# Patient Record
Sex: Male | Born: 1955 | Race: White | Hispanic: No | Marital: Married | Smoking: Former smoker
Health system: Southern US, Community
[De-identification: ages and names within clinical notes are randomized; demographics above are authoritative.]

## PROBLEM LIST (undated history)

## (undated) DIAGNOSIS — G4730 Sleep apnea, unspecified: Secondary | ICD-10-CM

## (undated) DIAGNOSIS — K7030 Alcoholic cirrhosis of liver without ascites: Secondary | ICD-10-CM

## (undated) DIAGNOSIS — E785 Hyperlipidemia, unspecified: Secondary | ICD-10-CM

## (undated) DIAGNOSIS — T8859XA Other complications of anesthesia, initial encounter: Secondary | ICD-10-CM

## (undated) DIAGNOSIS — J449 Chronic obstructive pulmonary disease, unspecified: Secondary | ICD-10-CM

## (undated) DIAGNOSIS — I10 Essential (primary) hypertension: Secondary | ICD-10-CM

## (undated) DIAGNOSIS — I739 Peripheral vascular disease, unspecified: Secondary | ICD-10-CM

## (undated) DIAGNOSIS — R7303 Prediabetes: Secondary | ICD-10-CM

## (undated) DIAGNOSIS — I509 Heart failure, unspecified: Secondary | ICD-10-CM

## (undated) DIAGNOSIS — M1A00X0 Idiopathic chronic gout, unspecified site, without tophus (tophi): Secondary | ICD-10-CM

## (undated) DIAGNOSIS — E790 Hyperuricemia without signs of inflammatory arthritis and tophaceous disease: Secondary | ICD-10-CM

## (undated) DIAGNOSIS — D689 Coagulation defect, unspecified: Secondary | ICD-10-CM

## (undated) DIAGNOSIS — M1A9XX0 Chronic gout, unspecified, without tophus (tophi): Secondary | ICD-10-CM

## (undated) DIAGNOSIS — D649 Anemia, unspecified: Secondary | ICD-10-CM

## (undated) DIAGNOSIS — N183 Chronic kidney disease, stage 3 unspecified: Secondary | ICD-10-CM

## (undated) DIAGNOSIS — H269 Unspecified cataract: Secondary | ICD-10-CM

## (undated) DIAGNOSIS — C61 Malignant neoplasm of prostate: Secondary | ICD-10-CM

## (undated) HISTORY — DX: Anemia, unspecified: D64.9

## (undated) HISTORY — DX: Coagulation defect, unspecified: D68.9

## (undated) HISTORY — DX: Peripheral vascular disease, unspecified: I73.9

## (undated) HISTORY — PX: OTHER SURGICAL HISTORY: SHX169

## (undated) HISTORY — PX: ORIF ANKLE FRACTURE: SUR919

## (undated) HISTORY — PX: CARDIAC CATHETERIZATION: SHX172

## (undated) HISTORY — DX: Unspecified cataract: H26.9

## (undated) HISTORY — PX: COLONOSCOPY: SHX174

## (undated) HISTORY — PX: EYE SURGERY: SHX253

## (undated) HISTORY — DX: Malignant neoplasm of prostate: C61

---

## 2001-11-07 ENCOUNTER — Emergency Department (HOSPITAL_COMMUNITY): Admission: EM | Admit: 2001-11-07 | Discharge: 2001-11-07 | Payer: Self-pay

## 2004-08-11 ENCOUNTER — Ambulatory Visit: Payer: Self-pay | Admitting: Anesthesiology

## 2004-08-29 ENCOUNTER — Ambulatory Visit: Payer: Self-pay | Admitting: Anesthesiology

## 2004-09-29 ENCOUNTER — Ambulatory Visit: Payer: Self-pay | Admitting: Anesthesiology

## 2004-10-27 ENCOUNTER — Ambulatory Visit: Payer: Self-pay | Admitting: Anesthesiology

## 2004-11-27 ENCOUNTER — Ambulatory Visit: Payer: Self-pay | Admitting: Anesthesiology

## 2007-12-28 ENCOUNTER — Ambulatory Visit: Payer: Self-pay | Admitting: Oncology

## 2008-01-01 ENCOUNTER — Ambulatory Visit: Payer: Self-pay | Admitting: Oncology

## 2008-01-28 ENCOUNTER — Ambulatory Visit: Payer: Self-pay | Admitting: Oncology

## 2008-08-18 ENCOUNTER — Ambulatory Visit: Payer: Self-pay | Admitting: Oncology

## 2008-08-29 ENCOUNTER — Ambulatory Visit: Payer: Self-pay | Admitting: Oncology

## 2015-07-15 ENCOUNTER — Other Ambulatory Visit: Payer: Self-pay | Admitting: Family Medicine

## 2016-08-29 DIAGNOSIS — Z86010 Personal history of colonic polyps: Secondary | ICD-10-CM | POA: Insufficient documentation

## 2017-06-28 ENCOUNTER — Encounter: Payer: Self-pay | Admitting: *Deleted

## 2017-06-28 ENCOUNTER — Emergency Department: Payer: Medicare Other

## 2017-06-28 ENCOUNTER — Emergency Department
Admission: EM | Admit: 2017-06-28 | Discharge: 2017-06-28 | Disposition: A | Discharge status: Home / Self Care | Payer: Medicare Other | Attending: Emergency Medicine | Admitting: Emergency Medicine

## 2017-06-28 DIAGNOSIS — J449 Chronic obstructive pulmonary disease, unspecified: Secondary | ICD-10-CM | POA: Insufficient documentation

## 2017-06-28 DIAGNOSIS — R55 Syncope and collapse: Secondary | ICD-10-CM

## 2017-06-28 DIAGNOSIS — I509 Heart failure, unspecified: Secondary | ICD-10-CM | POA: Insufficient documentation

## 2017-06-28 HISTORY — DX: Essential (primary) hypertension: I10

## 2017-06-28 HISTORY — DX: Heart failure, unspecified: I50.9

## 2017-06-28 HISTORY — DX: Chronic obstructive pulmonary disease, unspecified: J44.9

## 2017-06-28 LAB — CBC WITH DIFFERENTIAL/PLATELET
BASOS PCT: 1 %
Basophils Absolute: 0.1 10*3/uL (ref 0–0.1)
EOS ABS: 0.2 10*3/uL (ref 0–0.7)
EOS PCT: 3 %
HCT: 36.1 % — ABNORMAL LOW (ref 40.0–52.0)
HEMOGLOBIN: 12.4 g/dL — AB (ref 13.0–18.0)
LYMPHS ABS: 1.9 10*3/uL (ref 1.0–3.6)
Lymphocytes Relative: 21 %
MCH: 35 pg — AB (ref 26.0–34.0)
MCHC: 34.5 g/dL (ref 32.0–36.0)
MCV: 101.6 fL — ABNORMAL HIGH (ref 80.0–100.0)
MONO ABS: 0.8 10*3/uL (ref 0.2–1.0)
MONOS PCT: 9 %
NEUTROS PCT: 66 %
Neutro Abs: 6 10*3/uL (ref 1.4–6.5)
PLATELETS: 223 10*3/uL (ref 150–440)
RBC: 3.55 MIL/uL — ABNORMAL LOW (ref 4.40–5.90)
RDW: 13.6 % (ref 11.5–14.5)
WBC: 8.9 10*3/uL (ref 3.8–10.6)

## 2017-06-28 LAB — COMPREHENSIVE METABOLIC PANEL
ALBUMIN: 3.4 g/dL — AB (ref 3.5–5.0)
ALK PHOS: 48 U/L (ref 38–126)
ALT: 13 U/L — ABNORMAL LOW (ref 17–63)
ANION GAP: 6 (ref 5–15)
AST: 22 U/L (ref 15–41)
BUN: 19 mg/dL (ref 6–20)
CALCIUM: 9 mg/dL (ref 8.9–10.3)
CHLORIDE: 104 mmol/L (ref 101–111)
CO2: 28 mmol/L (ref 22–32)
Creatinine, Ser: 1.6 mg/dL — ABNORMAL HIGH (ref 0.61–1.24)
GFR calc non Af Amer: 46 mL/min — ABNORMAL LOW (ref >60)
GFR, EST AFRICAN AMERICAN: 53 mL/min — AB (ref >60)
GLUCOSE: 102 mg/dL — AB (ref 65–99)
POTASSIUM: 3.8 mmol/L (ref 3.5–5.1)
SODIUM: 138 mmol/L (ref 135–145)
Total Bilirubin: 0.7 mg/dL (ref 0.3–1.2)
Total Protein: 7.7 g/dL (ref 6.5–8.1)

## 2017-06-28 LAB — BLOOD GAS, VENOUS
Acid-Base Excess: 3.8 mmol/L — ABNORMAL HIGH (ref 0.0–2.0)
BICARBONATE: 29.7 mmol/L — AB (ref 20.0–28.0)
O2 SAT: 60.9 %
PATIENT TEMPERATURE: 37
PCO2 VEN: 49 mmHg (ref 44.0–60.0)
PO2 VEN: 32 mmHg (ref 32.0–45.0)
pH, Ven: 7.39 (ref 7.250–7.430)

## 2017-06-28 LAB — BRAIN NATRIURETIC PEPTIDE: B NATRIURETIC PEPTIDE 5: 70 pg/mL (ref 0.0–100.0)

## 2017-06-28 LAB — TROPONIN I: Troponin I: <0.03 ng/mL (ref <0.03)

## 2017-06-28 MED ORDER — LORAZEPAM 1 MG PO TABS: 1.0000 mg | ORAL_TABLET | Freq: Two times a day (BID) | ORAL | 0 refills | Status: DC

## 2017-06-28 NOTE — ED Notes (Signed)
ED Provider at bedside. 

## 2017-06-28 NOTE — ED Notes (Signed)
Patient ambulated with this nurse down around the nurse's station while monitoring oxygen saturation.  Patient ambulated with steady gait, O2 sat maintained 100% RA, and heart rate 79.

## 2017-06-28 NOTE — ED Provider Notes (Signed)
[facility] Emergency Department Provider Note       Time seen: ----------------------------------------- 6:17 PM on 06/28/2017 -----------------------------------------     I have reviewed the triage vital signs and the nursing notes.   HISTORY   Chief Complaint No chief complaint on file.    HPI Kevin Gross is a 61 y.o. male with a history of CHF and COPD who presents to the ED for near syncope today.  Patient states he was sitting in his when he began feeling near syncopal.  Patient states she has not had any recent illness, no recent changes in his medications or other complaints.  He has not had this happen before, nothing makes it better or worse.  Vital signs are unremarkable in route.  No past medical history on file.  There are no active problems to display for this patient.   No past surgical history on file.  Allergies Patient has no allergy information on record.  Social History Social History  Substance Use Topics  . Smoking status: Not on file  . Smokeless tobacco: Not on file  . Alcohol use Not on file    Review of Systems Constitutional: Negative for fever. Cardiovascular: Negative for chest pain. Respiratory: Negative for shortness of breath. Gastrointestinal: Negative for abdominal pain, vomiting and diarrhea. Genitourinary: Negative for dysuria. Musculoskeletal: Negative for back pain. Skin: Negative for rash. Neurological: Negative for headaches, positive for weakness  All systems negative/normal/unremarkable except as stated in the HPI  ____________________________________________   PHYSICAL EXAM:  VITAL SIGNS: ED Triage Vitals  Enc Vitals Group     BP      Pulse      Resp      Temp      Temp src      SpO2      Weight      Height      Head Circumference      Peak Flow      Pain Score      Pain Loc      Pain Edu?      Excl. in [place]?     Constitutional: Alert and oriented.  Anxious, mild  distress Eyes: Conjunctivae are normal. Normal extraocular movements. ENT   Head: Normocephalic and atraumatic.   Nose: No congestion/rhinnorhea.   Mouth/Throat: Mucous membranes are moist.   Neck: No stridor. Cardiovascular: Normal rate, regular rhythm. No murmurs, rubs, or gallops. Respiratory: Normal respiratory effort without tachypnea nor retractions. Breath sounds are clear and equal bilaterally. No wheezes/rales/rhonchi. Gastrointestinal: Soft and nontender. Normal bowel sounds Musculoskeletal: Nontender with normal range of motion in extremities. No lower extremity tenderness nor edema. Neurologic:  Normal speech and language. No gross focal neurologic deficits are appreciated.  Skin:  Skin is warm, dry and intact. No rash noted. Psychiatric: Mood and affect are normal. Speech and behavior are normal.  ____________________________________________  EKG: Interpreted by me.  Sinus rhythm rate of 63 bpm, normal PR interval, normal QRS, normal QT.  Baseline artifact  ____________________________________________  ED COURSE:  Pertinent labs & imaging results that were available during my care of the patient were reviewed by me and considered in my medical decision making (see chart for details). Patient presents for near-syncope, we will assess with labs and imaging as indicated.   Procedures ____________________________________________   LABS (pertinent positives/negatives)  Labs Reviewed  CBC WITH DIFFERENTIAL/PLATELET - Abnormal; Notable for the following:       Result Value   RBC 3.55 (*)  Hemoglobin 12.4 (*)    HCT 36.1 (*)    MCV 101.6 (*)    MCH 35.0 (*)    All other components within normal limits  COMPREHENSIVE METABOLIC PANEL - Abnormal; Notable for the following:    Glucose, Bld 102 (*)    Creatinine, Ser 1.60 (*)    Albumin 3.4 (*)    ALT 13 (*)    GFR calc non Af Amer 46 (*)    GFR calc Af Amer 53 (*)    All other components within normal  limits  BLOOD GAS, VENOUS - Abnormal; Notable for the following:    Bicarbonate 29.7 (*)    Acid-Base Excess 3.8 (*)    All other components within normal limits  BRAIN NATRIURETIC PEPTIDE  TROPONIN I  CBG MONITORING, ED    RADIOLOGY  chest x-ray IMPRESSION: No acute cardiopulmonary disease.  ____________________________________________  DIFFERENTIAL DIAGNOSIS   Arrhythmia, pneumonia, COPD, CHF, hypercarbic respiratory failure  FINAL ASSESSMENT AND PLAN  Near syncope   Plan: Patient had presented for near syncope of uncertain etiology. Patients labs did indicate a slightly elevated creatinine. Patients imaging was not concerning.  Currently states he feels better.  This may have in fact been anxiety related.  Overall he is asymptomatic and I will prescribe some Ativan to try as needed.  He is stable for outpatient follow-up.   Earleen Newport, MD   Note: This note was generated in part or whole with voice recognition software. Voice recognition is usually quite accurate but there are transcription errors that can and very often do occur. I apologize for any typographical errors that were not detected and corrected.     Earleen Newport, MD 06/28/17 [phone]

## 2017-11-29 ENCOUNTER — Other Ambulatory Visit: Payer: Self-pay | Admitting: Family

## 2017-11-29 ENCOUNTER — Telehealth: Payer: Self-pay | Admitting: *Deleted

## 2017-11-29 DIAGNOSIS — R062 Wheezing: Secondary | ICD-10-CM

## 2017-11-29 DIAGNOSIS — Z87891 Personal history of nicotine dependence: Secondary | ICD-10-CM

## 2017-11-29 DIAGNOSIS — R0600 Dyspnea, unspecified: Secondary | ICD-10-CM

## 2017-11-29 DIAGNOSIS — Z122 Encounter for screening for malignant neoplasm of respiratory organs: Secondary | ICD-10-CM

## 2017-11-29 NOTE — Telephone Encounter (Signed)
Received referral for initial lung cancer screening scan. Contacted patient and obtained smoking history,(former, quit 2016, 47 pack year) as well as answering questions related to screening process. Patient denies signs of lung cancer such as weight loss or hemoptysis. Patient denies comorbidity that would prevent curative treatment if lung cancer were found. Patient is scheduled for shared decision making visit and CT scan on 12/12/17.

## 2017-12-12 ENCOUNTER — Inpatient Hospital Stay: Payer: Medicare PPO | Attending: Oncology | Admitting: Oncology

## 2017-12-12 ENCOUNTER — Ambulatory Visit
Admission: RE | Admit: 2017-12-12 | Discharge: 2017-12-12 | Disposition: A | Discharge status: Home / Self Care | Payer: Medicare PPO | Source: Ambulatory Visit | Attending: Oncology | Admitting: Oncology

## 2017-12-12 ENCOUNTER — Encounter: Payer: Self-pay | Admitting: Oncology

## 2017-12-12 DIAGNOSIS — Z87891 Personal history of nicotine dependence: Secondary | ICD-10-CM

## 2017-12-12 DIAGNOSIS — Z122 Encounter for screening for malignant neoplasm of respiratory organs: Secondary | ICD-10-CM | POA: Insufficient documentation

## 2017-12-12 DIAGNOSIS — I700 Atherosclerosis of aorta: Secondary | ICD-10-CM | POA: Insufficient documentation

## 2017-12-12 DIAGNOSIS — I2510 Atherosclerotic heart disease of native coronary artery without angina pectoris: Secondary | ICD-10-CM | POA: Insufficient documentation

## 2017-12-12 DIAGNOSIS — J432 Centrilobular emphysema: Secondary | ICD-10-CM | POA: Insufficient documentation

## 2017-12-12 NOTE — Progress Notes (Signed)
In accordance with CMS guidelines, patient has met eligibility criteria including age, absence of signs or symptoms of lung cancer.  Social History   Tobacco Use  . Smoking status: Former Smoker    Packs/day: 1.00    Years: 47.00    Pack years: 47.00    Last attempt to quit: 2016    Years since quitting: 3.2  . Smokeless tobacco: Never Used  Substance Use Topics  . Alcohol use: Yes    Comment: occasionally  . Drug use: No     A shared decision-making session was conducted prior to the performance of CT scan. This includes one or more decision aids, includes benefits and harms of screening, follow-up diagnostic testing, over-diagnosis, false positive rate, and total radiation exposure.  Counseling on the importance of adherence to annual lung cancer LDCT screening, impact of co-morbidities, and ability or willingness to undergo diagnosis and treatment is imperative for compliance of the program.  Counseling on the importance of continued smoking cessation for former smokers; the importance of smoking cessation for current smokers, and information about tobacco cessation interventions have been given to patient including [place] and 1800 quit [place] programs.  Written order for lung cancer screening with LDCT has been given to the patient and any and all questions have been answered to the best of my abilities.   Yearly follow up will be coordinated by Burgess Estelle, Thoracic Navigator.  Faythe Casa, NP 12/12/2017 1:02 PM

## 2017-12-15 ENCOUNTER — Encounter: Payer: Self-pay | Admitting: *Deleted

## 2018-01-16 ENCOUNTER — Encounter: Payer: Self-pay | Admitting: *Deleted

## 2018-01-17 ENCOUNTER — Encounter
Admission: RE | Disposition: A | Discharge status: Home / Self Care | Payer: Self-pay | Source: Ambulatory Visit | Attending: Internal Medicine

## 2018-01-17 ENCOUNTER — Other Ambulatory Visit: Payer: Self-pay

## 2018-01-17 ENCOUNTER — Ambulatory Visit
Admission: RE | Admit: 2018-01-17 | Discharge: 2018-01-17 | Disposition: A | Discharge status: Home / Self Care | Payer: Medicare PPO | Source: Ambulatory Visit | Attending: Internal Medicine | Admitting: Internal Medicine

## 2018-01-17 ENCOUNTER — Encounter: Payer: Self-pay | Admitting: *Deleted

## 2018-01-17 ENCOUNTER — Ambulatory Visit: Payer: Medicare PPO | Admitting: Anesthesiology

## 2018-01-17 DIAGNOSIS — K3189 Other diseases of stomach and duodenum: Secondary | ICD-10-CM | POA: Diagnosis not present

## 2018-01-17 DIAGNOSIS — D123 Benign neoplasm of transverse colon: Secondary | ICD-10-CM | POA: Diagnosis not present

## 2018-01-17 DIAGNOSIS — Z86010 Personal history of colonic polyps: Secondary | ICD-10-CM | POA: Insufficient documentation

## 2018-01-17 DIAGNOSIS — D125 Benign neoplasm of sigmoid colon: Secondary | ICD-10-CM | POA: Insufficient documentation

## 2018-01-17 DIAGNOSIS — D120 Benign neoplasm of cecum: Secondary | ICD-10-CM | POA: Diagnosis not present

## 2018-01-17 DIAGNOSIS — I110 Hypertensive heart disease with heart failure: Secondary | ICD-10-CM | POA: Diagnosis not present

## 2018-01-17 DIAGNOSIS — E785 Hyperlipidemia, unspecified: Secondary | ICD-10-CM | POA: Diagnosis not present

## 2018-01-17 DIAGNOSIS — K640 First degree hemorrhoids: Secondary | ICD-10-CM | POA: Insufficient documentation

## 2018-01-17 DIAGNOSIS — Z79899 Other long term (current) drug therapy: Secondary | ICD-10-CM | POA: Insufficient documentation

## 2018-01-17 DIAGNOSIS — Z1211 Encounter for screening for malignant neoplasm of colon: Secondary | ICD-10-CM | POA: Insufficient documentation

## 2018-01-17 DIAGNOSIS — Z7982 Long term (current) use of aspirin: Secondary | ICD-10-CM | POA: Diagnosis not present

## 2018-01-17 DIAGNOSIS — J449 Chronic obstructive pulmonary disease, unspecified: Secondary | ICD-10-CM | POA: Insufficient documentation

## 2018-01-17 DIAGNOSIS — I509 Heart failure, unspecified: Secondary | ICD-10-CM | POA: Insufficient documentation

## 2018-01-17 DIAGNOSIS — K269 Duodenal ulcer, unspecified as acute or chronic, without hemorrhage or perforation: Secondary | ICD-10-CM | POA: Diagnosis not present

## 2018-01-17 DIAGNOSIS — R1310 Dysphagia, unspecified: Secondary | ICD-10-CM | POA: Insufficient documentation

## 2018-01-17 DIAGNOSIS — Z6841 Body Mass Index (BMI) 40.0 and over, adult: Secondary | ICD-10-CM | POA: Diagnosis not present

## 2018-01-17 DIAGNOSIS — K5730 Diverticulosis of large intestine without perforation or abscess without bleeding: Secondary | ICD-10-CM | POA: Insufficient documentation

## 2018-01-17 HISTORY — PX: COLONOSCOPY WITH PROPOFOL: SHX5780

## 2018-01-17 HISTORY — DX: Hyperlipidemia, unspecified: E78.5

## 2018-01-17 HISTORY — PX: ESOPHAGOGASTRODUODENOSCOPY (EGD) WITH PROPOFOL: SHX5813

## 2018-01-17 SURGERY — COLONOSCOPY WITH PROPOFOL
Anesthesia: General

## 2018-01-17 MED ORDER — PROPOFOL 500 MG/50ML IV EMUL
INTRAVENOUS | Status: AC
  Filled 2018-01-17: qty 50

## 2018-01-17 MED ORDER — PROPOFOL 500 MG/50ML IV EMUL
INTRAVENOUS | Status: DC | PRN
  Administered 2018-01-17: 100 ug/kg/min via INTRAVENOUS

## 2018-01-17 MED ORDER — ONDANSETRON HCL 4 MG/2ML IJ SOLN
INTRAMUSCULAR | Status: DC | PRN
  Administered 2018-01-17: 4 mg via INTRAVENOUS

## 2018-01-17 MED ORDER — GLYCOPYRROLATE 0.2 MG/ML IJ SOLN
INTRAMUSCULAR | Status: AC
  Filled 2018-01-17: qty 1

## 2018-01-17 MED ORDER — PROPOFOL 10 MG/ML IV BOLUS
INTRAVENOUS | Status: AC
  Filled 2018-01-17: qty 20

## 2018-01-17 MED ORDER — SODIUM CHLORIDE 0.9 % IV SOLN
INTRAVENOUS | Status: DC
  Administered 2018-01-17: 10:00:00 via INTRAVENOUS

## 2018-01-17 MED ORDER — LIDOCAINE HCL (CARDIAC) PF 100 MG/5ML IV SOSY
PREFILLED_SYRINGE | INTRAVENOUS | Status: DC | PRN
  Administered 2018-01-17: 50 mg via INTRAVENOUS

## 2018-01-17 MED ORDER — FENTANYL CITRATE (PF) 100 MCG/2ML IJ SOLN
INTRAMUSCULAR | Status: AC
  Filled 2018-01-17: qty 2

## 2018-01-17 MED ORDER — GLYCOPYRROLATE 0.2 MG/ML IJ SOLN
INTRAMUSCULAR | Status: DC | PRN
  Administered 2018-01-17 (×2): 0.1 mg via INTRAVENOUS

## 2018-01-17 MED ORDER — FENTANYL CITRATE (PF) 100 MCG/2ML IJ SOLN
INTRAMUSCULAR | Status: DC | PRN
  Administered 2018-01-17 (×2): 50 ug via INTRAVENOUS

## 2018-01-17 MED ORDER — PHENYLEPHRINE HCL 10 MG/ML IJ SOLN
INTRAMUSCULAR | Status: DC | PRN
  Administered 2018-01-17 (×3): 200 ug via INTRAVENOUS

## 2018-01-17 MED ORDER — PROPOFOL 10 MG/ML IV BOLUS
INTRAVENOUS | Status: DC | PRN
  Administered 2018-01-17 (×3): 50 mg via INTRAVENOUS

## 2018-01-17 MED ORDER — LIDOCAINE HCL (PF) 2 % IJ SOLN
INTRAMUSCULAR | Status: AC
  Filled 2018-01-17: qty 10

## 2018-01-17 NOTE — Op Note (Signed)
[facility]astroenterology Patient Name: Kevin Gross Procedure Date: 01/17/2018 10:41 AM MRN: [id number] Account #: [id number] Date of Birth: 1956/01/31 Admit Type: Outpatient Age: 62 Room: [facility] ENDO ROOM 2 Gender: Male Note Status: Finalized Procedure:            Colonoscopy Indications:          High risk colon cancer surveillance: Personal history                        of colonic polyps Providers:            Benay Pike. Alice Reichert MD, MD Referring MD:         Park Breed. Poindexter, Technician (Referring MD) Medicines:            Propofol per Anesthesia Complications:        No immediate complications. Procedure:            Pre-Anesthesia Assessment:                       - The risks and benefits of the procedure and the                        sedation options and risks were discussed with the                        patient. All questions were answered and informed                        consent was obtained.                       - Patient identification and proposed procedure were                        verified prior to the procedure by the nurse. The                        procedure was verified in the procedure room.                       - ASA Grade Assessment: III - A patient with severe                        systemic disease.                       - After reviewing the risks and benefits, the patient                        was deemed in satisfactory condition to undergo the                        procedure.                       After obtaining informed consent, the colonoscope was                        passed under direct vision. Throughout the procedure,  the patient's blood pressure, pulse, and oxygen                        saturations were monitored continuously. The                        Colonoscope was introduced through the anus and                        advanced to the the cecum, identified by appendiceal              orifice and ileocecal valve. The colonoscopy was                        performed without difficulty. The patient tolerated the                        procedure well. The quality of the bowel preparation                        was adequate to identify polyps 6 mm and larger in                        size. The ileocecal valve, appendiceal orifice, and                        rectum were photographed. Findings:      The perianal and digital rectal examinations were normal. Pertinent       negatives include normal sphincter tone and no palpable rectal lesions.      Many small-mouthed diverticula were found in the sigmoid colon.      A 3 mm polyp was found in the cecum. The polyp was sessile. The polyp       was removed with a jumbo cold forceps. Resection and retrieval were       complete.      A 4 mm polyp was found in the distal transverse colon. The polyp was       sessile. The polyp was removed with a jumbo cold forceps. Resection and       retrieval were complete.      A 3 mm polyp was found in the sigmoid colon. The polyp was sessile. The       polyp was removed with a jumbo cold forceps. Resection and retrieval       were complete.      Non-bleeding internal hemorrhoids were found during retroflexion. The       hemorrhoids were Grade I (internal hemorrhoids that do not prolapse).      The exam was otherwise without abnormality. Impression:           - Diverticulosis in the sigmoid colon.                       - One 3 mm polyp in the cecum, removed with a jumbo                        cold forceps. Resected and retrieved.                       - One 4 mm polyp in the distal transverse colon,  removed with a jumbo cold forceps. Resected and                        retrieved.                       - One 3 mm polyp in the sigmoid colon, removed with a                        jumbo cold forceps. Resected and retrieved.                       - Non-bleeding  internal hemorrhoids.                       - The examination was otherwise normal. Recommendation:       - Patient has a contact number available for                        emergencies. The signs and symptoms of potential                        delayed complications were discussed with the patient.                        Return to normal activities tomorrow. Written discharge                        instructions were provided to the patient.                       - Resume previous diet.                       - Continue present medications.                       - Repeat colonoscopy is recommended for surveillance.                        The colonoscopy date will be determined after pathology                        results from today's exam become available for review.                       - Return to GI office PRN.                       - The findings and recommendations were discussed with                        the patient and their spouse. Procedure Code(s):    --- Professional ---                       [phone], Colonoscopy, flexible; with biopsy, single or                        multiple Diagnosis Code(s):    --- Professional ---  K57.30, Diverticulosis of large intestine without                        perforation or abscess without bleeding                       D12.5, Benign neoplasm of sigmoid colon                       D12.3, Benign neoplasm of transverse colon (hepatic                        flexure or splenic flexure)                       D12.0, Benign neoplasm of cecum                       K64.0, First degree hemorrhoids                       Z86.010, Personal history of colonic polyps CPT copyright 2017 American Medical Association. All rights reserved. The codes documented in this report are preliminary and upon coder review may  be revised to meet current compliance requirements. Efrain Sella MD, MD 01/17/2018 11:21:01 AM This report has been  signed electronically. Number of Addenda: 0 Note Initiated On: 01/17/2018 10:41 AM Scope Withdrawal Time: 0 hours 9 minutes 46 seconds  Total Procedure Duration: 0 hours 13 minutes 24 seconds       [facility]

## 2018-01-17 NOTE — Transfer of Care (Signed)
Immediate Anesthesia Transfer of Care Note  Patient: Kevin Gross  Procedure(s) Performed: COLONOSCOPY WITH PROPOFOL (N/A ) ESOPHAGOGASTRODUODENOSCOPY (EGD) WITH PROPOFOL (N/A )  Patient Location: PACU and Endoscopy Unit  Anesthesia Type:General  Level of Consciousness: awake, alert , oriented and patient cooperative  Airway & Oxygen Therapy: Patient Spontanous Breathing  Post-op Assessment: Report given to RN, Post -op Vital signs reviewed and stable and Patient moving all extremities  Post vital signs: Reviewed and stable  Last Vitals:  Vitals Value Taken Time  BP 90/55 01/17/2018 11:23 AM  Temp 36.6 C 01/17/2018 11:21 AM  Pulse 83 01/17/2018 11:23 AM  Resp 14 01/17/2018 11:23 AM  SpO2 95 % 01/17/2018 11:23 AM  Vitals shown include unvalidated device data.  Last Pain:  Vitals:   01/17/18 1121  TempSrc: Tympanic  PainSc:          Complications: No apparent anesthesia complications

## 2018-01-17 NOTE — H&P (Signed)
Outpatient short stay form Pre-procedure 01/17/2018 10:42 AM Kevin Gross, M.D.  Primary Physician: Kevin Gross, M.D.  Reason for visit:  Dysphagia, personal history of colon polyps  History of present illness:  Patient's pleasant 62 year old male with a personal history of colon polyps on colonoscopy May 2018. He apparently had about 20 polyps removed it was diagnosed presumptively with a "polyposis syndrome" or some sort. Patient denies any rectal bleeding or weight loss. He has intermittent pill dysphagia without true food or liquid dysphagia. No significant heartburn.    Current Facility-Administered Medications:  .  0.9 %  sodium chloride infusion, , Intravenous, Continuous, Carnot-Moon, Benay Pike, MD, Last Rate: 20 mL/hr at 01/17/18 1610  Medications Prior to Admission  Medication Sig Dispense Refill Last Dose  . aspirin EC 81 MG tablet Take 81 mg by mouth daily.   01/16/2018 at 0800  . atenolol-chlorthalidone (TENORETIC) 50-25 MG tablet Take 1 tablet by mouth daily.   01/17/2018 at 0700  . lisinopril (PRINIVIL,ZESTRIL) 10 MG tablet Take 10 mg by mouth daily.   01/17/2018 at 0700  . simvastatin (ZOCOR) 40 MG tablet Take 40 mg by mouth daily.   01/17/2018 at 700  . zolpidem (AMBIEN) 10 MG tablet Take 10 mg by mouth at bedtime as needed for sleep.   01/16/2018 at 2200  . albuterol (PROVENTIL HFA;VENTOLIN HFA) 108 (90 Base) MCG/ACT inhaler Inhale into the lungs every 6 (six) hours as needed for wheezing or shortness of breath.   Not Taking at Unknown time  . LORazepam (ATIVAN) 1 MG tablet Take 1 tablet (1 mg total) by mouth 2 (two) times daily. (Patient not taking: Reported on 01/17/2018) 20 tablet 0 Not Taking at Unknown time     No Known Allergies   Past Medical History:  Diagnosis Date  . CHF (congestive heart failure) ([place])   . COPD (chronic obstructive pulmonary disease) ([place])   . Hyperlipidemia   . Hypertension     Review of systems:   Otherwise negative.   Physical  Exam  Gen: Alert, oriented. Appears stated age.  HEENT: [place]/AT. PERRLA. Lungs: CTA, no wheezes. CV: RR nl S1, S2. Abd: soft, benign, no masses. BS+ Ext: No edema. Pulses 2+    Planned procedures: Proceed with EGD and colonoscopy. The patient understands the nature of the planned procedure, indications, risks, alternatives and potential complications including but not limited to bleeding, infection, perforation, damage to internal organs and possible oversedation/side effects from anesthesia. The patient agrees and gives consent to proceed.  Please refer to procedure notes for findings, recommendations and patient disposition/instructions.    Kevin Gross, M.D. Gastroenterology 01/17/2018  10:42 AM

## 2018-01-17 NOTE — Anesthesia Preprocedure Evaluation (Addendum)
Anesthesia Evaluation  Patient identified by MRN, date of birth, ID band Patient awake    Reviewed: Allergy & Precautions, H&P , NPO status , Patient's Chart, lab work & pertinent test results, reviewed documented beta blocker date and time   History of Anesthesia Complications Negative for: history of anesthetic complications  Airway Mallampati: III  TM Distance: >3 FB Neck ROM: full    Dental  (+) Dental Advidsory Given, Loose, Poor Dentition   Pulmonary shortness of breath and with exertion, sleep apnea and Continuous Positive Airway Pressure Ventilation , COPD, neg recent URI, former smoker,           Cardiovascular Exercise Tolerance: Good hypertension, (-) angina+CHF  (-) CAD, (-) Past MI, (-) Cardiac Stents and (-) CABG negative cardio ROS  (-) dysrhythmias (-) Valvular Problems/Murmurs     Neuro/Psych negative neurological ROS  negative psych ROS   GI/Hepatic negative GI ROS, Neg liver ROS,   Endo/Other  neg diabetesMorbid obesity  Renal/GU negative Renal ROS  negative genitourinary   Musculoskeletal   Abdominal   Peds  Hematology negative hematology ROS (+)   Anesthesia Other Findings Past Medical History: No date: CHF (congestive heart failure) (HCC) No date: COPD (chronic obstructive pulmonary disease) (HCC) No date: Hyperlipidemia No date: Hypertension   Reproductive/Obstetrics negative OB ROS                            Anesthesia Physical Anesthesia Plan  ASA: III  Anesthesia Plan: General   Post-op Pain Management:    Induction: Intravenous  PONV Risk Score and Plan: 2 and Propofol infusion  Airway Management Planned: Nasal Cannula  Additional Equipment:   Intra-op Plan:   Post-operative Plan:   Informed Consent: I have reviewed the patients History and Physical, chart, labs and discussed the procedure including the risks, benefits and alternatives for the  proposed anesthesia with the patient or authorized representative who has indicated his/her understanding and acceptance.   Dental Advisory Given  Plan Discussed with: Anesthesiologist, CRNA and Surgeon  Anesthesia Plan Comments:         Anesthesia Quick Evaluation

## 2018-01-17 NOTE — Anesthesia Post-op Follow-up Note (Signed)
Anesthesia QCDR form completed.        

## 2018-01-17 NOTE — Anesthesia Postprocedure Evaluation (Signed)
Anesthesia Post Note  Patient: Kevin Gross  Procedure(s) Performed: COLONOSCOPY WITH PROPOFOL (N/A ) ESOPHAGOGASTRODUODENOSCOPY (EGD) WITH PROPOFOL (N/A )  Patient location during evaluation: Endoscopy Anesthesia Type: General Level of consciousness: awake and alert, oriented and patient cooperative Pain management: satisfactory to patient Vital Signs Assessment: post-procedure vital signs reviewed and stable Respiratory status: spontaneous breathing and respiratory function stable Cardiovascular status: blood pressure returned to baseline and stable Postop Assessment: no headache, no backache, patient able to bend at knees, no apparent nausea or vomiting, adequate PO intake and able to ambulate Anesthetic complications: no     Last Vitals:  Vitals:   01/17/18 0932 01/17/18 1121  BP: (!) 124/97   Pulse: (!) 50   Resp: 20   Temp: (!) 36.1 C 36.6 C  SpO2: 99%     Last Pain:  Vitals:   01/17/18 1121  TempSrc: Tympanic  PainSc:                  [name]

## 2018-01-17 NOTE — Op Note (Signed)
[facility] Patient Name: Laiden Milles Procedure Date: 01/17/2018 10:42 AM MRN: [id number] Account #: [id number] Date of Birth: 04-May-1956 Admit Type: Outpatient Age: 63 Room: [facility] ENDO ROOM 2 Gender: Male Note Status: Finalized Procedure:            Upper GI endoscopy Indications:          Dysphagia Providers:            Benay Pike. Alice Reichert MD, MD Referring MD:         Park Breed. Poindexter, Technician (Referring MD) Medicines:            Propofol per Anesthesia Complications:        No immediate complications. Procedure:            Pre-Anesthesia Assessment:                       - The risks and benefits of the procedure and the                        sedation options and risks were discussed with the                        patient. All questions were answered and informed                        consent was obtained.                       - Patient identification and proposed procedure were                        verified prior to the procedure by the nurse. The                        procedure was verified in the procedure room.                       - ASA Grade Assessment: III - A patient with severe                        systemic disease.                       - After reviewing the risks and benefits, the patient                        was deemed in satisfactory condition to undergo the                        procedure.                       After obtaining informed consent, the endoscope was                        passed under direct vision. Throughout the procedure,                        the patient's blood pressure, pulse, and oxygen  saturations were monitored continuously. The Endoscope                        was introduced through the mouth, and advanced to the                        third part of duodenum. The upper GI endoscopy was                        accomplished without difficulty. The patient tolerated                         the procedure well. Findings:      The examined esophagus was normal.      There is no endoscopic evidence of stenosis, stricture, ulcerations or       mass in the entire esophagus.      Patchy moderate inflammation characterized by congestion (edema),       erosions and erythema was found in the gastric antrum. Biopsies were       taken with a cold forceps for Helicobacter pylori testing.      Two non-bleeding superficial duodenal ulcers with no stigmata of       bleeding were found in the third portion of the duodenum. The largest       lesion was 4 mm in largest dimension.      The exam was otherwise without abnormality. Impression:           - Normal esophagus.                       - Chronic gastritis. Biopsied.                       - Multiple non-bleeding duodenal ulcers with no                        stigmata of bleeding.                       - The examination was otherwise normal. Recommendation:       - Await pathology results.                       - Use Protonix (pantoprazole) 40 mg PO daily.                       - Proceed with colonoscopy Procedure Code(s):    --- Professional ---                       [phone], Esophagogastroduodenoscopy, flexible, transoral;                        with biopsy, single or multiple Diagnosis Code(s):    --- Professional ---                       K29.50, Unspecified chronic gastritis without bleeding                       K26.9, Duodenal ulcer, unspecified as acute or chronic,  without hemorrhage or perforation                       R13.10, Dysphagia, unspecified CPT copyright 2017 American Medical Association. All rights reserved. The codes documented in this report are preliminary and upon coder review may  be revised to meet current compliance requirements. Efrain Sella MD, MD 01/17/2018 11:01:17 AM This report has been signed electronically. Number of Addenda: 0 Note Initiated On:  01/17/2018 10:42 AM      [facility]

## 2018-01-17 NOTE — Interval H&P Note (Signed)
History and Physical Interval Note:  01/17/2018 10:43 AM  Kevin Gross  has presented today for surgery, with the diagnosis of DYSPHAGIA PH COLON POLYPS  The various methods of treatment have been discussed with the patient and family. After consideration of risks, benefits and other options for treatment, the patient has consented to  Procedure(s): COLONOSCOPY WITH PROPOFOL (N/A) ESOPHAGOGASTRODUODENOSCOPY (EGD) WITH PROPOFOL (N/A) as a surgical intervention .  The patient's history has been reviewed, patient examined, no change in status, stable for surgery.  I have reviewed the patient's chart and labs.  Questions were answered to the patient's satisfaction.     [place], [place]

## 2018-01-18 LAB — SURGICAL PATHOLOGY

## 2018-01-19 ENCOUNTER — Encounter: Payer: Self-pay | Admitting: Internal Medicine

## 2018-04-08 DIAGNOSIS — N183 Chronic kidney disease, stage 3 unspecified: Secondary | ICD-10-CM | POA: Insufficient documentation

## 2018-04-08 DIAGNOSIS — N179 Acute kidney failure, unspecified: Secondary | ICD-10-CM | POA: Insufficient documentation

## 2018-04-08 DIAGNOSIS — N1832 Chronic kidney disease, stage 3b: Secondary | ICD-10-CM | POA: Insufficient documentation

## 2018-04-08 DIAGNOSIS — I2510 Atherosclerotic heart disease of native coronary artery without angina pectoris: Secondary | ICD-10-CM | POA: Insufficient documentation

## 2018-04-08 DIAGNOSIS — J449 Chronic obstructive pulmonary disease, unspecified: Secondary | ICD-10-CM | POA: Insufficient documentation

## 2018-04-08 NOTE — Progress Notes (Signed)
Cardiology Office Note  Date:  04/10/2018   ID:  Draycen, Leichter Nov 28, 1955, MRN [id number]  PCP:  [name], NP   Chief Complaint  Patient presents with  . other    CAD c/o sob . Meds reviewed verbally with pt.    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  Mild chronic SOB with exertion With heavy exertion, sometimes has to stop and recover For years as had chronic stable shortness of breath No chest pain Uses proair No regular exercise program  CPA at night for OSA  Lab work reviewed Creatinine 1.7 BUN 33, On chlorthalidone No lipid panel available  EKG personally reviewed by myself on todays visit Shows normal sinus rhythm with rate 68 bpm APCs no significant ST-T wave changes  Total cholesterol 169 LDL 69  PMH:   has a past medical history of CHF (congestive heart failure) ([place]), Clotting disorder ([place]), COPD (chronic obstructive pulmonary disease) ([place]), Hyperlipidemia, and Hypertension.  PSH:    Past Surgical History:  Procedure Laterality Date  . CARDIAC CATHETERIZATION     [facility]  . COLONOSCOPY    . COLONOSCOPY WITH PROPOFOL N/A 01/17/2018   Procedure: COLONOSCOPY WITH PROPOFOL;  Surgeon: Toledo, Benay Pike, MD;  Location: [facility] ENDOSCOPY;  Service: Gastroenterology;  Laterality: N/A;  . ESOPHAGOGASTRODUODENOSCOPY (EGD) WITH PROPOFOL N/A 01/17/2018   Procedure:  ESOPHAGOGASTRODUODENOSCOPY (EGD) WITH PROPOFOL;  Surgeon: Toledo, Benay Pike, MD;  Location: [facility] ENDOSCOPY;  Service: Gastroenterology;  Laterality: N/A;  . ORIF ANKLE FRACTURE Right     Current Outpatient Medications  Medication Sig Dispense Refill  . albuterol (PROVENTIL HFA;VENTOLIN HFA) 108 (90 Base) MCG/ACT inhaler Inhale into the lungs every 6 (six) hours as needed for wheezing or shortness of breath.    Marland Kitchen aspirin EC 81 MG tablet Take 81 mg by mouth daily.    Marland Kitchen atenolol-chlorthalidone (TENORETIC) 50-25 MG tablet Take 1 tablet by mouth daily.    Marland Kitchen lisinopril (PRINIVIL,ZESTRIL) 10 MG tablet Take 10 mg by mouth daily.    . simvastatin (ZOCOR) 40 MG tablet Take 40 mg by mouth daily.    Marland Kitchen zolpidem (AMBIEN) 10 MG tablet Take 10 mg by mouth at bedtime as needed for sleep.    Marland Kitchen ezetimibe (ZETIA) 10 MG tablet Take 1 tablet (10 mg total) by mouth daily. 90 tablet 3   No current facility-administered medications for this visit.      Allergies:   Patient has no known allergies.   Social History:  The patient  reports that he quit smoking about 3 years ago. He has a 47.00 pack-year smoking history. He has never used smokeless tobacco. He reports that he drinks alcohol. He reports that he does not use drugs.   Family History:   family history includes Heart disease in his brother and mother.    Review of Systems: Review of Systems  Constitutional: Negative.   Respiratory: Positive for shortness of breath.   Cardiovascular: Negative.   Gastrointestinal: Negative.   Musculoskeletal:  Positive for back pain and joint pain.  Neurological: Negative.   Psychiatric/Behavioral: Negative.   All other systems reviewed and are negative.    PHYSICAL EXAM: VS:  BP 117/64 (BP Location: Right Arm, Patient Position: Sitting, Cuff Size: Large)   Pulse 68   Ht 5\' 10"  (1.778 m)   Wt (!) 312 lb 8 oz (141.7 kg)   BMI 44.84 kg/m  , BMI Body mass index is 44.84 kg/m. GEN: Well nourished, well  developed, in no acute distress  HEENT: normal  Neck: no JVD, carotid bruits, or masses Cardiac: RRR; no murmurs, rubs, or gallops,no edema  Respiratory:  clear to auscultation bilaterally, normal work of breathing GI: soft, nontender, nondistended, + BS MS: no deformity or atrophy  Skin: warm and dry, no rash Neuro:  Strength and sensation are intact Psych: euthymic mood, full affect    Recent Labs: 06/28/2017: ALT 13; B Natriuretic Peptide 70.0; BUN 19; Creatinine, Ser 1.60; Hemoglobin 12.4; Platelets 223; Potassium 3.8; Sodium 138    Lipid Panel No results found for: CHOL, HDL, LDLCALC, TRIG    Wt Readings from Last 3 Encounters:  04/10/18 (!) 312 lb 8 oz (141.7 kg)  01/17/18 299 lb (135.6 kg)  12/12/17 299 lb (135.6 kg)       ASSESSMENT AND PLAN:  Aortic atherosclerosis (HCC) CT scan images pulled up showing mild aortic atherosclerosis He quit smoking 3 years ago he is a nondiabetic Goal LDL less than 70 total cholesterol less than 150 Recommended weight loss, start Zetia 10 mg daily  Coronary artery disease of native artery of native heart with stable angina pectoris ([place]) - Plan: EKG 12-Lead Denies any anginal symptoms Chronic stable shortness of breath likely from deconditioning and COPD Add Zetia above Recommended if he has any anginal symptoms that he call our office for pharmacologic Myoview Long discussion concerning symptoms to watch for  Centrilobular emphysema ([place]) uses pro-air, symptoms are stable  CRI (chronic renal insufficiency), stage 3 (moderate) ([place]) Possibly exacerbated by chlorthalidone Recommended he increase his fluid intake  Leg edema Chronic issue , use to wear compression hose Likely dependent edema, not secondary to CHF  Disposition:   F/U as needed   Total encounter time more than 60 minutes  Greater than 50% was spent in counseling and coordination of care with the patient    Orders Placed This Encounter  Procedures  .  EKG 12-Lead     Signed, Esmond Plants, M.D., Ph.D. 04/10/2018  [place], [place]

## 2018-04-10 ENCOUNTER — Encounter: Payer: Self-pay | Admitting: Cardiovascular Disease

## 2018-04-10 ENCOUNTER — Encounter

## 2018-04-10 ENCOUNTER — Ambulatory Visit (INDEPENDENT_AMBULATORY_CARE_PROVIDER_SITE_OTHER): Payer: Medicare PPO | Admitting: Cardiovascular Disease

## 2018-04-10 VITALS — BP 117/64 | HR 68 | Ht 70.0 in | Wt 312.5 lb

## 2018-04-10 DIAGNOSIS — N183 Chronic kidney disease, stage 3 unspecified: Secondary | ICD-10-CM

## 2018-04-10 DIAGNOSIS — E782 Mixed hyperlipidemia: Secondary | ICD-10-CM

## 2018-04-10 DIAGNOSIS — J432 Centrilobular emphysema: Secondary | ICD-10-CM | POA: Diagnosis not present

## 2018-04-10 DIAGNOSIS — I25118 Atherosclerotic heart disease of native coronary artery with other forms of angina pectoris: Secondary | ICD-10-CM | POA: Diagnosis not present

## 2018-04-10 DIAGNOSIS — I700 Atherosclerosis of aorta: Secondary | ICD-10-CM | POA: Insufficient documentation

## 2018-04-10 MED ORDER — EZETIMIBE 10 MG PO TABS: 10.0000 mg | ORAL_TABLET | Freq: Every day | ORAL | 3 refills | Status: DC

## 2018-04-10 NOTE — Patient Instructions (Addendum)
Medication Instructions:   Please start zetia one a day for cholesterol  Labwork:  No new labs needed  Testing/Procedures:  No further testing at this time   Follow-Up: It was a pleasure seeing you in the office today. Please call us if you have new issues that need to be addressed before your next appt.  [phone]  Your physician wants you to follow-up in: 12 months.  You will receive a reminder letter in the mail two months in advance. If you don't receive a letter, please call our office to schedule the follow-up appointment.  If you need a refill on your cardiac medications before your next appointment, please call your pharmacy.  For educational health videos Log in to : [email or web address] Or : [email or web address], password : [email or web address]  

## 2018-07-24 ENCOUNTER — Other Ambulatory Visit: Payer: Self-pay | Admitting: Gastroenterology

## 2018-07-24 DIAGNOSIS — K7460 Unspecified cirrhosis of liver: Secondary | ICD-10-CM

## 2018-09-25 ENCOUNTER — Other Ambulatory Visit: Payer: Self-pay | Admitting: Gastroenterology

## 2018-09-25 DIAGNOSIS — K7460 Unspecified cirrhosis of liver: Secondary | ICD-10-CM

## 2018-10-02 ENCOUNTER — Ambulatory Visit
Admission: RE | Admit: 2018-10-02 | Discharge: 2018-10-02 | Disposition: A | Discharge status: Home / Self Care | Payer: Medicare PPO | Source: Ambulatory Visit | Attending: Gastroenterology | Admitting: Gastroenterology

## 2018-10-02 ENCOUNTER — Ambulatory Visit: Payer: Medicare PPO

## 2018-10-02 DIAGNOSIS — K7460 Unspecified cirrhosis of liver: Secondary | ICD-10-CM | POA: Diagnosis present

## 2018-11-18 ENCOUNTER — Encounter: Payer: Self-pay | Admitting: *Deleted

## 2018-11-22 ENCOUNTER — Encounter: Payer: Self-pay | Admitting: *Deleted

## 2019-01-30 ENCOUNTER — Telehealth: Payer: Self-pay | Admitting: *Deleted

## 2019-01-30 DIAGNOSIS — Z122 Encounter for screening for malignant neoplasm of respiratory organs: Secondary | ICD-10-CM

## 2019-01-30 DIAGNOSIS — Z87891 Personal history of nicotine dependence: Secondary | ICD-10-CM

## 2019-01-30 NOTE — Telephone Encounter (Signed)
Patient has been notified that annual lung cancer screening low dose CT scan is due currently or will be in near future. Confirmed that patient is within the age range of 55-77, and asymptomatic, (no signs or symptoms of lung cancer). Patient denies illness that would prevent curative treatment for lung cancer if found. Verified smoking history, (former, quit 2016, 47 pack year). The shared decision making visit was done 12/12/17. Patient is agreeable for CT scan being scheduled.

## 2019-02-04 ENCOUNTER — Other Ambulatory Visit: Payer: Self-pay

## 2019-02-04 ENCOUNTER — Ambulatory Visit
Admission: RE | Admit: 2019-02-04 | Discharge: 2019-02-04 | Disposition: A | Discharge status: Home / Self Care | Payer: Medicare PPO | Source: Ambulatory Visit | Attending: Nurse Practitioner | Admitting: Nurse Practitioner

## 2019-02-04 DIAGNOSIS — Z122 Encounter for screening for malignant neoplasm of respiratory organs: Secondary | ICD-10-CM | POA: Insufficient documentation

## 2019-02-04 DIAGNOSIS — Z87891 Personal history of nicotine dependence: Secondary | ICD-10-CM | POA: Diagnosis present

## 2019-02-05 ENCOUNTER — Ambulatory Visit: Payer: Medicare PPO

## 2019-02-06 ENCOUNTER — Encounter: Payer: Self-pay | Admitting: *Deleted

## 2019-02-17 ENCOUNTER — Emergency Department: Payer: Medicare PPO

## 2019-02-17 ENCOUNTER — Other Ambulatory Visit: Payer: Self-pay

## 2019-02-17 DIAGNOSIS — M879 Osteonecrosis, unspecified: Secondary | ICD-10-CM | POA: Diagnosis present

## 2019-02-17 DIAGNOSIS — F1020 Alcohol dependence, uncomplicated: Secondary | ICD-10-CM | POA: Diagnosis present

## 2019-02-17 DIAGNOSIS — Z1159 Encounter for screening for other viral diseases: Secondary | ICD-10-CM

## 2019-02-17 DIAGNOSIS — L03116 Cellulitis of left lower limb: Principal | ICD-10-CM | POA: Diagnosis present

## 2019-02-17 DIAGNOSIS — Z79899 Other long term (current) drug therapy: Secondary | ICD-10-CM

## 2019-02-17 DIAGNOSIS — M79605 Pain in left leg: Secondary | ICD-10-CM | POA: Diagnosis not present

## 2019-02-17 DIAGNOSIS — N183 Chronic kidney disease, stage 3 (moderate): Secondary | ICD-10-CM | POA: Diagnosis present

## 2019-02-17 DIAGNOSIS — K7030 Alcoholic cirrhosis of liver without ascites: Secondary | ICD-10-CM | POA: Diagnosis present

## 2019-02-17 DIAGNOSIS — M659 Synovitis and tenosynovitis, unspecified: Secondary | ICD-10-CM | POA: Diagnosis present

## 2019-02-17 DIAGNOSIS — Z87891 Personal history of nicotine dependence: Secondary | ICD-10-CM

## 2019-02-17 DIAGNOSIS — I5032 Chronic diastolic (congestive) heart failure: Secondary | ICD-10-CM | POA: Diagnosis present

## 2019-02-17 DIAGNOSIS — J449 Chronic obstructive pulmonary disease, unspecified: Secondary | ICD-10-CM | POA: Diagnosis present

## 2019-02-17 DIAGNOSIS — I130 Hypertensive heart and chronic kidney disease with heart failure and stage 1 through stage 4 chronic kidney disease, or unspecified chronic kidney disease: Secondary | ICD-10-CM | POA: Diagnosis present

## 2019-02-17 DIAGNOSIS — Z8249 Family history of ischemic heart disease and other diseases of the circulatory system: Secondary | ICD-10-CM

## 2019-02-17 DIAGNOSIS — Z6841 Body Mass Index (BMI) 40.0 and over, adult: Secondary | ICD-10-CM

## 2019-02-17 DIAGNOSIS — E785 Hyperlipidemia, unspecified: Secondary | ICD-10-CM | POA: Diagnosis present

## 2019-02-17 DIAGNOSIS — Z86718 Personal history of other venous thrombosis and embolism: Secondary | ICD-10-CM

## 2019-02-17 DIAGNOSIS — Z7982 Long term (current) use of aspirin: Secondary | ICD-10-CM

## 2019-02-17 DIAGNOSIS — M109 Gout, unspecified: Secondary | ICD-10-CM | POA: Diagnosis present

## 2019-02-17 LAB — COMPREHENSIVE METABOLIC PANEL
ALT: 28 U/L (ref 0–44)
AST: 28 U/L (ref 15–41)
Albumin: 3.6 g/dL (ref 3.5–5.0)
Alkaline Phosphatase: 46 U/L (ref 38–126)
Anion gap: 10 (ref 5–15)
BUN: 30 mg/dL — ABNORMAL HIGH (ref 8–23)
CO2: 26 mmol/L (ref 22–32)
Calcium: 9 mg/dL (ref 8.9–10.3)
Chloride: 103 mmol/L (ref 98–111)
Creatinine, Ser: 1.64 mg/dL — ABNORMAL HIGH (ref 0.61–1.24)
GFR calc Af Amer: 52 mL/min — ABNORMAL LOW (ref >60)
GFR calc non Af Amer: 44 mL/min — ABNORMAL LOW (ref >60)
Glucose, Bld: 103 mg/dL — ABNORMAL HIGH (ref 70–99)
Potassium: 4.4 mmol/L (ref 3.5–5.1)
Sodium: 139 mmol/L (ref 135–145)
Total Bilirubin: 1 mg/dL (ref 0.3–1.2)
Total Protein: 8.1 g/dL (ref 6.5–8.1)

## 2019-02-17 LAB — CBC WITH DIFFERENTIAL/PLATELET
Abs Immature Granulocytes: 0.03 10*3/uL (ref 0.00–0.07)
Basophils Absolute: 0 10*3/uL (ref 0.0–0.1)
Basophils Relative: 0 %
Eosinophils Absolute: 0.2 10*3/uL (ref 0.0–0.5)
Eosinophils Relative: 2 %
HCT: 38.9 % — ABNORMAL LOW (ref 39.0–52.0)
Hemoglobin: 12.5 g/dL — ABNORMAL LOW (ref 13.0–17.0)
Immature Granulocytes: 0 %
Lymphocytes Relative: 16 %
Lymphs Abs: 1.8 10*3/uL (ref 0.7–4.0)
MCH: 33.7 pg (ref 26.0–34.0)
MCHC: 32.1 g/dL (ref 30.0–36.0)
MCV: 104.9 fL — ABNORMAL HIGH (ref 80.0–100.0)
Monocytes Absolute: 1 10*3/uL (ref 0.1–1.0)
Monocytes Relative: 9 %
Neutro Abs: 8.1 10*3/uL — ABNORMAL HIGH (ref 1.7–7.7)
Neutrophils Relative %: 73 %
Platelets: 221 10*3/uL (ref 150–400)
RBC: 3.71 MIL/uL — ABNORMAL LOW (ref 4.22–5.81)
RDW: 13 % (ref 11.5–15.5)
WBC: 11.2 10*3/uL — ABNORMAL HIGH (ref 4.0–10.5)
nRBC: 0 % (ref 0.0–0.2)

## 2019-02-17 NOTE — ED Triage Notes (Signed)
Pt with left lower leg pain that began 4 days ago. Pt states began in foot and has progressively gotten worse, redness and swelling noted to left lower leg. 2+ pedal pulse noted.

## 2019-02-18 ENCOUNTER — Inpatient Hospital Stay: Payer: Medicare PPO

## 2019-02-18 ENCOUNTER — Inpatient Hospital Stay
Admission: EM | Admit: 2019-02-18 | Discharge: 2019-02-22 | DRG: 603 | Disposition: A | Discharge status: Home Health | Payer: Medicare PPO | Attending: Internal Medicine | Admitting: Internal Medicine

## 2019-02-18 ENCOUNTER — Emergency Department: Payer: Medicare PPO

## 2019-02-18 DIAGNOSIS — M109 Gout, unspecified: Secondary | ICD-10-CM | POA: Diagnosis present

## 2019-02-18 DIAGNOSIS — M79605 Pain in left leg: Secondary | ICD-10-CM | POA: Diagnosis present

## 2019-02-18 DIAGNOSIS — Z8249 Family history of ischemic heart disease and other diseases of the circulatory system: Secondary | ICD-10-CM | POA: Diagnosis not present

## 2019-02-18 DIAGNOSIS — Z6841 Body Mass Index (BMI) 40.0 and over, adult: Secondary | ICD-10-CM | POA: Diagnosis not present

## 2019-02-18 DIAGNOSIS — I130 Hypertensive heart and chronic kidney disease with heart failure and stage 1 through stage 4 chronic kidney disease, or unspecified chronic kidney disease: Secondary | ICD-10-CM | POA: Diagnosis present

## 2019-02-18 DIAGNOSIS — L03116 Cellulitis of left lower limb: Secondary | ICD-10-CM | POA: Diagnosis present

## 2019-02-18 DIAGNOSIS — Z1159 Encounter for screening for other viral diseases: Secondary | ICD-10-CM | POA: Diagnosis not present

## 2019-02-18 DIAGNOSIS — M659 Synovitis and tenosynovitis, unspecified: Secondary | ICD-10-CM | POA: Diagnosis present

## 2019-02-18 DIAGNOSIS — Z7982 Long term (current) use of aspirin: Secondary | ICD-10-CM | POA: Diagnosis not present

## 2019-02-18 DIAGNOSIS — E785 Hyperlipidemia, unspecified: Secondary | ICD-10-CM | POA: Diagnosis present

## 2019-02-18 DIAGNOSIS — F1020 Alcohol dependence, uncomplicated: Secondary | ICD-10-CM | POA: Diagnosis present

## 2019-02-18 DIAGNOSIS — M879 Osteonecrosis, unspecified: Secondary | ICD-10-CM | POA: Diagnosis present

## 2019-02-18 DIAGNOSIS — Z87891 Personal history of nicotine dependence: Secondary | ICD-10-CM | POA: Diagnosis not present

## 2019-02-18 DIAGNOSIS — J449 Chronic obstructive pulmonary disease, unspecified: Secondary | ICD-10-CM | POA: Diagnosis present

## 2019-02-18 DIAGNOSIS — Z79899 Other long term (current) drug therapy: Secondary | ICD-10-CM | POA: Diagnosis not present

## 2019-02-18 DIAGNOSIS — Z86718 Personal history of other venous thrombosis and embolism: Secondary | ICD-10-CM | POA: Diagnosis not present

## 2019-02-18 DIAGNOSIS — N183 Chronic kidney disease, stage 3 (moderate): Secondary | ICD-10-CM | POA: Diagnosis present

## 2019-02-18 DIAGNOSIS — I5032 Chronic diastolic (congestive) heart failure: Secondary | ICD-10-CM | POA: Diagnosis present

## 2019-02-18 DIAGNOSIS — K7030 Alcoholic cirrhosis of liver without ascites: Secondary | ICD-10-CM | POA: Diagnosis present

## 2019-02-18 LAB — BASIC METABOLIC PANEL
Anion gap: 12 (ref 5–15)
BUN: 40 mg/dL — ABNORMAL HIGH (ref 8–23)
CO2: 24 mmol/L (ref 22–32)
Calcium: 9 mg/dL (ref 8.9–10.3)
Chloride: 103 mmol/L (ref 98–111)
Creatinine, Ser: 2.19 mg/dL — ABNORMAL HIGH (ref 0.61–1.24)
GFR calc Af Amer: 36 mL/min — ABNORMAL LOW (ref >60)
GFR calc non Af Amer: 31 mL/min — ABNORMAL LOW (ref >60)
Glucose, Bld: 99 mg/dL (ref 70–99)
Potassium: 4.1 mmol/L (ref 3.5–5.1)
Sodium: 139 mmol/L (ref 135–145)

## 2019-02-18 LAB — CBC
HCT: 38.3 % — ABNORMAL LOW (ref 39.0–52.0)
Hemoglobin: 12.1 g/dL — ABNORMAL LOW (ref 13.0–17.0)
MCH: 33.5 pg (ref 26.0–34.0)
MCHC: 31.6 g/dL (ref 30.0–36.0)
MCV: 106.1 fL — ABNORMAL HIGH (ref 80.0–100.0)
Platelets: 201 10*3/uL (ref 150–400)
RBC: 3.61 MIL/uL — ABNORMAL LOW (ref 4.22–5.81)
RDW: 13.2 % (ref 11.5–15.5)
WBC: 10.4 10*3/uL (ref 4.0–10.5)
nRBC: 0 % (ref 0.0–0.2)

## 2019-02-18 LAB — SEDIMENTATION RATE: Sed Rate: 87 mm/hr — ABNORMAL HIGH (ref 0–20)

## 2019-02-18 LAB — BRAIN NATRIURETIC PEPTIDE: B Natriuretic Peptide: 58 pg/mL (ref 0.0–100.0)

## 2019-02-18 MED ORDER — EZETIMIBE 10 MG PO TABS
10.0000 mg | ORAL_TABLET | Freq: Every day | ORAL | Status: DC
  Administered 2019-02-18 – 2019-02-22 (×5): 10 mg via ORAL
  Filled 2019-02-18 (×6): qty 1

## 2019-02-18 MED ORDER — FOLIC ACID 1 MG PO TABS
1.0000 mg | ORAL_TABLET | Freq: Every day | ORAL | Status: DC
  Administered 2019-02-18 – 2019-02-22 (×5): 1 mg via ORAL
  Filled 2019-02-18 (×5): qty 1

## 2019-02-18 MED ORDER — ASPIRIN EC 81 MG PO TBEC
81.0000 mg | DELAYED_RELEASE_TABLET | Freq: Every day | ORAL | Status: DC
  Administered 2019-02-18 – 2019-02-22 (×5): 81 mg via ORAL
  Filled 2019-02-18 (×5): qty 1

## 2019-02-18 MED ORDER — VITAMIN B-1 100 MG PO TABS
100.0000 mg | ORAL_TABLET | Freq: Every day | ORAL | Status: DC
  Administered 2019-02-18 – 2019-02-22 (×5): 100 mg via ORAL
  Filled 2019-02-18 (×5): qty 1

## 2019-02-18 MED ORDER — ENOXAPARIN SODIUM 40 MG/0.4ML ~~LOC~~ SOLN
40.0000 mg | SUBCUTANEOUS | Status: DC
  Filled 2019-02-18: qty 0.4

## 2019-02-18 MED ORDER — ACETAMINOPHEN 650 MG RE SUPP: 650.0000 mg | Freq: Four times a day (QID) | RECTAL | Status: DC | PRN

## 2019-02-18 MED ORDER — POLYETHYLENE GLYCOL 3350 17 G PO PACK: 17.0000 g | PACK | Freq: Every day | ORAL | Status: DC | PRN

## 2019-02-18 MED ORDER — ENOXAPARIN SODIUM 40 MG/0.4ML ~~LOC~~ SOLN
40.0000 mg | Freq: Two times a day (BID) | SUBCUTANEOUS | Status: DC
  Administered 2019-02-18 – 2019-02-22 (×9): 40 mg via SUBCUTANEOUS
  Filled 2019-02-18 (×8): qty 0.4

## 2019-02-18 MED ORDER — ACETAMINOPHEN 325 MG PO TABS: 650.0000 mg | ORAL_TABLET | Freq: Four times a day (QID) | ORAL | Status: DC | PRN

## 2019-02-18 MED ORDER — FENTANYL CITRATE (PF) 100 MCG/2ML IJ SOLN
50.0000 ug | Freq: Once | INTRAMUSCULAR | Status: AC
  Administered 2019-02-18: 04:00:00 50 ug via INTRAVENOUS
  Filled 2019-02-18: qty 2

## 2019-02-18 MED ORDER — SIMVASTATIN 20 MG PO TABS
40.0000 mg | ORAL_TABLET | Freq: Every day | ORAL | Status: DC
  Administered 2019-02-18 – 2019-02-20 (×3): 40 mg via ORAL
  Filled 2019-02-18 (×3): qty 2

## 2019-02-18 MED ORDER — ONDANSETRON HCL 4 MG/2ML IJ SOLN: 4.0000 mg | Freq: Four times a day (QID) | INTRAMUSCULAR | Status: DC | PRN

## 2019-02-18 MED ORDER — LISINOPRIL 10 MG PO TABS: 10.0000 mg | ORAL_TABLET | Freq: Every day | ORAL | Status: DC

## 2019-02-18 MED ORDER — HYDROCODONE-ACETAMINOPHEN 5-325 MG PO TABS
1.0000 | ORAL_TABLET | ORAL | Status: DC | PRN
  Administered 2019-02-18 (×2): 1 via ORAL
  Administered 2019-02-18 – 2019-02-22 (×13): 2 via ORAL
  Filled 2019-02-18 (×5): qty 2
  Filled 2019-02-18 (×2): qty 1
  Filled 2019-02-18 (×9): qty 2

## 2019-02-18 MED ORDER — OXYCODONE HCL 5 MG PO TABS
5.0000 mg | ORAL_TABLET | Freq: Once | ORAL | Status: AC
  Administered 2019-02-18: 5 mg via ORAL
  Filled 2019-02-18: qty 1

## 2019-02-18 MED ORDER — ONDANSETRON HCL 4 MG PO TABS: 4.0000 mg | ORAL_TABLET | Freq: Four times a day (QID) | ORAL | Status: DC | PRN

## 2019-02-18 MED ORDER — CLINDAMYCIN PHOSPHATE 600 MG/50ML IV SOLN
600.0000 mg | Freq: Three times a day (TID) | INTRAVENOUS | Status: DC
  Administered 2019-02-18 – 2019-02-20 (×7): 600 mg via INTRAVENOUS
  Filled 2019-02-18 (×10): qty 50

## 2019-02-18 MED ORDER — CHLORTHALIDONE 25 MG PO TABS
25.0000 mg | ORAL_TABLET | Freq: Every day | ORAL | Status: DC
  Administered 2019-02-18 – 2019-02-22 (×5): 25 mg via ORAL
  Filled 2019-02-18 (×6): qty 1

## 2019-02-18 MED ORDER — CLINDAMYCIN HCL 150 MG PO CAPS
300.0000 mg | ORAL_CAPSULE | Freq: Once | ORAL | Status: AC
  Administered 2019-02-18: 300 mg via ORAL
  Filled 2019-02-18: qty 2

## 2019-02-18 MED ORDER — ATENOLOL 50 MG PO TABS
50.0000 mg | ORAL_TABLET | Freq: Every day | ORAL | Status: DC
  Administered 2019-02-18 – 2019-02-22 (×5): 50 mg via ORAL
  Filled 2019-02-18 (×5): qty 1

## 2019-02-18 MED ORDER — SODIUM CHLORIDE 0.9% FLUSH: 3.0000 mL | INTRAVENOUS | Status: DC | PRN

## 2019-02-18 MED ORDER — ATENOLOL-CHLORTHALIDONE 50-25 MG PO TABS: 1.0000 | ORAL_TABLET | Freq: Every day | ORAL | Status: DC

## 2019-02-18 MED ORDER — SODIUM CHLORIDE 0.9% FLUSH
3.0000 mL | Freq: Two times a day (BID) | INTRAVENOUS | Status: DC
  Administered 2019-02-18 – 2019-02-22 (×6): 3 mL via INTRAVENOUS

## 2019-02-18 MED ORDER — SODIUM CHLORIDE 0.9 % IV SOLN
250.0000 mL | INTRAVENOUS | Status: DC | PRN
  Administered 2019-02-18 – 2019-02-19 (×3): 250 mL via INTRAVENOUS

## 2019-02-18 MED ORDER — PANTOPRAZOLE SODIUM 40 MG PO TBEC
40.0000 mg | DELAYED_RELEASE_TABLET | Freq: Every day | ORAL | Status: DC
  Administered 2019-02-18 – 2019-02-22 (×5): 40 mg via ORAL
  Filled 2019-02-18 (×5): qty 1

## 2019-02-18 MED ORDER — CHLORTHALIDONE 25 MG PO TABS: 25.0000 mg | ORAL_TABLET | Freq: Every day | ORAL | Status: DC

## 2019-02-18 MED ORDER — MORPHINE SULFATE (PF) 2 MG/ML IV SOLN: 2.0000 mg | INTRAVENOUS | Status: DC | PRN

## 2019-02-18 MED ORDER — ACETAMINOPHEN 500 MG PO TABS
1000.0000 mg | ORAL_TABLET | Freq: Once | ORAL | Status: AC
  Administered 2019-02-18: 1000 mg via ORAL
  Filled 2019-02-18: qty 2

## 2019-02-18 MED ORDER — SODIUM CHLORIDE 0.9% FLUSH: 3.0000 mL | Freq: Two times a day (BID) | INTRAVENOUS | Status: DC

## 2019-02-18 NOTE — Progress Notes (Signed)
1.  Left lower extremity cellulitis with possible bone infarct - IV antibiotic therapy initiated with clindamycin - Podiatry, Dr. Vickki Muff, consulted for further evaluation of possible bone infarct in the talus present on left ankle x-ray. - We are managing pain with analgesic - Venous Doppler studies are negative for DVT  2.  CKD -With BUN 30 and creatinine 1.64.  We will continue to monitor renal function with repeated BMP in the a.m.  3. cirrhosis of the liver - Secondary to alcoholism -Followed by Dr. Alice Reichert outpatient  4.  Hyperlipidemia - Continue Zocor  5.  Hypertension - Lisinopril continued -We will manage persistent hypertension expectantly  DVT and PPI prophylaxis initiated  Agree with above

## 2019-02-18 NOTE — Progress Notes (Signed)
Anticoagulation monitoring(Lovenox):  63yo  male ordered Lovenox 40 mg Q24h for DVT prevention  Filed Weights   02/17/19 2050  Weight: 292 lb (132.5 kg)   BMI 41.9   Lab Results  Component Value Date   CREATININE 2.19 (H) 02/18/2019   CREATININE 1.64 (H) 02/17/2019   CREATININE 1.60 (H) 06/28/2017   Estimated Creatinine Clearance: 48.5 mL/min (A) (by C-G formula based on SCr of 2.19 mg/dL (H)). Hemoglobin & Hematocrit     Component Value Date/Time   HGB 12.1 (L) 02/18/2019 0909   HCT 38.3 (L) 02/18/2019 0240     Per Protocol for Patient with estCrcl > 30 ml/min and BMI > 40, will transition to Lovenox 40 mg Q12h.     Paulina Fusi, PharmD, BCPS 02/18/2019 10:18 AM

## 2019-02-18 NOTE — H&P (Signed)
[facility] [place] NAME: Kevin Gross    MR#:  [id number]  DATE OF BIRTH:  Jul 28, 1956  DATE OF ADMISSION:  02/18/2019  PRIMARY CARE PHYSICIAN: [name], NP   REQUESTING/REFERRING PHYSICIAN: Rudene Re, MD  CHIEF COMPLAINT:   Chief Complaint  Patient presents with   Leg Pain    HISTORY OF PRESENT ILLNESS:  Kevin Gross  is a 63 y.o. male with a known history of CHF, DVT of right lower extremity, COPD, hyperlipidemia, hypertension, liver cirrhosis.  He presents to the emergency room complaining of erythema, edema, and increasing pain of his left lower extremity, particularly his left ankle.  Symptoms have become worse over the last 4 days.  The pain has become severe with weightbearing to the point the patient reports he is unable to ambulate today.  Patient denies trauma to the area.  He denies fevers, nausea, vomiting, diarrhea, abdominal pain, chest pain, shortness of breath.  Patient has experienced mild chills at times.  Doppler studies of left lower extremity were negative for DVT.  Left ankle x-ray demonstrates possible bone infarct in the talus with diffuse soft tissue edema.  WBC is 11.2.  BUN is 30 with creatinine 1.64.  He was started on IV clindamycin in the emergency room for cellulitis.  We have admitted him to the hospitalist service for further management.  PAST MEDICAL HISTORY:   Past Medical History:  Diagnosis Date   CHF (congestive heart failure) (HCC)    Clotting disorder (HCC)    Bilateral legs   COPD (chronic obstructive pulmonary disease) ([place])    Hyperlipidemia    Hypertension     PAST SURGICAL HISTORY:   Past Surgical History:  Procedure Laterality Date   CARDIAC CATHETERIZATION     [facility]   COLONOSCOPY     COLONOSCOPY WITH PROPOFOL N/A 01/17/2018   Procedure: COLONOSCOPY WITH PROPOFOL;  Surgeon: Toledo, Benay Pike, MD;  Location: [facility] ENDOSCOPY;  Service: Gastroenterology;   Laterality: N/A;   ESOPHAGOGASTRODUODENOSCOPY (EGD) WITH PROPOFOL N/A 01/17/2018   Procedure: ESOPHAGOGASTRODUODENOSCOPY (EGD) WITH PROPOFOL;  Surgeon: Toledo, Benay Pike, MD;  Location: [facility] ENDOSCOPY;  Service: Gastroenterology;  Laterality: N/A;   ORIF ANKLE FRACTURE Right     SOCIAL HISTORY:   Social History   Tobacco Use   Smoking status: Former Smoker    Packs/day: 1.00    Years: 47.00    Pack years: 47.00    Quit date: 2016    Years since quitting: 4.4   Smokeless tobacco: Never Used  Substance Use Topics   Alcohol use: Yes    Comment: occasionally    FAMILY HISTORY:   Family History  Problem Relation Age of Onset   Heart disease Mother    Heart disease Brother     DRUG ALLERGIES:  No Known Allergies  REVIEW OF SYSTEMS:   Review of Systems  Constitutional: Positive for malaise/fatigue. Negative for chills and fever.  HENT: Negative for sinus pain and sore throat.   Eyes: Negative for blurred vision, double vision and pain.  Respiratory: Negative for cough, shortness of breath and wheezing.   Cardiovascular: Negative for chest pain and palpitations.  Gastrointestinal: Negative for abdominal pain, diarrhea, heartburn, nausea and vomiting.  Genitourinary: Negative for dysuria, flank pain and hematuria.  Musculoskeletal: Positive for joint pain (left foot). Negative for back pain, falls and neck pain.  Skin: Negative for itching and rash.       Redness and edema and pain of  Left ankle  Neurological: Negative for dizziness, loss of consciousness, weakness and headaches.  Psychiatric/Behavioral: Negative.  Negative for depression.      MEDICATIONS AT HOME:   Prior to Admission medications   Medication Sig Start Date End Date Taking? Authorizing Provider  albuterol (PROVENTIL HFA;VENTOLIN HFA) 108 (90 Base) MCG/ACT inhaler Inhale into the lungs every 6 (six) hours as needed for wheezing or shortness of breath.    [provider]  aspirin EC 81  MG tablet Take 81 mg by mouth daily.    [provider]  atenolol-chlorthalidone (TENORETIC) 50-25 MG tablet Take 1 tablet by mouth daily.    [provider]  chlorthalidone (HYGROTON) 25 MG tablet Take 1 tablet by mouth daily. 01/31/19   [provider]  ezetimibe (ZETIA) 10 MG tablet Take 1 tablet (10 mg total) by mouth daily. 04/10/18   Minna Merritts, MD  lisinopril (PRINIVIL,ZESTRIL) 10 MG tablet Take 10 mg by mouth daily.    [provider]  simvastatin (ZOCOR) 20 MG tablet Take 1 tablet by mouth daily. 01/22/19   [provider]  simvastatin (ZOCOR) 40 MG tablet Take 40 mg by mouth daily.    [provider]  zolpidem (AMBIEN) 10 MG tablet Take 10 mg by mouth at bedtime as needed for sleep.    [provider]      VITAL SIGNS:  Blood pressure (!) 142/81, pulse 65, temperature 99 F (37.2 C), temperature source Oral, resp. rate 20, height 5\' 10"  (1.778 m), weight 132.5 kg, SpO2 99 %.  PHYSICAL EXAMINATION:  Physical Exam Vitals signs and nursing note reviewed.  Constitutional:      General: He is not in acute distress.    Appearance: He is obese. He is not ill-appearing.  HENT:     Head: Normocephalic.     Right Ear: External ear normal.     Left Ear: External ear normal.     Nose: Nose normal.     Mouth/Throat:     Mouth: Mucous membranes are moist.     Pharynx: Oropharynx is clear.  Eyes:     General: No scleral icterus.    Extraocular Movements: Extraocular movements intact.     Conjunctiva/sclera: Conjunctivae normal.     Pupils: Pupils are equal, round, and reactive to light.  Neck:     Musculoskeletal: Normal range of motion and neck supple.  Cardiovascular:     Rate and Rhythm: Normal rate and regular rhythm.     Pulses: Normal pulses.     Heart sounds: Normal heart sounds. No murmur. No friction rub. No gallop.   Pulmonary:     Effort: Pulmonary effort is normal. No respiratory distress.     Breath  sounds: Normal breath sounds. No wheezing, rhonchi or rales.  Abdominal:     Palpations: Abdomen is soft.     Tenderness: There is no abdominal tenderness. There is no guarding or rebound.  Musculoskeletal:        General: Swelling (Left ankle/foot) and tenderness (Left ankle/foot) present.     Left lower leg: Edema present.  Skin:    General: Skin is warm and dry.     Coloration: Skin is not jaundiced.     Findings: Erythema (left ankle/foot) present. No rash.  Neurological:     General: No focal deficit present.     Mental Status: He is alert and oriented to person, place, and time.  Psychiatric:        Mood and  Affect: Mood normal.        Behavior: Behavior normal.       LABORATORY PANEL:   CBC Recent Labs  Lab 02/17/19 2058  WBC 11.2*  HGB 12.5*  HCT 38.9*  PLT 221   ------------------------------------------------------------------------------------------------------------------  Chemistries  Recent Labs  Lab 02/17/19 2058  NA 139  K 4.4  CL 103  CO2 26  GLUCOSE 103*  BUN 30*  CREATININE 1.64*  CALCIUM 9.0  AST 28  ALT 28  ALKPHOS 46  BILITOT 1.0   ------------------------------------------------------------------------------------------------------------------  Cardiac Enzymes No results for input(s): TROPONINI in the last 168 hours. ------------------------------------------------------------------------------------------------------------------  RADIOLOGY:  Dg Ankle Complete Left  Result Date: 02/18/2019 CLINICAL DATA:  Pain and swelling. Redness. EXAM: LEFT ANKLE COMPLETE - 3+ VIEW COMPARISON:  None. FINDINGS: There is no evidence of fracture, dislocation, or joint effusion. Geographic sclerosis involving the talus may represent a bone infarct. No evidence of subchondral collapse. Probable enthesopathic changes at the medial malleolus. Plantar calcaneal spur. No bony destructive change or periosteal reaction. Diffuse subcutaneous soft tissue  edema. IMPRESSION: 1. Possible bone infarct in the talus. 2. Diffuse soft tissue edema. Electronically Signed   By: Keith Rake M.D.   On: 02/18/2019 03:03   US Venous Img Lower Unilateral Left  Result Date: 02/17/2019 CLINICAL DATA:  Left leg erythema and swelling EXAM: LEFT LOWER EXTREMITY VENOUS DOPPLER ULTRASOUND TECHNIQUE: Gray-scale sonography with graded compression, as well as color Doppler and duplex ultrasound were performed to evaluate the lower extremity deep venous systems from the level of the common femoral vein and including the common femoral, femoral, profunda femoral, popliteal and calf veins including the posterior tibial, peroneal and gastrocnemius veins when visible. The superficial great saphenous vein was also interrogated. Spectral Doppler was utilized to evaluate flow at rest and with distal augmentation maneuvers in the common femoral, femoral and popliteal veins. COMPARISON:  None. FINDINGS: Contralateral Common Femoral Vein: Respiratory phasicity is normal and symmetric with the symptomatic side. No evidence of thrombus. Normal compressibility. Common Femoral Vein: No evidence of thrombus. Normal compressibility, respiratory phasicity and response to augmentation. Saphenofemoral Junction: No evidence of thrombus. Normal compressibility and flow on color Doppler imaging. Profunda Femoral Vein: No evidence of thrombus. Normal compressibility and flow on color Doppler imaging. Femoral Vein: No evidence of thrombus. Normal compressibility, respiratory phasicity and response to augmentation. Popliteal Vein: No evidence of thrombus. Normal compressibility, respiratory phasicity and response to augmentation. Calf Veins: No evidence of thrombus. Normal compressibility and flow on color Doppler imaging. Superficial Great Saphenous Vein: No evidence of thrombus. Normal compressibility. Venous Reflux:  None. Other Findings:  None. IMPRESSION: No evidence of deep venous thrombosis.  Electronically Signed   By: Ulyses Jarred M.D.   On: 02/17/2019 22:31      IMPRESSION AND PLAN:   1.  Left lower extremity cellulitis with possible bone infarct - IV antibiotic therapy initiated with clindamycin - Podiatry, Dr. Vickki Muff, consulted for further evaluation of possible bone infarct in the talus present on left ankle x-ray. - We are managing pain with analgesic - Venous Doppler studies are negative for DVT  2.  CKD -With BUN 30 and creatinine 1.64.  We will continue to monitor renal function with repeated BMP in the a.m.  3. cirrhosis of the liver - Secondary to alcoholism -Followed by Dr. Alice Reichert outpatient  4.  Hyperlipidemia - Continue Zocor  5.  Hypertension - Lisinopril continued -We will manage persistent hypertension expectantly  DVT and PPI prophylaxis initiated  All the records are reviewed and case discussed with ED provider. The plan of care was discussed in details with the patient (and family). I answered all questions. The patient agreed to proceed with the above mentioned plan. Further management will depend upon hospital course.   CODE STATUS: Full code  TOTAL TIME TAKING CARE OF THIS PATIENT: 28minutes.    Theo Dills Alicia Seib CRNPon 02/18/2019 at 4:16 AM  Pager - [phone]  After 6pm go to [email or web address] - Proofreader  [facility]  Office  [phone]  CC: Primary care physician; [name], NP   Note: This dictation was prepared with Dragon dictation along with smaller phrase technology. Any transcriptional errors that result from this process are unintentional.

## 2019-02-18 NOTE — ED Notes (Signed)
MD Veronese at bedside  

## 2019-02-18 NOTE — ED Notes (Signed)
ED TO INPATIENT HANDOFF REPORT  ED Nurse Name and Phone #: Hayli Milligan   S Name/Age/Gender Kevin Gross 63 y.o. male Room/Bed: ED03A/ED03A  Code Status   Code Status: Full Code  Home/SNF/Other Home Patient oriented to: self, place, time and situation Is this baseline? Yes   Triage Complete: Triage complete  Chief Complaint [facility] - Leg pain  Triage Note Pt with left lower leg pain that began 4 days ago. Pt states began in foot and has progressively gotten worse, redness and swelling noted to left lower leg. 2+ pedal pulse noted.    Allergies No Known Allergies  Level of Care/Admitting Diagnosis ED Disposition    ED Disposition Condition [facility] Area: [place] [[id number]]  Level of Care: Med-Surg [16]  Covid Evaluation: Screening Protocol (No Symptoms)  Diagnosis: Cellulitis of left lower extremity [509326]  Admitting Physician: Mayer Camel [[id number]]  Attending Physician: Mayer Camel [[id number]]  Estimated length of stay: past midnight tomorrow  Certification:: I certify this patient will need inpatient services for at least 2 midnights  PT Class (Do Not Modify): Inpatient [101]  PT Acc Code (Do Not Modify): Private [1]       B Medical/Surgery History Past Medical History:  Diagnosis Date  . CHF (congestive heart failure) ([place])   . Clotting disorder (HCC)    Bilateral legs  . COPD (chronic obstructive pulmonary disease) ([place])   . Hyperlipidemia   . Hypertension    Past Surgical History:  Procedure Laterality Date  . CARDIAC CATHETERIZATION     [facility]  . COLONOSCOPY    . COLONOSCOPY WITH PROPOFOL N/A 01/17/2018   Procedure: COLONOSCOPY WITH PROPOFOL;  Surgeon: Toledo, Benay Pike, MD;  Location: [facility] ENDOSCOPY;  Service: Gastroenterology;  Laterality: N/A;  . ESOPHAGOGASTRODUODENOSCOPY (EGD) WITH PROPOFOL N/A 01/17/2018   Procedure: ESOPHAGOGASTRODUODENOSCOPY (EGD) WITH PROPOFOL;  Surgeon: Toledo, Benay Pike, MD;   Location: [facility] ENDOSCOPY;  Service: Gastroenterology;  Laterality: N/A;  . ORIF ANKLE FRACTURE Right      A IV Location/Drains/Wounds Patient Lines/Drains/Airways Status   Active Line/Drains/Airways    Name:   Placement date:   Placement time:   Site:   Days:   Peripheral IV 02/18/19 Left Forearm   02/18/19    0340    Forearm   less than 1   Airway   01/17/18    1048     397          Intake/Output Last 24 hours No intake or output data in the 24 hours ending 02/18/19 0800  Labs/Imaging Results for orders placed or performed during the hospital encounter of 02/18/19 (from the past 48 hour(s))  CBC with Differential     Status: Abnormal   Collection Time: 02/17/19  8:58 PM  Result Value Ref Range   WBC 11.2 (H) 4.0 - 10.5 K/uL   RBC 3.71 (L) 4.22 - 5.81 MIL/uL   Hemoglobin 12.5 (L) 13.0 - 17.0 g/dL   HCT 38.9 (L) 39.0 - 52.0 %   MCV 104.9 (H) 80.0 - 100.0 fL   MCH 33.7 26.0 - 34.0 pg   MCHC 32.1 30.0 - 36.0 g/dL   RDW 13.0 11.5 - 15.5 %   Platelets 221 150 - 400 K/uL   nRBC 0.0 0.0 - 0.2 %   Neutrophils Relative % 73 %   Neutro Abs 8.1 (H) 1.7 - 7.7 K/uL   Lymphocytes Relative 16 %   Lymphs Abs 1.8 0.7 - 4.0  K/uL   Monocytes Relative 9 %   Monocytes Absolute 1.0 0.1 - 1.0 K/uL   Eosinophils Relative 2 %   Eosinophils Absolute 0.2 0.0 - 0.5 K/uL   Basophils Relative 0 %   Basophils Absolute 0.0 0.0 - 0.1 K/uL   Immature Granulocytes 0 %   Abs Immature Granulocytes 0.03 0.00 - 0.07 K/uL    Comment: Performed at [facility], [address] [place], [place] [address]  Comprehensive metabolic panel     Status: Abnormal   Collection Time: 02/17/19  8:58 PM  Result Value Ref Range   Sodium 139 135 - 145 mmol/L   Potassium 4.4 3.5 - 5.1 mmol/L   Chloride 103 98 - 111 mmol/L   CO2 26 22 - 32 mmol/L   Glucose, Bld 103 (H) 70 - 99 mg/dL   BUN 30 (H) 8 - 23 mg/dL   Creatinine, Ser 1.64 (H) 0.61 - 1.24 mg/dL   Calcium 9.0 8.9 - 10.3 mg/dL   Total Protein 8.1 6.5  - 8.1 g/dL   Albumin 3.6 3.5 - 5.0 g/dL   AST 28 15 - 41 U/L   ALT 28 0 - 44 U/L   Alkaline Phosphatase 46 38 - 126 U/L   Total Bilirubin 1.0 0.3 - 1.2 mg/dL   GFR calc non Af Amer 44 (L) >60 mL/min   GFR calc Af Amer 52 (L) >60 mL/min   Anion gap 10 5 - 15    Comment: Performed at [facility] [address], [place], [place] [address]   Dg Ankle Complete Left  Result Date: 02/18/2019 CLINICAL DATA:  Pain and swelling. Redness. EXAM: LEFT ANKLE COMPLETE - 3+ VIEW COMPARISON:  None. FINDINGS: There is no evidence of fracture, dislocation, or joint effusion. Geographic sclerosis involving the talus may represent a bone infarct. No evidence of subchondral collapse. Probable enthesopathic changes at the medial malleolus. Plantar calcaneal spur. No bony destructive change or periosteal reaction. Diffuse subcutaneous soft tissue edema. IMPRESSION: 1. Possible bone infarct in the talus. 2. Diffuse soft tissue edema. Electronically Signed   By: Keith Rake M.D.   On: 02/18/2019 03:03   US Venous Img Lower Unilateral Left  Result Date: 02/17/2019 CLINICAL DATA:  Left leg erythema and swelling EXAM: LEFT LOWER EXTREMITY VENOUS DOPPLER ULTRASOUND TECHNIQUE: Gray-scale sonography with graded compression, as well as color Doppler and duplex ultrasound were performed to evaluate the lower extremity deep venous systems from the level of the common femoral vein and including the common femoral, femoral, profunda femoral, popliteal and calf veins including the posterior tibial, peroneal and gastrocnemius veins when visible. The superficial great saphenous vein was also interrogated. Spectral Doppler was utilized to evaluate flow at rest and with distal augmentation maneuvers in the common femoral, femoral and popliteal veins. COMPARISON:  None. FINDINGS: Contralateral Common Femoral Vein: Respiratory phasicity is normal and symmetric with the symptomatic side. No evidence of thrombus. Normal  compressibility. Common Femoral Vein: No evidence of thrombus. Normal compressibility, respiratory phasicity and response to augmentation. Saphenofemoral Junction: No evidence of thrombus. Normal compressibility and flow on color Doppler imaging. Profunda Femoral Vein: No evidence of thrombus. Normal compressibility and flow on color Doppler imaging. Femoral Vein: No evidence of thrombus. Normal compressibility, respiratory phasicity and response to augmentation. Popliteal Vein: No evidence of thrombus. Normal compressibility, respiratory phasicity and response to augmentation. Calf Veins: No evidence of thrombus. Normal compressibility and flow on color Doppler imaging. Superficial Great Saphenous Vein: No evidence of thrombus. Normal  compressibility. Venous Reflux:  None. Other Findings:  None. IMPRESSION: No evidence of deep venous thrombosis. Electronically Signed   By: Ulyses Jarred M.D.   On: 02/17/2019 22:31    Pending Labs Unresulted Labs (From admission, onward)    Start     Ordered   02/25/19 0500  Creatinine, serum  (enoxaparin (LOVENOX)    CrCl >/= 30 ml/min)  Weekly,   STAT    Comments: while on enoxaparin therapy    02/18/19 0432   02/18/19 2229  Basic metabolic panel  Tomorrow morning,   STAT     02/18/19 0432   02/18/19 0500  CBC  Tomorrow morning,   STAT     02/18/19 0432   02/18/19 0432  HIV antibody (Routine Testing)  Once,   STAT     02/18/19 0432   02/18/19 0432  CBC  (enoxaparin (LOVENOX)    CrCl >/= 30 ml/min)  Once,   STAT    Comments: Baseline for enoxaparin therapy IF NOT ALREADY DRAWN.  Notify MD if PLT < 100 K.    02/18/19 0432   02/18/19 0432  Creatinine, serum  (enoxaparin (LOVENOX)    CrCl >/= 30 ml/min)  Once,   STAT    Comments: Baseline for enoxaparin therapy IF NOT ALREADY DRAWN.    02/18/19 0432   02/18/19 0432  Brain natriuretic peptide  Once,   STAT     02/18/19 0432   02/18/19 0317  Novel Coronavirus,NAA,(SEND-OUT TO REF LAB - TAT 24-48 hrs); Hosp  Order  (Asymptomatic Patients Labs)  Once,   STAT    Question:  Rule Out  Answer:  Yes   02/18/19 0317          Vitals/Pain Today's Vitals   02/17/19 2050 02/18/19 0218 02/18/19 0245 02/18/19 0536  BP:  (!) 142/81  140/72  Pulse:  65  66  Resp:  20  16  Temp:  99 F (37.2 C)  98.2 F (36.8 C)  TempSrc:  Oral  Oral  SpO2:  99%  99%  Weight: 132.5 kg     Height: 5\' 10"  (1.778 m)     PainSc: 10-Worst pain ever  10-Worst pain ever 7     Isolation Precautions No active isolations  Medications Medications  simvastatin (ZOCOR) tablet 40 mg (has no administration in time range)  ezetimibe (ZETIA) tablet 10 mg (has no administration in time range)  aspirin EC tablet 81 mg (has no administration in time range)  enoxaparin (LOVENOX) injection 40 mg (has no administration in time range)  sodium chloride flush (NS) 0.9 % injection 3 mL (has no administration in time range)  sodium chloride flush (NS) 0.9 % injection 3 mL (has no administration in time range)  0.9 %  sodium chloride infusion (has no administration in time range)  acetaminophen (TYLENOL) tablet 650 mg (has no administration in time range)    Or  acetaminophen (TYLENOL) suppository 650 mg (has no administration in time range)  HYDROcodone-acetaminophen (NORCO/VICODIN) 5-325 MG per tablet 1-2 tablet (has no administration in time range)  polyethylene glycol (MIRALAX / GLYCOLAX) packet 17 g (has no administration in time range)  ondansetron (ZOFRAN) tablet 4 mg (has no administration in time range)    Or  ondansetron (ZOFRAN) injection 4 mg (has no administration in time range)  folic acid (FOLVITE) tablet 1 mg (has no administration in time range)  thiamine (VITAMIN B-1) tablet 100 mg (has no administration in time range)  clindamycin (CLEOCIN) IVPB 600  mg (has no administration in time range)  pantoprazole (PROTONIX) EC tablet 40 mg (has no administration in time range)  atenolol (TENORMIN) tablet 50 mg (has no  administration in time range)    And  chlorthalidone (HYGROTON) tablet 25 mg (has no administration in time range)  acetaminophen (TYLENOL) tablet 1,000 mg (1,000 mg Oral Given 02/18/19 0246)  oxyCODONE (Oxy IR/ROXICODONE) immediate release tablet 5 mg (5 mg Oral Given 02/18/19 0246)  clindamycin (CLEOCIN) capsule 300 mg (300 mg Oral Given 02/18/19 0245)  fentaNYL (SUBLIMAZE) injection 50 mcg (50 mcg Intravenous Given 02/18/19 0344)    Mobility walks Low fall risk   Focused Assessments    R Recommendations: See Admitting Provider Note  Report given to:   Additional Notes:

## 2019-02-18 NOTE — ED Provider Notes (Signed)
[facility] Emergency Department Provider Note  ____________________________________________  Time seen: Approximately 2:45 AM  I have reviewed the triage vital signs and the nursing notes.   HISTORY  Chief Complaint Leg Pain   HPI Kevin Gross is a 63 y.o. male with history of morbidly obesity, CHF, COPD, hypertension, hyperlipidemia, cirrhosis of the liver who presents for evaluation of LLE pain and swelling.  Patient reports progressively worsening swelling, redness, and pain of the left lower extremity x4 days.  The pain is mild at rest and severe with weightbearing.  No fevers but he has had chills.  No trauma.  No prior history of DVT.  Past Medical History:  Diagnosis Date  . CHF (congestive heart failure) ([place])   . Clotting disorder (HCC)    Bilateral legs  . COPD (chronic obstructive pulmonary disease) ([place])   . Hyperlipidemia   . Hypertension     Patient Active Problem List   Diagnosis Date Noted  . Aortic atherosclerosis ([place]) 04/10/2018  . Mixed hyperlipidemia 04/10/2018  . Morbid obesity ([place]) 04/10/2018  . CAD (coronary artery disease), native coronary artery 04/08/2018  . COPD (chronic obstructive pulmonary disease) ([place]) 04/08/2018  . CRI (chronic renal insufficiency), stage 3 (moderate) ([place]) 04/08/2018    Past Surgical History:  Procedure Laterality Date  . CARDIAC CATHETERIZATION     [facility]  . COLONOSCOPY    . COLONOSCOPY WITH PROPOFOL N/A 01/17/2018   Procedure: COLONOSCOPY WITH PROPOFOL;  Surgeon: Toledo, Benay Pike, MD;  Location: [facility] ENDOSCOPY;  Service: Gastroenterology;  Laterality: N/A;  . ESOPHAGOGASTRODUODENOSCOPY (EGD) WITH PROPOFOL N/A 01/17/2018   Procedure: ESOPHAGOGASTRODUODENOSCOPY (EGD) WITH PROPOFOL;  Surgeon: Toledo, Benay Pike, MD;  Location: [facility] ENDOSCOPY;  Service: Gastroenterology;  Laterality: N/A;  . ORIF ANKLE FRACTURE Right     Prior to Admission medications   Medication Sig Start Date End Date  Taking? Authorizing Provider  albuterol (PROVENTIL HFA;VENTOLIN HFA) 108 (90 Base) MCG/ACT inhaler Inhale into the lungs every 6 (six) hours as needed for wheezing or shortness of breath.    [provider]  aspirin EC 81 MG tablet Take 81 mg by mouth daily.    [provider]  atenolol-chlorthalidone (TENORETIC) 50-25 MG tablet Take 1 tablet by mouth daily.    [provider]  chlorthalidone (HYGROTON) 25 MG tablet Take 1 tablet by mouth daily. 01/31/19   [provider]  ezetimibe (ZETIA) 10 MG tablet Take 1 tablet (10 mg total) by mouth daily. 04/10/18   Minna Merritts, MD  lisinopril (PRINIVIL,ZESTRIL) 10 MG tablet Take 10 mg by mouth daily.    [provider]  simvastatin (ZOCOR) 20 MG tablet Take 1 tablet by mouth daily. 01/22/19   [provider]  simvastatin (ZOCOR) 40 MG tablet Take 40 mg by mouth daily.    [provider]  zolpidem (AMBIEN) 10 MG tablet Take 10 mg by mouth at bedtime as needed for sleep.    [provider]    Allergies Patient has no known allergies.  Family History  Problem Relation Age of Onset  . Heart disease Mother   . Heart disease Brother     Social History Social History   Tobacco Use  . Smoking status: Former Smoker    Packs/day: 1.00    Years: 47.00    Pack years: 47.00    Quit date: 2016    Years since quitting: 4.4  . Smokeless tobacco: Never Used  Substance Use Topics  . Alcohol use:  Yes    Comment: occasionally  . Drug use: No    Review of Systems  Constitutional: Negative for fever. Eyes: Negative for visual changes. ENT: Negative for sore throat. Neck: No neck pain  Cardiovascular: Negative for chest pain. Respiratory: Negative for shortness of breath. Gastrointestinal: Negative for abdominal pain, vomiting or diarrhea. Genitourinary: Negative for dysuria. Musculoskeletal: Negative for back pain. + LLE pain and swelling Skin: Negative for rash.  Neurological: Negative for headaches, weakness or numbness. Psych: No SI or HI  ____________________________________________   PHYSICAL EXAM:  VITAL SIGNS: ED Triage Vitals  Enc Vitals Group     BP 02/17/19 2049 (!) 134/58     Pulse Rate 02/17/19 2049 63     Resp 02/17/19 2049 20     Temp 02/17/19 2049 99.6 F (37.6 C)     Temp Source 02/17/19 2049 Oral     SpO2 02/17/19 2049 100 %     Weight 02/17/19 2050 292 lb (132.5 kg)     Height 02/17/19 2050 5\' 10"  (1.778 m)     Head Circumference --      Peak Flow --      Pain Score 02/17/19 2050 10     Pain Loc --      Pain Edu? --      Excl. in [place]? --     Constitutional: Alert and oriented. Well appearing and in no apparent distress. HEENT:      Head: Normocephalic and atraumatic.         Eyes: Conjunctivae are normal. Sclera is non-icteric.       Mouth/Throat: Mucous membranes are moist.       Neck: Supple with no signs of meningismus. Cardiovascular: Regular rate and rhythm. No murmurs, gallops, or rubs.  Respiratory: Normal respiratory effort. Lungs are clear to auscultation bilaterally. No wheezes, crackles, or rhonchi.  Gastrointestinal: Soft, non tender, and non distended with positive bowel sounds. No rebound or guarding. Musculoskeletal: 1+ pitting edema bilaterally with erythema, warmth, and tenderness to palpation over the distal medial LLE.  Neurologic: Normal speech and language. Face is symmetric. Moving all extremities. No gross focal neurologic deficits are appreciated. Skin: Skin is warm, dry and intact. No rash noted. Psychiatric: Mood and affect are normal. Speech and behavior are normal.  ____________________________________________   LABS (all labs ordered are listed, but only abnormal results are displayed)  Labs Reviewed  CBC WITH DIFFERENTIAL/PLATELET - Abnormal; Notable for the following components:      Result Value   WBC 11.2 (*)    RBC 3.71 (*)    Hemoglobin 12.5 (*)    HCT 38.9 (*)    MCV  104.9 (*)    Neutro Abs 8.1 (*)    All other components within normal limits  COMPREHENSIVE METABOLIC PANEL - Abnormal; Notable for the following components:   Glucose, Bld 103 (*)    BUN 30 (*)    Creatinine, Ser 1.64 (*)    GFR calc non Af Amer 44 (*)    GFR calc Af Amer 52 (*)    All other components within normal limits  NOVEL CORONAVIRUS, NAA (HOSPITAL ORDER, SEND-OUT TO REF LAB)   ____________________________________________  EKG  none  ____________________________________________  RADIOLOGY  I have personally reviewed the images performed during this visit and I agree with the Radiologist's read.   Interpretation by Radiologist:  Dg Ankle Complete Left  Result Date: 02/18/2019 CLINICAL DATA:  Pain and swelling. Redness. EXAM: LEFT ANKLE COMPLETE - 3+ VIEW  COMPARISON:  None. FINDINGS: There is no evidence of fracture, dislocation, or joint effusion. Geographic sclerosis involving the talus may represent a bone infarct. No evidence of subchondral collapse. Probable enthesopathic changes at the medial malleolus. Plantar calcaneal spur. No bony destructive change or periosteal reaction. Diffuse subcutaneous soft tissue edema. IMPRESSION: 1. Possible bone infarct in the talus. 2. Diffuse soft tissue edema. Electronically Signed   By: Keith Rake M.D.   On: 02/18/2019 03:03   US Venous Img Lower Unilateral Left  Result Date: 02/17/2019 CLINICAL DATA:  Left leg erythema and swelling EXAM: LEFT LOWER EXTREMITY VENOUS DOPPLER ULTRASOUND TECHNIQUE: Gray-scale sonography with graded compression, as well as color Doppler and duplex ultrasound were performed to evaluate the lower extremity deep venous systems from the level of the common femoral vein and including the common femoral, femoral, profunda femoral, popliteal and calf veins including the posterior tibial, peroneal and gastrocnemius veins when visible. The superficial great saphenous vein was also interrogated. Spectral  Doppler was utilized to evaluate flow at rest and with distal augmentation maneuvers in the common femoral, femoral and popliteal veins. COMPARISON:  None. FINDINGS: Contralateral Common Femoral Vein: Respiratory phasicity is normal and symmetric with the symptomatic side. No evidence of thrombus. Normal compressibility. Common Femoral Vein: No evidence of thrombus. Normal compressibility, respiratory phasicity and response to augmentation. Saphenofemoral Junction: No evidence of thrombus. Normal compressibility and flow on color Doppler imaging. Profunda Femoral Vein: No evidence of thrombus. Normal compressibility and flow on color Doppler imaging. Femoral Vein: No evidence of thrombus. Normal compressibility, respiratory phasicity and response to augmentation. Popliteal Vein: No evidence of thrombus. Normal compressibility, respiratory phasicity and response to augmentation. Calf Veins: No evidence of thrombus. Normal compressibility and flow on color Doppler imaging. Superficial Great Saphenous Vein: No evidence of thrombus. Normal compressibility. Venous Reflux:  None. Other Findings:  None. IMPRESSION: No evidence of deep venous thrombosis. Electronically Signed   By: Ulyses Jarred M.D.   On: 02/17/2019 22:31     ____________________________________________   PROCEDURES  Procedure(s) performed: None Procedures Critical Care performed:  None ____________________________________________   INITIAL IMPRESSION / ASSESSMENT AND PLAN / ED COURSE  63 y.o. male with history of morbidly obesity, CHF, COPD, hypertension, hyperlipidemia, cirrhosis of the liver who presents for evaluation of LLE pain and swelling.  Patient has erythema, warmth, swelling, and tenderness to palpation of the distal left lower extremity concerning for cellulitis.  Bedside ultrasound showing cobblestoning consistent with cellulitis.  No intra-articular fluid seen.  Doppler ultrasound negative for DVT.  XR concerning for talus  infarction. No signs of sepsis. Patient started on clindamycin for cellulitis. Area demarcated. Due to new bone infarction, significant pain impairing patient's mobility and overlying infection, will admit to the Hospitalist.       As part of my medical decision making, I reviewed the following data within the [place] notes reviewed and incorporated, Labs reviewed , Old chart reviewed, Radiograph reviewed , Discussed with admitting physician , Notes from prior ED visits and [place] Controlled Substance Database    Pertinent labs & imaging results that were available during my care of the patient were reviewed by me and considered in my medical decision making (see chart for details).    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    Alfred Levins, [place], MD 02/18/19 [phone]

## 2019-02-18 NOTE — Consult Note (Signed)
[facility]                                                      Patient Demographics  Kevin Gross, is a 63 y.o. male   MRN: [id number]   DOB - 09-Jan-1956  Admit Date - 02/18/2019    Outpatient Primary MD for the patient is [name], NP  Consult requested in the Hospital by [name], MD, On 02/18/2019    Reason for consult cellulitis and pain to the left ankle   With History of -  Past Medical History:  Diagnosis Date  . CHF (congestive heart failure) ([place])   . Clotting disorder (HCC)    Bilateral legs  . COPD (chronic obstructive pulmonary disease) ([place])   . Hyperlipidemia   . Hypertension       Past Surgical History:  Procedure Laterality Date  . CARDIAC CATHETERIZATION     [facility]  . COLONOSCOPY    . COLONOSCOPY WITH PROPOFOL N/A 01/17/2018   Procedure: COLONOSCOPY WITH PROPOFOL;  Surgeon: Toledo, Benay Pike, MD;  Location: [facility] ENDOSCOPY;  Service: Gastroenterology;  Laterality: N/A;  . ESOPHAGOGASTRODUODENOSCOPY (EGD) WITH PROPOFOL N/A 01/17/2018   Procedure: ESOPHAGOGASTRODUODENOSCOPY (EGD) WITH PROPOFOL;  Surgeon: Toledo, Benay Pike, MD;  Location: [facility] ENDOSCOPY;  Service: Gastroenterology;  Laterality: N/A;  . ORIF ANKLE FRACTURE Right     in for   Chief Complaint  Patient presents with  . Leg Pain     HPI  Kevin Gross  is a 63 y.o. male, patient came in yesterday for to the hospital because of increasing redness swelling and pain to the left ankle region.  No history of injury to the region.  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  He states he does not have a history of gout.    Review of Systems    In addition to the HPI above,  No Fever-chills, No Headache, No changes with Vision or hearing, No problems swallowing food or Liquids, No Chest pain, Cough or Shortness of Breath, No Abdominal pain, No  Nausea or Vommitting, Bowel movements are regular, No Blood in stool or Urine, No dysuria, No new skin rashes or bruises, No new joints pains-aches,  No new weakness, tingling, numbness in any extremity, No recent weight gain or loss, No polyuria, polydypsia or polyphagia, No significant Mental Stressors.  A full 10 point Review of Systems was done, except as stated above, all other Review of Systems were negative.   Social History Social History   Tobacco Use  . Smoking status: Former Smoker    Packs/day: 1.00    Years: 47.00    Pack years: 47.00    Quit date: 2016    Years since quitting: 4.4  . Smokeless tobacco: Never Used  Substance Use Topics  . Alcohol use: Yes    Comment: occasionally    Family History Family History  Problem Relation Age of Onset  . Heart disease Mother   . Heart disease Brother     Prior to Admission medications   Medication Sig Start Date End Date Taking? Authorizing Provider  albuterol (PROVENTIL HFA;VENTOLIN HFA) 108 (90 Base) MCG/ACT inhaler Inhale into the lungs every 6 (six) hours as needed for wheezing or shortness of breath.   Yes [provider]  aspirin EC 81 MG  tablet Take 81 mg by mouth daily.   Yes [provider]  atenolol-chlorthalidone (TENORETIC) 50-25 MG tablet Take 1 tablet by mouth daily.   Yes [provider]  chlorthalidone (HYGROTON) 25 MG tablet Take 1 tablet by mouth daily. 01/31/19  Yes [provider]  ezetimibe (ZETIA) 10 MG tablet Take 1 tablet (10 mg total) by mouth daily. 04/10/18  Yes Gollan, Kathlene November, MD  lisinopril (PRINIVIL,ZESTRIL) 10 MG tablet Take 10 mg by mouth daily.   Yes [provider]  simvastatin (ZOCOR) 20 MG tablet Take 10 mg by mouth daily.  01/22/19  Yes [provider]    Anti-infectives (From admission, onward)   Start     Dose/Rate Route Frequency Ordered Stop   02/18/19 1100  clindamycin (CLEOCIN) IVPB 600 mg     600 mg 100 mL/hr over 30  Minutes Intravenous Every 8 hours 02/18/19 0432     02/18/19 0245  clindamycin (CLEOCIN) capsule 300 mg     300 mg Oral  Once 02/18/19 0242 02/18/19 0245      Scheduled Meds: . aspirin EC  81 mg Oral Daily  . atenolol  50 mg Oral Daily   And  . chlorthalidone  25 mg Oral Daily  . enoxaparin (LOVENOX) injection  40 mg Subcutaneous Q12H  . ezetimibe  10 mg Oral Daily  . folic acid  1 mg Oral Daily  . pantoprazole  40 mg Oral Daily  . simvastatin  40 mg Oral Daily  . sodium chloride flush  3 mL Intravenous Q12H  . thiamine  100 mg Oral Daily   Continuous Infusions: . sodium chloride 250 mL (02/18/19 1226)  . clindamycin (CLEOCIN) IV 600 mg (02/18/19 1228)   PRN Meds:.sodium chloride, acetaminophen **OR** acetaminophen, HYDROcodone-acetaminophen, morphine injection, ondansetron **OR** ondansetron (ZOFRAN) IV, polyethylene glycol, sodium chloride flush  No Known Allergies  Physical Exam  Vitals  Blood pressure 118/67, pulse 64, temperature 99.4 F (37.4 C), resp. rate 20, height 5\' 10"  (1.778 m), weight 132.5 kg, SpO2 98 %.  Lower Extremity exam:  Vascular: Palpable bilateral.  Patient does have some venous stasis problems in both feet and legs.  Dermatological: Patient has an area of redness over the medial and lateral ankle of the areas of cellulitis were marked yesterday and overall they appear to be improving from what the marks look like.  I asked the patient if the redness is decreased and he said that yes he thought it was significantly better.  Still however is painful to touch on the area.  Neurological: Dr. Sunday Corn touch sensation seem to be intact.  No gross loss of sensation.  Ortho: Patient has no gross deformities he has some swelling around the ankles but in general some swelling to the lower extremity.  As noted earlier the redness cellulitis seems to be improved today.  He has been elevating it and is on IV antibiotics.  His white count is dropped from 11.2-10.4 is  within normal range.  Data Review  CBC Recent Labs  Lab 02/17/19 2058 02/18/19 0909  WBC 11.2* 10.4  HGB 12.5* 12.1*  HCT 38.9* 38.3*  PLT 221 201  MCV 104.9* 106.1*  MCH 33.7 33.5  MCHC 32.1 31.6  RDW 13.0 13.2  LYMPHSABS 1.8  --   MONOABS 1.0  --   EOSABS 0.2  --   BASOSABS 0.0  --    ------------------------------------------------------------------------------------------------------------------  Chemistries  Recent Labs  Lab 02/17/19 2058 02/18/19 0909  NA 139 139  K 4.4 4.1  CL 103 103  CO2 26 24  GLUCOSE 103* 99  BUN 30* 40*  CREATININE 1.64* 2.19*  CALCIUM 9.0 9.0  AST 28  --   ALT 28  --   ALKPHOS 46  --   BILITOT 1.0  --    ------------------------------------------------------------------------------------------------------------------ estimated creatinine clearance is 48.5 mL/min (A) (by C-G formula based on SCr of 2.19 mg/dL (H)). ------------------------------------------------------------------------------------------------------------------ No results for input(s): TSH, T4TOTAL, T3FREE, THYROIDAB in the last 72 hours.  Invalid input(s): FREET3 Urinalysis No results found for: COLORURINE, APPEARANCEUR, LABSPEC, PHURINE, GLUCOSEU, HGBUR, BILIRUBINUR, KETONESUR, PROTEINUR, UROBILINOGEN, NITRITE, LEUKOCYTESUR   Imaging results:   Dg Ankle Complete Left  Result Date: 02/18/2019 CLINICAL DATA:  Pain and swelling. Redness. EXAM: LEFT ANKLE COMPLETE - 3+ VIEW COMPARISON:  None. FINDINGS: There is no evidence of fracture, dislocation, or joint effusion. Geographic sclerosis involving the talus may represent a bone infarct. No evidence of subchondral collapse. Probable enthesopathic changes at the medial malleolus. Plantar calcaneal spur. No bony destructive change or periosteal reaction. Diffuse subcutaneous soft tissue edema. IMPRESSION: 1. Possible bone infarct in the talus. 2. Diffuse soft tissue edema. Electronically Signed   By: Keith Rake  M.D.   On: 02/18/2019 03:03   US Venous Img Lower Unilateral Left  Result Date: 02/17/2019 CLINICAL DATA:  Left leg erythema and swelling EXAM: LEFT LOWER EXTREMITY VENOUS DOPPLER ULTRASOUND TECHNIQUE: Gray-scale sonography with graded compression, as well as color Doppler and duplex ultrasound were performed to evaluate the lower extremity deep venous systems from the level of the common femoral vein and including the common femoral, femoral, profunda femoral, popliteal and calf veins including the posterior tibial, peroneal and gastrocnemius veins when visible. The superficial great saphenous vein was also interrogated. Spectral Doppler was utilized to evaluate flow at rest and with distal augmentation maneuvers in the common femoral, femoral and popliteal veins. COMPARISON:  None. FINDINGS: Contralateral Common Femoral Vein: Respiratory phasicity is normal and symmetric with the symptomatic side. No evidence of thrombus. Normal compressibility. Common Femoral Vein: No evidence of thrombus. Normal compressibility, respiratory phasicity and response to augmentation. Saphenofemoral Junction: No evidence of thrombus. Normal compressibility and flow on color Doppler imaging. Profunda Femoral Vein: No evidence of thrombus. Normal compressibility and flow on color Doppler imaging. Femoral Vein: No evidence of thrombus. Normal compressibility, respiratory phasicity and response to augmentation. Popliteal Vein: No evidence of thrombus. Normal compressibility, respiratory phasicity and response to augmentation. Calf Veins: No evidence of thrombus. Normal compressibility and flow on color Doppler imaging. Superficial Great Saphenous Vein: No evidence of thrombus. Normal compressibility. Venous Reflux:  None. Other Findings:  None. IMPRESSION: No evidence of deep venous thrombosis. Electronically Signed   By: Ulyses Jarred M.D.   On: 02/17/2019 22:31    Assessment & Plan: Basic x-rays do not show really very much in  the way of pathology because this redness and swelling.  X-rays do not show any type of osteochondral defects they mention something about a possible bone infarct in the body of the talus but it looks just like some increased density and is not visible on the lateral view just a P view.  He does have an MRI ordered I am going to do that and we get a better idea if there is any type of joint involvement.  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  We will follow him  tomorrow and see how the MRI pans out.  Active Problems:   Cellulitis of left lower extremity   Family Communication: Plan discussed with patient   Albertine Patricia M.D on 02/18/2019 at 1:15 PM  Thank you for the consult, we will follow the patient with you in the Hospital.

## 2019-02-18 NOTE — ED Notes (Signed)
X-ray at bedside

## 2019-02-19 LAB — BASIC METABOLIC PANEL
Anion gap: 9 (ref 5–15)
BUN: 44 mg/dL — ABNORMAL HIGH (ref 8–23)
CO2: 24 mmol/L (ref 22–32)
Calcium: 8.5 mg/dL — ABNORMAL LOW (ref 8.9–10.3)
Chloride: 103 mmol/L (ref 98–111)
Creatinine, Ser: 1.86 mg/dL — ABNORMAL HIGH (ref 0.61–1.24)
GFR calc Af Amer: 44 mL/min — ABNORMAL LOW (ref >60)
GFR calc non Af Amer: 38 mL/min — ABNORMAL LOW (ref >60)
Glucose, Bld: 130 mg/dL — ABNORMAL HIGH (ref 70–99)
Potassium: 4.1 mmol/L (ref 3.5–5.1)
Sodium: 136 mmol/L (ref 135–145)

## 2019-02-19 LAB — URIC ACID: Uric Acid, Serum: 12.9 mg/dL — ABNORMAL HIGH (ref 3.7–8.6)

## 2019-02-19 LAB — FOLATE: Folate: 19 ng/mL (ref >5.9)

## 2019-02-19 LAB — VITAMIN B12: Vitamin B-12: 429 pg/mL (ref 180–914)

## 2019-02-19 LAB — HIV ANTIBODY (ROUTINE TESTING W REFLEX): HIV Screen 4th Generation wRfx: NONREACTIVE

## 2019-02-19 LAB — NOVEL CORONAVIRUS, NAA (HOSP ORDER, SEND-OUT TO REF LAB; TAT 18-24 HRS): SARS-CoV-2, NAA: NOT DETECTED

## 2019-02-19 MED ORDER — SODIUM CHLORIDE 0.9 % IV SOLN
INTRAVENOUS | Status: DC
  Administered 2019-02-19 – 2019-02-21 (×4): via INTRAVENOUS

## 2019-02-19 MED ORDER — COLCHICINE 0.6 MG PO TABS
0.6000 mg | ORAL_TABLET | Freq: Two times a day (BID) | ORAL | Status: AC
  Administered 2019-02-19 – 2019-02-22 (×6): 0.6 mg via ORAL
  Filled 2019-02-19 (×6): qty 1

## 2019-02-19 NOTE — Progress Notes (Signed)
[facility] [place] NAME: Kevin Gross    MR#:  [id number]  DATE OF BIRTH:  [birth year]  SUBJECTIVE:  CHIEF COMPLAINT:   Chief Complaint  Patient presents with  . Leg Pain   Came with progressively worsening ankle pain.  Seen by podiatry and ordered MRI which shows osteomyelitis.  Patient feels the pain is slightly better today.  REVIEW OF SYSTEMS:  CONSTITUTIONAL: No fever, fatigue or weakness.  EYES: No blurred or double vision.  EARS, NOSE, AND THROAT: No tinnitus or ear pain.  RESPIRATORY: No cough, shortness of breath, wheezing or hemoptysis.  CARDIOVASCULAR: No chest pain, orthopnea, edema.  GASTROINTESTINAL: No nausea, vomiting, diarrhea or abdominal pain.  GENITOURINARY: No dysuria, hematuria.  ENDOCRINE: No polyuria, nocturia,  HEMATOLOGY: No anemia, easy bruising or bleeding SKIN: No rash or lesion. MUSCULOSKELETAL: No joint pain or arthritis.   NEUROLOGIC: No tingling, numbness, weakness.  PSYCHIATRY: No anxiety or depression.   ROS  DRUG ALLERGIES:  No Known Allergies  VITALS:  Blood pressure 132/72, pulse 76, temperature 99.7 F (37.6 C), temperature source Oral, resp. rate 18, height 5\' 10"  (1.778 m), weight 132.5 kg, SpO2 94 %.  PHYSICAL EXAMINATION:  GENERAL:  63 y.o.-year-old patient lying in the bed with no acute distress.  EYES: Pupils equal, round, reactive to light and accommodation. No scleral icterus. Extraocular muscles intact.  HEENT: Head atraumatic, normocephalic. Oropharynx and nasopharynx clear.  NECK:  Supple, no jugular venous distention. No thyroid enlargement, no tenderness.  LUNGS: Normal breath sounds bilaterally, no wheezing, rales,rhonchi or crepitation. No use of accessory muscles of respiration.  CARDIOVASCULAR: S1, S2 normal. No murmurs, rubs, or gallops.  ABDOMEN: Soft, nontender, nondistended. Bowel sounds present. No organomegaly or mass.  EXTREMITIES: No pedal edema, cyanosis, or  clubbing.  Left ankle has slight swelling but no redness.  Distal pulses are palpable. NEUROLOGIC: Cranial nerves II through XII are intact. Muscle strength 5/5 in all extremities. Sensation intact. Gait not checked.  PSYCHIATRIC: The patient is alert and oriented x 3.  SKIN: No obvious rash, lesion, or ulcer.   Physical Exam LABORATORY PANEL:   CBC Recent Labs  Lab 02/18/19 0909  WBC 10.4  HGB 12.1*  HCT 38.3*  PLT 201   ------------------------------------------------------------------------------------------------------------------  Chemistries  Recent Labs  Lab 02/17/19 2058  02/19/19 0914  NA 139   < > 136  K 4.4   < > 4.1  CL 103   < > 103  CO2 26   < > 24  GLUCOSE 103*   < > 130*  BUN 30*   < > 44*  CREATININE 1.64*   < > 1.86*  CALCIUM 9.0   < > 8.5*  AST 28  --   --   ALT 28  --   --   ALKPHOS 46  --   --   BILITOT 1.0  --   --    < > = values in this interval not displayed.   ------------------------------------------------------------------------------------------------------------------  Cardiac Enzymes No results for input(s): TROPONINI in the last 168 hours. ------------------------------------------------------------------------------------------------------------------  RADIOLOGY:  Dg Ankle Complete Left  Result Date: 02/18/2019 CLINICAL DATA:  Pain and swelling. Redness. EXAM: LEFT ANKLE COMPLETE - 3+ VIEW COMPARISON:  None. FINDINGS: There is no evidence of fracture, dislocation, or joint effusion. Geographic sclerosis involving the talus may represent a bone infarct. No evidence of subchondral collapse. Probable enthesopathic changes at the medial malleolus. Plantar calcaneal spur. No bony destructive change or  periosteal reaction. Diffuse subcutaneous soft tissue edema. IMPRESSION: 1. Possible bone infarct in the talus. 2. Diffuse soft tissue edema. Electronically Signed   By: Keith Rake M.D.   On: 02/18/2019 03:03   Mr Ankle Left Wo  Contrast  Result Date: 02/18/2019 CLINICAL DATA:  Ankle pain and swelling. EXAM: MRI OF THE LEFT ANKLE WITHOUT CONTRAST TECHNIQUE: Multiplanar, multisequence MR imaging of the ankle was performed. No intravenous contrast was administered. COMPARISON:  None. FINDINGS: TENDONS Peroneal: Intact. No significant tendinopathy or tendon tear. Mild tenosynovitis involving the peroneus brevis tendon. Posteromedial: Mild tendinopathy and moderate tenosynovitis involving the posterior tibialis tendon. Anterior: Intact Achilles: Normal Plantar Fascia: Intact LIGAMENTS Lateral: Intact Medial: Intact CARTILAGE Ankle Joint: There is a moderate-sized ankle joint effusion. The articular cartilage appears intact. There is a large subchondral bone infarct involving the talar dome with minimal surrounding edema and a small amount of fluid. Subtalar Joints/Sinus Tarsi: The subtalar joints are maintained. Normal articular cartilage. Small joint effusion in the posterior talocalcaneal facet. There is mild edema in the sinus tarsi but the cervical and interosseous ligaments are intact. The spring ligament is intact. 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 This is not and osteochondral lesion. No overlying cartilage defect or obvious defect in the subchondral plate. The other bony structures are intact.  No stress fracture. Other: There is diffuse subcutaneous soft tissue swelling/edema/fluid of uncertain significance or etiology. It could reflect cellulitis. No findings for myofasciitis or pyomyositis. No findings for septic arthritis or osteomyelitis. IMPRESSION: 1. 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 No osteochondral lesion, cartilage defect or erosive changes. There is a moderate-sized ankle joint effusion. 2. Diffuse subcutaneous soft tissue swelling/edema/fluid. 3. Intact medial and lateral ankle ligaments and tendons. Electronically Signed   By: Marijo Sanes M.D.   On: 02/18/2019 14:14    US Venous Img Lower Unilateral Left  Result Date: 02/17/2019 CLINICAL DATA:  Left leg erythema and swelling EXAM: LEFT LOWER EXTREMITY VENOUS DOPPLER ULTRASOUND TECHNIQUE: Gray-scale sonography with graded compression, as well as color Doppler and duplex ultrasound were performed to evaluate the lower extremity deep venous systems from the level of the common femoral vein and including the common femoral, femoral, profunda femoral, popliteal and calf veins including the posterior tibial, peroneal and gastrocnemius veins when visible. The superficial great saphenous vein was also interrogated. Spectral Doppler was utilized to evaluate flow at rest and with distal augmentation maneuvers in the common femoral, femoral and popliteal veins. COMPARISON:  None. FINDINGS: Contralateral Common Femoral Vein: Respiratory phasicity is normal and symmetric with the symptomatic side. No evidence of thrombus. Normal compressibility. Common Femoral Vein: No evidence of thrombus. Normal compressibility, respiratory phasicity and response to augmentation. Saphenofemoral Junction: No evidence of thrombus. Normal compressibility and flow on color Doppler imaging. Profunda Femoral Vein: No evidence of thrombus. Normal compressibility and flow on color Doppler imaging. Femoral Vein: No evidence of thrombus. Normal compressibility, respiratory phasicity and response to augmentation. Popliteal Vein: No evidence of thrombus. Normal compressibility, respiratory phasicity and response to augmentation. Calf Veins: No evidence of thrombus. Normal compressibility and flow on color Doppler imaging. Superficial Great Saphenous Vein: No evidence of thrombus. Normal compressibility. Venous Reflux:  None. Other Findings:  None. IMPRESSION: No evidence of deep venous thrombosis. Electronically Signed   By: Ulyses Jarred M.D.   On: 02/17/2019 22:31    ASSESSMENT AND PLAN:   Active Problems:   Cellulitis of left lower extremity   1.Left  lower extremity cellulitis  with possible bone infarct -IV antibiotic therapy initiated with clindamycin -Podiatry, Dr. Vickki Muff, consulted for further evaluation  -We are managing pain with analgesic -Venous Doppler studies are negative for DVT - MRI confirms possible bone infarct Vs osteomyelitis  2. CKD- 3 -With baseline BUN 30 and creatinine 1.64. We will continue to monitor renal function with repeated BMP in the a.m.  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 Alice Reichert outpatient  4. Hyperlipidemia -Continue Zocor  5. Hypertension -Lisinopril continued  DVT and PPI prophylaxis initiated   All the records are reviewed and case discussed with Care Management/Social Workerr. Management plans discussed with the patient, family and they are in agreement.  CODE STATUS: Full  TOTAL TIME TAKING CARE OF THIS PATIENT: 35 minutes.     POSSIBLE D/C IN 1-2* DAYS, DEPENDING ON CLINICAL CONDITION.   Vaughan Basta M.D on 02/19/2019   Between 7am to 6pm - Pager - [phone]  After 6pm go to [email or web address] - password [email or web address] [facility]  Office  [phone]  CC: Primary care physician; [name], NP  Note: This dictation was prepared with Dragon dictation along with smaller phrase technology. Any transcriptional errors that result from this process are unintentional.

## 2019-02-19 NOTE — Progress Notes (Signed)
[facility]                                                      Patient Demographics  Kevin Gross, is a 63 y.o. male   MRN: [id number]   DOB - 12-17-1955  Admit Date - 02/18/2019    Outpatient Primary MD for the patient is [name], NP  Consult requested in the Hospital by Vaughan Basta, *, On 02/19/2019    With History of -  Past Medical History:  Diagnosis Date  . CHF (congestive heart failure) ([place])   . Clotting disorder ([facility])    Bilateral legs  . COPD (chronic obstructive pulmonary disease) ([place])   . Hyperlipidemia   . Hypertension       Past Surgical History:  Procedure Laterality Date  . CARDIAC CATHETERIZATION     [facility]  . COLONOSCOPY    . COLONOSCOPY WITH PROPOFOL N/A 01/17/2018   Procedure: COLONOSCOPY WITH PROPOFOL;  Surgeon: Toledo, Benay Pike, MD;  Location: [facility] ENDOSCOPY;  Service: Gastroenterology;  Laterality: N/A;  . ESOPHAGOGASTRODUODENOSCOPY (EGD) WITH PROPOFOL N/A 01/17/2018   Procedure: ESOPHAGOGASTRODUODENOSCOPY (EGD) WITH PROPOFOL;  Surgeon: Toledo, Benay Pike, MD;  Location: [facility] ENDOSCOPY;  Service: Gastroenterology;  Laterality: N/A;  . ORIF ANKLE FRACTURE Right     in for   Chief Complaint  Patient presents with  . Leg Pain     HPI  Kevin Gross  is a 63 y.o. male, hospitalized 2 days ago for swelling and redness to his ankle.  Actually got better the first day he was here but is gotten worse over the last 24 hours.  Pain is mostly on the medial ankle and lateral foot.  Pain is actually just below the ankle as far as the redness and inflammation is concerned.  MRI was done yesterday and showed infarct in the talus is the only positive bony finding.  Some diffuse soft tissue swelling is noted and some mild tenosynovitis but no specific indication of septic arthritis was noted.  Review of  Systems    In addition to the HPI above,  No Fever-chills, No Headache, No changes with Vision or hearing, No problems swallowing food or Liquids, No Chest pain, Cough or Shortness of Breath, No Abdominal pain, No Nausea or Vommitting, Bowel movements are regular, No Blood in stool or Urine, No dysuria, No new skin rashes or bruises, No new joints pains-aches,  No new weakness, tingling, numbness in any extremity, No recent weight gain or loss, No polyuria, polydypsia or polyphagia, No significant Mental Stressors.  A full 10 point Review of Systems was done, except as stated above, all other Review of Systems were negative.   Social History Social History   Tobacco Use  . Smoking status: Former Smoker    Packs/day: 1.00    Years: 47.00    Pack years: 47.00    Quit date: 2016    Years since quitting: 4.4  . Smokeless tobacco: Never Used  Substance Use Topics  . Alcohol use: Yes    Comment: occasionally    Family History Family History  Problem Relation Age of Onset  . Heart disease Mother   . Heart disease Brother     Prior to Admission medications   Medication Sig Start Date End Date Taking? Authorizing Provider  albuterol (PROVENTIL HFA;VENTOLIN HFA)  108 (90 Base) MCG/ACT inhaler Inhale into the lungs every 6 (six) hours as needed for wheezing or shortness of breath.   Yes [provider]  aspirin EC 81 MG tablet Take 81 mg by mouth daily.   Yes [provider]  atenolol-chlorthalidone (TENORETIC) 50-25 MG tablet Take 1 tablet by mouth daily.   Yes [provider]  chlorthalidone (HYGROTON) 25 MG tablet Take 1 tablet by mouth daily. 01/31/19  Yes [provider]  ezetimibe (ZETIA) 10 MG tablet Take 1 tablet (10 mg total) by mouth daily. 04/10/18  Yes Gollan, Kathlene November, MD  lisinopril (PRINIVIL,ZESTRIL) 10 MG tablet Take 10 mg by mouth daily.   Yes [provider]  simvastatin (ZOCOR) 20 MG tablet Take 10 mg by mouth daily.   01/22/19  Yes [provider]    Anti-infectives (From admission, onward)   Start     Dose/Rate Route Frequency Ordered Stop   02/18/19 1100  clindamycin (CLEOCIN) IVPB 600 mg     600 mg 100 mL/hr over 30 Minutes Intravenous Every 8 hours 02/18/19 0432     02/18/19 0245  clindamycin (CLEOCIN) capsule 300 mg     300 mg Oral  Once 02/18/19 0242 02/18/19 0245      Scheduled Meds: . aspirin EC  81 mg Oral Daily  . atenolol  50 mg Oral Daily   And  . chlorthalidone  25 mg Oral Daily  . colchicine  0.6 mg Oral BID  . enoxaparin (LOVENOX) injection  40 mg Subcutaneous Q12H  . ezetimibe  10 mg Oral Daily  . folic acid  1 mg Oral Daily  . pantoprazole  40 mg Oral Daily  . simvastatin  40 mg Oral Daily  . sodium chloride flush  3 mL Intravenous Q12H  . thiamine  100 mg Oral Daily   Continuous Infusions: . sodium chloride Stopped (02/19/19 0324)  . sodium chloride 75 mL/hr at 02/19/19 1030  . clindamycin (CLEOCIN) IV 600 mg (02/19/19 1302)   PRN Meds:.sodium chloride, acetaminophen **OR** acetaminophen, HYDROcodone-acetaminophen, morphine injection, ondansetron **OR** ondansetron (ZOFRAN) IV, polyethylene glycol, sodium chloride flush  No Known Allergies  Physical Exam  Vitals  Blood pressure (!) 118/52, pulse (!) 54, temperature 98.4 F (36.9 C), resp. rate 20, height _0  (1.778 m), weight 132.5 kg, SpO2 100 %.  Lower Extremity exam:  Vascular: Bilateral palpable  Dermatological: Skin is intact warm and dry but there is some erythema and redness over the medial foot and ankle as well as the lateral foot.  Neurological: Unremarkable  Ortho: Swelling pain with palpation to the medial foot and ankle as well as the lateral foot.  Erythema over these regions with some swelling particularly laterally on the foot.  Tenderness mostly seems to be over the fourth fifth metatarsal cuboid region and also over the talonavicular joint medially.  White count has come down to  within normal limits from 11.2-10.4.  No uric acid studies been done.  Data Review  CBC Recent Labs  Lab 02/17/19 2058 02/18/19 0909  WBC 11.2* 10.4  HGB 12.5* 12.1*  HCT 38.9* 38.3*  PLT 221 201  MCV 104.9* 106.1*  MCH 33.7 33.5  MCHC 32.1 31.6  RDW 13.0 13.2  LYMPHSABS 1.8  --   MONOABS 1.0  --   EOSABS 0.2  --   BASOSABS 0.0  --    ------------------------------------------------------------------------------------------------------------------  Chemistries  Recent Labs  Lab 02/17/19 2058 02/18/19 0909 02/19/19 0914  NA 139 139 136  K 4.4 4.1 4.1  CL 103 103 103  CO2 _0 GLUCOSE 103* 99 130*  BUN 30* 40* 44*  CREATININE 1.64* 2.19* 1.86*  CALCIUM 9.0 9.0 8.5*  AST 28  --   --   ALT 28  --   --   ALKPHOS 46  --   --   BILITOT 1.0  --   --    ------------------------------------------------------------------------------------------------------------------ estimated creatinine clearance is 57.1 mL/min (A) (by C-G formula based on SCr of 1.86 mg/dL (H)). ------------------------------------------------------------------------------------------------------------------ No results for input(s): TSH, T4TOTAL, T3FREE, THYROIDAB in the last 72 hours.  Invalid input(s): FREET3 Urinalysis No results found for: COLORURINE, APPEARANCEUR, LABSPEC, PHURINE, GLUCOSEU, HGBUR, BILIRUBINUR, KETONESUR, PROTEINUR, UROBILINOGEN, NITRITE, LEUKOCYTESUR   Imaging results:   Dg Ankle Complete Left  Result Date: 02/18/2019 CLINICAL DATA:  Pain and swelling. Redness. EXAM: LEFT ANKLE COMPLETE - 3+ VIEW COMPARISON:  None. FINDINGS: There is no evidence of fracture, dislocation, or joint effusion. Geographic sclerosis involving the talus may represent a bone infarct. No evidence of subchondral collapse. Probable enthesopathic changes at the medial malleolus. Plantar calcaneal spur. No bony destructive change or periosteal reaction. Diffuse subcutaneous soft tissue edema.  IMPRESSION: 1. Possible bone infarct in the talus. 2. Diffuse soft tissue edema. Electronically Signed   By: Keith Rake M.D.   On: 02/18/2019 03:03   Mr Ankle Left Wo Contrast  Result Date: 02/18/2019 CLINICAL DATA:  Ankle pain and swelling. EXAM: MRI OF THE LEFT ANKLE WITHOUT CONTRAST TECHNIQUE: Multiplanar, multisequence MR imaging of the ankle was performed. No intravenous contrast was administered. COMPARISON:  None. FINDINGS: TENDONS Peroneal: Intact. No significant tendinopathy or tendon tear. Mild tenosynovitis involving the peroneus brevis tendon. Posteromedial: Mild tendinopathy and moderate tenosynovitis involving the posterior tibialis tendon. Anterior: Intact Achilles: Normal Plantar Fascia: Intact LIGAMENTS Lateral: Intact Medial: Intact CARTILAGE Ankle Joint: There is a moderate-sized ankle joint effusion. The articular cartilage appears intact. There is a large subchondral bone infarct involving the talar dome with minimal surrounding edema and a small amount of fluid. Subtalar Joints/Sinus Tarsi: The subtalar joints are maintained. Normal articular cartilage. Small joint effusion in the posterior talocalcaneal facet. There is mild edema in the sinus tarsi but the cervical and interosseous ligaments are intact. The spring ligament is intact. 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 This is not and osteochondral lesion. No overlying cartilage defect or obvious defect in the subchondral plate. The other bony structures are intact.  No stress fracture. Other: There is diffuse subcutaneous soft tissue swelling/edema/fluid of uncertain significance or etiology. It could reflect cellulitis. No findings for myofasciitis or pyomyositis. No findings for septic arthritis or osteomyelitis. IMPRESSION: 1. 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 No osteochondral lesion, cartilage defect or erosive changes. There is a moderate-sized ankle joint effusion. 2. Diffuse  subcutaneous soft tissue swelling/edema/fluid. 3. Intact medial and lateral ankle ligaments and tendons. Electronically Signed   By: Marijo Sanes M.D.   On: 02/18/2019 14:14   US Venous Img Lower Unilateral Left  Result Date: 02/17/2019 CLINICAL DATA:  Left leg erythema and swelling EXAM: LEFT LOWER EXTREMITY VENOUS DOPPLER ULTRASOUND TECHNIQUE: Gray-scale sonography with graded compression, as well as color Doppler and duplex ultrasound were performed to evaluate the lower extremity deep venous systems from the level of the common femoral vein and including the common femoral, femoral, profunda femoral, popliteal and calf veins including the posterior tibial, peroneal and gastrocnemius veins when visible. The superficial great saphenous vein was  also interrogated. Spectral Doppler was utilized to evaluate flow at rest and with distal augmentation maneuvers in the common femoral, femoral and popliteal veins. COMPARISON:  None. FINDINGS: Contralateral Common Femoral Vein: Respiratory phasicity is normal and symmetric with the symptomatic side. No evidence of thrombus. Normal compressibility. Common Femoral Vein: No evidence of thrombus. Normal compressibility, respiratory phasicity and response to augmentation. Saphenofemoral Junction: No evidence of thrombus. Normal compressibility and flow on color Doppler imaging. Profunda Femoral Vein: No evidence of thrombus. Normal compressibility and flow on color Doppler imaging. Femoral Vein: No evidence of thrombus. Normal compressibility, respiratory phasicity and response to augmentation. Popliteal Vein: No evidence of thrombus. Normal compressibility, respiratory phasicity and response to augmentation. Calf Veins: No evidence of thrombus. Normal compressibility and flow on color Doppler imaging. Superficial Great Saphenous Vein: No evidence of thrombus. Normal compressibility. Venous Reflux:  None. Other Findings:  None. IMPRESSION: No evidence of deep venous  thrombosis. Electronically Signed   By: Ulyses Jarred M.D.   On: 02/17/2019 22:31    Assessment & Plan: Difficult to say the etiology for this swelling and pain at this point.  MRI was not really conclusive of septic arthritis but there is the anterior ankle joint effusion at this time.  This however is not where he has most of his pain most of his pain is along the medial talonavicular joint and lateral fourth fifth met cuboid joint.  He states this is happened to him before but it went away on its own and I have a feeling this is probably a gout flare but is never been tested for that.  Today I will order a serum uric acid but also get him started with colchicine.  Ideally we can start him on some prednisone if the uric acid level appears to be high.  The talar infarct is interesting and could be causative of some of this pain but uncertain of that at this timeframe.  I will recommend that he stay nonweightbearing on the right foot at this point.  I will await the serum uric acid and evaluate that tomorrow.  If fluid tends to gather more than that anterior ankle we may have to tap that joint for evaluation.  Active Problems:   Cellulitis of left lower extremity   Family Communication: Plan discussed with patient   Albertine Patricia M.D on 02/19/2019 at 5:40 PM  Thank you for the consult, we will follow the patient with you in the Hospital.

## 2019-02-20 LAB — BASIC METABOLIC PANEL
Anion gap: 8 (ref 5–15)
BUN: 52 mg/dL — ABNORMAL HIGH (ref 8–23)
CO2: 24 mmol/L (ref 22–32)
Calcium: 8 mg/dL — ABNORMAL LOW (ref 8.9–10.3)
Chloride: 104 mmol/L (ref 98–111)
Creatinine, Ser: 2.08 mg/dL — ABNORMAL HIGH (ref 0.61–1.24)
GFR calc Af Amer: 39 mL/min — ABNORMAL LOW (ref >60)
GFR calc non Af Amer: 33 mL/min — ABNORMAL LOW (ref >60)
Glucose, Bld: 103 mg/dL — ABNORMAL HIGH (ref 70–99)
Potassium: 4 mmol/L (ref 3.5–5.1)
Sodium: 136 mmol/L (ref 135–145)

## 2019-02-20 NOTE — Progress Notes (Signed)
[place] [facility] [place] NAME: Kevin Gross    MR#:  [id number]  DATE OF BIRTH:  09-14-1955  SUBJECTIVE:  CHIEF COMPLAINT:   Chief Complaint  Patient presents with  . Leg Pain   Came with progressively worsening ankle pain.  Seen by podiatry and ordered MRI which shows osteomyelitis.  Patient feels the pain is slightly better today.  REVIEW OF SYSTEMS:  CONSTITUTIONAL: No fever, fatigue or weakness.  EYES: No blurred or double vision.  EARS, NOSE, AND THROAT: No tinnitus or ear pain.  RESPIRATORY: No cough, shortness of breath, wheezing or hemoptysis.  CARDIOVASCULAR: No chest pain, orthopnea, edema.  GASTROINTESTINAL: No nausea, vomiting, diarrhea or abdominal pain.  GENITOURINARY: No dysuria, hematuria.  ENDOCRINE: No polyuria, nocturia,  HEMATOLOGY: No anemia, easy bruising or bleeding SKIN: No rash or lesion. MUSCULOSKELETAL: No joint pain or arthritis.   NEUROLOGIC: No tingling, numbness, weakness.  PSYCHIATRY: No anxiety or depression.   ROS  DRUG ALLERGIES:  No Known Allergies  VITALS:  Blood pressure 114/68, pulse 76, temperature 98.6 F (37 C), resp. rate 18, height 5\' 10"  (1.778 m), weight 132.5 kg, SpO2 98 %.  PHYSICAL EXAMINATION:  GENERAL:  63 y.o.-year-old patient lying in the bed with no acute distress.  EYES: Pupils equal, round, reactive to light and accommodation. No scleral icterus. Extraocular muscles intact.  HEENT: Head atraumatic, normocephalic. Oropharynx and nasopharynx clear.  NECK:  Supple, no jugular venous distention. No thyroid enlargement, no tenderness.  LUNGS: Normal breath sounds bilaterally, no wheezing, rales,rhonchi or crepitation. No use of accessory muscles of respiration.  CARDIOVASCULAR: S1, S2 normal. No murmurs, rubs, or gallops.  ABDOMEN: Soft, nontender, nondistended. Bowel sounds present. No organomegaly or mass.  EXTREMITIES: No pedal edema, cyanosis, or clubbing.  Left ankle has slight  swelling but no redness.  Distal pulses are palpable. NEUROLOGIC: Cranial nerves II through XII are intact. Muscle strength 5/5 in all extremities. Sensation intact. Gait not checked.  PSYCHIATRIC: The patient is alert and oriented x 3.  SKIN: No obvious rash, lesion, or ulcer.   Physical Exam LABORATORY PANEL:   CBC Recent Labs  Lab 02/18/19 0909  WBC 10.4  HGB 12.1*  HCT 38.3*  PLT 201   ------------------------------------------------------------------------------------------------------------------  Chemistries  Recent Labs  Lab 02/17/19 2058  02/20/19 0427  NA 139   < > 136  K 4.4   < > 4.0  CL 103   < > 104  CO2 26   < > 24  GLUCOSE 103*   < > 103*  BUN 30*   < > 52*  CREATININE 1.64*   < > 2.08*  CALCIUM 9.0   < > 8.0*  AST 28  --   --   ALT 28  --   --   ALKPHOS 46  --   --   BILITOT 1.0  --   --    < > = values in this interval not displayed.   ------------------------------------------------------------------------------------------------------------------  Cardiac Enzymes No results for input(s): TROPONINI in the last 168 hours. ------------------------------------------------------------------------------------------------------------------  RADIOLOGY:  No results found.  ASSESSMENT AND PLAN:   Active Problems:   Cellulitis of left lower extremity   1.Left lower extremity cellulitis with possible bone infarct -IV antibiotic therapy initiated with clindamycin -Podiatry, Dr. Vickki Muff, consulted for further evaluation  -We are managing pain with analgesic -Venous Doppler studies are negative for DVT - MRI confirms possible bone infarct but per podiatry said- this seems like gout-  started colchicine. Stop Abx.  2. CKD- 3 - With baseline BUN 30 and creatinine 1.64. We will continue to monitor renal function with repeated BMP in the a.m.   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 Alice Reichert outpatient  4.  Hyperlipidemia -Continue Zocor   5. Hypertension -Lisinopril continued  DVT and PPI prophylaxis initiated  We will get PT eval to check pt's ability to walk,  All the records are reviewed and case discussed with Care Management/Social Workerr. Management plans discussed with the patient, family and they are in agreement.  CODE STATUS: Full  TOTAL TIME TAKING CARE OF THIS PATIENT: 35 minutes.    POSSIBLE D/C IN 1-2 DAYS, DEPENDING ON CLINICAL CONDITION.   Vaughan Basta M.D on 02/20/2019   Between 7am to 6pm - Pager - [phone]  After 6pm go to [email or web address] - password [email or web address] [place] [facility]  Office  [phone]  CC: Primary care physician; [name], NP  Note: This dictation was prepared with Dragon dictation along with smaller phrase technology. Any transcriptional errors that result from this process are unintentional.

## 2019-02-20 NOTE — Progress Notes (Addendum)
Telemetry called and stated that patients HR had decreased to 43 but shortly increased back up to 70. Patient is doing fine no s/s. I did page the provider and made her aware of same.

## 2019-02-20 NOTE — TOC Initial Note (Signed)
Transition of Care [facility]) - Initial/Assessment Note    Patient Details  Name: Kevin Gross MRN: [id number] Date of Birth: 01/01/1956  Transition of Care [facility]) CM/SW Contact:    Shelbie Hutching, RN Phone Number: 02/20/2019, 10:26 AM  Clinical Narrative:                 Patient admitted with cellulitis left lower extremity.  Patient is from home where he lives with his wife and daughter and daughter's boyfriend.  Patient is independent at baseline and reports that he is usually taking care of his wife who has COPD and is on chronic O2.  Patient is current with PCP, no trouble obtaining prescriptions, and has reliable transportation.  No discharge needs identified at this time.    Expected Discharge Plan: Home/Self Care Barriers to Discharge: Continued Medical Work up   Patient Goals and CMS Choice Patient states their goals for this hospitalization and ongoing recovery are:: Get better and go home and to not need any pain medication at home.      Expected Discharge Plan and Services Expected Discharge Plan: Home/Self Care       Living arrangements for the past 2 months: Single Family Home Expected Discharge Date: 02/20/19                                    Prior Living Arrangements/Services Living arrangements for the past 2 months: Single Family Home Lives with:: Adult Children, Spouse Patient language and need for interpreter reviewed:: No Do you feel safe going back to the place where you live?: Yes        Care giver support system in place?: Yes (comment)(wife)   Criminal Activity/Legal Involvement Pertinent to Current Situation/Hospitalization: No - Comment as needed  Activities of Daily Living Home Assistive Devices/Equipment: Eyeglasses, CPAP, Walker (specify type), Cane (specify quad or straight)(4xwheels walker, straight cane) ADL Screening (condition at time of admission) Patient's cognitive ability adequate to safely complete daily activities?: Yes Is  the patient deaf or have difficulty hearing?: No Does the patient have difficulty seeing, even when wearing glasses/contacts?: No Does the patient have difficulty concentrating, remembering, or making decisions?: No Patient able to express need for assistance with ADLs?: Yes Does the patient have difficulty dressing or bathing?: No Independently performs ADLs?: Yes (appropriate for developmental age) Does the patient have difficulty walking or climbing stairs?: Yes Weakness of Legs: None Weakness of Arms/Hands: None  Permission Sought/Granted                  Emotional Assessment Appearance:: Appears stated age Attitude/Demeanor/Rapport: Engaged Affect (typically observed): Accepting Orientation: : Oriented to Self, Oriented to Place, Oriented to  Time, Oriented to Situation Alcohol / Substance Use: Not Applicable Psych Involvement: No (comment)  Admission diagnosis:  Cellulitis of left lower extremity [L03.116] Bone infarct [facility]) [M87.9] Patient Active Problem List   Diagnosis Date Noted  . Cellulitis of left lower extremity 02/18/2019  . Aortic atherosclerosis ([place]) 04/10/2018  . Mixed hyperlipidemia 04/10/2018  . Morbid obesity ([place]) 04/10/2018  . CAD (coronary artery disease), native coronary artery 04/08/2018  . COPD (chronic obstructive pulmonary disease) ([place]) 04/08/2018  . CRI (chronic renal insufficiency), stage 3 (moderate) (HCC) 04/08/2018   PCP:  [name], NP Pharmacy:   [place] [address] [facility] [address] [place]  [place] [address] Phone: [phone] Fax: [phone]  [place] [place] [address] Phone: [phone] Fax: [phone]     Social Determinants of Health (SDOH) Interventions    Readmission Risk Interventions No flowsheet data found.

## 2019-02-20 NOTE — Progress Notes (Signed)
PT Cancellation Note  Patient Details Name: Kevin Gross MRN: [id number] DOB: 07/06/1956   Cancelled Treatment:    Reason Eval/Treat Not Completed: Medical issues which prohibited therapy,Podiatry, Dr. Vickki Muff, consulted for further evaluation of possible bone infarct in the talus present on left ankle x-ray.   [address], [place], [place] DPT 02/20/2019, 5:09 PM

## 2019-02-21 MED ORDER — SIMVASTATIN 20 MG PO TABS
10.0000 mg | ORAL_TABLET | Freq: Every day | ORAL | Status: DC
  Administered 2019-02-21: 10 mg via ORAL
  Filled 2019-02-21: qty 1

## 2019-02-21 MED ORDER — TRIAMCINOLONE ACETONIDE 40 MG/ML IJ SUSP
40.0000 mg | Freq: Once | INTRAMUSCULAR | Status: AC
  Administered 2019-02-21: 40 mg via INTRAMUSCULAR
  Filled 2019-02-21: qty 1

## 2019-02-21 NOTE — Progress Notes (Signed)
[facility]                                                      Patient Demographics  Kevin Gross, is a 63 y.o. male   MRN: [id number]   DOB - 01/05/1956  Admit Date - 02/18/2019    Outpatient Primary MD for the patient is Pointer, Elizabeth, NP  Consult requested in the Hospital by Vachhani, Vaibhavkumar, *, On 02/21/2019   With History of -  Past Medical History:  Diagnosis Date  . CHF (congestive heart failure) (HCC)   . Clotting disorder (HCC)    Bilateral legs  . COPD (chronic obstructive pulmonary disease) (HCC)   . Hyperlipidemia   . Hypertension       Past Surgical History:  Procedure Laterality Date  . CARDIAC CATHETERIZATION     [facility]  . COLONOSCOPY    . COLONOSCOPY WITH PROPOFOL N/A 01/17/2018   Procedure: COLONOSCOPY WITH PROPOFOL;  Surgeon: Toledo, Teodoro K, MD;  Location: [facility] ENDOSCOPY;  Service: Gastroenterology;  Laterality: N/A;  . ESOPHAGOGASTRODUODENOSCOPY (EGD) WITH PROPOFOL N/A 01/17/2018   Procedure: ESOPHAGOGASTRODUODENOSCOPY (EGD) WITH PROPOFOL;  Surgeon: Toledo, Teodoro K, MD;  Location: [facility] ENDOSCOPY;  Service: Gastroenterology;  Laterality: N/A;  . ORIF ANKLE FRACTURE Right     in for   Chief Complaint  Patient presents with  . Leg Pain     HPI  Kevin Gross  is a 63 y.o. male, mid to the hospital on Monday because of acute swelling and pain to his right foot and ankle.  Concerns regarding a talar infarct were noted based on his x-ray and MRI findings.  I did run a uric acid test on however now was extremely high which would indicate that this is also a very high incidence of acute gout attack.  Also put him on colchicine which has improved the situation some.  Still has some soreness with palpation along the medial ankle and talonavicular joint and also along the lateral calcaneal cuboid fourth fifth met  cuboid joints.  Portions foot may actually be more sore.    Review of Systems    In addition to the HPI above,  No Fever-chills, No Headache, No changes with Vision or hearing, No problems swallowing food or Liquids, No Chest pain, Cough or Shortness of Breath, No Abdominal pain, No Nausea or Vommitting, Bowel movements are regular, No Blood in stool or Urine, No dysuria, No new skin rashes or bruises, No new joints pains-aches,  No new weakness, tingling, numbness in any extremity, No recent weight gain or loss, No polyuria, polydypsia or polyphagia, No significant Mental Stressors.  A full 10 point Review of Systems was done, except as stated above, all other Review of Systems were negative.   Social History Social History   Tobacco Use  . Smoking status: Former Smoker    Packs/day: 1.00    Years: 47.00    Pack years: 47.00    Quit date: 2016    Years since quitting: 4.4  . Smokeless tobacco: Never Used  Substance Use Topics  . Alcohol use: Yes    Comment: occasionally    Family History Family History  Problem Relation Age of Onset  . Heart disease Mother   . Heart disease Brother     Prior to Admission medications   Medication   Sig Start Date End Date Taking? Authorizing Provider  albuterol (PROVENTIL HFA;VENTOLIN HFA) 108 (90 Base) MCG/ACT inhaler Inhale into the lungs every 6 (six) hours as needed for wheezing or shortness of breath.   Yes [provider]  aspirin EC 81 MG tablet Take 81 mg by mouth daily.   Yes [provider]  atenolol-chlorthalidone (TENORETIC) 50-25 MG tablet Take 1 tablet by mouth daily.   Yes [provider]  chlorthalidone (HYGROTON) 25 MG tablet Take 1 tablet by mouth daily. 01/31/19  Yes [provider]  ezetimibe (ZETIA) 10 MG tablet Take 1 tablet (10 mg total) by mouth daily. 04/10/18  Yes Gollan, Timothy J, MD  lisinopril (PRINIVIL,ZESTRIL) 10 MG tablet Take 10 mg by mouth daily.   Yes [provider]  simvastatin (ZOCOR) 20 MG tablet Take 10 mg by mouth daily.  01/22/19  Yes [provider]    Anti-infectives (From admission, onward)   Start     Dose/Rate Route Frequency Ordered Stop   02/18/19 1100  clindamycin (CLEOCIN) IVPB 600 mg  Status:  Discontinued     600 mg 100 mL/hr over 30 Minutes Intravenous Every 8 hours 02/18/19 0432 02/20/19 1204   02/18/19 0245  clindamycin (CLEOCIN) capsule 300 mg     300 mg Oral  Once 02/18/19 0242 02/18/19 0245      Scheduled Meds: . aspirin EC  81 mg Oral Daily  . atenolol  50 mg Oral Daily   And  . chlorthalidone  25 mg Oral Daily  . colchicine  0.6 mg Oral BID  . enoxaparin (LOVENOX) injection  40 mg Subcutaneous Q12H  . ezetimibe  10 mg Oral Daily  . folic acid  1 mg Oral Daily  . pantoprazole  40 mg Oral Daily  . simvastatin  10 mg Oral Daily  . sodium chloride flush  3 mL Intravenous Q12H  . thiamine  100 mg Oral Daily  . triamcinolone acetonide  40 mg Intramuscular Once   Continuous Infusions: . sodium chloride Stopped (02/19/19 0324)  . sodium chloride 75 mL/hr at 02/21/19 0622   PRN Meds:.sodium chloride, acetaminophen **OR** acetaminophen, HYDROcodone-acetaminophen, morphine injection, ondansetron **OR** ondansetron (ZOFRAN) IV, polyethylene glycol, sodium chloride flush  No Known Allergies  Physical Exam  Vitals  Blood pressure 139/73, pulse 68, temperature 98.7 F (37.1 C), resp. rate 18, height 5' 10" (1.778 m), weight 132.5 kg, SpO2 96 %.  Lower Extremity exam: Swelling and redness are significantly down as compared to a couple days ago.  I think he did respond fairly well to the colchicine.  He has not been started on a prednisone Dosepak yet some probably going to order a Kenalog for muscular injection today to see if that knocks it out the rest of the way.  Has some general venous stasis disease as well.  Data Review  CBC Recent Labs  Lab 02/17/19 2058 02/18/19 0909  WBC 11.2* 10.4   HGB 12.5* 12.1*  HCT 38.9* 38.3*  PLT 221 201  MCV 104.9* 106.1*  MCH 33.7 33.5  MCHC 32.1 31.6  RDW 13.0 13.2  LYMPHSABS 1.8  --   MONOABS 1.0  --   EOSABS 0.2  --   BASOSABS 0.0  --    ------------------------------------------------------------------------------------------------------------------  Chemistries  Recent Labs  Lab 02/17/19 2058 02/18/19 0909 02/19/19 0914 02/20/19 0427  NA 139 139 136 136  K 4.4 4.1 4.1 4.0  CL 103 103 103 104  CO2 26 24 24 24  GLUCOSE   103* 99 130* 103*  BUN 30* 40* 44* 52*  CREATININE 1.64* 2.19* 1.86* 2.08*  CALCIUM 9.0 9.0 8.5* 8.0*  AST 28  --   --   --   ALT 28  --   --   --   ALKPHOS 46  --   --   --   BILITOT 1.0  --   --   --    ----------------- Assessment & Plan: Pain is likely that he has an acute gout flare.  The infarct in the talar body may be something that is older is been there for a while but is not likely that significantly responsible for the pain that he has considering he has a lot of swelling and pain on the tarsometatarsal region laterally.  Pain is more likely that he has gout based on his clinical findings as well as his significantly elevated uric acid level. Today I ordered a 40 mg triamcinolone injection intramuscularly see that would help reduce his inflammation more also ordered physical therapy to work with him to put him in a cam walking boot and let him start to bear weight.  Full weightbearing should be okay initially at start with a walker and then progressed to a cane according to his tolerance.  I will need to see me tomorrow if he is doing well but I would recommend he follow-up with rheumatology due to the significantly high uric acid serum levels. Active Problems:   Cellulitis of left lower extremity   Family Communication: Plan discussed with patient   Matthew Troxler M.D on 02/21/2019 at 2:47 PM  Thank you for the consult, we will follow the patient with you in the Hospital.   

## 2019-02-21 NOTE — Evaluation (Signed)
Physical Therapy Evaluation Patient Details Name: Kevin Gross MRN: [id number] DOB: 1956-07-15 Today's Date: 02/21/2019   History of Present Illness  Patient is a 63 y/o male that presents with L ankle pain and swelling. He was found to have potential L talus infarct, possible gout flare up, and treatment for cellulitis with antibiotics.  Clinical Impression  Patient seen for L ankle pain, managed as NWBing in this session. However, once this session was complete RN informed PT that CAM boot would be ordered for patient, and per discussion with Dr. Elvina Mattes on secure messenger patient will be full weightbearing with CAM boot. Patient was able to hop to the door with RW in this session, however required significant assistance to complete sit to stand transfer from elevated position. He does have 5 steps to get into the house, however will have help from family and should be more functional with CAM boot in place. At this time will recommend HHPT with the anticipation that patient will be able to ascend steps more easily with full weightbearing through LLE rather than NWBing demonstrated in this session.     Follow Up Recommendations Home health PT    Equipment Recommendations  Rolling walker with 5" wheels;3in1 (PT);Other (comment)(CAM boot)    Recommendations for Other Services       Precautions / Restrictions Precautions Precautions: Fall Restrictions Weight Bearing Restrictions: Yes Other Position/Activity Restrictions: Prior to this session Podiatry had recommended NWBing status on LLE. After session was complete RN informed therapist that patient was having a CAM boot ordered. PT messaged Dr. Elvina Mattes via secure messenger, who confirmed once CAM boot in place patient would be full weight bearing through his LLE.      Mobility  Bed Mobility Overal bed mobility: Independent             General bed mobility comments: No deficits observed.  Transfers Overall transfer level:  Needs assistance Equipment used: Rolling walker (2 wheeled) Transfers: Sit to/from Stand Sit to Stand: Mod assist;From elevated surface         General transfer comment: Patient required mod A x 1 with RW to complete transfer as he was maintaining NWBing on LLE.  Ambulation/Gait Ambulation/Gait assistance: Min guard Gait Distance (Feet): 15 Feet Assistive device: Rolling walker (2 wheeled)     Gait velocity interpretation: <1.31 ft/sec, indicative of household ambulator General Gait Details: Hopping gait pattern instructed on RLE, maintaining NWBing on LLE. No loss of balance and appropriate mechanics demonstrated.  Stairs            Wheelchair Mobility    Modified Rankin (Stroke Patients Only)       Balance Overall balance assessment: Needs assistance Sitting-balance support: Bilateral upper extremity supported;Feet supported Sitting balance-Leahy Scale: Good     Standing balance support: Bilateral upper extremity supported Standing balance-Leahy Scale: Good                               Pertinent Vitals/Pain Pain Assessment: Faces Faces Pain Scale: Hurts a little bit Pain Location: L medial ankle Pain Descriptors / Indicators: Aching Pain Intervention(s): Limited activity within patient's tolerance;Monitored during session    Home Living Family/patient expects to be discharged to:: Private residence Living Arrangements: Spouse/significant other;Children Available Help at Discharge: Family;Available 24 hours/day Type of Home: House Home Access: Stairs to enter   [employer] of Steps: 5 Home Layout: One level Home Equipment: Cane - single point  Prior Function Level of Independence: Independent         Comments: Patient reports he has been independent with mobility prior to this episode.     Hand Dominance        Extremity/Trunk Assessment        Lower Extremity Assessment Lower Extremity Assessment: LLE  deficits/detail LLE Deficits / Details: Notable edema and discomfort to palpation inferior to medial malleolus on LLE.       Communication   Communication: No difficulties  Cognition Arousal/Alertness: Awake/alert Behavior During Therapy: WFL for tasks assessed/performed Overall Cognitive Status: Within Functional Limits for tasks assessed                                        General Comments General comments (skin integrity, edema, etc.): Notable edema on lower LLE.    Exercises     Assessment/Plan    PT Assessment Patient needs continued PT services  PT Problem List Decreased strength;Decreased mobility;Decreased activity tolerance;Pain;Decreased balance       PT Treatment Interventions DME instruction;Therapeutic exercise;Gait training;Balance training;Stair training;Neuromuscular re-education;Therapeutic activities;Patient/family education    PT Goals (Current goals can be found in the Care Plan section)  Acute Rehab PT Goals Patient Stated Goal: To return home safely. PT Goal Formulation: With patient Time For Goal Achievement: 03/07/19 Potential to Achieve Goals: Good    Frequency Min 2X/week   Barriers to discharge Inaccessible home environment 5 steps to enter house    Co-evaluation               AM-PAC PT "6 Clicks" Mobility  Outcome Measure Help needed turning from your back to your side while in a flat bed without using bedrails?: None Help needed moving from lying on your back to sitting on the side of a flat bed without using bedrails?: None Help needed moving to and from a bed to a chair (including a wheelchair)?: A Lot Help needed standing up from a chair using your arms (e.g., wheelchair or bedside chair)?: A Lot Help needed to walk in hospital room?: A Little Help needed climbing 3-5 steps with a railing? : A Lot 6 Click Score: 17    End of Session Equipment Utilized During Treatment: Gait belt Activity Tolerance: Patient  tolerated treatment well Patient left: with chair alarm set;in chair;with call bell/phone within reach Nurse Communication: Mobility status(Provided RN desk with [employer] phone number for CAM boot.) PT Visit Diagnosis: Difficulty in walking, not elsewhere classified (R26.2)    Time: 9532-0233 PT Time Calculation (min) (ACUTE ONLY): 32 min   Charges:   PT Evaluation $PT Eval Moderate Complexity: 1 Mod      Royce Macadamia PT, DPT, CSCS       02/21/2019, 4:38 PM

## 2019-02-21 NOTE — Progress Notes (Signed)
[name] NAME: Kevin Gross    MR#:  [id number]  DATE OF BIRTH:  February 04, 1956  SUBJECTIVE:  CHIEF COMPLAINT:   Chief Complaint  Patient presents with  . Leg Pain   Came with progressively worsening ankle pain.  Seen by podiatry and ordered MRI which shows bone infarct.  Patient feels the pain is slightly better today.  REVIEW OF SYSTEMS:  CONSTITUTIONAL: No fever, fatigue or weakness.  EYES: No blurred or double vision.  EARS, NOSE, AND THROAT: No tinnitus or ear pain.  RESPIRATORY: No cough, shortness of breath, wheezing or hemoptysis.  CARDIOVASCULAR: No chest pain, orthopnea, edema.  GASTROINTESTINAL: No nausea, vomiting, diarrhea or abdominal pain.  GENITOURINARY: No dysuria, hematuria.  ENDOCRINE: No polyuria, nocturia,  HEMATOLOGY: No anemia, easy bruising or bleeding SKIN: No rash or lesion. MUSCULOSKELETAL: No joint pain or arthritis.   NEUROLOGIC: No tingling, numbness, weakness.  PSYCHIATRY: No anxiety or depression.   ROS  DRUG ALLERGIES:  No Known Allergies  VITALS:  Blood pressure 123/88, pulse 60, temperature 98.8 F (37.1 C), temperature source Oral, resp. rate 20, height 5\' 10"  (1.778 m), weight 132.5 kg, SpO2 98 %.  PHYSICAL EXAMINATION:  GENERAL:  63 y.o.-year-old patient lying in the bed with no acute distress.  EYES: Pupils equal, round, reactive to light and accommodation. No scleral icterus. Extraocular muscles intact.  HEENT: Head atraumatic, normocephalic. Oropharynx and nasopharynx clear.  NECK:  Supple, no jugular venous distention. No thyroid enlargement, no tenderness.  LUNGS: Normal breath sounds bilaterally, no wheezing, rales,rhonchi or crepitation. No use of accessory muscles of respiration.  CARDIOVASCULAR: S1, S2 normal. No murmurs, rubs, or gallops.  ABDOMEN: Soft, nontender, nondistended. Bowel sounds present. No organomegaly or mass.  EXTREMITIES: No pedal edema, cyanosis, or  clubbing.  Left ankle has slight swelling but no redness.  Distal pulses are palpable. NEUROLOGIC: Cranial nerves II through XII are intact. Muscle strength 5/5 in all extremities. Sensation intact. Gait not checked.  PSYCHIATRIC: The patient is alert and oriented x 3.  SKIN: No obvious rash, lesion, or ulcer.   Physical Exam LABORATORY PANEL:   CBC Recent Labs  Lab 02/18/19 0909  WBC 10.4  HGB 12.1*  HCT 38.3*  PLT 201   ------------------------------------------------------------------------------------------------------------------  Chemistries  Recent Labs  Lab 02/17/19 2058  02/20/19 0427  NA 139   < > 136  K 4.4   < > 4.0  CL 103   < > 104  CO2 26   < > 24  GLUCOSE 103*   < > 103*  BUN 30*   < > 52*  CREATININE 1.64*   < > 2.08*  CALCIUM 9.0   < > 8.0*  AST 28  --   --   ALT 28  --   --   ALKPHOS 46  --   --   BILITOT 1.0  --   --    < > = values in this interval not displayed.   ------------------------------------------------------------------------------------------------------------------  Cardiac Enzymes No results for input(s): TROPONINI in the last 168 hours. ------------------------------------------------------------------------------------------------------------------  RADIOLOGY:  No results found.  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 -IV antibiotic therapy initiated with clindamycin-stop antibiotic as infection ruled out likely it was gout flare. -Podiatry, Dr. Vickki Muff, consulted for further evaluation  -We are managing pain with analgesic -Venous Doppler studies are  negative for DVT - MRI confirms possible bone infarct but per podiatry said- this seems like gout- started colchicine. Stop Abx. -Given IM steroid 1 dose, also will give tapering oral steroid on discharge. Podiatry suggest special surgical boot and physical  therapy evaluation which would be done either later today or tomorrow.  2. CKD- 3 - With baseline BUN 30 and creatinine 1.64. We will continue to monitor renal function with repeated BMP in the a.m.   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 Alice Reichert outpatient  4. Hyperlipidemia -Continue Zocor   5. Hypertension -Lisinopril continued  DVT and PPI prophylaxis initiated  We will get PT eval to check pt's ability to walk,  All the records are reviewed and case discussed with Care Management/Social Workerr. Management plans discussed with the patient, family and they are in agreement.  CODE STATUS: Full  TOTAL TIME TAKING CARE OF THIS PATIENT: 35 minutes.    POSSIBLE D/C IN 1-2 DAYS, DEPENDING ON CLINICAL CONDITION.   Vaughan Basta M.D on 02/21/2019   Between 7am to 6pm - Pager - [phone]  After 6pm go to [email or web address] - password [email or web address] [facility]  Office  [phone]  CC: Primary care physician; [name], NP  Note: This dictation was prepared with Dragon dictation along with smaller phrase technology. Any transcriptional errors that result from this process are unintentional.

## 2019-02-21 NOTE — Care Management Important Message (Signed)
Important Message  Patient Details  Name: Kevin Gross MRN: [id number] Date of Birth: 05/31/1956   Medicare Important Message Given:  Yes     [name] 02/21/2019, 11:19 AM

## 2019-02-22 LAB — BASIC METABOLIC PANEL
Anion gap: 6 (ref 5–15)
BUN: 33 mg/dL — ABNORMAL HIGH (ref 8–23)
CO2: 23 mmol/L (ref 22–32)
Calcium: 8 mg/dL — ABNORMAL LOW (ref 8.9–10.3)
Chloride: 110 mmol/L (ref 98–111)
Creatinine, Ser: 1.32 mg/dL — ABNORMAL HIGH (ref 0.61–1.24)
GFR calc Af Amer: >60 mL/min (ref >60)
GFR calc non Af Amer: 58 mL/min — ABNORMAL LOW (ref >60)
Glucose, Bld: 103 mg/dL — ABNORMAL HIGH (ref 70–99)
Potassium: 4.7 mmol/L (ref 3.5–5.1)
Sodium: 139 mmol/L (ref 135–145)

## 2019-02-22 MED ORDER — HYDROCODONE-ACETAMINOPHEN 5-325 MG PO TABS: 1.0000 | ORAL_TABLET | Freq: Four times a day (QID) | ORAL | 0 refills | Status: DC | PRN

## 2019-02-22 MED ORDER — SIMVASTATIN 10 MG PO TABS: 10.0000 mg | ORAL_TABLET | Freq: Every day | ORAL | 0 refills | Status: DC

## 2019-02-22 MED ORDER — PREDNISONE 10 MG (21) PO TBPK: ORAL_TABLET | ORAL | 0 refills | Status: DC

## 2019-02-22 MED ORDER — COLCHICINE 0.6 MG PO TABS: 0.6000 mg | ORAL_TABLET | Freq: Two times a day (BID) | ORAL | 0 refills | Status: DC

## 2019-02-22 MED ORDER — POLYETHYLENE GLYCOL 3350 17 G PO PACK: 17.0000 g | PACK | Freq: Every day | ORAL | 0 refills | Status: DC | PRN

## 2019-02-22 MED ORDER — ACETAMINOPHEN 325 MG PO TABS: 650.0000 mg | ORAL_TABLET | Freq: Four times a day (QID) | ORAL | 0 refills | Status: DC | PRN

## 2019-02-22 NOTE — Progress Notes (Signed)
Subjective: Patient states he is doing much better today received intramuscular injection yesterday which helped reduce the inflammation further.  Is been able to get up in the boot and move around some he states it feels pretty good from overall standpoint.  Objective: Exam shows that the erythema over the medial ankle and foot is completely resolved there is a little residual on the lateral portion of his foot that is little more tender but overall is significantly improved.  Currently taking colchicine and also receives the triamcinolone 40 mg injection yesterday.  Assessment/plan: Overall I think he is significantly improved.  I would recommend discharge with the boot as well as continue the colchicine and also a prednisone Dosepak to try to knock out the residual inflammation.  I like to follow him up in about a week and 1/2 to 2 weeks.  Recommend he also follow-up with rheumatology so they can address his significantly elevated uric acid.  Renal function is also concerned I think he will follow-up with nephrology.

## 2019-02-22 NOTE — Progress Notes (Signed)
Physical Therapy Treatment Patient Details Name: Kevin Gross MRN: [id number] DOB: October 14, 1955 Today's Date: 02/22/2019    History of Present Illness 63 y/o male that presents with L ankle pain and swelling. He was found to have potential L talus infarct, possible gout flare up, and treatment for cellulitis with antibiotics.    PT Comments    Pt eager to work with PT and try longer bout of ambulation with boot as well as trial steps.  He showed good overall safety and effort and did not have excessive fatigue or other issues.  Pt with no LOBs and was able to manage steps on first attempt w/o overt difficulty.  Pt safe and appropriate for home.     Follow Up Recommendations  Home health PT     Equipment Recommendations  Rolling walker with 5" wheels;3in1 (PT);Other (comment)    Recommendations for Other Services       Precautions / Restrictions Precautions Precautions: Fall Restrictions LLE Weight Bearing: Weight bearing as tolerated(with CAM walker, NWBing w/o)    Mobility  Bed Mobility Overal bed mobility: Independent             General bed mobility comments: No deficits observed.  Transfers Overall transfer level: Modified independent Equipment used: Rolling walker (2 wheeled) Transfers: Sit to/from Stand Sit to Stand: Min guard         General transfer comment: Pt did need reminders for UE use and awareness with walker  Ambulation/Gait Ambulation/Gait assistance: Min guard Gait Distance (Feet): 100 Feet Assistive device: Rolling walker (2 wheeled)       General Gait Details: CAM boot on L, pt able to take consistent and confident steps with no LOBs, reasonable walker use and no LOBs   Stairs Stairs: Yes Stairs assistance: Supervision Stair Management: Two rails;Step to pattern Number of Stairs: 6 General stair comments: Pt was able to negotiate steps w/o phyiscal assist, showed good safety and confidence   Wheelchair Mobility    Modified  [place] (Stroke Patients Only)       Balance Overall balance assessment: Needs assistance Sitting-balance support: Bilateral upper extremity supported;Feet supported Sitting balance-Leahy Scale: Good     Standing balance support: Bilateral upper extremity supported Standing balance-Leahy Scale: Good                              Cognition Arousal/Alertness: Awake/alert Behavior During Therapy: WFL for tasks assessed/performed Overall Cognitive Status: Within Functional Limits for tasks assessed                                        Exercises      General Comments        Pertinent Vitals/Pain Pain Assessment: No/denies pain Faces Pain Scale: Hurts a little bit    Home Living                      Prior Function            PT Goals (current goals can now be found in the care plan section) Progress towards PT goals: Progressing toward goals    Frequency    Min 2X/week      PT Plan Current plan remains appropriate    Co-evaluation              AM-PAC PT "6 Clicks" Mobility  Outcome Measure  Help needed turning from your back to your side while in a flat bed without using bedrails?: None Help needed moving from lying on your back to sitting on the side of a flat bed without using bedrails?: None Help needed moving to and from a bed to a chair (including a wheelchair)?: None Help needed standing up from a chair using your arms (e.g., wheelchair or bedside chair)?: None Help needed to walk in hospital room?: A Little Help needed climbing 3-5 steps with a railing? : A Little 6 Click Score: 22    End of Session Equipment Utilized During Treatment: Gait belt Activity Tolerance: Patient tolerated treatment well Patient left: with bed alarm set;with call bell/phone within reach Nurse Communication: Mobility status PT Visit Diagnosis: Difficulty in walking, not elsewhere classified (R26.2)     Time: 3254-9826 PT  Time Calculation (min) (ACUTE ONLY): 24 min  Charges:  $Gait Training: 23-37 mins                     Kreg Shropshire, DPT 02/22/2019, 1:23 PM

## 2019-02-22 NOTE — Progress Notes (Signed)
   02/22/19 1100  Clinical Encounter Type  Visited With Patient  Visit Type Initial  Ch was rounding. Pt was sitting up and alert. Pt said he was going home today. 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 Pt is excited to be reunited with his dog and to go back to his caregiver role upon his return home. Pt said that his wife was recently hospitalized and that helps him understand what the family might be going through waiting for his return. Pt has good family support. Ch gave well wishes.

## 2019-02-22 NOTE — TOC Transition Note (Signed)
Transition of Care [facility]) - CM/SW Discharge Note   Patient Details  Name: Kevin Gross MRN: [id number] Date of Birth: 1956-08-08  Transition of [facility] CM/SW Contact:  Latanya Maudlin, RN Phone Number: 02/22/2019, 8:53 AM   Clinical Narrative: .Patient to be discharged per MD order. Orders in place for home health services. CMS Medicare.gov Compare Post Acute Care list reviewed with patient and he agreeable to home health. Patient has no preference of agency. Referral placed with Corene Cornea at [facility] care. Patient reports he has all needed DME and was mainly concerned about a CAM boot for his foot which was delivered to the bed side via the podiatry team. No further TOC team needs.     Merrily Pew Ledon Weihe RN BSN RNCM [phone]    Final next level of care: [place] Barriers to Discharge: No Barriers Identified   Patient Goals and CMS Choice Patient states their goals for this hospitalization and ongoing recovery are:: to get my therapy and take care of my wife CMS Medicare.gov Compare Post Acute Care list provided to:: Patient Choice offered to / list presented to : Patient  Discharge Placement                       Discharge Plan and Services   Discharge Planning Services: CM Consult Post Acute Care Choice: Home Health                    HH Arranged: PT [facility] Agency: [place] ([facility]) Date [facility] Agency Contacted: 02/22/19 Time [place]: [phone] Representative spoke with at [place]: [place] ([place]) Interventions     Readmission Risk Interventions No flowsheet data found.

## 2019-02-22 NOTE — Discharge Summary (Signed)
[facility] [place] NAME: Kevin Gross    MR#:  [id number]  DATE OF BIRTH:  31-Oct-1955  DATE OF ADMISSION:  02/18/2019 ADMITTING PHYSICIAN: Christel Mormon, MD  DATE OF DISCHARGE: 02/22/2019   PRIMARY CARE PHYSICIAN: [name], NP    ADMISSION DIAGNOSIS:  Cellulitis of left lower extremity [L03.116] Bone infarct ([place]) [M87.9]  DISCHARGE DIAGNOSIS:  Active Problems:   Cellulitis of left lower extremity   SECONDARY DIAGNOSIS:   Past Medical History:  Diagnosis Date  . CHF (congestive heart failure) ([place])   . Clotting disorder (H[facility])    Bilateral legs  . COPD (chronic obstructive pulmonary disease) ([place])   . Hyperlipidemia   . Hypertension     HOSPITAL COURSE:   1.Left lower extremity cellulitis with  bone infarct, acute gout attack. -IV antibiotic therapy initiated with clindamycin-stop antibiotic as infection ruled out likely it was gout flare. -Podiatry, Dr. Vickki Muff, consulted for further evaluation  -We are managing pain with analgesic -Venous Doppler studies are negative for DVT - MRI confirms possible bone infarct but per podiatry said- this seems like gout- started colchicine. Stop Abx. -Given IM steroid 1 dose, also will give tapering oral steroid on discharge. Podiatry suggest special surgical boot and physical therapy evaluation. will discharge on oral colchicine and prednisone. Follow with Rheumatology and Podiatry clinic in 1 week, check renal function  2. CKD- 3 - With baseline BUN 30 and creatinine 1.64. We will continue to monitor renal function with repeated BMP in the a.m.   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 Alice Reichert outpatient  4. Hyperlipidemia -Continue Zocor   5. Hypertension -Lisinopril continued  DVT and PPI prophylaxis initiated  DISCHARGE CONDITIONS:   Stable.  CONSULTS OBTAINED:    DRUG ALLERGIES:  No Known  Allergies  DISCHARGE MEDICATIONS:   Allergies as of 02/22/2019   No Known Allergies     Medication List    STOP taking these medications   chlorthalidone 25 MG tablet Commonly known as: HYGROTON   lisinopril 10 MG tablet Commonly known as: ZESTRIL     TAKE these medications   acetaminophen 325 MG tablet Commonly known as: TYLENOL Take 2 tablets (650 mg total) by mouth every 6 (six) hours as needed for mild pain (or Fever >/= 101).   albuterol 108 (90 Base) MCG/ACT inhaler Commonly known as: VENTOLIN HFA Inhale into the lungs every 6 (six) hours as needed for wheezing or shortness of breath.   aspirin EC 81 MG tablet Take 81 mg by mouth daily.   atenolol-chlorthalidone 50-25 MG tablet Commonly known as: TENORETIC Take 1 tablet by mouth daily.   colchicine 0.6 MG tablet Take 1 tablet (0.6 mg total) by mouth 2 (two) times daily for 30 days.   ezetimibe 10 MG tablet Commonly known as: ZETIA Take 1 tablet (10 mg total) by mouth daily.   HYDROcodone-acetaminophen 5-325 MG tablet Commonly known as: NORCO/VICODIN Take 1 tablet by mouth every 6 (six) hours as needed for moderate pain or severe pain.   polyethylene glycol 17 g packet Commonly known as: MIRALAX / GLYCOLAX Take 17 g by mouth daily as needed for mild constipation.   predniSONE 10 MG (21) Tbpk tablet Commonly known as: STERAPRED UNI-PAK 21 TAB Take 6 tabs first day, 5 tab on day 2, then 4 on day 3rd, 3 tabs on day 4th , 2 tab on day 5th, and 1 tab on 6th day.  simvastatin 10 MG tablet Commonly known as: ZOCOR Take 1 tablet (10 mg total) by mouth daily. What changed:   medication strength  how much to take  Another medication with the same name was removed. Continue taking this medication, and follow the directions you see here.        DISCHARGE INSTRUCTIONS:    Follow with podiatry and rheumatology in 1-2 weeks.  If you experience worsening of your admission symptoms, develop shortness of  breath, life threatening emergency, suicidal or homicidal thoughts you must seek medical attention immediately by calling 911 or calling your MD immediately  if symptoms less severe.  You Must read complete instructions/literature along with all the possible adverse reactions/side effects for all the Medicines you take and that have been prescribed to you. Take any new Medicines after you have completely understood and accept all the possible adverse reactions/side effects.   Please note  You were cared for by a hospitalist during your hospital stay. If you have any questions about your discharge medications or the care you received while you were in the hospital after you are discharged, you can call the unit and asked to speak with the hospitalist on call if the hospitalist that took care of you is not available. Once you are discharged, your primary care physician will handle any further medical issues. Please note that NO REFILLS for any discharge medications will be authorized once you are discharged, as it is imperative that you return to your primary care physician (or establish a relationship with a primary care physician if you do not have one) for your aftercare needs so that they can reassess your need for medications and monitor your lab values.    Today   CHIEF COMPLAINT:   Chief Complaint  Patient presents with  . Leg Pain    HISTORY OF PRESENT ILLNESS:  Kevin Gross  is a 63 y.o. male with a known history of CHF, DVT of right lower extremity, COPD, hyperlipidemia, hypertension, liver cirrhosis.  He presents to the emergency room complaining of erythema, edema, and increasing pain of his left lower extremity, particularly his left ankle.  Symptoms have become worse over the last 4 days.  The pain has become severe with weightbearing to the point the patient reports he is unable to ambulate today.  Patient denies trauma to the area.  He denies fevers, nausea, vomiting, diarrhea,  abdominal pain, chest pain, shortness of breath.  Patient has experienced mild chills at times.  Doppler studies of left lower extremity were negative for DVT.  Left ankle x-ray demonstrates possible bone infarct in the talus with diffuse soft tissue edema.  WBC is 11.2.  BUN is 30 with creatinine 1.64.  He was started on IV clindamycin in the emergency room for cellulitis.  We have admitted him to the hospitalist service for further management.   VITAL SIGNS:  Blood pressure (!) 148/83, pulse 68, temperature 98.4 F (36.9 C), temperature source Oral, resp. rate 17, height 5\' 10"  (1.778 m), weight 132.5 kg, SpO2 98 %.  I/O:    Intake/Output Summary (Last 24 hours) at 02/22/2019 1054 Last data filed at 02/22/2019 0436 Gross per 24 hour  Intake 1030.49 ml  Output 300 ml  Net 730.49 ml    PHYSICAL EXAMINATION:   GENERAL:  63 y.o.-year-old patient lying in the bed with no acute distress.  EYES: Pupils equal, round, reactive to light and accommodation. No scleral icterus. Extraocular muscles intact.  HEENT: Head atraumatic, normocephalic. Oropharynx  and nasopharynx clear.  NECK:  Supple, no jugular venous distention. No thyroid enlargement, no tenderness.  LUNGS: Normal breath sounds bilaterally, no wheezing, rales,rhonchi or crepitation. No use of accessory muscles of respiration.  CARDIOVASCULAR: S1, S2 normal. No murmurs, rubs, or gallops.  ABDOMEN: Soft, nontender, nondistended. Bowel sounds present. No organomegaly or mass.  EXTREMITIES: No pedal edema, cyanosis, or clubbing.  Left ankle has slight swelling but no redness.  Distal pulses are palpable. NEUROLOGIC: Cranial nerves II through XII are intact. Muscle strength 5/5 in all extremities. Sensation intact. Gait not checked.  PSYCHIATRIC: The patient is alert and oriented x 3.  SKIN: No obvious rash, lesion, or ulcer.   DATA REVIEW:   CBC Recent Labs  Lab 02/18/19 0909  WBC 10.4  HGB 12.1*  HCT 38.3*  PLT 201     Chemistries  Recent Labs  Lab 02/17/19 2058  02/22/19 0301  NA 139   < > 139  K 4.4   < > 4.7  CL 103   < > 110  CO2 26   < > 23  GLUCOSE 103*   < > 103*  BUN 30*   < > 33*  CREATININE 1.64*   < > 1.32*  CALCIUM 9.0   < > 8.0*  AST 28  --   --   ALT 28  --   --   ALKPHOS 46  --   --   BILITOT 1.0  --   --    < > = values in this interval not displayed.    Cardiac Enzymes No results for input(s): TROPONINI in the last 168 hours.  Microbiology Results  Results for orders placed or performed during the hospital encounter of 02/18/19  Novel Coronavirus,NAA,(SEND-OUT TO REF LAB - TAT 24-48 hrs); Hosp Order     Status: None   Collection Time: 02/18/19  3:45 AM   Specimen: Nasopharyngeal Swab; Respiratory  Result Value Ref Range Status   SARS-CoV-2, NAA NOT DETECTED NOT DETECTED Final    Comment: (NOTE) This test was developed and its performance characteristics determined by [employer]. This test has not been FDA cleared or approved. This test has been authorized by FDA under an Emergency Use Authorization (EUA). This test is only authorized for the duration of time the declaration that circumstances exist justifying the authorization of the emergency use of in vitro diagnostic tests for detection of SARS-CoV-2 virus and/or diagnosis of COVID-19 infection under section 564(b)(1) of the Act, 21 U.S.C. 154MGQ-6(P)(6), unless the authorization is terminated or revoked sooner. When diagnostic testing is negative, the possibility of a false negative result should be considered in the context of a patient's recent exposures and the presence of clinical signs and symptoms consistent with COVID-19. An individual without symptoms of COVID-19 and who is not shedding SARS-CoV-2 virus would expect to have a negative (not detected) result in this assay. Performed  At: [facility] [place], [place] [id number] Rush Farmer MD TI:[id number]     [place]  Final    Comment: Performed at [facility], [address] [place], [place] [address]    RADIOLOGY:  No results found.  EKG:   Orders placed or performed in visit on 04/10/18  . EKG 12-Lead      Management plans discussed with the patient, family and they are in agreement.  CODE STATUS:     Code Status Orders  (From admission, onward)  Start     Ordered   02/18/19 0432  Full code  Continuous     02/18/19 0432        Code Status History    This patient has a current code status but no historical code status.   Advance Care Planning Activity      TOTAL TIME TAKING CARE OF THIS PATIENT: 35 minutes.    Vaughan Basta M.D on 02/22/2019 at 10:54 AM  Between 7am to 6pm - Pager - [phone]  After 6pm go to [email or web address] - password [email or web address] [facility]  Office  [phone]  CC: Primary care physician; [name], NP   Note: This dictation was prepared with Dragon dictation along with smaller phrase technology. Any transcriptional errors that result from this process are unintentional.

## 2019-02-22 NOTE — Plan of Care (Signed)
Pt is d/ced home.  He will f/u with rheumatologist for gout, and the podiatrist for bone infarct of L ankle.  Pt is able to ambulate with CAM boot.  Will review d/c paperwork, f/u appts and new medications.  Pt didn't have an IV at beginning of shift.  His family will pick him up at d/c.

## 2019-03-05 DIAGNOSIS — N183 Chronic kidney disease, stage 3 unspecified: Secondary | ICD-10-CM | POA: Insufficient documentation

## 2019-03-05 DIAGNOSIS — E790 Hyperuricemia without signs of inflammatory arthritis and tophaceous disease: Secondary | ICD-10-CM | POA: Insufficient documentation

## 2019-03-05 DIAGNOSIS — N1832 Chronic kidney disease, stage 3b: Secondary | ICD-10-CM | POA: Insufficient documentation

## 2019-03-21 ENCOUNTER — Other Ambulatory Visit: Payer: Self-pay | Admitting: Gastroenterology

## 2019-03-21 DIAGNOSIS — K7030 Alcoholic cirrhosis of liver without ascites: Secondary | ICD-10-CM

## 2019-04-08 ENCOUNTER — Ambulatory Visit
Admission: RE | Admit: 2019-04-08 | Discharge: 2019-04-08 | Disposition: A | Discharge status: Home / Self Care | Payer: Medicare PPO | Source: Ambulatory Visit | Attending: Gastroenterology | Admitting: Gastroenterology

## 2019-04-08 ENCOUNTER — Other Ambulatory Visit: Payer: Self-pay

## 2019-04-08 DIAGNOSIS — K7030 Alcoholic cirrhosis of liver without ascites: Secondary | ICD-10-CM | POA: Insufficient documentation

## 2019-04-16 DIAGNOSIS — M1A00X0 Idiopathic chronic gout, unspecified site, without tophus (tophi): Secondary | ICD-10-CM | POA: Insufficient documentation

## 2019-04-16 DIAGNOSIS — Z79899 Other long term (current) drug therapy: Secondary | ICD-10-CM | POA: Insufficient documentation

## 2019-10-26 ENCOUNTER — Other Ambulatory Visit: Payer: Self-pay

## 2019-10-26 ENCOUNTER — Emergency Department
Admission: EM | Admit: 2019-10-26 | Discharge: 2019-10-26 | Disposition: A | Discharge status: Home / Self Care | Payer: Medicare PPO | Attending: Emergency Medicine | Admitting: Emergency Medicine

## 2019-10-26 ENCOUNTER — Emergency Department: Payer: Medicare PPO

## 2019-10-26 ENCOUNTER — Encounter: Payer: Self-pay | Admitting: Emergency Medicine

## 2019-10-26 DIAGNOSIS — I2510 Atherosclerotic heart disease of native coronary artery without angina pectoris: Secondary | ICD-10-CM | POA: Insufficient documentation

## 2019-10-26 DIAGNOSIS — Z7982 Long term (current) use of aspirin: Secondary | ICD-10-CM | POA: Insufficient documentation

## 2019-10-26 DIAGNOSIS — J449 Chronic obstructive pulmonary disease, unspecified: Secondary | ICD-10-CM | POA: Diagnosis not present

## 2019-10-26 DIAGNOSIS — N183 Chronic kidney disease, stage 3 unspecified: Secondary | ICD-10-CM | POA: Insufficient documentation

## 2019-10-26 DIAGNOSIS — Z87891 Personal history of nicotine dependence: Secondary | ICD-10-CM | POA: Diagnosis not present

## 2019-10-26 DIAGNOSIS — Z79899 Other long term (current) drug therapy: Secondary | ICD-10-CM | POA: Insufficient documentation

## 2019-10-26 DIAGNOSIS — I130 Hypertensive heart and chronic kidney disease with heart failure and stage 1 through stage 4 chronic kidney disease, or unspecified chronic kidney disease: Secondary | ICD-10-CM | POA: Insufficient documentation

## 2019-10-26 DIAGNOSIS — M79605 Pain in left leg: Secondary | ICD-10-CM | POA: Diagnosis not present

## 2019-10-26 DIAGNOSIS — I509 Heart failure, unspecified: Secondary | ICD-10-CM | POA: Insufficient documentation

## 2019-10-26 DIAGNOSIS — R252 Cramp and spasm: Secondary | ICD-10-CM

## 2019-10-26 DIAGNOSIS — M79604 Pain in right leg: Secondary | ICD-10-CM | POA: Diagnosis not present

## 2019-10-26 DIAGNOSIS — R079 Chest pain, unspecified: Secondary | ICD-10-CM | POA: Diagnosis present

## 2019-10-26 LAB — CBC
HCT: 40.5 % (ref 39.0–52.0)
Hemoglobin: 13.1 g/dL (ref 13.0–17.0)
MCH: 33.6 pg (ref 26.0–34.0)
MCHC: 32.3 g/dL (ref 30.0–36.0)
MCV: 103.8 fL — ABNORMAL HIGH (ref 80.0–100.0)
Platelets: 203 10*3/uL (ref 150–400)
RBC: 3.9 MIL/uL — ABNORMAL LOW (ref 4.22–5.81)
RDW: 13.9 % (ref 11.5–15.5)
WBC: 9.8 10*3/uL (ref 4.0–10.5)
nRBC: 0 % (ref 0.0–0.2)

## 2019-10-26 LAB — BASIC METABOLIC PANEL
Anion gap: 7 (ref 5–15)
BUN: 33 mg/dL — ABNORMAL HIGH (ref 8–23)
CO2: 29 mmol/L (ref 22–32)
Calcium: 9.4 mg/dL (ref 8.9–10.3)
Chloride: 101 mmol/L (ref 98–111)
Creatinine, Ser: 1.52 mg/dL — ABNORMAL HIGH (ref 0.61–1.24)
GFR calc Af Amer: 56 mL/min — ABNORMAL LOW (ref >60)
GFR calc non Af Amer: 48 mL/min — ABNORMAL LOW (ref >60)
Glucose, Bld: 98 mg/dL (ref 70–99)
Potassium: 3.7 mmol/L (ref 3.5–5.1)
Sodium: 137 mmol/L (ref 135–145)

## 2019-10-26 LAB — URINALYSIS, COMPLETE (UACMP) WITH MICROSCOPIC
Bacteria, UA: NONE SEEN
Bilirubin Urine: NEGATIVE
Glucose, UA: NEGATIVE mg/dL
Hgb urine dipstick: NEGATIVE
Ketones, ur: NEGATIVE mg/dL
Leukocytes,Ua: NEGATIVE
Nitrite: NEGATIVE
Protein, ur: 30 mg/dL — AB
Specific Gravity, Urine: 1.023 (ref 1.005–1.030)
pH: 5 (ref 5.0–8.0)

## 2019-10-26 LAB — TROPONIN I (HIGH SENSITIVITY)
Troponin I (High Sensitivity): 12 ng/L (ref <18)
Troponin I (High Sensitivity): 11 ng/L (ref <18)

## 2019-10-26 LAB — CK: Total CK: 295 U/L (ref 49–397)

## 2019-10-26 LAB — FIBRIN DERIVATIVES D-DIMER (ARMC ONLY): Fibrin derivatives D-dimer (ARMC): 4934.25 ng/mL (FEU) — ABNORMAL HIGH (ref 0.00–499.00)

## 2019-10-26 MED ORDER — SODIUM CHLORIDE 0.9 % IV BOLUS
500.0000 mL | Freq: Once | INTRAVENOUS | Status: AC
  Administered 2019-10-26: 06:00:00 500 mL via INTRAVENOUS

## 2019-10-26 MED ORDER — DIAZEPAM 2 MG PO TABS
2.0000 mg | ORAL_TABLET | Freq: Once | ORAL | Status: AC
  Administered 2019-10-26: 06:00:00 2 mg via ORAL
  Filled 2019-10-26: qty 1

## 2019-10-26 MED ORDER — IOHEXOL 350 MG/ML SOLN
100.0000 mL | Freq: Once | INTRAVENOUS | Status: AC | PRN
  Administered 2019-10-26: 07:00:00 100 mL via INTRAVENOUS

## 2019-10-26 NOTE — ED Notes (Signed)
[place] to patient bedside

## 2019-10-26 NOTE — ED Notes (Signed)
Pt to XRAY

## 2019-10-26 NOTE — ED Provider Notes (Signed)
Asked to follow up on 2nd troponin, [place] and CTA.   Troponin has decreased, [place] and CTA reassuring. Patient well appearing, appropriate for d/c   Lavonia Drafts, MD 10/26/19 [phone]

## 2019-10-26 NOTE — ED Notes (Addendum)
Patient reports lack of sleep, states he has experienced leg cramping and chest pain since yesterday. Patient states he has been drinking "a lot" of water in an attempt to stay hydrated. Patient SOB or cough that is new, does reports COPD. States he has history of cardiac cath and no longer takes a blood thinner but has not in several years.

## 2019-10-26 NOTE — ED Triage Notes (Signed)
Pt arrives POV to triage with c/o bilateral leg cramping and chest pressure. Pt states that since last Sunday he has only been able to sleep a total of around 3 hours.

## 2019-10-26 NOTE — ED Notes (Signed)
Urine requested from patient. Patient states that he does not need to urinate at this time. Patient educated on use of urinal in bed. States he believes he will be able to provide sample after fluid bolus. Will continue to monitor.

## 2019-10-26 NOTE — ED Provider Notes (Signed)
[facility] Emergency Department Provider Note   ____________________________________________   First MD Initiated Contact with Patient 10/26/19 [phone]     (approximate)  I have reviewed the triage vital signs and the nursing notes.   HISTORY  Chief Complaint Chest Pain    HPI Kevin Gross is a 64 y.o. male who presents to the ED from home with a chief complaint of bilateral leg cramping and chest pain.  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  Denies exertional activities or working outside (as triage note states).  States he has been unable to sleep very much because of the bilateral leg cramps.  Reports he is the primary caregiver for his wife and wanted to get the leg cramps checked out.  States last evening he had a brief 5 to 10-second episode of sharp left-sided chest pains.  Denies associated diaphoresis, nausea/vomiting, palpitations or dizziness.  Has been feeling more short of breath than usual.  Denies abdominal pain, dysuria or diarrhea.  Denies recent travel, trauma or hormone use.  Denies COVID-19 exposure.       Past Medical History:  Diagnosis Date  . CHF (congestive heart failure) ([place])   . Clotting disorder (HCC)    Bilateral legs  . COPD (chronic obstructive pulmonary disease) ([place])   . Hyperlipidemia   . Hypertension     Patient Active Problem List   Diagnosis Date Noted  . Cellulitis of left lower extremity 02/18/2019  . Aortic atherosclerosis ([place]) 04/10/2018  . Mixed hyperlipidemia 04/10/2018  . Morbid obesity ([place]) 04/10/2018  . CAD (coronary artery disease), native coronary artery 04/08/2018  . COPD (chronic obstructive pulmonary disease) ([place]) 04/08/2018  . CRI (chronic renal insufficiency), stage 3 (moderate) 04/08/2018    Past Surgical History:  Procedure Laterality Date  .  CARDIAC CATHETERIZATION     [facility]  . COLONOSCOPY    . COLONOSCOPY WITH PROPOFOL N/A 01/17/2018   Procedure: COLONOSCOPY WITH PROPOFOL;  Surgeon: Toledo, Benay Pike, MD;  Location: [facility] ENDOSCOPY;  Service: Gastroenterology;  Laterality: N/A;  . ESOPHAGOGASTRODUODENOSCOPY (EGD) WITH PROPOFOL N/A 01/17/2018   Procedure: ESOPHAGOGASTRODUODENOSCOPY (EGD) WITH PROPOFOL;  Surgeon: Toledo, Benay Pike, MD;  Location: [facility] ENDOSCOPY;  Service: Gastroenterology;  Laterality: N/A;  . ORIF ANKLE FRACTURE Right     Prior to Admission medications   Medication Sig Start Date End Date Taking? Authorizing Provider  acetaminophen (TYLENOL) 325 MG tablet Take 2 tablets (650 mg total) by mouth every 6 (six) hours as needed for mild pain (or Fever >/= 101). 02/22/19   Vaughan Basta, MD  albuterol (PROVENTIL HFA;VENTOLIN HFA) 108 (90 Base) MCG/ACT inhaler Inhale into the lungs every 6 (six) hours as needed for wheezing or shortness of breath.    [provider]  aspirin EC 81 MG tablet Take 81 mg by mouth daily.    [provider]  atenolol-chlorthalidone (TENORETIC) 50-25 MG tablet Take 1 tablet by mouth daily.    [provider]  colchicine 0.6 MG tablet Take 1 tablet (0.6 mg total) by mouth 2 (two) times daily for 30 days. 02/22/19 03/24/19  Vaughan Basta, MD  ezetimibe (ZETIA) 10 MG tablet Take 1 tablet (10 mg total) by mouth daily. 04/10/18   Minna Merritts, MD  HYDROcodone-acetaminophen (NORCO/VICODIN) 5-325 MG tablet Take 1 tablet by mouth every 6 (six) hours as needed for moderate pain or severe pain.  02/22/19   Vaughan Basta, MD  polyethylene glycol (MIRALAX / GLYCOLAX) 17 g packet Take 17 g by mouth daily as needed for mild constipation. 02/22/19   Vaughan Basta, MD  predniSONE (STERAPRED UNI-PAK 21 TAB) 10 MG (21) TBPK tablet Take 6 tabs first day, 5 tab on day 2, then 4 on day 3rd, 3 tabs on day 4th , 2 tab on day 5th, and 1 tab on 6th day. 02/22/19    Vaughan Basta, MD  simvastatin (ZOCOR) 10 MG tablet Take 1 tablet (10 mg total) by mouth daily. 02/22/19   Vaughan Basta, MD    Allergies Patient has no known allergies.  Family History  Problem Relation Age of Onset  . Heart disease Mother   . Heart disease Brother     Social History Social History   Tobacco Use  . Smoking status: Former Smoker    Packs/day: 1.00    Years: 47.00    Pack years: 47.00    Quit date: 2016    Years since quitting: 5.1  . Smokeless tobacco: Never Used  Substance Use Topics  . Alcohol use: Yes    Comment: occasionally  . Drug use: No    Review of Systems  Constitutional: No fever/chills Eyes: No visual changes. ENT: No sore throat. Cardiovascular: Positive for chest pain. Respiratory: Denies shortness of breath. Gastrointestinal: No abdominal pain.  No nausea, no vomiting.  No diarrhea.  No constipation. Genitourinary: Negative for dysuria. Musculoskeletal: Positive for BLE leg cramps.  Negative for back pain. Skin: Negative for rash. Neurological: Negative for headaches, focal weakness or numbness.   ____________________________________________   PHYSICAL EXAM:  VITAL SIGNS: ED Triage Vitals  Enc Vitals Group     BP 10/26/19 0550 (!) 178/98     Pulse Rate 10/26/19 0550 60     Resp 10/26/19 0550 20     Temp 10/26/19 0550 99.5 F (37.5 C)     Temp Source 10/26/19 0550 Oral     SpO2 10/26/19 0550 100 %     Weight 10/26/19 0546 300 lb (136.1 kg)     Height 10/26/19 0546 5\' 11"  (1.803 m)     Head Circumference --      Peak Flow --      Pain Score 10/26/19 0546 3     Pain Loc --      Pain Edu? --      Excl. in [place]? --     Constitutional: Alert and oriented. Well appearing and in no acute distress. Eyes: Conjunctivae are normal. PERRL. EOMI. Head: Atraumatic. Nose: No congestion/rhinnorhea. Mouth/Throat: Mucous membranes are moist.  Oropharynx non-erythematous. Neck: No stridor.   Cardiovascular: Normal  rate, regular rhythm. Grossly normal heart sounds.  Good peripheral circulation. Respiratory: Normal respiratory effort.  No retractions. Lungs CTAB. Gastrointestinal: Soft and nontender. No distention. No abdominal bruits. No CVA tenderness. Musculoskeletal: No lower extremity tenderness.  1+ BLE nonpitting edema.  2+ femoral distal pulses.  Symmetrically warm limbs without evidence for ischemia.  Calves supple bilaterally.  No joint effusions. Neurologic:  Normal speech and language. No gross focal neurologic deficits are appreciated. No gait instability. Skin:  Skin is warm, dry and intact. No rash noted. Psychiatric: Mood and affect are normal. Speech and behavior are normal.  ____________________________________________   LABS (all labs ordered are listed, but only abnormal results are displayed)  Labs Reviewed  BASIC METABOLIC PANEL - Abnormal; Notable for the following components:      Result Value   BUN  33 (*)    Creatinine, Ser 1.52 (*)    GFR calc non Af Amer 48 (*)    GFR calc Af Amer 56 (*)    All other components within normal limits  CBC - Abnormal; Notable for the following components:   RBC 3.90 (*)    MCV 103.8 (*)    All other components within normal limits  FIBRIN DERIVATIVES D-DIMER ([facility] ONLY) - Abnormal; Notable for the following components:   Fibrin derivatives D-dimer [facility]) [id number] (*)    All other components within normal limits  CK  URINALYSIS, COMPLETE (UACMP) WITH MICROSCOPIC  TROPONIN I (HIGH SENSITIVITY)   ____________________________________________  EKG  ED ECG REPORT I, Barry Culverhouse J, the attending physician, personally viewed and interpreted this ECG.   Date: 10/26/2019  EKG Time: 0547  Rate: 53  Rhythm: Sinus bradycardia  Axis: Normal  Intervals:none  ST&T Change: Nonspecific  ED ECG REPORT I, Aava Deland J, the attending physician, personally viewed and interpreted this ECG.   Date: 10/26/2019  EKG Time: 0632  Rate: 51  Rhythm:  sinus bradycardia  Axis: Normal  Intervals:none  ST&T Change: Nonspecific   ____________________________________________  RADIOLOGY  ED MD interpretation: No acute cardiopulmonary process; CT chest pending  Official radiology report(s): DG Chest 2 View  Result Date: 10/26/2019 CLINICAL DATA:  Chest pain EXAM: CHEST - 2 VIEW COMPARISON:  06/28/2017 FINDINGS: Heart size and pulmonary vascularity normal. Mild hyperinflation lungs. Lungs well aerated and clear without infiltrate effusion or mass. IMPRESSION: No active cardiopulmonary disease. Electronically Signed   By: Franchot Gallo M.D.   On: 10/26/2019 06:34    ____________________________________________   PROCEDURES  Procedure(s) performed (including Critical Care):  Procedures   ____________________________________________   INITIAL IMPRESSION / ASSESSMENT AND PLAN / ED COURSE  As part of my medical decision making, I reviewed the following data within the [place] notes reviewed and incorporated, Labs reviewed, EKG interpreted, Old chart reviewed, Radiograph reviewed and Notes from prior ED visits     CORVON KEMPKER was evaluated in Emergency Department on 10/26/2019 for the symptoms described in the history of present illness. He was evaluated in the context of the global COVID-19 pandemic, which necessitated consideration that the patient might be at risk for infection with the SARS-CoV-2 virus that causes COVID-19. Institutional protocols and algorithms that pertain to the evaluation of patients at risk for COVID-19 are in a state of rapid change based on information released by regulatory bodies including the CDC and federal and state organizations. These policies and algorithms were followed during the patient's care in the ED.    64 year old male presenting with bilateral leg cramps and brief chest discomfort. Differential diagnosis includes, but is not limited to, ACS, aortic dissection,  pulmonary embolism, cardiac tamponade, pneumothorax, pneumonia, pericarditis, myocarditis, GI-related causes including esophagitis/gastritis, and musculoskeletal chest wall pain.    Will obtain cardiac work-up, D-dimer, BLE Doppler ultrasounds to evaluate for DVT.  Oral Valium for muscle relaxation.  Will reassess.  Sinus bradycardia noted on EKG; patient takes beta-blockers.   Clinical Course as of Oct 25 654  Sat Oct 26, 2019  0651 Doppler ultrasounds currently being performed in the room.  Elevated D-dimer noted.  Will obtain CTA chest to evaluate for PE.  Care transferred to Dr. Wynelle Link at change of shift pending results of Doppler ultrasound, CT chest, repeat troponin at 7:45 AM and urinalysis.   [JS]    Clinical Course User Index [JS] Paulette Blanch, MD  ____________________________________________   FINAL CLINICAL IMPRESSION(S) / ED DIAGNOSES  Final diagnoses:  Nonspecific chest pain  Bilateral leg cramps     ED Discharge Orders    None       Note:  This document was prepared using Dragon voice recognition software and may include unintentional dictation errors.   Paulette Blanch, MD 10/26/19 [phone]

## 2019-10-26 NOTE — ED Notes (Signed)
Repeat EKG at [phone]

## 2019-12-03 ENCOUNTER — Other Ambulatory Visit
Admission: RE | Admit: 2019-12-03 | Discharge: 2019-12-03 | Disposition: A | Discharge status: Home / Self Care | Payer: Medicare PPO | Source: Ambulatory Visit | Attending: Family | Admitting: Family

## 2019-12-03 ENCOUNTER — Other Ambulatory Visit: Payer: Self-pay

## 2019-12-03 DIAGNOSIS — Z01812 Encounter for preprocedural laboratory examination: Secondary | ICD-10-CM | POA: Insufficient documentation

## 2019-12-03 DIAGNOSIS — Z20822 Contact with and (suspected) exposure to covid-19: Secondary | ICD-10-CM | POA: Diagnosis not present

## 2019-12-03 LAB — SARS CORONAVIRUS 2 (TAT 6-24 HRS): SARS Coronavirus 2: NEGATIVE

## 2019-12-05 ENCOUNTER — Ambulatory Visit: Payer: Medicare PPO | Attending: Neurology

## 2019-12-05 DIAGNOSIS — J449 Chronic obstructive pulmonary disease, unspecified: Secondary | ICD-10-CM | POA: Diagnosis not present

## 2019-12-05 DIAGNOSIS — I110 Hypertensive heart disease with heart failure: Secondary | ICD-10-CM | POA: Diagnosis not present

## 2019-12-05 DIAGNOSIS — I509 Heart failure, unspecified: Secondary | ICD-10-CM | POA: Diagnosis not present

## 2019-12-05 DIAGNOSIS — G4733 Obstructive sleep apnea (adult) (pediatric): Secondary | ICD-10-CM | POA: Diagnosis present

## 2019-12-05 DIAGNOSIS — Z87891 Personal history of nicotine dependence: Secondary | ICD-10-CM | POA: Diagnosis not present

## 2019-12-06 ENCOUNTER — Other Ambulatory Visit: Payer: Self-pay

## 2019-12-12 ENCOUNTER — Ambulatory Visit
Admission: RE | Admit: 2019-12-12 | Discharge: 2019-12-12 | Disposition: A | Discharge status: Home / Self Care | Payer: Medicare PPO | Source: Ambulatory Visit | Attending: Physician Assistant | Admitting: Physician Assistant

## 2019-12-12 ENCOUNTER — Other Ambulatory Visit: Payer: Self-pay | Admitting: Physician Assistant

## 2019-12-12 DIAGNOSIS — M25551 Pain in right hip: Secondary | ICD-10-CM | POA: Insufficient documentation

## 2020-01-13 ENCOUNTER — Other Ambulatory Visit: Payer: Self-pay | Admitting: Gastroenterology

## 2020-01-13 DIAGNOSIS — K7030 Alcoholic cirrhosis of liver without ascites: Secondary | ICD-10-CM

## 2020-01-24 ENCOUNTER — Other Ambulatory Visit: Payer: Self-pay

## 2020-01-24 ENCOUNTER — Ambulatory Visit
Admission: RE | Admit: 2020-01-24 | Discharge: 2020-01-24 | Disposition: A | Discharge status: Home / Self Care | Payer: Medicare PPO | Source: Ambulatory Visit | Attending: Gastroenterology | Admitting: Gastroenterology

## 2020-01-24 DIAGNOSIS — K7030 Alcoholic cirrhosis of liver without ascites: Secondary | ICD-10-CM | POA: Insufficient documentation

## 2020-02-03 ENCOUNTER — Telehealth: Payer: Self-pay

## 2020-02-03 DIAGNOSIS — Z87891 Personal history of nicotine dependence: Secondary | ICD-10-CM

## 2020-02-03 DIAGNOSIS — Z122 Encounter for screening for malignant neoplasm of respiratory organs: Secondary | ICD-10-CM

## 2020-02-03 NOTE — Telephone Encounter (Signed)
Message left notifying patient that it is time to schedule the low dose lung cancer screening CT scan.  Instructed patient to return call to Shawn Perkins at [phone] to verify information prior to CT scan being scheduled.    

## 2020-02-04 NOTE — Telephone Encounter (Signed)
Patient has been notified that annual lung cancer screening low dose CT scan is due currently or will be in near future. Confirmed that patient is within the age range of 55-77, and asymptomatic, (no signs or symptoms of lung cancer). Patient denies illness that would prevent curative treatment for lung cancer if found. Verified smoking history, (former quit 2016, 47 pack year). The shared decision making visit was done 12/12/17. Patient is agreeable for CT scan being scheduled.

## 2020-02-04 NOTE — Addendum Note (Signed)
Addended by: Lieutenant Diego on: 02/04/2020 10:54 AM   Modules accepted: Orders

## 2020-02-05 IMAGING — MR MRI OF THE LEFT ANKLE WITHOUT CONTRAST
5 series · 40 of 40 positions shown · non-contrast
Comparison: None.

CLINICAL DATA: Ankle pain and swelling.

EXAM:
MRI OF THE LEFT ANKLE WITHOUT CONTRAST
TECHNIQUE: Multiplanar, multisequence MR imaging of the ankle was performed. No
intravenous contrast was administered.

[Series 4: PD fat-sat · axial · left · 3.0mm · 0.50mm/px · z∈[-88,+68]mm · 10 of 40 slices shown]
[im 1/40]
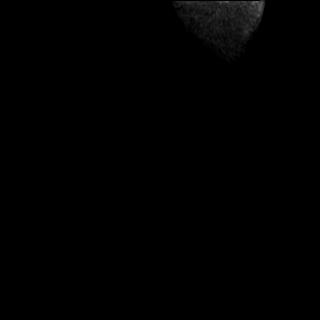
[im 5/40]
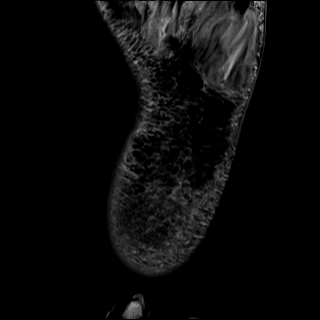
[im 9/40]
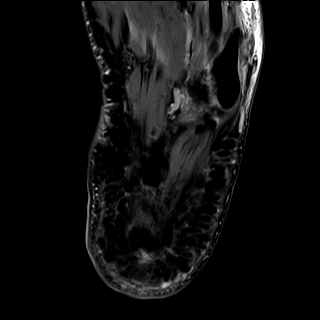
[im 14/40]
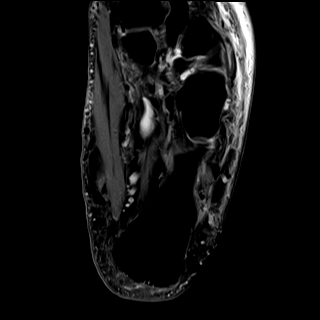
[im 18/40]
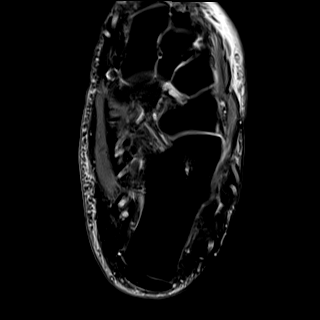
[im 22/40]
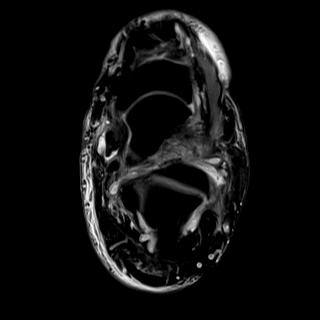
[im 27/40]
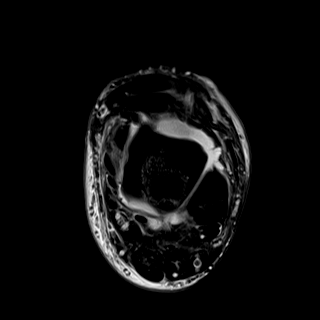
[im 31/40]
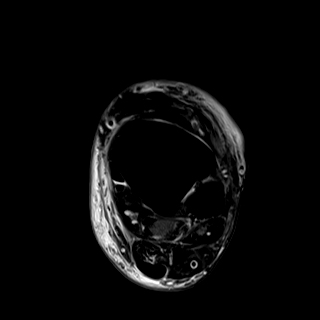
[im 35/40]
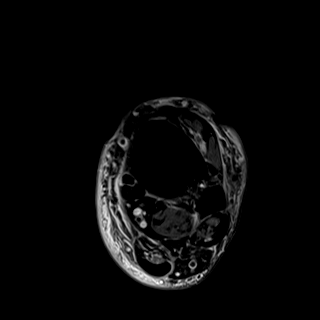
[im 40/40]
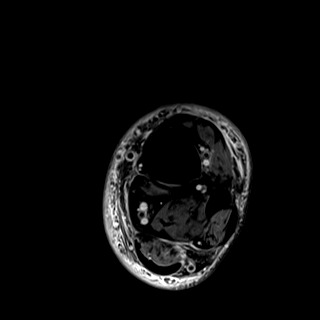

[Series 5: T2 fat-sat · axial · left · 3.0mm · 0.50mm/px · z∈[-88,+68]mm · 9 of 40 slices shown (1 of 2)]
[im 1/40]
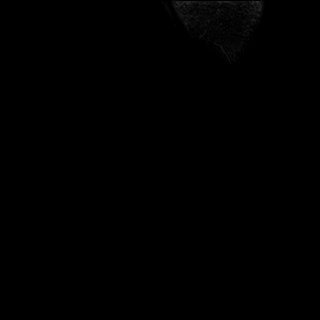
[im 5/40]
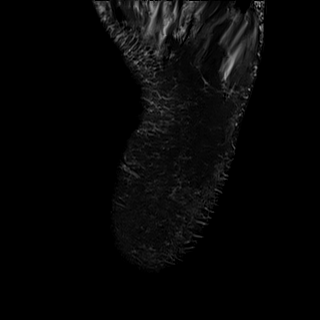
[im 10/40]
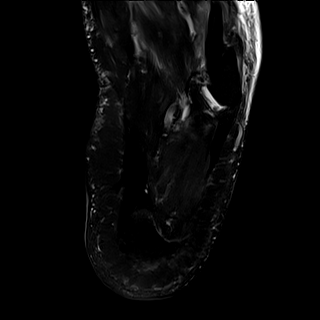
[im 15/40]
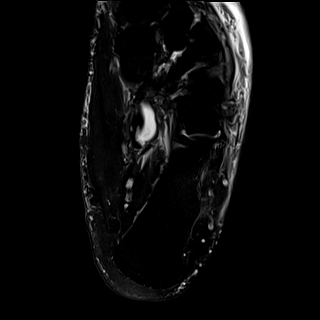
[im 20/40]
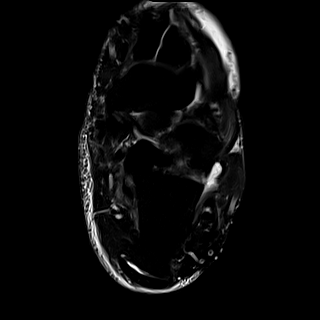
[im 25/40]
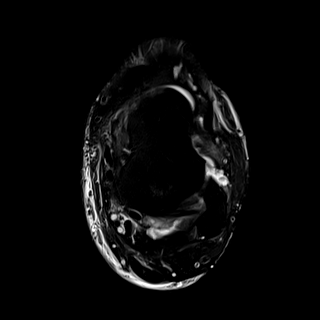
[im 30/40]
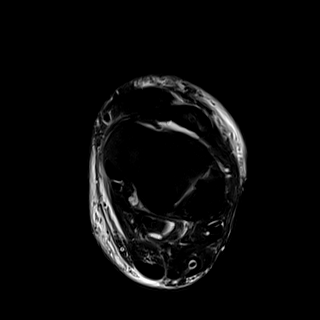
[im 35/40]
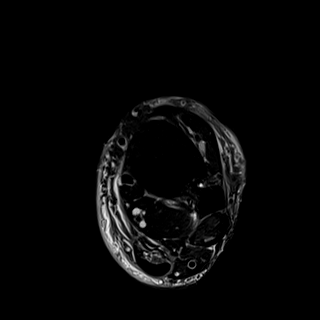
[im 40/40]
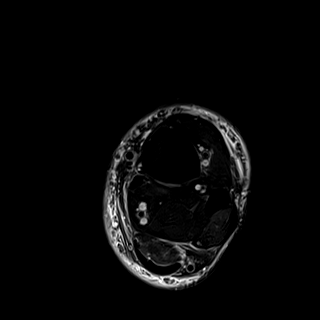

[Series 6: T2 fat-sat · coronal · left · 3.0mm · 0.62mm/px · 9 of 40 slices shown (2 of 2)]
[im 1/40]
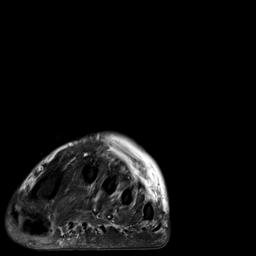
[im 5/40]
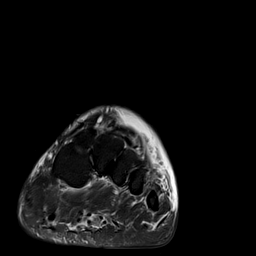
[im 10/40]
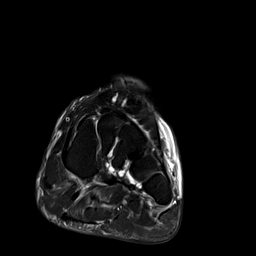
[im 15/40]
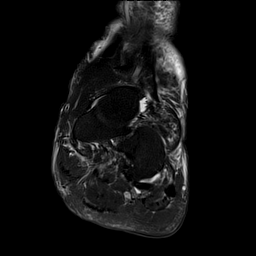
[im 20/40]
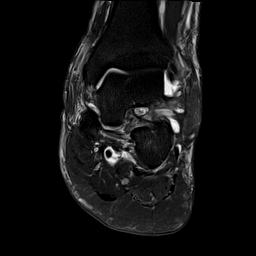
[im 25/40]
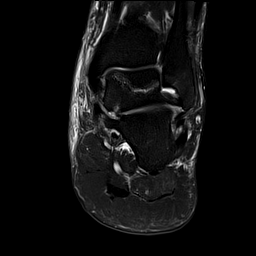
[im 30/40]
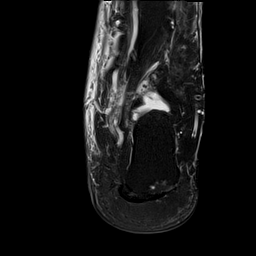
[im 35/40]
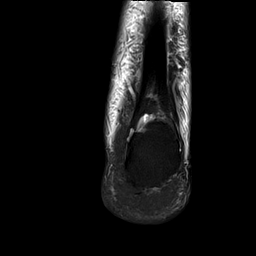
[im 40/40]
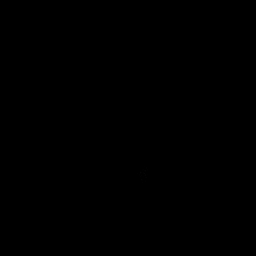

[Series 7: T1 · sagittal · left · 4.0mm · 0.70mm/px · 6 of 27 slices shown]
[im 1/27]
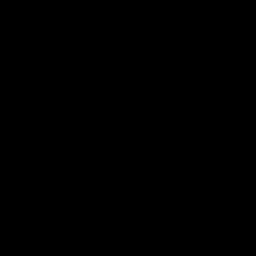
[im 6/27]
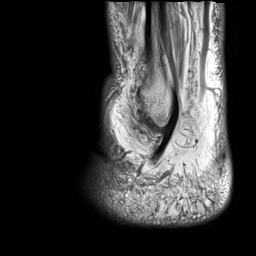
[im 11/27]
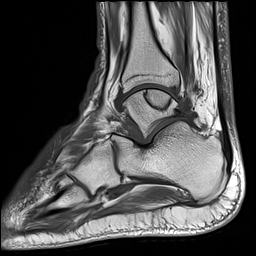
[im 16/27]
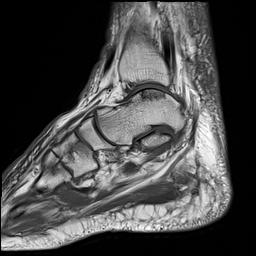
[im 21/27]
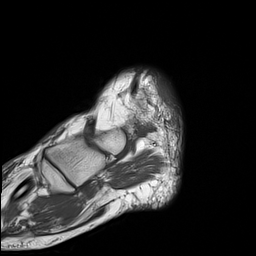
[im 27/27]
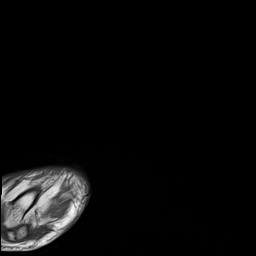

[Series 8: STIR · sagittal · left · 4.0mm · 0.35mm/px · 6 of 26 slices shown]
[im 1/26]
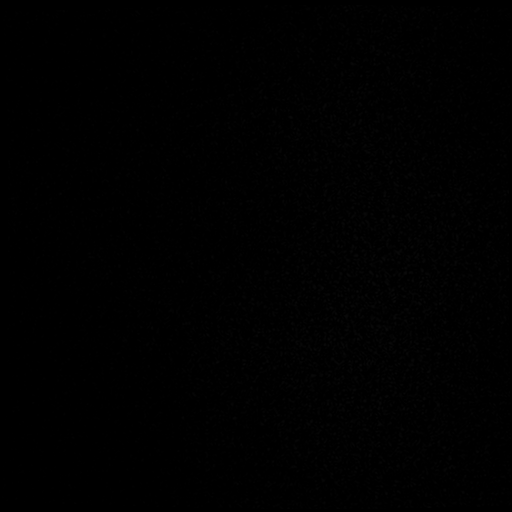
[im 6/26]
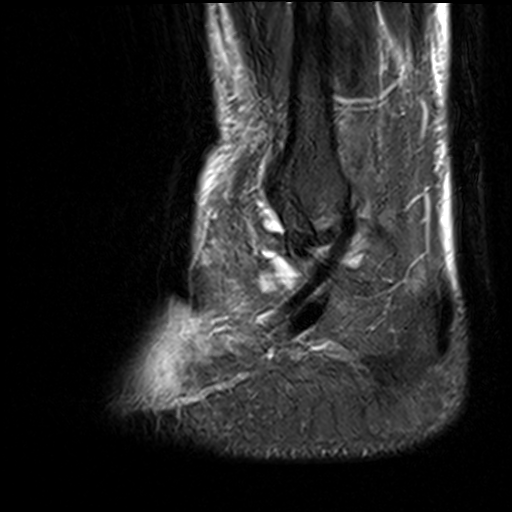
[im 11/26]
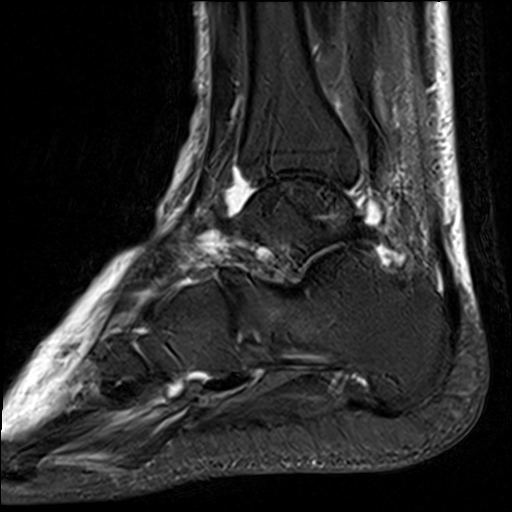
[im 16/26]
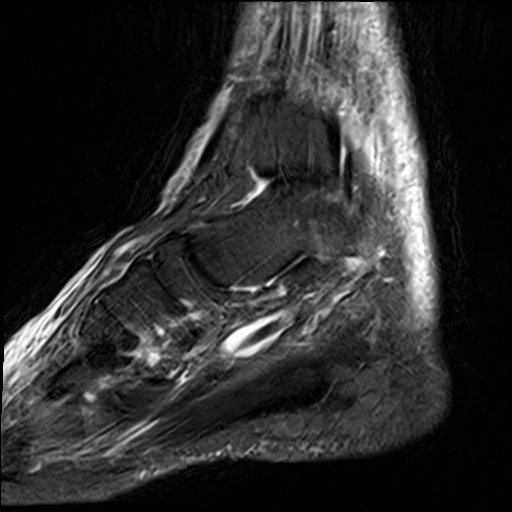
[im 21/26]
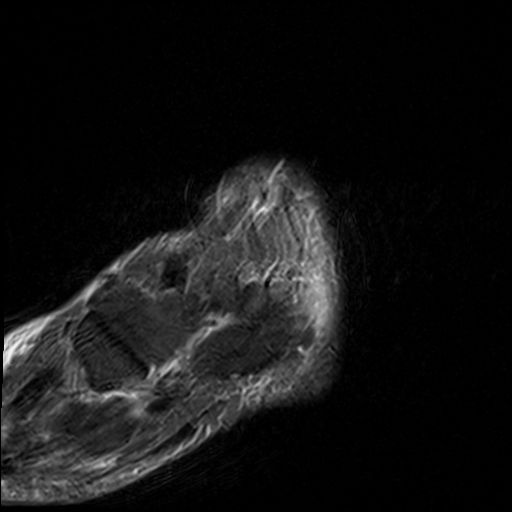
[im 26/26]
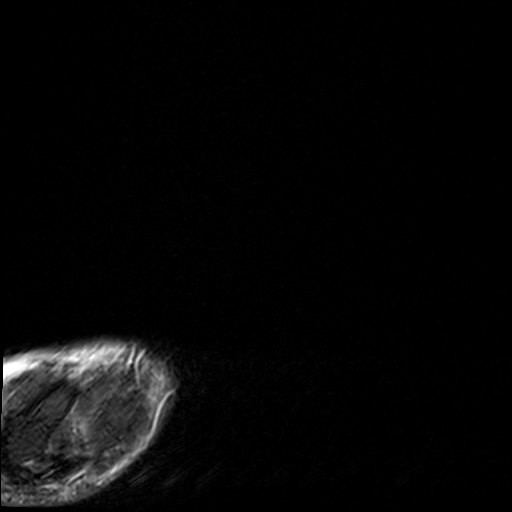

[40 of 40 positions shown; findings below may reference images not displayed]

FINDINGS: TENDONS

Peroneal: Intact. No significant tendinopathy or tendon tear. Mild
tenosynovitis involving the peroneus brevis tendon.

Posteromedial: Mild tendinopathy and moderate tenosynovitis
involving the posterior tibialis tendon.

Anterior: Intact

Achilles: Normal

Plantar Fascia: Intact

LIGAMENTS

Lateral: Intact

Medial: Intact

CARTILAGE

Ankle Joint: There is a moderate-sized ankle joint effusion. The
articular cartilage appears intact. There is a large subchondral
bone infarct involving the talar dome with minimal surrounding edema
and a small amount of fluid.

Subtalar Joints/Sinus Tarsi: The subtalar joints are maintained.
Normal articular cartilage. Small joint effusion in the posterior
talocalcaneal facet. There is mild edema in the sinus tarsi but the
cervical and interosseous ligaments are intact. The spring ligament
is intact.

Bones: Large bone infarct/osteonecrosis involving the talar dome.
This is not and osteochondral lesion. No overlying cartilage defect
or obvious defect in the subchondral plate.

The other bony structures are intact.  No stress fracture.

Other: There is diffuse subcutaneous soft tissue
swelling/edema/fluid of uncertain significance or etiology. It could
reflect cellulitis. No findings for myofasciitis or pyomyositis. No
findings for septic arthritis or osteomyelitis.
IMPRESSION: 1. 28 x 22 mm bone infarct/osteonecrosis in the talus. No
osteochondral lesion, cartilage defect or erosive changes. There is
a moderate-sized ankle joint effusion.
2. Diffuse subcutaneous soft tissue swelling/edema/fluid.
3. Intact medial and lateral ankle ligaments and tendons.

## 2020-02-12 ENCOUNTER — Ambulatory Visit: Payer: Medicare PPO | Attending: Oncology

## 2020-02-26 ENCOUNTER — Telehealth: Payer: Self-pay | Admitting: *Deleted

## 2020-02-26 NOTE — Telephone Encounter (Signed)
Attempted to schedule lung screening scan. Person answering phone reports patient is out of town at funeral and to call back at a later date.

## 2020-03-23 ENCOUNTER — Telehealth: Payer: Self-pay

## 2020-03-23 NOTE — Telephone Encounter (Signed)
Contacted patient to schedule next lung screening CT scan.  Left message for patient to call Burgess Estelle, navigator to schedule scan.

## 2020-03-31 ENCOUNTER — Telehealth: Payer: Self-pay | Admitting: *Deleted

## 2020-03-31 NOTE — Telephone Encounter (Signed)
Pt has been notified that lung cancer screening CT scan is due currently or will be in near future. Confirmed pt is within appropriate age range, and asymptomatic. Pt denies illness that would prevent curative treatment for lung cancer if found. Verified smoking history (Former Smoker since 2015, 1-1.5 ppd). Pt has not yet received the COVID VX. Pt is agreeable for CT scan being scheduled and prefers afternoon, around 2-2:30 or later. Day of the week does not matter.

## 2020-04-01 NOTE — Telephone Encounter (Signed)
Left voicemail in attempt to give appt.

## 2020-04-02 NOTE — Telephone Encounter (Signed)
Patient called back and given appt for tomorrow afternoon.

## 2020-04-03 ENCOUNTER — Other Ambulatory Visit: Payer: Self-pay

## 2020-04-03 ENCOUNTER — Ambulatory Visit
Admission: RE | Admit: 2020-04-03 | Discharge: 2020-04-03 | Disposition: A | Discharge status: Home / Self Care | Payer: Medicare PPO | Source: Ambulatory Visit | Attending: Oncology | Admitting: Oncology

## 2020-04-03 DIAGNOSIS — Z87891 Personal history of nicotine dependence: Secondary | ICD-10-CM | POA: Diagnosis present

## 2020-04-03 DIAGNOSIS — Z122 Encounter for screening for malignant neoplasm of respiratory organs: Secondary | ICD-10-CM

## 2020-04-13 ENCOUNTER — Encounter: Payer: Self-pay | Admitting: *Deleted

## 2020-10-19 ENCOUNTER — Telehealth: Payer: Self-pay | Admitting: Cardiovascular Disease

## 2020-10-19 NOTE — Telephone Encounter (Signed)
3 attempts to schedule fu appt from recall list.   Deleting recall.   

## 2021-01-28 ENCOUNTER — Other Ambulatory Visit: Payer: Self-pay | Admitting: Urology

## 2021-01-28 DIAGNOSIS — R972 Elevated prostate specific antigen [PSA]: Secondary | ICD-10-CM

## 2021-02-09 ENCOUNTER — Ambulatory Visit: Payer: Medicare PPO

## 2021-02-16 ENCOUNTER — Ambulatory Visit: Payer: Medicare PPO

## 2021-02-24 ENCOUNTER — Ambulatory Visit
Admission: RE | Admit: 2021-02-24 | Discharge: 2021-02-24 | Disposition: A | Discharge status: Home / Self Care | Payer: Medicare PPO | Source: Ambulatory Visit | Attending: Urology | Admitting: Urology

## 2021-02-24 ENCOUNTER — Other Ambulatory Visit: Payer: Self-pay

## 2021-02-24 DIAGNOSIS — R972 Elevated prostate specific antigen [PSA]: Secondary | ICD-10-CM | POA: Insufficient documentation

## 2021-02-24 MED ORDER — GADOBUTROL 1 MMOL/ML IV SOLN
10.0000 mL | Freq: Once | INTRAVENOUS | Status: AC | PRN
  Administered 2021-02-24: 10 mL via INTRAVENOUS

## 2021-03-24 ENCOUNTER — Encounter
Admission: RE | Admit: 2021-03-24 | Discharge: 2021-03-24 | Disposition: A | Discharge status: Home / Self Care | Payer: Medicare PPO | Source: Ambulatory Visit | Attending: Urology | Admitting: Urology

## 2021-03-24 ENCOUNTER — Other Ambulatory Visit: Payer: Self-pay

## 2021-03-24 HISTORY — DX: Alcoholic cirrhosis of liver without ascites: K70.30

## 2021-03-24 HISTORY — DX: Prediabetes: R73.03

## 2021-03-24 HISTORY — DX: Sleep apnea, unspecified: G47.30

## 2021-03-24 HISTORY — DX: Other complications of anesthesia, initial encounter: T88.59XA

## 2021-03-24 NOTE — H&P (Signed)
NAME: Kevin Gross, KNEECE MEDICAL RECORD NO: [id number] ACCOUNT NO: [id number] DATE OF BIRTH: Jun 16, 1956 FACILITY: [facility] LOCATION: [facility] PHYSICIAN: Otelia Limes. Yves Dill, MD  History and Physical   DATE OF ADMISSION: 04/01/2021  Same day surgery 04/01/2021.  CHIEF COMPLAINT:  Elevated PSA and abnormal prostate MRI scan.  HISTORY OF PRESENT ILLNESS:  The patient is a 65 year old white male found to have an elevated PSA of 5.8 ng/mL. Further evaluation was performed and exosome IntelliScore was elevated at 43.79.  Prostate MRI scan revealed a 29.8 mL prostate with a PI-RAD  category 4 lesions of the left anterior transitional zone and a PI-RAD category 3 lesion of the left posterior peripheral zone.  The patient comes in now for UroNav fusion biopsy of the prostate.  PAST MEDICAL HISTORY: ALLERGIES:  No drug allergies.  CURRENT MEDICATIONS:  Included atenolol, zolpidem, ezetimibe, vitamin D3, simvastatin, lisinopril, allopurinol, chlorthalidone, albuterol, aspirin, prednisone, and triamcinolone ointment.  PAST SURGICAL HISTORY:  1.  Repair of injured right ankle 1980. 2.  Cardiac catheterization 2002 without evidence of obstructive arteries.  PAST AND CURRENT MEDICAL CONDITIONS: 1.  Hypertension. 2.  Hypercholesterolemia. 3.  Chronic obstructive pulmonary disease. 4.  Congestive heart failure. 5.  Gout. 6.  Chronic renal disease. 7.  Liver cirrhosis. 8.  Borderline diabetes.  REVIEW OF SYSTEMS:  The patient has chronic shortness of breath.  He has joint pain and stiffness in his legs.  He has numbness and tingling of his feet.  He has chronic insomnia.  He denied diabetes or stroke.  SOCIAL HISTORY:  The patient quit smoking in 2017, with a greater than 30-pack-year history.  He quit using alcohol heavily in 2021.  FAMILY HISTORY:  Father died at age 82 of unspecified cancer.  Mother died at age 58 of heart disease.  There is no family history of urologic disease or urologic  malignancy.  PHYSICAL EXAMINATION: VITAL SIGNS:  Height 5 feet 9 inches, weight 290 pounds, BMI 43. GENERAL:  Obese white male in no acute distress. HEENT:  Sclerae were clear.  Pupils are equally round, reactive to light and accommodation.  Extraocular movements are intact. NECK:  No palpable masses or tenderness.  No audible carotid bruits. LYMPHATIC:  No palpable cervical or inguinal adenopathy. PULMONARY:  Lungs are clear to auscultation. CARDIOVASCULAR:  Regular rhythm and rate without audible murmurs. ABDOMEN:  Soft and nontender.  No CVA tenderness. GENITOURINARY:  He was circumcised with a buried penis.  Testes were smooth, nontender, approximately 18 mL in size each. RECTAL:  30 gram smooth. Nontender prostate. NEUROMUSCULAR:  Alert and oriented x3.  IMPRESSION:  1.  Elevated PSA. 2.  Abnormal prostate gland MRI scan.  PLAN:  UroNav fusion biopsy of the prostate.   PUS D: 03/23/2021 12:39:00 pm T: 03/23/2021 12:55:00 pm  JOB: [id number]

## 2021-03-24 NOTE — Patient Instructions (Signed)
Your procedure is scheduled on:04-01-21 Thursday Report to the Registration Desk on the 1st floor of the [facility]-Then proceed to the 2nd floor Surgery Desk in the [facility] To find out your arrival time, please call [phone] between 1PM - 3PM on:03-31-21 Wednesday  REMEMBER: Instructions that are not followed completely may result in serious medical risk, up to and including death; or upon the discretion of your surgeon and anesthesiologist your surgery may need to be rescheduled.  Do not eat food after midnight the night before surgery.  No gum chewing, lozengers or hard candies.  You may however, drink CLEAR liquids up to 2 hours before you are scheduled to arrive for your surgery. Do not drink anything within 2 hours of your scheduled arrival time.  Clear liquids include: - water  - apple juice without pulp - gatorade (not RED, PURPLE, OR BLUE) - black coffee or tea (Do NOT add milk or creamers to the coffee or tea) Do NOT drink anything that is not on this list.  TAKE THESE MEDICATIONS THE MORNING OF SURGERY WITH A SIP OF WATER: -Allopurinol (Zyloprim) -Atenolol (Tenormin) -Ezetimibe (Zetia) -Simvastatin (Zocor)  Use your Albuterol Inhaler the day of surgery and bring your Albuterol inhaler to the hospital  Stop your 81 mg Aspirin 7 days prior to surgery as instructed by Dr Andrey Cota dose today 03-24-21 Wednesday  One week prior to surgery: Stop Anti-inflammatories (NSAIDS) such as Advil, Aleve, Ibuprofen, Motrin, Naproxen, Naprosyn and Aspirin based products such as Excedrin, Goodys Powder, BC Powder.You may however, continue to take Tylenol if needed for pain up until the day of surgery.  Stop ANY OVER THE COUNTER supplements/vitamins NOW 03-24-21 until after surgery (Vitamin D3)  No Alcohol for 24 hours before or after surgery.  No Smoking including e-cigarettes for 24 hours prior to surgery.  No chewable tobacco products for at least 6 hours prior to surgery.   No nicotine patches on the day of surgery.  Do not use any "recreational" drugs for at least a week prior to your surgery.  Please be advised that the combination of cocaine and anesthesia may have negative outcomes, up to and including death. If you test positive for cocaine, your surgery will be cancelled.  On the morning of surgery brush your teeth with toothpaste and water, you may rinse your mouth with mouthwash if you wish. Do not swallow any toothpaste or mouthwash.  Do not wear jewelry, make-up, hairpins, clips or nail polish.  Do not wear lotions, powders, or perfumes.   Do not shave body from the neck down 48 hours prior to surgery just in case you cut yourself which could leave a site for infection.  Also, freshly shaved skin may become irritated if using the CHG soap.  Contact lenses, hearing aids and dentures may not be worn into surgery.  Do not bring valuables to the hospital. [facility] is not responsible for any missing/lost belongings or valuables.   Fleets enema as directed-Do Fleet Enema at home the morning of surgery 1 hour prior to your arrival time to hospital  Bring your C-PAP to the hospital with you   Notify your doctor if there is any change in your medical condition (cold, fever, infection).  Wear comfortable clothing (specific to your surgery type) to the hospital.  After surgery, you can help prevent lung complications by doing breathing exercises.  Take deep breaths and cough every 1-2 hours. Your doctor may order a device called an Chiropodist to  help you take deep breaths. When coughing or sneezing, hold a pillow firmly against your incision with both hands. This is called "splinting." Doing this helps protect your incision. It also decreases belly discomfort.  If you are being admitted to the hospital overnight, leave your suitcase in the car. After surgery it may be brought to your room.  If you are being discharged the day of surgery,  you will not be allowed to drive home. You will need a responsible adult (18 years or older) to drive you home and stay with you that night.   If you are taking public transportation, you will need to have a responsible adult (18 years or older) with you. Please confirm with your physician that it is acceptable to use public transportation.   Please call the [place] Dept. at [phone] if you have any questions about these instructions.  Surgery Visitation Policy:  Patients undergoing a surgery or procedure may have one family member or support person with them as long as that person is not COVID-19 positive or experiencing its symptoms.  That person may remain in the waiting area during the procedure.  Inpatient Visitation:    Visiting hours are 7 a.m. to 8 p.m. Inpatients will be allowed two visitors daily. The visitors may change each day during the patient's stay. No visitors under the age of 52. Any visitor under the age of 63 must be accompanied by an adult. The visitor must pass COVID-19 screenings, use hand sanitizer when entering and exiting the patient's room and wear a mask at all times, including in the patient's room. Patients must also wear a mask when staff or their visitor are in the room. Masking is required regardless of vaccination status.

## 2021-03-25 ENCOUNTER — Encounter
Admission: RE | Admit: 2021-03-25 | Discharge: 2021-03-25 | Disposition: A | Discharge status: Home / Self Care | Payer: Medicare PPO | Source: Ambulatory Visit | Attending: Urology | Admitting: Urology

## 2021-03-25 DIAGNOSIS — I440 Atrioventricular block, first degree: Secondary | ICD-10-CM | POA: Insufficient documentation

## 2021-03-25 DIAGNOSIS — Z01818 Encounter for other preprocedural examination: Secondary | ICD-10-CM | POA: Diagnosis present

## 2021-03-25 DIAGNOSIS — I509 Heart failure, unspecified: Secondary | ICD-10-CM | POA: Diagnosis not present

## 2021-03-25 DIAGNOSIS — I110 Hypertensive heart disease with heart failure: Secondary | ICD-10-CM | POA: Diagnosis not present

## 2021-03-25 LAB — CBC
HCT: 41.9 % (ref 39.0–52.0)
Hemoglobin: 13.7 g/dL (ref 13.0–17.0)
MCH: 34.6 pg — ABNORMAL HIGH (ref 26.0–34.0)
MCHC: 32.7 g/dL (ref 30.0–36.0)
MCV: 105.8 fL — ABNORMAL HIGH (ref 80.0–100.0)
Platelets: 184 10*3/uL (ref 150–400)
RBC: 3.96 MIL/uL — ABNORMAL LOW (ref 4.22–5.81)
RDW: 14.3 % (ref 11.5–15.5)
WBC: 6.5 10*3/uL (ref 4.0–10.5)
nRBC: 0 % (ref 0.0–0.2)

## 2021-03-25 LAB — COMPREHENSIVE METABOLIC PANEL
ALT: 14 U/L (ref 0–44)
AST: 16 U/L (ref 15–41)
Albumin: 3.7 g/dL (ref 3.5–5.0)
Alkaline Phosphatase: 38 U/L (ref 38–126)
Anion gap: 7 (ref 5–15)
BUN: 45 mg/dL — ABNORMAL HIGH (ref 8–23)
CO2: 26 mmol/L (ref 22–32)
Calcium: 9.8 mg/dL (ref 8.9–10.3)
Chloride: 111 mmol/L (ref 98–111)
Creatinine, Ser: 2.01 mg/dL — ABNORMAL HIGH (ref 0.61–1.24)
GFR, Estimated: 37 mL/min — ABNORMAL LOW (ref >60)
Glucose, Bld: 95 mg/dL (ref 70–99)
Potassium: 4.4 mmol/L (ref 3.5–5.1)
Sodium: 144 mmol/L (ref 135–145)
Total Bilirubin: 0.7 mg/dL (ref 0.3–1.2)
Total Protein: 7 g/dL (ref 6.5–8.1)

## 2021-04-01 ENCOUNTER — Ambulatory Visit: Payer: Medicare PPO | Admitting: Urgent Care

## 2021-04-01 ENCOUNTER — Encounter
Admission: RE | Disposition: A | Discharge status: Home / Self Care | Payer: Self-pay | Source: Home / Self Care | Attending: Urology

## 2021-04-01 ENCOUNTER — Ambulatory Visit
Admission: RE | Admit: 2021-04-01 | Discharge: 2021-04-01 | Disposition: A | Discharge status: Home / Self Care | Payer: Medicare PPO | Attending: Urology | Admitting: Urology

## 2021-04-01 ENCOUNTER — Other Ambulatory Visit: Payer: Self-pay

## 2021-04-01 ENCOUNTER — Encounter: Payer: Self-pay | Admitting: Urology

## 2021-04-01 DIAGNOSIS — Z809 Family history of malignant neoplasm, unspecified: Secondary | ICD-10-CM | POA: Insufficient documentation

## 2021-04-01 DIAGNOSIS — Z6841 Body Mass Index (BMI) 40.0 and over, adult: Secondary | ICD-10-CM | POA: Diagnosis not present

## 2021-04-01 DIAGNOSIS — Z7982 Long term (current) use of aspirin: Secondary | ICD-10-CM | POA: Insufficient documentation

## 2021-04-01 DIAGNOSIS — E1122 Type 2 diabetes mellitus with diabetic chronic kidney disease: Secondary | ICD-10-CM | POA: Diagnosis not present

## 2021-04-01 DIAGNOSIS — E7800 Pure hypercholesterolemia, unspecified: Secondary | ICD-10-CM | POA: Insufficient documentation

## 2021-04-01 DIAGNOSIS — C61 Malignant neoplasm of prostate: Secondary | ICD-10-CM | POA: Diagnosis not present

## 2021-04-01 DIAGNOSIS — Z8249 Family history of ischemic heart disease and other diseases of the circulatory system: Secondary | ICD-10-CM | POA: Insufficient documentation

## 2021-04-01 DIAGNOSIS — R972 Elevated prostate specific antigen [PSA]: Secondary | ICD-10-CM | POA: Diagnosis present

## 2021-04-01 DIAGNOSIS — K7460 Unspecified cirrhosis of liver: Secondary | ICD-10-CM | POA: Insufficient documentation

## 2021-04-01 DIAGNOSIS — N189 Chronic kidney disease, unspecified: Secondary | ICD-10-CM | POA: Insufficient documentation

## 2021-04-01 DIAGNOSIS — I2510 Atherosclerotic heart disease of native coronary artery without angina pectoris: Secondary | ICD-10-CM | POA: Insufficient documentation

## 2021-04-01 DIAGNOSIS — Z7952 Long term (current) use of systemic steroids: Secondary | ICD-10-CM | POA: Insufficient documentation

## 2021-04-01 DIAGNOSIS — Z87891 Personal history of nicotine dependence: Secondary | ICD-10-CM | POA: Insufficient documentation

## 2021-04-01 DIAGNOSIS — I509 Heart failure, unspecified: Secondary | ICD-10-CM | POA: Diagnosis not present

## 2021-04-01 DIAGNOSIS — I130 Hypertensive heart and chronic kidney disease with heart failure and stage 1 through stage 4 chronic kidney disease, or unspecified chronic kidney disease: Secondary | ICD-10-CM | POA: Insufficient documentation

## 2021-04-01 DIAGNOSIS — Z79899 Other long term (current) drug therapy: Secondary | ICD-10-CM | POA: Diagnosis not present

## 2021-04-01 DIAGNOSIS — J449 Chronic obstructive pulmonary disease, unspecified: Secondary | ICD-10-CM | POA: Diagnosis not present

## 2021-04-01 DIAGNOSIS — M109 Gout, unspecified: Secondary | ICD-10-CM | POA: Insufficient documentation

## 2021-04-01 HISTORY — PX: PROSTATE BIOPSY: SHX241

## 2021-04-01 LAB — GLUCOSE, CAPILLARY: Glucose-Capillary: 96 mg/dL (ref 70–99)

## 2021-04-01 SURGERY — BIOPSY, PROSTATE
Anesthesia: General

## 2021-04-01 MED ORDER — CHLORHEXIDINE GLUCONATE 0.12 % MT SOLN
OROMUCOSAL | Status: AC
  Administered 2021-04-01: 15 mL via OROMUCOSAL
  Filled 2021-04-01: qty 15

## 2021-04-01 MED ORDER — OXYCODONE HCL 5 MG PO TABS: 5.0000 mg | ORAL_TABLET | Freq: Once | ORAL | Status: DC | PRN

## 2021-04-01 MED ORDER — CEFAZOLIN SODIUM-DEXTROSE 1-4 GM/50ML-% IV SOLN
1.0000 g | Freq: Once | INTRAVENOUS | Status: AC
  Administered 2021-04-01: 1 g via INTRAVENOUS

## 2021-04-01 MED ORDER — HYDROMORPHONE HCL 1 MG/ML IJ SOLN: 0.2500 mg | INTRAMUSCULAR | Status: DC | PRN

## 2021-04-01 MED ORDER — MIDAZOLAM HCL 2 MG/2ML IJ SOLN
INTRAMUSCULAR | Status: AC
  Filled 2021-04-01: qty 2

## 2021-04-01 MED ORDER — PHENYLEPHRINE HCL (PRESSORS) 10 MG/ML IV SOLN
INTRAVENOUS | Status: DC | PRN
  Administered 2021-04-01: 200 ug via INTRAVENOUS
  Administered 2021-04-01 (×3): 100 ug via INTRAVENOUS

## 2021-04-01 MED ORDER — PROMETHAZINE HCL 25 MG/ML IJ SOLN: 12.5000 mg | Freq: Once | INTRAMUSCULAR | Status: DC | PRN

## 2021-04-01 MED ORDER — GENTAMICIN IN SALINE 1.6-0.9 MG/ML-% IV SOLN
80.0000 mg | INTRAVENOUS | Status: AC
  Administered 2021-04-01: 80 mg via INTRAVENOUS
  Filled 2021-04-01: qty 50

## 2021-04-01 MED ORDER — EPHEDRINE SULFATE 50 MG/ML IJ SOLN
INTRAMUSCULAR | Status: DC | PRN
  Administered 2021-04-01: 7.5 mg via INTRAVENOUS

## 2021-04-01 MED ORDER — ACETAMINOPHEN 10 MG/ML IV SOLN: 1000.0000 mg | Freq: Once | INTRAVENOUS | Status: DC | PRN

## 2021-04-01 MED ORDER — CEFAZOLIN SODIUM-DEXTROSE 1-4 GM/50ML-% IV SOLN
INTRAVENOUS | Status: AC
  Filled 2021-04-01: qty 50

## 2021-04-01 MED ORDER — OXYCODONE HCL 5 MG/5ML PO SOLN: 5.0000 mg | Freq: Once | ORAL | Status: DC | PRN

## 2021-04-01 MED ORDER — FAMOTIDINE 20 MG PO TABS
ORAL_TABLET | ORAL | Status: AC
  Filled 2021-04-01: qty 1

## 2021-04-01 MED ORDER — CHLORHEXIDINE GLUCONATE 0.12 % MT SOLN: 15.0000 mL | Freq: Once | OROMUCOSAL | Status: AC

## 2021-04-01 MED ORDER — ORAL CARE MOUTH RINSE: 15.0000 mL | Freq: Once | OROMUCOSAL | Status: AC

## 2021-04-01 MED ORDER — ACETAMINOPHEN 325 MG PO TABS: 325.0000 mg | ORAL_TABLET | ORAL | Status: DC | PRN

## 2021-04-01 MED ORDER — GENTAMICIN SULFATE 40 MG/ML IJ SOLN
80.0000 mg | Freq: Once | INTRAVENOUS | Status: DC
  Filled 2021-04-01: qty 2

## 2021-04-01 MED ORDER — FENTANYL CITRATE (PF) 100 MCG/2ML IJ SOLN
INTRAMUSCULAR | Status: AC
  Filled 2021-04-01: qty 2

## 2021-04-01 MED ORDER — ONDANSETRON HCL 4 MG/2ML IJ SOLN
INTRAMUSCULAR | Status: DC | PRN
  Administered 2021-04-01: 4 mg via INTRAVENOUS

## 2021-04-01 MED ORDER — ACETAMINOPHEN 160 MG/5ML PO SOLN
325.0000 mg | ORAL | Status: DC | PRN
  Filled 2021-04-01: qty 20.3

## 2021-04-01 MED ORDER — MIDAZOLAM HCL 2 MG/2ML IJ SOLN
INTRAMUSCULAR | Status: DC | PRN
  Administered 2021-04-01: 2 mg via INTRAVENOUS

## 2021-04-01 MED ORDER — FAMOTIDINE 20 MG PO TABS
20.0000 mg | ORAL_TABLET | Freq: Once | ORAL | Status: AC
  Administered 2021-04-01: 20 mg via ORAL

## 2021-04-01 MED ORDER — FENTANYL CITRATE (PF) 100 MCG/2ML IJ SOLN
INTRAMUSCULAR | Status: DC | PRN
  Administered 2021-04-01: 50 ug via INTRAVENOUS

## 2021-04-01 MED ORDER — PROPOFOL 10 MG/ML IV BOLUS
INTRAVENOUS | Status: AC
  Filled 2021-04-01: qty 20

## 2021-04-01 MED ORDER — FLEET ENEMA 7-19 GM/118ML RE ENEM
1.0000 | ENEMA | Freq: Once | RECTAL | Status: AC
  Administered 2021-04-01: 1 via RECTAL

## 2021-04-01 MED ORDER — LACTATED RINGERS IV SOLN: INTRAVENOUS | Status: DC

## 2021-04-01 MED ORDER — SUCCINYLCHOLINE CHLORIDE 200 MG/10ML IV SOSY
PREFILLED_SYRINGE | INTRAVENOUS | Status: DC | PRN
  Administered 2021-04-01: 140 mg via INTRAVENOUS

## 2021-04-01 MED ORDER — SODIUM CHLORIDE 0.9 % IV SOLN
INTRAVENOUS | Status: DC | PRN
  Administered 2021-04-01: 45 ug/min via INTRAVENOUS

## 2021-04-01 MED ORDER — LEVOFLOXACIN 500 MG PO TABS: 500.0000 mg | ORAL_TABLET | Freq: Every day | ORAL | 1 refills | Status: DC

## 2021-04-01 MED ORDER — DEXAMETHASONE SODIUM PHOSPHATE 10 MG/ML IJ SOLN
INTRAMUSCULAR | Status: DC | PRN
  Administered 2021-04-01: 5 mg via INTRAVENOUS

## 2021-04-01 MED ORDER — PHENYLEPHRINE HCL (PRESSORS) 10 MG/ML IV SOLN
INTRAVENOUS | Status: AC
  Filled 2021-04-01: qty 1

## 2021-04-01 SURGICAL SUPPLY — 25 items
COVER MAYO STAND REUSABLE (DRAPES) ×2 IMPLANT
COVER TRANSDUCER ULTRASOUND (MISCELLANEOUS) ×1 IMPLANT
FEE DELIVERY LASER CO2 FORTEC (MISCELLANEOUS) IMPLANT
GLOVE SURG ENC MOIS LTX SZ7 (GLOVE) ×4 IMPLANT
GUIDE NDL ENDOCAV 16-18 CVR (NEEDLE) IMPLANT
GUIDE NDL URONAV ULTRASND S (MISCELLANEOUS) IMPLANT
GUIDE NEEDLE ENDOCAV 16-18 CVR (NEEDLE) IMPLANT
GUIDE NEEDLE URONAV ULTRASND S (MISCELLANEOUS) ×1 IMPLANT
INST BIOPSY MAXCORE 18GX25 (NEEDLE) ×2 IMPLANT
LASER CO2 FORTEC DELIVERY FEE (MISCELLANEOUS) ×2 IMPLANT
LASER XPS ACCESS DROP OFF FEE (MISCELLANEOUS) ×1 IMPLANT
MANIFOLD NEPTUNE II (INSTRUMENTS) ×1 IMPLANT
NDL GUIDE BIOPSY 644068 (NEEDLE) IMPLANT
NEEDLE GUIDE BIOPSY 644068 (NEEDLE) IMPLANT
NS IRRIG 1000ML POUR BTL (IV SOLUTION) ×1 IMPLANT
PROBE BIOSP ALOKA ALPHA6 PROST (MISCELLANEOUS) ×1 IMPLANT
PROBE URONAV BK 8808E 8818 HLD (MISCELLANEOUS) IMPLANT
STRAP SAFETY 5IN WIDE (MISCELLANEOUS) ×2 IMPLANT
SURGILUBE 2OZ TUBE FLIPTOP (MISCELLANEOUS) ×2 IMPLANT
SYR TOOMEY 50ML (SYRINGE) ×1 IMPLANT
TOWEL OR 17X26 4PK STRL BLUE (TOWEL DISPOSABLE) ×2 IMPLANT
URONAV BK 8808E 8818 PROBE HLD (MISCELLANEOUS) ×2
URONAV MRI FUSION TWO PATIENTS (MISCELLANEOUS) ×1 IMPLANT
URONAV ULTRASOUND (MISCELLANEOUS) ×1 IMPLANT
URONAV ULTRASOUND NDL GUIDE S (MISCELLANEOUS) ×2

## 2021-04-01 NOTE — H&P (Signed)
Date of Initial H&P: 03/23/21  History reviewed, patient examined, no change in status, stable for surgery.

## 2021-04-01 NOTE — Anesthesia Preprocedure Evaluation (Addendum)
Anesthesia Evaluation  Patient identified by MRN, date of birth, ID band Patient awake    Reviewed: Allergy & Precautions, NPO status , Patient's Chart, lab work & pertinent test results, reviewed documented beta blocker date and time   History of Anesthesia Complications (+) history of anesthetic complications ("heart issue" during colonoscopy - per patient he saw a cardiologist and nothing abnormal was found)  Airway Mallampati: II  TM Distance: >3 FB Neck ROM: Limited   Comment: Large neck, beard Dental   Poor dentition - decayed, patient denies loose:   Pulmonary sleep apnea and Continuous Positive Airway Pressure Ventilation , COPD (prn inhalers), former smoker,    Pulmonary exam normal        Cardiovascular hypertension, + CAD and +CHF  Normal cardiovascular exam  Reports a heart cath around 2002 for an episode of heart failure - no blockages.  CHF has not returned.  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)      ,   Endo/Other  diabetesMorbid obesity  Renal/GU CRFRenal disease  negative genitourinary   Musculoskeletal negative musculoskeletal ROS (+)   Abdominal   Peds  Hematology negative hematology ROS (+)   Anesthesia Other Findings EKG 03/25/21 Sinus bradycardia with 1st degree A-V block with Premature atrial complexes  Reproductive/Obstetrics                           Anesthesia Physical Anesthesia Plan  ASA: 3  Anesthesia Plan: General   Post-op Pain Management:    Induction:   PONV Risk Score and Plan: 2  Airway Management Planned: Oral ETT  Additional Equipment:   Intra-op Plan:   Post-operative Plan:   Informed Consent: I have reviewed the patients History and Physical, chart, labs and discussed the procedure including the risks, benefits and alternatives for the proposed anesthesia  with the patient or authorized representative who has indicated his/her understanding and acceptance.       Plan Discussed with: Anesthesiologist  Anesthesia Plan Comments: (Have McGrath available)       Anesthesia Quick Evaluation

## 2021-04-01 NOTE — Discharge Instructions (Addendum)
Transrectal Ultrasound-Guided Prostate Biopsy, Care After This sheet gives you information about how to care for yourself after your procedure. Your doctor may also give you more specific instructions. If youhave problems or questions, contact your doctor. What can I expect after the procedure? After the procedure, it is common to have: Pain and discomfort in your butt, especially while sitting. Pink-colored pee (urine), due to small amounts of blood in the pee. Burning while peeing (urinating). Blood in your poop (stool). Bleeding from your butt. Blood in your semen. Follow these instructions at home: Medicines Take over-the-counter and prescription medicines only as told by your doctor. If you were prescribed antibiotic medicine, take it as told by your doctor. Do not stop taking the antibiotic even if you start to feel better. Activity  Do not drive for 24 hours if you were given a medicine to help you relax (sedative) during your procedure. Return to your normal activities as told by your doctor. Ask your doctor what activities are safe for you. Ask your doctor when it is okay for you to have sex. Do not lift anything that is heavier than 10 lb (4.5 kg), or the limit that you are told, until your doctor says that it is safe.   General instructions  Drink enough water to keep your pee pale yellow. Watch your pee, poop, and semen for new bleeding or bleeding that gets worse. Keep all follow-up visits as told by your doctor. This is important.   Contact a doctor if you: Have blood clots in your pee or poop. Notice that your pee smells bad or unusual. Have very bad belly pain. Have trouble peeing. Notice that your lower belly feels firm. Have blood in your pee for more than 2 weeks after the procedure. Have blood in your semen for more than 2 months after the procedure. Have problems getting an erection. Feel sick to your stomach (nauseous) or throw up (vomit). Have new or worse  bleeding in your pee, poop, or semen. Get help right away if you: Have a fever or chills. Have bright red pee. Have very bad pain that does not get better with medicine. Cannot pee. Summary After this procedure, it is common to have pain and discomfort around your butt, especially while sitting. You may have blood in your pee and poop. It is common to have blood in your semen for 1-2 months. If you were prescribed antibiotic medicine, take it as told by your doctor. Do not stop taking the antibiotic even if you start to feel better. Get help right away if you have a fever or chills. This information is not intended to replace advice given to you by your health care provider. Make sure you discuss any questions you have with your healthcare provider. Document Revised: 06/29/2020 Document Reviewed: 04/30/2020 Elsevier Patient Education  2022 [place]   The drugs that you were given will stay in your system until tomorrow so for the next 24 hours you should not:  Drive an automobile Make any legal decisions Drink any alcoholic beverage   You may resume regular meals tomorrow.  Today it is better to start with liquids and gradually work up to solid foods.  You may eat anything you prefer, but it is better to start with liquids, then soup and crackers, and gradually work up to solid foods.   Please notify your doctor immediately if you have any unusual bleeding, trouble breathing, redness and  pain at the surgery site, drainage, fever, or pain not relieved by medication.    Additional Instructions:        Please contact your physician with any problems or Same Day Surgery at [phone], Monday through Friday 6 am to 4 pm, or  at [facility] number at [phone].

## 2021-04-01 NOTE — Op Note (Signed)
Preoperative diagnosis: 1.  Elevated PSA (R97.2)                                           2.  Abnormal prostate gland MRI scan (D40.0)  Postoperative diagnosis: Same  Procedure: Uronav fusion transrectal ultrasound-guided biopsy of the prostate ([facility] [place], [phone])  Surgeon: Otelia Limes. Yves Dill MD  Anesthesia: General  Indications:See the history and physical. After informed consent the above procedure(s) were requested     Technique and findings: After adequate general anesthesia been obtained the patient was placed into the left lateral decubitus position.  Finger sweep of the rectal vault revealed some fecal material.  Therefore a saline enema was performed until clear.  At this point the ultrasound probe was placed and images of the prostate gland acquired.  The ultrasound images were then fused with the MRI images.  Region of interest #1 in the left anterior position zone was identified and 3 core biopsies obtained.  Region of interest #2 in the left posterior peripheral zone was identified and 3 core biopsies obtained in this location.  At this point standard 12 core systematic biopsies were obtained.  The ultrasound probe was removed.  Blood loss was minimal.  Procedure was then terminated and patient transferred to the recovery room in stable condition.

## 2021-04-01 NOTE — Anesthesia Postprocedure Evaluation (Signed)
Anesthesia Post Note  Patient: Kevin Gross  Procedure(s) Performed: PROSTATE BIOPSY URONAV  Patient location during evaluation: PACU Anesthesia Type: General Level of consciousness: awake and alert Pain management: pain level controlled Vital Signs Assessment: post-procedure vital signs reviewed and stable Respiratory status: spontaneous breathing, nonlabored ventilation, respiratory function stable and patient connected to nasal cannula oxygen Cardiovascular status: blood pressure returned to baseline and stable Postop Assessment: no apparent nausea or vomiting Anesthetic complications: no   No notable events documented.   Last Vitals:  Vitals:   04/01/21 0910 04/01/21 0911  BP: 111/64 (!) 120/57  Pulse: 76 74  Resp: (!) 22 14  Temp:  (!) 36.1 C  SpO2: 95% 95%    Last Pain:  Vitals:   04/01/21 0911  TempSrc: Temporal  PainSc: 0-No pain                 [place]

## 2021-04-01 NOTE — Anesthesia Procedure Notes (Signed)
Procedure Name: Intubation Date/Time: 04/01/2021 7:50 AM Performed by: Fredderick Phenix, CRNA Pre-anesthesia Checklist: Patient identified, Emergency Drugs available, Suction available and Patient being monitored Patient Re-evaluated:Patient Re-evaluated prior to induction Oxygen Delivery Method: Circle system utilized Preoxygenation: Pre-oxygenation with 100% oxygen Induction Type: IV induction Ventilation: Mask ventilation without difficulty Laryngoscope Size: McGraph and 4 Grade View: Grade II Tube type: Oral Tube size: 7.5 mm Number of attempts: 2 (DL with first attempt.) Airway Equipment and Method: Stylet and Oral airway Placement Confirmation: ETT inserted through vocal cords under direct vision, positive ETCO2 and breath sounds checked- equal and bilateral Secured at: 2 cm Tube secured with: Tape Dental Injury: Teeth and Oropharynx as per pre-operative assessment

## 2021-04-01 NOTE — Transfer of Care (Signed)
Immediate Anesthesia Transfer of Care Note  Patient: Kevin Gross  Procedure(s) Performed: PROSTATE BIOPSY URONAV  Patient Location: PACU  Anesthesia Type:General  Level of Consciousness: awake and alert   Airway & Oxygen Therapy: Patient Spontanous Breathing and Patient connected to face mask oxygen  Post-op Assessment: Report given to RN and Post -op Vital signs reviewed and stable  Post vital signs: Reviewed and stable  Last Vitals:  Vitals Value Taken Time  BP 140/67 04/01/21 0843  Temp    Pulse 69 04/01/21 0845  Resp 17 04/01/21 0845  SpO2 93 % 04/01/21 0845  Vitals shown include unvalidated device data.  Last Pain:  Vitals:   04/01/21 0640  TempSrc: Temporal  PainSc: 0-No pain      Patients Stated Pain Goal: 0 ([id number])  Complications: No notable events documented.

## 2021-04-05 LAB — SURGICAL PATHOLOGY

## 2021-06-15 ENCOUNTER — Other Ambulatory Visit: Payer: Self-pay | Admitting: Radiology

## 2021-06-15 DIAGNOSIS — C61 Malignant neoplasm of prostate: Secondary | ICD-10-CM

## 2021-06-30 ENCOUNTER — Ambulatory Visit: Payer: Medicare PPO

## 2021-07-23 ENCOUNTER — Other Ambulatory Visit: Payer: Self-pay

## 2021-07-23 ENCOUNTER — Ambulatory Visit
Admission: RE | Admit: 2021-07-23 | Discharge: 2021-07-23 | Disposition: A | Discharge status: Home / Self Care | Payer: Medicare PPO | Source: Ambulatory Visit | Attending: Radiology | Admitting: Radiology

## 2021-07-23 DIAGNOSIS — C61 Malignant neoplasm of prostate: Secondary | ICD-10-CM | POA: Insufficient documentation

## 2021-08-16 ENCOUNTER — Encounter: Payer: Self-pay | Admitting: Cardiovascular Disease

## 2021-08-16 ENCOUNTER — Ambulatory Visit: Payer: Medicare PPO | Admitting: Cardiovascular Disease

## 2021-08-16 ENCOUNTER — Other Ambulatory Visit: Payer: Self-pay

## 2021-08-16 VITALS — BP 130/80 | HR 53 | Ht 70.0 in | Wt 295.0 lb

## 2021-08-16 DIAGNOSIS — J432 Centrilobular emphysema: Secondary | ICD-10-CM

## 2021-08-16 DIAGNOSIS — I25118 Atherosclerotic heart disease of native coronary artery with other forms of angina pectoris: Secondary | ICD-10-CM | POA: Diagnosis not present

## 2021-08-16 DIAGNOSIS — I700 Atherosclerosis of aorta: Secondary | ICD-10-CM | POA: Diagnosis not present

## 2021-08-16 DIAGNOSIS — N183 Chronic kidney disease, stage 3 unspecified: Secondary | ICD-10-CM

## 2021-08-16 DIAGNOSIS — E782 Mixed hyperlipidemia: Secondary | ICD-10-CM

## 2021-08-16 DIAGNOSIS — I739 Peripheral vascular disease, unspecified: Secondary | ICD-10-CM

## 2021-08-16 NOTE — Progress Notes (Signed)
Cardiology Office Note  Date:  08/16/2021   ID:  Jeremi, Losito 04/15/1956, MRN [id number]  PCP:  Rutherford Limerick, PA   Chief Complaint  Patient presents with   New Patient (Initial Visit)    Referred by August Luz, PA for consultation of his CAD. "Doing well." Medications reviewed by the patient verbally.     HPI:  Mr. Tylen Leverich is a 65 year old male with past medical history of COPD, former smoker quit 2016 (smoked from 89 yrs) Near Syncope 05/2017, seen in the ER, elevated CR 1.6 Anxiety Hyperlipidemia Hepatic cirrhosis Severe b/l PAD, embolic thrombus to lower extremity early 2000 Dependant edema CAD Obesity Prostate cancer, seeds in place Who presents by referral from August Luz for consultation of his CAD  Previously seen in clinic over 3 years ago, August 2019  Seen in the emergency room February 2021 for chest pain Records reviewed Blood pressure elevated at that time 178/98  CT scan chest August 2021  images pulled up and reviewed Mild coronary calcium  CT scan pelvis November 2022  images pulled up and reviewed Severe PAD, b/l  Chronic mild SOB, better than before Uses proair, rare No angina  CPA at night for OSA  Lab work reviewed Prior labs: Creatinine 1.7 BUN 33, On chlorthalidone  EKG personally reviewed by myself on todays visit Shows normal sinus rhythm with rate 68 bpm APCs no significant ST-T wave changes  PMH:   has a past medical history of Alcoholic cirrhosis of liver ([place]), CHF (congestive heart failure) ([place]), Clotting disorder ([place]), Complication of anesthesia, COPD (chronic obstructive pulmonary disease) ([place]), Hyperlipidemia, Hypertension, Pre-diabetes, and Sleep apnea.  PSH:    Past Surgical History:  Procedure Laterality Date   CARDIAC CATHETERIZATION     [facility]   COLONOSCOPY     COLONOSCOPY WITH PROPOFOL N/A 01/17/2018   Procedure: COLONOSCOPY WITH PROPOFOL;  Surgeon: Toledo, Benay Pike, MD;  Location: [facility]  ENDOSCOPY;  Service: Gastroenterology;  Laterality: N/A;   ESOPHAGOGASTRODUODENOSCOPY (EGD) WITH PROPOFOL N/A 01/17/2018   Procedure: ESOPHAGOGASTRODUODENOSCOPY (EGD) WITH PROPOFOL;  Surgeon: Toledo, Benay Pike, MD;  Location: [facility] ENDOSCOPY;  Service: Gastroenterology;  Laterality: N/A;   ORIF ANKLE FRACTURE Right    PROSTATE BIOPSY N/A 04/01/2021   Procedure: PROSTATE BIOPSY Vernelle Emerald;  Surgeon: Royston Cowper, MD;  Location: [facility] ORS;  Service: Urology;  Laterality: N/A;    Current Outpatient Medications  Medication Sig Dispense Refill   albuterol (PROVENTIL HFA;VENTOLIN HFA) 108 (90 Base) MCG/ACT inhaler Inhale 1-2 puffs into the lungs every 6 (six) hours as needed for wheezing or shortness of breath.     allopurinol (ZYLOPRIM) 300 MG tablet Take 300 mg by mouth every morning.     aspirin EC 81 MG tablet Take 81 mg by mouth every morning.     atenolol (TENORMIN) 25 MG tablet Take 25 mg by mouth every morning.     chlorthalidone (HYGROTON) 25 MG tablet Take 25 mg by mouth every morning.     clobetasol cream (TEMOVATE) 3.24 % Apply 1 application topically 2 (two) times daily as needed (irritation).     ezetimibe (ZETIA) 10 MG tablet Take 1 tablet (10 mg total) by mouth daily. (Patient taking differently: Take 10 mg by mouth every morning.) 90 tablet 3   lisinopril (ZESTRIL) 10 MG tablet Take 10 mg by mouth every morning.     simvastatin (ZOCOR) 20 MG tablet Take 20 mg by mouth every morning.     VITAMIN D-1000 MAX ST 25  MCG (1000 UT) tablet Take 2,000 Units by mouth daily.     zolpidem (AMBIEN) 10 MG tablet Take 10 mg by mouth at bedtime.     acetaminophen (TYLENOL) 325 MG tablet Take 2 tablets (650 mg total) by mouth every 6 (six) hours as needed for mild pain (or Fever >/= 101). (Patient not taking: Reported on 03/19/2021) 30 tablet 0   colchicine 0.6 MG tablet Take 1 tablet (0.6 mg total) by mouth 2 (two) times daily for 30 days. (Patient not taking: Reported on 03/24/2021) 60 tablet 0    HYDROcodone-acetaminophen (NORCO/VICODIN) 5-325 MG tablet Take 1 tablet by mouth every 6 (six) hours as needed for moderate pain or severe pain. (Patient not taking: Reported on 03/19/2021) 15 tablet 0   levofloxacin (LEVAQUIN) 500 MG tablet Take 1 tablet (500 mg total) by mouth daily. (Patient not taking: Reported on 08/16/2021) 7 tablet 1   polyethylene glycol (MIRALAX / GLYCOLAX) 17 g packet Take 17 g by mouth daily as needed for mild constipation. (Patient not taking: Reported on 03/19/2021) 14 each 0   predniSONE (STERAPRED UNI-PAK 21 TAB) 10 MG (21) TBPK tablet Take 6 tabs first day, 5 tab on day 2, then 4 on day 3rd, 3 tabs on day 4th , 2 tab on day 5th, and 1 tab on 6th day. (Patient not taking: Reported on 03/19/2021) 21 tablet 0   simvastatin (ZOCOR) 10 MG tablet Take 1 tablet (10 mg total) by mouth daily. (Patient not taking: Reported on 03/19/2021) 30 tablet 0   No current facility-administered medications for this visit.     Allergies:   Patient has no known allergies.   Social History:  The patient  reports that he quit smoking about 6 years ago. His smoking use included cigarettes. He has a 47.00 pack-year smoking history. He has never used smokeless tobacco. He reports current alcohol use. He reports that he does not use drugs.   Family History:   family history includes Heart disease in his brother and mother.    Review of Systems: Review of Systems  Constitutional: Negative.   Respiratory:  Positive for shortness of breath.   Cardiovascular: Negative.   Gastrointestinal: Negative.   Musculoskeletal:  Positive for back pain and joint pain.  Neurological: Negative.   Psychiatric/Behavioral: Negative.    All other systems reviewed and are negative.   PHYSICAL EXAM: VS:  BP 130/80 (BP Location: Right Arm, Patient Position: Sitting, Cuff Size: Large)    Pulse (!) 53    Ht 5\' 10"  (1.778 m)    Wt 295 lb (133.8 kg)    SpO2 97%    BMI 42.33 kg/m  , BMI Body mass index is 42.33  kg/m. GEN: Well nourished, well developed, in no acute distress  HEENT: normal  Neck: no JVD, carotid bruits, or masses Cardiac: RRR; no murmurs, rubs, or gallops,no edema  Respiratory:  clear to auscultation bilaterally, normal work of breathing GI: soft, nontender, nondistended, + BS MS: no deformity or atrophy  Skin: warm and dry, no rash Neuro:  Strength and sensation are intact Psych: euthymic mood, full affect    Recent Labs: 03/25/2021: ALT 14; BUN 45; Creatinine, Ser 2.01; Hemoglobin 13.7; Platelets 184; Potassium 4.4; Sodium 144    Lipid Panel No results found for: CHOL, HDL, LDLCALC, TRIG    Wt Readings from Last 3 Encounters:  08/16/21 295 lb (133.8 kg)  04/01/21 298 lb (135.2 kg)  04/03/20 300 lb (136.1 kg)  ASSESSMENT AND PLAN:  Aortic atherosclerosis (HCC) CT scan images reviewed mild aortic atherosclerosis He quit smoking 6 years ago ,nondiabetic Severe PAD Stay on zetia and simvastatin  Coronary artery disease of native artery of native heart with stable angina pectoris ([place]) - Plan: EKG 12-Lead Denies any anginal symptoms Chronic stable shortness of breath likely from deconditioning and COPD Currently with no symptoms of angina. No further workup at this time. Continue current medication regimen.  Centrilobular emphysema (HCC) uses pro-air, symptoms are stable  CRI (chronic renal insufficiency), stage 3 (moderate) ([place]) Labs requested Has been referred to nephrology  Leg edema Chronic issue , use to wear compression hose Likely dependent edema, not secondary to CHF   Total encounter time more than 60 minutes  Greater than 50% was spent in counseling and coordination of care with the patient    No orders of the defined types were placed in this encounter.    Signed, Esmond Plants, M.D., Ph.D. 08/16/2021  [place], [place]

## 2021-08-16 NOTE — Patient Instructions (Addendum)
Medication Instructions:  No changes  If you need a refill on your cardiac medications before your next appointment, please call your pharmacy.    Lab work: Will attempt to get lipids from PCP Goal LDL <60  Testing/Procedures: Ultrasound of lower legs VAS [place] LOWER EXTREMITY ARTERIAL DUPLEX VAS US AORTA/IVC/ILIACS  Follow-Up: At [facility], you and your health needs are our priority.  As part of our continuing mission to provide you with exceptional heart care, we have created designated Provider Care Teams.  These Care Teams include your primary Cardiologist (physician) and Advanced Practice Providers (APPs -  Physician Assistants and Nurse Practitioners) who all work together to provide you with the care you need, when you need it.  You will need a follow up appointment in 12 months  Providers on your designated Care Team:   Murray Hodgkins, NP Christell Faith, PA-C Cadence Kathlen Mody, [place]  COVID-19 Vaccine Information can be found at: [email or web address] For questions related to vaccine distribution or appointments, please email [email or web address] or call [phone].

## 2021-09-16 ENCOUNTER — Other Ambulatory Visit: Payer: Self-pay | Admitting: Cardiovascular Disease

## 2021-09-16 DIAGNOSIS — I700 Atherosclerosis of aorta: Secondary | ICD-10-CM

## 2021-09-16 DIAGNOSIS — I739 Peripheral vascular disease, unspecified: Secondary | ICD-10-CM

## 2021-09-23 ENCOUNTER — Ambulatory Visit (INDEPENDENT_AMBULATORY_CARE_PROVIDER_SITE_OTHER): Payer: Medicare PPO

## 2021-09-23 ENCOUNTER — Other Ambulatory Visit: Payer: Self-pay

## 2021-09-23 ENCOUNTER — Institutional Professional Consult (permissible substitution): Payer: Medicare PPO | Admitting: Cardiovascular Disease

## 2021-09-23 DIAGNOSIS — I739 Peripheral vascular disease, unspecified: Secondary | ICD-10-CM

## 2021-09-23 DIAGNOSIS — I700 Atherosclerosis of aorta: Secondary | ICD-10-CM | POA: Diagnosis not present

## 2021-09-27 ENCOUNTER — Telehealth: Payer: Self-pay

## 2021-09-27 NOTE — Telephone Encounter (Signed)
Able to reach pt regarding his recent Vascular LE ultasound Dr. Rockey Situ had a chance to review his results and advised   "Lower extremity arterial studies indicating some stenosis bilaterally extending from iliac arteries on down  Recommendation and report for referral to be evaluated by vascular physician  Consider appointment with Dr. Myrene Galas for further evaluation "  Kevin Gross very thankful for the phone call of his results, all questions and concerns were address with nothing further at this time. Will see at next schedule f/u appt.   Dr. Fletcher Anon tomorrow 1/31 at 4:40 pm to discuss results further.

## 2021-09-28 ENCOUNTER — Other Ambulatory Visit: Payer: Self-pay

## 2021-09-28 ENCOUNTER — Ambulatory Visit: Payer: Medicare PPO | Admitting: Cardiovascular Disease

## 2021-09-28 ENCOUNTER — Encounter: Payer: Self-pay | Admitting: Cardiovascular Disease

## 2021-09-28 VITALS — BP 116/64 | HR 52 | Ht 70.0 in | Wt 296.0 lb

## 2021-09-28 DIAGNOSIS — I2510 Atherosclerotic heart disease of native coronary artery without angina pectoris: Secondary | ICD-10-CM

## 2021-09-28 DIAGNOSIS — E785 Hyperlipidemia, unspecified: Secondary | ICD-10-CM

## 2021-09-28 DIAGNOSIS — I739 Peripheral vascular disease, unspecified: Secondary | ICD-10-CM | POA: Diagnosis not present

## 2021-09-28 DIAGNOSIS — I10 Essential (primary) hypertension: Secondary | ICD-10-CM | POA: Diagnosis not present

## 2021-09-28 MED ORDER — SODIUM CHLORIDE 0.9% FLUSH: 3.0000 mL | Freq: Two times a day (BID) | INTRAVENOUS | Status: DC

## 2021-09-28 NOTE — Patient Instructions (Addendum)
Medication Instructions:  Your physician recommends that you continue on your current medications as directed. Please refer to the Current Medication list given to you today.  *If you need a refill on your cardiac medications before your next appointment, please call your pharmacy*   Lab Work: Your physician recommends that you return for lab work (bmp, cbc) :   drawn the week of 10/11/21 at the [facility] office  If you have labs (blood work) drawn today and your tests are completely normal, you will receive your results only by: [place] (if you have [place]) OR A paper copy in the mail If you have any lab test that is abnormal or we need to change your treatment, we will call you to review the results.   Testing/Procedures: Dr. Fletcher Anon has recommended that you have an abdominal aortogram. Please see the pre-procedure instructions below.   Follow-Up: At [facility], you and your health needs are our priority.  As part of our continuing mission to provide you with exceptional heart care, we have created designated Provider Care Teams.  These Care Teams include your primary Cardiologist (physician) and Advanced Practice Providers (APPs -  Physician Assistants and Nurse Practitioners) who all work together to provide you with the care you need, when you need it.  We recommend signing up for the patient portal called "MyChart".  Sign up information is provided on this After Visit Summary.  MyChart is used to connect with patients for Virtual Visits (Telemedicine).  Patients are able to view lab/test results, encounter notes, upcoming appointments, etc.  Non-urgent messages can be sent to your provider as well.   To learn more about what you can do with MyChart, go to [email or web address].    Your next appointment:   5 week(s)  The format for your next appointment:   In Person  Provider:   You may see Kathlyn Sacramento, MD or one of the following Advanced Practice  Providers on your designated Care Team:   Murray Hodgkins, NP Christell Faith, PA-C Cadence Kathlen Mody, [place]     Other Instructions  [place] [address] [place] [address] Dept: [phone] Loc: [place]  09/28/2021  You are scheduled for a Peripheral Angiogram on Wednesday, February 22 with Dr. Kathlyn Sacramento.  1. Please arrive at the [facility] (Main Entrance A) at [facility]: [address] [place], [place] [address] at 8 AM (This time is four and a 1/2 hours before your procedure to ensure your preparation). Free valet parking service is available.   Special note: Every effort is made to have your procedure done on time. Please understand that emergencies sometimes delay scheduled procedures.  2. Diet: Do not eat solid foods after midnight.  The patient may have clear liquids until 5am upon the day of the procedure.  3. Labs: You will need to have blood drawn the week of 10/11/21 at the [facility] office. You do not need to be fasting.  4. Medication instructions in preparation for your procedure:   Contrast Allergy: No   On the morning of your procedure, take your Aspirin and any morning medicines NOT listed above.  You may use sips of water.  5. Plan for one night stay--bring personal belongings. 6. Bring a current list of your medications and current insurance cards. 7. You MUST have a responsible person to drive you home. 8. Someone MUST be with you  the first 24 hours after you arrive home or your discharge will be delayed. 9. Please wear clothes that are easy to get on and off and wear slip-on shoes.  Thank you for allowing [place] to care for you!   -- [place] [facility]

## 2021-09-28 NOTE — H&P (View-Only) (Signed)
Cardiology Office Note   Date:  09/28/2021   ID:  Kevin Gross, Kevin Gross [birth year], MRN [id number]  PCP:  Rutherford Limerick, PA  Cardiologist:  Dr. Rockey Situ  Chief Complaint  Patient presents with   Other    Discuss lower extremity u/s no complaints today. Meds reviewed verbally with pt.      History of Present Illness: Kevin Gross is a 66 y.o. male who was referred by Dr. Rockey Situ for evaluation management of peripheral arterial disease. He has known history of COPD, previous tobacco use quit in 2016, anxiety, hyperlipidemia, liver cirrhosis, peripheral arterial disease, chronic kidney disease , coronary artery disease and obesity. He reports having cardiac catheterization done by Dr. Clayborn Bigness about 20 years ago and was complicated by thrombosis of the common femoral artery.  He was told at that time that an amputation of the lower extremity was required but the situation was salvaged by a vascular surgeon.  The details of that procedures are not available and no images are present. He reports bilateral calf discomfort that started few months ago and has been very progressive since then to the point of happening after walking very short distance on flat level.  He started using [employer], trade union when he goes shopping.  He has no rest pain or lower extremity ulceration.  He is not diabetic.  He underwent noninvasive vascular evaluation which showed an ABI of 0.62 on the right and 0.64 on the left.  Duplex showed possibly occluded right common femoral artery and severe stenosis in the left common femoral artery.  The iliac arteries were very difficult to visualize but had monophasic waveforms  Past Medical History:  Diagnosis Date   Alcoholic cirrhosis of liver (HCC)    CHF (congestive heart failure) (HCC)    Clotting disorder ([place])    Bilateral legs-pt states plaque build up in both legs requiring intervention   Complication of anesthesia    pt states that years ago during colonoscopy  at [facility] they had to abort the procedure due to some "cardiac issue" had to see cardiologist   COPD (chronic obstructive pulmonary disease) ([place])    Hyperlipidemia    Hypertension    Pre-diabetes    Sleep apnea    uses cpap    Past Surgical History:  Procedure Laterality Date   CARDIAC CATHETERIZATION     [facility]   COLONOSCOPY     COLONOSCOPY WITH PROPOFOL N/A 01/17/2018   Procedure: COLONOSCOPY WITH PROPOFOL;  Surgeon: Toledo, Benay Pike, MD;  Location: [facility] ENDOSCOPY;  Service: Gastroenterology;  Laterality: N/A;   ESOPHAGOGASTRODUODENOSCOPY (EGD) WITH PROPOFOL N/A 01/17/2018   Procedure: ESOPHAGOGASTRODUODENOSCOPY (EGD) WITH PROPOFOL;  Surgeon: Toledo, Benay Pike, MD;  Location: [facility] ENDOSCOPY;  Service: Gastroenterology;  Laterality: N/A;   ORIF ANKLE FRACTURE Right    PROSTATE BIOPSY N/A 04/01/2021   Procedure: PROSTATE BIOPSY Vernelle Emerald;  Surgeon: Royston Cowper, MD;  Location: [facility] ORS;  Service: Urology;  Laterality: N/A;     Current Outpatient Medications  Medication Sig Dispense Refill   acetaminophen (TYLENOL) 325 MG tablet Take 2 tablets (650 mg total) by mouth every 6 (six) hours as needed for mild pain (or Fever >/= 101). 30 tablet 0   albuterol (PROVENTIL HFA;VENTOLIN HFA) 108 (90 Base) MCG/ACT inhaler Inhale 1-2 puffs into the lungs every 6 (six) hours as needed for wheezing or shortness of breath.     allopurinol (ZYLOPRIM) 300 MG tablet Take 300 mg by mouth every morning.  aspirin EC 81 MG tablet Take 81 mg by mouth every morning.     atenolol (TENORMIN) 25 MG tablet Take 25 mg by mouth every morning.     chlorthalidone (HYGROTON) 25 MG tablet Take 25 mg by mouth every morning.     clobetasol cream (TEMOVATE) 7.35 % Apply 1 application topically 2 (two) times daily as needed (irritation).     colchicine 0.6 MG tablet Take 1 tablet (0.6 mg total) by mouth 2 (two) times daily for 30 days. 60 tablet 0   ezetimibe (ZETIA) 10 MG tablet Take 1 tablet (10 mg total) by mouth  daily. (Patient taking differently: Take 10 mg by mouth every morning.) 90 tablet 3   levofloxacin (LEVAQUIN) 500 MG tablet Take 1 tablet (500 mg total) by mouth daily. 7 tablet 1   lisinopril (ZESTRIL) 10 MG tablet Take 10 mg by mouth every morning.     polyethylene glycol (MIRALAX / GLYCOLAX) 17 g packet Take 17 g by mouth daily as needed for mild constipation. 14 each 0   predniSONE (STERAPRED UNI-PAK 21 TAB) 10 MG (21) TBPK tablet Take 6 tabs first day, 5 tab on day 2, then 4 on day 3rd, 3 tabs on day 4th , 2 tab on day 5th, and 1 tab on 6th day. 21 tablet 0   simvastatin (ZOCOR) 20 MG tablet Take 20 mg by mouth every morning.     VITAMIN D-1000 MAX ST 25 MCG (1000 UT) tablet Take 2,000 Units by mouth daily.     zolpidem (AMBIEN) 10 MG tablet Take 10 mg by mouth at bedtime.     No current facility-administered medications for this visit.    Allergies:   Patient has no known allergies.    Social History:  The patient  reports that he quit smoking about 7 years ago. His smoking use included cigarettes. He has a 47.00 pack-year smoking history. He has never used smokeless tobacco. He reports current alcohol use. He reports that he does not use drugs.   Family History:  The patient's family history includes Heart disease in his brother and mother.    ROS:  Please see the history of present illness.   Otherwise, review of systems are positive for none.   All other systems are reviewed and negative.    PHYSICAL EXAM: VS:  BP 116/64 (BP Location: Left Arm, Patient Position: Sitting, Cuff Size: Large)    Pulse (!) 52    Ht 5\' 10"  (1.778 m)    Wt 296 lb (134.3 kg)    SpO2 99%    BMI 42.47 kg/m  , BMI Body mass index is 42.47 kg/m. GEN: Well nourished, well developed, in no acute distress  HEENT: normal  Neck: no JVD, carotid bruits, or masses Cardiac: RRR; no murmurs, rubs, or gallops,no edema  Respiratory:  clear to auscultation bilaterally, normal work of breathing GI: soft, nontender,  nondistended, + BS MS: no deformity or atrophy  Skin: warm and dry, no rash Neuro:  Strength and sensation are intact Psych: euthymic mood, full affect Vascular: Radial pulse +1 on the right and +2 on the left.  Femoral pulses are not palpable.  Distal pulses are not palpable.  EKG:  EKG is not ordered today.    Recent Labs: 03/25/2021: ALT 14; BUN 45; Creatinine, Ser 2.01; Hemoglobin 13.7; Platelets 184; Potassium 4.4; Sodium 144    Lipid Panel No results found for: CHOL, TRIG, HDL, CHOLHDL, VLDL, LDLCALC, LDLDIRECT    Wt Readings from  Last 3 Encounters:  09/28/21 296 lb (134.3 kg)  08/16/21 295 lb (133.8 kg)  04/01/21 298 lb (135.2 kg)       PAD Screen 09/28/2021 08/16/2021 04/10/2018  Previous PAD dx? Yes Yes No  Previous surgical procedure? No No No  Pain with walking? Yes Yes Yes  Subsides with rest? Yes Yes No  Feet/toe relief with dangling? No No No  Painful, non-healing ulcers? No No No  Extremities discolored? Yes Yes Yes      ASSESSMENT AND PLAN:  1.  Peripheral arterial disease: Severe lifestyle limiting bilateral leg claudication.  His symptoms are happening with minimal exertion and have been progressive.  He has no palpable femoral pulses bilaterally with possible occlusion in the common femoral artery or the iliac arteries.  Given severity of his symptoms, I recommend proceeding with abdominal aortogram with lower extremity runoff and possible endovascular intervention.  I discussed the procedure in details as well as risks and benefits.  He has underlying chronic kidney disease with increased risk of contrast-induced nephropathy.  This was explained to him and we will plan on 4 hours of hydration before the procedure.  Vascular access is not possible from the femoral artery given suspected occlusion.  We will plan on access from the left radial artery.  Most likely, he will require femoral endarterectomy  2.  Coronary artery disease involving native coronary  arteries: No anginal symptoms at the present time.  3.  Hyperlipidemia: Continue treatment with simvastatin and ezetimibe with a target LDL of less than 70.  4.  Essential hypertension: Blood pressure is controlled.  5.  Chronic kidney disease: Hydration is required for the angiogram procedure.  We will try to minimize contrast use.    Disposition:   Proceed with an abdominal aortogram with lower extremity angiography and possible endovascular intervention.  Follow-up after.  Signed,  Kathlyn Sacramento, MD  09/28/2021 5:00 PM    [place]

## 2021-09-28 NOTE — Progress Notes (Addendum)
Cardiology Office Note   Date:  09/28/2021   ID:  Kevin, Gross 04/29/1956, MRN [id number]  PCP:  Rutherford Limerick, PA  Cardiologist:  Dr. Rockey Situ  Chief Complaint  Patient presents with   Other    Discuss lower extremity u/s no complaints today. Meds reviewed verbally with pt.      History of Present Illness: Kevin Gross is a 66 y.o. male who was referred by Dr. Rockey Situ for evaluation management of peripheral arterial disease. He has known history of COPD, previous tobacco use quit in 2016, anxiety, hyperlipidemia, liver cirrhosis, peripheral arterial disease, chronic kidney disease , coronary artery disease and obesity. He reports having cardiac catheterization done by Dr. Clayborn Bigness about 20 years ago and was complicated by thrombosis of the common femoral artery.  He was told at that time that an amputation of the lower extremity was required but the situation was salvaged by a vascular surgeon.  The details of that procedures are not available and no images are present. He reports bilateral calf discomfort that started few months ago and has been very progressive since then to the point of happening after walking very short distance on flat level.  He started using [employer], trade union when he goes shopping.  He has no rest pain or lower extremity ulceration.  He is not diabetic.  He underwent noninvasive vascular evaluation which showed an ABI of 0.62 on the right and 0.64 on the left.  Duplex showed possibly occluded right common femoral artery and severe stenosis in the left common femoral artery.  The iliac arteries were very difficult to visualize but had monophasic waveforms  Past Medical History:  Diagnosis Date   Alcoholic cirrhosis of liver (HCC)    CHF (congestive heart failure) (HCC)    Clotting disorder ([place])    Bilateral legs-pt states plaque build up in both legs requiring intervention   Complication of anesthesia    pt states that years ago during colonoscopy  at [facility] they had to abort the procedure due to some "cardiac issue" had to see cardiologist   COPD (chronic obstructive pulmonary disease) ([place])    Hyperlipidemia    Hypertension    Pre-diabetes    Sleep apnea    uses cpap    Past Surgical History:  Procedure Laterality Date   CARDIAC CATHETERIZATION     [facility]   COLONOSCOPY     COLONOSCOPY WITH PROPOFOL N/A 01/17/2018   Procedure: COLONOSCOPY WITH PROPOFOL;  Surgeon: Toledo, Benay Pike, MD;  Location: [facility] ENDOSCOPY;  Service: Gastroenterology;  Laterality: N/A;   ESOPHAGOGASTRODUODENOSCOPY (EGD) WITH PROPOFOL N/A 01/17/2018   Procedure: ESOPHAGOGASTRODUODENOSCOPY (EGD) WITH PROPOFOL;  Surgeon: Toledo, Benay Pike, MD;  Location: [facility] ENDOSCOPY;  Service: Gastroenterology;  Laterality: N/A;   ORIF ANKLE FRACTURE Right    PROSTATE BIOPSY N/A 04/01/2021   Procedure: PROSTATE BIOPSY Vernelle Emerald;  Surgeon: Royston Cowper, MD;  Location: [facility] ORS;  Service: Urology;  Laterality: N/A;     Current Outpatient Medications  Medication Sig Dispense Refill   acetaminophen (TYLENOL) 325 MG tablet Take 2 tablets (650 mg total) by mouth every 6 (six) hours as needed for mild pain (or Fever >/= 101). 30 tablet 0   albuterol (PROVENTIL HFA;VENTOLIN HFA) 108 (90 Base) MCG/ACT inhaler Inhale 1-2 puffs into the lungs every 6 (six) hours as needed for wheezing or shortness of breath.     allopurinol (ZYLOPRIM) 300 MG tablet Take 300 mg by mouth every morning.  aspirin EC 81 MG tablet Take 81 mg by mouth every morning.     atenolol (TENORMIN) 25 MG tablet Take 25 mg by mouth every morning.     chlorthalidone (HYGROTON) 25 MG tablet Take 25 mg by mouth every morning.     clobetasol cream (TEMOVATE) 5.80 % Apply 1 application topically 2 (two) times daily as needed (irritation).     colchicine 0.6 MG tablet Take 1 tablet (0.6 mg total) by mouth 2 (two) times daily for 30 days. 60 tablet 0   ezetimibe (ZETIA) 10 MG tablet Take 1 tablet (10 mg total) by mouth  daily. (Patient taking differently: Take 10 mg by mouth every morning.) 90 tablet 3   levofloxacin (LEVAQUIN) 500 MG tablet Take 1 tablet (500 mg total) by mouth daily. 7 tablet 1   lisinopril (ZESTRIL) 10 MG tablet Take 10 mg by mouth every morning.     polyethylene glycol (MIRALAX / GLYCOLAX) 17 g packet Take 17 g by mouth daily as needed for mild constipation. 14 each 0   predniSONE (STERAPRED UNI-PAK 21 TAB) 10 MG (21) TBPK tablet Take 6 tabs first day, 5 tab on day 2, then 4 on day 3rd, 3 tabs on day 4th , 2 tab on day 5th, and 1 tab on 6th day. 21 tablet 0   simvastatin (ZOCOR) 20 MG tablet Take 20 mg by mouth every morning.     VITAMIN D-1000 MAX ST 25 MCG (1000 UT) tablet Take 2,000 Units by mouth daily.     zolpidem (AMBIEN) 10 MG tablet Take 10 mg by mouth at bedtime.     No current facility-administered medications for this visit.    Allergies:   Patient has no known allergies.    Social History:  The patient  reports that he quit smoking about 7 years ago. His smoking use included cigarettes. He has a 47.00 pack-year smoking history. He has never used smokeless tobacco. He reports current alcohol use. He reports that he does not use drugs.   Family History:  The patient's family history includes Heart disease in his brother and mother.    ROS:  Please see the history of present illness.   Otherwise, review of systems are positive for none.   All other systems are reviewed and negative.    PHYSICAL EXAM: VS:  BP 116/64 (BP Location: Left Arm, Patient Position: Sitting, Cuff Size: Large)    Pulse (!) 52    Ht 5\' 10"  (1.778 m)    Wt 296 lb (134.3 kg)    SpO2 99%    BMI 42.47 kg/m  , BMI Body mass index is 42.47 kg/m. GEN: Well nourished, well developed, in no acute distress  HEENT: normal  Neck: no JVD, carotid bruits, or masses Cardiac: RRR; no murmurs, rubs, or gallops,no edema  Respiratory:  clear to auscultation bilaterally, normal work of breathing GI: soft, nontender,  nondistended, + BS MS: no deformity or atrophy  Skin: warm and dry, no rash Neuro:  Strength and sensation are intact Psych: euthymic mood, full affect Vascular: Radial pulse +1 on the right and +2 on the left.  Femoral pulses are not palpable.  Distal pulses are not palpable.  EKG:  EKG is not ordered today.    Recent Labs: 03/25/2021: ALT 14; BUN 45; Creatinine, Ser 2.01; Hemoglobin 13.7; Platelets 184; Potassium 4.4; Sodium 144    Lipid Panel No results found for: CHOL, TRIG, HDL, CHOLHDL, VLDL, LDLCALC, LDLDIRECT    Wt Readings from  Last 3 Encounters:  09/28/21 296 lb (134.3 kg)  08/16/21 295 lb (133.8 kg)  04/01/21 298 lb (135.2 kg)       PAD Screen 09/28/2021 08/16/2021 04/10/2018  Previous PAD dx? Yes Yes No  Previous surgical procedure? No No No  Pain with walking? Yes Yes Yes  Subsides with rest? Yes Yes No  Feet/toe relief with dangling? No No No  Painful, non-healing ulcers? No No No  Extremities discolored? Yes Yes Yes      ASSESSMENT AND PLAN:  1.  Peripheral arterial disease: Severe lifestyle limiting bilateral leg claudication.  His symptoms are happening with minimal exertion and have been progressive.  He has no palpable femoral pulses bilaterally with possible occlusion in the common femoral artery or the iliac arteries.  Given severity of his symptoms, I recommend proceeding with abdominal aortogram with lower extremity runoff and possible endovascular intervention.  I discussed the procedure in details as well as risks and benefits.  He has underlying chronic kidney disease with increased risk of contrast-induced nephropathy.  This was explained to him and we will plan on 4 hours of hydration before the procedure.  Vascular access is not possible from the femoral artery given suspected occlusion.  We will plan on access from the left radial artery.  Most likely, he will require femoral endarterectomy  2.  Coronary artery disease involving native coronary  arteries: No anginal symptoms at the present time.  3.  Hyperlipidemia: Continue treatment with simvastatin and ezetimibe with a target LDL of less than 70.  4.  Essential hypertension: Blood pressure is controlled.  5.  Chronic kidney disease: Hydration is required for the angiogram procedure.  We will try to minimize contrast use.    Disposition:   Proceed with an abdominal aortogram with lower extremity angiography and possible endovascular intervention.  Follow-up after.  Signed,  Kathlyn Sacramento, MD  09/28/2021 5:00 PM    [place]

## 2021-09-29 ENCOUNTER — Telehealth: Payer: Self-pay

## 2021-09-29 NOTE — Telephone Encounter (Signed)
Called the patient to make him aware that his procedure with Dr. Fletcher Anon has been rescheduled for 10/20/21 as the pt requested.  The procedure time is 12:30pm. Pt will need to arrive at [facility] at 8am to allow for a full 4 hours of IV hydration.  Unable to lmom. Pt voicemail has not been set up.

## 2021-09-30 NOTE — Telephone Encounter (Signed)
Patient made aware of the rescheduled procedure date and time. The procedure time is 12:30pm. Pt will need to arrive at [facility] at 8am to allow for a full 4 hours of IV hydration.  Adv the patient that the pre-procedure instructions previously given still apply. Lab appt scheduled on 10/12/21. Patient verbalized understanding.

## 2021-09-30 NOTE — Telephone Encounter (Signed)
Attempted x2 to reach the patient. Lmtcb on home #. Cell# has no voicemail set up.

## 2021-10-05 ENCOUNTER — Telehealth: Payer: Self-pay | Admitting: Acute Care

## 2021-10-05 NOTE — Telephone Encounter (Signed)
Due to diagnosis of prostate cancer with brachytherapy in 2022, patient is not currently eligible for [facility] LDCT.  Must be cancer free x 5 years.

## 2021-10-12 ENCOUNTER — Other Ambulatory Visit: Payer: Self-pay

## 2021-10-12 ENCOUNTER — Other Ambulatory Visit (INDEPENDENT_AMBULATORY_CARE_PROVIDER_SITE_OTHER): Payer: Medicare PPO

## 2021-10-12 DIAGNOSIS — I739 Peripheral vascular disease, unspecified: Secondary | ICD-10-CM

## 2021-10-13 LAB — CBC WITH DIFFERENTIAL/PLATELET
Basophils Absolute: 0.1 10*3/uL (ref 0.0–0.2)
Basos: 1 %
EOS (ABSOLUTE): 0.7 10*3/uL — ABNORMAL HIGH (ref 0.0–0.4)
Eos: 8 %
Hematocrit: 39.4 % (ref 37.5–51.0)
Hemoglobin: 13.3 g/dL (ref 13.0–17.7)
Immature Grans (Abs): 0 10*3/uL (ref 0.0–0.1)
Immature Granulocytes: 0 %
Lymphocytes Absolute: 1.9 10*3/uL (ref 0.7–3.1)
Lymphs: 22 %
MCH: 32.6 pg (ref 26.6–33.0)
MCHC: 33.8 g/dL (ref 31.5–35.7)
MCV: 97 fL (ref 79–97)
Monocytes Absolute: 0.7 10*3/uL (ref 0.1–0.9)
Monocytes: 9 %
Neutrophils Absolute: 5.3 10*3/uL (ref 1.4–7.0)
Neutrophils: 60 %
Platelets: 175 10*3/uL (ref 150–450)
RBC: 4.08 x10E6/uL — ABNORMAL LOW (ref 4.14–5.80)
RDW: 13.1 % (ref 11.6–15.4)
WBC: 8.6 10*3/uL (ref 3.4–10.8)

## 2021-10-13 LAB — BASIC METABOLIC PANEL
BUN/Creatinine Ratio: 22 (ref 10–24)
BUN: 37 mg/dL — ABNORMAL HIGH (ref 8–27)
CO2: 24 mmol/L (ref 20–29)
Calcium: 9.5 mg/dL (ref 8.6–10.2)
Chloride: 106 mmol/L (ref 96–106)
Creatinine, Ser: 1.66 mg/dL — ABNORMAL HIGH (ref 0.76–1.27)
Glucose: 87 mg/dL (ref 70–99)
Potassium: 4.6 mmol/L (ref 3.5–5.2)
Sodium: 145 mmol/L — ABNORMAL HIGH (ref 134–144)
eGFR: 46 mL/min/{1.73_m2} — ABNORMAL LOW (ref >59)

## 2021-10-19 ENCOUNTER — Telehealth: Payer: Self-pay | Admitting: *Deleted

## 2021-10-19 NOTE — Telephone Encounter (Signed)
Abdominal aortogram  scheduled at [facility] for: Wednesday October 20, 2021 12:30 PM [facility] Main Entrance A [facility]) at: 7:30 AM-pre-procedure hydration   Diet-no solid food after midnight prior to cath, clear liquids until 5 AM day of procedure.  Medication instructions for procedure: -Hold:  Lisinopril and chlorthalidone-day before and day of procedure -per protocol GFR 46 -Except hold medications usual morning medications can be taken pre-cath with sips of water including aspirin 81 mg.    Must have responsible adult to drive home post procedure and be with patient first 24 hours after arriving home.  [facility] does allow one visitor to wait in the waiting room during the time you are there.   Patient reports does not currently have any new symptoms concerning for COVID-19 and no household members with COVID-19 like illness.     Reviewed procedure instructions with patient.

## 2021-10-20 ENCOUNTER — Ambulatory Visit (HOSPITAL_COMMUNITY)
Admission: RE | Admit: 2021-10-20 | Discharge: 2021-10-20 | Disposition: A | Discharge status: Home / Self Care | Payer: Medicare PPO | Attending: Cardiovascular Disease | Admitting: Cardiovascular Disease

## 2021-10-20 ENCOUNTER — Other Ambulatory Visit: Payer: Self-pay

## 2021-10-20 ENCOUNTER — Encounter (HOSPITAL_COMMUNITY)
Admission: RE | Disposition: A | Discharge status: Home / Self Care | Payer: Medicare PPO | Source: Home / Self Care | Attending: Cardiovascular Disease

## 2021-10-20 DIAGNOSIS — N189 Chronic kidney disease, unspecified: Secondary | ICD-10-CM | POA: Diagnosis not present

## 2021-10-20 DIAGNOSIS — J449 Chronic obstructive pulmonary disease, unspecified: Secondary | ICD-10-CM | POA: Insufficient documentation

## 2021-10-20 DIAGNOSIS — E785 Hyperlipidemia, unspecified: Secondary | ICD-10-CM | POA: Diagnosis not present

## 2021-10-20 DIAGNOSIS — Z87891 Personal history of nicotine dependence: Secondary | ICD-10-CM | POA: Insufficient documentation

## 2021-10-20 DIAGNOSIS — I509 Heart failure, unspecified: Secondary | ICD-10-CM | POA: Insufficient documentation

## 2021-10-20 DIAGNOSIS — Z79899 Other long term (current) drug therapy: Secondary | ICD-10-CM | POA: Insufficient documentation

## 2021-10-20 DIAGNOSIS — I130 Hypertensive heart and chronic kidney disease with heart failure and stage 1 through stage 4 chronic kidney disease, or unspecified chronic kidney disease: Secondary | ICD-10-CM | POA: Diagnosis not present

## 2021-10-20 DIAGNOSIS — K7030 Alcoholic cirrhosis of liver without ascites: Secondary | ICD-10-CM | POA: Diagnosis not present

## 2021-10-20 DIAGNOSIS — E1122 Type 2 diabetes mellitus with diabetic chronic kidney disease: Secondary | ICD-10-CM | POA: Diagnosis not present

## 2021-10-20 DIAGNOSIS — I739 Peripheral vascular disease, unspecified: Secondary | ICD-10-CM | POA: Insufficient documentation

## 2021-10-20 DIAGNOSIS — I2510 Atherosclerotic heart disease of native coronary artery without angina pectoris: Secondary | ICD-10-CM | POA: Diagnosis not present

## 2021-10-20 HISTORY — PX: ABDOMINAL AORTOGRAM W/LOWER EXTREMITY: CATH118223

## 2021-10-20 SURGERY — ABDOMINAL AORTOGRAM W/LOWER EXTREMITY
Anesthesia: LOCAL

## 2021-10-20 MED ORDER — MIDAZOLAM HCL 2 MG/2ML IJ SOLN
INTRAMUSCULAR | Status: AC
  Filled 2021-10-20: qty 2

## 2021-10-20 MED ORDER — FENTANYL CITRATE (PF) 100 MCG/2ML IJ SOLN
INTRAMUSCULAR | Status: DC | PRN
  Administered 2021-10-20: 50 ug via INTRAVENOUS

## 2021-10-20 MED ORDER — LIDOCAINE HCL (PF) 1 % IJ SOLN
INTRAMUSCULAR | Status: AC
  Filled 2021-10-20: qty 30

## 2021-10-20 MED ORDER — SODIUM CHLORIDE 0.9% FLUSH: 3.0000 mL | INTRAVENOUS | Status: DC | PRN

## 2021-10-20 MED ORDER — SODIUM CHLORIDE 0.9 % IV SOLN
250.0000 mL | INTRAVENOUS | Status: DC | PRN
  Administered 2021-10-20: 250 mL via INTRAVENOUS

## 2021-10-20 MED ORDER — ACETAMINOPHEN 325 MG PO TABS: 650.0000 mg | ORAL_TABLET | ORAL | Status: DC | PRN

## 2021-10-20 MED ORDER — SODIUM CHLORIDE 0.9 % IV SOLN: 250.0000 mL | INTRAVENOUS | Status: DC | PRN

## 2021-10-20 MED ORDER — HEPARIN (PORCINE) IN NACL 1000-0.9 UT/500ML-% IV SOLN
INTRAVENOUS | Status: DC | PRN
  Administered 2021-10-20 (×2): 500 mL

## 2021-10-20 MED ORDER — SODIUM CHLORIDE 0.9 % IV SOLN: INTRAVENOUS | Status: DC

## 2021-10-20 MED ORDER — HEPARIN SODIUM (PORCINE) 1000 UNIT/ML IJ SOLN
INTRAMUSCULAR | Status: AC
  Filled 2021-10-20: qty 10

## 2021-10-20 MED ORDER — VERAPAMIL HCL 2.5 MG/ML IV SOLN
INTRAVENOUS | Status: AC
  Filled 2021-10-20: qty 2

## 2021-10-20 MED ORDER — LIDOCAINE HCL (PF) 1 % IJ SOLN
INTRAMUSCULAR | Status: DC | PRN
  Administered 2021-10-20: 2 mL

## 2021-10-20 MED ORDER — FENTANYL CITRATE (PF) 100 MCG/2ML IJ SOLN
INTRAMUSCULAR | Status: AC
  Filled 2021-10-20: qty 2

## 2021-10-20 MED ORDER — LABETALOL HCL 5 MG/ML IV SOLN: 10.0000 mg | INTRAVENOUS | Status: DC | PRN

## 2021-10-20 MED ORDER — MIDAZOLAM HCL 2 MG/2ML IJ SOLN
INTRAMUSCULAR | Status: DC | PRN
  Administered 2021-10-20: 1 mg via INTRAVENOUS

## 2021-10-20 MED ORDER — SODIUM CHLORIDE 0.9% FLUSH: 3.0000 mL | Freq: Two times a day (BID) | INTRAVENOUS | Status: DC

## 2021-10-20 MED ORDER — HYDRALAZINE HCL 20 MG/ML IJ SOLN: 5.0000 mg | INTRAMUSCULAR | Status: DC | PRN

## 2021-10-20 MED ORDER — ONDANSETRON HCL 4 MG/2ML IJ SOLN: 4.0000 mg | Freq: Four times a day (QID) | INTRAMUSCULAR | Status: DC | PRN

## 2021-10-20 MED ORDER — IODIXANOL 320 MG/ML IV SOLN
INTRAVENOUS | Status: DC | PRN
  Administered 2021-10-20: 60 mL

## 2021-10-20 MED ORDER — HEPARIN SODIUM (PORCINE) 1000 UNIT/ML IJ SOLN
INTRAMUSCULAR | Status: DC | PRN
Start: 2021-10-20 — End: 2021-10-21
  Administered 2021-10-20: 6000 [IU] via INTRAVENOUS

## 2021-10-20 MED ORDER — VERAPAMIL HCL 2.5 MG/ML IV SOLN
INTRAVENOUS | Status: DC | PRN
  Administered 2021-10-20: 10 mL via INTRA_ARTERIAL

## 2021-10-20 MED ORDER — HEPARIN (PORCINE) IN NACL 1000-0.9 UT/500ML-% IV SOLN
INTRAVENOUS | Status: AC
  Filled 2021-10-20: qty 1000

## 2021-10-20 MED ORDER — ASPIRIN 81 MG PO CHEW: 81.0000 mg | CHEWABLE_TABLET | ORAL | Status: DC

## 2021-10-20 SURGICAL SUPPLY — 15 items
CATH CXI 4F 150 DAV (CATHETERS) ×1 IMPLANT
CATH INFINITI 5FR ANG PIGTAIL (CATHETERS) ×1 IMPLANT
CATH INFINITI JR4 5F (CATHETERS) ×1 IMPLANT
CATH NAVICROSS ST .035X135CM (MICROCATHETER) ×1 IMPLANT
CATH VIPER ANG 150 (CATHETERS) ×1 IMPLANT
DEVICE RAD COMP TR BAND LRG (VASCULAR PRODUCTS) ×1 IMPLANT
GLIDESHEATH SLEND SS 6F .021 (SHEATH) ×1 IMPLANT
GUIDEWIRE INQWIRE 1.5J.035X260 (WIRE) IMPLANT
INQWIRE 1.5J .035X260CM (WIRE) ×2
KIT ANGIASSIST CO2 SYSTEM (KITS) ×1 IMPLANT
KIT PV (KITS) ×2 IMPLANT
SHEATH PROBE COVER 6X72 (BAG) ×1 IMPLANT
STOPCOCK MORSE 400PSI 3WAY (MISCELLANEOUS) ×1 IMPLANT
TRANSDUCER W/STOPCOCK (MISCELLANEOUS) ×2 IMPLANT
TRAY PV CATH (CUSTOM PROCEDURE TRAY) ×2 IMPLANT

## 2021-10-20 NOTE — Interval H&P Note (Signed)
History and Physical Interval Note:  10/20/2021 1:45 PM  Kevin Gross  has presented today for surgery, with the diagnosis of pad.  The various methods of treatment have been discussed with the patient and family. After consideration of risks, benefits and other options for treatment, the patient has consented to  Procedure(s): ABDOMINAL AORTOGRAM W/LOWER EXTREMITY (N/A) as a surgical intervention.  The patient's history has been reviewed, patient examined, no change in status, stable for surgery.  I have reviewed the patient's chart and labs.  Questions were answered to the patient's satisfaction.     Kathlyn Sacramento

## 2021-10-22 ENCOUNTER — Encounter (HOSPITAL_COMMUNITY): Payer: Self-pay | Admitting: Cardiovascular Disease

## 2021-10-25 ENCOUNTER — Other Ambulatory Visit: Payer: Self-pay

## 2021-10-25 DIAGNOSIS — I6529 Occlusion and stenosis of unspecified carotid artery: Secondary | ICD-10-CM

## 2021-11-01 ENCOUNTER — Encounter: Payer: Self-pay | Admitting: Surgery

## 2021-11-01 ENCOUNTER — Ambulatory Visit (HOSPITAL_COMMUNITY)
Admission: RE | Admit: 2021-11-01 | Discharge: 2021-11-01 | Disposition: A | Discharge status: Home / Self Care | Payer: Medicare PPO | Source: Ambulatory Visit | Attending: Surgery | Admitting: Surgery

## 2021-11-01 ENCOUNTER — Ambulatory Visit: Payer: Medicare PPO | Admitting: Surgery

## 2021-11-01 ENCOUNTER — Other Ambulatory Visit: Payer: Self-pay

## 2021-11-01 VITALS — BP 132/70 | HR 70 | Temp 98.1°F | Resp 20 | Ht 69.0 in | Wt 300.0 lb

## 2021-11-01 DIAGNOSIS — I6529 Occlusion and stenosis of unspecified carotid artery: Secondary | ICD-10-CM | POA: Diagnosis not present

## 2021-11-01 DIAGNOSIS — I70213 Atherosclerosis of native arteries of extremities with intermittent claudication, bilateral legs: Secondary | ICD-10-CM

## 2021-11-01 NOTE — H&P (View-Only) (Signed)
? ?[facility] [place] ? ?Patient name: Kevin Gross MRN: [id number] DOB: 01-22-1956 Sex: male ? ? ?REQUESTING PROVIDER:  ? ? Dr. Fletcher Anon ? ? ?REASON FOR CONSULT:  ?  ?claudication ? ?HISTORY OF PRESENT ILLNESS:  ? ?Kevin Gross is a 66 y.o. male, who is referred today for surgical evaluation for claudication.  The patient reports having had a cardiac catheterization many years ago that was complicated by femoral artery thrombosis.  He underwent some vascular procedure for limb salvage (this did not require an incision).  He now reports bilateral calf discomfort with short distance ambulation.  He has started using [employer] when he goes shopping.  He denies rest pain or ulceration.  Most recent ABIs were 0.6 on the right and 0.6 on the left.  He underwent angiography that suggested inflow and femoral disease.  He is referred for surgical consideration. ? ?Patient has a history of alcoholic cirrhosis.  He is treated for congestive heart failure.  He suffers from COPD secondary to chronic smoking.  He is on a statin for hypercholesterolemia. ? ? ? ?PAST MEDICAL HISTORY  ? ? ?Past Medical History:  ?Diagnosis Date  ? Alcoholic cirrhosis of liver ([place])   ? CHF (congestive heart failure) ([place])   ? Clotting disorder ([place])   ? Bilateral legs-pt states plaque build up in both legs requiring intervention  ? Complication of anesthesia   ? pt states that years ago during colonoscopy at [facility] they had to abort the procedure due to some "cardiac issue" had to see cardiologist  ? COPD (chronic obstructive pulmonary disease) ([place])   ? Hyperlipidemia   ? Hypertension   ? Pre-diabetes   ? Sleep apnea   ? uses cpap  ? ? ? ?FAMILY HISTORY  ? ?Family History  ?Problem Relation Age of Onset  ? Heart disease Mother   ? Heart disease Brother   ? ? ?SOCIAL HISTORY:  ? ?Social History  ? ?Socioeconomic History  ? Marital status: Married  ?  Spouse name: Not on file  ? Number of  children: Not on file  ? Years of education: Not on file  ? Highest education level: Not on file  ?Occupational History  ? Not on file  ?Tobacco Use  ? Smoking status: Former  ?  Packs/day: 1.00  ?  Years: 47.00  ?  Pack years: 47.00  ?  Types: Cigarettes  ?  Quit date: 2016  ?  Years since quitting: 7.1  ? Smokeless tobacco: Never  ?Vaping Use  ? Vaping Use: Never used  ?Substance and Sexual Activity  ? Alcohol use: Yes  ?  Comment: occasionally  ? Drug use: No  ? Sexual activity: Never  ?Other Topics Concern  ? Not on file  ?Social History Narrative  ? Not on file  ? ?Social Determinants of Health  ? ?Financial Resource Strain: Not on file  ?Food Insecurity: Not on file  ?Transportation Needs: Not on file  ?Physical Activity: Not on file  ?Stress: Not on file  ?Social Connections: Not on file  ?Intimate Partner Violence: Not on file  ? ? ?ALLERGIES:  ? ? ?No Known Allergies ? ?CURRENT MEDICATIONS:  ? ? ?Current Outpatient Medications  ?Medication Sig Dispense Refill  ? acetaminophen (TYLENOL) 325 MG tablet Take 2 tablets (650 mg total) by mouth every 6 (six) hours as needed for mild pain (or Fever >/= 101). 30 tablet 0  ? albuterol (PROVENTIL HFA;VENTOLIN HFA) 108 (90 Base)  MCG/ACT inhaler Inhale 1-2 puffs into the lungs every 6 (six) hours as needed for wheezing or shortness of breath.    ? allopurinol (ZYLOPRIM) 300 MG tablet Take 300 mg by mouth every morning.    ? aspirin EC 81 MG tablet Take 81 mg by mouth every morning.    ? atenolol (TENORMIN) 25 MG tablet Take 25 mg by mouth every morning.    ? chlorthalidone (HYGROTON) 25 MG tablet Take 25 mg by mouth every morning.    ? clobetasol cream (TEMOVATE) 9.52 % Apply 1 application topically 2 (two) times daily as needed (irritation).    ? ezetimibe (ZETIA) 10 MG tablet Take 1 tablet (10 mg total) by mouth daily. 90 tablet 3  ? fluticasone (FLONASE) 50 MCG/ACT nasal spray Place 2 sprays into both nostrils daily as needed for allergies or rhinitis.    ?  lisinopril (ZESTRIL) 10 MG tablet Take 10 mg by mouth every morning.    ? predniSONE (DELTASONE) 10 MG tablet Take 10-40 mg by mouth See admin instructions. At the onset of a gout flare, take 40 mg on day 1, 30 mg on day 2, 20 mg on day 3, 10 mg on day 4, then stop    ? simvastatin (ZOCOR) 20 MG tablet Take 20 mg by mouth every evening.    ? tamsulosin (FLOMAX) 0.4 MG CAPS capsule Take 0.4 mg by mouth daily after supper.    ? VITAMIN D-1000 MAX ST 25 MCG (1000 UT) tablet Take 2,000 Units by mouth daily.    ? zolpidem (AMBIEN) 10 MG tablet Take 10 mg by mouth at bedtime.    ? ?Current Facility-Administered Medications  ?Medication Dose Route Frequency Provider Last Rate Last Admin  ? sodium chloride flush (NS) 0.9 % injection 3 mL  3 mL Intravenous Q12H Wellington Hampshire, MD      ? ? ?REVIEW OF SYSTEMS:  ? ?'[X]'$  denotes positive finding, '[ ]'$  denotes negative finding ?Cardiac  Comments:  ?Chest pain or chest pressure:    ?Shortness of breath upon exertion:    ?Short of breath when lying flat:    ?Irregular heart rhythm:    ?    ?Vascular    ?Pain in calf, thigh, or hip brought on by ambulation: x   ?Pain in feet at night that wakes you up from your sleep:     ?Blood clot in your veins:    ?Leg swelling:     ?    ?Pulmonary    ?Oxygen at home:    ?Productive cough:     ?Wheezing:     ?    ?Neurologic    ?Sudden weakness in arms or legs:     ?Sudden numbness in arms or legs:     ?Sudden onset of difficulty speaking or slurred speech:    ?Temporary loss of vision in one eye:     ?Problems with dizziness:     ?    ?Gastrointestinal    ?Blood in stool:     ? ?Vomited blood:     ?    ?Genitourinary    ?Burning when urinating:     ?Blood in urine:    ?    ?Psychiatric    ?Major depression:     ?    ?Hematologic    ?Bleeding problems:    ?Problems with blood clotting too easily:    ?    ?Skin    ?Rashes or ulcers:    ?    ?  Constitutional    ?Fever or chills:    ? ?PHYSICAL EXAM:  ? ?Vitals:  ? 11/01/21 1354  ?BP: 132/70   ?Pulse: 70  ?Resp: 20  ?Temp: 98.1 ?F (36.7 ?C)  ?SpO2: 98%  ?Weight: 300 lb (136.1 kg)  ?Height: '5\' 9"'$  (1.753 m)  ? ? ?GENERAL: The patient is a well-nourished male, in no acute distress. The vital signs are documented above. ?CARDIAC: There is a regular rate and rhythm.  ?VASCULAR: Nonpalpable pedal pulses ?PULMONARY: Nonlabored respirations ?ABDOMEN: Soft and non-tender  ?MUSCULOSKELETAL: There are no major deformities or cyanosis. ?NEUROLOGIC: No focal weakness or paresthesias are detected. ?SKIN: There are no ulcers or rashes noted. ?PSYCHIATRIC: The patient has a normal affect. ? ?STUDIES:  ? ?I reviewed the angiogram performed by Dr. Sophronia Simas which shows right femoral artery occlusion and severe calcific stenosis within the external iliac and possibly the common iliac artery on the right.  On the left there is exophytic plaque beginning in the common femoral artery extending up into the iliacs. ? ?I have reviewed his carotid duplex that does not show any significant carotid disease ? ?ASSESSMENT and PLAN  ? ?Bilateral claudication: I discussed with the patient that our options would be either an aortobifemoral bypass graft or bilateral femoral endarterectomies with retrograde iliac stenting.  He does have a cardiac history as well as COPD and liver alcoholic cirrhosis.  Therefore, I would rather not put him through a intra-abdominal surgery such as an aortobifemoral bypass.  Therefore we talked about proceeding with bilateral femoral endarterectomies and atherectomy versus lithotripsy of the iliacs and subsequent stenting.  This should alleviate all of his claudication symptoms.  I discussed the details of the procedure as well as the risks and benefits including wound complications, long-term patency of stents, and need for antiplatelet therapy.  All of his questions were answered.  I did state that I would try to get one of my partners to do 1 side so as to expedite his procedure.  I am scheduling this for  Friday, March 24. ? ? ?Annamarie Major, IV, MD, FACS ?[facility] [place] ?Tel [phone] ?Pager [phone]  ?

## 2021-11-01 NOTE — Progress Notes (Signed)
? ?[facility] [place] ? ?Patient name: Kevin Gross MRN: [id number] DOB: November 28, 1955 Sex: male ? ? ?REQUESTING PROVIDER:  ? ? Dr. Fletcher Anon ? ? ?REASON FOR CONSULT:  ?  ?claudication ? ?HISTORY OF PRESENT ILLNESS:  ? ?Kevin Gross is a 66 y.o. male, who is referred today for surgical evaluation for claudication.  The patient reports having had a cardiac catheterization many years ago that was complicated by femoral artery thrombosis.  He underwent some vascular procedure for limb salvage (this did not require an incision).  He now reports bilateral calf discomfort with short distance ambulation.  He has started using [employer] when he goes shopping.  He denies rest pain or ulceration.  Most recent ABIs were 0.6 on the right and 0.6 on the left.  He underwent angiography that suggested inflow and femoral disease.  He is referred for surgical consideration. ? ?Patient has a history of alcoholic cirrhosis.  He is treated for congestive heart failure.  He suffers from COPD secondary to chronic smoking.  He is on a statin for hypercholesterolemia. ? ? ? ?PAST MEDICAL HISTORY  ? ? ?Past Medical History:  ?Diagnosis Date  ? Alcoholic cirrhosis of liver ([place])   ? CHF (congestive heart failure) ([place])   ? Clotting disorder ([place])   ? Bilateral legs-pt states plaque build up in both legs requiring intervention  ? Complication of anesthesia   ? pt states that years ago during colonoscopy at [facility] they had to abort the procedure due to some "cardiac issue" had to see cardiologist  ? COPD (chronic obstructive pulmonary disease) ([place])   ? Hyperlipidemia   ? Hypertension   ? Pre-diabetes   ? Sleep apnea   ? uses cpap  ? ? ? ?FAMILY HISTORY  ? ?Family History  ?Problem Relation Age of Onset  ? Heart disease Mother   ? Heart disease Brother   ? ? ?SOCIAL HISTORY:  ? ?Social History  ? ?Socioeconomic History  ? Marital status: Married  ?  Spouse name: Not on file  ? Number of  children: Not on file  ? Years of education: Not on file  ? Highest education level: Not on file  ?Occupational History  ? Not on file  ?Tobacco Use  ? Smoking status: Former  ?  Packs/day: 1.00  ?  Years: 47.00  ?  Pack years: 47.00  ?  Types: Cigarettes  ?  Quit date: 2016  ?  Years since quitting: 7.1  ? Smokeless tobacco: Never  ?Vaping Use  ? Vaping Use: Never used  ?Substance and Sexual Activity  ? Alcohol use: Yes  ?  Comment: occasionally  ? Drug use: No  ? Sexual activity: Never  ?Other Topics Concern  ? Not on file  ?Social History Narrative  ? Not on file  ? ?Social Determinants of Health  ? ?Financial Resource Strain: Not on file  ?Food Insecurity: Not on file  ?Transportation Needs: Not on file  ?Physical Activity: Not on file  ?Stress: Not on file  ?Social Connections: Not on file  ?Intimate Partner Violence: Not on file  ? ? ?ALLERGIES:  ? ? ?No Known Allergies ? ?CURRENT MEDICATIONS:  ? ? ?Current Outpatient Medications  ?Medication Sig Dispense Refill  ? acetaminophen (TYLENOL) 325 MG tablet Take 2 tablets (650 mg total) by mouth every 6 (six) hours as needed for mild pain (or Fever >/= 101). 30 tablet 0  ? albuterol (PROVENTIL HFA;VENTOLIN HFA) 108 (90 Base)  MCG/ACT inhaler Inhale 1-2 puffs into the lungs every 6 (six) hours as needed for wheezing or shortness of breath.    ? allopurinol (ZYLOPRIM) 300 MG tablet Take 300 mg by mouth every morning.    ? aspirin EC 81 MG tablet Take 81 mg by mouth every morning.    ? atenolol (TENORMIN) 25 MG tablet Take 25 mg by mouth every morning.    ? chlorthalidone (HYGROTON) 25 MG tablet Take 25 mg by mouth every morning.    ? clobetasol cream (TEMOVATE) 2.68 % Apply 1 application topically 2 (two) times daily as needed (irritation).    ? ezetimibe (ZETIA) 10 MG tablet Take 1 tablet (10 mg total) by mouth daily. 90 tablet 3  ? fluticasone (FLONASE) 50 MCG/ACT nasal spray Place 2 sprays into both nostrils daily as needed for allergies or rhinitis.    ?  lisinopril (ZESTRIL) 10 MG tablet Take 10 mg by mouth every morning.    ? predniSONE (DELTASONE) 10 MG tablet Take 10-40 mg by mouth See admin instructions. At the onset of a gout flare, take 40 mg on day 1, 30 mg on day 2, 20 mg on day 3, 10 mg on day 4, then stop    ? simvastatin (ZOCOR) 20 MG tablet Take 20 mg by mouth every evening.    ? tamsulosin (FLOMAX) 0.4 MG CAPS capsule Take 0.4 mg by mouth daily after supper.    ? VITAMIN D-1000 MAX ST 25 MCG (1000 UT) tablet Take 2,000 Units by mouth daily.    ? zolpidem (AMBIEN) 10 MG tablet Take 10 mg by mouth at bedtime.    ? ?Current Facility-Administered Medications  ?Medication Dose Route Frequency Provider Last Rate Last Admin  ? sodium chloride flush (NS) 0.9 % injection 3 mL  3 mL Intravenous Q12H Wellington Hampshire, MD      ? ? ?REVIEW OF SYSTEMS:  ? ?'[X]'$  denotes positive finding, '[ ]'$  denotes negative finding ?Cardiac  Comments:  ?Chest pain or chest pressure:    ?Shortness of breath upon exertion:    ?Short of breath when lying flat:    ?Irregular heart rhythm:    ?    ?Vascular    ?Pain in calf, thigh, or hip brought on by ambulation: x   ?Pain in feet at night that wakes you up from your sleep:     ?Blood clot in your veins:    ?Leg swelling:     ?    ?Pulmonary    ?Oxygen at home:    ?Productive cough:     ?Wheezing:     ?    ?Neurologic    ?Sudden weakness in arms or legs:     ?Sudden numbness in arms or legs:     ?Sudden onset of difficulty speaking or slurred speech:    ?Temporary loss of vision in one eye:     ?Problems with dizziness:     ?    ?Gastrointestinal    ?Blood in stool:     ? ?Vomited blood:     ?    ?Genitourinary    ?Burning when urinating:     ?Blood in urine:    ?    ?Psychiatric    ?Major depression:     ?    ?Hematologic    ?Bleeding problems:    ?Problems with blood clotting too easily:    ?    ?Skin    ?Rashes or ulcers:    ?    ?  Constitutional    ?Fever or chills:    ? ?PHYSICAL EXAM:  ? ?Vitals:  ? 11/01/21 1354  ?BP: 132/70   ?Pulse: 70  ?Resp: 20  ?Temp: 98.1 ?F (36.7 ?C)  ?SpO2: 98%  ?Weight: 300 lb (136.1 kg)  ?Height: '5\' 9"'$  (1.753 m)  ? ? ?GENERAL: The patient is a well-nourished male, in no acute distress. The vital signs are documented above. ?CARDIAC: There is a regular rate and rhythm.  ?VASCULAR: Nonpalpable pedal pulses ?PULMONARY: Nonlabored respirations ?ABDOMEN: Soft and non-tender  ?MUSCULOSKELETAL: There are no major deformities or cyanosis. ?NEUROLOGIC: No focal weakness or paresthesias are detected. ?SKIN: There are no ulcers or rashes noted. ?PSYCHIATRIC: The patient has a normal affect. ? ?STUDIES:  ? ?I reviewed the angiogram performed by Dr. Sophronia Simas which shows right femoral artery occlusion and severe calcific stenosis within the external iliac and possibly the common iliac artery on the right.  On the left there is exophytic plaque beginning in the common femoral artery extending up into the iliacs. ? ?I have reviewed his carotid duplex that does not show any significant carotid disease ? ?ASSESSMENT and PLAN  ? ?Bilateral claudication: I discussed with the patient that our options would be either an aortobifemoral bypass graft or bilateral femoral endarterectomies with retrograde iliac stenting.  He does have a cardiac history as well as COPD and liver alcoholic cirrhosis.  Therefore, I would rather not put him through a intra-abdominal surgery such as an aortobifemoral bypass.  Therefore we talked about proceeding with bilateral femoral endarterectomies and atherectomy versus lithotripsy of the iliacs and subsequent stenting.  This should alleviate all of his claudication symptoms.  I discussed the details of the procedure as well as the risks and benefits including wound complications, long-term patency of stents, and need for antiplatelet therapy.  All of his questions were answered.  I did state that I would try to get one of my partners to do 1 side so as to expedite his procedure.  I am scheduling this for  Friday, March 24. ? ? ?Annamarie Major, IV, MD, FACS ?[facility] [place] ?Tel [phone] ?Pager [phone]  ?

## 2021-11-04 ENCOUNTER — Other Ambulatory Visit: Payer: Self-pay

## 2021-11-04 DIAGNOSIS — I70213 Atherosclerosis of native arteries of extremities with intermittent claudication, bilateral legs: Secondary | ICD-10-CM

## 2021-11-05 ENCOUNTER — Encounter: Payer: Self-pay | Admitting: Medical

## 2021-11-05 ENCOUNTER — Ambulatory Visit: Payer: Medicare PPO | Admitting: Medical

## 2021-11-05 ENCOUNTER — Other Ambulatory Visit: Payer: Self-pay

## 2021-11-05 VITALS — BP 100/64 | HR 69 | Ht 69.0 in | Wt 302.1 lb

## 2021-11-05 DIAGNOSIS — R0609 Other forms of dyspnea: Secondary | ICD-10-CM

## 2021-11-05 DIAGNOSIS — I739 Peripheral vascular disease, unspecified: Secondary | ICD-10-CM | POA: Diagnosis not present

## 2021-11-05 DIAGNOSIS — I25118 Atherosclerotic heart disease of native coronary artery with other forms of angina pectoris: Secondary | ICD-10-CM | POA: Diagnosis not present

## 2021-11-05 NOTE — Progress Notes (Unsigned)
Cardiology Office Note:    Date:  11/08/2021   ID:  Kevin Gross, DOB 15-Feb-1956, MRN [id number]  PCP:  Rutherford Limerick, PA  [facility] Cardiologist:  None  [facility] Electrophysiologist:  None   Referring MD: Rutherford Limerick, PA   Chief Complaint: 5 week follow-up  History of Present Illness:    Kevin Gross is a 66 y.o. male with a hx of COPD, previous tobacco use, HLD, liver cirrhosis, peripheral arterial disease, CKD, CAD, obesity, and PAD who presents for follow-up.   He reported a cardiac cath by Dr. Clayborn Bigness 20 years ago that was complicated by thrombosis of the common femoral artery. He was told at that time amputation of the lower extremity was required but the situation was salvaged by the vascular surgeon.   He underwent noninvasive vascular evaluation which showed an ABI of 0.62 on the right and 0.64 on the left.  Duplex showed possibly occluded right common femoral artery and severe stenosis in the left common femoral artery.  The iliac arteries were very difficult to visualize but had monophasic waveforms  He was seen by Dr. Fletcher Anon 09/28/21 for claudication symptoms. He underwent angiography showing inflow and femoral disease.   He was referred to [facility] for surgical evaluation seen 11/01/21. Most recent Abs showed 0.6 on the right and 0.6 on the left. Plan for b/l femoral endarterectomies and atherectomy vs lithotripsy of the iliacs and subsequent stenting.   Today, the patient reports [place] doing [place]. He denies chest pain or SOB. He has pain in his calves. No LLE, orthopnea, or pnd. He has COPD and OSA. He uses a CPAP.  Function is limited due to the leg pain. He can walk up a flight of stairs if he really tries. Seems calf pain is progressively getting worse. He is unable to walk around the store when doe grocery shopping. Patient reports possible history of CHF, I will check an echocardiogram.   Past Medical History:  Diagnosis Date   Alcoholic cirrhosis of liver  (HCC)    CHF (congestive heart failure) (HCC)    Clotting disorder (HCC)    Bilateral legs-pt states plaque build up in both legs requiring intervention   Complication of anesthesia    pt states that years ago during colonoscopy at [facility] they had to abort the procedure due to some "cardiac issue" had to see cardiologist   COPD (chronic obstructive pulmonary disease) ([place])    Hyperlipidemia    Hypertension    Pre-diabetes    Sleep apnea    uses cpap    Past Surgical History:  Procedure Laterality Date   ABDOMINAL AORTOGRAM W/LOWER EXTREMITY N/A 10/20/2021   Procedure: ABDOMINAL AORTOGRAM W/LOWER EXTREMITY;  Surgeon: Wellington Hampshire, MD;  Location: [place] [facility];  Service: Cardiovascular;  Laterality: N/A;   CARDIAC CATHETERIZATION     [facility]   COLONOSCOPY     COLONOSCOPY WITH PROPOFOL N/A 01/17/2018   Procedure: COLONOSCOPY WITH PROPOFOL;  Surgeon: Toledo, Benay Pike, MD;  Location: [facility] ENDOSCOPY;  Service: Gastroenterology;  Laterality: N/A;   ESOPHAGOGASTRODUODENOSCOPY (EGD) WITH PROPOFOL N/A 01/17/2018   Procedure: ESOPHAGOGASTRODUODENOSCOPY (EGD) WITH PROPOFOL;  Surgeon: Toledo, Benay Pike, MD;  Location: [facility] ENDOSCOPY;  Service: Gastroenterology;  Laterality: N/A;   ORIF ANKLE FRACTURE Right    PROSTATE BIOPSY N/A 04/01/2021   Procedure: PROSTATE BIOPSY Vernelle Emerald;  Surgeon: Royston Cowper, MD;  Location: [facility] ORS;  Service: Urology;  Laterality: N/A;    Current Medications: Current Meds  Medication Sig   acetaminophen (TYLENOL) 325 MG tablet Take 2 tablets (650 mg total) by mouth every 6 (six) hours as needed for mild pain (or Fever >/= 101).   albuterol (PROVENTIL HFA;VENTOLIN HFA) 108 (90 Base) MCG/ACT inhaler Inhale 1-2 puffs into the lungs every 6 (six) hours as needed for wheezing or shortness of breath.   allopurinol (ZYLOPRIM) 300 MG tablet Take 300 mg by mouth every morning.   aspirin EC 81 MG tablet Take 81 mg by mouth every morning.   atenolol (TENORMIN) 25 MG tablet  Take 25 mg by mouth every morning.   chlorthalidone (HYGROTON) 25 MG tablet Take 25 mg by mouth every morning.   clobetasol cream (TEMOVATE) 9.37 % Apply 1 application topically 2 (two) times daily as needed (irritation).   ezetimibe (ZETIA) 10 MG tablet Take 1 tablet (10 mg total) by mouth daily.   fluticasone (FLONASE) 50 MCG/ACT nasal spray Place 2 sprays into both nostrils daily as needed for allergies or rhinitis.   lisinopril (ZESTRIL) 10 MG tablet Take 10 mg by mouth every morning.   predniSONE (DELTASONE) 10 MG tablet Take 10-40 mg by mouth See admin instructions. At the onset of a gout flare, take 40 mg on day 1, 30 mg on day 2, 20 mg on day 3, 10 mg on day 4, then stop   simvastatin (ZOCOR) 20 MG tablet Take 20 mg by mouth every evening.   tamsulosin (FLOMAX) 0.4 MG CAPS capsule Take 0.4 mg by mouth daily after supper.   VITAMIN D-1000 MAX ST 25 MCG (1000 UT) tablet Take 2,000 Units by mouth daily.   zolpidem (AMBIEN) 10 MG tablet Take 10 mg by mouth at bedtime.   Current Facility-Administered Medications for the 11/05/21 encounter (Office Visit) with Kathlen Mody, Jermane Brayboy H, PA-C  Medication   sodium chloride flush (NS) 0.9 % injection 3 mL     Allergies:   Patient has no known allergies.   Social History   Socioeconomic History   Marital status: Married    Spouse name: Not on file   Number of children: Not on file   Years of education: Not on file   Highest education level: Not on file  Occupational History   Not on file  Tobacco Use   Smoking status: Former    Packs/day: 1.00    Years: 47.00    Pack years: 47.00    Types: Cigarettes    Quit date: 2016    Years since quitting: 7.2   Smokeless tobacco: Never  Vaping Use   Vaping Use: Never used  Substance and Sexual Activity   Alcohol use: Yes    Comment: occasionally   Drug use: No   Sexual activity: Never  Other Topics Concern   Not on file  Social History Narrative   Not on file   Social Determinants of Health    Financial Resource Strain: Not on file  Food Insecurity: Not on file  Transportation Needs: Not on file  Physical Activity: Not on file  Stress: Not on file  Social Connections: Not on file     Family History: The patient's family history includes Heart disease in his brother and mother.  ROS:   Please see the history of present illness.     All other systems reviewed and are negative.  EKGs/Labs/Other Studies Reviewed:    The following studies were reviewed today:  Echo ordered  EKG:  EKG is *** ordered today.  The ekg ordered today demonstrates ***  Recent Labs: 03/25/2021: ALT 14 10/12/2021: BUN 37; Creatinine, Ser 1.66; Hemoglobin 13.3; Platelets 175; Potassium 4.6; Sodium 145  Recent Lipid Panel No results found for: CHOL, TRIG, HDL, CHOLHDL, VLDL, LDLCALC, LDLDIRECT   Physical Exam:    VS:  BP 100/64 (BP Location: Left Arm, Patient Position: Sitting, Cuff Size: Large)    Pulse 69    Ht '5\' 9"'$  (1.753 m)    Wt (!) 302 lb 2 oz (137 kg)    SpO2 98%    BMI 44.62 kg/m     Wt Readings from Last 3 Encounters:  11/05/21 (!) 302 lb 2 oz (137 kg)  11/01/21 300 lb (136.1 kg)  10/20/21 296 lb (134.3 kg)     GEN:  Well nourished, well developed in no acute distress HEENT: Normal NECK: No JVD; No carotid bruits LYMPHATICS: No lymphadenopathy CARDIAC: RRR, no murmurs, rubs, gallops RESPIRATORY:  Clear to auscultation without rales, wheezing or rhonchi  ABDOMEN: Soft, non-tender, non-distended MUSCULOSKELETAL:  No edema; No deformity  SKIN: Warm and dry NEUROLOGIC:  Alert and oriented x 3 PSYCHIATRIC:  Normal affect   ASSESSMENT:    1. PAD (peripheral artery disease) ([place])   2. DOE (dyspnea on exertion)   3. Coronary artery disease of native artery of native heart with stable angina pectoris (HCC)    PLAN:    In order of problems listed above:  PAD/b/l claudication CAD Plan for lower extremity intervention as outlined above, scheduled for 11/26/21. No significant  CAD history. CT chest in 2021 showed mild 3V coronary atherosclerosis, LAD predominant.  The patient denies and anginal symptoms. He reports possible history of CHF, I will check an echo. No acute heart failure signs of symptoms. His function is limited by claudication symptoms. METS>4. RVRI class 2 risk, 6% 30 day risk of death, MI or cardiac arrest. Will check an echo, if this is normal can proceed with surgery as planned.    Disposition: Follow up in 1 month(s) with MD/APP     Signed, Heaven Meeker Ninfa Meeker, PA-C  11/08/2021 10:54 AM    [facility]

## 2021-11-05 NOTE — Patient Instructions (Signed)
Medication Instructions:  ? ?Your physician recommends that you continue on your current medications as directed. Please refer to the Current Medication list given to you today.  ? ?*If you need a refill on your cardiac medications before your next appointment, please call your pharmacy* ? ? ?Lab Work: ?None ordered ? ?If you have labs (blood work) drawn today and your tests are completely normal, you will receive your results only by: ?MyChart Message (if you have MyChart) OR ?A paper copy in the mail ?If you have any lab test that is abnormal or we need to change your treatment, we will call you to review the results. ? ? ?Testing/Procedures: ? ?Your physician has requested that you have an echocardiogram. Echocardiography is a painless test that uses sound waves to create images of your heart. It provides your doctor with information about the size and shape of your heart and how well your heart?s chambers and valves are working. This procedure takes approximately one hour. There are no restrictions for this procedure.  ? ? ?Follow-Up: ?At [facility], you and your health needs are our priority.  As part of our continuing mission to provide you with exceptional heart care, we have created designated Provider Care Teams.  These Care Teams include your primary Cardiologist (physician) and Advanced Practice Providers (APPs -  Physician Assistants and Nurse Practitioners) who all work together to provide you with the care you need, when you need it. ? ?We recommend signing up for the patient portal called "MyChart".  Sign up information is provided on this After Visit Summary.  MyChart is used to connect with patients for Virtual Visits (Telemedicine).  Patients are able to view lab/test results, encounter notes, upcoming appointments, etc.  Non-urgent messages can be sent to your provider as well.   ?To learn more about what you can do with MyChart, go to [email or web address].   ? ?Your next appointment:   ?3  month(s) ? ?The format for your next appointment:   ?In Person ? ?Provider:   ?You may see Ida Rogue, MD or one of the following Advanced Practice Providers on your designated Care Team:   ?Murray Hodgkins, NP ?Christell Faith, PA-C ?Cadence Kathlen Mody, PA-C  ? ? ?Other Instructions ?N/A ?

## 2021-11-08 ENCOUNTER — Ambulatory Visit (INDEPENDENT_AMBULATORY_CARE_PROVIDER_SITE_OTHER): Payer: Medicare PPO

## 2021-11-08 ENCOUNTER — Other Ambulatory Visit: Payer: Self-pay

## 2021-11-08 DIAGNOSIS — R0609 Other forms of dyspnea: Secondary | ICD-10-CM | POA: Diagnosis not present

## 2021-11-08 LAB — ECHOCARDIOGRAM COMPLETE
AR max vel: 2.93 cm2
AV Area VTI: 2.57 cm2
AV Area mean vel: 2.69 cm2
AV Mean grad: 3.5 mmHg
AV Peak grad: 7.5 mmHg
Ao pk vel: 1.37 m/s
S' Lateral: 4.1 cm

## 2021-11-08 MED ORDER — PERFLUTREN LIPID MICROSPHERE
1.0000 mL | INTRAVENOUS | Status: AC | PRN
  Administered 2021-11-08: 2 mL via INTRAVENOUS

## 2021-11-11 ENCOUNTER — Telehealth: Payer: Self-pay | Admitting: Emergency Medicine

## 2021-11-11 NOTE — Telephone Encounter (Signed)
Called patient to go over results. Pt's wife reports that he's still asleep and she will have him call.  ?

## 2021-11-11 NOTE — Telephone Encounter (Signed)
Called and spoke with patient. Results reviewed with patient, pt verbalized understanding,  questions (if any) answered.   ?

## 2021-11-11 NOTE — Telephone Encounter (Signed)
Patient returning call.

## 2021-11-11 NOTE — Telephone Encounter (Signed)
-----   Message from [place], PA-C sent at 11/09/2021 12:45 PM EDT ----- ?Echo showed normal pump function, mildly thickened heart. Overall reassuring ?

## 2021-11-15 ENCOUNTER — Other Ambulatory Visit (INDEPENDENT_AMBULATORY_CARE_PROVIDER_SITE_OTHER): Payer: Medicare PPO | Admitting: *Deleted

## 2021-11-15 DIAGNOSIS — I479 Paroxysmal tachycardia, unspecified: Secondary | ICD-10-CM

## 2021-11-22 NOTE — Progress Notes (Signed)
Surgical Instructions ? ? ? Your procedure is scheduled on Friday March 31st. ? Report to [facility] Main Entrance "A" at 5:30 A.M., then check in with the Admitting office. ? Call this number if you have problems the morning of surgery: ? [phone] ? ? If you have any questions prior to your surgery date call [phone]: Open Monday-Friday 8am-4pm ? ? ? Remember: ? Do not eat or drink after midnight the night before your surgery ? ?  ?Take these medicines the morning of surgery with A SIP OF WATER: ?aspirin EC 81 MG tablet ?allopurinol (ZYLOPRIM) 300 MG tablet ?atenolol (TENORMIN) 25 MG tablet ?ezetimibe (ZETIA) 10 MG tablet ? ?IF NEEDED  ?acetaminophen (TYLENOL) 325 MG tablet ?albuterol (PROVENTIL HFA;VENTOLIN HFA) 108 (90 Base) MCG/ACT inhaler - bring with you to the hospital ?fluticasone (FLONASE) 50 MCG/ACT nasal spray ?predniSONE (DELTASONE) 10 MG tablet ? ?Follow your surgeon's instructions on when to stop Aspirin.  If no instructions were given by your surgeon then you will need to call the office to get those instructions.   ? ?As of today, STOP taking any Aspirin (unless otherwise instructed by your surgeon) Aleve, Naproxen, Ibuprofen, Motrin, Advil, Goody's, BC's, all herbal medications, fish oil, and all vitamins. ? ?         ?Do not wear jewelry ?Do not wear lotions, powders, colognes, or deodorant. ?Do not shave 48 hours prior to surgery.  Men may shave face and neck. ?Do not bring valuables to the hospital. ?Do not wear nail polish, gel polish, artificial nails, or any other type of covering on natural nails (fingers and toes) ?If you have artificial nails or gel coating that need to be removed by a nail salon, please have this removed prior to surgery. Artificial nails or gel coating may interfere with anesthesia's ability to adequately monitor your vital signs. ? ?[facility] is not responsible for any belongings or valuables. .  ? ?Do NOT Smoke (Tobacco/Vaping)  24 hours prior to your  procedure ? ?If you use a CPAP at night, you may bring your mask for your overnight stay. ?  ?Contacts, glasses, hearing aids, dentures or partials may not be worn into surgery, please bring cases for these belongings ?  ?For patients admitted to the hospital, discharge time will be determined by your treatment team. ?  ?Patients discharged the day of surgery will not be allowed to drive home, and someone needs to stay with them for 24 hours. ? ? ?SURGICAL WAITING ROOM VISITATION ?Patients having surgery or a procedure in a hospital may have two support people. ?Children under the age of 42 must have an adult with them who is not the patient. ?They may stay in the waiting area during the procedure and may switch out with other visitors. If the patient needs to stay at the hospital during part of their recovery, the visitor guidelines for inpatient rooms apply. ? ?Please refer to the [place] [facility] website for the visitor guidelines for Inpatients (after your surgery is over and you are in a regular room).  ? ? ? ? ? ?Special instructions:   ? ?Oral Hygiene is also important to reduce your risk of infection.  Remember - BRUSH YOUR TEETH THE MORNING OF SURGERY WITH YOUR REGULAR TOOTHPASTE ? ? ?[place]- Preparing For Surgery ? ?Before surgery, you can play an important role. Because skin is not sterile, your skin needs to be as free of germs as possible. You can reduce the number of germs on your  skin by washing with CHG (chlorahexidine gluconate) Soap before surgery.  CHG is an antiseptic cleaner which kills germs and bonds with the skin to continue killing germs even after washing.   ? ? ?Please do not use if you have an allergy to CHG or antibacterial soaps. If your skin becomes reddened/irritated stop using the CHG.  ?Do not shave (including legs and underarms) for at least 48 hours prior to first CHG shower. It is OK to shave your face. ? ?Please follow these instructions carefully. ?  ? ? Shower the NIGHT BEFORE  SURGERY and the MORNING OF SURGERY with CHG Soap.  ? If you chose to wash your hair, wash your hair first as usual with your normal shampoo. After you shampoo, rinse your hair and body thoroughly to remove the shampoo.  Then [employer] and genitals (private parts) with your normal soap and rinse thoroughly to remove soap. ? ?After that Use CHG Soap as you would any other liquid soap. You can apply CHG directly to the skin and wash gently with a scrungie or a clean washcloth.  ? ?Apply the CHG Soap to your body ONLY FROM THE NECK DOWN.  Do not use on open wounds or open sores. Avoid contact with your eyes, ears, mouth and genitals (private parts). Wash Face and genitals (private parts)  with your normal soap.  ? ?Wash thoroughly, paying special attention to the area where your surgery will be performed. ? ?Thoroughly rinse your body with warm water from the neck down. ? ?DO NOT shower/wash with your normal soap after using and rinsing off the CHG Soap. ? ?Pat yourself dry with a CLEAN TOWEL. ? ?Wear CLEAN PAJAMAS to bed the night before surgery ? ?Place CLEAN SHEETS on your bed the night before your surgery ? ?DO NOT SLEEP WITH PETS. ? ? ?Day of Surgery: ? ?Take a shower with CHG soap. ?Wear Clean/Comfortable clothing the morning of surgery ?Do not apply any deodorants/lotions.   ?Remember to brush your teeth WITH YOUR REGULAR TOOTHPASTE. ? ? ? ?If you received a COVID test during your pre-op visit  it is requested that you wear a mask when out in public, stay away from anyone that may not be feeling well and notify your surgeon if you develop symptoms. If you have been in contact with anyone that has tested positive in the last 10 days please notify you surgeon. ? ?  ?Please read over the following fact sheets that you were given.  ? ?

## 2021-11-23 ENCOUNTER — Encounter (HOSPITAL_COMMUNITY): Payer: Self-pay

## 2021-11-23 ENCOUNTER — Other Ambulatory Visit: Payer: Self-pay

## 2021-11-23 ENCOUNTER — Inpatient Hospital Stay: Admission: RE | Admit: 2021-11-23 | Payer: Medicare PPO | Source: Ambulatory Visit

## 2021-11-23 ENCOUNTER — Encounter (HOSPITAL_COMMUNITY)
Admission: RE | Admit: 2021-11-23 | Discharge: 2021-11-23 | Disposition: A | Discharge status: Home / Self Care | Payer: Medicare PPO | Source: Ambulatory Visit | Attending: Surgery | Admitting: Surgery

## 2021-11-23 VITALS — BP 127/74 | HR 52 | Temp 98.1°F | Resp 18 | Ht 68.0 in | Wt 301.1 lb

## 2021-11-23 DIAGNOSIS — G4733 Obstructive sleep apnea (adult) (pediatric): Secondary | ICD-10-CM | POA: Insufficient documentation

## 2021-11-23 DIAGNOSIS — N1831 Chronic kidney disease, stage 3a: Secondary | ICD-10-CM | POA: Insufficient documentation

## 2021-11-23 DIAGNOSIS — Z01812 Encounter for preprocedural laboratory examination: Secondary | ICD-10-CM | POA: Insufficient documentation

## 2021-11-23 DIAGNOSIS — I70213 Atherosclerosis of native arteries of extremities with intermittent claudication, bilateral legs: Secondary | ICD-10-CM | POA: Insufficient documentation

## 2021-11-23 DIAGNOSIS — Z01818 Encounter for other preprocedural examination: Secondary | ICD-10-CM

## 2021-11-23 LAB — URINALYSIS, ROUTINE W REFLEX MICROSCOPIC
Bilirubin Urine: NEGATIVE
Glucose, UA: NEGATIVE mg/dL
Hgb urine dipstick: NEGATIVE
Ketones, ur: NEGATIVE mg/dL
Leukocytes,Ua: NEGATIVE
Nitrite: NEGATIVE
Protein, ur: 30 mg/dL — AB
Specific Gravity, Urine: 1.017 (ref 1.005–1.030)
pH: 5 (ref 5.0–8.0)

## 2021-11-23 LAB — PROTIME-INR
INR: 1 (ref 0.8–1.2)
Prothrombin Time: 13.1 seconds (ref 11.4–15.2)

## 2021-11-23 LAB — CBC
HCT: 43.3 % (ref 39.0–52.0)
Hemoglobin: 13.6 g/dL (ref 13.0–17.0)
MCH: 33.3 pg (ref 26.0–34.0)
MCHC: 31.4 g/dL (ref 30.0–36.0)
MCV: 106.1 fL — ABNORMAL HIGH (ref 80.0–100.0)
Platelets: 157 10*3/uL (ref 150–400)
RBC: 4.08 MIL/uL — ABNORMAL LOW (ref 4.22–5.81)
RDW: 14 % (ref 11.5–15.5)
WBC: 6.9 10*3/uL (ref 4.0–10.5)
nRBC: 0 % (ref 0.0–0.2)

## 2021-11-23 LAB — COMPREHENSIVE METABOLIC PANEL
ALT: 15 U/L (ref 0–44)
AST: 21 U/L (ref 15–41)
Albumin: 3.7 g/dL (ref 3.5–5.0)
Alkaline Phosphatase: 38 U/L (ref 38–126)
Anion gap: 6 (ref 5–15)
BUN: 30 mg/dL — ABNORMAL HIGH (ref 8–23)
CO2: 26 mmol/L (ref 22–32)
Calcium: 9.3 mg/dL (ref 8.9–10.3)
Chloride: 110 mmol/L (ref 98–111)
Creatinine, Ser: 1.79 mg/dL — ABNORMAL HIGH (ref 0.61–1.24)
GFR, Estimated: 42 mL/min — ABNORMAL LOW (ref >60)
Glucose, Bld: 126 mg/dL — ABNORMAL HIGH (ref 70–99)
Potassium: 4.5 mmol/L (ref 3.5–5.1)
Sodium: 142 mmol/L (ref 135–145)
Total Bilirubin: 0.8 mg/dL (ref 0.3–1.2)
Total Protein: 7 g/dL (ref 6.5–8.1)

## 2021-11-23 LAB — APTT: aPTT: 28 seconds (ref 24–36)

## 2021-11-23 LAB — SURGICAL PCR SCREEN
MRSA, PCR: NEGATIVE
Staphylococcus aureus: NEGATIVE

## 2021-11-23 NOTE — Progress Notes (Signed)
PCP - August Luz PA in [facility] ?Cardiologist - Ida Rogue, MD with [facility] in [place] ? ?Chest x-ray - Not indicated ?EKG - 11/15/21 ?Stress Test - Denies ?ECHO - 11/08/21 ?Cardiac Cath - "twenty something years ago, they felt I had congestive heart failure" ? ?Sleep Study - Yes has OSA ?CPAP - Nightly ? ?DM - Denies ? ?Aspirin Instructions: Will continue through day of surgery per [facility] protocol  ? ?Anesthesia review: Yes cardiac history ? ?Patient denies shortness of breath, fever, cough and chest pain at PAT appointment ? ? ?All instructions explained to the patient, with a verbal understanding of the material. Patient agrees to go over the instructions while at home for a better understanding.  The opportunity to ask questions was provided. ? ? ?

## 2021-11-24 NOTE — Progress Notes (Signed)
Anesthesia Chart Review: ? ?Pt seen by cardiology 11/05/21 for preop eval. Per note, "Plan for lower extremity intervention as outlined above, scheduled for 11/26/21. No significant CAD history. CT chest in 2021 showed mild 3V coronary atherosclerosis, LAD predominant.  The patient denies and anginal symptoms. He reports possible history of CHF, I will check an echo. No acute heart failure signs of symptoms. His function is limited by claudication symptoms. METS>4. RVRI class 2 risk, 6% 30 day risk of death, MI or cardiac arrest. Will check an echo, if this is normal can proceed with surgery as planned." TTE 11/08/21 showed EF 55-60%, no significant valvular abnormalities. ? ?OSA on CPAP. ? ?CKD 3a ? ?Preop labs reviewed. Creatinine elevated at 1.79 (baseline ~1.6), labs otherwise unremarkable. ? ?EKG 11/08/21: NSR with PACs. Rate 63. ? ?TTE 11/08/21: ?  1. Left ventricular ejection fraction, by estimation, is 55 to 60%. The  ?left ventricle has normal function. The left ventricle has no regional  ?wall motion abnormalities. There is mild left ventricular hypertrophy.  ?Left ventricular diastolic parameters  ?are indeterminate.  ? 2. Right ventricular systolic function is normal. The right ventricular  ?size is mildly enlarged.  ? 3. The mitral valve is normal in structure. No evidence of mitral valve  ?regurgitation.  ? 4. The aortic valve was not well visualized. Aortic valve regurgitation  ?is not visualized.  ? 5. Aortic dilatation noted. 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  ? 6. The inferior vena cava is normal in size with greater than 50%  ?respiratory variability, suggesting right atrial pressure of 3 mmHg. ? ? ?Karoline Caldwell, PA-C ?[facility]/Anesthesiology ?Phone [phone] ?11/24/2021 9:10 AM s ? ?

## 2021-11-24 NOTE — Anesthesia Preprocedure Evaluation (Addendum)
Anesthesia Evaluation  ?Patient identified by MRN, date of birth, ID band ?Patient awake ? ? ? ?Reviewed: ?Allergy & Precautions, NPO status , Patient's Chart, lab work & pertinent test results ? ?History of Anesthesia Complications ?Negative for: history of anesthetic complications ? ?Airway ?Mallampati: II ? ?TM Distance: >3 FB ?Neck ROM: Full ? ? ? Dental ? ?(+) Dental Advisory Given, Loose, Poor Dentition, Missing, Chipped ?  ?Pulmonary ?sleep apnea and Continuous Positive Airway Pressure Ventilation , COPD (no longer requires O2),  COPD inhaler, former smoker,  ?  ?breath sounds clear to auscultation ? ? ? ? ? ? Cardiovascular ?hypertension, Pt. on medications and Pt. on home beta blockers ?+ CAD  ? ?Rhythm:Regular Rate:Normal ? ?11/08/2021 ECHO: EF 55-60%, normal LVF, mild LVH, normal RVF, no significant valvular abnormalities ?  ?Neuro/Psych ?negative neurological ROS ?   ? GI/Hepatic ?negative GI ROS, (+) Cirrhosis  ?  ?  ? ,   ?Endo/Other  ?Morbid obesity ? Renal/GU ?Renal InsufficiencyRenal disease  ? ?  ?Musculoskeletal ? ? Abdominal ?(+) + obese,   ?Peds ? Hematology ?negative hematology ROS ?(+)   ?Anesthesia Other Findings ? ? Reproductive/Obstetrics ? ?  ? ? ? ? ? ? ? ? ? ? ? ? ? ?  ?  ? ? ? ? ? ? ?Anesthesia Physical ?Anesthesia Plan ? ?ASA: 4 ? ?Anesthesia Plan: General  ? ?Post-op Pain Management:   ? ?Induction: Intravenous ? ?PONV Risk Score and Plan: 2 and Ondansetron and Dexamethasone ? ?Airway Management Planned: Oral ETT and Video Laryngoscope Planned ? ?Additional Equipment: Arterial line ? ?Intra-op Plan:  ? ?Post-operative Plan: Extubation in OR ? ?Informed Consent: I have reviewed the patients History and Physical, chart, labs and discussed the procedure including the risks, benefits and alternatives for the proposed anesthesia with the patient or authorized representative who has indicated his/her understanding and acceptance.  ? ? ? ?Dental advisory  given ? ?Plan Discussed with: CRNA and Surgeon ? ?Anesthesia Plan Comments: (PAT note by Karoline Caldwell, PA-C: ?Pt seen by cardiology 11/05/21 for preop eval. Per note, "Plan for lower extremity intervention as outlined above, scheduled for 11/26/21. No significant CAD history. CT chest in 2021 showed mild 3V coronary atherosclerosis, LAD predominant. ?The patient denies and anginal symptoms. He reports possible history of CHF, I will check an echo. No acute heart failure signs of symptoms. His function is limited by claudication symptoms. METS>4. RVRI class 2 risk, 6% 30 day risk of death, MI or cardiac arrest. Will check an echo, if this is normal can proceed with surgery as planned." TTE 11/08/21 showed EF 55-60%, no significant valvular abnormalities. ? ?OSA on CPAP. ? ?CKD 3a ? ?Preop labs reviewed. Creatinine elevated at 1.79 (baseline ~1.6), labs otherwise unremarkable. ? ?EKG 11/08/21: NSR with PACs. Rate 63. ? ?TTE 11/08/21: ???1. Left ventricular ejection fraction, by estimation, is 55 to 60%. The  ?left ventricle has normal function. The left ventricle has no regional  ?wall motion abnormalities. There is mild left ventricular hypertrophy.  ?Left ventricular diastolic parameters  ?are indeterminate.  ??2. Right ventricular systolic function is normal. The right ventricular  ?size is mildly enlarged.  ??3. The mitral valve is normal in structure. No evidence of mitral valve  ?regurgitation.  ??4. The aortic valve was not well visualized. Aortic valve regurgitation  ?is not visualized.  ??5. Aortic dilatation noted. 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  ??6. The inferior vena cava is normal in size  with greater than 50%  ?respiratory variability, suggesting right atrial pressure of 3 mmHg. ? ? ?)  ? ? ?Anesthesia Quick Evaluation ? ?

## 2021-11-26 ENCOUNTER — Inpatient Hospital Stay (HOSPITAL_COMMUNITY)
Admission: RE | Admit: 2021-11-26 | Discharge: 2021-12-06 | DRG: 270 | Disposition: A | Discharge status: Home Health | Payer: Medicare PPO | Attending: Surgery | Admitting: Surgery

## 2021-11-26 ENCOUNTER — Inpatient Hospital Stay (HOSPITAL_COMMUNITY): Payer: Medicare PPO | Admitting: Physician Assistant

## 2021-11-26 ENCOUNTER — Encounter (HOSPITAL_COMMUNITY)
Admission: RE | Disposition: A | Discharge status: Home Health | Payer: Self-pay | Source: Home / Self Care | Attending: Surgery

## 2021-11-26 ENCOUNTER — Encounter (HOSPITAL_COMMUNITY): Payer: Self-pay | Admitting: Surgery

## 2021-11-26 ENCOUNTER — Other Ambulatory Visit: Payer: Self-pay

## 2021-11-26 ENCOUNTER — Inpatient Hospital Stay (HOSPITAL_COMMUNITY): Payer: Medicare PPO

## 2021-11-26 ENCOUNTER — Inpatient Hospital Stay (HOSPITAL_COMMUNITY): Payer: Medicare PPO | Admitting: Anesthesiology

## 2021-11-26 DIAGNOSIS — E7800 Pure hypercholesterolemia, unspecified: Secondary | ICD-10-CM | POA: Diagnosis present

## 2021-11-26 DIAGNOSIS — Z79899 Other long term (current) drug therapy: Secondary | ICD-10-CM

## 2021-11-26 DIAGNOSIS — D696 Thrombocytopenia, unspecified: Secondary | ICD-10-CM | POA: Diagnosis not present

## 2021-11-26 DIAGNOSIS — I130 Hypertensive heart and chronic kidney disease with heart failure and stage 1 through stage 4 chronic kidney disease, or unspecified chronic kidney disease: Secondary | ICD-10-CM | POA: Diagnosis present

## 2021-11-26 DIAGNOSIS — J9601 Acute respiratory failure with hypoxia: Secondary | ICD-10-CM | POA: Diagnosis not present

## 2021-11-26 DIAGNOSIS — Z6841 Body Mass Index (BMI) 40.0 and over, adult: Secondary | ICD-10-CM | POA: Diagnosis not present

## 2021-11-26 DIAGNOSIS — R578 Other shock: Secondary | ICD-10-CM | POA: Diagnosis not present

## 2021-11-26 DIAGNOSIS — D689 Coagulation defect, unspecified: Secondary | ICD-10-CM | POA: Diagnosis present

## 2021-11-26 DIAGNOSIS — N1831 Chronic kidney disease, stage 3a: Secondary | ICD-10-CM | POA: Diagnosis present

## 2021-11-26 DIAGNOSIS — E1165 Type 2 diabetes mellitus with hyperglycemia: Secondary | ICD-10-CM | POA: Diagnosis present

## 2021-11-26 DIAGNOSIS — Z8673 Personal history of transient ischemic attack (TIA), and cerebral infarction without residual deficits: Secondary | ICD-10-CM

## 2021-11-26 DIAGNOSIS — T8110XA Postprocedural shock unspecified, initial encounter: Secondary | ICD-10-CM | POA: Diagnosis not present

## 2021-11-26 DIAGNOSIS — D62 Acute posthemorrhagic anemia: Secondary | ICD-10-CM | POA: Diagnosis present

## 2021-11-26 DIAGNOSIS — I2510 Atherosclerotic heart disease of native coronary artery without angina pectoris: Secondary | ICD-10-CM

## 2021-11-26 DIAGNOSIS — I77810 Thoracic aortic ectasia: Secondary | ICD-10-CM | POA: Diagnosis present

## 2021-11-26 DIAGNOSIS — R571 Hypovolemic shock: Secondary | ICD-10-CM | POA: Diagnosis not present

## 2021-11-26 DIAGNOSIS — J449 Chronic obstructive pulmonary disease, unspecified: Secondary | ICD-10-CM | POA: Diagnosis not present

## 2021-11-26 DIAGNOSIS — J95821 Acute postprocedural respiratory failure: Secondary | ICD-10-CM | POA: Diagnosis not present

## 2021-11-26 DIAGNOSIS — J440 Chronic obstructive pulmonary disease with acute lower respiratory infection: Secondary | ICD-10-CM | POA: Diagnosis present

## 2021-11-26 DIAGNOSIS — N179 Acute kidney failure, unspecified: Secondary | ICD-10-CM | POA: Diagnosis present

## 2021-11-26 DIAGNOSIS — G4733 Obstructive sleep apnea (adult) (pediatric): Secondary | ICD-10-CM | POA: Diagnosis present

## 2021-11-26 DIAGNOSIS — T8089XA Other complications following infusion, transfusion and therapeutic injection, initial encounter: Secondary | ICD-10-CM | POA: Diagnosis not present

## 2021-11-26 DIAGNOSIS — I480 Paroxysmal atrial fibrillation: Secondary | ICD-10-CM

## 2021-11-26 DIAGNOSIS — I97418 Intraoperative hemorrhage and hematoma of a circulatory system organ or structure complicating other circulatory system procedure: Secondary | ICD-10-CM | POA: Diagnosis not present

## 2021-11-26 DIAGNOSIS — I4891 Unspecified atrial fibrillation: Secondary | ICD-10-CM | POA: Diagnosis not present

## 2021-11-26 DIAGNOSIS — I739 Peripheral vascular disease, unspecified: Secondary | ICD-10-CM

## 2021-11-26 DIAGNOSIS — I70219 Atherosclerosis of native arteries of extremities with intermittent claudication, unspecified extremity: Secondary | ICD-10-CM | POA: Diagnosis present

## 2021-11-26 DIAGNOSIS — I70213 Atherosclerosis of native arteries of extremities with intermittent claudication, bilateral legs: Secondary | ICD-10-CM | POA: Diagnosis present

## 2021-11-26 DIAGNOSIS — I509 Heart failure, unspecified: Secondary | ICD-10-CM | POA: Diagnosis present

## 2021-11-26 DIAGNOSIS — K7030 Alcoholic cirrhosis of liver without ascites: Secondary | ICD-10-CM | POA: Diagnosis present

## 2021-11-26 DIAGNOSIS — Z8249 Family history of ischemic heart disease and other diseases of the circulatory system: Secondary | ICD-10-CM

## 2021-11-26 DIAGNOSIS — E1122 Type 2 diabetes mellitus with diabetic chronic kidney disease: Secondary | ICD-10-CM | POA: Diagnosis present

## 2021-11-26 DIAGNOSIS — G4730 Sleep apnea, unspecified: Secondary | ICD-10-CM | POA: Diagnosis present

## 2021-11-26 DIAGNOSIS — F17200 Nicotine dependence, unspecified, uncomplicated: Secondary | ICD-10-CM | POA: Diagnosis present

## 2021-11-26 DIAGNOSIS — E1151 Type 2 diabetes mellitus with diabetic peripheral angiopathy without gangrene: Secondary | ICD-10-CM | POA: Diagnosis present

## 2021-11-26 DIAGNOSIS — Z20822 Contact with and (suspected) exposure to covid-19: Secondary | ICD-10-CM | POA: Diagnosis present

## 2021-11-26 DIAGNOSIS — Z7902 Long term (current) use of antithrombotics/antiplatelets: Secondary | ICD-10-CM

## 2021-11-26 DIAGNOSIS — E875 Hyperkalemia: Secondary | ICD-10-CM | POA: Diagnosis not present

## 2021-11-26 DIAGNOSIS — I10 Essential (primary) hypertension: Secondary | ICD-10-CM | POA: Diagnosis not present

## 2021-11-26 DIAGNOSIS — Z7952 Long term (current) use of systemic steroids: Secondary | ICD-10-CM

## 2021-11-26 DIAGNOSIS — Z7982 Long term (current) use of aspirin: Secondary | ICD-10-CM

## 2021-11-26 HISTORY — PX: ENDARTERECTOMY FEMORAL: SHX5804

## 2021-11-26 HISTORY — PX: ANGIOPLASTY: SHX39

## 2021-11-26 HISTORY — PX: INSERTION OF ILIAC STENT: SHX6256

## 2021-11-26 HISTORY — PX: VEIN HARVEST: SHX6363

## 2021-11-26 LAB — COMPREHENSIVE METABOLIC PANEL
ALT: 11 U/L (ref 0–44)
AST: 21 U/L (ref 15–41)
Albumin: 2.6 g/dL — ABNORMAL LOW (ref 3.5–5.0)
Alkaline Phosphatase: 24 U/L — ABNORMAL LOW (ref 38–126)
Anion gap: 8 (ref 5–15)
BUN: 27 mg/dL — ABNORMAL HIGH (ref 8–23)
CO2: 21 mmol/L — ABNORMAL LOW (ref 22–32)
Calcium: 6.2 mg/dL — CL (ref 8.9–10.3)
Chloride: 114 mmol/L — ABNORMAL HIGH (ref 98–111)
Creatinine, Ser: 2.11 mg/dL — ABNORMAL HIGH (ref 0.61–1.24)
GFR, Estimated: 34 mL/min — ABNORMAL LOW (ref >60)
Glucose, Bld: 285 mg/dL — ABNORMAL HIGH (ref 70–99)
Potassium: 7 mmol/L (ref 3.5–5.1)
Sodium: 143 mmol/L (ref 135–145)
Total Bilirubin: 1.7 mg/dL — ABNORMAL HIGH (ref 0.3–1.2)
Total Protein: 4.4 g/dL — ABNORMAL LOW (ref 6.5–8.1)

## 2021-11-26 LAB — PREPARE RBC (CROSSMATCH)

## 2021-11-26 LAB — LACTIC ACID, PLASMA
Lactic Acid, Venous: 5.3 mmol/L (ref 0.5–1.9)
Lactic Acid, Venous: 4.6 mmol/L (ref 0.5–1.9)

## 2021-11-26 LAB — CBC
HCT: 39.1 % (ref 39.0–52.0)
HCT: 41.3 % (ref 39.0–52.0)
Hemoglobin: 13.3 g/dL (ref 13.0–17.0)
Hemoglobin: 14.2 g/dL (ref 13.0–17.0)
MCH: 30.4 pg (ref 26.0–34.0)
MCH: 30.1 pg (ref 26.0–34.0)
MCHC: 34 g/dL (ref 30.0–36.0)
MCHC: 34.4 g/dL (ref 30.0–36.0)
MCV: 89.3 fL (ref 80.0–100.0)
MCV: 87.5 fL (ref 80.0–100.0)
Platelets: 142 10*3/uL — ABNORMAL LOW (ref 150–400)
Platelets: 143 10*3/uL — ABNORMAL LOW (ref 150–400)
RBC: 4.38 MIL/uL (ref 4.22–5.81)
RBC: 4.72 MIL/uL (ref 4.22–5.81)
RDW: 21 % — ABNORMAL HIGH (ref 11.5–15.5)
RDW: 20.5 % — ABNORMAL HIGH (ref 11.5–15.5)
WBC: 19.9 10*3/uL — ABNORMAL HIGH (ref 4.0–10.5)
WBC: 24.5 10*3/uL — ABNORMAL HIGH (ref 4.0–10.5)
nRBC: 0 % (ref 0.0–0.2)
nRBC: 0 % (ref 0.0–0.2)

## 2021-11-26 LAB — POCT I-STAT 7, (LYTES, BLD GAS, ICA,H+H)
Acid-base deficit: 9 mmol/L — ABNORMAL HIGH (ref 0.0–2.0)
Bicarbonate: 18.8 mmol/L — ABNORMAL LOW (ref 20.0–28.0)
Calcium, Ion: 0.87 mmol/L — CL (ref 1.15–1.40)
HCT: 39 % (ref 39.0–52.0)
Hemoglobin: 13.3 g/dL (ref 13.0–17.0)
O2 Saturation: 99 %
Patient temperature: 37.1
Potassium: 6.9 mmol/L (ref 3.5–5.1)
Sodium: 142 mmol/L (ref 135–145)
TCO2: 20 mmol/L — ABNORMAL LOW (ref 22–32)
pCO2 arterial: 45.9 mmHg (ref 32–48)
pH, Arterial: 7.22 — ABNORMAL LOW (ref 7.35–7.45)
pO2, Arterial: 139 mmHg — ABNORMAL HIGH (ref 83–108)

## 2021-11-26 LAB — POTASSIUM: Potassium: 6.1 mmol/L — ABNORMAL HIGH (ref 3.5–5.1)

## 2021-11-26 LAB — GLUCOSE, CAPILLARY
Glucose-Capillary: 231 mg/dL — ABNORMAL HIGH (ref 70–99)
Glucose-Capillary: 253 mg/dL — ABNORMAL HIGH (ref 70–99)
Glucose-Capillary: 262 mg/dL — ABNORMAL HIGH (ref 70–99)
Glucose-Capillary: 269 mg/dL — ABNORMAL HIGH (ref 70–99)
Glucose-Capillary: 88 mg/dL (ref 70–99)

## 2021-11-26 LAB — TROPONIN I (HIGH SENSITIVITY)
Troponin I (High Sensitivity): 14 ng/L (ref <18)
Troponin I (High Sensitivity): 18 ng/L — ABNORMAL HIGH (ref <18)

## 2021-11-26 LAB — PHOSPHORUS: Phosphorus: 4.1 mg/dL (ref 2.5–4.6)

## 2021-11-26 LAB — CK: Total CK: 173 U/L (ref 49–397)

## 2021-11-26 LAB — MAGNESIUM: Magnesium: 1.3 mg/dL — ABNORMAL LOW (ref 1.7–2.4)

## 2021-11-26 LAB — ABO/RH: ABO/RH(D): A POS

## 2021-11-26 SURGERY — ENDARTERECTOMY, FEMORAL
Anesthesia: General | Site: Groin | Laterality: Left

## 2021-11-26 MED ORDER — ROCURONIUM BROMIDE 10 MG/ML (PF) SYRINGE
PREFILLED_SYRINGE | INTRAVENOUS | Status: AC
  Filled 2021-11-26: qty 10

## 2021-11-26 MED ORDER — SUCCINYLCHOLINE CHLORIDE 200 MG/10ML IV SOSY
PREFILLED_SYRINGE | INTRAVENOUS | Status: DC | PRN
  Administered 2021-11-26: 200 mg via INTRAVENOUS

## 2021-11-26 MED ORDER — HEPARIN SODIUM (PORCINE) 1000 UNIT/ML IJ SOLN
INTRAMUSCULAR | Status: AC
  Filled 2021-11-26: qty 10

## 2021-11-26 MED ORDER — PROPOFOL 10 MG/ML IV BOLUS
INTRAVENOUS | Status: DC | PRN
Start: 2021-11-26 — End: 2021-11-26
  Administered 2021-11-26: 100 mg via INTRAVENOUS

## 2021-11-26 MED ORDER — DOPAMINE-DEXTROSE 3.2-5 MG/ML-% IV SOLN
INTRAVENOUS | Status: DC | PRN
  Administered 2021-11-26: 5 ug/kg/min via INTRAVENOUS

## 2021-11-26 MED ORDER — VASOPRESSIN 20 UNITS/100 ML INFUSION FOR SHOCK
INTRAVENOUS | Status: AC
Start: 2021-11-26 — End: 2021-11-26
  Administered 2021-11-26: 0.03 [IU]/min via INTRAVENOUS
  Filled 2021-11-26: qty 100

## 2021-11-26 MED ORDER — CALCIUM GLUCONATE-NACL 2-0.675 GM/100ML-% IV SOLN
2.0000 g | Freq: Once | INTRAVENOUS | Status: AC
  Administered 2021-11-26: 2000 mg via INTRAVENOUS
  Filled 2021-11-26: qty 100

## 2021-11-26 MED ORDER — PHENYLEPHRINE HCL-NACL 20-0.9 MG/250ML-% IV SOLN
INTRAVENOUS | Status: DC | PRN
  Administered 2021-11-26: 25 ug/min via INTRAVENOUS

## 2021-11-26 MED ORDER — HEPARIN SODIUM (PORCINE) 1000 UNIT/ML IJ SOLN
INTRAMUSCULAR | Status: DC | PRN
  Administered 2021-11-26: 2000 [IU] via INTRAVENOUS
  Administered 2021-11-26: 3000 [IU] via INTRAVENOUS
  Administered 2021-11-26: 10000 [IU] via INTRAVENOUS
  Administered 2021-11-26: 2000 [IU] via INTRAVENOUS
  Administered 2021-11-26 (×2): 3000 [IU] via INTRAVENOUS
  Administered 2021-11-26: 5000 [IU] via INTRAVENOUS
  Administered 2021-11-26: 3000 [IU] via INTRAVENOUS

## 2021-11-26 MED ORDER — CHLORHEXIDINE GLUCONATE 0.12% ORAL RINSE (MEDLINE KIT)
15.0000 mL | Freq: Two times a day (BID) | OROMUCOSAL | Status: DC
  Administered 2021-11-26 – 2021-11-28 (×4): 15 mL via OROMUCOSAL

## 2021-11-26 MED ORDER — EPHEDRINE SULFATE-NACL 50-0.9 MG/10ML-% IV SOSY
PREFILLED_SYRINGE | INTRAVENOUS | Status: DC | PRN
  Administered 2021-11-26: 10 mg via INTRAVENOUS
  Administered 2021-11-26: 5 mg via INTRAVENOUS
  Administered 2021-11-26: 10 mg via INTRAVENOUS

## 2021-11-26 MED ORDER — FENTANYL CITRATE (PF) 250 MCG/5ML IJ SOLN
INTRAMUSCULAR | Status: DC | PRN
  Administered 2021-11-26: 250 ug via INTRAVENOUS

## 2021-11-26 MED ORDER — ORAL CARE MOUTH RINSE
15.0000 mL | OROMUCOSAL | Status: DC
  Administered 2021-11-26 – 2021-11-28 (×17): 15 mL via OROMUCOSAL

## 2021-11-26 MED ORDER — ORAL CARE MOUTH RINSE: 15.0000 mL | Freq: Once | OROMUCOSAL | Status: AC

## 2021-11-26 MED ORDER — DEXMEDETOMIDINE HCL IN NACL 400 MCG/100ML IV SOLN
0.4000 ug/kg/h | INTRAVENOUS | Status: DC
  Administered 2021-11-26: 1 ug/kg/h via INTRAVENOUS
  Filled 2021-11-26: qty 200

## 2021-11-26 MED ORDER — ACETAMINOPHEN 325 MG PO TABS
325.0000 mg | ORAL_TABLET | ORAL | Status: DC | PRN
  Administered 2021-12-05 – 2021-12-06 (×2): 650 mg via ORAL
  Filled 2021-11-26 (×2): qty 2

## 2021-11-26 MED ORDER — PANTOPRAZOLE SODIUM 40 MG PO TBEC
40.0000 mg | DELAYED_RELEASE_TABLET | Freq: Every day | ORAL | Status: DC
  Administered 2021-11-29 – 2021-12-03 (×5): 40 mg via ORAL
  Filled 2021-11-26 (×5): qty 1

## 2021-11-26 MED ORDER — NOREPINEPHRINE 16 MG/250ML-% IV SOLN
0.0000 ug/min | INTRAVENOUS | Status: DC
  Administered 2021-11-26: 22 ug/min via INTRAVENOUS
  Administered 2021-11-27: 20 ug/min via INTRAVENOUS
  Administered 2021-11-27: 8 ug/min via INTRAVENOUS
  Filled 2021-11-26 (×4): qty 250

## 2021-11-26 MED ORDER — LABETALOL HCL 5 MG/ML IV SOLN
10.0000 mg | INTRAVENOUS | Status: DC | PRN
  Administered 2021-11-28: 10 mg via INTRAVENOUS
  Filled 2021-11-26: qty 4

## 2021-11-26 MED ORDER — VITAL HIGH PROTEIN PO LIQD
1000.0000 mL | ORAL | Status: DC
  Administered 2021-11-27: 1000 mL

## 2021-11-26 MED ORDER — NOREPINEPHRINE 4 MG/250ML-% IV SOLN
0.0000 ug/min | INTRAVENOUS | Status: DC
  Filled 2021-11-26: qty 250

## 2021-11-26 MED ORDER — NOREPINEPHRINE 4 MG/250ML-% IV SOLN
0.0000 ug/min | INTRAVENOUS | Status: DC
  Administered 2021-11-26: 22 ug/min via INTRAVENOUS
  Filled 2021-11-26 (×2): qty 250

## 2021-11-26 MED ORDER — ZOLPIDEM TARTRATE 5 MG PO TABS
10.0000 mg | ORAL_TABLET | Freq: Every day | ORAL | Status: DC
  Administered 2021-11-26 – 2021-12-05 (×9): 10 mg via ORAL
  Filled 2021-11-26 (×10): qty 2

## 2021-11-26 MED ORDER — PROPOFOL 10 MG/ML IV BOLUS
INTRAVENOUS | Status: AC
  Filled 2021-11-26: qty 20

## 2021-11-26 MED ORDER — SODIUM CHLORIDE 0.9 % IV SOLN: 500.0000 mL | Freq: Once | INTRAVENOUS | Status: DC | PRN

## 2021-11-26 MED ORDER — CEFAZOLIN SODIUM 1 G IJ SOLR
INTRAMUSCULAR | Status: AC
  Filled 2021-11-26: qty 20

## 2021-11-26 MED ORDER — CHLORHEXIDINE GLUCONATE CLOTH 2 % EX PADS
6.0000 | MEDICATED_PAD | Freq: Every day | CUTANEOUS | Status: DC
  Administered 2021-11-26 – 2021-12-02 (×6): 6 via TOPICAL

## 2021-11-26 MED ORDER — VASOPRESSIN 20 UNITS/100 ML INFUSION FOR SHOCK
INTRAVENOUS | Status: DC | PRN
  Administered 2021-11-26: .03 [IU]/min via INTRAVENOUS

## 2021-11-26 MED ORDER — SODIUM ZIRCONIUM CYCLOSILICATE 10 G PO PACK
10.0000 g | PACK | Freq: Every day | ORAL | Status: DC
  Administered 2021-11-26: 10 g
  Filled 2021-11-26 (×2): qty 1

## 2021-11-26 MED ORDER — VASOPRESSIN 20 UNIT/ML IV SOLN
INTRAVENOUS | Status: AC
  Filled 2021-11-26: qty 1

## 2021-11-26 MED ORDER — DEXAMETHASONE SODIUM PHOSPHATE 10 MG/ML IJ SOLN
INTRAMUSCULAR | Status: DC | PRN
  Administered 2021-11-26: 10 mg via INTRAVENOUS

## 2021-11-26 MED ORDER — INSULIN ASPART 100 UNIT/ML IV SOLN
10.0000 [IU] | Freq: Once | INTRAVENOUS | Status: AC
  Administered 2021-11-26: 10 [IU] via INTRAVENOUS

## 2021-11-26 MED ORDER — VASOPRESSIN 20 UNIT/ML IV SOLN
INTRAVENOUS | Status: DC | PRN
  Administered 2021-11-26 (×13): 1 [IU] via INTRAVENOUS

## 2021-11-26 MED ORDER — SIMVASTATIN 20 MG PO TABS
20.0000 mg | ORAL_TABLET | Freq: Every evening | ORAL | Status: DC
  Administered 2021-11-27 – 2021-12-05 (×9): 20 mg via ORAL
  Filled 2021-11-26 (×9): qty 1

## 2021-11-26 MED ORDER — PROPOFOL 1000 MG/100ML IV EMUL
0.0000 ug/kg/min | INTRAVENOUS | Status: DC
  Administered 2021-11-26: 10 ug/kg/min via INTRAVENOUS
  Administered 2021-11-27: 25 ug/kg/min via INTRAVENOUS
  Administered 2021-11-27: 10 ug/kg/min via INTRAVENOUS
  Administered 2021-11-27: 20 ug/kg/min via INTRAVENOUS
  Administered 2021-11-27 – 2021-11-28 (×3): 25 ug/kg/min via INTRAVENOUS
  Filled 2021-11-26: qty 200
  Filled 2021-11-26 (×5): qty 100

## 2021-11-26 MED ORDER — ONDANSETRON HCL 4 MG/2ML IJ SOLN: 4.0000 mg | Freq: Four times a day (QID) | INTRAMUSCULAR | Status: DC | PRN

## 2021-11-26 MED ORDER — DOCUSATE SODIUM 100 MG PO CAPS
100.0000 mg | ORAL_CAPSULE | Freq: Every day | ORAL | Status: DC
  Administered 2021-11-29 – 2021-12-05 (×4): 100 mg via ORAL
  Filled 2021-11-26 (×5): qty 1

## 2021-11-26 MED ORDER — SODIUM BICARBONATE 8.4 % IV SOLN
INTRAVENOUS | Status: DC | PRN
  Administered 2021-11-26 (×5): 50 meq via INTRAVENOUS

## 2021-11-26 MED ORDER — SODIUM BICARBONATE 8.4 % IV SOLN
50.0000 meq | Freq: Once | INTRAVENOUS | Status: AC
  Administered 2021-11-26: 50 meq via INTRAVENOUS
  Filled 2021-11-26: qty 50

## 2021-11-26 MED ORDER — DEXAMETHASONE SODIUM PHOSPHATE 10 MG/ML IJ SOLN
INTRAMUSCULAR | Status: AC
  Filled 2021-11-26: qty 1

## 2021-11-26 MED ORDER — CHLORHEXIDINE GLUCONATE CLOTH 2 % EX PADS: 6.0000 | MEDICATED_PAD | Freq: Once | CUTANEOUS | Status: DC

## 2021-11-26 MED ORDER — HEPARIN 6000 UNIT IRRIGATION SOLUTION
Status: DC | PRN
  Administered 2021-11-26 (×2): 1

## 2021-11-26 MED ORDER — GUAIFENESIN-DM 100-10 MG/5ML PO SYRP: 15.0000 mL | ORAL_SOLUTION | ORAL | Status: DC | PRN

## 2021-11-26 MED ORDER — SODIUM CHLORIDE 0.9% IV SOLUTION: Freq: Once | INTRAVENOUS | Status: DC

## 2021-11-26 MED ORDER — EZETIMIBE 10 MG PO TABS
10.0000 mg | ORAL_TABLET | Freq: Every day | ORAL | Status: DC
  Administered 2021-11-27 – 2021-12-06 (×10): 10 mg via ORAL
  Filled 2021-11-26 (×11): qty 1

## 2021-11-26 MED ORDER — ALUM & MAG HYDROXIDE-SIMETH 200-200-20 MG/5ML PO SUSP: 15.0000 mL | ORAL | Status: DC | PRN

## 2021-11-26 MED ORDER — SODIUM ZIRCONIUM CYCLOSILICATE 10 G PO PACK
10.0000 g | PACK | Freq: Once | ORAL | Status: AC
  Administered 2021-11-27: 10 g
  Filled 2021-11-26 (×2): qty 1

## 2021-11-26 MED ORDER — MAGNESIUM SULFATE 2 GM/50ML IV SOLN
2.0000 g | Freq: Once | INTRAVENOUS | Status: AC
  Administered 2021-11-27: 2 g via INTRAVENOUS
  Filled 2021-11-26: qty 50

## 2021-11-26 MED ORDER — FENTANYL CITRATE (PF) 250 MCG/5ML IJ SOLN
INTRAMUSCULAR | Status: AC
  Filled 2021-11-26: qty 5

## 2021-11-26 MED ORDER — PHENYLEPHRINE 40 MCG/ML (10ML) SYRINGE FOR IV PUSH (FOR BLOOD PRESSURE SUPPORT)
PREFILLED_SYRINGE | INTRAVENOUS | Status: AC
  Filled 2021-11-26: qty 10

## 2021-11-26 MED ORDER — PHENYLEPHRINE 40 MCG/ML (10ML) SYRINGE FOR IV PUSH (FOR BLOOD PRESSURE SUPPORT)
PREFILLED_SYRINGE | INTRAVENOUS | Status: DC | PRN
  Administered 2021-11-26: 160 ug via INTRAVENOUS
  Administered 2021-11-26: 80 ug via INTRAVENOUS
  Administered 2021-11-26: 160 ug via INTRAVENOUS
  Administered 2021-11-26 (×2): 80 ug via INTRAVENOUS

## 2021-11-26 MED ORDER — ALLOPURINOL 300 MG PO TABS
300.0000 mg | ORAL_TABLET | ORAL | Status: DC
  Administered 2021-11-27 – 2021-12-06 (×10): 300 mg via ORAL
  Filled 2021-11-26 (×10): qty 1

## 2021-11-26 MED ORDER — POLYETHYLENE GLYCOL 3350 17 G PO PACK
17.0000 g | PACK | Freq: Every day | ORAL | Status: DC
  Administered 2021-11-27 – 2021-11-29 (×3): 17 g
  Filled 2021-11-26 (×3): qty 1

## 2021-11-26 MED ORDER — ONDANSETRON HCL 4 MG/2ML IJ SOLN
INTRAMUSCULAR | Status: AC
  Filled 2021-11-26: qty 2

## 2021-11-26 MED ORDER — ALBUTEROL SULFATE (2.5 MG/3ML) 0.083% IN NEBU
2.5000 mg | INHALATION_SOLUTION | RESPIRATORY_TRACT | Status: DC | PRN
Start: 2021-11-26 — End: 2021-12-06

## 2021-11-26 MED ORDER — LACTATED RINGERS IV SOLN: INTRAVENOUS | Status: DC

## 2021-11-26 MED ORDER — INSULIN ASPART 100 UNIT/ML IV SOLN
5.0000 [IU] | Freq: Once | INTRAVENOUS | Status: AC
  Administered 2021-11-26: 5 [IU] via INTRAVENOUS

## 2021-11-26 MED ORDER — MIDAZOLAM HCL 2 MG/2ML IJ SOLN
INTRAMUSCULAR | Status: AC
  Filled 2021-11-26: qty 2

## 2021-11-26 MED ORDER — DEXMEDETOMIDINE HCL IN NACL 400 MCG/100ML IV SOLN
0.4000 ug/kg/h | INTRAVENOUS | Status: DC
  Filled 2021-11-26: qty 100

## 2021-11-26 MED ORDER — SODIUM POLYSTYRENE SULFONATE 15 GM/60ML PO SUSP
30.0000 g | Freq: Once | ORAL | Status: DC
  Filled 2021-11-26: qty 120

## 2021-11-26 MED ORDER — ALBUTEROL SULFATE HFA 108 (90 BASE) MCG/ACT IN AERS: 1.0000 | INHALATION_SPRAY | Freq: Four times a day (QID) | RESPIRATORY_TRACT | Status: DC | PRN

## 2021-11-26 MED ORDER — ALBUMIN HUMAN 5 % IV SOLN: INTRAVENOUS | Status: DC | PRN

## 2021-11-26 MED ORDER — PHENOL 1.4 % MT LIQD: 1.0000 | OROMUCOSAL | Status: DC | PRN

## 2021-11-26 MED ORDER — DOCUSATE SODIUM 50 MG/5ML PO LIQD
100.0000 mg | Freq: Two times a day (BID) | ORAL | Status: DC
  Administered 2021-11-26 – 2021-11-28 (×4): 100 mg
  Filled 2021-11-26 (×4): qty 10

## 2021-11-26 MED ORDER — FENTANYL CITRATE PF 50 MCG/ML IJ SOSY
25.0000 ug | PREFILLED_SYRINGE | INTRAMUSCULAR | Status: DC | PRN
  Administered 2021-11-27: 100 ug via INTRAVENOUS

## 2021-11-26 MED ORDER — SODIUM CHLORIDE 0.9 % IV SOLN: INTRAVENOUS | Status: DC | PRN

## 2021-11-26 MED ORDER — CEFAZOLIN IN SODIUM CHLORIDE 3-0.9 GM/100ML-% IV SOLN
3.0000 g | INTRAVENOUS | Status: AC
  Administered 2021-11-26: 2 g via INTRAVENOUS
  Administered 2021-11-26: 3 g via INTRAVENOUS
  Filled 2021-11-26: qty 100

## 2021-11-26 MED ORDER — MAGNESIUM SULFATE 2 GM/50ML IV SOLN
2.0000 g | Freq: Every day | INTRAVENOUS | Status: AC | PRN
  Administered 2021-11-26: 2 g via INTRAVENOUS
  Filled 2021-11-26: qty 50

## 2021-11-26 MED ORDER — LIDOCAINE 2% (20 MG/ML) 5 ML SYRINGE
INTRAMUSCULAR | Status: AC
  Filled 2021-11-26: qty 5

## 2021-11-26 MED ORDER — SODIUM BICARBONATE 8.4 % IV SOLN
50.0000 meq | Freq: Once | INTRAVENOUS | Status: AC
  Administered 2021-11-26: 50 meq via INTRAVENOUS

## 2021-11-26 MED ORDER — VASOPRESSIN 20 UNITS/100 ML INFUSION FOR SHOCK
0.0000 [IU]/min | INTRAVENOUS | Status: DC
  Filled 2021-11-26: qty 100

## 2021-11-26 MED ORDER — LISINOPRIL 10 MG PO TABS: 10.0000 mg | ORAL_TABLET | ORAL | Status: DC

## 2021-11-26 MED ORDER — TAMSULOSIN HCL 0.4 MG PO CAPS
0.4000 mg | ORAL_CAPSULE | Freq: Every day | ORAL | Status: DC
Start: 2021-11-26 — End: 2021-11-26

## 2021-11-26 MED ORDER — SUCCINYLCHOLINE CHLORIDE 200 MG/10ML IV SOSY
PREFILLED_SYRINGE | INTRAVENOUS | Status: AC
  Filled 2021-11-26: qty 10

## 2021-11-26 MED ORDER — EPHEDRINE 5 MG/ML INJ
INTRAVENOUS | Status: AC
  Filled 2021-11-26: qty 5

## 2021-11-26 MED ORDER — 0.9 % SODIUM CHLORIDE (POUR BTL) OPTIME
TOPICAL | Status: DC | PRN
  Administered 2021-11-26: 1000 mL

## 2021-11-26 MED ORDER — PHENTOLAMINE MESYLATE 5 MG IJ SOLR
5.0000 mg | Freq: Once | INTRAMUSCULAR | Status: AC
  Administered 2021-11-26: 5 mg via SUBCUTANEOUS
  Filled 2021-11-26: qty 5

## 2021-11-26 MED ORDER — LIDOCAINE 2% (20 MG/ML) 5 ML SYRINGE
INTRAMUSCULAR | Status: DC | PRN
  Administered 2021-11-26: 20 mg via INTRAVENOUS

## 2021-11-26 MED ORDER — IODIXANOL 320 MG/ML IV SOLN
INTRAVENOUS | Status: DC | PRN
  Administered 2021-11-26: 100 mL via INTRA_ARTERIAL

## 2021-11-26 MED ORDER — ALBUTEROL SULFATE (2.5 MG/3ML) 0.083% IN NEBU: 2.5000 mg | INHALATION_SOLUTION | Freq: Four times a day (QID) | RESPIRATORY_TRACT | Status: DC | PRN

## 2021-11-26 MED ORDER — ASPIRIN EC 81 MG PO TBEC
81.0000 mg | DELAYED_RELEASE_TABLET | ORAL | Status: DC
  Administered 2021-11-27 – 2021-12-06 (×10): 81 mg via ORAL
  Filled 2021-11-26 (×10): qty 1

## 2021-11-26 MED ORDER — ATENOLOL 25 MG PO TABS
25.0000 mg | ORAL_TABLET | ORAL | Status: DC
  Filled 2021-11-26: qty 1

## 2021-11-26 MED ORDER — ACETAMINOPHEN 650 MG RE SUPP
325.0000 mg | RECTAL | Status: DC | PRN
  Filled 2021-11-26: qty 2

## 2021-11-26 MED ORDER — MORPHINE SULFATE (PF) 2 MG/ML IV SOLN
2.0000 mg | INTRAVENOUS | Status: DC | PRN
  Administered 2021-12-05: 4 mg via INTRAVENOUS
  Filled 2021-11-26: qty 2

## 2021-11-26 MED ORDER — METOPROLOL TARTRATE 5 MG/5ML IV SOLN: 2.0000 mg | INTRAVENOUS | Status: DC | PRN

## 2021-11-26 MED ORDER — SODIUM CHLORIDE 0.9 % IV SOLN: INTRAVENOUS | Status: DC

## 2021-11-26 MED ORDER — FENTANYL 2500MCG IN NS 250ML (10MCG/ML) PREMIX INFUSION
0.0000 ug/h | INTRAVENOUS | Status: DC
  Administered 2021-11-26: 25 ug/h via INTRAVENOUS
  Administered 2021-11-27: 200 ug/h via INTRAVENOUS
  Filled 2021-11-26 (×2): qty 250

## 2021-11-26 MED ORDER — CHLORTHALIDONE 25 MG PO TABS
25.0000 mg | ORAL_TABLET | ORAL | Status: DC
  Filled 2021-11-26: qty 1

## 2021-11-26 MED ORDER — SODIUM CHLORIDE 0.9% IV SOLUTION: Freq: Once | INTRAVENOUS | Status: AC

## 2021-11-26 MED ORDER — CEFAZOLIN SODIUM-DEXTROSE 2-4 GM/100ML-% IV SOLN
2.0000 g | Freq: Three times a day (TID) | INTRAVENOUS | Status: AC
  Administered 2021-11-26 – 2021-11-27 (×2): 2 g via INTRAVENOUS
  Filled 2021-11-26 (×2): qty 100

## 2021-11-26 MED ORDER — VASOPRESSIN 20 UNITS/100 ML INFUSION FOR SHOCK
0.0000 [IU]/min | INTRAVENOUS | Status: DC
  Administered 2021-11-27: 0.04 [IU]/min via INTRAVENOUS
  Administered 2021-11-27: 0.03 [IU]/min via INTRAVENOUS
  Filled 2021-11-26 (×3): qty 100

## 2021-11-26 MED ORDER — POTASSIUM CHLORIDE CRYS ER 20 MEQ PO TBCR: 20.0000 meq | EXTENDED_RELEASE_TABLET | Freq: Every day | ORAL | Status: DC | PRN

## 2021-11-26 MED ORDER — CHLORHEXIDINE GLUCONATE 0.12 % MT SOLN
15.0000 mL | Freq: Once | OROMUCOSAL | Status: AC
  Administered 2021-11-26: 15 mL via OROMUCOSAL
  Filled 2021-11-26: qty 15

## 2021-11-26 MED ORDER — DEXMEDETOMIDINE HCL IN NACL 200 MCG/50ML IV SOLN
INTRAVENOUS | Status: DC | PRN
  Administered 2021-11-26: .7 ug/kg/h via INTRAVENOUS

## 2021-11-26 MED ORDER — OXYCODONE-ACETAMINOPHEN 5-325 MG PO TABS
1.0000 | ORAL_TABLET | ORAL | Status: DC | PRN
  Administered 2021-11-28 – 2021-12-04 (×10): 2 via ORAL
  Administered 2021-12-05: 1 via ORAL
  Administered 2021-12-05 – 2021-12-06 (×2): 2 via ORAL
  Filled 2021-11-26 (×2): qty 2
  Filled 2021-11-26: qty 1
  Filled 2021-11-26 (×10): qty 2

## 2021-11-26 MED ORDER — HYDRALAZINE HCL 20 MG/ML IJ SOLN: 5.0000 mg | INTRAMUSCULAR | Status: DC | PRN

## 2021-11-26 MED ORDER — PANTOPRAZOLE 2 MG/ML SUSPENSION
40.0000 mg | Freq: Every day | ORAL | Status: DC
  Administered 2021-11-26 – 2021-11-28 (×3): 40 mg
  Filled 2021-11-26 (×3): qty 20

## 2021-11-26 MED ORDER — PROTAMINE SULFATE 10 MG/ML IV SOLN
INTRAVENOUS | Status: AC
  Filled 2021-11-26: qty 5

## 2021-11-26 MED ORDER — PROSOURCE TF PO LIQD
45.0000 mL | Freq: Two times a day (BID) | ORAL | Status: DC
  Administered 2021-11-26 – 2021-11-27 (×2): 45 mL
  Filled 2021-11-26 (×2): qty 45

## 2021-11-26 MED ORDER — NITROGLYCERIN 2 % TD OINT
1.0000 [in_us] | TOPICAL_OINTMENT | Freq: Three times a day (TID) | TRANSDERMAL | Status: DC
  Filled 2021-11-26: qty 30

## 2021-11-26 MED ORDER — PANTOPRAZOLE 2 MG/ML SUSPENSION: 40.0000 mg | Freq: Every day | ORAL | Status: DC

## 2021-11-26 MED ORDER — PROTAMINE SULFATE 10 MG/ML IV SOLN
INTRAVENOUS | Status: DC | PRN
  Administered 2021-11-26: 50 mg via INTRAVENOUS

## 2021-11-26 MED ORDER — PANTOPRAZOLE SODIUM 40 MG PO TBEC: 40.0000 mg | DELAYED_RELEASE_TABLET | Freq: Every day | ORAL | Status: DC

## 2021-11-26 MED ORDER — ROCURONIUM BROMIDE 10 MG/ML (PF) SYRINGE
PREFILLED_SYRINGE | INTRAVENOUS | Status: DC | PRN
  Administered 2021-11-26 (×3): 20 mg via INTRAVENOUS
  Administered 2021-11-26: 30 mg via INTRAVENOUS
  Administered 2021-11-26: 20 mg via INTRAVENOUS
  Administered 2021-11-26: 30 mg via INTRAVENOUS
  Administered 2021-11-26: 20 mg via INTRAVENOUS
  Administered 2021-11-26: 60 mg via INTRAVENOUS
  Administered 2021-11-26: 80 mg via INTRAVENOUS

## 2021-11-26 MED ORDER — FUROSEMIDE 10 MG/ML IJ SOLN
20.0000 mg | Freq: Once | INTRAMUSCULAR | Status: AC
  Administered 2021-11-26: 20 mg via INTRAVENOUS
  Filled 2021-11-26 (×2): qty 2

## 2021-11-26 MED ORDER — HEPARIN SODIUM (PORCINE) 5000 UNIT/ML IJ SOLN
5000.0000 [IU] | Freq: Three times a day (TID) | INTRAMUSCULAR | Status: DC
  Administered 2021-11-27 – 2021-11-29 (×7): 5000 [IU] via SUBCUTANEOUS
  Filled 2021-11-26 (×6): qty 1

## 2021-11-26 MED ORDER — NOREPINEPHRINE 4 MG/250ML-% IV SOLN
INTRAVENOUS | Status: DC | PRN
  Administered 2021-11-26: 5 ug/min via INTRAVENOUS

## 2021-11-26 MED ORDER — MIDAZOLAM HCL 5 MG/5ML IJ SOLN
INTRAMUSCULAR | Status: DC | PRN
  Administered 2021-11-26: 2 mg via INTRAVENOUS

## 2021-11-26 MED ORDER — GLYCOPYRROLATE PF 0.2 MG/ML IJ SOSY
PREFILLED_SYRINGE | INTRAMUSCULAR | Status: DC | PRN
  Administered 2021-11-26: .2 mg via INTRAVENOUS

## 2021-11-26 SURGICAL SUPPLY — 82 items
ADH SKN CLS APL DERMABOND .7 (GAUZE/BANDAGES/DRESSINGS) ×2
BAG COUNTER SPONGE SURGICOUNT (BAG) ×3 IMPLANT
BAG SNAP BAND KOVER 36X36 (MISCELLANEOUS) ×1 IMPLANT
BAG SPNG CNTER NS LX DISP (BAG) ×2
BALLN MUSTANG 7.0X40 75 (BALLOONS) ×3
BALLN MUSTANG 7X60X75 (BALLOONS) ×3
BALLOON MUSTANG 7.0X40 75 (BALLOONS) IMPLANT
BALLOON MUSTANG 7X60X75 (BALLOONS) IMPLANT
CANISTER SUCT 3000ML PPV (MISCELLANEOUS) ×3 IMPLANT
CATH BEACON 5 .035 65 KMP TIP (CATHETERS) ×1 IMPLANT
CATH EMB 3FR 40CM (CATHETERS) ×1 IMPLANT
CATH EMB 4FR 40CM (CATHETERS) ×1 IMPLANT
CATH OMNI FLUSH 5F 65CM (CATHETERS) ×3 IMPLANT
CATH SHOCKWAVE M5 8.0X60 (CATHETERS) ×1 IMPLANT
CATH SOFT-VU ST 4F 90CM (CATHETERS) ×1 IMPLANT
CLIP TI WIDE RED SMALL 24 (CLIP) ×1 IMPLANT
CLIP VESOCCLUDE MED 24/CT (CLIP) ×3 IMPLANT
CLIP VESOCCLUDE SM WIDE 24/CT (CLIP) ×3 IMPLANT
CONNECTOR Y WND VAC (MISCELLANEOUS) IMPLANT
COVER SURGICAL LIGHT HANDLE (MISCELLANEOUS) ×1 IMPLANT
DERMABOND ADVANCED (GAUZE/BANDAGES/DRESSINGS) ×1
DERMABOND ADVANCED .7 DNX12 (GAUZE/BANDAGES/DRESSINGS) ×2 IMPLANT
DRESSING PEEL AND PLC PRVNA 13 (GAUZE/BANDAGES/DRESSINGS) IMPLANT
DRSG PEEL AND PLACE PREVENA 13 (GAUZE/BANDAGES/DRESSINGS) ×6
ELECT REM PT RETURN 9FT ADLT (ELECTROSURGICAL) ×6
ELECTRODE REM PT RTRN 9FT ADLT (ELECTROSURGICAL) ×2 IMPLANT
GLIDEWIRE ADV .035X260CM (WIRE) ×2 IMPLANT
GLOVE SRG 8 PF TXTR STRL LF DI (GLOVE) ×2 IMPLANT
GLOVE SURG POLYISO LF SZ7.5 (GLOVE) ×3 IMPLANT
GLOVE SURG SYN 7.5  E (GLOVE) ×12
GLOVE SURG SYN 7.5 E (GLOVE) ×8 IMPLANT
GLOVE SURG SYN 7.5 PF PI (GLOVE) IMPLANT
GLOVE SURG UNDER POLY LF SZ8 (GLOVE) ×3
GOWN STRL REUS W/ TWL LRG LVL3 (GOWN DISPOSABLE) ×4 IMPLANT
GOWN STRL REUS W/ TWL XL LVL3 (GOWN DISPOSABLE) ×2 IMPLANT
GOWN STRL REUS W/TWL LRG LVL3 (GOWN DISPOSABLE) ×6
GOWN STRL REUS W/TWL XL LVL3 (GOWN DISPOSABLE) ×3
HEMOSTAT SNOW SURGICEL 2X4 (HEMOSTASIS) IMPLANT
KIT BASIN OR (CUSTOM PROCEDURE TRAY) ×3 IMPLANT
KIT ENCORE 26 ADVANTAGE (KITS) ×1 IMPLANT
KIT TURNOVER KIT B (KITS) ×3 IMPLANT
NS IRRIG 1000ML POUR BTL (IV SOLUTION) ×6 IMPLANT
PACK ENDO MINOR (CUSTOM PROCEDURE TRAY) ×3 IMPLANT
PACK PERIPHERAL VASCULAR (CUSTOM PROCEDURE TRAY) ×3 IMPLANT
PAD ARMBOARD 7.5X6 YLW CONV (MISCELLANEOUS) ×6 IMPLANT
SET COLLECT BLD 21X3/4 12 (NEEDLE) IMPLANT
SET MICROPUNCTURE 5F STIFF (MISCELLANEOUS) ×3 IMPLANT
SHEATH BRITE TIP 7FR 35CM (SHEATH) ×1 IMPLANT
SHEATH BRITE TIP 7FRX11 (SHEATH) ×1 IMPLANT
SHEATH BRITE TIP 8FR 23CM (SHEATH) ×1 IMPLANT
SHEATH DESTINATION 8F 45CM (SHEATH) ×1 IMPLANT
SHEATH PINNACLE 5F 10CM (SHEATH) ×2 IMPLANT
SHEATH PINNACLE ST 7F 45CM (SHEATH) ×1 IMPLANT
STENT INNOVA 8X80X130 (Permanent Stent) ×1 IMPLANT
STENT VIABAHN 8X7.5X120 (Permanent Stent) ×1 IMPLANT
STENT VIABAHN VBX 8X79X80 (Permanent Stent) ×1 IMPLANT
STENT VIABAHNBX 8X59X80 (Permanent Stent) ×1 IMPLANT
STOPCOCK 4 WAY LG BORE MALE ST (IV SETS) ×1 IMPLANT
STOPCOCK MORSE 400PSI 3WAY (MISCELLANEOUS) ×3 IMPLANT
SUT ETHILON 3 0 PS 1 (SUTURE) IMPLANT
SUT PROLENE 5 0 C 1 24 (SUTURE) ×17 IMPLANT
SUT PROLENE 6 0 BV (SUTURE) ×6 IMPLANT
SUT SILK 2 0 PERMA HAND 18 BK (SUTURE) ×1 IMPLANT
SUT SILK 3 0 (SUTURE) ×3
SUT SILK 3-0 18XBRD TIE 12 (SUTURE) IMPLANT
SUT VIC AB 2-0 CT1 27 (SUTURE) ×9
SUT VIC AB 2-0 CT1 TAPERPNT 27 (SUTURE) ×2 IMPLANT
SUT VIC AB 3-0 SH 27 (SUTURE) ×6
SUT VIC AB 3-0 SH 27X BRD (SUTURE) ×2 IMPLANT
SUT VIC AB 3-0 X1 27 (SUTURE) ×4 IMPLANT
SUT VIC AB 4-0 PS2 18 (SUTURE) ×1 IMPLANT
SYR 3ML LL SCALE MARK (SYRINGE) ×1 IMPLANT
TOWEL GREEN STERILE (TOWEL DISPOSABLE) ×3 IMPLANT
TUBING EXTENTION W/L.L. (IV SETS) IMPLANT
TUBING HIGH PRESSURE 120CM (CONNECTOR) ×2 IMPLANT
UNDERPAD 30X36 HEAVY ABSORB (UNDERPADS AND DIAPERS) ×3 IMPLANT
WATER STERILE IRR 1000ML POUR (IV SOLUTION) ×3 IMPLANT
WIRE BENTSON .035X145CM (WIRE) ×3 IMPLANT
WIRE SPARTACORE .014X300CM (WIRE) ×1 IMPLANT
WIRE STARTER BENTSON 035X150 (WIRE) ×1 IMPLANT
WIRE TORQFLEX AUST .018X40CM (WIRE) ×1 IMPLANT
WND VAC CONN Y (MISCELLANEOUS) ×1

## 2021-11-26 NOTE — Anesthesia Postprocedure Evaluation (Signed)
Anesthesia Post Note ? ?Patient: Kevin Gross ? ?Procedure(s) Performed: BILATERAL FEMORAL ENDARTERECTOMY WITH VEIN PATCH ANGIOPLASTY (Bilateral: Groin) ?BILATERAL ILIAC STENTING USING 63mX80mm INNOVA STENT ON LEFT ILIAC AND 825m59mm, 65m60m9mm VBX AND 65mm365m5cm VIABAHN STENT ON RIGHT ILIAC (Bilateral: Groin) ?LEFT INFRARENAL LITHOTRIPSY (Left: Groin) ?VEIN HARVEST OF BILATERAL SPAHENOUS VEINS (Bilateral: Groin) ? ?  ? ?Patient location during evaluation: SICU ?Anesthesia Type: General ?Level of consciousness: sedated and patient remains intubated per anesthesia plan ?Pain management: pain level controlled ?Respiratory status: patient remains intubated per anesthesia plan and patient on ventilator - see flowsheet for VS ?Cardiovascular status: stable (stable presently on Vasopressin and Levophed) ?Postop Assessment: no apparent nausea or vomiting ?Anesthetic complications: yes ?Comments: Large pressor infiltration L wrist PIV. Good perfusion of hand at present, phentolamine and NTG vasodilation locally.  Vascular team following closely.  ? ? ? ? ?Last Vitals:  ?Vitals:  ? 11/26/21 0608 11/26/21 1732  ?BP: (!) 154/84 129/74  ?Pulse: (!) 54 78  ?Resp: 17 20  ?Temp: 36.5 ?C   ?SpO2: 100% 99%  ?  ?Last Pain:  ?Vitals:  ? 11/26/21 0618  ?TempSrc:   ?PainSc: 4   ? ? ?  ?  ?  ?  ?  ?  ? ?Disa Riedlinger,E. Aljean Horiuchi ? ? ? ? ?

## 2021-11-26 NOTE — Interval H&P Note (Signed)
History and Physical Interval Note: ? ?11/26/2021 ?7:29 AM ? ?Kevin Gross  has presented today for surgery, with the diagnosis of Atherosclerosis of native artery of both lower extremities with intermittent claudication.  The various methods of treatment have been discussed with the patient and family. After consideration of risks, benefits and other options for treatment, the patient has consented to  Procedure(s): ?BILATERAL FEMORAL ENDARTERECTOMY WITH PATCH ANGIOPLASTY (Bilateral) ?BILATERAL ILIAC STENTING (Bilateral) ?BILATERAL INFRARENAL LITHOTRIPSY (Bilateral) as a surgical intervention.  The patient's history has been reviewed, patient examined, no change in status, stable for surgery.  I have reviewed the patient's chart and labs.  Questions were answered to the patient's satisfaction.   ? ? ?[name] ? ? ?

## 2021-11-26 NOTE — Progress Notes (Signed)
Patient arrived to 2H20 from OR intubated.  Patient had pressor infiltration in left arm (protocol started in OR).  Arm circumference measured 35cm and arm marked for hematoma.  Levophed '@30mcg'$  and vasopressin 0.03 running through right arm PIV.  CCM called to bedside and will be placing central line.  Front tooth noted to be at a 90 degree angle and at risk of falling out.  Tooth pulled by CCM and placed in a specimen cup.  Daughter called and was updated by this RN. ?

## 2021-11-26 NOTE — Progress Notes (Signed)
?  Transition of Care (TOC) Screening Note ? ? ?Patient Details  ?Name: Kevin Gross ?Date of Birth: [birth year] ? ? ?Transition of Care (TOC) CM/SW Contact:    ?Milas Gain, LCSWA ?Phone Number: ?11/26/2021, 5:14 PM ? ? ? ?Transition of Care Department [facility]) has reviewed patient and no TOC needs have been identified at this time. We will continue to monitor patient advancement through interdisciplinary progression rounds. If new patient transition needs arise, please place a TOC consult. ?  ?

## 2021-11-26 NOTE — Op Note (Signed)
? ? ?Patient name: Kevin Gross MRN: [id number] DOB: 1956/03/10 Sex: male ? ?11/26/2021 ?Pre-operative Diagnosis: Bilateral claudication ?Post-operative diagnosis:  Same ?Surgeon:  Annamarie Major ?Co-surgeon: Servando Snare, MD ?Assistant: Arlee Muslim, PA ?Procedure:   #1: Right iliofemoral endarterectomy with vein patch angioplasty ?  #2: Left iliofemoral endarterectomy with vein patch angioplasty ?  #3: Stent, left common iliac artery ?  #4: Stent, left external iliac artery ?  #5: Intra-arterial lithotripsy: Left common and external iliac arteries ?  #6: Intra-arterial lithotripsy: Right common and external iliac arteries ?  #7: Stent, right external iliac artery ?  #8: Stent, right common femoral artery ?  #9: Bilateral groin Praveena incisional wound VAC ?Anesthesia:  General ?Blood Loss:  2.5 L ?Specimens:  none ? ?Findings: In the right groin, there was extensive occlusive plaque extending from the proximal superficial femoral artery up under the inguinal ligament.  Groin incision, I was unable to get a good proximal endpoint.  A vein patch was placed down approximately 3 cm onto the superficial femoral artery.  There was a large defect in the posterior wall of the femoral artery/distal external iliac artery that could not be repaired surgically and so I ended up stenting this.  The patient has a Viabahn that comes down midway through the femoral head which is about 1 cm into the vein patch.  This was extended up with the [place].  On the left side, the vein patch goes down onto the superficial femoral artery for approximately 1.5 cm.  A single stent, INNOVA is deployed in the common and external iliac artery.  The patient had palpable femoral pulses at the end of the procedure.  There was an extensive amount of blood loss try to get control of the bleeding on the right groin. ? ?Indications: This is a 66 year old gentleman with severe bilateral claudication.  Angiography revealed extensive iliofemoral disease.   Because of his comorbidities, he was not a candidate for aortobifemoral bypass graft.  I felt the next best option was bilateral femoral endarterectomy with iliac stenting. ? ?Procedure:  The patient was identified in the holding area and taken to [place] [facility]  The patient was then placed supine on the table. general anesthesia was administered.  The patient was prepped and draped in the usual sterile fashion.  A time out was called and antibiotics were administered.  Please see Dr. Claretha Cooper note for details of the left femoral endarterectomy and vein patch angioplasty.  The vein patch goes on to the superficial femoral artery for approximate 1.5 cm. ? ?On the right side, I performed a longitudinal incision trying to stay above the groin crease.  Cautery was used about subcutaneous tissue.  I could not of done this case without the assistance of the PA because of the need for retractor holding and suctioning to facilitate exposure.  The PA also helped with closing the wounds.  Once I was able to get down to the common femoral artery large cerebellar retractors were placed.  I dissected out the superficial femoral and profundofemoral arteries and encircled them with Vesseloops.  I then proceeded proximally.  I got up under the inguinal ligament and divided the crossing veins.  The circumflex arteries were also individually isolated and I was able to get circumferential vessel loop around the proximal common femoral artery.  The artery was heavily calcified at this level. ? ?At this point the patient was fully heparinized.  After the heparin circulated, the arteries were occluded.  #  11 that was used to make an arteriotomy which was extended longitudinally with Potts scissors.  The patient had an occluded right common femoral and proximal superficial femoral artery.  I opened the superficial femoral artery for approximately 2 cm and removed the plaque until I got a good endpoint.  I tacked down the distal endpoint with  6-0 Prolene.  I also performed an eversion endarterectomy in the profundofemoral artery.  I then tried to get the plaque out proximally however I could not get beyond the plaque with the existing exposure. ? ?Dr. Donzetta Matters performed vein patch angioplasty of the left common femoral artery.  Once this was done the vein patch was cannulated with a micropuncture needle and a micropuncture sheath was placed followed by an 035 wire and a 7 [place] sheath.  Angiography was performed that identified significant stenosis within the external iliac artery extending up to the origin of the hypogastric artery.  We elected to treat these initially with shockwave lithotripsy.  This was done over an 014 platform.  A 8 x 60 balloon was utilized to treat the left common and external iliac arteries.  We then deployed a 8 x 80 INNOVA stent from the left common iliac into the external iliac artery.  This was postdilated with a 7 mm balloon.  We then advanced a 7 French sheath over the aortic bifurcation for balloon control so that I could complete the endarterectomy.  With this maneuver we did not get good proximal control and so I elected to stop the endarterectomy and placed the vein patch.  I harvested the lateral branch of the saphenous vein.  This was a healthy vein.  It was removed and opened and used for vein patch with 5-0 Prolene.  Prior to completion the appropriate flushing maneuvers were performed and the anastomosis was completed.  There was excellent backbleeding from the superficial femoral and profundofemoral artery. ? ?I then cannulated the vein patch with a micropuncture needle and was able to get an 018 wire up into the iliac artery.  Ultimately an 035 wire was placed and a 7 French sheath was inserted.  This was exchanged out for a 014 wire and shockwave lithotripsy of the right common iliac and external iliac was then performed.  Once this was completed there was significant bleeding from the artery.  I was unable to get  good control.  There was a significant amount of blood loss.  I inflated a balloon in the iliac artery from the left side which helped minimize the bleeding.  Ultimately it was noted that there was a large defect in the posterior wall of the artery.  I could not get to this.  I initially tried to stented with a 8 x 59 VBX.  I wanted to stay above the vein patch.  Unfortunately after stent deployment there was still significant bleeding and so I took a 8 x 75 Viabahn stent and landed this about 1 cm into the vein patch.  After performing this, we had good hemostasis.  I then extended stenting up to the hypogastric artery origin with a 8 x 79 VBX.  Follow-up imaging showed excellent inflow bilaterally.  Both vein patches were widely patent.  There were excellent palpable femoral pulses.  Next the sheaths were removed and the arteriotomies were closed with 5-0 Prolene.  The patient's heparin was reversed with 50 mg of protamine.  Once hemostasis was satisfactory, the femoral sheaths were closed with 2-0 Vicryl.  The subcutaneous tissue was  closed with additional layers of 3-0 Vicryl followed by subcuticular closure.  Prevena wound vacs were placed over the incisions. ? ?Because of the patient's extensive blood loss and pressor requirement, we elected to keep him intubated.  In addition, his forearm IV infiltrated.  This was the IV he was getting pressors through. ? ? ?Disposition: To ICU, intubated and in critical condition. ? ? ?V. Annamarie Major, M.D., FACS ?[facility] ?Office: [phone] ?Pager:  [phone]  ?

## 2021-11-26 NOTE — Procedures (Signed)
Central Venous Catheter Insertion Procedure Note ? ?[name]  ?[id number]  ?10-Feb-1956 ? ?Date:11/26/21  ?Time:6:21 PM  ? ?Provider Performing:Arista Kettlewell B Ishmael Holter  ? ?Procedure: Insertion of Non-tunneled Central Venous Catheter(36556) with US guidance (56213)  ? ?Indication(s) ?Medication administration ? ?Consent ?Unable to obtain consent due to emergent nature of procedure. ? ?Anesthesia ?Topical only with 1% lidocaine  ? ?Timeout ?Verified patient identification, verified procedure, site/side was marked, verified correct patient position, special equipment/implants available, medications/allergies/relevant history reviewed, required imaging and test results available. ? ?Sterile Technique ?Maximal sterile technique including full sterile barrier drape, hand hygiene, sterile gown, sterile gloves, mask, hair covering, sterile ultrasound probe cover (if used). ? ?Procedure Description ?Area of catheter insertion was cleaned with chlorhexidine and draped in sterile fashion.  With real-time ultrasound guidance a central venous catheter was placed into the right internal jugular vein. Guidewire confirmed in IJ prior to dilation. Nonpulsatile blood flow and easy flushing noted in all ports.  The catheter was sutured in place and sterile dressing applied. ? ?Complications/Tolerance ?None; patient tolerated the procedure well. ?Chest X-ray is ordered to verify placement for internal jugular or subclavian cannulation.   Chest x-ray is not ordered for femoral cannulation. ? ?EBL ?Minimal ? ?Specimen(s) ?None  ?

## 2021-11-26 NOTE — Anesthesia Procedure Notes (Signed)
Procedure Name: Intubation ?Date/Time: 11/26/2021 7:51 AM ?Performed by: Jenne Campus, CRNA ?Pre-anesthesia Checklist: Patient identified, Emergency Drugs available, Suction available and Patient being monitored ?Patient Re-evaluated:Patient Re-evaluated prior to induction ?Oxygen Delivery Method: Circle System Utilized ?Preoxygenation: Pre-oxygenation with 100% oxygen ?Induction Type: IV induction ?Ventilation: Mask ventilation without difficulty ?Laryngoscope Size: Glidescope and 4 ?Grade View: Grade I ?Tube type: Oral ?Tube size: 7.5 mm ?Number of attempts: 1 ?Airway Equipment and Method: Stylet and Oral airway ?Placement Confirmation: ETT inserted through vocal cords under direct vision, positive ETCO2 and breath sounds checked- equal and bilateral ?Tube secured with: Tape ?Dental Injury: Teeth and Oropharynx as per pre-operative assessment  ? ? ? ? ?

## 2021-11-26 NOTE — Transfer of Care (Signed)
Immediate Anesthesia Transfer of Care Note ? ?Patient: Kevin Gross ? ?Procedure(s) Performed: BILATERAL FEMORAL ENDARTERECTOMY WITH VEIN PATCH ANGIOPLASTY (Bilateral: Groin) ?BILATERAL ILIAC STENTING USING 71mX80mm INNOVA STENT ON LEFT ILIAC AND 814m59mm, 75m63m9mm VBX AND 75mm83m5cm VIABAHN STENT ON RIGHT ILIAC (Bilateral: Groin) ?LEFT INFRARENAL LITHOTRIPSY (Left: Groin) ?VEIN HARVEST OF BILATERAL SPAHENOUS VEINS (Bilateral: Groin) ? ?Patient Location: SICU ? ?Anesthesia Type:General ? ?Level of Consciousness: Patient remains intubated per anesthesia plan ? ?Airway & Oxygen Therapy: Patient remains intubated per anesthesia plan and Patient placed on Ventilator (see vital sign flow sheet for setting) ? ?Post-op Assessment: Report given to RN and Post -op Vital signs reviewed and stable ? ?Post vital signs: Reviewed and stable ? ?Last Vitals:  ?Vitals Value Taken Time  ?BP 129/74 11/26/21 1732  ?Temp    ?Pulse 78 11/26/21 1748  ?Resp 20 11/26/21 1748  ?SpO2 99 % 11/26/21 1748  ?Vitals shown include unvalidated device data. ? ?Last Pain:  ?Vitals:  ? 11/26/21 0618  ?TempSrc:   ?PainSc: 4   ?   ? ?Patients Stated Pain Goal: 3 (11/26/21 [id number]?Complications: No notable events documented. ?

## 2021-11-26 NOTE — Progress Notes (Addendum)
eLink Physician-Brief Progress Note ?Patient Name: Kevin Gross ?DOB: 1956-04-17 ?MRN: [id number] ? ? ?Date of Service ? 11/26/2021  ?HPI/Events of Note ? Received request for vasopressin.  ? ?PT is post bilateral iliofemoral endarterectomy with vein patch angioplasty with significant bleeding intraoperatively.  Hgb at 13.  Pt is getting another unit of pRBC now.  ? ?Pressor requirements are going up.  Levophed now at 20 mcg/min.  ? ?Left arm with infiltration of levophed is marked and monitored.  ?eICU Interventions ? Add vasopressin.  ?Ok to start on precedex gtt.   ? ? ? ?Intervention Category ?Intermediate Interventions: Hypotension - evaluation and management ? ?Elsie Lincoln ?11/26/2021, 7:17 PM ? ?7:56 PM ?Labs came back with K 7.0, crea 2.11, lactic acid 5.3. Pt is getting calcium gluconate 2g IV.  ? ?Plan> ?Give insulin, NaHCO3, and lokelma.  Repeat K in 4 hours.  ?Add fentanyl gtt.  ? ?8:09 PM ?ABG 7.22/45.9/139 on TV 540, rate 20, PEEP 5, 40% FiO2.  ? ?Plan> ?Increase RR to 24.  ? ?11:22 PM ?Lactate improving to 4.6 (from 5.3), K now 6.1 (from 7.0), Mg 1.3.  ? ?Plan> ?Replete Mg.  ?Give another round of NaHCO3, insulin, and lokelma.  ? ?2:40 AM ?Pt with decreased urine output, now at 15cc/hr.  ? ?Plan> ?Start on NS'@100cc'$ /hr.  ?Pt remains on pressors, pt sedated on fentanyl and precedex.  ? ?4:50 AM ?ABG 7.298/39.7/116.  K 5.2, calcium 6.7, corrected for low albumin is 7.9.  ? ?Plan> ?Replete Calcium.  ?Increase minute ventilation by increasing tidal volume to 560. ? ?

## 2021-11-26 NOTE — Anesthesia Procedure Notes (Signed)
Arterial Line Insertion ?Start/End3/31/2023 7:15 AM, 11/26/2021 7:25 AM ?Performed by: CRNA ? Preanesthetic checklist: patient identified, IV checked, site marked, risks and benefits discussed, surgical consent, monitors and equipment checked, pre-op evaluation and timeout performed ?Lidocaine 1% used for infiltration ?Right, radial was placed ?Catheter size: 20 G ?Hand hygiene performed  and maximum sterile barriers used  ? ?Attempts: 2 ?Procedure performed using ultrasound guided technique. ?Following insertion, Biopatch and dressing applied. ?Patient tolerated the procedure well with no immediate complications. ? ? ?

## 2021-11-26 NOTE — Consult Note (Addendum)
? ?NAME:  Kevin Gross, MRN:  [id number], DOB:  Feb 06, 1956, LOS: 0 ?ADMISSION DATE:  11/26/2021, CONSULTATION DATE:  11/26/2021 ?REFERRING MD:  Harold Barban  CHIEF COMPLAINT:  Post op ? ?History of Present Illness:  ?66 year old male with PVD, CAD, COPD, OSA, liver cirrhosis, hyperlipidemia, hypertension, pre-diabetes admitted with bilateral femoral endarterectomy with patch angioplasty and bilateral iliac stenting with infrarenal lithotripsy requiring ICU post procedure. ? ?Patient presented today for above procedure.  Intubated for procedure without difficulty. Intra-op: EBL: 2.5 L. Patient received: 2.5 L PRBC, 5. 5 L NS, 1 L Albumin.  Net balance + 6.9 L.  Patient required levophed and dopamine during the case, with soft tissue infiltration. S/p phentolamine.  Post-op, patient is returning to the ICU intubated.  ? ?On arrival to ICU, he was sedated on MV, on levophed 30. ? ?Pertinent  Medical History  ? ?Past Medical History:  ?Diagnosis Date  ? Alcoholic cirrhosis of liver ([place])   ? CHF (congestive heart failure) ([place])   ? Clotting disorder ([place])   ? Bilateral legs-pt states plaque build up in both legs requiring intervention  ? Complication of anesthesia   ? pt states that years ago during colonoscopy at [facility] they had to abort the procedure due to some "cardiac issue" had to see cardiologist  ? COPD (chronic obstructive pulmonary disease) ([place])   ? Hyperlipidemia   ? Hypertension   ? Pre-diabetes   ? Sleep apnea   ? uses cpap  ? ? ? ?Significant Hospital Events: ?Including procedures, antibiotic start and stop dates in addition to other pertinent events   ?3/31: Admit ?3/31: Intubated for OR: s/p  bilateral femoral endarterectomy with patch angioplasty and bilateral iliac stenting with infrarenal lithotripsy ? ?Interim History / Subjective:  ?New admit ? ?Objective   ?Blood pressure (!) 154/84, pulse (!) 54, temperature 97.7 ?F (36.5 ?C), temperature source Oral, resp. rate 17, height '5\' 8"'$  (1.727 m), weight  136.1 kg, SpO2 100 %. ?   ?   ? ?Intake/Output Summary (Last 24 hours) at 11/26/2021 1706 ?Last data filed at 11/26/2021 1637 ?Gross per 24 hour  ?Intake 9120 ml  ?Output 2930 ml  ?Net 6190 ml  ? ?Filed Weights  ? 11/26/21 0737  ?Weight: 136.1 kg  ? ?Physical Exam: ?General: Well-appearing, sedated ?HENT: [place], AT, ETT in place ?Eyes: EOMI, no scleral icterus ?Respiratory: Clear to auscultation bilaterally.  No crackles, wheezing or rales ?Cardiovascular: RRR, -M/R/G, no JVD ?GI: BS+, soft, nontender ?Neuro: Sedated ?GU: Foley in place ?Extremities: Right upper extremity infiltration, pulses present on doppler in extremities x 4 ? ? ? ?Resolved Hospital Problem list   ? ? ?Assessment & Plan:  ?PAD s/p bilateral femoral endarterectomy with patch angioplasty and bilateral iliac stenting with infrarenal lithotripsy ?-Vascular surgery primary ?-Goal BP: Normotension ?-Post-op management per primary team ? ?Acute hypoxic respiratory failure ?-Intubated 3/31 for procedure, unable to liberate post procedure due to high volume resuscitation ?-CXR and ABG pending ?-Continue full vnet support with lung protective strategies (goal Vt's 6-8 ml/ideal kg). ?-PAD: Propofol with PRN fentanyl for goal RASS -1 to 1 ? ?COPD, no acute exacerbation ?-Continue PRN albuterol ? ?Hemorrhagic Shock ?-Suspect multifactorial with acute blood loss anemia and prolonged procedure. + history of CAD ?-TTE on 11/08/2021: LVEF 55-60%, no regional wall motion abnormalities, LVH. RV normal function and mildly enlarged. Ascending aorta 40 mm.  ?-EKG and troponin pending ?-Repeat labs on arrival: lactate, bmp, cbc, trop.  ?-Obtain central access ?-Wean levophed for  MAP goal >65 ?-Transfuse 1 additional U PRBC now ?-Trend CBC, LA ? ?Acute blood loss anemia ?-secondary to above ?-s/p 2.5 L EBL ?-s/p 8 Units PRBC.  ?-INR 1 intra-op.  ?-CBC pending  ?-If ongoing bleeding, will add cryo and calcium supplementation.  ? ?Soft tissue infiltration of  vasopressors ?-Location RUL ?-Distal pulses present ?-Edema :None  ?-Vascular evaluated: Continue to monitor. No evidence of compartment syndrome ? ?Coronary artery disease ?Hyperlipidemia ?--Resume home zetia ? ?Hypertension, history ?--hold home antihypertensives: atenolol, chlorthalidone, lisinopril  ? ?CKD, stage III ?-Baseline creatinine 1.7 ?-High risk for development of AKI with ATN over the next few days. Continue foley, strict I/O and avoid nephrotoxins ? ?Alcoholic cirrhosis ?-No home medications ? ?Obesity ?-BMI 45.6 ? ?Best Practice (right click and "Reselect all SmartList Selections" daily)  ? ?Diet/type: NPO Nutrition consult placed  ?DVT prophylaxis: other ?GI prophylaxis: PPI ?Lines: Arterial Line and yes and it is still needed ?Foley:  Yes, and it is still needed ?Code Status:  full code ?Last date of multidisciplinary goals of care discussion '[]'$  Per primary team ? ?Labs   ?CBC: ?Recent Labs  ?Lab 11/23/21 ?1430  ?WBC 6.9  ?HGB 13.6  ?HCT 43.3  ?MCV 106.1*  ?PLT 157  ? ? ?Basic Metabolic Panel: ?Recent Labs  ?Lab 11/23/21 ?1430  ?NA 142  ?K 4.5  ?CL 110  ?CO2 26  ?GLUCOSE 126*  ?BUN 30*  ?CREATININE 1.79*  ?CALCIUM 9.3  ? ?GFR: ?Estimated Creatinine Clearance: 56.3 mL/min (A) (by C-G formula based on SCr of 1.79 mg/dL (H)). ?Recent Labs  ?Lab 11/23/21 ?1430  ?WBC 6.9  ? ? ?Liver Function Tests: ?Recent Labs  ?Lab 11/23/21 ?1430  ?AST 21  ?ALT 15  ?ALKPHOS 38  ?BILITOT 0.8  ?PROT 7.0  ?ALBUMIN 3.7  ? ?No results for input(s): LIPASE, AMYLASE in the last 168 hours. ?No results for input(s): AMMONIA in the last 168 hours. ? ?ABG ?   ?Component Value Date/Time  ? HCO3 29.7 (H) 06/28/2017 1832  ? O2SAT 60.9 06/28/2017 1832  ?  ? ?Coagulation Profile: ?Recent Labs  ?Lab 11/23/21 ?1430  ?INR 1.0  ? ? ?Cardiac Enzymes: ?No results for input(s): CKTOTAL, CKMB, CKMBINDEX, TROPONINI in the last 168 hours. ? ?HbA1C: ?No results found for: HGBA1C ? ?CBG: ?Recent Labs  ?Lab 11/26/21 ?0610  ?GLUCAP 88  ? ? ?Review  of Systems:   ?Unable to obtain due to intubated status ? ?Past Medical History:  ?He,  has a past medical history of Alcoholic cirrhosis of liver ([place]), CHF (congestive heart failure) ([place]), Clotting disorder ([place]), Complication of anesthesia, COPD (chronic obstructive pulmonary disease) ([place]), Hyperlipidemia, Hypertension, Pre-diabetes, and Sleep apnea.  ? ?Surgical History:  ? ?Past Surgical History:  ?Procedure Laterality Date  ? ABDOMINAL AORTOGRAM W/LOWER EXTREMITY N/A 10/20/2021  ? Procedure: ABDOMINAL AORTOGRAM W/LOWER EXTREMITY;  Surgeon: Wellington Hampshire, MD;  Location: [place] CV LAB;  Service: Cardiovascular;  Laterality: N/A;  ? CARDIAC CATHETERIZATION    ? [place]  ? COLONOSCOPY    ? COLONOSCOPY WITH PROPOFOL N/A 01/17/2018  ? Procedure: COLONOSCOPY WITH PROPOFOL;  Surgeon: Toledo, Benay Pike, MD;  Location: [facility] ENDOSCOPY;  Service: Gastroenterology;  Laterality: N/A;  ? ESOPHAGOGASTRODUODENOSCOPY (EGD) WITH PROPOFOL N/A 01/17/2018  ? Procedure: ESOPHAGOGASTRODUODENOSCOPY (EGD) WITH PROPOFOL;  Surgeon: Toledo, Benay Pike, MD;  Location: [facility] ENDOSCOPY;  Service: Gastroenterology;  Laterality: N/A;  ? ORIF ANKLE FRACTURE Right   ? PROSTATE BIOPSY N/A 04/01/2021  ? Procedure: PROSTATE BIOPSY URONAV;  Surgeon: Yves Dill,  Otelia Limes, MD;  Location: [facility] ORS;  Service: Urology;  Laterality: N/A;  ?  ? ?Social History:  ? reports that he quit smoking about 7 years ago. His smoking use included cigarettes. He has a 47.00 pack-year smoking history. He has never used smokeless tobacco. He reports that he does not currently use alcohol. He reports that he does not use drugs.  ? ?Family History:  ?His family history includes Heart disease in his brother and mother.  ? ?Allergies ?Not on File  ? ?Home Medications  ?Prior to Admission medications   ?Medication Sig Start Date End Date Taking? Authorizing Provider  ?allopurinol (ZYLOPRIM) 300 MG tablet Take 300 mg by mouth every morning. 12/29/20  Yes [provider]   ?aspirin EC 81 MG tablet Take 81 mg by mouth every morning.   Yes [provider]  ?atenolol (TENORMIN) 25 MG tablet Take 25 mg by mouth every morning. 10/13/20  Yes [provider]  ?chlorthalidon

## 2021-11-26 NOTE — Consult Note (Incomplete Revision)
? ?NAME:  Kevin Gross, MRN:  [id number], DOB:  [birth year], LOS: 0 ?ADMISSION DATE:  11/26/2021, CONSULTATION DATE:  11/26/2021 ?REFERRING MD:  Harold Barban  CHIEF COMPLAINT:  Post op ? ?History of Present Illness:  ?66 year old male with PVD, CAD, COPD, OSA, liver cirrhosis, hyperlipidemia, hypertension, pre-diabetes admitted with bilateral femoral endarterectomy with patch angioplasty and bilateral iliac stenting with infrarenal lithotripsy requiring ICU post procedure. ? ?Patient presented today for above procedure.  Intubated for procedure without difficulty. Intra-op: EBL: 2.5 L. Patient received: 2.5 L PRBC, 5. 5 L NS, 1 L Albumin.  Net balance + 6.9 L.  Patient required levophed and dopamine during the case, with soft tissue infiltration. S/p phentolamine.  Post-op, patient is returning to the ICU intubated.  ? ?On arrival to ICU, he was sedated on MV, on levophed 30. ? ?Pertinent  Medical History  ? ?Past Medical History:  ?Diagnosis Date  ? Alcoholic cirrhosis of liver ([place])   ? CHF (congestive heart failure) ([place])   ? Clotting disorder ([place])   ? Bilateral legs-pt states plaque build up in both legs requiring intervention  ? Complication of anesthesia   ? pt states that years ago during colonoscopy at [facility] they had to abort the procedure due to some "cardiac issue" had to see cardiologist  ? COPD (chronic obstructive pulmonary disease) ([place])   ? Hyperlipidemia   ? Hypertension   ? Pre-diabetes   ? Sleep apnea   ? uses cpap  ? ? ? ?Significant Hospital Events: ?Including procedures, antibiotic start and stop dates in addition to other pertinent events   ?3/31: Admit ?3/31: Intubated for OR: s/p  bilateral femoral endarterectomy with patch angioplasty and bilateral iliac stenting with infrarenal lithotripsy ? ?Interim History / Subjective:  ?New admit ? ?Objective   ?Blood pressure (!) 154/84, pulse (!) 54, temperature 97.7 ?F (36.5 ?C), temperature source Oral, resp. rate 17, height '5\' 8"'$  (1.727 m), weight  136.1 kg, SpO2 100 %. ?   ?   ? ?Intake/Output Summary (Last 24 hours) at 11/26/2021 1706 ?Last data filed at 11/26/2021 1637 ?Gross per 24 hour  ?Intake 9120 ml  ?Output 2930 ml  ?Net 6190 ml  ? ?Filed Weights  ? 11/26/21 3299  ?Weight: 136.1 kg  ? ?Physical Exam: ?General: Well-appearing, sedated ?HENT: [place], AT, ETT in place ?Eyes: EOMI, no scleral icterus ?Respiratory: Clear to auscultation bilaterally.  No crackles, wheezing or rales ?Cardiovascular: RRR, -M/R/G, no JVD ?GI: BS+, soft, nontender ?Neuro: Sedated ?GU: Foley in place ?Extremities: Right upper extremity infiltration, pulses present on doppler in extremities x 4 ? ? ? ?Resolved Hospital Problem list   ? ? ?Assessment & Plan:  ?PAD s/p bilateral femoral endarterectomy with patch angioplasty and bilateral iliac stenting with infrarenal lithotripsy ?-Vascular surgery primary ?-Goal BP: Normotension ?-Post-op management per primary team ? ?Acute hypoxic respiratory failure ?-Intubated 3/31 for procedure, unable to liberate post procedure due to high volume resuscitation ?-CXR and ABG pending ?-Continue full vnet support with lung protective strategies (goal Vt's 6-8 ml/ideal kg). ?-PAD: Propofol with PRN fentanyl for goal RASS -1 to 1 ? ?COPD, no acute exacerbation ?-Continue PRN albuterol ? ?Hemorrhagic Shock ?-Suspect multifactorial with acute blood loss anemia and prolonged procedure. + history of CAD ?-TTE on 11/08/2021: LVEF 55-60%, no regional wall motion abnormalities, LVH. RV normal function and mildly enlarged. Ascending aorta 40 mm.  ?-EKG and troponin pending ?-Repeat labs on arrival: lactate, bmp, cbc, trop.  ?-Obtain central access ?-Wean levophed for  MAP goal >65 ?-Transfuse 1 additional U PRBC now ?-Trend CBC, LA ? ?Acute blood loss anemia ?-secondary to above ?-s/p 2.5 L EBL ?-s/p 8 Units PRBC.  ?-INR 1 intra-op.  ?-CBC pending  ?-If ongoing bleeding, will add cryo and calcium supplementation.  ? ?Soft tissue infiltration of  vasopressors ?-Location RUL ?-Distal pulses present ?-Edema :None  ?-Vascular evaluated: Continue to monitor. No evidence of compartment syndrome ? ?Coronary artery disease ?Hyperlipidemia ?--Resume home zetia ? ?Hypertension, history ?--hold home antihypertensives: atenolol, chlorthalidone, lisinopril  ? ?CKD, stage III ?-Baseline creatinine 1.7 ?-High risk for development of AKI with ATN over the next few days. Continue foley, strict I/O and avoid nephrotoxins ? ?Alcoholic cirrhosis ?-No home medications ? ?Obesity ?-BMI 45.6 ? ?Best Practice (right click and "Reselect all SmartList Selections" daily)  ? ?Diet/type: NPO Nutrition consult placed  ?DVT prophylaxis: other ?GI prophylaxis: PPI ?Lines: Arterial Line and yes and it is still needed ?Foley:  Yes, and it is still needed ?Code Status:  full code ?Last date of multidisciplinary goals of care discussion '[]'$  Per primary team ? ?Labs   ?CBC: ?Recent Labs  ?Lab 11/23/21 ?1430  ?WBC 6.9  ?HGB 13.6  ?HCT 43.3  ?MCV 106.1*  ?PLT 157  ? ? ?Basic Metabolic Panel: ?Recent Labs  ?Lab 11/23/21 ?1430  ?NA 142  ?K 4.5  ?CL 110  ?CO2 26  ?GLUCOSE 126*  ?BUN 30*  ?CREATININE 1.79*  ?CALCIUM 9.3  ? ?GFR: ?Estimated Creatinine Clearance: 56.3 mL/min (A) (by C-G formula based on SCr of 1.79 mg/dL (H)). ?Recent Labs  ?Lab 11/23/21 ?1430  ?WBC 6.9  ? ? ?Liver Function Tests: ?Recent Labs  ?Lab 11/23/21 ?1430  ?AST 21  ?ALT 15  ?ALKPHOS 38  ?BILITOT 0.8  ?PROT 7.0  ?ALBUMIN 3.7  ? ?No results for input(s): LIPASE, AMYLASE in the last 168 hours. ?No results for input(s): AMMONIA in the last 168 hours. ? ?ABG ?   ?Component Value Date/Time  ? HCO3 29.7 (H) 06/28/2017 1832  ? O2SAT 60.9 06/28/2017 1832  ?  ? ?Coagulation Profile: ?Recent Labs  ?Lab 11/23/21 ?1430  ?INR 1.0  ? ? ?Cardiac Enzymes: ?No results for input(s): CKTOTAL, CKMB, CKMBINDEX, TROPONINI in the last 168 hours. ? ?HbA1C: ?No results found for: HGBA1C ? ?CBG: ?Recent Labs  ?Lab 11/26/21 ?0610  ?GLUCAP 88  ? ? ?Review  of Systems:   ?Unable to obtain due to intubated status ? ?Past Medical History:  ?He,  has a past medical history of Alcoholic cirrhosis of liver ([place]), CHF (congestive heart failure) ([place]), Clotting disorder ([place]), Complication of anesthesia, COPD (chronic obstructive pulmonary disease) ([place]), Hyperlipidemia, Hypertension, Pre-diabetes, and Sleep apnea.  ? ?Surgical History:  ? ?Past Surgical History:  ?Procedure Laterality Date  ? ABDOMINAL AORTOGRAM W/LOWER EXTREMITY N/A 10/20/2021  ? Procedure: ABDOMINAL AORTOGRAM W/LOWER EXTREMITY;  Surgeon: Wellington Hampshire, MD;  Location: [place] CV LAB;  Service: Cardiovascular;  Laterality: N/A;  ? CARDIAC CATHETERIZATION    ? [place]  ? COLONOSCOPY    ? COLONOSCOPY WITH PROPOFOL N/A 01/17/2018  ? Procedure: COLONOSCOPY WITH PROPOFOL;  Surgeon: Toledo, Benay Pike, MD;  Location: [facility] END[facility]COPY;  Service: Gastroenterology;  Laterality: N/A;  ? ESOPHAGOGASTRODUODENOSCOPY (EGD) WITH PROPOFOL N/A 01/17/2018  ? Procedure: ESOPHAGOGASTRODUODENOSCOPY (EGD) WITH PROPOFOL;  Surgeon: Toledo, Benay Pike, MD;  Location: [facility] END[facility];  Service: Gastroenterology;  Laterality: N/A;  ? ORIF ANKLE FRACTURE Right   ? PROSTATE BIOPSY N/A 04/01/2021  ? Procedure: PROSTATE BIOPSY URONAV;  Surgeon: Yves Dill,  Otelia Limes, MD;  Location: [facility] ORS;  Service: Urology;  Laterality: N/A;  ?  ? ?Social History:  ? reports that he quit smoking about 7 years ago. His smoking use included cigarettes. He has a 47.00 pack-year smoking history. He has never used smokeless tobacco. He reports that he does not currently use alcohol. He reports that he does not use drugs.  ? ?Family History:  ?His family history includes Heart disease in his brother and mother.  ? ?Allergies ?Not on File  ? ?Home Medications  ?Prior to Admission medications   ?Medication Sig Start Date End Date Taking? Authorizing Provider  ?allopurinol (ZYLOPRIM) 300 MG tablet Take 300 mg by mouth every morning. 12/29/20  Yes [provider]   ?aspirin EC 81 MG tablet Take 81 mg by mouth every morning.   Yes [provider]  ?atenolol (TENORMIN) 25 MG tablet Take 25 mg by mouth every morning. 10/13/20  Yes [provider]  ?chlorthalidon

## 2021-11-26 NOTE — Progress Notes (Addendum)
Called by RN regarding very loose right central incisor tooth sitting at >90 degree angle with majority not connected to gum with roots exposed. High risk of being dislodged into airway. Gently removed and stored in labeled container at bedside. PCCM updated wife regarding post-op condition with bleeding, IV infiltration with pressors and tooth loss. ?

## 2021-11-27 ENCOUNTER — Inpatient Hospital Stay (HOSPITAL_COMMUNITY): Payer: Medicare PPO

## 2021-11-27 DIAGNOSIS — R571 Hypovolemic shock: Secondary | ICD-10-CM

## 2021-11-27 DIAGNOSIS — I739 Peripheral vascular disease, unspecified: Secondary | ICD-10-CM

## 2021-11-27 DIAGNOSIS — J9601 Acute respiratory failure with hypoxia: Secondary | ICD-10-CM | POA: Diagnosis not present

## 2021-11-27 DIAGNOSIS — D62 Acute posthemorrhagic anemia: Secondary | ICD-10-CM

## 2021-11-27 DIAGNOSIS — I70219 Atherosclerosis of native arteries of extremities with intermittent claudication, unspecified extremity: Secondary | ICD-10-CM | POA: Diagnosis not present

## 2021-11-27 LAB — POCT I-STAT 7, (LYTES, BLD GAS, ICA,H+H)
Acid-base deficit: 2 mmol/L (ref 0.0–2.0)
Acid-base deficit: 6 mmol/L — ABNORMAL HIGH (ref 0.0–2.0)
Acid-base deficit: 8 mmol/L — ABNORMAL HIGH (ref 0.0–2.0)
Acid-base deficit: 5 mmol/L — ABNORMAL HIGH (ref 0.0–2.0)
Acid-base deficit: 9 mmol/L — ABNORMAL HIGH (ref 0.0–2.0)
Acid-base deficit: 7 mmol/L — ABNORMAL HIGH (ref 0.0–2.0)
Acid-base deficit: 7 mmol/L — ABNORMAL HIGH (ref 0.0–2.0)
Bicarbonate: 23.7 mmol/L (ref 20.0–28.0)
Bicarbonate: 19.9 mmol/L — ABNORMAL LOW (ref 20.0–28.0)
Bicarbonate: 18.1 mmol/L — ABNORMAL LOW (ref 20.0–28.0)
Bicarbonate: 18.7 mmol/L — ABNORMAL LOW (ref 20.0–28.0)
Bicarbonate: 22.1 mmol/L (ref 20.0–28.0)
Bicarbonate: 20.4 mmol/L (ref 20.0–28.0)
Bicarbonate: 19.5 mmol/L — ABNORMAL LOW (ref 20.0–28.0)
Calcium, Ion: 1.2 mmol/L (ref 1.15–1.40)
Calcium, Ion: 1.1 mmol/L — ABNORMAL LOW (ref 1.15–1.40)
Calcium, Ion: 0.97 mmol/L — ABNORMAL LOW (ref 1.15–1.40)
Calcium, Ion: 0.9 mmol/L — ABNORMAL LOW (ref 1.15–1.40)
Calcium, Ion: 0.93 mmol/L — ABNORMAL LOW (ref 1.15–1.40)
Calcium, Ion: 0.9 mmol/L — ABNORMAL LOW (ref 1.15–1.40)
Calcium, Ion: 0.97 mmol/L — ABNORMAL LOW (ref 1.15–1.40)
HCT: 31 % — ABNORMAL LOW (ref 39.0–52.0)
HCT: 29 % — ABNORMAL LOW (ref 39.0–52.0)
HCT: 29 % — ABNORMAL LOW (ref 39.0–52.0)
HCT: 35 % — ABNORMAL LOW (ref 39.0–52.0)
HCT: 40 % (ref 39.0–52.0)
HCT: 35 % — ABNORMAL LOW (ref 39.0–52.0)
HCT: 36 % — ABNORMAL LOW (ref 39.0–52.0)
Hemoglobin: 10.5 g/dL — ABNORMAL LOW (ref 13.0–17.0)
Hemoglobin: 9.9 g/dL — ABNORMAL LOW (ref 13.0–17.0)
Hemoglobin: 9.9 g/dL — ABNORMAL LOW (ref 13.0–17.0)
Hemoglobin: 11.9 g/dL — ABNORMAL LOW (ref 13.0–17.0)
Hemoglobin: 13.6 g/dL (ref 13.0–17.0)
Hemoglobin: 11.9 g/dL — ABNORMAL LOW (ref 13.0–17.0)
Hemoglobin: 12.2 g/dL — ABNORMAL LOW (ref 13.0–17.0)
O2 Saturation: 99 %
O2 Saturation: 100 %
O2 Saturation: 100 %
O2 Saturation: 99 %
O2 Saturation: 99 %
O2 Saturation: 99 %
O2 Saturation: 98 %
Patient temperature: 36.2
Patient temperature: 36.8
Patient temperature: 36.2
Patient temperature: 36.5
Patient temperature: 36.2
Patient temperature: 36.7
Potassium: 4.8 mmol/L (ref 3.5–5.1)
Potassium: 5 mmol/L (ref 3.5–5.1)
Potassium: 5.2 mmol/L — ABNORMAL HIGH (ref 3.5–5.1)
Potassium: 6.6 mmol/L (ref 3.5–5.1)
Potassium: 7.1 mmol/L (ref 3.5–5.1)
Potassium: 6.6 mmol/L (ref 3.5–5.1)
Potassium: 5 mmol/L (ref 3.5–5.1)
Sodium: 142 mmol/L (ref 135–145)
Sodium: 143 mmol/L (ref 135–145)
Sodium: 143 mmol/L (ref 135–145)
Sodium: 139 mmol/L (ref 135–145)
Sodium: 143 mmol/L (ref 135–145)
Sodium: 144 mmol/L (ref 135–145)
Sodium: 143 mmol/L (ref 135–145)
TCO2: 25 mmol/L (ref 22–32)
TCO2: 21 mmol/L — ABNORMAL LOW (ref 22–32)
TCO2: 19 mmol/L — ABNORMAL LOW (ref 22–32)
TCO2: 20 mmol/L — ABNORMAL LOW (ref 22–32)
TCO2: 24 mmol/L (ref 22–32)
TCO2: 22 mmol/L (ref 22–32)
TCO2: 21 mmol/L — ABNORMAL LOW (ref 22–32)
pCO2 arterial: 44 mmHg (ref 32–48)
pCO2 arterial: 38.1 mmHg (ref 32–48)
pCO2 arterial: 37.9 mmHg (ref 32–48)
pCO2 arterial: 45.6 mmHg (ref 32–48)
pCO2 arterial: 49.3 mmHg — ABNORMAL HIGH (ref 32–48)
pCO2 arterial: 46.6 mmHg (ref 32–48)
pCO2 arterial: 39.7 mmHg (ref 32–48)
pH, Arterial: 7.334 — ABNORMAL LOW (ref 7.35–7.45)
pH, Arterial: 7.326 — ABNORMAL LOW (ref 7.35–7.45)
pH, Arterial: 7.288 — ABNORMAL LOW (ref 7.35–7.45)
pH, Arterial: 7.217 — ABNORMAL LOW (ref 7.35–7.45)
pH, Arterial: 7.256 — ABNORMAL LOW (ref 7.35–7.45)
pH, Arterial: 7.244 — ABNORMAL LOW (ref 7.35–7.45)
pH, Arterial: 7.298 — ABNORMAL LOW (ref 7.35–7.45)
pO2, Arterial: 162 mmHg — ABNORMAL HIGH (ref 83–108)
pO2, Arterial: 188 mmHg — ABNORMAL HIGH (ref 83–108)
pO2, Arterial: 185 mmHg — ABNORMAL HIGH (ref 83–108)
pO2, Arterial: 141 mmHg — ABNORMAL HIGH (ref 83–108)
pO2, Arterial: 159 mmHg — ABNORMAL HIGH (ref 83–108)
pO2, Arterial: 168 mmHg — ABNORMAL HIGH (ref 83–108)
pO2, Arterial: 116 mmHg — ABNORMAL HIGH (ref 83–108)

## 2021-11-27 LAB — CBC
HCT: 38.6 % — ABNORMAL LOW (ref 39.0–52.0)
Hemoglobin: 13.2 g/dL (ref 13.0–17.0)
MCH: 29.8 pg (ref 26.0–34.0)
MCHC: 34.2 g/dL (ref 30.0–36.0)
MCV: 87.1 fL (ref 80.0–100.0)
Platelets: 136 10*3/uL — ABNORMAL LOW (ref 150–400)
RBC: 4.43 MIL/uL (ref 4.22–5.81)
RDW: 20.3 % — ABNORMAL HIGH (ref 11.5–15.5)
WBC: 17.1 10*3/uL — ABNORMAL HIGH (ref 4.0–10.5)
nRBC: 0 % (ref 0.0–0.2)

## 2021-11-27 LAB — COMPREHENSIVE METABOLIC PANEL
ALT: 8 U/L (ref 0–44)
AST: 16 U/L (ref 15–41)
Albumin: 2.5 g/dL — ABNORMAL LOW (ref 3.5–5.0)
Alkaline Phosphatase: 24 U/L — ABNORMAL LOW (ref 38–126)
Anion gap: 9 (ref 5–15)
BUN: 34 mg/dL — ABNORMAL HIGH (ref 8–23)
CO2: 20 mmol/L — ABNORMAL LOW (ref 22–32)
Calcium: 6.7 mg/dL — ABNORMAL LOW (ref 8.9–10.3)
Chloride: 112 mmol/L — ABNORMAL HIGH (ref 98–111)
Creatinine, Ser: 2.33 mg/dL — ABNORMAL HIGH (ref 0.61–1.24)
GFR, Estimated: 30 mL/min — ABNORMAL LOW (ref >60)
Glucose, Bld: 226 mg/dL — ABNORMAL HIGH (ref 70–99)
Potassium: 5.2 mmol/L — ABNORMAL HIGH (ref 3.5–5.1)
Sodium: 141 mmol/L (ref 135–145)
Total Bilirubin: 0.8 mg/dL (ref 0.3–1.2)
Total Protein: 4.3 g/dL — ABNORMAL LOW (ref 6.5–8.1)

## 2021-11-27 LAB — POCT ACTIVATED CLOTTING TIME
Activated Clotting Time: 227 seconds
Activated Clotting Time: 209 seconds
Activated Clotting Time: 209 seconds
Activated Clotting Time: 209 seconds
Activated Clotting Time: 203 seconds
Activated Clotting Time: 191 seconds
Activated Clotting Time: 149 seconds
Activated Clotting Time: 191 seconds
Activated Clotting Time: 227 seconds

## 2021-11-27 LAB — GLUCOSE, CAPILLARY
Glucose-Capillary: 121 mg/dL — ABNORMAL HIGH (ref 70–99)
Glucose-Capillary: 122 mg/dL — ABNORMAL HIGH (ref 70–99)
Glucose-Capillary: 203 mg/dL — ABNORMAL HIGH (ref 70–99)
Glucose-Capillary: 208 mg/dL — ABNORMAL HIGH (ref 70–99)
Glucose-Capillary: 212 mg/dL — ABNORMAL HIGH (ref 70–99)
Glucose-Capillary: 221 mg/dL — ABNORMAL HIGH (ref 70–99)

## 2021-11-27 LAB — LIPID PANEL
Cholesterol: 78 mg/dL (ref 0–200)
HDL: 21 mg/dL — ABNORMAL LOW (ref >40)
LDL Cholesterol: 24 mg/dL (ref 0–99)
Total CHOL/HDL Ratio: 3.7 RATIO
Triglycerides: 167 mg/dL — ABNORMAL HIGH (ref <150)
VLDL: 33 mg/dL (ref 0–40)

## 2021-11-27 LAB — CORTISOL: Cortisol, Plasma: 10.9 ug/dL

## 2021-11-27 LAB — POTASSIUM: Potassium: 5 mmol/L (ref 3.5–5.1)

## 2021-11-27 LAB — HEMOGLOBIN A1C
Hgb A1c MFr Bld: 5.6 % (ref 4.8–5.6)
Mean Plasma Glucose: 114.02 mg/dL

## 2021-11-27 LAB — MAGNESIUM
Magnesium: 2.3 mg/dL (ref 1.7–2.4)
Magnesium: 2 mg/dL (ref 1.7–2.4)

## 2021-11-27 LAB — PHOSPHORUS
Phosphorus: 2.9 mg/dL (ref 2.5–4.6)
Phosphorus: 3.1 mg/dL (ref 2.5–4.6)

## 2021-11-27 LAB — CK: Total CK: 206 U/L (ref 49–397)

## 2021-11-27 LAB — LACTIC ACID, PLASMA: Lactic Acid, Venous: 3.2 mmol/L (ref 0.5–1.9)

## 2021-11-27 MED ORDER — LACTATED RINGERS IV BOLUS
500.0000 mL | Freq: Once | INTRAVENOUS | Status: AC
  Administered 2021-11-27: 500 mL via INTRAVENOUS

## 2021-11-27 MED ORDER — SODIUM CHLORIDE 0.9 % IV SOLN: INTRAVENOUS | Status: DC

## 2021-11-27 MED ORDER — INSULIN ASPART 100 UNIT/ML IJ SOLN
0.0000 [IU] | INTRAMUSCULAR | Status: DC
  Administered 2021-11-27: 5 [IU] via SUBCUTANEOUS
  Administered 2021-11-27: 3 [IU] via SUBCUTANEOUS
  Administered 2021-11-27: 5 [IU] via SUBCUTANEOUS
  Administered 2021-11-28: 3 [IU] via SUBCUTANEOUS
  Administered 2021-11-28 (×3): 2 [IU] via SUBCUTANEOUS

## 2021-11-27 MED ORDER — VITAL 1.5 CAL PO LIQD
1000.0000 mL | ORAL | Status: DC
  Administered 2021-11-27: 1000 mL

## 2021-11-27 MED ORDER — MAGNESIUM SULFATE 2 GM/50ML IV SOLN
INTRAVENOUS | Status: AC
  Filled 2021-11-27: qty 50

## 2021-11-27 MED ORDER — SODIUM CHLORIDE 0.9 % IV SOLN
INTRAVENOUS | Status: DC | PRN
Start: 2021-11-27 — End: 2021-12-06

## 2021-11-27 MED ORDER — PROSOURCE TF PO LIQD
90.0000 mL | Freq: Three times a day (TID) | ORAL | Status: DC
  Administered 2021-11-27 – 2021-11-28 (×3): 90 mL
  Filled 2021-11-27 (×3): qty 90

## 2021-11-27 MED ORDER — CLOPIDOGREL BISULFATE 75 MG PO TABS
75.0000 mg | ORAL_TABLET | Freq: Every day | ORAL | Status: DC
  Administered 2021-11-27 – 2021-12-06 (×10): 75 mg via ORAL
  Filled 2021-11-27 (×10): qty 1

## 2021-11-27 MED ORDER — CALCIUM GLUCONATE-NACL 2-0.675 GM/100ML-% IV SOLN
2.0000 g | Freq: Once | INTRAVENOUS | Status: AC
  Administered 2021-11-27: 2000 mg via INTRAVENOUS
  Filled 2021-11-27: qty 100

## 2021-11-27 MED ORDER — ALBUMIN HUMAN 5 % IV SOLN
25.0000 g | Freq: Once | INTRAVENOUS | Status: AC
  Administered 2021-11-27: 25 g via INTRAVENOUS
  Filled 2021-11-27: qty 500

## 2021-11-27 MED ORDER — SILVER SULFADIAZINE 1 % EX CREA
TOPICAL_CREAM | Freq: Every day | CUTANEOUS | Status: DC
Start: 2021-11-27 — End: 2021-11-29
  Filled 2021-11-27: qty 85

## 2021-11-27 NOTE — Progress Notes (Signed)
Initial Nutrition Assessment ? ?DOCUMENTATION CODES:  ? ?Not applicable ? ?INTERVENTION:  ? ?Tube feeds via OG tube: ?- Change to Vital 1.5 @ 20 ml/hr and advance by 10 ml q 12 hours to goal rate of 50 ml/hr (1200 ml/day) ?- ProSource TF 90 ml TID ? ?Tube feeding regimen at goal rate provides 2040 kcal, 147 grams of protein, and 917 ml of H2O. ? ?Tube feeding regimen at goal rate and current propofol provides 2687 total kcal (>100% of needs). ? ?NUTRITION DIAGNOSIS:  ? ?Inadequate oral intake related to inability to eat as evidenced by NPO status. ? ?GOAL:  ? ?Patient will meet greater than or equal to 90% of their needs ? ?MONITOR:  ? ?Vent status, Labs, Weight trends, TF tolerance, I & O's ? ?REASON FOR ASSESSMENT:  ? ?Ventilator, Consult ?Enteral/tube feeding initiation and management, Assessment of nutrition requirement/status ? ?ASSESSMENT:  ? ?66 year old male who presented on 3/31 for vascular procedure. PMH of alcoholic cirrhosis, CHF, COPD, HLD, HTN, PVD, CAD, CKD stage III. ? ?03/31 - s/p bilateral femoral endarterectomy with patch angioplasty and bilateral iliac stenting with infrarenal lithotripsy ? ?Pt remains intubated post-op due to high volume resuscitation (EBL 2.5 L), hemorrhagic shock. Pt with pressor infiltration in left arm. ? ?Consult received for tube feeding initiation and management. Adult ICU Tube Feeding Protocol ordered yesterday evening. Per note in [facility] from RN, tube feeds were running at 10 ml/hr on RN assessment today at 0700. Per discussion with CCM MD, it is okay to advance tube feeds slowly to goal rate. Discussed with RN. RD to adjust tube feeding formula/regimen to better meet pt's needs. ? ?Per nursing edema assessment, pt with moderate pitting generalized edema. Unsure of pt's dry weight but suspect it to be close to 136-137 kg based on weights from previous encounters. Weight appears stable over the last 2 years. ? ?Patient is currently intubated on ventilator support ?MV: 12.5  L/min ?Temp (24hrs), Avg:98.4 ?F (36.9 ?C), Min:97.9 ?F (36.6 ?C), Max:98.8 ?F (37.1 ?C) ?BP (a-line): 166/72 ?MAP (a-line): 95 ? ?Drips: ?Propofol: 24.5 ml/hr (provides 647 kcal daily from lipid) ?Fentanyl ?Levophed ?Vasopressin ?LR: 150 ml/hr ?NS: 100 ml/hr ? ?Medications reviewed and include: colace, protonix, miralax, lokelma 10 grams daily, IV albumin 25 grams once ? ?Labs reviewed: BUN 34, creatinine 2.33, ionized calcium 0.97, TG 167, WBC 17.1, platelets 136 ?CBG's: 208-269 x 24 hours ? ?UOP: 995 ml x 24 hours ?I/O's: +9.1 L since admit ? ?NUTRITION - FOCUSED PHYSICAL EXAM: ? ?Unable to complete at this time. RD working remotely. ? ?Diet Order:   ?Diet Order   ? ?       ?  Diet NPO time specified  Diet effective now       ?  ? ?  ?  ? ?  ? ? ?EDUCATION NEEDS:  ? ?No education needs have been identified at this time ? ?Skin:  Skin Assessment: Reviewed RN Assessment (surgical incisions bilateral groin) ? ?Last BM:  no documented BM ? ?Height:  ? ?Ht Readings from Last 1 Encounters:  ?11/26/21 '5\' 8"'$  (1.727 m)  ? ? ?Weight:  ? ?Wt Readings from Last 1 Encounters:  ?11/26/21 136.1 kg  ? ? ?Ideal Body Weight:  70 kg ? ?BMI:  Body mass index is 45.61 kg/m?. ? ?Estimated Nutritional Needs:  ? ?Kcal:  2200-2400 ? ?Protein:  140-160 grams ? ?Fluid:  >/= 2.0 L ? ? ? ?Gustavus Bryant, MS, RD, LDN ?Inpatient Clinical Dietitian ?Please see [facility] for  contact information. ? ?

## 2021-11-27 NOTE — Progress Notes (Signed)
? ?NAME:  Kevin Gross, MRN:  [id number], DOB:  12/27/1955, LOS: 1 ?ADMISSION DATE:  11/26/2021, CONSULTATION DATE:  11/26/2021 ?REFERRING MD:  Harold Barban  CHIEF COMPLAINT:  Post op ? ?History of Present Illness:  ?66 year old male with PVD, CAD, COPD, OSA, liver cirrhosis, hyperlipidemia, hypertension, pre-diabetes admitted with bilateral femoral endarterectomy with patch angioplasty and bilateral iliac stenting with infrarenal lithotripsy requiring ICU post procedure. ? ?Patient presented today for above procedure.  Intubated for procedure without difficulty. Intra-op: EBL: 2.5 L. Patient received: 2.5 L PRBC, 5. 5 L NS, 1 L Albumin.  Net balance + 6.9 L.  Patient required levophed and dopamine during the case, with soft tissue infiltration. S/p phentolamine.  Post-op, patient is returning to the ICU intubated.  ? ?On arrival to ICU, he was sedated on MV, on levophed 30. ? ?Pertinent  Medical History  ? ?Past Medical History:  ?Diagnosis Date  ? Alcoholic cirrhosis of liver ([place])   ? CHF (congestive heart failure) ([place])   ? Clotting disorder ([place])   ? Bilateral legs-pt states plaque build up in both legs requiring intervention  ? Complication of anesthesia   ? pt states that years ago during colonoscopy at [facility] they had to abort the procedure due to some "cardiac issue" had to see cardiologist  ? COPD (chronic obstructive pulmonary disease) ([place])   ? Hyperlipidemia   ? Hypertension   ? Pre-diabetes   ? Sleep apnea   ? uses cpap  ? ? ? ?Significant Hospital Events: ?Including procedures, antibiotic start and stop dates in addition to other pertinent events   ?3/31: Admit ?3/31: Intubated for OR: s/p  bilateral femoral endarterectomy with patch angioplasty and bilateral iliac stenting with infrarenal lithotripsy ?4/1 incr pressor requirement. ? ?Interim History / Subjective:  ?Hgb stable ?CVP still low ? ?No significant output from woundvacs ?Incr pressor requirement ?Objective   ?Blood pressure 129/68, pulse 63,  temperature 97.9 ?F (36.6 ?C), resp. rate (!) 24, height '5\' 8"'$  (1.727 m), weight 136.1 kg, SpO2 93 %. ?CVP:  [4 mmHg-21 mmHg] 4 mmHg  ?Vent Mode: PRVC ?FiO2 (%):  [40 %] 40 % ?Set Rate:  [20 bmp-24 bmp] 24 bmp ?Vt Set:  [540 mL-560 mL] 560 mL ?PEEP:  [5 cmH20] 5 cmH20 ?Plateau Pressure:  [17 cmH20-19 cmH20] 17 cmH20  ? ?Intake/Output Summary (Last 24 hours) at 11/27/2021 1207 ?Last data filed at 11/27/2021 1200 ?Gross per 24 hour  ?Intake 12124.14 ml  ?Output 2445 ml  ?Net 9679.14 ml  ? ?Filed Weights  ? 11/26/21 4132  ?Weight: 136.1 kg  ? ?Physical Exam: ?General: Critically ill appearing obese older adult M intubated sedated NAD  ?HENT: NCAT pink mm ETT secure  ?Respiratory: CTAb , symmetrical chest expansion. Mechanically ventilated  ?Cardiovascular: bradycardic s1s2 no rgm  ?GI: Obese soft ndnt  ?Neuro: Awakens to voice. Intermittently following commands. 51m pupils. Sedated  ?GU: Foley yellow urine  ?Extremities: + pulses BUE BLE. No acute joint deformity  ?Skin: LUE purple discoloration and overlying blistering, mixed open and closed.  ? ?LUE 3/31: ? ? ? ?Resolved Hospital Problem list   ? ? ?Assessment & Plan:  ? ? ?PAD s/p bilat femoral endarterectomy with patch angioplasty, bilat iliac stenting with infrarenal lithotripsy  ?-Vascular surgery primary ?-Goal BP: Normotension ?-Post-op management per primary team ? ?Acute respiratory failure with hypoxia ?Hx COPD ?-high volume resuscitation in OR ?P ?-wean MV as able ?-would like to have greater hemodynamic stability before attempting extubation ?-PRN albuterol  ? ?  Hypovolemic shock ?-high volume intraop blood loss. Hgb now stable but remains intravascularly down.  ?-TTE on 11/08/2021: LVEF 55-60%, no regional wall motion abnormalities, LVH. RV normal function and mildly enlarged. Ascending aorta 40 mm.  ?P ?-cont Ne, vaso ?-consolidating IVF to just LR (was mIVF both LR and NS) ?-additional 568m LR bolus ?-albumin  ? ?Acute blood loss anemia  ?P ?-trend H/H  transfuse as needed  ? ?LUE soft tissue infiltration of vasopressors ?P ?-SSD  ?-closely follow pulses, demarcation of wound ? ?CAD ?HLD ?HTN ?-zetia  ?-holding antihypertensives with shock  ? ?AKI on CKD III ?Hyperkalemia  ?-Baseline creatinine 1.7, now 2.33 but making ?P ?-cont foley ?-trend renal indices UOP  ?-K correction as needed  ? ?Hypocalcemia ?-blood resusc ?P ?-2g cal glu ? ?Hyperglycemia ?P ?-adding SSI 4/1 ? ?Alcoholic cirrhosis  ?-PRN LFTs  ? ? ?Best Practice (right click and "Reselect all SmartList Selections" daily)  ? ?Diet/type: NPO Adv EN as tolerated to goal  ?DVT prophylaxis: other ?GI prophylaxis: PPI ?Lines: Arterial Line and yes and it is still needed ?Foley:  Yes, and it is still needed ?Code Status:  full code ?Last date of multidisciplinary goals of care discussion '[]'$  Per primary team ? ?Labs   ?CBC: ?Recent Labs  ?Lab 11/23/21 ?1430 11/26/21 ?1046 11/26/21 ?1815 11/26/21 ?1955 11/26/21 ?2158 11/27/21 ?0328 11/27/21 ?0345  ?WBC 6.9  --  19.9*  --  24.5* 17.1*  --   ?HGB 13.6   < > 13.3 13.3 14.2 13.2 12.2*  ?HCT 43.3   < > 39.1 39.0 41.3 38.6* 36.0*  ?MCV 106.1*  --  89.3  --  87.5 87.1  --   ?PLT 157  --  142*  --  143* 136*  --   ? < > = values in this interval not displayed.  ? ? ?Basic Metabolic Panel: ?Recent Labs  ?Lab 11/23/21 ?1430 11/26/21 ?1046 11/26/21 ?1605 11/26/21 ?1815 11/26/21 ?1955 11/26/21 ?2158 11/27/21 ?0328 11/27/21 ?0345 11/27/21 ?0809  ?NA 142   < > 144 143 142  --  141 143  --   ?K 4.5   < > 6.6* 7.0* 6.9* 6.1* 5.2* 5.0 5.0  ?CL 110  --   --  114*  --   --  112*  --   --   ?CO2 26  --   --  21*  --   --  20*  --   --   ?GLUCOSE 126*  --   --  285*  --   --  226*  --   --   ?BUN 30*  --   --  27*  --   --  34*  --   --   ?CREATININE 1.79*  --   --  2.11*  --   --  2.33*  --   --   ?CALCIUM 9.3  --   --  6.2*  --   --  6.7*  --   --   ?MG  --   --   --   --   --  1.3* 2.3  --   --   ?PHOS  --   --   --   --   --  4.1 2.9  --   --   ? < > = values in this interval not  displayed.  ? ?GFR: ?Estimated Creatinine Clearance: 43.3 mL/min (A) (by C-G formula based on SCr of 2.33 mg/dL (H)). ?Recent Labs  ?Lab 11/23/21 ?1430 11/26/21 ?  1815 11/26/21 ?2158 11/27/21 ?7824 11/27/21 ?2353  ?WBC 6.9 19.9* 24.5* 17.1*  --   ?LATICACIDVEN  --  5.3* 4.6*  --  3.2*  ? ? ?Liver Function Tests: ?Recent Labs  ?Lab 11/23/21 ?1430 11/26/21 ?1815 11/27/21 ?0328  ?AST '21 21 16  '$ ?ALT '15 11 8  '$ ?ALKPHOS 38 24* 24*  ?BILITOT 0.8 1.7* 0.8  ?PROT 7.0 4.4* 4.3*  ?ALBUMIN 3.7 2.6* 2.5*  ? ?No results for input(s): LIPASE, AMYLASE in the last 168 hours. ?No results for input(s): AMMONIA in the last 168 hours. ? ?ABG ?   ?Component Value Date/Time  ? PHART 7.298 (L) 11/27/2021 0345  ? PCO2ART 39.7 11/27/2021 0345  ? PO2ART 116 (H) 11/27/2021 0345  ? HCO3 19.5 (L) 11/27/2021 0345  ? TCO2 21 (L) 11/27/2021 0345  ? ACIDBASEDEF 7.0 (H) 11/27/2021 0345  ? O2SAT 98 11/27/2021 0345  ?  ? ?Coagulation Profile: ?Recent Labs  ?Lab 11/23/21 ?1430  ?INR 1.0  ? ? ?Cardiac Enzymes: ?Recent Labs  ?Lab 11/26/21 ?2158 11/27/21 ?0809  ?CKTOTAL 173 206  ? ? ?HbA1C: ?Hgb A1c MFr Bld  ?Date/Time Value Ref Range Status  ?11/27/2021 03:28 AM 5.6 4.8 - 5.6 % Final  ?  Comment:  ?  (NOTE) ?Pre diabetes:          5.7%-6.4% ? ?Diabetes:              >6.4% ? ?Glycemic control for   <7.0% ?adults with diabetes ?  ? ? ?CBG: ?Recent Labs  ?Lab 11/26/21 ?2346 11/27/21 ?6144 11/27/21 ?3154 11/27/21 ?0809 11/27/21 ?1128  ?GLUCAP 231* 208* 221* 212* 203*  ? ? ? ?CRITICAL CARE ?Performed by: Cristal Generous ? ? ?Total critical care time: 40 minutes ? ?Critical care time was exclusive of separately billable procedures and treating other patients. ?Critical care was necessary to treat or prevent imminent or life-threatening deterioration. ? ?Critical care was time spent personally by me on the following activities: development of treatment plan with patient and/or surrogate as well as nursing, discussions with consultants, evaluation of patient's  response to treatment, examination of patient, obtaining history from patient or surrogate, ordering and performing treatments and interventions, ordering and review of laboratory studies, ordering and review

## 2021-11-27 NOTE — Progress Notes (Signed)
PT Cancellation Note ? ?Patient Details ?Name: Kevin Gross ?MRN: [id number] ?DOB: 1956/03/05 ? ? ?Cancelled Treatment:    Reason Eval/Treat Not Completed: (P) Medical issues which prohibited therapy RN reports that when sedation lightened pt BP drops, request follow back tomorrow to determine appropriateness of Evaluation.  ? ?Neriah Brott B. Migdalia Dk PT, DPT ?Acute Rehabilitation Services ?Pager [phone] ?Office [phone] ? ? ? ?[place] ?11/27/2021, 8:49 AM ? ? ?

## 2021-11-27 NOTE — Progress Notes (Signed)
? ? ?  Subjective  - POD #1, status post extensive bilateral femoral endarterectomies with vein patch angioplasty and iliac stenting. ? ?Remains intubated. ? ? ?Physical Exam: ? ?Brisk pedal Doppler signals ?Prevena wound vacs in place without groin hematoma ?Left arm infiltrate with blistering and discoloration.  There is a audible radial Doppler signal. ?Intubated and sedated but does awake to command. ? ? ? ? ? ? ?Assessment/Plan:  POD #1 ? ?Vascular: The patient has undergone successful revascularization.  He had extensive blood loss yesterday and was brought back to the ICU intubated. ?Acute blood loss anemia: Hemoglobin is stable this morning at 13.3 following significant transfusion yesterday.  No current indications for transfusion ?Pulmonary: Patient remains intubated.  We will work towards extubation. ?Renal: The patient is making marginal urine output.  His creatinine increased to 2.3 today.  This will need to be closely followed. ?Hemodynamics: The patient still requires pressors for blood pressure support, which is being weaned ?ID: No active issues ?Left arm infiltrate.  The patient had a infiltrate of pressors into his left arm.  He was given phentolamine.  He does have skin discoloration and blistering.  This appears to be stable from last night.  There is no evidence of compartment syndrome.  The arm will be kept elevated. ?Prophylaxis: Protonix and subcu heparin ?We will start patient on Plavix given his iliac stents. ? ?Annamarie Major ?11/27/2021 ?10:53 AM ?-- ? ?Vitals:  ? 11/27/21 1000 11/27/21 1015  ?BP:    ?Pulse: 75 72  ?Resp: (!) 21 (!) 24  ?Temp: 97.7 ?F (36.5 ?C) 98.1 ?F (36.7 ?C)  ?SpO2: 94% 91%  ? ? ?Intake/Output Summary (Last 24 hours) at 11/27/2021 1053 ?Last data filed at 11/27/2021 1000 ?Gross per 24 hour  ?Intake 12305.07 ml  ?Output 2970 ml  ?Net 9335.07 ml  ? ? ? ?Laboratory ?CBC ?   ?Component Value Date/Time  ? WBC 17.1 (H) 11/27/2021 0328  ? HGB 12.2 (L) 11/27/2021 0345  ? HGB 13.3  10/12/2021 1314  ? HCT 36.0 (L) 11/27/2021 0345  ? HCT 39.4 10/12/2021 1314  ? PLT 136 (L) 11/27/2021 0328  ? PLT 175 10/12/2021 1314  ? ? ?BMET ?   ?Component Value Date/Time  ? NA 143 11/27/2021 0345  ? NA 145 (H) 10/12/2021 1314  ? K 5.0 11/27/2021 0809  ? CL 112 (H) 11/27/2021 0328  ? CO2 20 (L) 11/27/2021 0328  ? GLUCOSE 226 (H) 11/27/2021 0328  ? BUN 34 (H) 11/27/2021 0328  ? BUN 37 (H) 10/12/2021 1314  ? CREATININE 2.33 (H) 11/27/2021 0328  ? CALCIUM 6.7 (L) 11/27/2021 0328  ? GFRNONAA 30 (L) 11/27/2021 0328  ? GFRAA 56 (L) 10/26/2019 0551  ? ? ?COAG ?Lab Results  ?Component Value Date  ? INR 1.0 11/23/2021  ? ?No results found for: PTT ? ?Antibiotics ?Anti-infectives (From admission, onward)  ? ? Start     Dose/Rate Route Frequency Ordered Stop  ? 11/26/21 2100  ceFAZolin (ANCEF) IVPB 2g/100 mL premix       ? 2 g ?200 mL/hr over 30 Minutes Intravenous Every 8 hours 11/26/21 1757 11/27/21 0534  ? 11/26/21 0609  ceFAZolin (ANCEF) IVPB 3g/100 mL premix       ? 3 g ?200 mL/hr over 30 Minutes Intravenous 30 min pre-op 11/26/21 0609 11/26/21 1425  ? ?  ? ? ? ?V. Leia Alf, M.D., FACS ?[facility] ?Office: [phone] ?Pager:  [phone]  ?

## 2021-11-27 NOTE — Progress Notes (Signed)
OT Cancellation Note ? ?Patient Details ?Name: Kevin Gross ?MRN: [id number] ?DOB: June 22, 1956 ? ? ?Cancelled Treatment:    Reason Eval/Treat Not Completed: Medical issues which prohibited therapy.  OT to continue efforts as appropriate.   ? ?Maricia Scotti D Christinamarie Tall ?11/27/2021, 9:21 AM ?

## 2021-11-27 NOTE — Progress Notes (Signed)
Per RN, CCM requests wean trial.  After several minutes, noted pt vent alarming low volumes and noted pt desat to 86-84%.  Pt placed back on full vent support, sat now 98%, RN was in the room and aware.   ?

## 2021-11-28 DIAGNOSIS — I70219 Atherosclerosis of native arteries of extremities with intermittent claudication, unspecified extremity: Secondary | ICD-10-CM | POA: Diagnosis not present

## 2021-11-28 LAB — GLUCOSE, CAPILLARY
Glucose-Capillary: 112 mg/dL — ABNORMAL HIGH (ref 70–99)
Glucose-Capillary: 115 mg/dL — ABNORMAL HIGH (ref 70–99)
Glucose-Capillary: 125 mg/dL — ABNORMAL HIGH (ref 70–99)
Glucose-Capillary: 126 mg/dL — ABNORMAL HIGH (ref 70–99)
Glucose-Capillary: 142 mg/dL — ABNORMAL HIGH (ref 70–99)
Glucose-Capillary: 144 mg/dL — ABNORMAL HIGH (ref 70–99)
Glucose-Capillary: 155 mg/dL — ABNORMAL HIGH (ref 70–99)
Glucose-Capillary: 191 mg/dL — ABNORMAL HIGH (ref 70–99)

## 2021-11-28 LAB — CBC WITH DIFFERENTIAL/PLATELET
Abs Immature Granulocytes: 0.13 10*3/uL — ABNORMAL HIGH (ref 0.00–0.07)
Basophils Absolute: 0 10*3/uL (ref 0.0–0.1)
Basophils Relative: 0 %
Eosinophils Absolute: 0 10*3/uL (ref 0.0–0.5)
Eosinophils Relative: 0 %
HCT: 28.9 % — ABNORMAL LOW (ref 39.0–52.0)
Hemoglobin: 9.6 g/dL — ABNORMAL LOW (ref 13.0–17.0)
Immature Granulocytes: 1 %
Lymphocytes Relative: 6 %
Lymphs Abs: 0.9 10*3/uL (ref 0.7–4.0)
MCH: 30.1 pg (ref 26.0–34.0)
MCHC: 33.2 g/dL (ref 30.0–36.0)
MCV: 90.6 fL (ref 80.0–100.0)
Monocytes Absolute: 1.3 10*3/uL — ABNORMAL HIGH (ref 0.1–1.0)
Monocytes Relative: 8 %
Neutro Abs: 14.3 10*3/uL — ABNORMAL HIGH (ref 1.7–7.7)
Neutrophils Relative %: 85 %
Platelets: 86 10*3/uL — ABNORMAL LOW (ref 150–400)
RBC: 3.19 MIL/uL — ABNORMAL LOW (ref 4.22–5.81)
RDW: 19.9 % — ABNORMAL HIGH (ref 11.5–15.5)
WBC: 16.7 10*3/uL — ABNORMAL HIGH (ref 4.0–10.5)
nRBC: 0 % (ref 0.0–0.2)

## 2021-11-28 LAB — BASIC METABOLIC PANEL
Anion gap: 7 (ref 5–15)
BUN: 40 mg/dL — ABNORMAL HIGH (ref 8–23)
CO2: 24 mmol/L (ref 22–32)
Calcium: 7 mg/dL — ABNORMAL LOW (ref 8.9–10.3)
Chloride: 110 mmol/L (ref 98–111)
Creatinine, Ser: 1.84 mg/dL — ABNORMAL HIGH (ref 0.61–1.24)
GFR, Estimated: 40 mL/min — ABNORMAL LOW (ref >60)
Glucose, Bld: 153 mg/dL — ABNORMAL HIGH (ref 70–99)
Potassium: 4.3 mmol/L (ref 3.5–5.1)
Sodium: 141 mmol/L (ref 135–145)

## 2021-11-28 LAB — CBC
HCT: 27.2 % — ABNORMAL LOW (ref 39.0–52.0)
Hemoglobin: 9.3 g/dL — ABNORMAL LOW (ref 13.0–17.0)
MCH: 30.5 pg (ref 26.0–34.0)
MCHC: 34.2 g/dL (ref 30.0–36.0)
MCV: 89.2 fL (ref 80.0–100.0)
Platelets: 97 10*3/uL — ABNORMAL LOW (ref 150–400)
RBC: 3.05 MIL/uL — ABNORMAL LOW (ref 4.22–5.81)
RDW: 20.1 % — ABNORMAL HIGH (ref 11.5–15.5)
WBC: 21 10*3/uL — ABNORMAL HIGH (ref 4.0–10.5)
nRBC: 0.1 % (ref 0.0–0.2)

## 2021-11-28 LAB — PHOSPHORUS: Phosphorus: 2.8 mg/dL (ref 2.5–4.6)

## 2021-11-28 LAB — MAGNESIUM: Magnesium: 2.1 mg/dL (ref 1.7–2.4)

## 2021-11-28 MED ORDER — FUROSEMIDE 10 MG/ML IJ SOLN
40.0000 mg | Freq: Once | INTRAMUSCULAR | Status: AC
  Administered 2021-11-28: 40 mg via INTRAVENOUS
  Filled 2021-11-28: qty 4

## 2021-11-28 MED ORDER — FUROSEMIDE 10 MG/ML IJ SOLN: 40.0000 mg | Freq: Once | INTRAMUSCULAR | Status: DC

## 2021-11-28 NOTE — Progress Notes (Incomplete)
Patient information guide "Understanding Peripheral Artery Disease" and patient information card for Balloon Expandable Endoprosthesis given to patient.  ?

## 2021-11-28 NOTE — Progress Notes (Signed)
? ?NAME:  Kevin Gross, MRN:  [id number], DOB:  March 23, 1956, LOS: 2 ?ADMISSION DATE:  11/26/2021, CONSULTATION DATE:  11/26/2021 ?REFERRING MD:  Harold Barban  CHIEF COMPLAINT:  Post op ? ?History of Present Illness:  ?66 year old male with PVD, CAD, COPD, OSA, liver cirrhosis, hyperlipidemia, hypertension, pre-diabetes admitted with bilateral femoral endarterectomy with patch angioplasty and bilateral iliac stenting with infrarenal lithotripsy requiring ICU post procedure. ? ?Patient presented today for above procedure.  Intubated for procedure without difficulty. Intra-op: EBL: 2.5 L. Patient received: 2.5 L PRBC, 5. 5 L NS, 1 L Albumin.  Net balance + 6.9 L.  Patient required levophed and dopamine during the case, with soft tissue infiltration. S/p phentolamine.  Post-op, patient is returning to the ICU intubated.  ? ?On arrival to ICU, he was sedated on MV, on levophed 30. ? ?Pertinent  Medical History  ? ?Past Medical History:  ?Diagnosis Date  ? Alcoholic cirrhosis of liver ([place])   ? CHF (congestive heart failure) ([place])   ? Clotting disorder ([place])   ? Bilateral legs-pt states plaque build up in both legs requiring intervention  ? Complication of anesthesia   ? pt states that years ago during colonoscopy at [facility] they had to abort the procedure due to some "cardiac issue" had to see cardiologist  ? COPD (chronic obstructive pulmonary disease) ([place])   ? Hyperlipidemia   ? Hypertension   ? Pre-diabetes   ? Sleep apnea   ? uses cpap  ? ? ? ?Significant Hospital Events: ?Including procedures, antibiotic start and stop dates in addition to other pertinent events   ?3/31: Admit ?3/31: Intubated for OR: s/p  bilateral femoral endarterectomy with patch angioplasty and bilateral iliac stenting with infrarenal lithotripsy ?4/1 incr pressor requirement. ? ?Interim History / Subjective:  ? ?No acute events overnight ?Tolerated SBT this morning and was extubated ? ?Objective   ?Blood pressure 134/66, pulse 83, temperature  98.4 ?F (36.9 ?C), resp. rate (!) 9, height '5\' 8"'$  (1.727 m), weight 136.1 kg, SpO2 100 %. ?CVP:  [0 mmHg-12 mmHg] 12 mmHg  ?Vent Mode: PSV;CPAP ?FiO2 (%):  [40 %] 40 % ?Set Rate:  [24 bmp] 24 bmp ?Vt Set:  [560 mL] 560 mL ?PEEP:  [5 cmH20] 5 cmH20 ?Pressure Support:  [5 cmH20-8 cmH20] 5 cmH20 ?Plateau Pressure:  [19 cmH20-20 cmH20] 20 cmH20  ? ?Intake/Output Summary (Last 24 hours) at 11/28/2021 1220 ?Last data filed at 11/28/2021 1100 ?Gross per 24 hour  ?Intake 4901.65 ml  ?Output 1935 ml  ?Net 2966.65 ml  ? ?Filed Weights  ? 11/26/21 8469  ?Weight: 136.1 kg  ? ?Physical Exam: ?General: Critically ill appearing obese older adult M intubated, no acute distress ?HENT: NCAT pink mm ETT secure  ?Respiratory: CTAb , symmetrical chest expansion. Mechanically ventilated  ?Cardiovascular: rrr s1s2 no rgm  ?GI: Obese soft ndnt  ?Neuro: alert awake, follows commands, moving all extremities ?GU: Foley yellow urine  ?Extremities: + pulses BUE BLE. No acute joint deformity  ?Skin: LUE purple discoloration and overlying blistering, mixed open and closed.  ? ?LUE 3/31: ? ? ? ?Resolved Hospital Problem list   ? ? ?Assessment & Plan:  ? ? ?PAD s/p bilat femoral endarterectomy with patch angioplasty, bilat iliac stenting with infrarenal lithotripsy  ?-Vascular surgery primary ?-Goal BP: Normotension ?-Post-op management per primary team ? ?Acute respiratory failure with hypoxia ?Hx COPD ?-high volume resuscitation in OR ?P ?-extubated today ?-PRN albuterol  ?- given '40mg'$  lasix prior to extubation ? ?Hypovolemic  shock - resolved ?-high volume intraop blood loss. Hgb now stable but remains intravascularly down.  ?-TTE on 11/08/2021: LVEF 55-60%, no regional wall motion abnormalities, LVH. RV normal function and mildly enlarged. Ascending aorta 40 mm.  ?P ?-stop fluids today ?-weaned off pressors ? ?Acute blood loss anemia  ?P ?-trend H/H transfuse as needed  ? ?LUE soft tissue infiltration of vasopressors ?P ?-SSD  ?-closely follow  pulses, demarcation of wound ? ?CAD ?HLD ?HTN ?-zetia  ?-holding antihypertensives with shock  ? ?AKI on CKD III ?Hyperkalemia  ?-Baseline creatinine 1.7 ?P ?-foley in place, will remove today if able ?-trend renal indices UOP  ?-K correction as needed  ? ?Hypocalcemia ?-blood resusc ?P ?-monitor ? ?Hyperglycemia ?P ?-continue SSI ? ?Alcoholic cirrhosis  ?-PRN LFTs  ? ? ?Best Practice (right click and "Reselect all SmartList Selections" daily)  ? ?Diet/type: NPO Adv EN as tolerated to goal  ?DVT prophylaxis: other ?GI prophylaxis: PPI ?Lines: Arterial Line and yes and it is still needed ?Foley:  Yes, and it is still needed ?Code Status:  full code ?Last date of multidisciplinary goals of care discussion '[]'$  Per primary team ? ?Labs   ?CBC: ?Recent Labs  ?Lab 11/26/21 ?1815 11/26/21 ?1955 11/26/21 ?2158 11/27/21 ?0328 11/27/21 ?0345 11/28/21 ?4696 11/28/21 ?1151  ?WBC 19.9*  --  24.5* 17.1*  --  21.0* 16.7*  ?NEUTROABS  --   --   --   --   --   --  14.3*  ?HGB 13.3   < > 14.2 13.2 12.2* 9.3* 9.6*  ?HCT 39.1   < > 41.3 38.6* 36.0* 27.2* 28.9*  ?MCV 89.3  --  87.5 87.1  --  89.2 90.6  ?PLT 142*  --  143* 136*  --  97* 86*  ? < > = values in this interval not displayed.  ? ? ?Basic Metabolic Panel: ?Recent Labs  ?Lab 11/23/21 ?1430 11/26/21 ?1046 11/26/21 ?1815 11/26/21 ?1955 11/26/21 ?2158 11/27/21 ?0328 11/27/21 ?0345 11/27/21 ?0809 11/27/21 ?1519 11/28/21 ?2952  ?NA 142   < > 143 142  --  141 143  --   --  141  ?K 4.5   < > 7.0* 6.9* 6.1* 5.2* 5.0 5.0  --  4.3  ?CL 110  --  114*  --   --  112*  --   --   --  110  ?CO2 26  --  21*  --   --  20*  --   --   --  24  ?GLUCOSE 126*  --  285*  --   --  226*  --   --   --  153*  ?BUN 30*  --  27*  --   --  34*  --   --   --  40*  ?CREATININE 1.79*  --  2.11*  --   --  2.33*  --   --   --  1.84*  ?CALCIUM 9.3  --  6.2*  --   --  6.7*  --   --   --  7.0*  ?MG  --   --   --   --  1.3* 2.3  --   --  2.0 2.1  ?PHOS  --   --   --   --  4.1 2.9  --   --  3.1 2.8  ? < > = values in  this interval not displayed.  ? ?GFR: ?Estimated Creatinine Clearance: 54.8 mL/min (A) (by C-G formula based  on SCr of 1.84 mg/dL (H)). ?Recent Labs  ?Lab 11/26/21 ?1815 11/26/21 ?2158 11/27/21 ?9604 11/27/21 ?5409 11/28/21 ?8119 11/28/21 ?1151  ?WBC 19.9* 24.5* 17.1*  --  21.0* 16.7*  ?LATICACIDVEN 5.3* 4.6*  --  3.2*  --   --   ? ? ?Liver Function Tests: ?Recent Labs  ?Lab 11/23/21 ?1430 11/26/21 ?1815 11/27/21 ?0328  ?AST '21 21 16  '$ ?ALT '15 11 8  '$ ?ALKPHOS 38 24* 24*  ?BILITOT 0.8 1.7* 0.8  ?PROT 7.0 4.4* 4.3*  ?ALBUMIN 3.7 2.6* 2.5*  ? ?No results for input(s): LIPASE, AMYLASE in the last 168 hours. ?No results for input(s): AMMONIA in the last 168 hours. ? ?ABG ?   ?Component Value Date/Time  ? PHART 7.298 (L) 11/27/2021 0345  ? PCO2ART 39.7 11/27/2021 0345  ? PO2ART 116 (H) 11/27/2021 0345  ? HCO3 19.5 (L) 11/27/2021 0345  ? TCO2 21 (L) 11/27/2021 0345  ? ACIDBASEDEF 7.0 (H) 11/27/2021 0345  ? O2SAT 98 11/27/2021 0345  ?  ? ?Coagulation Profile: ?Recent Labs  ?Lab 11/23/21 ?1430  ?INR 1.0  ? ? ?Cardiac Enzymes: ?Recent Labs  ?Lab 11/26/21 ?2158 11/27/21 ?0809  ?CKTOTAL 173 206  ? ? ?HbA1C: ?Hgb A1c MFr Bld  ?Date/Time Value Ref Range Status  ?11/27/2021 03:28 AM 5.6 4.8 - 5.6 % Final  ?  Comment:  ?  (NOTE) ?Pre diabetes:          5.7%-6.4% ? ?Diabetes:              >6.4% ? ?Glycemic control for   <7.0% ?adults with diabetes ?  ? ? ?CBG: ?Recent Labs  ?Lab 11/27/21 ?2323 11/28/21 ?1478 11/28/21 ?0350 11/28/21 ?2956 11/28/21 ?1135  ?GLUCAP 121* 142* [place]  ? ? ? ?CRITICAL CARE ?Performed by: Freddi Starr ? ? ?Total critical care time: 35 minutes ? ?Critical care time was exclusive of separately billable procedures and treating other patients. ?Critical care was necessary to treat or prevent imminent or life-threatening deterioration. ? ?Critical care was time spent personally by me on the following activities: development of treatment plan with patient and/or surrogate as well as nursing,  discussions with consultants, evaluation of patient's response to treatment, examination of patient, obtaining history from patient or surrogate, ordering and performing treatments and interventions, ordering and review

## 2021-11-28 NOTE — Procedures (Signed)
Extubation Procedure Note ? ?Patient Details:   ?Name: Kevin Gross ?DOB: 03/12/1956 ?MRN: [id number] ?  ?Airway Documentation:  ?  ?Vent end date: 11/28/21 Vent end time: 0093  ? ?Evaluation ? O2 sats: stable throughout and currently acceptable ?Complications: No apparent complications ?Patient did tolerate procedure well. ?Bilateral Breath Sounds: Clear, Diminished ?  ?Yes ? ?Patient was extubated to a 4L [place]. Cuff leak was heard. No stridor was noted. RN was at the bedside with RT during extubation. Patient tolerated well. ? ?Tiburcio Bash ?11/28/2021, 10:23 AM ? ?

## 2021-11-28 NOTE — Progress Notes (Signed)
? ? ?  Subjective  - POD #2, status post extensive bilateral femoral endarterectomy with vein patch angioplasty and iliac stenting ? ?Remains intubated ? ? ?Physical Exam: ? ?Bilateral lower extremity Doppler signals. ?Prevena wound VAC in each groin with good seal.  No hematoma. ?No significant changes in left forearm infiltration site ?Moving all 4 extremities to command ? ? ? ? ? ? ?Assessment/Plan:  POD #2 ? ?Renal: He has good urine output and his creatinine has trended down. ?Acute blood loss anemia.  Slight decrease in hemoglobin today.  I will continue to monitor this.  No indication for transfusion. ?Hemodynamics: Continue to wean pressors.  He did respond well to volume. ?Pulmonary: Plan for extubation today ?Prophylaxis: Subcu heparin and Protonix ?ID: White blood cell count up to 21,000.  I suspect this is demargination.  We will continue to monitor him for fevers or infection. ? ?Annamarie Major ?11/28/2021 ?10:25 AM ?-- ? ?Vitals:  ? 11/28/21 0945 11/28/21 1000  ?BP:    ?Pulse: 81 81  ?Resp: 10 (!) 8  ?Temp: 98.2 ?F (36.8 ?C) 98.4 ?F (36.9 ?C)  ?SpO2: 100% 100%  ? ? ?Intake/Output Summary (Last 24 hours) at 11/28/2021 1025 ?Last data filed at 11/28/2021 1000 ?Gross per 24 hour  ?Intake 5525.72 ml  ?Output 1685 ml  ?Net 3840.72 ml  ? ? ? ?Laboratory ?CBC ?   ?Component Value Date/Time  ? WBC 21.0 (H) 11/28/2021 0317  ? HGB 9.3 (L) 11/28/2021 0317  ? HGB 13.3 10/12/2021 1314  ? HCT 27.2 (L) 11/28/2021 0317  ? HCT 39.4 10/12/2021 1314  ? PLT 97 (L) 11/28/2021 0317  ? PLT 175 10/12/2021 1314  ? ? ?BMET ?   ?Component Value Date/Time  ? NA 141 11/28/2021 0317  ? NA 145 (H) 10/12/2021 1314  ? K 4.3 11/28/2021 0317  ? CL 110 11/28/2021 0317  ? CO2 24 11/28/2021 0317  ? GLUCOSE 153 (H) 11/28/2021 0317  ? BUN 40 (H) 11/28/2021 0317  ? BUN 37 (H) 10/12/2021 1314  ? CREATININE 1.84 (H) 11/28/2021 0317  ? CALCIUM 7.0 (L) 11/28/2021 0317  ? GFRNONAA 40 (L) 11/28/2021 0317  ? GFRAA 56 (L) 10/26/2019 0551  ? ? ?COAG ?Lab  Results  ?Component Value Date  ? INR 1.0 11/23/2021  ? ?No results found for: PTT ? ?Antibiotics ?Anti-infectives (From admission, onward)  ? ? Start     Dose/Rate Route Frequency Ordered Stop  ? 11/26/21 2100  ceFAZolin (ANCEF) IVPB 2g/100 mL premix       ? 2 g ?200 mL/hr over 30 Minutes Intravenous Every 8 hours 11/26/21 1757 11/27/21 0534  ? 11/26/21 0609  ceFAZolin (ANCEF) IVPB 3g/100 mL premix       ? 3 g ?200 mL/hr over 30 Minutes Intravenous 30 min pre-op 11/26/21 0609 11/26/21 1425  ? ?  ? ? ? ?V. Leia Alf, M.D., FACS ?[facility]  ?Office: [phone] ?Pager:  [phone]  ?

## 2021-11-28 NOTE — Evaluation (Signed)
Physical Therapy Evaluation ?Patient Details ?Name: Kevin Gross ?MRN: [id number] ?DOB: Oct 03, 1955 ?Today's Date: 11/28/2021 ? ?History of Present Illness ? Pt is a 66 y.o. male who presented 11/26/21 for bil femoral endarterectomy with patch angioplasty, bil iliac stenting, and bil infrarenal lithtripsy. Pt experienced extensive blood loss and was borught to the ICU intubated. ETT 3/31 - 4/2. PMH: alcoholic cirrhosis of liver, CHF, COPD, HLD, HTN, sleep apnea ?  ?Clinical Impression ? Pt presents with condition above and deficits mentioned below, see PT Problem List. PTA, he was IND without DME, living with his wife and adult daughters in a 1-level house with 3 STE. Pt displays edema in his scrotum, impacting his stance and ease with ambulating this date. Created and provided a scrotal hammock to lift it during mobility with noted success. Elevated legs and scrotum end of session to address the edema, RN aware. Pt with noted functional weakness in his legs and decreased balance and activity tolerance. Pt was able to perform transfers with minA and ambulate up to ~100 ft with the [facility] walker with min guard-minA. Pt would benefit from follow-up with HHPT to maximize his return to baseline at this time. However, if pt progresses quickly, he may not need follow-up PT at d/c. Will continue to follow acutely. ?   ? ?Recommendations for follow up therapy are one component of a multi-disciplinary discharge planning process, led by the attending physician.  Recommendations may be updated based on patient status, additional functional criteria and insurance authorization. ? ?Follow Up Recommendations Home health PT (vs no PT pending progress) ? ?  ?Assistance Recommended at Discharge Intermittent Supervision/Assistance  ?Patient can return home with the following ? A little help with walking and/or transfers;A little help with bathing/dressing/bathroom;Assistance with cooking/housework;Assist for transportation;Help with stairs  or ramp for entrance ? ?  ?Equipment Recommendations None recommended by PT  ?Recommendations for Other Services ?    ?  ?Functional Status Assessment Patient has had a recent decline in their functional status and demonstrates the ability to make significant improvements in function in a reasonable and predictable amount of time.  ? ?  ?Precautions / Restrictions Precautions ?Precautions: Fall;Other (comment) ?Precaution Comments: A-line; watch SpO2; bil groin wound vac; scrotal edema ?Restrictions ?Weight Bearing Restrictions: No  ? ?  ? ?Mobility ? Bed Mobility ?Overal bed mobility: Needs Assistance ?Bed Mobility: Sit to Supine ?  ?  ?  ?Sit to supine: Min assist ?  ?General bed mobility comments: MinA to lift legs onto bed for return to supine. ?  ? ?Transfers ?Overall transfer level: Needs assistance ?Equipment used: 1 person hand held assist [place]on Pier walker) ?Transfers: Sit to/from Stand, Bed to chair/wheelchair/BSC ?Sit to Stand: Min assist ?  ?Step pivot transfers: Min assist ?  ?  ?  ?General transfer comment: Sit to stand from recliner to PT anterior to him with cues to hold onto therapist with L UE, minA to power up to stand and steady with stand step to L recliner > commode. Light minA to power up to stand from commode to [place] walker with cues for hand placement. ?  ? ?Ambulation/Gait ?Ambulation/Gait assistance: Min assist, Min guard ?Gait Distance (Feet): 100 Feet (x2 bouts of ~3 ft > ~100 ft) ?Assistive device: Ethelene Hal ?Gait Pattern/deviations: Step-through pattern, Decreased stride length, Wide base of support, Trunk flexed ?Gait velocity: reduced ?Gait velocity interpretation: <1.31 ft/sec, indicative of household ambulator ?  ?General Gait Details: Pt with flexed posture and wide BOS, likely primarily due  to trying to provide space for swollen scrotum. Applied a sling made from bandaging wrapped around his waist to assist in lifting scrotum, pt reporting good success and noted improved ease with  ambulating. Light minA-min guard for safety and steadying with gait. ? ?Stairs ?  ?  ?  ?  ?  ? ?Wheelchair Mobility ?  ? ?Modified Rankin (Stroke Patients Only) ?  ? ?  ? ?Balance Overall balance assessment: Needs assistance ?Sitting-balance support: No upper extremity supported, Feet supported ?Sitting balance-Leahy Scale: Fair ?  ?  ?Standing balance support: Bilateral upper extremity supported, During functional activity, Single extremity supported ?Standing balance-Leahy Scale: Poor ?Standing balance comment: Reliant on UE support ?  ?  ?  ?  ?  ?  ?  ?  ?  ?  ?  ?   ? ? ? ?Pertinent Vitals/Pain Pain Assessment ?Pain Assessment: No/denies pain  ? ? ?Home Living Family/patient expects to be discharged to:: Private residence ?Living Arrangements: Spouse/significant other;Children (daughter is medically trained, wife is unable to assist much) ?Available Help at Discharge: Family;Available 24 hours/day ?Type of Home: House ?Home Access: Stairs to enter ?Entrance Stairs-Rails: Can reach both ?Entrance Stairs-Number of Steps: 3 ?  ?Home Layout: One level ?Home Equipment: Rollator (4 wheels);Other (comment);Wheelchair - manual (hurrycane) ?Additional Comments: Pt is caregiver for wife who has COPD  ?  ?Prior Function Prior Level of Function : Independent/Modified Independent;Driving ?  ?  ?  ?  ?  ?  ?Mobility Comments: Does not use AD. No falls. ?ADLs Comments: Was caregiver for his wife who has COPD, taking care of her O2 and doing the shopping, cleaning, cooking etc. Does not have to physically assist his wife for mobility or ADLs. ?  ? ? ?Hand Dominance  ? Dominant Hand: Right ? ?  ?Extremity/Trunk Assessment  ? Upper Extremity Assessment ?Upper Extremity Assessment: Defer to OT evaluation ?  ? ?Lower Extremity Assessment ?Lower Extremity Assessment: Generalized weakness (noted functionally; reports slight numbness but improved sensation overall compared to prior to surgery) ?  ? ?Cervical / Trunk  Assessment ?Cervical / Trunk Assessment: Other exceptions ?Cervical / Trunk Exceptions: increased body habitus  ?Communication  ? Communication: No difficulties  ?Cognition Arousal/Alertness: Awake/alert ?Behavior During Therapy: [facility] for tasks assessed/performed ?Overall Cognitive Status: Within Functional Limits for tasks assessed ?  ?  ?  ?  ?  ?  ?  ?  ?  ?  ?  ?  ?  ?  ?  ?  ?  ?  ?  ? ?  ?General Comments General comments (skin integrity, edema, etc.): elevated legs and scrotum end of session supine in bed, used sling to elevate scrotum when ambulating with good success; VSS on up to 6L O2 via [place] ? ?  ?Exercises    ? ?Assessment/Plan  ?  ?PT Assessment Patient needs continued PT services  ?PT Problem List Decreased strength;Decreased activity tolerance;Decreased balance;Decreased mobility;Cardiopulmonary status limiting activity;Impaired sensation;Decreased skin integrity ? ?   ?  ?PT Treatment Interventions DME instruction;Gait training;Stair training;Functional mobility training;Therapeutic activities;Therapeutic exercise;Balance training;Neuromuscular re-education;Patient/family education   ? ?PT Goals (Current goals can be found in the Care Plan section)  ?Acute Rehab PT Goals ?Patient Stated Goal: to improve ?PT Goal Formulation: With patient ?Time For Goal Achievement: 12/12/21 ?Potential to Achieve Goals: Good ? ?  ?Frequency Min 3X/week ?  ? ? ?Co-evaluation   ?  ?  ?  ?  ? ? ?  ?AM-PAC PT "6 Clicks"  Mobility  ?Outcome Measure Help needed turning from your back to your side while in a flat bed without using bedrails?: A Little ?Help needed moving from lying on your back to sitting on the side of a flat bed without using bedrails?: A Little ?Help needed moving to and from a bed to a chair (including a wheelchair)?: A Little ?Help needed standing up from a chair using your arms (e.g., wheelchair or bedside chair)?: A Little ?Help needed to walk in hospital room?: A Little ?Help needed climbing 3-5 steps  with a railing? : A Lot ?6 Click Score: 17 ? ?  ?End of Session Equipment Utilized During Treatment: Oxygen ?Activity Tolerance: Patient tolerated treatment well ?Patient left: in bed;with call bell/phone within reach;wi

## 2021-11-28 NOTE — Progress Notes (Signed)
10 mL fentanyl wasted with Candiss Norse RN as witness. ?

## 2021-11-29 ENCOUNTER — Encounter (HOSPITAL_COMMUNITY): Payer: Self-pay | Admitting: Surgery

## 2021-11-29 LAB — CBC
HCT: 26.5 % — ABNORMAL LOW (ref 39.0–52.0)
Hemoglobin: 8.6 g/dL — ABNORMAL LOW (ref 13.0–17.0)
MCH: 30.1 pg (ref 26.0–34.0)
MCHC: 32.5 g/dL (ref 30.0–36.0)
MCV: 92.7 fL (ref 80.0–100.0)
Platelets: 84 10*3/uL — ABNORMAL LOW (ref 150–400)
RBC: 2.86 MIL/uL — ABNORMAL LOW (ref 4.22–5.81)
RDW: 19.7 % — ABNORMAL HIGH (ref 11.5–15.5)
WBC: 11.6 10*3/uL — ABNORMAL HIGH (ref 4.0–10.5)
nRBC: 0.2 % (ref 0.0–0.2)

## 2021-11-29 LAB — GLUCOSE, CAPILLARY
Glucose-Capillary: 99 mg/dL (ref 70–99)
Glucose-Capillary: 105 mg/dL — ABNORMAL HIGH (ref 70–99)
Glucose-Capillary: 97 mg/dL (ref 70–99)
Glucose-Capillary: 77 mg/dL (ref 70–99)
Glucose-Capillary: 86 mg/dL (ref 70–99)
Glucose-Capillary: 81 mg/dL (ref 70–99)

## 2021-11-29 LAB — BASIC METABOLIC PANEL
Anion gap: 5 (ref 5–15)
BUN: 46 mg/dL — ABNORMAL HIGH (ref 8–23)
CO2: 26 mmol/L (ref 22–32)
Calcium: 7.4 mg/dL — ABNORMAL LOW (ref 8.9–10.3)
Chloride: 110 mmol/L (ref 98–111)
Creatinine, Ser: 1.79 mg/dL — ABNORMAL HIGH (ref 0.61–1.24)
GFR, Estimated: 42 mL/min — ABNORMAL LOW (ref >60)
Glucose, Bld: 108 mg/dL — ABNORMAL HIGH (ref 70–99)
Potassium: 4.1 mmol/L (ref 3.5–5.1)
Sodium: 141 mmol/L (ref 135–145)

## 2021-11-29 LAB — HEPARIN LEVEL (UNFRACTIONATED): Heparin Unfractionated: 0.36 IU/mL (ref 0.30–0.70)

## 2021-11-29 LAB — MAGNESIUM: Magnesium: 2.3 mg/dL (ref 1.7–2.4)

## 2021-11-29 MED ORDER — HEPARIN (PORCINE) 25000 UT/250ML-% IV SOLN
1750.0000 [IU]/h | INTRAVENOUS | Status: DC
  Administered 2021-11-29 – 2021-11-30 (×2): 1200 [IU]/h via INTRAVENOUS
  Administered 2021-12-01: 1550 [IU]/h via INTRAVENOUS
  Administered 2021-12-01: 1400 [IU]/h via INTRAVENOUS
  Administered 2021-12-02 – 2021-12-03 (×2): 1550 [IU]/h via INTRAVENOUS
  Filled 2021-11-29 (×6): qty 250

## 2021-11-29 MED ORDER — SILVER SULFADIAZINE 1 % EX CREA
TOPICAL_CREAM | Freq: Two times a day (BID) | CUTANEOUS | Status: DC
  Administered 2021-12-05: 1 via TOPICAL
  Filled 2021-11-29 (×2): qty 85

## 2021-11-29 NOTE — Plan of Care (Signed)
?  Problem: Clinical Measurements: Goal: Respiratory complications will improve Outcome: Progressing Goal: Cardiovascular complication will be avoided Outcome: Progressing   Problem: Elimination: Goal: Will not experience complications related to bowel motility Outcome: Progressing   Problem: Pain Managment: Goal: General experience of comfort will improve Outcome: Progressing   

## 2021-11-29 NOTE — Evaluation (Signed)
Occupational Therapy Evaluation ?Patient Details ?Name: Kevin Gross ?MRN: [id number] ?DOB: [birth year] ?Today's Date: 11/29/2021 ? ? ?History of Present Illness Pt is a 66 y.o. male who presented 11/26/21 for bil femoral endarterectomy with patch angioplasty, bil iliac stenting, and bil infrarenal lithtripsy. Pt experienced extensive blood loss and was borught to the ICU intubated. ETT 3/31 - 4/2. PMH: alcoholic cirrhosis of liver, CHF, COPD, HLD, HTN, sleep apnea  ? ?Clinical Impression ?  ?Pt is typically independent in ADL and mobility. He is the caregiver for his wife, performing all IADL including driving, shopping, cooking, cleaning. Within the past 3 weeks has had to use the motorized shopping cart at the store.  Typically bathes in walk in shower but has access to tub with bench as his son in law is an amputee. Today's session focused on fabrication of scrotal sling, education for Pt and RN staff on how to don/doff/clean and wearing schedule. Pt then min A +2 for sit<>stand x3 from bed and min A for transfer to recliner with 1 person HHA. Pt mod to max A for LB ADL this time (limited by pain in groin at incision/wound vac). Pt will benefit from skilled OT in the acute setting as well as afterwards at the [facility] level. Next session to focus on OOB activity, access to LB for ADL with AE education.  ?   ? ?Recommendations for follow up therapy are one component of a multi-disciplinary discharge planning process, led by the attending physician.  Recommendations may be updated based on patient status, additional functional criteria and insurance authorization.  ? ?Follow Up Recommendations ? Home health OT  ?  ?Assistance Recommended at Discharge Intermittent Supervision/Assistance  ?Patient can return home with the following A little help with walking and/or transfers;A lot of help with bathing/dressing/bathroom;Assistance with cooking/housework;Assist for transportation;Help with stairs or ramp for entrance ? ?   ?Functional Status Assessment ? Patient has had a recent decline in their functional status and demonstrates the ability to make significant improvements in function in a reasonable and predictable amount of time.  ?Equipment Recommendations ? BSC/3in1 (WIDE)  ?  ?Recommendations for Other Services PT consult ? ? ?  ?Precautions / Restrictions Precautions ?Precautions: Fall;Other (comment) ?Precaution Comments: A-line; watch SpO2; bil groin wound vac; scrotal edema ?Restrictions ?Weight Bearing Restrictions: No  ? ?  ? ?Mobility Bed Mobility ?Overal bed mobility: Needs Assistance ?Bed Mobility: Supine to Sit ?  ?  ?Supine to sit: Min assist, HOB elevated ?  ?  ?General bed mobility comments: min A for trunk elevation, extra time for movements ?  ? ?Transfers ?Overall transfer level: Needs assistance ?Equipment used: 1 person hand held assist ?Transfers: Sit to/from Stand, Bed to chair/wheelchair/BSC ?Sit to Stand: Min assist ?  ?  ?Step pivot transfers: Min assist ?  ?  ?General transfer comment: Sit to stand from bed with Light minA to power up to stand x 3, groin site painful with transition, able to take pivotal steps to the recliner, Pt would have been able to do more, but session focused on fabrication of scrotal sling with education for patient and RN staff ?  ? ?  ?Balance Overall balance assessment: Needs assistance ?Sitting-balance support: No upper extremity supported, Feet supported ?Sitting balance-Leahy Scale: Fair ?  ?  ?Standing balance support: Bilateral upper extremity supported, During functional activity, Single extremity supported ?Standing balance-Leahy Scale: Fair ?Standing balance comment: for small steps ?  ?  ?  ?  ?  ?  ?  ?  ?  ?  ?  ?   ? ?  ADL either performed or assessed with clinical judgement  ? ?ADL Overall ADL's : Needs assistance/impaired ?Eating/Feeding: Set up ?  ?Grooming: Set up;Sitting ?  ?Upper Body Bathing: Set up;Sitting ?  ?Lower Body Bathing: Moderate assistance ?Lower  Body Bathing Details (indicate cue type and reason): knees down ?Upper Body Dressing : Minimal assistance ?  ?Lower Body Dressing: Moderate assistance ?  ?Toilet Transfer: Minimal assistance;BSC/3in1;Stand-pivot ?  ?Toileting- Clothing Manipulation and Hygiene: Moderate assistance;Sit to/from stand ?  ?Tub/ Shower Transfer: Walk-in shower ?Tub/Shower Transfer Details (indicate cue type and reason): Pt also has access to tub bench and tub for bathing at home ?Functional mobility during ADLs: Minimal assistance (SPT only) ?General ADL Comments: assist for knees down for ADL due to pain with bending, uable to perform figure 4, decreased activity tolerance and balance. typically does all IADL for wife and himself  ? ? ? ?Vision Baseline Vision/History: 1 Wears glasses ?Ability to See in Adequate Light: 0 Adequate ?Patient Visual Report: No change from baseline ?   ?   ?Perception   ?  ?Praxis   ?  ? ?Pertinent Vitals/Pain Pain Assessment ?Pain Assessment: 0-10 ?Pain Score: 5  ?Pain Location: groin, site of wound vac ?Pain Descriptors / Indicators: Discomfort, Grimacing, Operative site guarding, Tender ?Pain Intervention(s): Limited activity within patient's tolerance, Monitored during session, Repositioned  ? ? ? ?Hand Dominance Right ?  ?Extremity/Trunk Assessment Upper Extremity Assessment ?Upper Extremity Assessment: Overall WFL for tasks assessed (slight edema in digits, L forearm wrapped in gauze) ?  ?Lower Extremity Assessment ?Lower Extremity Assessment: Defer to PT evaluation ?  ?Cervical / Trunk Assessment ?Cervical / Trunk Assessment: Other exceptions ?Cervical / Trunk Exceptions: increased body habitus ?  ?Communication Communication ?Communication: No difficulties ?  ?Cognition Arousal/Alertness: Awake/alert ?Behavior During Therapy: [facility] for tasks assessed/performed ?Overall Cognitive Status: Within Functional Limits for tasks assessed ?  ?  ?  ?  ?  ?  ?  ?  ?  ?  ?  ?  ?  ?  ?  ?  ?  ?  ?  ?General  Comments    ? ?  ?Exercises   ?  ?Shoulder Instructions    ? ? ?Home Living Family/patient expects to be discharged to:: Private residence ?Living Arrangements: Spouse/significant other;Children (daughter is medically trained, wife is unable to assist much; 2 adult daughters live with him) ?Available Help at Discharge: Family;Available 24 hours/day ?Type of Home: House ?Home Access: Stairs to enter ?Entrance Stairs-Number of Steps: 3 ?Entrance Stairs-Rails: Can reach both ?Home Layout: One level ?  ?  ?Bathroom Shower/Tub: Gaffer;Tub/shower unit ?  ?Bathroom Toilet: Standard ?Bathroom Accessibility: Yes ?  ?Home Equipment: Rollator (4 wheels);Other (comment);Wheelchair - manual;Tub bench (hurrycane) ?  ?Additional Comments: Pt is caregiver for wife who has COPD; his son in law that lives with them in an amputee and has a tub bench and various DME ?  ? ?  ?Prior Functioning/Environment Prior Level of Function : Independent/Modified Independent;Driving ?  ?  ?  ?  ?  ?  ?Mobility Comments: Does not use AD. No falls. ?ADLs Comments: Was caregiver for his wife who has COPD, taking care of her O2 and doing the shopping, cleaning, cooking etc. Does not have to physically assist his wife for mobility or ADLs. ?  ? ?  ?  ?OT Problem List: Decreased activity tolerance;Impaired balance (sitting and/or standing) ?  ?   ?OT Treatment/Interventions: Self-care/ADL training;DME and/or AE instruction;Therapeutic activities;Patient/family education;Balance training  ?  ?  OT Goals(Current goals can be found in the care plan section) Acute Rehab OT Goals ?Patient Stated Goal: to get better and get back to guitar ?OT Goal Formulation: With patient ?Time For Goal Achievement: 12/13/21 ?Potential to Achieve Goals: Good ?ADL Goals ?Pt Will Perform Grooming: with supervision;standing ?Pt Will Perform Upper Body Dressing: with modified independence;sitting ?Pt Will Perform Lower Body Dressing: with supervision;with adaptive  equipment;sit to/from stand ?Pt Will Transfer to Toilet: with supervision;ambulating ?Pt Will Perform Toileting - Clothing Manipulation and hygiene: with supervision;sit to/from stand;sitting/lateral leans;with adaptive e

## 2021-11-29 NOTE — Progress Notes (Addendum)
?Progress Note ? ? ? ?11/29/2021 ?6:51 AM ?3 Days Post-Op ? ?Subjective:  says his arm is burning less.  He can tell a difference in his feet and says they feel better.   ? ?Afebrile ?HR 70's-80's NSR/Afib ?841'Y-606'T systolic ?01% O2 sats ? ?Gtts:  none ? ?Vitals:  ? 11/29/21 0500 11/29/21 0600  ?BP:    ?Pulse: 80 80  ?Resp: 10 (!) 9  ?Temp:    ?SpO2: 98% 97%  ? ? ?Physical Exam: ?Cardiac:  irregular ?Lungs:  non labored ?Incisions:  bilateral groins with Prevena vac in place with good seal ?Extremities:  brisk doppler signals bilateral feet; bilateral calves are soft and non tender ?Abdomen:  soft, NT to palpation ? ?CBC ?   ?Component Value Date/Time  ? WBC 11.6 (H) 11/29/2021 0455  ? RBC 2.86 (L) 11/29/2021 0455  ? HGB 8.6 (L) 11/29/2021 0455  ? HGB 13.3 10/12/2021 1314  ? HCT 26.5 (L) 11/29/2021 0455  ? HCT 39.4 10/12/2021 1314  ? PLT 84 (L) 11/29/2021 0455  ? PLT 175 10/12/2021 1314  ? MCV 92.7 11/29/2021 0455  ? MCV 97 10/12/2021 1314  ? MCH 30.1 11/29/2021 0455  ? MCHC 32.5 11/29/2021 0455  ? RDW 19.7 (H) 11/29/2021 0455  ? RDW 13.1 10/12/2021 1314  ? LYMPHSABS 0.9 11/28/2021 1151  ? LYMPHSABS 1.9 10/12/2021 1314  ? MONOABS 1.3 (H) 11/28/2021 1151  ? EOSABS 0.0 11/28/2021 1151  ? EOSABS 0.7 (H) 10/12/2021 1314  ? BASOSABS 0.0 11/28/2021 1151  ? BASOSABS 0.1 10/12/2021 1314  ? ? ?BMET ?   ?Component Value Date/Time  ? NA 141 11/29/2021 0455  ? NA 145 (H) 10/12/2021 1314  ? K 4.1 11/29/2021 0455  ? CL 110 11/29/2021 0455  ? CO2 26 11/29/2021 0455  ? GLUCOSE 108 (H) 11/29/2021 0455  ? BUN 46 (H) 11/29/2021 0455  ? BUN 37 (H) 10/12/2021 1314  ? CREATININE 1.79 (H) 11/29/2021 0455  ? CALCIUM 7.4 (L) 11/29/2021 0455  ? GFRNONAA 42 (L) 11/29/2021 0455  ? GFRAA 56 (L) 10/26/2019 0551  ? ? ?INR ?   ?Component Value Date/Time  ? INR 1.0 11/23/2021 1430  ? ? ? ?Intake/Output Summary (Last 24 hours) at 11/29/2021 0651 ?Last data filed at 11/29/2021 0600 ?Gross per 24 hour  ?Intake 1731.29 ml  ?Output 2380 ml  ?Net -648.71  ml  ? ? ? ?Assessment/Plan:  66 y.o. male is s/p:  ?#1: Right iliofemoral endarterectomy with vein patch angioplasty ?#2: Left iliofemoral endarterectomy with vein patch angioplasty ?#3: Stent, left common iliac artery ?#4: Stent, left external iliac artery ?#5: Intra-arterial lithotripsy: Left common and external iliac arteries ?#6: Intra-arterial lithotripsy: Right common and external iliac arteries ?#7: Stent, right external iliac artery ?#8: Stent, right common femoral artery ?#9: Bilateral groin Praveena incisional wound VAC  ?3 Days Post-Op ? ? ?-pt with brisk doppler signals bilateral feet and bilateral calves are soft and non tender.  Overall improving.  Less burning in left arm. ?-hemodynamically stable off pressors.  Afib last night.  Had afib yesterday and converted to NSR.  HR controlled.  Pt currently on sq heparin.  Would probably hold on heparin gtt given ABLA and thrombocytopenia.  Will d/w Dr. Trula Slade ?-AKI on CKD III continues to improve with creatinine down to 1.79 and good UOP with >2L. ?-Thrombocytopenia - stable at 84k ?-acute blood loss anemia-down slightly from yesterday - pt tolerating as he is off pressors. No indication for transfusion. ?-leukocytosis continues to improve and  he remains afebrile ?-DVT prophylaxis:  sq heparin ?-mobilize out of bed today.  May need one more day in ICU but will d/w Dr. Trula Slade ? ? ?Kevin Locket, PA-C ?[facility] ?[phone] ?11/29/2021 ?6:51 AM ? ?I agree with the above.  I have seen and examined the patient.  He looks much better today ?AFIB:  will start heparin today ?Renal:  creatinine almost back to normal with good UOP ?Acute blood loss anemia:  Hb stabilizing, no need for transfusion, will continue to minitor ?Will get out of bed and ambulate today ?Updated wife via phone today ?Best case scenario is d/c tomorrow, but likely Wednesday ? ?Kevin Gross ? ?

## 2021-11-29 NOTE — Progress Notes (Signed)
ANTICOAGULATION CONSULT NOTE - Initial Consult ? ?Pharmacy Consult for heparin ?Indication: atrial fibrillation ? ?Not on File ? ?Patient Measurements: ?Height: '5\' 8"'$  (172.7 cm) ?Weight: (!) 149.3 kg (329 lb 2.4 oz) ?IBW/kg (Calculated) : 68.4 ?Heparin Dosing Weight: 104kg ? ?Vital Signs: ?Temp: 98.6 ?F (37 ?C) (04/03 0745) ?Temp Source: Oral (04/03 0745) ?Pulse Rate: 80 (04/03 0600) ? ?Labs: ?Recent Labs  ?  11/26/21 ?1815 11/26/21 ?1955 11/26/21 ?2158 11/27/21 ?0328 11/27/21 ?0345 11/27/21 ?0809 11/28/21 ?8242 11/28/21 ?1151 11/29/21 ?0455  ?HGB 13.3   < > 14.2 13.2   < >  --  9.3* 9.6* 8.6*  ?HCT 39.1   < > 41.3 38.6*   < >  --  27.2* 28.9* 26.5*  ?PLT 142*  --  143* 136*  --   --  97* 86* 84*  ?CREATININE 2.11*  --   --  2.33*  --   --  1.84*  --  1.79*  ?CKTOTAL  --   --  173  --   --  206  --   --   --   ?TROPONINIHS 14  --  18*  --   --   --   --   --   --   ? < > = values in this interval not displayed.  ? ? ?Estimated Creatinine Clearance: 59.4 mL/min (A) (by C-G formula based on SCr of 1.79 mg/dL (H)). ? ? ?Medical History: ?Past Medical History:  ?Diagnosis Date  ? Alcoholic cirrhosis of liver ([place])   ? CHF (congestive heart failure) ([place])   ? Clotting disorder ([place])   ? Bilateral legs-pt states plaque build up in both legs requiring intervention  ? Complication of anesthesia   ? pt states that years ago during colonoscopy at [facility] they had to abort the procedure due to some "cardiac issue" had to see cardiologist  ? COPD (chronic obstructive pulmonary disease) ([place])   ? Hyperlipidemia   ? Hypertension   ? Pre-diabetes   ? Sleep apnea   ? uses cpap  ? ? ?Assessment: ?66 year old male with new afib. Anemia continues today, hgb down to 8.6 and plt low but stable at 84. No overt bleeding noted. New orders to transition patient from SQ heparin to IV heparin for his afib.  ? ?Goal of Therapy:  ?Heparin level 0.3-0.7 units/ml ?Monitor platelets by anticoagulation protocol: Yes ?  ?Plan:  ?No bolus due to recent  surgery and anemia ?Start heparin infusion at 1200 units/hr ?Check anti-Xa level in 8 hours and daily while on heparin ?Continue to monitor H&H and platelets ? ?Erin Hearing PharmD., BCPS ?Clinical Pharmacist ?11/29/2021 9:19 AM ? ?

## 2021-11-29 NOTE — Consult Note (Signed)
WOC Nurse Consult Note: ?Patient receiving care in [place].  Consult completed remotely after review of record, image of area, and phone discussion with primary RN, [place]. ?Reason for Consult: medication infiltration left arm ?Wound type: infiltration of vasopressor to LUE. See photo from 11/26/21. ?Pressure Injury POA: Yes/No/NA ?Measurement: ?Wound bed: red and dark purple ?Drainage (amount, consistency, odor) none ?Periwound: skin peeling ?Dressing procedure/placement/frequency: ?There was a previously entered order for daily application of Silvadene. I changed this to twice daily with vaseline and kerlix.  I also entered a nursing care instruction that if the discolored area becomes dark brown or black eschar, to reconsult the [place] team. ? ?PLEASE NOTE:  The patient needs to be followed up in the outpatient setting when discharged, to monitor for development of eschar to the arm. ? ?Monitor the wound area(s) for worsening of condition such as: ?Signs/symptoms of infection,  ?Increase in size,  ?Development of or worsening of odor, ?Development of pain, or increased pain at the affected locations.  Notify the medical team if any of these develop. ? ?Thank you for the consult.  Discussed plan of care with the bedside nurse.  [place] nurse will not follow at this time.  Please re-consult the [place] team if needed. ? ?Val Riles, RN, MSN, CWOCN, CNS-BC, pager [phone]  ?

## 2021-11-29 NOTE — Progress Notes (Signed)
ANTICOAGULATION CONSULT NOTE - Initial Consult ? ?Pharmacy Consult for heparin ?Indication: atrial fibrillation ? ?Not on File ? ?Patient Measurements: ?Height: '5\' 8"'$  (172.7 cm) ?Weight: (!) 149.3 kg (329 lb 2.4 oz) ?IBW/kg (Calculated) : 68.4 ?Heparin Dosing Weight: 104kg ? ?Vital Signs: ?Temp: 98.1 ?F (36.7 ?C) (04/03 2017) ?Temp Source: Oral (04/03 2017) ?BP: 144/70 (04/03 1900) ?Pulse Rate: 80 (04/03 2026) ? ?Labs: ?Recent Labs  ?  11/26/21 ?2158 11/27/21 ?0328 11/27/21 ?0345 11/27/21 ?0809 11/28/21 ?0600 11/28/21 ?1151 11/29/21 ?0455 11/29/21 ?2024  ?HGB 14.2 13.2   < >  --  9.3* 9.6* 8.6*  --   ?HCT 41.3 38.6*   < >  --  27.2* 28.9* 26.5*  --   ?PLT 143* 136*  --   --  97* 86* 84*  --   ?HEPARINUNFRC  --   --   --   --   --   --   --  0.36  ?CREATININE  --  2.33*  --   --  1.84*  --  1.79*  --   ?CKTOTAL 173  --   --  206  --   --   --   --   ?TROPONINIHS 18*  --   --   --   --   --   --   --   ? < > = values in this interval not displayed.  ? ? ? ?Estimated Creatinine Clearance: 59.4 mL/min (A) (by C-G formula based on SCr of 1.79 mg/dL (H)). ? ? ?Medical History: ?Past Medical History:  ?Diagnosis Date  ? Alcoholic cirrhosis of liver ([place])   ? CHF (congestive heart failure) ([place])   ? Clotting disorder ([place])   ? Bilateral legs-pt states plaque build up in both legs requiring intervention  ? Complication of anesthesia   ? pt states that years ago during colonoscopy at [facility] they had to abort the procedure due to some "cardiac issue" had to see cardiologist  ? COPD (chronic obstructive pulmonary disease) ([place])   ? Hyperlipidemia   ? Hypertension   ? Pre-diabetes   ? Sleep apnea   ? uses cpap  ? ? ?Assessment: ?66 year old male with new afib. Anemia continues today, hgb down to 8.6 and plt low but stable at 84. No overt bleeding noted. New orders to transition patient from SQ heparin to IV heparin for his afib.  ? ?Heparin level came back therapeutic tonight. We will continue with same rate and check level in  AM. ? ?Goal of Therapy:  ?Heparin level 0.3-0.7 units/ml ?Monitor platelets by anticoagulation protocol: Yes ?  ?Plan:  ?Continue heparin at 1200 units/hr ?Daily HL and CBC ? ? ?Onnie Boer, PharmD, BCIDP, AAHIVP, CPP ?Infectious Disease Pharmacist ?11/29/2021 9:04 PM ? ? ?

## 2021-11-30 ENCOUNTER — Other Ambulatory Visit (HOSPITAL_COMMUNITY): Payer: Self-pay

## 2021-11-30 LAB — TYPE AND SCREEN
ABO/RH(D): A POS
Antibody Screen: NEGATIVE
Unit division: 0
Unit division: 0
Unit division: 0
Unit division: 0
Unit division: 0
Unit division: 0
Unit division: 0
Unit division: 0
Unit division: 0
Unit division: 0
Unit division: 0

## 2021-11-30 LAB — BPAM RBC
Blood Product Expiration Date: 202304052359
Blood Product Expiration Date: 202304102359
Blood Product Expiration Date: 202304122359
Blood Product Expiration Date: 202304282359
Blood Product Expiration Date: 202304282359
Blood Product Expiration Date: 202304282359
Blood Product Expiration Date: 202304282359
Blood Product Expiration Date: 202304282359
Blood Product Expiration Date: 202305012359
Blood Product Expiration Date: 202305012359
Blood Product Expiration Date: 202305012359
ISSUE DATE / TIME: 202303311119
ISSUE DATE / TIME: 202303311119
ISSUE DATE / TIME: 202303311245
ISSUE DATE / TIME: 202303311245
ISSUE DATE / TIME: 202303311332
ISSUE DATE / TIME: 202303311332
ISSUE DATE / TIME: 202303311352
ISSUE DATE / TIME: 202303311352
ISSUE DATE / TIME: 202303311832
Unit Type and Rh: 6200
Unit Type and Rh: 6200
Unit Type and Rh: 6200
Unit Type and Rh: 6200
Unit Type and Rh: 6200
Unit Type and Rh: 6200
Unit Type and Rh: 6200
Unit Type and Rh: 6200
Unit Type and Rh: 6200
Unit Type and Rh: 6200
Unit Type and Rh: 6200

## 2021-11-30 LAB — CBC
HCT: 28.4 % — ABNORMAL LOW (ref 39.0–52.0)
Hemoglobin: 9 g/dL — ABNORMAL LOW (ref 13.0–17.0)
MCH: 30.1 pg (ref 26.0–34.0)
MCHC: 31.7 g/dL (ref 30.0–36.0)
MCV: 95 fL (ref 80.0–100.0)
Platelets: 103 10*3/uL — ABNORMAL LOW (ref 150–400)
RBC: 2.99 MIL/uL — ABNORMAL LOW (ref 4.22–5.81)
RDW: 19.2 % — ABNORMAL HIGH (ref 11.5–15.5)
WBC: 9.6 10*3/uL (ref 4.0–10.5)
nRBC: 0.2 % (ref 0.0–0.2)

## 2021-11-30 LAB — HEPARIN LEVEL (UNFRACTIONATED): Heparin Unfractionated: 0.53 IU/mL (ref 0.30–0.70)

## 2021-11-30 LAB — GLUCOSE, CAPILLARY
Glucose-Capillary: 74 mg/dL (ref 70–99)
Glucose-Capillary: 99 mg/dL (ref 70–99)
Glucose-Capillary: 104 mg/dL — ABNORMAL HIGH (ref 70–99)
Glucose-Capillary: 107 mg/dL — ABNORMAL HIGH (ref 70–99)
Glucose-Capillary: 91 mg/dL (ref 70–99)

## 2021-11-30 MED ORDER — POLYETHYLENE GLYCOL 3350 17 G PO PACK
17.0000 g | PACK | Freq: Every day | ORAL | Status: DC
Start: 2021-12-01 — End: 2021-12-06
  Administered 2021-12-03 – 2021-12-05 (×3): 17 g via ORAL
  Filled 2021-11-30 (×4): qty 1

## 2021-11-30 NOTE — Addendum Note (Signed)
Addendum  created 11/30/21 0801 by Annye Asa, MD  ? Clinical Note Signed  ?  ?

## 2021-11-30 NOTE — Discharge Instructions (Signed)
 [facility]  Discharge instructions  Lower Extremity Bypass Surgery  Please refer to the following instruction for your post-procedure care. Your surgeon or physician assistant will discuss any changes with you.  Activity  You are encouraged to walk as much as you can. You can slowly return to normal activities during the month after your surgery. Avoid strenuous activity and heavy lifting until your doctor tells you it's OK. Avoid activities such as vacuuming or swinging a golf club. Do not drive until your doctor give the OK and you are no longer taking prescription pain medications. It is also normal to have difficulty with sleep habits, eating and bowel movement after surgery. These will go away with time.  Bathing/Showering  Shower daily after you go home. Do not soak in a bathtub, hot tub, or swim until the incision heals completely.  Incision Care  Clean your incision with mild soap and water. Shower every day. Pat the area dry with a clean towel. You do not need a bandage unless otherwise instructed. Do not apply any ointments or creams to your incision. If you have open wounds you will be instructed how to care for them or a visiting nurse may be arranged for you. If you have staples or sutures along your incision they will be removed at your post-op appointment. You may have skin glue on your incision. Do not peel it off. It will come off on its own in about one week.  Wash the groin wound with soap and water daily and pat dry. (No tub bath-only shower)  Then put a dry gauze or washcloth in the groin to keep this area dry to help prevent wound infection.  Do this daily and as needed.  Do not use Vaseline or neosporin on your incisions.  Only use soap and water on your incisions and then protect and keep dry.  Diet  Resume your normal diet. There are no special food restrictions following this procedure. A low fat/ low cholesterol diet is  recommended for all patients with vascular disease. In order to heal from your surgery, it is CRITICAL to get adequate nutrition. Your body requires vitamins, minerals, and protein. Vegetables are the best source of vitamins and minerals. Vegetables also provide the perfect balance of protein. Processed food has little nutritional value, so try to avoid this.  Medications  Resume taking all your medications unless your doctor or physician assistant tells you not to. If your incision is causing pain, you may take over-the-counter pain relievers such as acetaminophen (Tylenol). If you were prescribed a stronger pain medication, please aware these medication can cause nausea and constipation. Prevent nausea by taking the medication with a snack or meal. Avoid constipation by drinking plenty of fluids and eating foods with high amount of fiber, such as fruits, vegetables, and grains. Take Colace 100 mg (an over-the-counter stool softener) twice a day as needed for constipation.  Do not take Tylenol if you are taking prescription pain medications.  Follow Up  Our office will schedule a follow up appointment 2-3 weeks following discharge.  Please call us immediately for any of the following conditions  Severe or worsening pain in your legs or feet while at rest or while walking Increase pain, redness, warmth, or drainage (pus) from your incision site(s) Fever of 101 degree or higher The swelling in your leg with the bypass suddenly worsens and becomes more painful than when you were in the hospital If you have   been instructed to feel your graft pulse then you should do so every day. If you can no longer feel this pulse, call the office immediately. Not all patients are given this instruction.  Leg swelling is common after leg bypass surgery.  The swelling should improve over a few months following surgery. To improve the swelling, you may elevate your legs above the level of your heart while you are  sitting or resting. Your surgeon or physician assistant may ask you to apply an ACE wrap or wear compression (TED) stockings to help to reduce swelling.  Reduce your risk of vascular disease  Stop smoking. If you would like help call [facility] at 1-800-QUIT-NOW (1-800-784-8669) or [place] at 336-586-4000.  Manage your cholesterol Maintain a desired weight Control your diabetes weight Control your diabetes Keep your blood pressure down  If you have any questions, please call the office at [phone]  

## 2021-11-30 NOTE — TOC Benefit Eligibility Note (Signed)
Patient Advocate Encounter ? ?Insurance verification completed.   ? ?The patient is currently admitted and upon discharge could be taking Eliquis 5 mg. ? ?The current 30 day co-pay is, $10.35.  ? ?The patient is insured through [employer] Part D  ? ? ? ?Lyndel Safe, CPhT ?Pharmacy Patient Advocate Specialist ?[facility] Patient Advocate Team ?Direct Number: [phone]  Fax: [phone] ? ? ? ? ? ?  ?

## 2021-11-30 NOTE — Anesthesia Postprocedure Evaluation (Signed)
Anesthesia Post Note ? ?Patient: Kevin Gross ? ?Procedure(s) Performed: BILATERAL FEMORAL ENDARTERECTOMY WITH VEIN PATCH ANGIOPLASTY (Bilateral: Groin) ?BILATERAL ILIAC STENTING USING 77mX80mm INNOVA STENT ON LEFT ILIAC AND 842m59mm, 53m76m9mm VBX AND 53mm74m5cm VIABAHN STENT ON RIGHT ILIAC (Bilateral: Groin) ?LEFT INFRARENAL LITHOTRIPSY (Left: Groin) ?VEIN HARVEST OF BILATERAL SPAHENOUS VEINS (Bilateral: Groin) ? ?  ? ?Patient location during evaluation: SICU ?Anesthesia Type: General ?Level of consciousness: awake and alert, oriented and patient cooperative ?Pain management: pain level controlled ?Vital Signs Assessment: post-procedure vital signs reviewed and stable ?Respiratory status: spontaneous breathing, nonlabored ventilation and respiratory function stable ?Cardiovascular status: blood pressure returned to baseline and stable ?Postop Assessment: no apparent nausea or vomiting, adequate PO intake and able to ambulate ?Anesthetic complications: yes ?Comments: Pt very stable off of pressors. He says he feels well, legs feel much better than they felt pre-op. He relates burning in L wrist infiltration site, but this is improving everyday. He relates that the dressings are helping. He is very appreciative of his care.  ? ? ?No notable events documented. ? ?Last Vitals:  ?Vitals:  ? 11/30/21 0600 11/30/21 0700  ?BP: (!) 155/74 (!) 161/80  ?Pulse: 84 91  ?Resp: 12 14  ?Temp:    ?SpO2: 98% 100%  ?  ?Last Pain:  ?Vitals:  ? 11/30/21 0745  ?TempSrc:   ?PainSc: 6   ? ? ?  ?  ?  ?  ?  ?  ? ?Inayah Woodin,E. Tip Atienza ? ? ? ? ?

## 2021-11-30 NOTE — Progress Notes (Signed)
?  Progress Note ? ? ? ?11/30/2021 ?6:46 AM ?4 Days Post-Op ? ?Subjective:  sitting up in chair.  Says his feet feel pretty good.  Walked in the halls yesterday.  Eating ok.  Voiding well.  ? ?Tm 98.9 ?HR 50's-80's NSR ?932'I-712'W systolic ?58% RA ? ?Vitals:  ? 11/30/21 0400 11/30/21 0500  ?BP: (!) 148/93 134/73  ?Pulse: 79 84  ?Resp: 16 11  ?Temp:    ?SpO2: 100% 96%  ? ? ?Physical Exam: ?Cardiac:  regular/irregular ?Lungs:  non labored ?Incisions:  bilateral groins with Prevena vac in place with good seal ?Extremities:  brisk doppler signals bilateral DP/PT ?Abdomen:  soft, NT ? ?CBC ?   ?Component Value Date/Time  ? WBC 9.6 11/30/2021 0404  ? RBC 2.99 (L) 11/30/2021 0404  ? HGB 9.0 (L) 11/30/2021 0404  ? HGB 13.3 10/12/2021 1314  ? HCT 28.4 (L) 11/30/2021 0404  ? HCT 39.4 10/12/2021 1314  ? PLT 103 (L) 11/30/2021 0404  ? PLT 175 10/12/2021 1314  ? MCV 95.0 11/30/2021 0404  ? MCV 97 10/12/2021 1314  ? MCH 30.1 11/30/2021 0404  ? MCHC 31.7 11/30/2021 0404  ? RDW 19.2 (H) 11/30/2021 0404  ? RDW 13.1 10/12/2021 1314  ? LYMPHSABS 0.9 11/28/2021 1151  ? LYMPHSABS 1.9 10/12/2021 1314  ? MONOABS 1.3 (H) 11/28/2021 1151  ? EOSABS 0.0 11/28/2021 1151  ? EOSABS 0.7 (H) 10/12/2021 1314  ? BASOSABS 0.0 11/28/2021 1151  ? BASOSABS 0.1 10/12/2021 1314  ? ? ?BMET ?   ?Component Value Date/Time  ? NA 141 11/29/2021 0455  ? NA 145 (H) 10/12/2021 1314  ? K 4.1 11/29/2021 0455  ? CL 110 11/29/2021 0455  ? CO2 26 11/29/2021 0455  ? GLUCOSE 108 (H) 11/29/2021 0455  ? BUN 46 (H) 11/29/2021 0455  ? BUN 37 (H) 10/12/2021 1314  ? CREATININE 1.79 (H) 11/29/2021 0455  ? CALCIUM 7.4 (L) 11/29/2021 0455  ? GFRNONAA 42 (L) 11/29/2021 0455  ? GFRAA 56 (L) 10/26/2019 0551  ? ? ?INR ?   ?Component Value Date/Time  ? INR 1.0 11/23/2021 1430  ? ? ? ?Intake/Output Summary (Last 24 hours) at 11/30/2021 0646 ?Last data filed at 11/30/2021 0500 ?Gross per 24 hour  ?Intake 437.38 ml  ?Output 2000 ml  ?Net -1562.62 ml  ? ? ? ?Assessment/Plan:  66 y.o. male is  s/p:  ?#1: Right iliofemoral endarterectomy with vein patch angioplasty ?#2: Left iliofemoral endarterectomy with vein patch angioplasty ?#3: Stent, left common iliac artery ?#4: Stent, left external iliac artery ?#5: Intra-arterial lithotripsy: Left common and external iliac arteries ?#6: Intra-arterial lithotripsy: Right common and external iliac arteries ?#7: Stent, right external iliac artery ?#8: Stent, right common femoral artery ?#9: Bilateral groin Praveena incisional wound VAC   ?4 Days Post-Op ? ? ?-pt with brisk doppler signals bilateral feet ?-leukocytosis resolved ?-ABLA-improving ?-pt was able to walk in the hallway yesterday ?-PT/OT recommending HH.  I have placed face to face and [facility] consult for this.  He will also need HHRN to monitor left arm. ?-foley removed yesterday and has been voiding.  ?-DVT prophylaxis:  heparin gtt for afib.  Pt now in NSR> ?-will transfer to [place] discharge in the next day or two. ? ? ?[name], PA-C ?[facility] ?[phone] ?11/30/2021 ?6:46 AM ? ? ? ?

## 2021-11-30 NOTE — Progress Notes (Signed)
ANTICOAGULATION CONSULT NOTE  ? ?Pharmacy Consult for heparin ?Indication: atrial fibrillation ? ?Not on File ? ?Patient Measurements: ?Height: '5\' 8"'$  (172.7 cm) ?Weight: (!) 146.7 kg (323 lb 6.6 oz) ?IBW/kg (Calculated) : 68.4 ?Heparin Dosing Weight: 104kg ? ?Vital Signs: ?Temp: 98.9 ?F (37.2 ?C) (04/04 0700) ?Temp Source: Axillary (04/04 0700) ?BP: 161/80 (04/04 0700) ?Pulse Rate: 91 (04/04 0700) ? ?Labs: ?Recent Labs  ?  11/28/21 ?1950 11/28/21 ?1151 11/29/21 ?0455 11/29/21 ?2024 11/30/21 ?0404  ?HGB 9.3* 9.6* 8.6*  --  9.0*  ?HCT 27.2* 28.9* 26.5*  --  28.4*  ?PLT 97* 86* 84*  --  103*  ?HEPARINUNFRC  --   --   --  0.36 0.53  ?CREATININE 1.84*  --  1.79*  --   --   ? ? ? ?Estimated Creatinine Clearance: 58.8 mL/min (A) (by C-G formula based on SCr of 1.79 mg/dL (H)). ? ? ?Medical History: ?Past Medical History:  ?Diagnosis Date  ? Alcoholic cirrhosis of liver ([place])   ? CHF (congestive heart failure) ([place])   ? Clotting disorder ([place])   ? Bilateral legs-pt states plaque build up in both legs requiring intervention  ? Complication of anesthesia   ? pt states that years ago during colonoscopy at [facility] they had to abort the procedure due to some "cardiac issue" had to see cardiologist  ? COPD (chronic obstructive pulmonary disease) ([place])   ? Hyperlipidemia   ? Hypertension   ? Pre-diabetes   ? Sleep apnea   ? uses cpap  ? ? ?Assessment: ?66 year old male with new afib. Anemia continues today, hgb down to 8.6 and plt low but stable at 84. No overt bleeding noted. New orders to transition patient from SQ heparin to IV heparin for his afib.  ? ?Heparin level came back therapeutic today. We will continue with same rate and check level in AM. ? ?Goal of Therapy:  ?Heparin level 0.3-0.7 units/ml ?Monitor platelets by anticoagulation protocol: Yes ?  ?Plan:  ?Continue heparin at 1200 units/hr ?Daily HL and CBC ? ?Erin Hearing PharmD., BCPS ?Clinical Pharmacist ?11/30/2021 8:33 AM ? ?

## 2021-11-30 NOTE — Progress Notes (Signed)
Pt arrived from [place], VSS, CHG complete, tele started, oriented to unit, call light within reach.  ? ?Alvis Lemmings, RN ?11/30/2021 ?6:38 PM  ?

## 2021-11-30 NOTE — Progress Notes (Signed)
Physical Therapy Treatment ?Patient Details ?Name: Kevin Gross ?MRN: [id number] ?DOB: [birth year] ?Today's Date: 11/30/2021 ? ? ?History of Present Illness Pt is a 66 y.o. male who presented 11/26/21 for bil femoral endarterectomy with patch angioplasty, bil iliac stenting, and bil infrarenal lithtripsy. Pt experienced extensive blood loss and was borught to the ICU intubated. ETT 3/31 - 4/2. PMH: alcoholic cirrhosis of liver, CHF, COPD, HLD, HTN, sleep apnea ? ?  ?PT Comments  ? ? Patient progressing well towards PT goals. Reports feeling well today with minimal pain. Session focused on progressive ambulation. Donned scrotal sling prior to mobility for comfort as pt continues to demonstrate wide BoS and flexed posture during gait due to swelling in scrotum. Able to progress to RA and VSS throughout mobility. Continues to demonstrate decreased activity tolerance and impaired cardiovascular endurance. Will continue to follow. ?   ?Recommendations for follow up therapy are one component of a multi-disciplinary discharge planning process, led by the attending physician.  Recommendations may be updated based on patient status, additional functional criteria and insurance authorization. ? ?Follow Up Recommendations ? Home health PT (vs no PT pending progress) ?  ?  ?Assistance Recommended at Discharge Intermittent Supervision/Assistance  ?Patient can return home with the following A little help with walking and/or transfers;A little help with bathing/dressing/bathroom;Assistance with cooking/housework;Assist for transportation;Help with stairs or ramp for entrance ?  ?Equipment Recommendations ? None recommended by PT  ?  ?Recommendations for Other Services   ? ? ?  ?Precautions / Restrictions Precautions ?Precautions: Fall;Other (comment) ?Precaution Comments: bil groin wound vac, scrotal edema ?Restrictions ?Weight Bearing Restrictions: No  ?  ? ?Mobility ? Bed Mobility ?  ?  ?  ?  ?  ?  ?  ?General bed mobility  comments: Up in chair upon PT arrival. ?  ? ?Transfers ?Overall transfer level: Needs assistance ?Equipment used: Rolling walker (2 wheels) ?Transfers: Sit to/from Stand ?Sit to Stand: Min guard ?  ?  ?  ?  ?  ?General transfer comment: Min guard for safety. Stood from Automotive engineer. ?  ? ?Ambulation/Gait ?Ambulation/Gait assistance: Min guard ?Gait Distance (Feet): 60 Feet (x2 bouts) ?Assistive device: Rolling walker (2 wheels) ?Gait Pattern/deviations: Step-through pattern, Decreased stride length, Wide base of support, Trunk flexed, Shuffle ?Gait velocity: reduced ?  ?  ?General Gait Details: Slow, mostly steady gait with wide BoS and flexed posture due to swollen scrotum. Sling donned prior to walking. Min guard assist and chair follow for support. 1 seated rest break. 2/4 DOE. VSS on RA. HR up to 112 bpm max ? ? ?Stairs ?  ?  ?  ?  ?  ? ? ?Wheelchair Mobility ?  ? ?Modified Rankin (Stroke Patients Only) ?  ? ? ?  ?Balance Overall balance assessment: Needs assistance ?Sitting-balance support: No upper extremity supported, Feet supported ?Sitting balance-Leahy Scale: Fair ?  ?  ?Standing balance support: During functional activity ?Standing balance-Leahy Scale: Fair ?Standing balance comment: Needs RW for walking distances ?  ?  ?  ?  ?  ?  ?  ?  ?  ?  ?  ?  ? ?  ?Cognition Arousal/Alertness: Awake/alert ?Behavior During Therapy: [facility] for tasks assessed/performed ?Overall Cognitive Status: Within Functional Limits for tasks assessed ?  ?  ?  ?  ?  ?  ?  ?  ?  ?  ?  ?  ?  ?  ?  ?  ?  ?  ?  ? ?  ?  Exercises   ? ?  ?General Comments General comments (skin integrity, edema, etc.): Scrotal sling left donned when back in chair for comfort. ?  ?  ? ?Pertinent Vitals/Pain Pain Assessment ?Pain Assessment: Faces ?Faces Pain Scale: Hurts little more ?Pain Location: groin, site of wound vac ?Pain Descriptors / Indicators: Discomfort, Grimacing, Tender, Operative site guarding ?Pain Intervention(s): Monitored during session,  Repositioned  ? ? ?Home Living   ?  ?  ?  ?  ?  ?  ?  ?  ?  ?   ?  ?Prior Function    ?  ?  ?   ? ?PT Goals (current goals can now be found in the care plan section) Progress towards PT goals: Progressing toward goals ? ?  ?Frequency ? ? ? Min 3X/week ? ? ? ?  ?PT Plan Current plan remains appropriate  ? ? ?Co-evaluation   ?  ?  ?  ?  ? ?  ?AM-PAC PT "6 Clicks" Mobility   ?Outcome Measure ? Help needed turning from your back to your side while in a flat bed without using bedrails?: A Little ?Help needed moving from lying on your back to sitting on the side of a flat bed without using bedrails?: A Little ?Help needed moving to and from a bed to a chair (including a wheelchair)?: A Little ?Help needed standing up from a chair using your arms (e.g., wheelchair or bedside chair)?: A Little ?Help needed to walk in hospital room?: A Little ?Help needed climbing 3-5 steps with a railing? : A Lot ?6 Click Score: 17 ? ?  ?End of Session   ?Activity Tolerance: Patient tolerated treatment well ?Patient left: in chair;with call bell/phone within reach ?Nurse Communication: Mobility status ?PT Visit Diagnosis: Unsteadiness on feet (R26.81);Other abnormalities of gait and mobility (R26.89);Muscle weakness (generalized) (M62.81);Difficulty in walking, not elsewhere classified (R26.2) ?  ? ? ?Time: 1000-1024 ?PT Time Calculation (min) (ACUTE ONLY): 24 min ? ?Charges:  $Gait Training: 23-37 mins          ?          ? ?Marisa Severin, PT, DPT ?Acute Rehabilitation Services ?Secure chat preferred ?Office [phone] ? ? ? ? ? ?[place] ?11/30/2021, 12:19 PM ? ?

## 2021-11-30 NOTE — Plan of Care (Signed)
°  Problem: Clinical Measurements: °Goal: Respiratory complications will improve °Outcome: Progressing °Goal: Cardiovascular complication will be avoided °Outcome: Progressing °  °Problem: Activity: °Goal: Risk for activity intolerance will decrease °Outcome: Progressing °  °Problem: Nutrition: °Goal: Adequate nutrition will be maintained °Outcome: Progressing °  °Problem: Pain Managment: °Goal: General experience of comfort will improve °Outcome: Progressing °  °

## 2021-11-30 NOTE — Progress Notes (Signed)
Pt placed on home cpap at this time. RT will cont to monitor. ?

## 2021-12-01 ENCOUNTER — Other Ambulatory Visit (HOSPITAL_COMMUNITY): Payer: Self-pay

## 2021-12-01 LAB — CBC
HCT: 30.7 % — ABNORMAL LOW (ref 39.0–52.0)
Hemoglobin: 9.8 g/dL — ABNORMAL LOW (ref 13.0–17.0)
MCH: 30.1 pg (ref 26.0–34.0)
MCHC: 31.9 g/dL (ref 30.0–36.0)
MCV: 94.2 fL (ref 80.0–100.0)
Platelets: 177 10*3/uL (ref 150–400)
RBC: 3.26 MIL/uL — ABNORMAL LOW (ref 4.22–5.81)
RDW: 19.3 % — ABNORMAL HIGH (ref 11.5–15.5)
WBC: 12.9 10*3/uL — ABNORMAL HIGH (ref 4.0–10.5)
nRBC: 0.3 % — ABNORMAL HIGH (ref 0.0–0.2)

## 2021-12-01 LAB — GLUCOSE, CAPILLARY
Glucose-Capillary: 122 mg/dL — ABNORMAL HIGH (ref 70–99)
Glucose-Capillary: 126 mg/dL — ABNORMAL HIGH (ref 70–99)
Glucose-Capillary: 134 mg/dL — ABNORMAL HIGH (ref 70–99)
Glucose-Capillary: 87 mg/dL (ref 70–99)
Glucose-Capillary: 97 mg/dL (ref 70–99)

## 2021-12-01 LAB — BASIC METABOLIC PANEL
Anion gap: 10 (ref 5–15)
BUN: 37 mg/dL — ABNORMAL HIGH (ref 8–23)
CO2: 23 mmol/L (ref 22–32)
Calcium: 8.1 mg/dL — ABNORMAL LOW (ref 8.9–10.3)
Chloride: 105 mmol/L (ref 98–111)
Creatinine, Ser: 1.83 mg/dL — ABNORMAL HIGH (ref 0.61–1.24)
GFR, Estimated: 41 mL/min — ABNORMAL LOW (ref >60)
Glucose, Bld: 122 mg/dL — ABNORMAL HIGH (ref 70–99)
Potassium: 3.5 mmol/L (ref 3.5–5.1)
Sodium: 138 mmol/L (ref 135–145)

## 2021-12-01 LAB — HEPARIN LEVEL (UNFRACTIONATED)
Heparin Unfractionated: <0.1 IU/mL — ABNORMAL LOW (ref 0.30–0.70)
Heparin Unfractionated: 0.27 IU/mL — ABNORMAL LOW (ref 0.30–0.70)
Heparin Unfractionated: 0.34 IU/mL (ref 0.30–0.70)

## 2021-12-01 MED ORDER — POTASSIUM CHLORIDE CRYS ER 20 MEQ PO TBCR
20.0000 meq | EXTENDED_RELEASE_TABLET | Freq: Once | ORAL | Status: AC
Start: 2021-12-01 — End: 2021-12-01
  Administered 2021-12-01: 20 meq via ORAL
  Filled 2021-12-01: qty 1

## 2021-12-01 MED ORDER — ENSURE MAX PROTEIN PO LIQD
11.0000 [oz_av] | Freq: Two times a day (BID) | ORAL | Status: DC
  Administered 2021-12-01 – 2021-12-02 (×2): 11 [oz_av] via ORAL
  Administered 2021-12-02: 237 mL via ORAL
  Administered 2021-12-03 – 2021-12-06 (×7): 11 [oz_av] via ORAL
  Filled 2021-12-01 (×11): qty 330

## 2021-12-01 MED ORDER — ADULT MULTIVITAMIN W/MINERALS CH
1.0000 | ORAL_TABLET | Freq: Every day | ORAL | Status: DC
  Administered 2021-12-01 – 2021-12-06 (×6): 1 via ORAL
  Filled 2021-12-01 (×6): qty 1

## 2021-12-01 NOTE — Progress Notes (Signed)
Nutrition Follow-up ? ?DOCUMENTATION CODES:  ? ?Not applicable ? ?INTERVENTION:  ? ?Ensure Max po BID, each supplement provides 150 kcal and 30 grams of protein.  ? ?MVI with minerals daily. ? ?NUTRITION DIAGNOSIS:  ? ?Increased nutrient needs related to wound healing as evidenced by estimated needs. ? ?Ongoing  ? ?GOAL:  ? ?Patient will meet greater than or equal to 90% of their needs ? ?Progressing  ? ?MONITOR:  ? ?PO intake, Supplement acceptance, Skin ? ?REASON FOR ASSESSMENT:  ? ?Ventilator, Consult ?Enteral/tube feeding initiation and management, Assessment of nutrition requirement/status ? ?ASSESSMENT:  ? ?66 year old male who presented on 3/31 for vascular procedure. PMH of alcoholic cirrhosis, CHF, COPD, HLD, HTN, PVD, CAD, CKD stage III. ? ?Spoke with patient at bedside. RN was changing dressing to left arm wound. Discussed with patient ways to increase protein intake at home. Patient reports good appetite and good intake of meals; documented at 75-100%. Will add high protein supplement BID to increase protein intake and MVI with minerals daily to support wound healing.  ? ?Labs reviewed.  ?CBG: [phone] ? ?Medications reviewed and include Novolog. ? ?NUTRITION - FOCUSED PHYSICAL EXAM: ? ?Flowsheet Row Most Recent Value  ?Orbital Region No depletion  ?Upper Arm Region No depletion  ?Thoracic and Lumbar Region No depletion  ?Buccal Region No depletion  ?Temple Region No depletion  ?Clavicle Bone Region No depletion  ?Clavicle and Acromion Bone Region No depletion  ?Scapular Bone Region No depletion  ?Dorsal Hand No depletion  ?Patellar Region No depletion  ?Anterior Thigh Region No depletion  ?Posterior Calf Region No depletion  ?Edema (RD Assessment) Moderate  ?Hair Reviewed  ?Eyes Reviewed  ?Mouth Reviewed  ?Skin Reviewed  ?Nails Reviewed  ? ?  ? ? ?Diet Order:   ?Diet Order   ? ?       ?  Diet Heart Room service appropriate? Yes; Fluid consistency: Thin  Diet effective now       ?  ? ?  ?  ? ?   ? ? ?EDUCATION NEEDS:  ? ?Education needs have been addressed ? ?Skin:  Skin Assessment: Skin Integrity Issues: ?Skin Integrity Issues:: Other (Comment) ?Other: L forearm wound ? ?Last BM:  4/5 ? ?Height:  ? ?Ht Readings from Last 1 Encounters:  ?11/29/21 '5\' 8"'$  (1.727 m)  ? ? ?Weight:  ? ?Wt Readings from Last 1 Encounters:  ?11/30/21 (!) 146.7 kg  ? ? ?Ideal Body Weight:  70 kg ? ?BMI:  Body mass index is 49.18 kg/m?. ? ?Estimated Nutritional Needs:  ? ?Kcal:  2200-2400 ? ?Protein:  140-160 grams ? ?Fluid:  >/= 2.0 L ? ? ? ?Lucas Mallow RD, LDN, CNSC ?Please refer to Amion for contact information.                                                       ? ?

## 2021-12-01 NOTE — Progress Notes (Signed)
Occupational Therapy Treatment ?Patient Details ?Name: Kevin Gross ?MRN: [id number] ?DOB: 1956/07/09 ?Today's Date: 12/01/2021 ? ? ?History of present illness Pt is a 66 y.o. male who presented 11/26/21 for bil femoral endarterectomy with patch angioplasty, bil iliac stenting, and bil infrarenal lithtripsy. Pt experienced extensive blood loss and was borught to the ICU intubated. ETT 3/31 - 4/2. PMH: alcoholic cirrhosis of liver, CHF, COPD, HLD, HTN, sleep apnea ?  ?OT comments ? Pt was seen for OT ADL retraining session today with focus on education in energy conservation techniques and a/e use. Handouts issued to pt and reviewed extensively at bedside with examples/uses provided and discussed. Pt reports having access to tub bench PRN (son) and LH reacher at home. Pt politely declined OOB activity stating that he had recently been repositioned and having had pain medication prior to OT arrival.   ? ?Recommendations for follow up therapy are one component of a multi-disciplinary discharge planning process, led by the attending physician.  Recommendations may be updated based on patient status, additional functional criteria and insurance authorization. ?   ?Follow Up Recommendations ? Home health OT  ?  ?Assistance Recommended at Discharge Intermittent Supervision/Assistance  ?Patient can return home with the following ? A little help with walking and/or transfers;A lot of help with bathing/dressing/bathroom;Assistance with cooking/housework;Assist for transportation;Help with stairs or ramp for entrance ?  ?Equipment Recommendations ? BSC/3in1 ([place])  ?  ?Recommendations for Other Services   ? ?  ?Precautions / Restrictions Precautions ?Precautions: Fall;Other (comment) ?Precaution Comments: bil groin wound vac, scrotal edema ?Restrictions ?Weight Bearing Restrictions: No ?RLE Weight Bearing: Weight bearing as tolerated ?LLE Weight Bearing: Weight bearing as tolerated  ? ? ?  ? ?Mobility Bed Mobility ? Pt declined  secondary to being repositioned recently in bed ? ?Transfers ? Pt politely declined OOB activity as per above.  ?  ?Balance  N/a - pt seen at bedside  ? ?ADL either performed or assessed with clinical judgement  ? ?ADL Overall ADL's : Needs assistance/impaired ?Eating/Feeding: Set up;Bed level ?  ?General ADL Comments: Pt reported recently being repositioned in bed and having used the bathroom per his report. He was therefore educated in energy conservation techniques and a/e for use with ADL's and during self care tasks. He was provided with handouts that were reviewed with him at length today. He reports that he has access to tub bench PRN and expressed appreciation for review of EC techniques to make ADL's, selfcare, house work, yard work, shopping easier. He has a [place] and was educated in using it for dressing as well as picking up items. Other techniques such as sitting for tasks PRN, leaving frequently used objects on counter, gathering items prior to a task were all reviewed. Also discussed balancing rest with activity and planning his day as he continues to heal. Handouts left w/ pt in room for furture reference. ?  ? ?Extremity/Trunk Assessment Upper Extremity Assessment ?Upper Extremity Assessment: Overall WFL for tasks assessed (edema noted in digits, L forearm wrapped in gauze) ?  ?Lower Extremity Assessment ?Lower Extremity Assessment: Defer to PT evaluation ?  ?Cervical / Trunk Assessment ?Cervical / Trunk Assessment: Other exceptions ?Cervical / Trunk Exceptions: increased body habitus ?  ? ?Vision Baseline Vision/History: 1 Wears glasses ?Patient Visual Report: No change from baseline ?  ?  ?   ?   ? ?Cognition Arousal/Alertness: Awake/alert ?Behavior During Therapy: [facility] for tasks assessed/performed ?Overall Cognitive Status: Within Functional Limits for tasks assessed ?  ?   ?   ?   ?   ?   ? ? ?  Pertinent Vitals/ Pain       Pain Assessment ?Pain Assessment: 0-10 ?Pain Score: 7  ?Pain Location:  groin, site of wound vac ?Pain Descriptors / Indicators: Discomfort, Grimacing, Tender ?Pain Intervention(s): Limited activity within patient's tolerance, Monitored during session, Other (comment) (Pt was recently repositioned and given meds per his report) ? ?Home Living  Pt lives with family, wife and adult children ?  ? ?  ?Prior Functioning/Environment   Mod I per pt report  ? ?Frequency ? Min 2X/week  ? ? ? ? ?  ?Progress Toward Goals ? ?OT Goals(current goals can now be found in the care plan section) ? Progress towards OT goals: Progressing toward goals ? ?Acute Rehab OT Goals ?Patient Stated Goal: Get better, feel better and go home ?OT Goal Formulation: With patient ?Time For Goal Achievement: 12/13/21 ?Potential to Achieve Goals: Good  ?Plan Discharge plan remains appropriate   ? ?AM-PAC OT "6 Clicks" Daily Activity     ?Outcome Measure ? ? Help from another person eating meals?: None ?Help from another person taking care of personal grooming?: A Little ?Help from another person toileting, which includes using toliet, bedpan, or urinal?: A Little ?Help from another person bathing (including washing, rinsing, drying)?: A Lot ?Help from another person to put on and taking off regular upper body clothing?: A Little ?Help from another person to put on and taking off regular lower body clothing?: A Lot ?6 Click Score: 17 ? ?  ?End of Session Equipment Utilized During Treatment: Other (comment) (scrotal sling) ? ?OT Visit Diagnosis: Unsteadiness on feet (R26.81);Other abnormalities of gait and mobility (R26.89);Muscle weakness (generalized) (M62.81);Pain ?Pain - Right/Left:  (Bilateral) ?Pain - part of body: Leg (Groin site) ?  ?Activity Tolerance Patient tolerated treatment well ?  ?Patient Left in bed;with call bell/phone within reach;with bed alarm set ?  ?Nurse Communication  N/a ?  ? ?   ? ?Time: 5631-4970 ?OT Time Calculation (min): 21 min ? ?Charges: OT General Charges ?$OT Visit: 1 Visit ?OT  Treatments ?$Therapeutic Activity: 8-22 mins ? ? ?Percell Miller Beth Dixon, OTR/L ?12/01/2021, 11:53 AM ? ? ?

## 2021-12-01 NOTE — Progress Notes (Signed)
ANTICOAGULATION CONSULT NOTE  ? ?Pharmacy Consult for heparin ?Indication: atrial fibrillation ? ?No Known Allergies ? ?Patient Measurements: ?Height: '5\' 8"'$  (172.7 cm) ?Weight: (!) 146.7 kg (323 lb 6.6 oz) ?IBW/kg (Calculated) : 68.4 ?Heparin Dosing Weight: 104kg ? ?Vital Signs: ?Temp: 98.5 ?F (36.9 ?C) (04/05 1547) ?Temp Source: Oral (04/05 1547) ?BP: 100/67 (04/05 1547) ?Pulse Rate: 90 (04/05 1547) ? ?Labs: ?Recent Labs  ?  11/29/21 ?0455 11/29/21 ?2024 11/30/21 ?0404 12/01/21 ?0136 12/01/21 ?1205 12/01/21 ?1934  ?HGB 8.6*  --  9.0* 9.8*  --   --   ?HCT 26.5*  --  28.4* 30.7*  --   --   ?PLT 84*  --  103* 177  --   --   ?HEPARINUNFRC  --    < > 0.53 <0.10* 0.27* 0.34  ?CREATININE 1.79*  --   --  1.83*  --   --   ? < > = values in this interval not displayed.  ? ? ? ?Estimated Creatinine Clearance: 57.5 mL/min (A) (by C-G formula based on SCr of 1.83 mg/dL (H)). ? ? ?Medical History: ?Past Medical History:  ?Diagnosis Date  ? Alcoholic cirrhosis of liver ([place])   ? CHF (congestive heart failure) ([place])   ? Clotting disorder ([place])   ? Bilateral legs-pt states plaque build up in both legs requiring intervention  ? Complication of anesthesia   ? pt states that years ago during colonoscopy at [facility] they had to abort the procedure due to some "cardiac issue" had to see cardiologist  ? COPD (chronic obstructive pulmonary disease) ([place])   ? Hyperlipidemia   ? Hypertension   ? Pre-diabetes   ? Sleep apnea   ? uses cpap  ? ? ?Assessment: ?66 year old male with new afib. Anemia continues today, hgb down to 8.6 and plt low but stable at 84. No overt bleeding noted. New orders to transition patient from SQ heparin to IV heparin for his afib.  ? ?Heparin level came back in range tonight at 0.34. We will increase it slightly and f/u in AM.  ? ?Goal of Therapy:  ?Heparin level 0.3-0.7 units/ml ?Monitor platelets by anticoagulation protocol: Yes ?  ?Plan:  ?Increase heparin to 1550 units/hr ?Daily heparin level and CBC ?Transition to  [place] soon? ? ?Onnie Boer, PharmD, BCIDP, AAHIVP, CPP ?Infectious Disease Pharmacist ?12/01/2021 8:13 PM ? ? ? ? ? ? ?

## 2021-12-01 NOTE — TOC Initial Note (Addendum)
Transition of Care (TOC) - Initial/Assessment Note  ?Marvetta Gibbons Therapist, sports, BSN ?Transitions of Care ?Unit 4E- RN Case Manager ?See Treatment Team for direct phone #  ? ? ?Patient Details  ?Name: Kevin Gross ?MRN: [id number] ?Date of Birth: 20-Nov-1955 ? ?Transition of Care (TOC) CM/SW Contact:    ?Dahlia Client, Romeo Rabon, RN ?Phone Number: ?12/01/2021, 4:25 PM ? ?Clinical Narrative:                 ?Pt from home s/p vascular procedures w/ stenting and bil prevena VAC placements.  ?HHRN order placed- also noted recs by therapies for HHPT/OT- will need HH order modified to add PT/OT as well ? ?CM in to speak with pt at bedside- choice offered for [facility] needs with list provided Per CMS guidelines from medicare.gov website with star ratings (copy placed in shadow chart)  per pt he has had [place] services in past and really liked the therapist that came out but can not remember name of agency- per view in patient [place] system pt had [facility] [facility] [place] for [facility] in 2020.  ?Discussed DME needs as well- pt reports he has RW and Cpap at home, does not feel he needs BSC at this time.  ? ?Address, phone #s and PCP all confirmed with pt in epic.  ? ?Pt reports either his son or DIL will provide transportation home.  ? ?Call made to [facility] with [facility]- for [facility] referral- awaiting review and confirmation.  ?Updated- 12/02/21- 0900- per Ramond Marrow with [facility] they will have a delay in start of care due to the Holiday Weekend- and will be unable to start [facility] services until after next Monday- Per Ramond Marrow want to give pt opportunity to have services start earlier if possible- They can accept if pt wants or if another agency can not start any sooner as well.  ?CM spoke with pt to address [facility] delay- pt states he would like to see if another agency can start sooner- reviewed list with him again noting agencies that are in-network with insurance- pt has selected [facility] to try.  ?Call made to [facility] with Alvis Lemmings to see if they can accept and when  start of care would be- per Tommi Rumps they could potentially start over the weekend with latest being Monday- pt agreeable [facility] as accepted referral.  ? ?Expected Discharge Plan: [place] ?Barriers to Discharge: Continued Medical Work up ? ? ?Patient Goals and CMS Choice ?Patient states their goals for this hospitalization and ongoing recovery are:: return home ?CMS Medicare.gov Compare Post Acute Care list provided to:: Patient ?Choice offered to / list presented to : Patient ? ?Expected Discharge Plan and Services ?Expected Discharge Plan: [place] ?  ?Discharge Planning Services: CM Consult ?Post Acute Care Choice: Home Health ?Living arrangements for the past 2 months: [place] ?                ?DME Arranged: N/A ?DME Agency: NA ?  ?  ?  ?HH Arranged: RN, PT, OT ?[place] Agency: [place] ([facility] Alvis Lemmings ?Date HH Agency Contacted: 12/01/21  12/02/21 ?Time [place]: [phone] ?Representative spoke with at [place]: Ramond Marrow-- Tommi Rumps ? ?Prior Living Arrangements/Services ?Living arrangements for the past 2 months: [place] ?Lives with:: Spouse ?Patient language and need for interpreter reviewed:: Yes ?Do you feel safe going back to the place where you live?: Yes      ?Need for Family Participation in  Patient Care: Yes (Comment) ?Care giver support system in place?: Yes (comment) ?Current home services: DME (RW, Cpap) ?Criminal Activity/Legal Involvement Pertinent to Current Situation/Hospitalization: No - Comment as needed ? ?Activities of Daily Living ?  ?  ? ?Permission Sought/Granted ?Permission sought to share information with : Customer service manager ?Permission granted to share information with : Yes, Verbal Permission Granted ?   ? Permission granted to share info w AGENCY: HH ?   ?   ? ?Emotional Assessment ?Appearance:: Appears stated age ?Attitude/Demeanor/Rapport: Engaged ?Affect (typically observed): Accepting,  Appropriate, Pleasant ?Orientation: : Oriented to Self, Oriented to Place, Oriented to  Time, Oriented to Situation ?Alcohol / Substance Use: Not Applicable ?Psych Involvement: No (comment) ? 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 Diagnosis Date Noted  ? 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 PAD (peripheral artery disease) ([place]) 11/26/2021  ? Cellulitis of left lower extremity 02/18/2019  ? Aortic atherosclerosis ([place]) 04/10/2018  ? Mixed hyperlipidemia 04/10/2018  ? Morbid obesity ([place]) 04/10/2018  ? CAD (coronary artery disease), native coronary artery 04/08/2018  ? COPD (chronic obstructive pulmonary disease) ([place]) 04/08/2018  ? CRI (chronic renal insufficiency), stage 3 (moderate) ([place]) 04/08/2018  ? ?PCP:  Rutherford Limerick, PA ?Pharmacy:   ?[facility] [place] [place] AT [facility] OF [address] ?[place] ?[place] [address] ?Phone: [phone] Fax: [phone] ? ?[place] ?[place] [address] ?Phone: [phone] Fax: [phone] ? ?[facility] ?[address] ?[place] [address] ?Phone: [phone] Fax: [phone] ? ? ? ? ?Social Determinants of Health (SDOH) Interventions ?  ? ?Readmission Risk Interventions ?   ? View : No data to display.  ?  ?  ?  ? ? ? ?

## 2021-12-01 NOTE — Progress Notes (Signed)
ANTICOAGULATION CONSULT NOTE  ? ?Pharmacy Consult for heparin ?Indication: atrial fibrillation ? ?No Known Allergies ? ?Patient Measurements: ?Height: '5\' 8"'$  (172.7 cm) ?Weight: (!) 146.7 kg (323 lb 6.6 oz) ?IBW/kg (Calculated) : 68.4 ?Heparin Dosing Weight: 104kg ? ?Vital Signs: ?Temp: 98.1 ?F (36.7 ?C) (04/05 1251) ?Temp Source: Oral (04/05 1251) ?BP: 98/53 (04/05 1251) ?Pulse Rate: 89 (04/05 1251) ? ?Labs: ?Recent Labs  ?  11/29/21 ?0455 11/29/21 ?2024 11/30/21 ?0404 12/01/21 ?0136 12/01/21 ?1205  ?HGB 8.6*  --  9.0* 9.8*  --   ?HCT 26.5*  --  28.4* 30.7*  --   ?PLT 84*  --  103* 177  --   ?HEPARINUNFRC  --    < > 0.53 <0.10* 0.27*  ?CREATININE 1.79*  --   --  1.83*  --   ? < > = values in this interval not displayed.  ? ? ? ?Estimated Creatinine Clearance: 57.5 mL/min (A) (by C-G formula based on SCr of 1.83 mg/dL (H)). ? ? ?Medical History: ?Past Medical History:  ?Diagnosis Date  ? Alcoholic cirrhosis of liver ([place])   ? CHF (congestive heart failure) ([place])   ? Clotting disorder ([place])   ? Bilateral legs-pt states plaque build up in both legs requiring intervention  ? Complication of anesthesia   ? pt states that years ago during colonoscopy at [facility] they had to abort the procedure due to some "cardiac issue" had to see cardiologist  ? COPD (chronic obstructive pulmonary disease) ([place])   ? Hyperlipidemia   ? Hypertension   ? Pre-diabetes   ? Sleep apnea   ? uses cpap  ? ? ?Assessment: ?66 year old male with new afib. Anemia continues today, hgb down to 8.6 and plt low but stable at 84. No overt bleeding noted. New orders to transition patient from SQ heparin to IV heparin for his afib.  ? ?Heparin level just slightly below goal at 0.27.  No known issues with IV infusion. ? ?Goal of Therapy:  ?Heparin level 0.3-0.7 units/ml ?Monitor platelets by anticoagulation protocol: Yes ?  ?Plan:  ?Increase heparin to 1500 units/hr ?Daily heparin level and CBC ?Transition to [place] soon? ? ?[place] Crane, Vena Austria, BCPS,  BCCP ?Clinical Pharmacist ? 12/01/2021 1:08 PM  ? ?[facility] pharmacy phone numbers are listed on [email or web address] ? ? ? ? ?

## 2021-12-01 NOTE — Plan of Care (Signed)
°  Problem: Clinical Measurements: °Goal: Respiratory complications will improve °Outcome: Progressing °  °Problem: Activity: °Goal: Risk for activity intolerance will decrease °Outcome: Progressing °  °Problem: Health Behavior/Discharge Planning: °Goal: Ability to manage health-related needs will improve °Outcome: Not Progressing °  °

## 2021-12-01 NOTE — Progress Notes (Signed)
Pt has home CPAP, no assistance needed,  ?

## 2021-12-01 NOTE — Progress Notes (Signed)
ANTICOAGULATION CONSULT NOTE  ? ?Pharmacy Consult for heparin ?Indication: atrial fibrillation ? ?No Known Allergies ? ?Patient Measurements: ?Height: '5\' 8"'$  (172.7 cm) ?Weight: (Abnormal) 146.7 kg (323 lb 6.6 oz) ?IBW/kg (Calculated) : 68.4 ?Heparin Dosing Weight: 104kg ? ?Vital Signs: ?Temp: 98 ?F (36.7 ?C) (04/04 2042) ?Temp Source: Oral (04/04 2042) ?BP: 125/73 (04/04 2042) ?Pulse Rate: 84 (04/04 2046) ? ?Labs: ?Recent Labs  ?  11/28/21 ?0177 11/28/21 ?1151 11/29/21 ?0455 11/29/21 ?2024 11/30/21 ?0404 12/01/21 ?0136  ?HGB 9.3*   < > 8.6*  --  9.0* 9.8*  ?HCT 27.2*   < > 26.5*  --  28.4* 30.7*  ?PLT 97*   < > 84*  --  103* 177  ?HEPARINUNFRC  --   --   --  0.36 0.53 <0.10*  ?CREATININE 1.84*  --  1.79*  --   --  1.83*  ? < > = values in this interval not displayed.  ? ? ? ?Estimated Creatinine Clearance: 57.5 mL/min (A) (by C-G formula based on SCr of 1.83 mg/dL (H)). ? ? ?Medical History: ?Past Medical History:  ?Diagnosis Date  ? Alcoholic cirrhosis of liver ([place])   ? CHF (congestive heart failure) ([place])   ? Clotting disorder ([place])   ? Bilateral legs-pt states plaque build up in both legs requiring intervention  ? Complication of anesthesia   ? pt states that years ago during colonoscopy at [facility] they had to abort the procedure due to some "cardiac issue" had to see cardiologist  ? COPD (chronic obstructive pulmonary disease) ([place])   ? Hyperlipidemia   ? Hypertension   ? Pre-diabetes   ? Sleep apnea   ? uses cpap  ? ? ?Assessment: ?66 year old male with new afib. Anemia continues today, hgb down to 8.6 and plt low but stable at 84. No overt bleeding noted. New orders to transition patient from SQ heparin to IV heparin for his afib.  ? ?Heparin level undetectable: <0.10 -Per RN patient had issues with IV on left arm infiltrating prior to relocating from ICU. Heparin is infusing in left arm and was held for ~15 minutes prior to lab draw. Patient previously therapeutic on this rate twice, will increase infusion rate  slightly and recheck level. CBC stable ? ?Goal of Therapy:  ?Heparin level 0.3-0.7 units/ml ?Monitor platelets by anticoagulation protocol: Yes ?  ?Plan:  ?Increase heparin to 1400 units/hr ?Daily Heparin level and CBC ? ?Georga Bora, PharmD ?Clinical Pharmacist ?12/01/2021 3:11 AM ?Please check AMION for all [place] numbers ? ? ?

## 2021-12-01 NOTE — Care Management Important Message (Signed)
Important Message ? ?Patient Details  ?Name: Kevin Gross ?MRN: [id number] ?Date of Birth: [birth year] ? ? ?Medicare Important Message Given:  Yes ? ? ? ? ?[name] ?12/01/2021, 8:19 AM ?

## 2021-12-01 NOTE — Progress Notes (Addendum)
?Progress Note ? ? ? ?12/01/2021 ?8:11 AM ?5 Days Post-Op ? ?Subjective: Patient is feeling well this morning and eager to return home.  He has minimal pain in in his groin incisions.  He continues to have burning in left forearm. ? ? ?Vitals:  ? 12/01/21 0353 12/01/21 0747  ?BP: 132/63 117/75  ?Pulse: 84 96  ?Resp: 19 18  ?Temp: 98.8 ?F (37.1 ?C) 99.3 ?F (37.4 ?C)  ?SpO2: 100% 94%  ? ?Physical Exam: ?Cardiac:  RRR ?Lungs:  non labored ?Incisions:  B groin incisional vacs with good seal ?Extremities:  brisk PT and DP signals by doppler bilaterally ?Neurologic: A&O ? ?CBC ?   ?Component Value Date/Time  ? WBC 12.9 (H) 12/01/2021 0136  ? RBC 3.26 (L) 12/01/2021 0136  ? HGB 9.8 (L) 12/01/2021 0136  ? HGB 13.3 10/12/2021 1314  ? HCT 30.7 (L) 12/01/2021 0136  ? HCT 39.4 10/12/2021 1314  ? PLT 177 12/01/2021 0136  ? PLT 175 10/12/2021 1314  ? MCV 94.2 12/01/2021 0136  ? MCV 97 10/12/2021 1314  ? MCH 30.1 12/01/2021 0136  ? MCHC 31.9 12/01/2021 0136  ? RDW 19.3 (H) 12/01/2021 0136  ? RDW 13.1 10/12/2021 1314  ? LYMPHSABS 0.9 11/28/2021 1151  ? LYMPHSABS 1.9 10/12/2021 1314  ? MONOABS 1.3 (H) 11/28/2021 1151  ? EOSABS 0.0 11/28/2021 1151  ? EOSABS 0.7 (H) 10/12/2021 1314  ? BASOSABS 0.0 11/28/2021 1151  ? BASOSABS 0.1 10/12/2021 1314  ? ? ?BMET ?   ?Component Value Date/Time  ? NA 138 12/01/2021 0136  ? NA 145 (H) 10/12/2021 1314  ? K 3.5 12/01/2021 0136  ? CL 105 12/01/2021 0136  ? CO2 23 12/01/2021 0136  ? GLUCOSE 122 (H) 12/01/2021 0136  ? BUN 37 (H) 12/01/2021 0136  ? BUN 37 (H) 10/12/2021 1314  ? CREATININE 1.83 (H) 12/01/2021 0136  ? CALCIUM 8.1 (L) 12/01/2021 0136  ? GFRNONAA 41 (L) 12/01/2021 0136  ? GFRAA 56 (L) 10/26/2019 0551  ? ? ?INR ?   ?Component Value Date/Time  ? INR 1.0 11/23/2021 1430  ? ? ? ?Intake/Output Summary (Last 24 hours) at 12/01/2021 0811 ?Last data filed at 12/01/2021 0748 ?Gross per 24 hour  ?Intake 230.11 ml  ?Output 1200 ml  ?Net -969.89 ml  ? ? ? ?Assessment/Plan:  66 y.o. male is s/p   ?#1:  Right iliofemoral endarterectomy with vein patch angioplasty ?#2: Left iliofemoral endarterectomy with vein patch angioplasty ?#3: Stent, left common iliac artery ?#4: Stent, left external iliac artery ?#5: Intra-arterial lithotripsy: Left common and external iliac arteries ?#6: Intra-arterial lithotripsy: Right common and external iliac arteries ?#7: Stent, right external iliac artery ?#8: Stent, right common femoral artery ?#9: Bilateral groin Praveena incisional wound VAC  ?5 Days Post-Op  ? ? ?Bilateral lower extremities are well perfused with brisk Doppler signals of the DP and PT ?TFC we will arrange home health RN to help with dressing changes of left forearm at the patient's request ?Out of bed with therapy this morning ?Possible discharge home this afternoon versus tomorrow morning; incisional vacs can be removed this weekend at home.  Office will arrange follow-up in about 2 weeks.  He will be discharged on DAPT. ? ? ?Dagoberto Ligas, PA-C ?[facility] ?[phone] ?12/01/2021 ?8:11 AM ? ? ?I agree with the above.  I have seen and evaluated the patient.  He is recovering nicely.  He has brisk pedal Doppler signals.  We will continue to monitor his left forearm.  He has been in A-fib during the hospitalization and now on heparin.  If the A-fib resolved, we can stop the heparin, if not he will need to be transition to oral anticoagulation.  If he remains in A-fib, I will reach out to cardiology to determine whether or not he needs to be placed on amiodarone.  I anticipate discharge in the near future once home health issues have been resolved. ? ?Annamarie Major ?

## 2021-12-02 LAB — HEPARIN LEVEL (UNFRACTIONATED): Heparin Unfractionated: 0.33 IU/mL (ref 0.30–0.70)

## 2021-12-02 LAB — CBC
HCT: 21.9 % — ABNORMAL LOW (ref 39.0–52.0)
Hemoglobin: 7.2 g/dL — ABNORMAL LOW (ref 13.0–17.0)
MCH: 30.8 pg (ref 26.0–34.0)
MCHC: 32.9 g/dL (ref 30.0–36.0)
MCV: 93.6 fL (ref 80.0–100.0)
Platelets: 158 10*3/uL (ref 150–400)
RBC: 2.34 MIL/uL — ABNORMAL LOW (ref 4.22–5.81)
RDW: 19.2 % — ABNORMAL HIGH (ref 11.5–15.5)
WBC: 9.7 10*3/uL (ref 4.0–10.5)
nRBC: 0.2 % (ref 0.0–0.2)

## 2021-12-02 LAB — GLUCOSE, CAPILLARY
Glucose-Capillary: 106 mg/dL — ABNORMAL HIGH (ref 70–99)
Glucose-Capillary: 129 mg/dL — ABNORMAL HIGH (ref 70–99)
Glucose-Capillary: 107 mg/dL — ABNORMAL HIGH (ref 70–99)

## 2021-12-02 NOTE — Progress Notes (Addendum)
?  Progress Note ? ? ? ?12/02/2021 ?7:37 AM ?6 Days Post-Op ? ?Subjective:  Patient felt therapy went well yesterday ? ? ?Vitals:  ? 12/01/21 2300 12/02/21 0338  ?BP: 128/65 (!) 101/45  ?Pulse: 85 93  ?Resp: 16 18  ?Temp: 99.3 ?F (37.4 ?C) 98.1 ?F (36.7 ?C)  ?SpO2: 100% 97%  ? ?Physical Exam: ?Lungs:  non labored ?Incisions:  groins with prevena vac in place ?Extremities:  PT and AT by doppler ?Abdomen:  soft, NT ?Neurologic: A&O ? ?CBC ?   ?Component Value Date/Time  ? WBC 12.9 (H) 12/01/2021 0136  ? RBC 3.26 (L) 12/01/2021 0136  ? HGB 9.8 (L) 12/01/2021 0136  ? HGB 13.3 10/12/2021 1314  ? HCT 30.7 (L) 12/01/2021 0136  ? HCT 39.4 10/12/2021 1314  ? PLT 177 12/01/2021 0136  ? PLT 175 10/12/2021 1314  ? MCV 94.2 12/01/2021 0136  ? MCV 97 10/12/2021 1314  ? MCH 30.1 12/01/2021 0136  ? MCHC 31.9 12/01/2021 0136  ? RDW 19.3 (H) 12/01/2021 0136  ? RDW 13.1 10/12/2021 1314  ? LYMPHSABS 0.9 11/28/2021 1151  ? LYMPHSABS 1.9 10/12/2021 1314  ? MONOABS 1.3 (H) 11/28/2021 1151  ? EOSABS 0.0 11/28/2021 1151  ? EOSABS 0.7 (H) 10/12/2021 1314  ? BASOSABS 0.0 11/28/2021 1151  ? BASOSABS 0.1 10/12/2021 1314  ? ? ?BMET ?   ?Component Value Date/Time  ? NA 138 12/01/2021 0136  ? NA 145 (H) 10/12/2021 1314  ? K 3.5 12/01/2021 0136  ? CL 105 12/01/2021 0136  ? CO2 23 12/01/2021 0136  ? GLUCOSE 122 (H) 12/01/2021 0136  ? BUN 37 (H) 12/01/2021 0136  ? BUN 37 (H) 10/12/2021 1314  ? CREATININE 1.83 (H) 12/01/2021 0136  ? CALCIUM 8.1 (L) 12/01/2021 0136  ? GFRNONAA 41 (L) 12/01/2021 0136  ? GFRAA 56 (L) 10/26/2019 0551  ? ? ?INR ?   ?Component Value Date/Time  ? INR 1.0 11/23/2021 1430  ? ? ? ?Intake/Output Summary (Last 24 hours) at 12/02/2021 0737 ?Last data filed at 12/02/2021 0650 ?Gross per 24 hour  ?Intake 563.4 ml  ?Output 2650 ml  ?Net -2086.6 ml  ? ? ? ?Assessment/Plan:  66 y.o. male is s/p   ?#1: Right iliofemoral endarterectomy with vein patch angioplasty ?#2: Left iliofemoral endarterectomy with vein patch angioplasty ?#3: Stent, left  common iliac artery ?#4: Stent, left external iliac artery ?#5: Intra-arterial lithotripsy: Left common and external iliac arteries ?#6: Intra-arterial lithotripsy: Right common and external iliac arteries ?#7: Stent, right external iliac artery ?#8: Stent, right common femoral artery ?#9: Bilateral groin Praveena incisional wound VAC  ?6 Days Post-Op  ? ?BLE well perfused by doppler exam ?Incisional vac on both groins with good seal; can remove these before discharge home tomorrow ?TOC arranged HH PT/RN ?Plan is to discharge home on DAPT as long as he stays in sinus rhythm ?Therapy to treat again today ?Home tomorrow ? ? ? ?Dagoberto Ligas, PA-C ?[facility] ?[phone] ?12/02/2021 ?7:37 AM ? ?I agree with the above.  Anticipate d/c home tomorrow.  If remains out of AFIB, can go home without anticoagulation.  If he goes back into AFIB, would ask cardiology to formally evaluate.  D/c Provena prior to discharge.  Home with Plavix, ASA and statin. ? ?Annamarie Major ? ?

## 2021-12-02 NOTE — Progress Notes (Signed)
ANTICOAGULATION CONSULT NOTE  ? ?Pharmacy Consult for heparin ?Indication: atrial fibrillation ? ?No Known Allergies ? ?Patient Measurements: ?Height: '5\' 8"'$  (172.7 cm) ?Weight: (!) 146.7 kg (323 lb 6.6 oz) ?IBW/kg (Calculated) : 68.4 ?Heparin Dosing Weight: 104kg ? ?Vital Signs: ?Temp: 98.4 ?F (36.9 ?C) (04/06 1204) ?Temp Source: Oral (04/06 1204) ?BP: 100/68 (04/06 1204) ?Pulse Rate: 79 (04/06 0805) ? ?Labs: ?Recent Labs  ?  11/30/21 ?0404 12/01/21 ?0136 12/01/21 ?1205 12/01/21 ?1934 12/02/21 ?1134  ?HGB 9.0* 9.8*  --   --  7.2*  ?HCT 28.4* 30.7*  --   --  21.9*  ?PLT 103* 177  --   --  158  ?HEPARINUNFRC 0.53 <0.10* 0.27* 0.34 0.33  ?CREATININE  --  1.83*  --   --   --   ? ? ? ?Estimated Creatinine Clearance: 57.5 mL/min (A) (by C-G formula based on SCr of 1.83 mg/dL (H)). ? ? ?Medical History: ?Past Medical History:  ?Diagnosis Date  ? Alcoholic cirrhosis of liver ([place])   ? CHF (congestive heart failure) ([place])   ? Clotting disorder ([place])   ? Bilateral legs-pt states plaque build up in both legs requiring intervention  ? Complication of anesthesia   ? pt states that years ago during colonoscopy at [facility] they had to abort the procedure due to some "cardiac issue" had to see cardiologist  ? COPD (chronic obstructive pulmonary disease) ([place])   ? Hyperlipidemia   ? Hypertension   ? Pre-diabetes   ? Sleep apnea   ? uses cpap  ? ? ?Assessment: ?66 year old male with new afib. Anemia continues today, hgb down to 8.6 and plt low but stable at 84. No overt bleeding noted. New orders to transition patient from SQ heparin to IV heparin for his afib.  ? ?Heparin level at goal this AM.  No overt bleeding or complications noted. ? ?Goal of Therapy:  ?Heparin level 0.3-0.7 units/ml ?Monitor platelets by anticoagulation protocol: Yes ?  ?Plan:  ?Continue heparin 1550 units/hr ?Daily heparin level and CBC ?Transition to [place] soon if back in afib, otherwise planning home just on DAPT. ? ?[place] Crane, Pharm D, BCPS, BCCP ?Clinical  Pharmacist ? 12/02/2021 2:07 PM  ? ?[facility] pharmacy phone numbers are listed on [email or web address] ? ? ? ? ? ? ? ?

## 2021-12-02 NOTE — Progress Notes (Signed)
Physical Therapy Treatment ?Patient Details ?Name: Kevin Gross ?MRN: [id number] ?DOB: Apr 15, 1956 ?Today's Date: 12/02/2021 ? ? ?History of Present Illness Pt is a 66 y.o. male who presented 11/26/21 for bil femoral endarterectomy with patch angioplasty, bil iliac stenting, and bil infrarenal lithtripsy. Pt experienced extensive blood loss and was borught to the ICU intubated. ETT 3/31 - 4/2. PMH: alcoholic cirrhosis of liver, CHF, COPD, HLD, HTN, sleep apnea. ? ?  ?PT Comments  ? ? Pt received in supine, PTA re-entered room to give further instruction on repositioning for management of scrotal edema and technique to don sling for resting edema mgmt and with mobility as pt had been limited previous session by need for BSC. Pt receptive to instruction and given handout to reinforce edema reduction techniques and cryotherapy for pain mgmt. RN notified pt requesting pain meds. Pt continues to benefit from PT services to progress toward functional mobility goals.    ?Recommendations for follow up therapy are one component of a multi-disciplinary discharge planning process, led by the attending physician.  Recommendations may be updated based on patient status, additional functional criteria and insurance authorization. ? ?Follow Up Recommendations ? Home health PT (pending progress next session) ?  ?  ?Assistance Recommended at Discharge Intermittent Supervision/Assistance  ?Patient can return home with the following A little help with walking and/or transfers;A little help with bathing/dressing/bathroom;Assistance with cooking/housework;Assist for transportation;Help with stairs or ramp for entrance ?  ?Equipment Recommendations ? None recommended by PT  ?  ?Recommendations for Other Services   ? ? ?  ?Precautions / Restrictions Precautions ?Precautions: Fall;Other (comment) ?Precaution Comments: bil groin wound vac, scrotal edema ?Restrictions ?Weight Bearing Restrictions: No  ?  ? ?Mobility ? Bed Mobility ?Overal bed  mobility: Needs Assistance ?Bed Mobility: Supine to Sit ?  ?  ?Supine to sit: Min assist, HOB elevated ?  ?  ?General bed mobility comments: pt defers due to pain after recent return to supine, pt agreeable to instruction on scrotal edema mgmt and repositioning of this area during session ?  ? ?Transfers ?Overall transfer level: Needs assistance ?Equipment used: None ?Transfers: Bed to chair/wheelchair/BSC, Sit to/from Stand ?Sit to Stand: Min guard ?Stand pivot transfers: Min assist ?  ?  ?  ?  ?General transfer comment: pt defer, recently return to supine ?  ? ?Ambulation/Gait ?  ?  ?  ?  ?  ?  ?Pre-gait activities: defer 2/2 pt bowel urgency; pt requesting to use BSC >15 mins ?  ? ? ?Stairs ?  ?  ?  ?  ?  ? ? ?Wheelchair Mobility ?  ? ?Modified Rankin (Stroke Patients Only) ?  ? ? ?  ?Balance Overall balance assessment: Needs assistance ?Sitting-balance support: No upper extremity supported, Feet supported ?Sitting balance-Leahy Scale: Fair ?Sitting balance - Comments: posterior lean sitting upright 2/2 groin pain/edema ?Postural control: Posterior lean ?Standing balance support: During functional activity ?Standing balance-Leahy Scale: Fair ?Standing balance comment: 1-2 UE support ?  ?  ?  ?  ?  ?  ?  ?  ?  ?  ?  ?  ? ?  ?Cognition Arousal/Alertness: Awake/alert ?Behavior During Therapy: [facility] for tasks assessed/performed ?Overall Cognitive Status: Within Functional Limits for tasks assessed ?  ?  ?  ?  ?  ?  ?  ?  ?  ?  ?  ?  ?  ?  ?  ?  ?General Comments: pleasantly cooperative, good following of 1 and 2-step instructions ?  ?  ? ?  ?  Exercises Other Exercises ?Other Exercises: supine BLE AROM: ankle pumps, quad sets, hip abduction x5 reps ea, reinforced to perform TID for ROM/strengthening ? ?  ?General Comments General comments (skin integrity, edema, etc.): noted scrotal edema; pt instructed on elevation technique with pillowcase and folded washcloths underneath to maintain elevation at rest and so pt can  more easily reposition himself in bed; pt given handout to reinforce use of cryotherapy and positioning for edema mgmt ?  ?  ? ?Pertinent Vitals/Pain Pain Assessment ?Pain Assessment: Faces ?Pain Score: 5  ?Faces Pain Scale: Hurts even more ?Pain Location: L forearm (prev IV infiltration site, gauze wrapped), scrotum (edema), incisional sites ?Pain Descriptors / Indicators: Discomfort, Grimacing, Tender, Operative site guarding ?Pain Intervention(s): Monitored during session, Repositioned, Limited activity within patient's tolerance, Patient requesting pain meds-RN notified, Ice applied  ? ? ?Home Living   ?  ?  ?  ?  ?  ?  ?  ?  ?  ?   ?  ?Prior Function    ?  ?  ?   ? ?PT Goals (current goals can now be found in the care plan section) Acute Rehab PT Goals ?Patient Stated Goal: to improve ?PT Goal Formulation: With patient ?Time For Goal Achievement: 12/12/21 ?Progress towards PT goals: Progressing toward goals ? ?  ?Frequency ? ? ? Min 3X/week ? ? ? ?  ?PT Plan Current plan remains appropriate  ? ? ?Co-evaluation   ?  ?  ?  ?  ? ?  ?AM-PAC PT "6 Clicks" Mobility   ?Outcome Measure ? Help needed turning from your back to your side while in a flat bed without using bedrails?: A Little ?Help needed moving from lying on your back to sitting on the side of a flat bed without using bedrails?: A Little ?Help needed moving to and from a bed to a chair (including a wheelchair)?: A Little ?Help needed standing up from a chair using your arms (e.g., wheelchair or bedside chair)?: A Little ?Help needed to walk in hospital room?: A Little ?Help needed climbing 3-5 steps with a railing? : Total ?6 Click Score: 16 ? ?  ?End of Session Equipment Utilized During Treatment: Gait belt ?Activity Tolerance: Patient limited by pain ?Patient left: with call bell/phone within reach;in bed ?Nurse Communication: Mobility status;Patient requests pain meds ?PT Visit Diagnosis: Unsteadiness on feet (R26.81);Other abnormalities of gait and  mobility (R26.89);Muscle weakness (generalized) (M62.81);Difficulty in walking, not elsewhere classified (R26.2) ?  ? ? ?Time: 8003-4917 ?PT Time Calculation (min) (ACUTE ONLY): 16 min ? ?Charges:  $Therapeutic Activity: 8-22 mins          ?          ? ?Almeda Ezra P., PTA ?[facility] ?Secure Chat Preferred 9a-5:30pm ?Office: [phone]  ? ? ?Kara Pacer Modupe Shampine ?12/02/2021, 6:15 PM ? ?

## 2021-12-02 NOTE — Progress Notes (Signed)
Physical Therapy Treatment ?Patient Details ?Name: Kevin Gross ?MRN: [id number] ?DOB: 1955-12-03 ?Today's Date: 12/02/2021 ? ? ?History of Present Illness Pt is a 66 y.o. male who presented 11/26/21 for bil femoral endarterectomy with patch angioplasty, bil iliac stenting, and bil infrarenal lithtripsy. Pt experienced extensive blood loss and was borught to the ICU intubated. ETT 3/31 - 4/2. PMH: alcoholic cirrhosis of liver, CHF, COPD, HLD, HTN, sleep apnea. ? ?  ?PT Comments  ? ? Pt received in supine, c/o scrotal and L forearm discomfort but agreeable to therapy session with encouragement. Pt needing up to minA for bed mobility and stand pivot transfer from EOB>BSC. Session time limited due to pt with bowel urgency, needing increased time to use BSC, NT notified, pt instructed on use of call bell for assist back to bed and not to get up on his own, pt agreeable. Emphasis on edema management and use of pillowcase to make sling for repositioning scrotum in supine. Pt continues to benefit from PT services to progress toward functional mobility goals.    ?Recommendations for follow up therapy are one component of a multi-disciplinary discharge planning process, led by the attending physician.  Recommendations may be updated based on patient status, additional functional criteria and insurance authorization. ? ?Follow Up Recommendations ? Home health PT ?  ?  ?Assistance Recommended at Discharge Intermittent Supervision/Assistance  ?Patient can return home with the following A little help with walking and/or transfers;A little help with bathing/dressing/bathroom;Assistance with cooking/housework;Assist for transportation;Help with stairs or ramp for entrance ?  ?Equipment Recommendations ? None recommended by PT  ?  ?Recommendations for Other Services   ? ? ?  ?Precautions / Restrictions Precautions ?Precautions: Fall;Other (comment) ?Precaution Comments: bil groin wound vac, scrotal edema ?Restrictions ?Weight Bearing  Restrictions: No  ?  ? ?Mobility ? Bed Mobility ?Overal bed mobility: Needs Assistance ?Bed Mobility: Supine to Sit ?  ?  ?Supine to sit: Min assist, HOB elevated ?  ?  ?General bed mobility comments: cues for body mechanics, heavy reliance on bed rails/features, minA trunk support ?  ? ?Transfers ?Overall transfer level: Needs assistance ?Equipment used: None ?Transfers: Bed to chair/wheelchair/BSC, Sit to/from Stand ?Sit to Stand: Min guard ?Stand pivot transfers: Min assist ?  ?  ?  ?  ?General transfer comment: from EOB>bari drop arm BSC ?  ? ?Ambulation/Gait ?  ?  ?  ?  ?  ?  ?Pre-gait activities: defer 2/2 pt bowel urgency; pt requesting to use BSC >15 mins ?  ? ? ?Stairs ?  ?  ?  ?  ?  ? ? ?Wheelchair Mobility ?  ? ?Modified Rankin (Stroke Patients Only) ?  ? ? ?  ?Balance Overall balance assessment: Needs assistance ?Sitting-balance support: No upper extremity supported, Feet supported ?Sitting balance-Leahy Scale: Fair ?Sitting balance - Comments: posterior lean sitting upright 2/2 groin pain/edema ?Postural control: Posterior lean ?Standing balance support: During functional activity ?Standing balance-Leahy Scale: Fair ?Standing balance comment: 1-2 UE support ?  ?  ?  ?  ?  ?  ?  ?  ?  ?  ?  ?  ? ?  ?Cognition Arousal/Alertness: Awake/alert ?Behavior During Therapy: [facility] for tasks assessed/performed ?Overall Cognitive Status: Within Functional Limits for tasks assessed ?  ?  ?  ?  ?  ?  ?  ?  ?  ?  ?  ?  ?  ?  ?  ?  ?General Comments: pleasantly cooperative, good following of 1 and 2-step  instructions ?  ?  ? ?  ?Exercises   ? ?  ?General Comments General comments (skin integrity, edema, etc.): noted scrotal edema; pt instructed on elevation technique with pillowcase, he will need staff assist to perform due to body habitus; HR elevated to 124 bpm with exertion (pivoting), no dizziness once seated ?  ?  ? ?Pertinent Vitals/Pain Pain Assessment ?Pain Assessment: 0-10 ?Pain Score: 5  ?Pain Location: L  forearm (prev IV infiltration site, gauze wrapped), scrotum (edema), incisional sites ?Pain Descriptors / Indicators: Discomfort, Grimacing, Tender, Operative site guarding ?Pain Intervention(s): Limited activity within patient's tolerance, Monitored during session, Repositioned, Other (comment) (verbal instruction on pillowcase technique for scrotal elevation)  ? ? ?Home Living   ?  ?  ?  ?  ?  ?  ?  ?  ?  ?   ?  ?Prior Function    ?  ?  ?   ? ?PT Goals (current goals can now be found in the care plan section) Acute Rehab PT Goals ?PT Goal Formulation: With patient ?Time For Goal Achievement: 12/12/21 ?Progress towards PT goals: Progressing toward goals ? ?  ?Frequency ? ? ? Min 3X/week ? ? ? ?  ?PT Plan Current plan remains appropriate  ? ? ?Co-evaluation   ?  ?  ?  ?  ? ?  ?AM-PAC PT "6 Clicks" Mobility   ?Outcome Measure ? Help needed turning from your back to your side while in a flat bed without using bedrails?: A Little ?Help needed moving from lying on your back to sitting on the side of a flat bed without using bedrails?: A Little ?Help needed moving to and from a bed to a chair (including a wheelchair)?: A Little ?Help needed standing up from a chair using your arms (e.g., wheelchair or bedside chair)?: A Little ?Help needed to walk in hospital room?: A Little ?Help needed climbing 3-5 steps with a railing? : Total ?6 Click Score: 16 ? ?  ?End of Session Equipment Utilized During Treatment: Gait belt ?Activity Tolerance: Patient tolerated treatment well;Other (comment) (limited due to need for BM) ?Patient left: in chair;with call bell/phone within reach;Other (comment) (NT notified pt to press call bell when done on [facility], pt agreeable to press call bell) ?Nurse Communication: Mobility status;Precautions ?PT Visit Diagnosis: Unsteadiness on feet (R26.81);Other abnormalities of gait and mobility (R26.89);Muscle weakness (generalized) (M62.81);Difficulty in walking, not elsewhere classified (R26.2) ?   ? ? ?Time: 5465-6812 ?PT Time Calculation (min) (ACUTE ONLY): 15 min ? ?Charges:  $Therapeutic Activity: 8-22 mins          ?          ? ?Carling Liberman P., PTA ?Acute Rehabilitation Services ?Secure Chat Preferred 9a-5:30pm ?Office: [phone]  ? ? ?Kara Pacer Jaidin Richison ?12/02/2021, 4:00 PM ? ?

## 2021-12-02 NOTE — Progress Notes (Signed)
Upon assessment of this patient, this RN feels that this patient is not ready and will not be safe to go home. It is my opinion that this patient may need a couple more days to get more comfortable with mobility. Patient still has a lot of edema that is hindering his mobility.  ? ?Please consider this suggestion and do not d/c this patient until he feels like he can manage at home even with home health/PT/OT/RN ? ?Thank you,  ?nursing ?

## 2021-12-03 LAB — HEMOGLOBIN AND HEMATOCRIT, BLOOD
HCT: 21.5 % — ABNORMAL LOW (ref 39.0–52.0)
Hemoglobin: 6.7 g/dL — CL (ref 13.0–17.0)

## 2021-12-03 LAB — CBC
HCT: 20.4 % — ABNORMAL LOW (ref 39.0–52.0)
HCT: 23 % — ABNORMAL LOW (ref 39.0–52.0)
Hemoglobin: 6.6 g/dL — CL (ref 13.0–17.0)
Hemoglobin: 7.6 g/dL — ABNORMAL LOW (ref 13.0–17.0)
MCH: 30.8 pg (ref 26.0–34.0)
MCH: 30.9 pg (ref 26.0–34.0)
MCHC: 32.4 g/dL (ref 30.0–36.0)
MCHC: 33 g/dL (ref 30.0–36.0)
MCV: 95.3 fL (ref 80.0–100.0)
MCV: 93.5 fL (ref 80.0–100.0)
Platelets: 199 10*3/uL (ref 150–400)
Platelets: 195 10*3/uL (ref 150–400)
RBC: 2.14 MIL/uL — ABNORMAL LOW (ref 4.22–5.81)
RBC: 2.46 MIL/uL — ABNORMAL LOW (ref 4.22–5.81)
RDW: 19.6 % — ABNORMAL HIGH (ref 11.5–15.5)
RDW: 17.8 % — ABNORMAL HIGH (ref 11.5–15.5)
WBC: 11.8 10*3/uL — ABNORMAL HIGH (ref 4.0–10.5)
WBC: 10.1 10*3/uL (ref 4.0–10.5)
nRBC: 0.3 % — ABNORMAL HIGH (ref 0.0–0.2)
nRBC: 0.5 % — ABNORMAL HIGH (ref 0.0–0.2)

## 2021-12-03 LAB — OCCULT BLOOD X 1 CARD TO LAB, STOOL: Fecal Occult Bld: NEGATIVE

## 2021-12-03 LAB — HEPARIN LEVEL (UNFRACTIONATED): Heparin Unfractionated: 0.25 IU/mL — ABNORMAL LOW (ref 0.30–0.70)

## 2021-12-03 LAB — PREPARE RBC (CROSSMATCH)

## 2021-12-03 MED ORDER — HEPARIN SODIUM (PORCINE) 5000 UNIT/ML IJ SOLN
5000.0000 [IU] | Freq: Three times a day (TID) | INTRAMUSCULAR | Status: DC
  Administered 2021-12-03 – 2021-12-06 (×8): 5000 [IU] via SUBCUTANEOUS
  Filled 2021-12-03 (×6): qty 1

## 2021-12-03 MED ORDER — PANTOPRAZOLE SODIUM 40 MG IV SOLR
40.0000 mg | Freq: Two times a day (BID) | INTRAVENOUS | Status: DC
Start: 2021-12-03 — End: 2021-12-06
  Administered 2021-12-03 – 2021-12-06 (×6): 40 mg via INTRAVENOUS
  Filled 2021-12-03 (×6): qty 10

## 2021-12-03 NOTE — Progress Notes (Signed)
Physical Therapy Treatment ?Patient Details ?Name: Kevin Gross ?MRN: [id number] ?DOB: July 09, 1956 ?Today's Date: 12/03/2021 ? ? ?History of Present Illness Pt is a 66 y.o. male who presented 11/26/21 for bil femoral endarterectomy with patch angioplasty, bil iliac stenting, and bil infrarenal lithtripsy. Pt experienced extensive blood loss and was borught to the ICU intubated. ETT 3/31 - 4/2. PMH: alcoholic cirrhosis of liver, CHF, COPD, HLD, HTN, sleep apnea. ? ?  ?PT Comments  ? ? Focused session on progressing gait and on stair training in preparation for anticipated d/c home soon. Pt remains limited in mobility tolerance and progression by scrotal and groin pain though. He was only able to ambulate up to ~20 ft with a RW and min guard assist today. He was able to ascend and descend x1 stair with bil rails and min guard assist also. He would likely be able to navigate the x3 stairs to access his home if needed with guarding from someone for safety. Pt reports he will have assistance at d/c. Will continue to follow acutely. Current recommendations remain appropriate. ? ?   ?Recommendations for follow up therapy are one component of a multi-disciplinary discharge planning process, led by the attending physician.  Recommendations may be updated based on patient status, additional functional criteria and insurance authorization. ? ?Follow Up Recommendations ? Home health PT ?  ?  ?Assistance Recommended at Discharge Intermittent Supervision/Assistance  ?Patient can return home with the following A little help with walking and/or transfers;A little help with bathing/dressing/bathroom;Assistance with cooking/housework;Assist for transportation;Help with stairs or ramp for entrance ?  ?Equipment Recommendations ? BSC/3in1 (bariatric)  ?  ?Recommendations for Other Services   ? ? ?  ?Precautions / Restrictions Precautions ?Precautions: Fall;Other (comment) ?Precaution Comments: scrotal edema ?Restrictions ?Weight Bearing  Restrictions: No  ?  ? ?Mobility ? Bed Mobility ?Overal bed mobility: Needs Assistance ?Bed Mobility: Supine to Sit ?  ?  ?Supine to sit: Supervision, HOB elevated ?  ?  ?General bed mobility comments: Extra time and effort using bed rails to ascend trunk to sit R EOB, HOB elevated, supervision for safety ?  ? ?Transfers ?Overall transfer level: Needs assistance ?Equipment used: Rolling walker (2 wheels) ?Transfers: Sit to/from Stand, Bed to chair/wheelchair/BSC ?Sit to Stand: Min guard ?  ?Step pivot transfers: Min guard ?  ?  ?  ?General transfer comment: Extra time and cues to push up with 1 UE from bed or chair to stand, min guard for safety with all transfers. ?  ? ?Ambulation/Gait ?Ambulation/Gait assistance: Min guard ?Gait Distance (Feet): 20 Feet ?Assistive device: Rolling walker (2 wheels) ?Gait Pattern/deviations: Step-through pattern, Decreased stride length, Wide base of support, Trunk flexed ?Gait velocity: reduced ?Gait velocity interpretation: <1.31 ft/sec, indicative of household ambulator ?  ?General Gait Details: Slow, mostly steady gait with wide BOS and flexed posture due to swollen scrotum. Sling donned prior to walking. Min guard assist for safety, no LOB. Distance limited by groin and scrotum pain. ? ? ?Stairs ?Stairs: Yes ?Stairs assistance: Min guard ?Stair Management: Two rails, Step to pattern, Forwards ?Number of Stairs: 1 ?General stair comments: Ascends and descends 1 stair with bil rails with extra time and min guard for safety, no LOB. Educated pt to ascend with strong leg and descend with weak/bad leg if he has one. ? ? ?Wheelchair Mobility ?  ? ?Modified Rankin (Stroke Patients Only) ?  ? ? ?  ?Balance Overall balance assessment: Needs assistance ?Sitting-balance support: No upper extremity supported, Feet supported ?Sitting  balance-Leahy Scale: Fair ?Sitting balance - Comments: posterior lean sitting upright 2/2 groin pain/edema ?Postural control: Posterior lean ?Standing balance  support: During functional activity, Bilateral upper extremity supported ?Standing balance-Leahy Scale: Poor ?Standing balance comment: Reliant on RW ?  ?  ?  ?  ?  ?  ?  ?  ?  ?  ?  ?  ? ?  ?Cognition Arousal/Alertness: Awake/alert ?Behavior During Therapy: [facility] for tasks assessed/performed ?Overall Cognitive Status: Within Functional Limits for tasks assessed ?  ?  ?  ?  ?  ?  ?  ?  ?  ?  ?  ?  ?  ?  ?  ?  ?General Comments: pleasantly cooperative, good following of 1 and 2-step instructions ?  ?  ? ?  ?Exercises   ? ?  ?General Comments   ?  ?  ? ?Pertinent Vitals/Pain Pain Assessment ?Pain Assessment: Faces ?Faces Pain Scale: Hurts whole lot ?Pain Location: scrotum, groins ?Pain Descriptors / Indicators: Discomfort, Grimacing, Guarding, Throbbing, Burning ?Pain Intervention(s): Limited activity within patient's tolerance, Monitored during session, Repositioned  ? ? ?Home Living   ?  ?  ?  ?  ?  ?  ?  ?  ?  ?   ?  ?Prior Function    ?  ?  ?   ? ?PT Goals (current goals can now be found in the care plan section) Acute Rehab PT Goals ?Patient Stated Goal: to improve ?PT Goal Formulation: With patient ?Time For Goal Achievement: 12/12/21 ?Potential to Achieve Goals: Good ?Progress towards PT goals: Progressing toward goals ? ?  ?Frequency ? ? ? Min 3X/week ? ? ? ?  ?PT Plan Equipment recommendations need to be updated  ? ? ?Co-evaluation   ?  ?  ?  ?  ? ?  ?AM-PAC PT "6 Clicks" Mobility   ?Outcome Measure ? Help needed turning from your back to your side while in a flat bed without using bedrails?: A Little ?Help needed moving from lying on your back to sitting on the side of a flat bed without using bedrails?: A Little ?Help needed moving to and from a bed to a chair (including a wheelchair)?: A Little ?Help needed standing up from a chair using your arms (e.g., wheelchair or bedside chair)?: A Little ?Help needed to walk in hospital room?: A Little ?Help needed climbing 3-5 steps with a railing? : Total ?6 Click  Score: 16 ? ?  ?End of Session Equipment Utilized During Treatment: Gait belt ?Activity Tolerance: Patient limited by pain ?Patient left: with call bell/phone within reach;Other (comment) (on commode) ?Nurse Communication: Mobility status;Other (comment) (pain; pt positioned on commode with alarm remote in reach) ?PT Visit Diagnosis: Unsteadiness on feet (R26.81);Other abnormalities of gait and mobility (R26.89);Muscle weakness (generalized) (M62.81);Difficulty in walking, not elsewhere classified (R26.2) ?  ? ? ?Time: 6767-2094 ?PT Time Calculation (min) (ACUTE ONLY): 35 min ? ?Charges:  $Gait Training: 8-22 mins ?$Therapeutic Activity: 8-22 mins          ?          ? ?Moishe Spice, PT, DPT ?Acute Rehabilitation Services  ?Pager: [phone] ?Office: [phone] ? ? ? ?Maretta Bees Pettis ?12/03/2021, 11:32 AM ? ?

## 2021-12-03 NOTE — Progress Notes (Signed)
Dr. Virl Cagey notified that second unit of blood is being administered now.  Per Dr. Virl Cagey, heparin is to remain off tonight, repeat CBC 2hrs s/p infusion and in a.m. ?

## 2021-12-03 NOTE — Progress Notes (Signed)
Patient is able to self-administer CPAP.  Patient is familiar with equipment and procedure.  He is using his home equipment.  ?

## 2021-12-03 NOTE — Progress Notes (Addendum)
?Progress Note ? ? ? ?12/03/2021 ?8:30 AM ?7 Days Post-Op ? ?Subjective:  does not feel ready to go home ? ? ?Vitals:  ? 12/03/21 0402 12/03/21 0810  ?BP: (!) 115/59 101/65  ?Pulse: 98 92  ?Resp: 13 18  ?Temp: 97.7 ?F (36.5 ?C) 99.5 ?F (37.5 ?C)  ?SpO2: 100% 99%  ? ?Physical Exam: ?Cardiac:  regular ?Lungs:  non labored ?Incisions:  bilateral ?Extremities:  Bilateral lower extremities well perfused. Doppler Dp/PT bilaterally ?Abdomen:  obese, soft ?Neurologic: alert and oriented  ? ?CBC ?   ?Component Value Date/Time  ? WBC 11.8 (H) 12/03/2021 0622  ? RBC 2.14 (L) 12/03/2021 0622  ? HGB 6.6 (LL) 12/03/2021 0622  ? HGB 13.3 10/12/2021 1314  ? HCT 20.4 (L) 12/03/2021 0622  ? HCT 39.4 10/12/2021 1314  ? PLT 199 12/03/2021 0622  ? PLT 175 10/12/2021 1314  ? MCV 95.3 12/03/2021 0622  ? MCV 97 10/12/2021 1314  ? MCH 30.8 12/03/2021 0622  ? MCHC 32.4 12/03/2021 0622  ? RDW 19.6 (H) 12/03/2021 0622  ? RDW 13.1 10/12/2021 1314  ? LYMPHSABS 0.9 11/28/2021 1151  ? LYMPHSABS 1.9 10/12/2021 1314  ? MONOABS 1.3 (H) 11/28/2021 1151  ? EOSABS 0.0 11/28/2021 1151  ? EOSABS 0.7 (H) 10/12/2021 1314  ? BASOSABS 0.0 11/28/2021 1151  ? BASOSABS 0.1 10/12/2021 1314  ? ? ?BMET ?   ?Component Value Date/Time  ? NA 138 12/01/2021 0136  ? NA 145 (H) 10/12/2021 1314  ? K 3.5 12/01/2021 0136  ? CL 105 12/01/2021 0136  ? CO2 23 12/01/2021 0136  ? GLUCOSE 122 (H) 12/01/2021 0136  ? BUN 37 (H) 12/01/2021 0136  ? BUN 37 (H) 10/12/2021 1314  ? CREATININE 1.83 (H) 12/01/2021 0136  ? CALCIUM 8.1 (L) 12/01/2021 0136  ? GFRNONAA 41 (L) 12/01/2021 0136  ? GFRAA 56 (L) 10/26/2019 0551  ? ? ?INR ?   ?Component Value Date/Time  ? INR 1.0 11/23/2021 1430  ? ? ? ?Intake/Output Summary (Last 24 hours) at 12/03/2021 0830 ?Last data filed at 12/03/2021 7035 ?Gross per 24 hour  ?Intake 306 ml  ?Output 1050 ml  ?Net -744 ml  ? ? ? ?Assessment/Plan:  66 y.o. male is s/p  ?#1: Right iliofemoral endarterectomy with vein patch angioplasty ?#2: Left iliofemoral  endarterectomy with vein patch angioplasty ?#3: Stent, left common iliac artery ?#4: Stent, left external iliac artery ?#5: Intra-arterial lithotripsy: Left common and external iliac arteries ?#6: Intra-arterial lithotripsy: Right common and external iliac arteries ?#7: Stent, right external iliac artery ?#8: Stent, right common femoral artery ?#9: Bilateral groin Praveena incisional wound VAC   7 Days Post-Op  ? ?BLE well perfused by doppler exam ?Incisional vac on both groins removed. Incisions c/d/I. There is fullness in both groins. No active drainage. Will need to monitor ?TOC arranged HH PT/RN ?Patient in Afib. Will home Heparin today  ?HH 6.6/20. Will order 2 Units PRBC ?Concern for GI bleed. Stool Hemoccult ordered ?Will get repeat labs post transfusion ? ? ?Karoline Caldwell, PA-C ?[facility] ?[phone] ?12/03/2021 ?8:30 AM ? ?VASCULAR STAFF ADDENDUM: ?I have independently interviewed and examined the patient. ?I agree with the above.  ?Drop in Hct, RBCs today ?Will check hemoccult  and give PPI ?Incisions soft, appears to be healing well.  ?Holding anticoagulation. Remains in afib. Pt okay with and understands this must be held in the setting of bleeding workup and RBC administration. ? ?J. Melene Muller, MD ?[facility] ?  Office Phone Number: [phone] ?12/03/2021 9:18 AM ? ? ?

## 2021-12-03 NOTE — Progress Notes (Addendum)
ANTICOAGULATION CONSULT NOTE  ? ?Pharmacy Consult for heparin ?Indication: atrial fibrillation ? ?No Known Allergies ? ?Patient Measurements: ?Height: '5\' 8"'$  (172.7 cm) ?Weight: (!) 145.7 kg (321 lb 4.8 oz) ?IBW/kg (Calculated) : 68.4 ?Heparin Dosing Weight: 104kg ? ?Vital Signs: ?Temp: 97.7 ?F (36.5 ?C) (04/07 0402) ?Temp Source: Oral (04/07 0402) ?BP: 115/59 (04/07 0402) ?Pulse Rate: 98 (04/07 0402) ? ?Labs: ?Recent Labs  ?  12/01/21 ?0136 12/01/21 ?1205 12/01/21 ?1934 12/02/21 ?1134 12/03/21 ?0622  ?HGB 9.8*  --   --  7.2* 6.6*  ?HCT 30.7*  --   --  21.9* 20.4*  ?PLT 177  --   --  158 199  ?HEPARINUNFRC <0.10*   < > 0.34 0.33 0.25*  ?CREATININE 1.83*  --   --   --   --   ? < > = values in this interval not displayed.  ? ? ? ?Estimated Creatinine Clearance: 57.3 mL/min (A) (by C-G formula based on SCr of 1.83 mg/dL (H)). ? ? ?Medical History: ?Past Medical History:  ?Diagnosis Date  ? Alcoholic cirrhosis of liver ([place])   ? CHF (congestive heart failure) ([place])   ? Clotting disorder ([place])   ? Bilateral legs-pt states plaque build up in both legs requiring intervention  ? Complication of anesthesia   ? pt states that years ago during colonoscopy at [facility] they had to abort the procedure due to some "cardiac issue" had to see cardiologist  ? COPD (chronic obstructive pulmonary disease) ([place])   ? Hyperlipidemia   ? Hypertension   ? Pre-diabetes   ? Sleep apnea   ? uses cpap  ? ? ?Assessment: ?66 year old male with new afib. Anemia continues today, hgb down to 8.6 and plt low but stable at 84. No overt bleeding noted. New orders to transition patient from SQ heparin to IV heparin for his afib.  ? ?Heparin level slightly subtherapeutic this AM ? ?Goal of Therapy:  ?Heparin level 0.3-0.7 units/ml ?Monitor platelets by anticoagulation protocol: Yes ?  ?Plan:  ?Increase heparin to 1750 unit / hr - > holding for drop in HgB ?Daily heparin level and CBC ?Transition to [place] soon if back in afib, otherwise planning home just on  DAPT. ? ?Thank you ?Anette Guarneri, PharmD ? 12/03/2021 7:56 AM  ? ?[facility] pharmacy phone numbers are listed on [email or web address] ? ? ? ? ? ? ? ?

## 2021-12-03 NOTE — Progress Notes (Signed)
Date and time results received: 12/03/21 0815 ?(use smartphrase ".now" to insert current time) ? ?Test: Hgb ?Critical Value: 6.6 ? ?Name of Provider Notified: Virl Cagey ? ?Orders Received? Or Actions Taken?: Orders Received - See Orders for details ? ?Dr. Virl Cagey notified, received order to redraw lab to confirm value.  Will follow up with MD with results. ?

## 2021-12-04 ENCOUNTER — Other Ambulatory Visit: Payer: Self-pay | Admitting: Student

## 2021-12-04 DIAGNOSIS — I480 Paroxysmal atrial fibrillation: Secondary | ICD-10-CM | POA: Diagnosis not present

## 2021-12-04 DIAGNOSIS — I4891 Unspecified atrial fibrillation: Secondary | ICD-10-CM

## 2021-12-04 DIAGNOSIS — I70219 Atherosclerosis of native arteries of extremities with intermittent claudication, unspecified extremity: Secondary | ICD-10-CM | POA: Diagnosis not present

## 2021-12-04 DIAGNOSIS — I739 Peripheral vascular disease, unspecified: Secondary | ICD-10-CM | POA: Diagnosis not present

## 2021-12-04 LAB — TYPE AND SCREEN
ABO/RH(D): A POS
Antibody Screen: NEGATIVE
Unit division: 0
Unit division: 0

## 2021-12-04 LAB — BPAM RBC
Blood Product Expiration Date: 202305032359
Blood Product Expiration Date: 202305042359
ISSUE DATE / TIME: 202304071245
ISSUE DATE / TIME: 202304071717
Unit Type and Rh: 6200
Unit Type and Rh: 6200

## 2021-12-04 LAB — CBC
HCT: 24.3 % — ABNORMAL LOW (ref 39.0–52.0)
Hemoglobin: 8 g/dL — ABNORMAL LOW (ref 13.0–17.0)
MCH: 30.5 pg (ref 26.0–34.0)
MCHC: 32.9 g/dL (ref 30.0–36.0)
MCV: 92.7 fL (ref 80.0–100.0)
Platelets: 209 10*3/uL (ref 150–400)
RBC: 2.62 MIL/uL — ABNORMAL LOW (ref 4.22–5.81)
RDW: 17.9 % — ABNORMAL HIGH (ref 11.5–15.5)
WBC: 8.8 10*3/uL (ref 4.0–10.5)
nRBC: 0.2 % (ref 0.0–0.2)

## 2021-12-04 MED ORDER — AMIODARONE HCL 200 MG PO TABS
200.0000 mg | ORAL_TABLET | Freq: Two times a day (BID) | ORAL | Status: DC
  Administered 2021-12-04 – 2021-12-06 (×5): 200 mg via ORAL
  Filled 2021-12-04 (×5): qty 1

## 2021-12-04 NOTE — Progress Notes (Signed)
Physical Therapy Treatment ?Patient Details ?Name: Kevin Gross ?MRN: [id number] ?DOB: 03/18/1956 ?Today's Date: 12/04/2021 ? ? ?History of Present Illness Pt is a 66 y.o. male who presented 11/26/21 for bil femoral endarterectomy with patch angioplasty, bil iliac stenting, and bil infrarenal lithtripsy. Pt experienced extensive blood loss and was borught to the ICU intubated. ETT 3/31 - 4/2. PMH: alcoholic cirrhosis of liver, CHF, COPD, HLD, HTN, sleep apnea. ? ?  ?PT Comments  ? ? Pt making excellent progress towards his physical therapy goals this session; verbalizing improved pain control. Pt ambulating 180 ft with a walker at a min guard assist level; requiring one standing rest break. Will trial bariatric Rollator next session as this is AD pt will likely use at home.  ?   ?Recommendations for follow up therapy are one component of a multi-disciplinary discharge planning process, led by the attending physician.  Recommendations may be updated based on patient status, additional functional criteria and insurance authorization. ? ?Follow Up Recommendations ? Home health PT ?  ?  ?Assistance Recommended at Discharge Intermittent Supervision/Assistance  ?Patient can return home with the following A little help with walking and/or transfers;A little help with bathing/dressing/bathroom;Assistance with cooking/housework;Assist for transportation;Help with stairs or ramp for entrance ?  ?Equipment Recommendations ? BSC/3in1 (bariatric)  ?  ?Recommendations for Other Services   ? ? ?  ?Precautions / Restrictions Precautions ?Precautions: Fall;Other (comment) ?Precaution Comments: scrotal edema ?Restrictions ?Weight Bearing Restrictions: No ?RLE Weight Bearing: Weight bearing as tolerated ?LLE Weight Bearing: Weight bearing as tolerated  ?  ? ?Mobility ? Bed Mobility ?Overal bed mobility: Needs Assistance ?Bed Mobility: Supine to Sit, Sit to Supine ?  ?  ?Supine to sit: Min assist ?Sit to supine: Min assist ?  ?General  bed mobility comments: MinA for truncal assist to upright. Assist for RLE back into bed ?  ? ?Transfers ?Overall transfer level: Needs assistance ?Equipment used: Rolling walker (2 wheels) ?Transfers: Sit to/from Stand ?Sit to Stand: Min assist ?  ?  ?  ?  ?  ?General transfer comment: Light minA to rise from edge of bed and recliner ?  ? ?Ambulation/Gait ?Ambulation/Gait assistance: Min guard ?Gait Distance (Feet): 180 Feet ?Assistive device: Rolling walker (2 wheels) ?Gait Pattern/deviations: Step-through pattern, Decreased stride length, Wide base of support, Trunk flexed ?Gait velocity: reduced ?  ?  ?General Gait Details: Slow, mostly steady gait with wide BOS and flexed posture due to swollen scrotum. Min guard assist for safety, no LOB. Pt requiring one standing rest break ? ? ?Stairs ?  ?  ?  ?  ?  ? ? ?Wheelchair Mobility ?  ? ?Modified Rankin (Stroke Patients Only) ?  ? ? ?  ?Balance Overall balance assessment: Needs assistance ?Sitting-balance support: No upper extremity supported, Feet supported ?Sitting balance-Leahy Scale: Fair ?  ?  ?Standing balance support: During functional activity, Bilateral upper extremity supported ?Standing balance-Leahy Scale: Poor ?Standing balance comment: Reliant on RW ?  ?  ?  ?  ?  ?  ?  ?  ?  ?  ?  ?  ? ?  ?Cognition Arousal/Alertness: Awake/alert ?Behavior During Therapy: [facility] for tasks assessed/performed ?Overall Cognitive Status: Within Functional Limits for tasks assessed ?  ?  ?  ?  ?  ?  ?  ?  ?  ?  ?  ?  ?  ?  ?  ?  ?  ?  ?  ? ?  ?Exercises   ? ?  ?  General Comments   ?  ?  ? ?Pertinent Vitals/Pain Pain Assessment ?Pain Assessment: Faces ?Faces Pain Scale: Hurts little more ?Pain Location: scrotum, groins ?Pain Descriptors / Indicators: Discomfort, Grimacing, Guarding, Throbbing, Burning ?Pain Intervention(s): Monitored during session  ? ? ?Home Living   ?  ?  ?  ?  ?  ?  ?  ?  ?  ?   ?  ?Prior Function    ?  ?  ?   ? ?PT Goals (current goals can now be found in  the care plan section) Acute Rehab PT Goals ?Patient Stated Goal: to improve ?Potential to Achieve Goals: Good ?Progress towards PT goals: Progressing toward goals ? ?  ?Frequency ? ? ? Min 3X/week ? ? ? ?  ?PT Plan Current plan remains appropriate  ? ? ?Co-evaluation   ?  ?  ?  ?  ? ?  ?AM-PAC PT "6 Clicks" Mobility   ?Outcome Measure ? Help needed turning from your back to your side while in a flat bed without using bedrails?: A Little ?Help needed moving from lying on your back to sitting on the side of a flat bed without using bedrails?: A Little ?Help needed moving to and from a bed to a chair (including a wheelchair)?: A Little ?Help needed standing up from a chair using your arms (e.g., wheelchair or bedside chair)?: A Little ?Help needed to walk in hospital room?: A Little ?Help needed climbing 3-5 steps with a railing? : A Lot ?6 Click Score: 17 ? ?  ?End of Session   ?Activity Tolerance: Patient tolerated treatment well ?Patient left: in bed;with call bell/phone within reach ?Nurse Communication: Mobility status ?PT Visit Diagnosis: Unsteadiness on feet (R26.81);Other abnormalities of gait and mobility (R26.89);Muscle weakness (generalized) (M62.81);Difficulty in walking, not elsewhere classified (R26.2) ?  ? ? ?Time: 6440-3474 ?PT Time Calculation (min) (ACUTE ONLY): 27 min ? ?Charges:  $Therapeutic Activity: 23-37 mins          ?          ? ?Kevin Gross, PT, DPT ?Acute Rehabilitation Services ?Pager [phone] ?Office [phone] ? ? ? ?Kevin Gross ?12/04/2021, 11:07 AM ? ?

## 2021-12-04 NOTE — Consult Note (Signed)
?Cardiology Consultation:  ? ?Patient ID: [name] ?MRN: [id number]; DOB: Feb 09, 1956 ? ?Admit date: 11/26/2021 ?Date of Consult: 12/04/2021 ? ?PCP:  Kevin Limerick, PA ?  ?[facility] Providers ?Cardiologist:  Kevin Rogue, MD  ?PV: Dr. Fletcher Gross ? ?Patient Profile:  ? ?Kevin Gross is a 66 y.o. male with a hx of CAD, HTN, HLD, COPD, OSA, PAD, liver cirrhosis and prior tobacco use who is being seen 12/04/2021 for the evaluation of post-operative atrial fibrillation at the request of Dr. Virl Gross. ? ?History of Present Illness:  ? ?[name] was recently evaluated by Dr. Fletcher Gross for PAD/Claudication and lower extremity angiography showed severe stenosis along the external iliac arteries and common femoral arteries bilaterally with no good endovascular options. Therefore, he was referred to Vascular Surgery and it was recommended to proceed with bilateral femoral endarterectomies and atherectomy versus lithotripsy of the iliacs and subsequent stenting. ? ?He underwent surgical repair on 11/26/2021 with bilateral femoral endarterectomies and vein patch angioplasty with iliac stenting. His initial post-operative course was complicated by acute respiratory failure due to shock secondary to acute blood loss anemia. Was extubated on 11/28/2021 and pressor support was weaned. Also had an AKI with creatinine peaked at 2.33. ? ?He did have intermittent episodes of atrial fibrillation starting on 11/28/2021 but would spontaneously convert to normal sinus rhythm. He had recurrent atrial fibrillation the following day and was started on Heparin. He developed worsening anemia with this and his hemoglobin was down to 6.6 on 12/03/2021, therefore Heparin was discontinued and he received 2 units PRBC's. Hemoccult was negative and his hemoglobin had improved to 8.0 today. Cardiology was asked to see today as it was felt he was back in recurrent atrial fibrillation. By review of telemetry, he is in normal sinus rhythm with frequent  PAC's. ? ?He is having some pain along his incision sites but this continues to improve.  Breathing has improved from yesterday since having received his transfusion.  No specific chest pain or palpitations.  No reports of active bleeding. ? ?Past Medical History:  ?Diagnosis Date  ? Alcoholic cirrhosis of liver ([place])   ? CHF (congestive heart failure) ([place])   ? Clotting disorder ([place])   ? Bilateral legs-pt states plaque build up in both legs requiring intervention  ? Complication of anesthesia   ? pt states that years ago during colonoscopy at [facility] they had to abort the procedure due to some "cardiac issue" had to see cardiologist  ? COPD (chronic obstructive pulmonary disease) ([place])   ? Hyperlipidemia   ? Hypertension   ? Pre-diabetes   ? Sleep apnea   ? uses cpap  ? ? ?Past Surgical History:  ?Procedure Laterality Date  ? ABDOMINAL AORTOGRAM W/LOWER EXTREMITY N/A 10/20/2021  ? Procedure: ABDOMINAL AORTOGRAM W/LOWER EXTREMITY;  Surgeon: Wellington Hampshire, MD;  Location: [facility];  Service: Cardiovascular;  Laterality: N/A;  ? ANGIOPLASTY Left 11/26/2021  ? Procedure: LEFT INFRARENAL LITHOTRIPSY;  Surgeon: Kevin Mitchell, MD;  Location: [place];  Service: Vascular;  Laterality: Left;  ? CARDIAC CATHETERIZATION    ? Kevin Gross  ? COLONOSCOPY    ? COLONOSCOPY WITH PROPOFOL N/A 01/17/2018  ? Procedure: COLONOSCOPY WITH PROPOFOL;  Surgeon: Gross, Kevin Pike, MD;  Location: [facility];  Service: Gastroenterology;  Laterality: N/A;  ? ENDARTERECTOMY FEMORAL Bilateral 11/26/2021  ? Procedure: BILATERAL FEMORAL ENDARTERECTOMY WITH VEIN PATCH ANGIOPLASTY;  Surgeon: Kevin Mitchell, MD;  [facility]: [facility];  Service: Vascular;  Laterality: Bilateral;  ?  ESOPHAGOGASTRODUODENOSCOPY (EGD) WITH PROPOFOL N/A 01/17/2018  ? Procedure: ESOPHAGOGASTRODUODENOSCOPY (EGD) WITH PROPOFOL;  Surgeon: Gross, Kevin Pike, MD;  Location: [facility];  Service: Gastroenterology;  Laterality: N/A;  ? INSERTION OF ILIAC STENT Bilateral  11/26/2021  ? Procedure: BILATERAL ILIAC STENTING USING 83mX80mm INNOVA STENT ON LEFT ILIAC AND 833m59mm, 35m32m9mm VBX AND 35mm70m5cm VIABAHN STENT ON RIGHT ILIAC;  Surgeon: Kevin Gross;  [facility];  Laterality: Bilateral;  ? ORIF ANKLE FRACTURE Right   ? PROSTATE BIOPSY N/A 04/01/2021  ? Procedure: PROSTATE BIOPSY URONAV;  Surgeon: WolfRoyston Gross;  Location: [facility];  Service: Urology;  Laterality: N/A;  ? VEIN HARVEST Bilateral 11/26/2021  ? Procedure: VEIN HARVEST OF BILATERAL SPAHENOUS VEINS;  Surgeon: Kevin Gross;  [facility];  Service: Vascular;  Laterality: Bilateral;  ?  ? ?Home Medications:  ?Prior to Admission medications   ?Medication Sig Start Date End Date Taking? Authorizing Provider  ?allopurinol (ZYLOPRIM) 300 MG tablet Take 300 mg by mouth every morning. 12/29/20  Yes [provider]  ?aspirin EC 81 MG tablet Take 81 mg by mouth every morning.   Yes [provider]  ?atenolol (TENORMIN) 25 MG tablet Take 25 mg by mouth every morning. 10/13/20  Yes [provider]  ?chlorthalidone (HYGROTON) 25 MG tablet Take 25 mg by mouth every morning. 12/29/20  Yes [provider]  ?ezetimibe (ZETIA) 10 MG tablet Take 1 tablet (10 mg total) by mouth daily. 04/10/18  Yes GollMinna Merritts  ?lisinopril (ZESTRIL) 10 MG tablet Take 10 mg by mouth every morning. 12/29/20  Yes [provider]  ?simvastatin (ZOCOR) 20 MG tablet Take 20 mg by mouth every evening. 12/29/20  Yes [provider]  ?tamsulosin (FLOMAX) 0.4 MG CAPS capsule Take 0.4 mg by mouth daily after supper.   Yes [provider]  ?triamcinolone cream (KENALOG) 0.1 % Apply 1 application. topically daily as needed (irritation on foot).   Yes [provider]  ?VITAMIN D-1000 MAX ST 25 MCG (1000 UT) tablet Take 2,000 Units by mouth daily. 12/29/20  Yes [provider]  ?zolpidem (AMBIEN) 10 MG tablet Take 10 mg by mouth at bedtime.  12/29/20  Yes [provider]  ?acetaminophen (TYLENOL) 325 MG tablet Take 2 tablets (650 mg total) by mouth every 6 (six) hours as needed for mild pain (or Fever >/= 101). 02/22/19   VachVaughan Basta  ?albuterol (PROVENTIL HFA;VENTOLIN HFA) 108 (90 Base) MCG/ACT inhaler Inhale 1-2 puffs into the lungs every 6 (six) hours as needed for wheezing or shortness of breath.    [provider]  ?fluticasone (FLONASE) 50 MCG/ACT nasal spray Place 2 sprays into both nostrils daily as needed for allergies or rhinitis.    [provider]  ?predniSONE (DELTASONE) 10 MG tablet Take 10-40 mg by mouth See admin instructions. Take as needed at the onset of a gout flare, take 40 mg on day 1, 30 mg on day 2, 20 mg on day 3, 10 mg on day 4, then stop    [provider]  ? ? ?Inpatient Medications: ?Scheduled Meds: ? allopurinol  300 mg Oral BH-q7a  ? amiodarone  200 mg Oral BID  ? aspirin EC  81 mg Oral BH-q7a  ? clopidogrel  75 mg Oral Daily  ? docusate sodium  100 mg Oral Daily  ? ezetimibe  10 mg Oral Daily  ? heparin injection (subcutaneous)  5,000 Units Subcutaneous Q8H  ?  multivitamin with minerals  1 tablet Oral Daily  ? pantoprazole (PROTONIX) IV  40 mg Intravenous Q12H  ? polyethylene glycol  17 g Oral Daily  ? Ensure Max Protein  11 oz Oral BID  ? silver sulfADIAZINE   Topical BID  ? simvastatin  20 mg Oral QPM  ? zolpidem  10 mg Oral QHS  ? ?Continuous Infusions: ? sodium chloride    ? sodium chloride Stopped (11/29/21 0456)  ? ?PRN Meds: ?sodium chloride, sodium chloride, acetaminophen **OR** acetaminophen, albuterol, albuterol, alum & mag hydroxide-simeth, guaiFENesin-dextromethorphan, hydrALAZINE, labetalol, metoprolol tartrate, morphine injection, ondansetron, oxyCODONE-acetaminophen, phenol, potassium chloride ? ?Allergies:   No Known Allergies ? ?Social History:   ?Social History  ? ?Socioeconomic History  ? Marital status: Married  ?  Spouse name: Adelle  ? Number of  children: 2  ? Years of education: Not on file  ? Highest education level: Not on file  ?Occupational History  ? Not on file  ?Tobacco Use  ? Smoking status: Former  ?  Packs/day: 1.00  ?  Years: 47.00  ?  Pack years: 47.00

## 2021-12-04 NOTE — Progress Notes (Addendum)
?  Progress Note ? ? ? ?12/04/2021 ?7:07 AM ?8 Days Post-Op ? ?Subjective:  feeling better this morning ? ? ?Vitals:  ? 12/03/21 2312 12/04/21 0341  ?BP: 120/68 117/69  ?Pulse: 95 96  ?Resp: 17 20  ?Temp: 98.6 ?F (37 ?C) 98.5 ?F (36.9 ?C)  ?SpO2: 97% 100%  ? ?Physical Exam: ?Cardiac:  regular ?Lungs:  non labored ?Incisions:  B groin incisions are c/d/I without swelling or hematoma. Some fullness  ?Extremities:  well perfused and warm ?Abdomen:  obese, soft ?Neurologic: alert and oriented ? ?CBC ?   ?Component Value Date/Time  ? WBC 8.8 12/04/2021 0633  ? RBC 2.62 (L) 12/04/2021 4825  ? HGB 8.0 (L) 12/04/2021 0037  ? HGB 13.3 10/12/2021 1314  ? HCT 24.3 (L) 12/04/2021 0488  ? HCT 39.4 10/12/2021 1314  ? PLT 209 12/04/2021 0633  ? PLT 175 10/12/2021 1314  ? MCV 92.7 12/04/2021 0633  ? MCV 97 10/12/2021 1314  ? MCH 30.5 12/04/2021 0633  ? MCHC 32.9 12/04/2021 0633  ? RDW 17.9 (H) 12/04/2021 8916  ? RDW 13.1 10/12/2021 1314  ? LYMPHSABS 0.9 11/28/2021 1151  ? LYMPHSABS 1.9 10/12/2021 1314  ? MONOABS 1.3 (H) 11/28/2021 1151  ? EOSABS 0.0 11/28/2021 1151  ? EOSABS 0.7 (H) 10/12/2021 1314  ? BASOSABS 0.0 11/28/2021 1151  ? BASOSABS 0.1 10/12/2021 1314  ? ? ?BMET ?   ?Component Value Date/Time  ? NA 138 12/01/2021 0136  ? NA 145 (H) 10/12/2021 1314  ? K 3.5 12/01/2021 0136  ? CL 105 12/01/2021 0136  ? CO2 23 12/01/2021 0136  ? GLUCOSE 122 (H) 12/01/2021 0136  ? BUN 37 (H) 12/01/2021 0136  ? BUN 37 (H) 10/12/2021 1314  ? CREATININE 1.83 (H) 12/01/2021 0136  ? CALCIUM 8.1 (L) 12/01/2021 0136  ? GFRNONAA 41 (L) 12/01/2021 0136  ? GFRAA 56 (L) 10/26/2019 0551  ? ? ?INR ?   ?Component Value Date/Time  ? INR 1.0 11/23/2021 1430  ? ? ? ?Intake/Output Summary (Last 24 hours) at 12/04/2021 0707 ?Last data filed at 12/04/2021 0600 ?Gross per 24 hour  ?Intake 1637 ml  ?Output 1100 ml  ?Net 537 ml  ? ? ? ?Assessment/Plan:  66 y.o. male is s/p   ?#1: Right iliofemoral endarterectomy with vein patch angioplasty ?#2: Left iliofemoral  endarterectomy with vein patch angioplasty ?#3: Stent, left common iliac artery ?#4: Stent, left external iliac artery ?#5: Intra-arterial lithotripsy: Left common and external iliac arteries ?#6: Intra-arterial lithotripsy: Right common and external iliac arteries ?#7: Stent, right external iliac artery ?#8: Stent, right common femoral artery ?#9: Bilateral groin Praveena incisional wound VAC     ? ?BLE well perfused by doppler exam ?B groin Incisions c/d/I. Keep dry gauze to wick moisture ?TOC arranged HH PT/RN ?Patient remains in Afib. Holding heparin due to Acute blood loss anemia. Consult Cardiology for eval ?Hemoccult negative ?H&H 8/23 post 2 Units PRBC ?Morning labs pending ?Continue to mobilize as tolerated ? ?Karoline Caldwell, PA-C ?[facility] ?[phone] ?12/04/2021 ?7:07 AM ? ?VASCULAR STAFF ADDENDUM: ?I have independently interviewed and examined the patient. ?I agree with the above.  ? ? ?J. Melene Muller, MD ?[facility] ?Office Phone Number: [phone] ?12/04/2021 7:49 AM ? ? ?

## 2021-12-04 NOTE — Progress Notes (Signed)
Placed patient on home CPAP unit, per home settings. No further assistance needed from RT at this time.  ?

## 2021-12-04 NOTE — Progress Notes (Signed)
ANTICOAGULATION CONSULT NOTE  ? ?Pharmacy Consult for heparin ?Indication: atrial fibrillation ? ?No Known Allergies ? ?Patient Measurements: ?Height: '5\' 8"'$  (172.7 cm) ?Weight: (!) 142.8 kg (314 lb 14.4 oz) ?IBW/kg (Calculated) : 68.4 ?Heparin Dosing Weight: 104kg ? ?Vital Signs: ?Temp: 98.1 ?F (36.7 ?C) (04/08 0729) ?Temp Source: Oral (04/08 0729) ?BP: 80/59 (04/08 0729) ?Pulse Rate: 86 (04/08 0729) ? ?Labs: ?Recent Labs  ?  12/01/21 ?1934 12/02/21 ?1134 12/02/21 ?1134 12/03/21 ?0622 12/03/21 ?3254 12/03/21 ?2259 12/04/21 ?9826  ?HGB  --  7.2*   < > 6.6* 6.7* 7.6* 8.0*  ?HCT  --  21.9*   < > 20.4* 21.5* 23.0* 24.3*  ?PLT  --  158   < > 199  --  195 209  ?HEPARINUNFRC 0.34 0.33  --  0.25*  --   --   --   ? < > = values in this interval not displayed.  ? ? ? ?Estimated Creatinine Clearance: 56.6 mL/min (A) (by C-G formula based on SCr of 1.83 mg/dL (H)). ? ? ?Medical History: ?Past Medical History:  ?Diagnosis Date  ? Alcoholic cirrhosis of liver ([place])   ? CHF (congestive heart failure) ([place])   ? Clotting disorder ([place])   ? Bilateral legs-pt states plaque build up in both legs requiring intervention  ? Complication of anesthesia   ? pt states that years ago during colonoscopy at [facility] they had to abort the procedure due to some "cardiac issue" had to see cardiologist  ? COPD (chronic obstructive pulmonary disease) ([place])   ? Hyperlipidemia   ? Hypertension   ? Pre-diabetes   ? Sleep apnea   ? uses cpap  ? ? ?Assessment: ?66 year old male with new afib. Anemia continues today, hgb down to 8.6 and plt low but stable at 84. No overt bleeding noted. New orders to transition patient from SQ heparin to IV heparin for his afib.  ? ?Hemoglobin this AM has improved to 8 after transfusion. Platelets are stable. No bleeding noted per RN. ? ?Goal of Therapy:  ?Heparin level 0.3-0.7 units/ml ?Monitor platelets by anticoagulation protocol: Yes ?  ?Plan:  ?Heparin on hold for low hemoglobin per vascular ?Follow plans to resume heparin  or oral anticoagulation ?Daily heparin level and CBC ? ?Thank you for allowing pharmacy to participate in this patient's care. ? ?Reatha Harps, PharmD ?PGY1 Pharmacy Resident ?12/04/2021 7:40 AM ?Check [email or web address] for unit specific pharmacy number ? ? ? ? ? ? ? ? ?

## 2021-12-05 DIAGNOSIS — I739 Peripheral vascular disease, unspecified: Secondary | ICD-10-CM | POA: Diagnosis not present

## 2021-12-05 DIAGNOSIS — I480 Paroxysmal atrial fibrillation: Secondary | ICD-10-CM | POA: Diagnosis not present

## 2021-12-05 LAB — BASIC METABOLIC PANEL
Anion gap: 7 (ref 5–15)
BUN: 24 mg/dL — ABNORMAL HIGH (ref 8–23)
CO2: 24 mmol/L (ref 22–32)
Calcium: 8.1 mg/dL — ABNORMAL LOW (ref 8.9–10.3)
Chloride: 108 mmol/L (ref 98–111)
Creatinine, Ser: 1.66 mg/dL — ABNORMAL HIGH (ref 0.61–1.24)
GFR, Estimated: 46 mL/min — ABNORMAL LOW (ref >60)
Glucose, Bld: 101 mg/dL — ABNORMAL HIGH (ref 70–99)
Potassium: 4.2 mmol/L (ref 3.5–5.1)
Sodium: 139 mmol/L (ref 135–145)

## 2021-12-05 LAB — CBC
HCT: 26.7 % — ABNORMAL LOW (ref 39.0–52.0)
Hemoglobin: 8.1 g/dL — ABNORMAL LOW (ref 13.0–17.0)
MCH: 29.8 pg (ref 26.0–34.0)
MCHC: 30.3 g/dL (ref 30.0–36.0)
MCV: 98.2 fL (ref 80.0–100.0)
Platelets: 216 10*3/uL (ref 150–400)
RBC: 2.72 MIL/uL — ABNORMAL LOW (ref 4.22–5.81)
RDW: 17.9 % — ABNORMAL HIGH (ref 11.5–15.5)
WBC: 6.1 10*3/uL (ref 4.0–10.5)
nRBC: 0.7 % — ABNORMAL HIGH (ref 0.0–0.2)

## 2021-12-05 MED ORDER — ATENOLOL 25 MG PO TABS
25.0000 mg | ORAL_TABLET | ORAL | Status: DC
  Administered 2021-12-05 – 2021-12-06 (×2): 25 mg via ORAL
  Filled 2021-12-05 (×2): qty 1

## 2021-12-05 NOTE — Progress Notes (Signed)
RT gave patient a new mask to use with home CPAP. RT placed patient on CPAP at this time. RT will monitor as needed. ?

## 2021-12-05 NOTE — Plan of Care (Signed)
  Problem: Education: Goal: Knowledge of General Education information will improve Description Including pain rating scale, medication(s)/side effects and non-pharmacologic comfort measures Outcome: Progressing   

## 2021-12-05 NOTE — Progress Notes (Signed)
Mobility Specialist Progress Note  ? ? 12/05/21 1134  ?Mobility  ?Activity Ambulated with assistance in hallway  ?Level of Assistance Contact guard assist, steadying assist  ?Assistive Device Front wheel walker  ?Distance Ambulated (ft) 140 ft  ?Activity Response Tolerated fair  ?$Mobility charge 1 Mobility  ? ?Pre-Mobility: 85 HR, 118/69 BP, 100% SpO2 ?During Mobility: 109 HR ? ?Pt received in bed and agreeable. Stood from elevated bed. Pt SOB with exertion. Took x1 short standing rest break. Returned to sitting EOB with RN present.  ? ?Kevin Gross ?Mobility Specialist  ?  ?

## 2021-12-05 NOTE — Progress Notes (Signed)
? ?Primary Cardiologist:  Gollan/Arida ? ?Subjective:  ?Denies SSCP, palpitations or Dyspnea ?Ambulated in halls yesterday  ? ?Objective:  ?Vitals:  ? 12/04/21 1935 12/04/21 2237 12/04/21 2348 12/05/21 0353  ?BP: 126/78  (!) 129/51 (!) 132/58  ?Pulse: 81 74 87 83  ?Resp: '20 18 20 18  '$ ?Temp: 98.4 ?F (36.9 ?C)  98.2 ?F (36.8 ?C) 98.1 ?F (36.7 ?C)  ?TempSrc: Oral  Oral Oral  ?SpO2: 98% 95% 96% 96%  ?Weight:    (!) 147.4 kg  ?Height:      ? ? ?Intake/Output from previous day: ? ?Intake/Output Summary (Last 24 hours) at 12/05/2021 0720 ?Last data filed at 12/04/2021 2148 ?Gross per 24 hour  ?Intake 480 ml  ?Output 1500 ml  ?Net -1020 ml  ? ? ?Physical Exam: ?Obese male ?CPAP  ?Lungs clear ?Distant heart sounds ?Dressings on both groins ?Plus one bilateral edema with stasis  ? ?Lab Results: ?Basic Metabolic Panel: ?No results for input(s): NA, K, CL, CO2, GLUCOSE, BUN, CREATININE, CALCIUM, MG, PHOS in the last 72 hours. ?Liver Function Tests: ?No results for input(s): AST, ALT, ALKPHOS, BILITOT, PROT, ALBUMIN in the last 72 hours. ?No results for input(s): LIPASE, AMYLASE in the last 72 hours. ?CBC: ?Recent Labs  ?  12/03/21 ?2259 12/04/21 ?0633  ?WBC 10.1 8.8  ?HGB 7.6* 8.0*  ?HCT 23.0* 24.3*  ?MCV 93.5 92.7  ?PLT 195 209  ? ?Cardiac Enzymes: ?No results for input(s): CKTOTAL, CKMB, CKMBINDEX, TROPONINI in the last 72 hours. ?BNP: ?Invalid input(s): POCBNP ?D-Dimer: ?No results for input(s): DDIMER in the last 72 hours. ?Hemoglobin A1C: ?No results for input(s): HGBA1C in the last 72 hours. ?Fasting Lipid Panel: ?No results for input(s): CHOL, HDL, LDLCALC, TRIG, CHOLHDL, LDLDIRECT in the last 72 hours. ?Thyroid Function Tests: ?No results for input(s): TSH, T4TOTAL, T3FREE, THYROIDAB in the last 72 hours. ? ?Invalid input(s): FREET3 ?Anemia Panel: ?No results for input(s): VITAMINB12, FOLATE, FERRITIN, TIBC, IRON, RETICCTPCT in the last 72 hours. ? ?Imaging: ?No results found. ? ?Cardiac Studies: ? ECG:  ?Orders placed or  performed during the hospital encounter of 11/26/21  ? EKG 12-Lead  ? EKG 12-Lead  ? EKG 12-Lead  ? EKG 12-Lead  ? EKG 12-Lead  ? EKG 12-Lead  ? EKG 12-Lead  ? EKG 12-Lead  ? EKG 12-Lead  ? EKG 12-Lead  ? EKG 12-Lead  ? EKG 12-Lead  ?  ? Telemetry:  afib rates 80's  ? Echo: EF 55-60%  ? ?Medications: ?  ? allopurinol  300 mg Oral BH-q7a  ? amiodarone  200 mg Oral BID  ? aspirin EC  81 mg Oral BH-q7a  ? clopidogrel  75 mg Oral Daily  ? docusate sodium  100 mg Oral Daily  ? ezetimibe  10 mg Oral Daily  ? heparin injection (subcutaneous)  5,000 Units Subcutaneous Q8H  ? multivitamin with minerals  1 tablet Oral Daily  ? pantoprazole (PROTONIX) IV  40 mg Intravenous Q12H  ? polyethylene glycol  17 g Oral Daily  ? Ensure Max Protein  11 oz Oral BID  ? silver sulfADIAZINE   Topical BID  ? simvastatin  20 mg Oral QPM  ? zolpidem  10 mg Oral QHS  ? ?  ? sodium chloride    ? sodium chloride Stopped (11/29/21 0456)  ? ? ?Assessment/Plan:  ? ?Afib: paroxysmal in setting of anemia requiring transfusion Hct 24.3 and extensive PV procedure with stents needing DAT Outpatient monitor ordered on on amiodarone rates ok Favor DAT  for 4 weeks then per PV consider plavix only and starting DOAC after monitor done and can consider [facility] with Rx anticoagulation if afib persists He is asymptomatic with normal EF  CHADVASC 2  ? ?HLD:  continue statin and zetia ? ?Anemia:  stable Hct 24.3 see above regarding not wanting to be on triple Rx for now improved post 2 units PRBC;s  ? ?Jenkins Rouge ?12/05/2021, 7:20 AM ? ? ? ?

## 2021-12-05 NOTE — Progress Notes (Addendum)
?  Progress Note ? ? ? ?12/05/2021 ?8:06 AM ?9 Days Post-Op ? ?Subjective:  no complaints ? ? ?Vitals:  ? 12/04/21 2348 12/05/21 0353  ?BP: (!) 129/51 (!) 132/58  ?Pulse: 87 83  ?Resp: 20 18  ?Temp: 98.2 ?F (36.8 ?C) 98.1 ?F (36.7 ?C)  ?SpO2: 96% 96%  ? ?Physical Exam: ?Cardiac:  regular ?Lungs:  non labored ?Incisions:  B groin incisions c/d/I. Fullness present bu stable ?Extremities:  well perfused and warm with brisk Dp/ PT bilaterally. Left arm just redressed. 2+ left radial pulse. 5/5 grip strength. Hand warm and well perfused ?Abdomen:  obese, soft ?Neurologic: alert and oriented ? ?CBC ?   ?Component Value Date/Time  ? WBC 8.8 12/04/2021 0633  ? RBC 2.62 (L) 12/04/2021 3016  ? HGB 8.0 (L) 12/04/2021 0109  ? HGB 13.3 10/12/2021 1314  ? HCT 24.3 (L) 12/04/2021 3235  ? HCT 39.4 10/12/2021 1314  ? PLT 209 12/04/2021 0633  ? PLT 175 10/12/2021 1314  ? MCV 92.7 12/04/2021 0633  ? MCV 97 10/12/2021 1314  ? MCH 30.5 12/04/2021 0633  ? MCHC 32.9 12/04/2021 0633  ? RDW 17.9 (H) 12/04/2021 5732  ? RDW 13.1 10/12/2021 1314  ? LYMPHSABS 0.9 11/28/2021 1151  ? LYMPHSABS 1.9 10/12/2021 1314  ? MONOABS 1.3 (H) 11/28/2021 1151  ? EOSABS 0.0 11/28/2021 1151  ? EOSABS 0.7 (H) 10/12/2021 1314  ? BASOSABS 0.0 11/28/2021 1151  ? BASOSABS 0.1 10/12/2021 1314  ? ? ?BMET ?   ?Component Value Date/Time  ? NA 138 12/01/2021 0136  ? NA 145 (H) 10/12/2021 1314  ? K 3.5 12/01/2021 0136  ? CL 105 12/01/2021 0136  ? CO2 23 12/01/2021 0136  ? GLUCOSE 122 (H) 12/01/2021 0136  ? BUN 37 (H) 12/01/2021 0136  ? BUN 37 (H) 10/12/2021 1314  ? CREATININE 1.83 (H) 12/01/2021 0136  ? CALCIUM 8.1 (L) 12/01/2021 0136  ? GFRNONAA 41 (L) 12/01/2021 0136  ? GFRAA 56 (L) 10/26/2019 0551  ? ? ?INR ?   ?Component Value Date/Time  ? INR 1.0 11/23/2021 1430  ? ? ? ?Intake/Output Summary (Last 24 hours) at 12/05/2021 0806 ?Last data filed at 12/04/2021 2148 ?Gross per 24 hour  ?Intake 240 ml  ?Output 1500 ml  ?Net -1260 ml  ? ? ? ?Assessment/Plan:  66 y.o. male is s/p   ?#1: Right iliofemoral endarterectomy with vein patch angioplasty ?#2: Left iliofemoral endarterectomy with vein patch angioplasty ?#3: Stent, left common iliac artery ?#4: Stent, left external iliac artery ?#5: Intra-arterial lithotripsy: Left common and external iliac arteries ?#6: Intra-arterial lithotripsy: Right common and external iliac arteries ?#7: Stent, right external iliac artery ?#8: Stent, right common femoral artery ?#9: Bilateral groin Praveena incisional wound VAC   9 Days Post-Op  ? ?BLE well perfused with brisk doppler signals ?B groin Incisions c/d/I. Keep dry gauze to wick moisture ?TOC arranged HH PT/RN ?Patient with PAF ?Appreciate Cardiology assistance ?Placed on Amio BID ?Continue Asa + Plavix + Statin ?Outpatient follow up with cardiology arranged ?Morning labs pending ?Continue to mobilize as tolerated ?Hopefully discharge home tomorrow ? ? ?Karoline Caldwell, PA-C ?[facility] ?[phone] ?12/05/2021 ?8:06 AM ? ?VASCULAR STAFF ADDENDUM: ?I have independently interviewed and examined the patient. ?I agree with the above.  ?Home tomorrow. Appreciate cardiology input  ? ?J. Melene Muller, MD ?[facility] ?Office Phone Number: [phone] ?12/05/2021 2:06 PM ? ? ?

## 2021-12-06 ENCOUNTER — Telehealth: Payer: Self-pay | Admitting: *Deleted

## 2021-12-06 ENCOUNTER — Other Ambulatory Visit (HOSPITAL_COMMUNITY): Payer: Self-pay

## 2021-12-06 DIAGNOSIS — I70219 Atherosclerosis of native arteries of extremities with intermittent claudication, unspecified extremity: Secondary | ICD-10-CM | POA: Diagnosis not present

## 2021-12-06 DIAGNOSIS — I4891 Unspecified atrial fibrillation: Secondary | ICD-10-CM

## 2021-12-06 MED ORDER — SILVER SULFADIAZINE 1 % EX CREA
TOPICAL_CREAM | Freq: Two times a day (BID) | CUTANEOUS | 0 refills | Status: DC
  Filled 2021-12-06: qty 85, 30d supply, fill #0

## 2021-12-06 MED ORDER — CLOPIDOGREL BISULFATE 75 MG PO TABS
75.0000 mg | ORAL_TABLET | Freq: Every day | ORAL | 2 refills | Status: DC
  Filled 2021-12-06: qty 30, 30d supply, fill #0

## 2021-12-06 MED ORDER — AMIODARONE HCL 200 MG PO TABS
200.0000 mg | ORAL_TABLET | Freq: Two times a day (BID) | ORAL | 2 refills | Status: DC
Start: 2021-12-06 — End: 2022-09-09
  Filled 2021-12-06: qty 30, 15d supply, fill #0

## 2021-12-06 MED ORDER — OXYCODONE-ACETAMINOPHEN 5-325 MG PO TABS
1.0000 | ORAL_TABLET | Freq: Four times a day (QID) | ORAL | 0 refills | Status: DC | PRN
  Filled 2021-12-06: qty 30, 8d supply, fill #0

## 2021-12-06 NOTE — Discharge Summary (Signed)
?Bypass Discharge Summary ?Patient ID: ?Kevin Gross ?[id number] ?66 y.o. ?October 13, 1955 ? ?Admit date: 11/26/2021 ? ?Discharge date and time: 12/16/2021 ? ?Admitting Physician: Serafina Mitchell, MD  ? ?Discharge Physician: Macie Burows, MD ? 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 ?Discharge Diagnoses: PAD; Paroxysmal atrial fibrillation ? ?Admission Condition: poor ? ?Discharged Condition: fair ? ?Indication for Admission: This is a 66 year old gentleman with severe bilateral claudication.  Angiography revealed extensive iliofemoral disease.  Because of his comorbidities, he was not a candidate for aortobifemoral bypass graft.  I felt the next best option was bilateral femoral endarterectomy with iliac stenting. ? ?Hospital Course: Patient was admitted on 3/31 and underwent the following: ?#1: Right iliofemoral endarterectomy with vein patch angioplasty ?#2: Left iliofemoral endarterectomy with vein patch angioplasty ?#3: Stent, left common iliac artery ?#4: Stent, left external iliac artery ?#5: Intra-arterial lithotripsy: Left common and external iliac arteries ?#6: Intra-arterial lithotripsy: Right common and external iliac arteries ?#7: Stent, right external iliac artery ? #8: Stent, right common femoral artery ?#9: Bilateral groin Praveena incisional wound VAC ? ?Patient tolerated the procedure well. Due to the extent of the procedure he had extensive blood loss and pressor requirement and therefore he was kept intubated. His left forearm IV, in which his pressors were being administered infiltrated as well. He was there for taken to ICU intubated and in critical condition. Of note he also had a very loose right central incisor tooth that was concerning for being dislodged into airway. The tooth was therefore gently removed. Patients wife was notified of this. Critical care was consulted for  assistance with ventilator support and medical management post operatively.  ? ?POD#1, patient remained intubated. Bilateral lower extremities well perfused with brisk DP signals. Prevena VACs in bilateral groins intact to suction. No appreciable hematomas. Left arm IV infiltration site with blistering and mottled appearance. Left arm well perfused with Doppler radial signal. Hemodynamically stable following significant transfusions. Scr slightly up from baseline. Remained on pressure support. Attempted to wean but did not tolerate ? ?POD#2, patient able tolerate extubation. Placed on 4L [place] post extubation. Otherwise stable. Lower extremities remained well perfused and warm with doppler signals. Both groins with Prevena vac to suction. Left forearm remained stable. Scr trending down with improved urine output. Leukocytosis but suspected likely reactive. Hgb slightly down. No further transfusion indicated. Started weaning off of pressors. PT was able to evaluate patient with recommendation for Home health PT. ? ?POD#3, Doing better. Noted to be in atrial fibrillation. Started on IV heparin. Legs feeling better. Incisions intact and well appearing with Prevena VACs in place. Brisk doppler signals bilaterally. Left arm stable. Scr continued to trend down. Foley discontinued. Voiding without issues. Good urine output. Weaned off pressors. Hemodynamically stable. Leukocytosis trending down as well. Afebrile. Tolerated ambulating in halls. WOC RN consulted for wound care to left arm. Recommended continued application of Silvadene twice daily with Vaseline gauze and Kerlix.  ? ?POD#4, feeling much better. Lower extremities well perfused. Tolerating ambulating. Tolerating diet. Good urine output. Afebrile. Leukocytosis resolved. Feet feeling good. Incisions intact and Prevena VACs still to suction. Continued on Heparin IV. In normal sinus rhythm. Okay to transfer out of ICU. PT/ OT continued recommendation for Home health  services ? ?POD#5, patient eager to go home. Continued brisk pedal Doppler signals. left forearm painful but stable with local wound care.  Home health PT/OT and RN arranged with [facility]. Patient  later addressed concern that he did not feel ready to discharge home. Encouraged continued mobilization with therapy ? ?POD#7, Patient overall not feeling well. morning labs obtained showed drop in H&H to 6/20. 2 Units PRBC ordered for transfusion. Concern for possible GI bleed with no other concerns for bleeding at surgical sites. Prevena VACs in both groins removed. No hematoma or bleeding. Incisions well appearing. Stool Hemoccult obtained and negative. Given PPI. IV heparin held. Patient remained in atrial fibrillation. ? ?POD#8, feeling better post transfusion. H&H responded appropriately to transfusion. Continued to hold IV heparin. Remained in Atrial fibrillation. [facility] for evaluation of post operative Atrial fibrillation. Recommended not restarting IV heparin and placing patient on Amiodarone for Paroxysmal atrial fibrillation. Will order monitor for discharge and arranged outpatient follow up. Incisions and lower extremities remained well perfused and warm with brisk doppler signals. ? ?The remainder of his hospital course consisted of increased mobilization, pain control, incision site care, increased oral intake, monitoring of his hemodynamics and cardiac telemetry.  ? ?By POD #10, He remained stable for discharge home. Home Health PT/OT and RN arranged for patient through [place].PDMP was reviewed and post op pain medication was sent to the [place]. He was also prescribed. Amiodarone 200 mg BID, Plavix 75 mg daily, and Silvadene cream for his left arm IV site wound care. He will resume all home medications as prescribed. He will be discharged with heart rate monitor to evaluate his paroxysmal atrial fibrillation. Has outpatient follow up with his Cardiology PA on 12/27/21. He will follow up in  our office on 12/20/21 with Dr. Trula Slade for incision check.  ? ?Consults: cardiology ? ?Treatments: antibiotics: Cefazolin, analgesia: acetaminophen, Morphine, and Oxycodone-Acetaminophen, anticoagulation: ASA, Plavix, and LMW heparin, respiratory therapy: O2 and CPAP, therapies: PT, OT, and RN, and surgery:  ?#1: Right iliofemoral endarterectomy with vein patch angioplasty ?#2: Left iliofemoral endarterectomy with vein patch angioplasty ?#3: Stent, left common iliac artery ?#4: Stent, left external iliac artery ?#5: Intra-arterial lithotripsy: Left common and external iliac arteries ?#6: Intra-arterial lithotripsy: Right common and external iliac arteries ?#7: Stent, right external iliac artery ?#8: Stent, right common femoral artery ?#9: Bilateral groin Praveena incisional wound VAC ? ? ?Disposition: Discharge disposition: 01-Home or Self Care ? ? ? ? ? ? ?- For [facility] use --- ? ?Post-op:  ?Wound infection: No  ?Graft infection: No  ?Transfusion: Yes  If yes, 4 units given ?New Arrhythmia: Yes ?D/C Ambulatory Status: Ambulatory with Assistance ? ?Complications: ?MI: Valu.Nieves ] No, '[ ]'$  Troponin only, '[ ]'$  EKG or Clinical ?CHF: No ?Resp failure: [ X] none, '[ ]'$  Pneumonia, '[ ]'$  Ventilator ?Chg in renal function: Valu.Nieves ] none, '[ ]'$  Inc. Cr > 0.5, '[ ]'$  Temp. Dialysis, '[ ]'$  Permanent dialysis ?Stroke: [ X] None, '[ ]'$  Minor, '[ ]'$  Major ?Return to OR: No  ?Reason for return to OR: '[ ]'$  Bleeding, '[ ]'$  Infection, '[ ]'$  Thrombosis, '[ ]'$  Revision ? ?Discharge medications: ?Statin use:  Yes ?ASA use:  Yes ?Plavix use:  Yes ?Beta blocker use: Yes ?Coumadin use: No  for medical reason not indicated ? ? ? ?Patient Instructions:  ?Allergies as of 12/06/2021   ?No Known Allergies ?  ? ?  ?Medication List  ?  ? ?TAKE these medications   ? ?acetaminophen 325 MG tablet ?Commonly known as: TYLENOL ?Take 2 tablets (650 mg total) by mouth every 6 (six) hours as needed for mild pain (or Fever >/= 101). ?  ?albuterol 108 (90 Base)  MCG/ACT inhaler ?Commonly  known as: VENTOLIN HFA ?Inhale 1-2 puffs into the lungs every 6 (six) hours as needed for wheezing or shortness of breath. ?  ?allopurinol 300 MG tablet ?Commonly known as: ZYLOPRIM ?Take 300 mg by mouth

## 2021-12-06 NOTE — Progress Notes (Signed)
OT Cancellation Note ? ?Patient Details ?Name: Kevin Gross ?MRN: [id number] ?DOB: 07-28-1956 ? ? ?Cancelled Treatment:    Reason Eval/Treat Not Completed: Patient declined, no reason specified (Patient stated he had recently been out of bed with another therapist and asked to rest before attempting any more therapy.) ?Lodema Hong, OTA ?Acute Rehabilitation Services  ?Pager [phone] ?Office [phone] ? ?[place] ?12/06/2021, 9:11 AM ?

## 2021-12-06 NOTE — Progress Notes (Signed)
Physical Therapy Treatment ?Patient Details ?Name: Kevin Gross ?MRN: [id number] ?DOB: 1955-09-30 ?Today's Date: 12/06/2021 ? ? ?History of Present Illness Pt is a 66 y.o. male who presented 11/26/21 for bil femoral endarterectomy with patch angioplasty, bil iliac stenting, and bil infrarenal lithtripsy. Pt experienced extensive blood loss and was borught to the ICU intubated. ETT 3/31 - 4/2. PMH: alcoholic cirrhosis of liver, CHF, COPD, HLD, HTN, sleep apnea. ? ?  ?PT Comments  ? ? Pt received in bed. Assisted to/from bathroom prior to ambulating in hallway. Pt required min guard assist supine to sit, min assist sit to stand, min guard assist ambulation 150' with bari rollator, and min assist sit to supine. 2/4 DOE with amb. SpO2 98%. Scrotal sling in place for amb. Pt supine in bed at end of session. ?   ?Recommendations for follow up therapy are one component of a multi-disciplinary discharge planning process, led by the attending physician.  Recommendations may be updated based on patient status, additional functional criteria and insurance authorization. ? ?Follow Up Recommendations ? Home health PT ?  ?  ?Assistance Recommended at Discharge Intermittent Supervision/Assistance  ?Patient can return home with the following A little help with walking and/or transfers;A little help with bathing/dressing/bathroom;Assistance with cooking/housework;Assist for transportation;Help with stairs or ramp for entrance ?  ?Equipment Recommendations ? BSC/3in1 (bariatric)  ?  ?Recommendations for Other Services   ? ? ?  ?Precautions / Restrictions Precautions ?Precautions: Fall;Other (comment) ?Precaution Comments: scrotal edema ?Restrictions ?RLE Weight Bearing: Weight bearing as tolerated ?LLE Weight Bearing: Weight bearing as tolerated ?Other Position/Activity Restrictions: scrotal sling  ?  ? ?Mobility ? Bed Mobility ?Overal bed mobility: Needs Assistance ?Bed Mobility: Supine to Sit, Sit to Supine ?  ?  ?Supine to sit: Min  guard, HOB elevated ?Sit to supine: Min assist ?  ?General bed mobility comments: +rail, increased time, assist with BLE back to bed ?  ? ?Transfers ?Overall transfer level: Needs assistance ?Equipment used: Rolling walker (2 wheels) ?Transfers: Sit to/from Stand ?Sit to Stand: Min assist ?  ?  ?  ?  ?  ?General transfer comment: assist to power up, increased time ?  ? ?Ambulation/Gait ?Ambulation/Gait assistance: Min guard ?Gait Distance (Feet): 150 Feet ?Assistive device: Rollator (4 wheels) ?Gait Pattern/deviations: Step-through pattern, Wide base of support, Decreased stride length ?Gait velocity: decreased ?Gait velocity interpretation: <1.31 ft/sec, indicative of household ambulator ?  ?General Gait Details: steady gait with bari rollator. 2/4 DOE. SpO2 98% on RA. ? ? ?Stairs ?  ?  ?  ?  ?  ? ? ?Wheelchair Mobility ?  ? ?Modified Rankin (Stroke Patients Only) ?  ? ? ?  ?Balance Overall balance assessment: Needs assistance ?Sitting-balance support: No upper extremity supported, Feet supported ?Sitting balance-Leahy Scale: Good ?  ?  ?Standing balance support: Bilateral upper extremity supported, During functional activity, Reliant on assistive device for balance ?Standing balance-Leahy Scale: Poor ?  ?  ?  ?  ?  ?  ?  ?  ?  ?  ?  ?  ?  ? ?  ?Cognition Arousal/Alertness: Awake/alert ?Behavior During Therapy: [employer] for tasks assessed/performed ?Overall Cognitive Status: Within Functional Limits for tasks assessed ?  ?  ?  ?  ?  ?  ?  ?  ?  ?  ?  ?  ?  ?  ?  ?  ?  ?  ?  ? ?  ?Exercises   ? ?  ?General Comments General comments (skin  integrity, edema, etc.): scrotal sling donned for gait ?  ?  ? ?Pertinent Vitals/Pain Pain Assessment ?Pain Assessment: Faces ?Faces Pain Scale: Hurts little more ?Pain Location: scrotum ?Pain Descriptors / Indicators: Grimacing, Discomfort ?Pain Intervention(s): Monitored during session  ? ? ?Home Living   ?  ?  ?  ?  ?  ?  ?  ?  ?  ?   ?  ?Prior Function    ?  ?  ?   ? ?PT Goals  (current goals can now be found in the care plan section) Acute Rehab PT Goals ?Patient Stated Goal: home ?Progress towards PT goals: Progressing toward goals ? ?  ?Frequency ? ? ? Min 3X/week ? ? ? ?  ?PT Plan Current plan remains appropriate  ? ? ?Co-evaluation   ?  ?  ?  ?  ? ?  ?AM-PAC PT "6 Clicks" Mobility   ?Outcome Measure ? Help needed turning from your back to your side while in a flat bed without using bedrails?: A Little ?Help needed moving from lying on your back to sitting on the side of a flat bed without using bedrails?: A Little ?Help needed moving to and from a bed to a chair (including a wheelchair)?: A Little ?Help needed standing up from a chair using your arms (e.g., wheelchair or bedside chair)?: A Little ?Help needed to walk in hospital room?: A Little ?Help needed climbing 3-5 steps with a railing? : A Lot ?6 Click Score: 17 ? ?  ?End of Session Equipment Utilized During Treatment: Gait belt;Other (comment) (scrotal sling) ?Activity Tolerance: Patient tolerated treatment well ?Patient left: in bed;with call bell/phone within reach ?Nurse Communication: Mobility status ?PT Visit Diagnosis: Unsteadiness on feet (R26.81);Other abnormalities of gait and mobility (R26.89);Muscle weakness (generalized) (M62.81);Difficulty in walking, not elsewhere classified (R26.2) ?  ? ? ?Time: 3329-5188 ?PT Time Calculation (min) (ACUTE ONLY): 24 min ? ?Charges:  $Gait Training: 23-37 mins          ?          ? ?Lorrin Goodell, PT  ?Office # [phone] ?Pager [phone] ? ? ? ?Lorriane Shire ?12/06/2021, 8:57 AM ? ?

## 2021-12-06 NOTE — Telephone Encounter (Signed)
-----   Message from [place], PA-C sent at 12/06/2021 10:39 AM EDT ----- ?Pt needs hospital follow-up in 2 weeks for PAF, also needs 30 day heart monitor, this should be placed today in the hospital before he goes. If the heart monitor is not placed, need to send him 30 day monitor for afib.  ?Thanks ? ?

## 2021-12-06 NOTE — Care Management Important Message (Signed)
Important Message ? ?Patient Details  ?Name: Kevin Gross ?MRN: [id number] ?Date of Birth: 29-Dec-1955 ? ? ?Medicare Important Message Given:  Yes ? ? ? ? ?[name] ?12/06/2021, 12:28 PM ?

## 2021-12-06 NOTE — TOC Transition Note (Signed)
Transition of Care (TOC) - CM/SW Discharge Note ?Marvetta Gibbons Therapist, sports, BSN ?Transitions of Care ?Unit 4E- RN Case Manager ?See Treatment Team for direct phone #  ? ? ?Patient Details  ?Name: Kevin Gross ?MRN: [id number] ?Date of Birth: December 06, 1955 ? ?Transition of Care (TOC) CM/SW Contact:  ?Dahlia Client, Romeo Rabon, RN ?Phone Number: ?12/06/2021, 10:01 AM ? ? ?Clinical Narrative:    ?Pt stable for transition home today, [place] RN/PT/PT has been set up with Al[facility] Lemmings and Tommi Rumps has been notified of discharge home today for start of care.  ?No further TOC needs noted. Meds have been sent to [facility] to fill prior to discharge ? ? ?Final next level of care: [place] ?Barriers to Discharge: Barriers Resolved ? ? ?Patient Goals and CMS Choice ?Patient states their goals for this hospitalization and ongoing recovery are:: return home ?CMS Medicare.gov Compare Post Acute Care list provided to:: Patient ?Choice offered to / list presented to : Patient ? ?Discharge Placement ?  ?           ?  ?  ? Home w/ HH ?  ? ?Discharge Plan and Services ?  ?Discharge Planning Services: CM Consult ?Post Acute Care Choice: Home Health          ?DME Arranged: N/A ?DME Agency: NA ?  ?  ?  ?HH Arranged: RN, PT, OT ?[place] Agency: [facility] ?Date HH Agency Contacted: 12/02/21 ?Time HH Agency Contacted: 0900 ?Representative spoke with at [place]: Tommi Rumps ? ?Social Determinants of Health (SDOH) Interventions ?  ? ? ?Readmission Risk Interventions ? ?  12/06/2021  ? 10:01 AM  ?Readmission Risk Prevention Plan  ?Transportation Screening Complete  ?PCP or Specialist Appt within 5-7 Days Complete  ?Home Care Screening Complete  ?Medication Review (RN CM) Complete  ? ? ? ? ? ?

## 2021-12-06 NOTE — Progress Notes (Addendum)
?  Progress Note ? ? ? ?12/06/2021 ?7:31 AM ?10 Days Post-Op ? ?Subjective:  no complaints. Ready to go home today ? ? ?Vitals:  ? 12/05/21 2238 12/06/21 0200  ?BP:  133/75  ?Pulse: 70 65  ?Resp: 16 14  ?Temp:  98.1 ?F (36.7 ?C)  ?SpO2: 95% 98%  ? ?Physical Exam: ?Cardiac:  regular ?Lungs:  non labored ?Incisions:  Bilateral groin incisions c/d/i ?Extremities:  well perfused and warm. Brisk doppler DP/ PT bilaterally ?Neurologic: alert and oriented ? ?CBC ?   ?Component Value Date/Time  ? WBC 6.1 12/05/2021 1027  ? RBC 2.72 (L) 12/05/2021 1027  ? HGB 8.1 (L) 12/05/2021 1027  ? HGB 13.3 10/12/2021 1314  ? HCT 26.7 (L) 12/05/2021 1027  ? HCT 39.4 10/12/2021 1314  ? PLT 216 12/05/2021 1027  ? PLT 175 10/12/2021 1314  ? MCV 98.2 12/05/2021 1027  ? MCV 97 10/12/2021 1314  ? MCH 29.8 12/05/2021 1027  ? MCHC 30.3 12/05/2021 1027  ? RDW 17.9 (H) 12/05/2021 1027  ? RDW 13.1 10/12/2021 1314  ? LYMPHSABS 0.9 11/28/2021 1151  ? LYMPHSABS 1.9 10/12/2021 1314  ? MONOABS 1.3 (H) 11/28/2021 1151  ? EOSABS 0.0 11/28/2021 1151  ? EOSABS 0.7 (H) 10/12/2021 1314  ? BASOSABS 0.0 11/28/2021 1151  ? BASOSABS 0.1 10/12/2021 1314  ? ? ?BMET ?   ?Component Value Date/Time  ? NA 139 12/05/2021 1027  ? NA 145 (H) 10/12/2021 1314  ? K 4.2 12/05/2021 1027  ? CL 108 12/05/2021 1027  ? CO2 24 12/05/2021 1027  ? GLUCOSE 101 (H) 12/05/2021 1027  ? BUN 24 (H) 12/05/2021 1027  ? BUN 37 (H) 10/12/2021 1314  ? CREATININE 1.66 (H) 12/05/2021 1027  ? CALCIUM 8.1 (L) 12/05/2021 1027  ? GFRNONAA 46 (L) 12/05/2021 1027  ? GFRAA 56 (L) 10/26/2019 0551  ? ? ?INR ?   ?Component Value Date/Time  ? INR 1.0 11/23/2021 1430  ? ? ? ?Intake/Output Summary (Last 24 hours) at 12/06/2021 0731 ?Last data filed at 12/06/2021 0500 ?Gross per 24 hour  ?Intake --  ?Output 1200 ml  ?Net -1200 ml  ? ? ? ?Assessment/Plan:  66 y.o. male is s/p  ?#1: Right iliofemoral endarterectomy with vein patch angioplasty ?#2: Left iliofemoral endarterectomy with vein patch angioplasty ?#3:  Stent, left common iliac artery ?#4: Stent, left external iliac artery ?#5: Intra-arterial lithotripsy: Left common and external iliac arteries ?#6: Intra-arterial lithotripsy: Right common and external iliac arteries ?#7: Stent, right external iliac artery ?#8: Stent, right common femoral artery ?#9: Bilateral groin Praveena incisional wound VAC  10 Days Post-Op  ? ?BLE well perfused with brisk doppler signals ?B groin Incisions c/d/I. Keep dry gauze to wick moisture ?TOC arranged HH PT/RN ?Patient with PAF ?Appreciate Cardiology assistance ?Placed on Amio BID ?Continue Asa + Plavix + Statin ?Outpatient follow up with cardiology arranged on 5/1 ?H&H stable ?Will send prescriptions to [facility] pharmacy to be brought to pt room before d/c ?Stable for discharge home ?He will follow up with [facility] on 4/24 ? ?Karoline Caldwell, PA-C ?[facility] ?[phone] ?12/06/2021 ?7:31 AM ? ?VASCULAR STAFF ADDENDUM: ?I have independently interviewed and examined the patient. ?I agree with the above.  ?Pt asked to call our office should any questions or concerns arise ? ?J. Melene Muller, MD ?[facility] ?Office Phone Number: [phone] ?12/06/2021 1:51 PM ? ? ?

## 2021-12-06 NOTE — Progress Notes (Signed)
? ?Primary Cardiologist:  Gollan/Arida ? ?Subjective:  ?Breathing is OK  No CP  ? ?Objective:  ?Vitals:  ? 12/05/21 2100 12/05/21 2238 12/06/21 0200 12/06/21 0743  ?BP: (!) 140/55  133/75 (!) 109/58  ?Pulse: 72 70 65 63  ?Resp: '12 16 14 16  '$ ?Temp: 98.2 ?F (36.8 ?C)  98.1 ?F (36.7 ?C) 98.5 ?F (36.9 ?C)  ?TempSrc: Oral  Oral Oral  ?SpO2:  95% 98% 96%  ?Weight:      ?Height:      ? ? ?Intake/Output from previous day: ? ?Intake/Output Summary (Last 24 hours) at 12/06/2021 0826 ?Last data filed at 12/06/2021 0500 ?Gross per 24 hour  ?Intake --  ?Output 1200 ml  ?Net -1200 ml  ? ? ?Physical Exam: ?Obese male ?Pt I NAD  ?Lungs CTA ?Distant heart sounds ?Dressings on both groins ?No significant LE edema   ? ?Lab Results: ?Basic Metabolic Panel: ?Recent Labs  ?  12/05/21 ?1027  ?NA 139  ?K 4.2  ?CL 108  ?CO2 24  ?GLUCOSE 101*  ?BUN 24*  ?CREATININE 1.66*  ?CALCIUM 8.1*  ? ?Liver Function Tests: ?No results for input(s): AST, ALT, ALKPHOS, BILITOT, PROT, ALBUMIN in the last 72 hours. ?No results for input(s): LIPASE, AMYLASE in the last 72 hours. ?CBC: ?Recent Labs  ?  12/04/21 ?0633 12/05/21 ?1027  ?WBC 8.8 6.1  ?HGB 8.0* 8.1*  ?HCT 24.3* 26.7*  ?MCV 92.7 98.2  ?PLT 209 216  ? ?Cardiac Enzymes: ?No results for input(s): CKTOTAL, CKMB, CKMBINDEX, TROPONINI in the last 72 hours. ?BNP: ?Invalid input(s): POCBNP ?D-Dimer: ?No results for input(s): DDIMER in the last 72 hours. ?Hemoglobin A1C: ?No results for input(s): HGBA1C in the last 72 hours. ?Fasting Lipid Panel: ?No results for input(s): CHOL, HDL, LDLCALC, TRIG, CHOLHDL, LDLDIRECT in the last 72 hours. ?Thyroid Function Tests: ?No results for input(s): TSH, T4TOTAL, T3FREE, THYROIDAB in the last 72 hours. ? ?Invalid input(s): FREET3 ?Anemia Panel: ?No results for input(s): VITAMINB12, FOLATE, FERRITIN, TIBC, IRON, RETICCTPCT in the last 72 hours. ? ?Imaging: ?No results found. ? ?Cardiac Studies: ? ECG:  ?Orders placed or performed during the hospital encounter of 11/26/21   ? EKG 12-Lead  ? EKG 12-Lead  ? EKG 12-Lead  ? EKG 12-Lead  ? EKG 12-Lead  ? EKG 12-Lead  ? EKG 12-Lead  ? EKG 12-Lead  ? EKG 12-Lead  ? EKG 12-Lead  ? EKG 12-Lead  ? EKG 12-Lead  ?  ? Telemetry:  afib rates 80's  ? Echo: EF 55-60%  ? ?Medications: ?  ? allopurinol  300 mg Oral BH-q7a  ? amiodarone  200 mg Oral BID  ? aspirin EC  81 mg Oral BH-q7a  ? atenolol  25 mg Oral BH-q7a  ? clopidogrel  75 mg Oral Daily  ? docusate sodium  100 mg Oral Daily  ? ezetimibe  10 mg Oral Daily  ? heparin injection (subcutaneous)  5,000 Units Subcutaneous Q8H  ? multivitamin with minerals  1 tablet Oral Daily  ? pantoprazole (PROTONIX) IV  40 mg Intravenous Q12H  ? polyethylene glycol  17 g Oral Daily  ? Ensure Max Protein  11 oz Oral BID  ? silver sulfADIAZINE   Topical BID  ? simvastatin  20 mg Oral QPM  ? zolpidem  10 mg Oral QHS  ? ?  ? sodium chloride    ? sodium chloride Stopped (11/29/21 0456)  ? ? ?Assessment/Plan:  ? ?Afib: paroxysmal in setting of anemia requiring transfusion Hct 24.3 and  extensive PV procedure with stents needing DAT  ?He is on amio 200 bid   Woiuld continue for 2 wks the 200 per day   ?Outpt monitor to see if PAF recurs  Currently in SR with PACs ?  ?Favor DAT for 4 weeks then per PV consider plavix only and starting DOAC   His  CHADVASC score is  2  ?Consider taper of amio after when on DOAC ? ?HL:  continue statin and zetia ? ?Anemia:  stable Hct 26.7  Will need close outpt follow up ? ?Dorris Carnes ?12/06/2021, 8:26 AM ? ? ? ?

## 2021-12-06 NOTE — Progress Notes (Signed)
Occupational Therapy Treatment ?Patient Details ?Name: Kevin Gross ?MRN: [id number] ?DOB: [birth year] ?Today's Date: 12/06/2021 ? ? ?History of present illness Pt is a 66 y.o. male who presented 11/26/21 for bil femoral endarterectomy with patch angioplasty, bil iliac stenting, and bil infrarenal lithtripsy. Pt experienced extensive blood loss and was borught to the ICU intubated. ETT 3/31 - 4/2. PMH: alcoholic cirrhosis of liver, CHF, COPD, HLD, HTN, sleep apnea. ?  ?OT comments ? Patient treated from EOB due to patient states he is going home today and wanted to conserve energy for discharge. Energy conservation strategy handout reviewed with patient and education on AE for LB dressing. Patient was able to demonstrate use of dressing stick to doff sock and sock aide to donn. Patient asked to give 3 strategies from handout at end of treatment and was able to recall 3.  Patient is expected to discharge home with [place].   ? ?Recommendations for follow up therapy are one component of a multi-disciplinary discharge planning process, led by the attending physician.  Recommendations may be updated based on patient status, additional functional criteria and insurance authorization. ?   ?Follow Up Recommendations ? Home health OT  ?  ?Assistance Recommended at Discharge Intermittent Supervision/Assistance  ?Patient can return home with the following ? A little help with walking and/or transfers;A lot of help with bathing/dressing/bathroom;Assistance with cooking/housework;Assist for transportation;Help with stairs or ramp for entrance ?  ?Equipment Recommendations ? BSC/3in1;Other (comment) (wide)  ?  ?Recommendations for Other Services   ? ?  ?Precautions / Restrictions Precautions ?Precautions: Fall;Other (comment) ?Precaution Comments: scrotal edema ?Restrictions ?Weight Bearing Restrictions: No ?RLE Weight Bearing: Weight bearing as tolerated ?LLE Weight Bearing: Weight bearing as tolerated ?Other Position/Activity  Restrictions: scrotal sling  ? ? ?  ? ?Mobility Bed Mobility ?Overal bed mobility: Needs Assistance ?Bed Mobility: Supine to Sit, Sit to Supine, Rolling ?Rolling: Supervision ?  ?Supine to sit: Min guard, HOB elevated ?Sit to supine: Min guard ?  ?General bed mobility comments: used rail to assist, performed rolling in bed to change pad ?  ? ?Transfers ?Overall transfer level: Needs assistance ?  ?  ?  ?  ?  ?  ?  ?  ?General transfer comment: performed visit seated on EOB ?  ?  ?Balance Overall balance assessment: Needs assistance ?Sitting-balance support: No upper extremity supported, Feet supported ?Sitting balance-Leahy Scale: Good ?Sitting balance - Comments: LB dressing addressed from EOB without assistance for balance ?  ?  ?  ?  ?  ?  ?  ?  ?  ?  ?  ?  ?  ?  ?  ?   ? ?ADL either performed or assessed with clinical judgement  ? ?ADL Overall ADL's : Needs assistance/impaired ?  ?  ?  ?  ?  ?  ?  ?  ?  ?  ?Lower Body Dressing: Moderate assistance;With adaptive equipment ?Lower Body Dressing Details (indicate cue type and reason): education on reacher, dressing stick, and sock aide for LB dressing ?  ?  ?  ?  ?  ?  ?  ?General ADL Comments: AE training ?  ? ?Extremity/Trunk Assessment   ?  ?  ?  ?  ?  ? ?Vision   ?  ?  ?Perception   ?  ?Praxis   ?  ? ?Cognition Arousal/Alertness: Awake/alert ?Behavior During Therapy: [facility] for tasks assessed/performed ?Overall Cognitive Status: Within Functional Limits for tasks assessed ?  ?  ?  ?  ?  ?  ?  ?  ?  ?  ?  ?  ?  ?  ?  ?  ?  General Comments: followed commands, able to recall 3 energy conservation strategies following review ?  ?  ?   ?Exercises   ? ?  ?Shoulder Instructions   ? ? ?  ?General Comments reviewed handout for energy conservation strategies  ? ? ?Pertinent Vitals/ Pain       Pain Assessment ?Pain Assessment: Faces ?Faces Pain Scale: Hurts little more ?Pain Location: scrotum ?Pain Descriptors / Indicators: Grimacing, Discomfort ?Pain Intervention(s): Limited  activity within patient's tolerance, Monitored during session, Repositioned ? ?Home Living   ?  ?  ?  ?  ?  ?  ?  ?  ?  ?  ?  ?  ?  ?  ?  ?  ?  ?  ? ?  ?Prior Functioning/Environment    ?  ?  ?  ?   ? ?Frequency ? Min 2X/week  ? ? ? ? ?  ?Progress Toward Goals ? ?OT Goals(current goals can now be found in the care plan section) ? Progress towards OT goals: Progressing toward goals ? ?Acute Rehab OT Goals ?Patient Stated Goal: go home ?OT Goal Formulation: With patient ?Time For Goal Achievement: 12/13/21 ?Potential to Achieve Goals: Good ?ADL Goals ?Pt Will Perform Grooming: with supervision;standing ?Pt Will Perform Upper Body Dressing: with modified independence;sitting ?Pt Will Perform Lower Body Dressing: with supervision;with adaptive equipment;sit to/from stand ?Pt Will Transfer to Toilet: with supervision;ambulating ?Pt Will Perform Toileting - Clothing Manipulation and hygiene: with supervision;sit to/from stand;sitting/lateral leans;with adaptive equipment ?Additional ADL Goal #1: Pt will verbalize 3 energy conservation strategies for ADL with no cues ?Additional ADL Goal #2: Pt will perform bed mobility at mod I level prior to engaging in ADL  ?Plan Discharge plan remains appropriate   ? ?Co-evaluation ? ? ?   ?  ?  ?  ?  ? ?  ?AM-PAC OT "6 Clicks" Daily Activity     ?Outcome Measure ? ? Help from another person eating meals?: None ?Help from another person taking care of personal grooming?: A Little ?Help from another person toileting, which includes using toliet, bedpan, or urinal?: A Little ?Help from another person bathing (including washing, rinsing, drying)?: A Lot ?Help from another person to put on and taking off regular upper body clothing?: A Little ?Help from another person to put on and taking off regular lower body clothing?: A Lot ?6 Click Score: 17 ? ?  ?End of Session   ? ?OT Visit Diagnosis: Unsteadiness on feet (R26.81);Other abnormalities of gait and mobility (R26.89);Muscle weakness  (generalized) (M62.81);Pain ?Pain - Right/Left: Right ?Pain - part of body: Leg ?  ?Activity Tolerance Patient tolerated treatment well ?  ?Patient Left in bed;with call bell/phone within reach;with bed alarm set ?  ?Nurse Communication Mobility status ?  ? ?   ? ?Time: 3762-8315 ?OT Time Calculation (min): 23 min ? ?Charges: OT General Charges ?$OT Visit: 1 Visit ?OT Treatments ?$Self Care/Home Management : 23-37 mins ? ?Lodema Hong, OTA ?Acute Rehabilitation Services  ?Pager [phone] ?Office [phone] ? ? ?[place] ?12/06/2021, 1:01 PM ?

## 2021-12-07 ENCOUNTER — Ambulatory Visit: Payer: Self-pay

## 2021-12-07 ENCOUNTER — Telehealth: Payer: Self-pay | Admitting: *Deleted

## 2021-12-07 DIAGNOSIS — I4891 Unspecified atrial fibrillation: Secondary | ICD-10-CM

## 2021-12-07 NOTE — Telephone Encounter (Signed)
Patient contacted regarding discharge from [facility] on 12/06/21. ? ?Patient understands to follow up with provider Cadence Furth PA on 12/27/21 at 10:55 am at [facility]. ?Patient understands discharge instructions? Yes ?Patient understands medications and regiment? Yes ?Patient understands to bring all medications to this visit? Yes ? ?Patients wife states that they have not heard from the home nurse yet. They also were told he would have bandages to use and did not send them home with any. She reports that he also had some blood noted in his urine. Advised they should call PCP for the bloody urine and that I would forward to our provider for any recommendations. She verbalized understanding with no further questions at this time.  ?

## 2021-12-07 NOTE — Telephone Encounter (Signed)
Patient's daughter Kevin Gross called stating Dad was experiencing mild "watery bloody"drainage from incision and upon urinating this morning it was "very light pink" urine. ?I encouraged her to have patient up walking throughout the day and elevating legs when resting.  Daughter denied fever or redness of area.  I instructed daughter to call if worsening changes with the incision or blood in urine. ?Kevin Gross voiced understanding of the instructions. ?

## 2021-12-10 ENCOUNTER — Telehealth: Payer: Self-pay | Admitting: *Deleted

## 2021-12-10 NOTE — Telephone Encounter (Signed)
Nori Riis, OT with [facility] reported that upon visit patient expressed concern of bleeding from each side of scrotum, not at incision sites.  I called and spoke with patient. He states that since his surgery he has experienced this.  I instructed patient to ED and he states he has no transportation until his daughter arrives from work between 5 and 6. I suggested that he apply ice bags on each side of scrotum and to elevate with a rolled washcloth while elevating his legs above the level of his heart. I explained that he should go to the ED once his daughter arrives if he does not call 911.  I spoke to patient's wife as well explaining the importance.  Patient was sleeping when I later called to check on him. ? ? ?

## 2021-12-12 ENCOUNTER — Inpatient Hospital Stay (HOSPITAL_COMMUNITY)
Admission: EM | Admit: 2021-12-12 | Discharge: 2021-12-24 | DRG: 857 | Disposition: A | Discharge status: Home Health | Payer: Medicare PPO | Attending: Surgery | Admitting: Surgery

## 2021-12-12 ENCOUNTER — Encounter (HOSPITAL_COMMUNITY): Payer: Self-pay | Admitting: Emergency Medicine

## 2021-12-12 ENCOUNTER — Other Ambulatory Visit: Payer: Self-pay

## 2021-12-12 DIAGNOSIS — I480 Paroxysmal atrial fibrillation: Secondary | ICD-10-CM | POA: Diagnosis not present

## 2021-12-12 DIAGNOSIS — Z7982 Long term (current) use of aspirin: Secondary | ICD-10-CM

## 2021-12-12 DIAGNOSIS — Z7902 Long term (current) use of antithrombotics/antiplatelets: Secondary | ICD-10-CM

## 2021-12-12 DIAGNOSIS — K7031 Alcoholic cirrhosis of liver with ascites: Secondary | ICD-10-CM | POA: Diagnosis present

## 2021-12-12 DIAGNOSIS — I739 Peripheral vascular disease, unspecified: Secondary | ICD-10-CM | POA: Diagnosis present

## 2021-12-12 DIAGNOSIS — I70213 Atherosclerosis of native arteries of extremities with intermittent claudication, bilateral legs: Secondary | ICD-10-CM | POA: Diagnosis not present

## 2021-12-12 DIAGNOSIS — G4733 Obstructive sleep apnea (adult) (pediatric): Secondary | ICD-10-CM | POA: Diagnosis present

## 2021-12-12 DIAGNOSIS — N189 Chronic kidney disease, unspecified: Secondary | ICD-10-CM | POA: Diagnosis not present

## 2021-12-12 DIAGNOSIS — J449 Chronic obstructive pulmonary disease, unspecified: Secondary | ICD-10-CM | POA: Diagnosis present

## 2021-12-12 DIAGNOSIS — I5032 Chronic diastolic (congestive) heart failure: Secondary | ICD-10-CM | POA: Diagnosis present

## 2021-12-12 DIAGNOSIS — R7989 Other specified abnormal findings of blood chemistry: Secondary | ICD-10-CM | POA: Diagnosis present

## 2021-12-12 DIAGNOSIS — S31109A Unspecified open wound of abdominal wall, unspecified quadrant without penetration into peritoneal cavity, initial encounter: Secondary | ICD-10-CM | POA: Diagnosis not present

## 2021-12-12 DIAGNOSIS — T8189XA Other complications of procedures, not elsewhere classified, initial encounter: Secondary | ICD-10-CM | POA: Diagnosis present

## 2021-12-12 DIAGNOSIS — D638 Anemia in other chronic diseases classified elsewhere: Secondary | ICD-10-CM | POA: Diagnosis present

## 2021-12-12 DIAGNOSIS — I10 Essential (primary) hypertension: Secondary | ICD-10-CM | POA: Diagnosis present

## 2021-12-12 DIAGNOSIS — E782 Mixed hyperlipidemia: Secondary | ICD-10-CM | POA: Diagnosis present

## 2021-12-12 DIAGNOSIS — I959 Hypotension, unspecified: Secondary | ICD-10-CM | POA: Diagnosis not present

## 2021-12-12 DIAGNOSIS — D509 Iron deficiency anemia, unspecified: Secondary | ICD-10-CM | POA: Diagnosis present

## 2021-12-12 DIAGNOSIS — I482 Chronic atrial fibrillation, unspecified: Secondary | ICD-10-CM | POA: Diagnosis present

## 2021-12-12 DIAGNOSIS — T8130XA Disruption of wound, unspecified, initial encounter: Secondary | ICD-10-CM | POA: Diagnosis present

## 2021-12-12 DIAGNOSIS — Z6841 Body Mass Index (BMI) 40.0 and over, adult: Secondary | ICD-10-CM | POA: Diagnosis not present

## 2021-12-12 DIAGNOSIS — I130 Hypertensive heart and chronic kidney disease with heart failure and stage 1 through stage 4 chronic kidney disease, or unspecified chronic kidney disease: Secondary | ICD-10-CM | POA: Diagnosis present

## 2021-12-12 DIAGNOSIS — Y838 Other surgical procedures as the cause of abnormal reaction of the patient, or of later complication, without mention of misadventure at the time of the procedure: Secondary | ICD-10-CM | POA: Diagnosis present

## 2021-12-12 DIAGNOSIS — R7303 Prediabetes: Secondary | ICD-10-CM | POA: Diagnosis not present

## 2021-12-12 DIAGNOSIS — T8140XA Infection following a procedure, unspecified, initial encounter: Secondary | ICD-10-CM | POA: Diagnosis not present

## 2021-12-12 DIAGNOSIS — N179 Acute kidney failure, unspecified: Secondary | ICD-10-CM | POA: Diagnosis present

## 2021-12-12 DIAGNOSIS — D62 Acute posthemorrhagic anemia: Secondary | ICD-10-CM | POA: Diagnosis not present

## 2021-12-12 DIAGNOSIS — B961 Klebsiella pneumoniae [K. pneumoniae] as the cause of diseases classified elsewhere: Secondary | ICD-10-CM | POA: Diagnosis present

## 2021-12-12 DIAGNOSIS — T8142XA Infection following a procedure, deep incisional surgical site, initial encounter: Secondary | ICD-10-CM

## 2021-12-12 DIAGNOSIS — I898 Other specified noninfective disorders of lymphatic vessels and lymph nodes: Secondary | ICD-10-CM | POA: Diagnosis present

## 2021-12-12 DIAGNOSIS — N1832 Chronic kidney disease, stage 3b: Secondary | ICD-10-CM | POA: Diagnosis present

## 2021-12-12 DIAGNOSIS — I2510 Atherosclerotic heart disease of native coronary artery without angina pectoris: Secondary | ICD-10-CM | POA: Diagnosis not present

## 2021-12-12 DIAGNOSIS — K7030 Alcoholic cirrhosis of liver without ascites: Secondary | ICD-10-CM | POA: Diagnosis present

## 2021-12-12 DIAGNOSIS — Z87891 Personal history of nicotine dependence: Secondary | ICD-10-CM | POA: Diagnosis not present

## 2021-12-12 DIAGNOSIS — D649 Anemia, unspecified: Secondary | ICD-10-CM | POA: Diagnosis present

## 2021-12-12 DIAGNOSIS — I110 Hypertensive heart disease with heart failure: Secondary | ICD-10-CM | POA: Diagnosis not present

## 2021-12-12 DIAGNOSIS — D689 Coagulation defect, unspecified: Secondary | ICD-10-CM | POA: Diagnosis present

## 2021-12-12 DIAGNOSIS — T8142XD Infection following a procedure, deep incisional surgical site, subsequent encounter: Secondary | ICD-10-CM | POA: Diagnosis not present

## 2021-12-12 DIAGNOSIS — T8149XA Infection following a procedure, other surgical site, initial encounter: Principal | ICD-10-CM | POA: Diagnosis present

## 2021-12-12 DIAGNOSIS — Z8249 Family history of ischemic heart disease and other diseases of the circulatory system: Secondary | ICD-10-CM

## 2021-12-12 DIAGNOSIS — J432 Centrilobular emphysema: Secondary | ICD-10-CM | POA: Diagnosis not present

## 2021-12-12 DIAGNOSIS — I509 Heart failure, unspecified: Secondary | ICD-10-CM | POA: Diagnosis not present

## 2021-12-12 DIAGNOSIS — B9561 Methicillin susceptible Staphylococcus aureus infection as the cause of diseases classified elsewhere: Secondary | ICD-10-CM | POA: Diagnosis present

## 2021-12-12 DIAGNOSIS — Z79899 Other long term (current) drug therapy: Secondary | ICD-10-CM

## 2021-12-12 DIAGNOSIS — T8131XA Disruption of external operation (surgical) wound, not elsewhere classified, initial encounter: Secondary | ICD-10-CM | POA: Diagnosis present

## 2021-12-12 DIAGNOSIS — Z9989 Dependence on other enabling machines and devices: Secondary | ICD-10-CM | POA: Diagnosis not present

## 2021-12-12 DIAGNOSIS — E66813 Obesity, class 3: Secondary | ICD-10-CM | POA: Diagnosis present

## 2021-12-12 LAB — I-STAT CHEM 8, ED
BUN: 42 mg/dL — ABNORMAL HIGH (ref 8–23)
Calcium, Ion: 1.09 mmol/L — ABNORMAL LOW (ref 1.15–1.40)
Chloride: 107 mmol/L (ref 98–111)
Creatinine, Ser: 2.3 mg/dL — ABNORMAL HIGH (ref 0.61–1.24)
Glucose, Bld: 102 mg/dL — ABNORMAL HIGH (ref 70–99)
HCT: 27 % — ABNORMAL LOW (ref 39.0–52.0)
Hemoglobin: 9.2 g/dL — ABNORMAL LOW (ref 13.0–17.0)
Potassium: 4.3 mmol/L (ref 3.5–5.1)
Sodium: 137 mmol/L (ref 135–145)
TCO2: 22 mmol/L (ref 22–32)

## 2021-12-12 LAB — COMPREHENSIVE METABOLIC PANEL
ALT: 62 U/L — ABNORMAL HIGH (ref 0–44)
AST: 69 U/L — ABNORMAL HIGH (ref 15–41)
Albumin: 2.5 g/dL — ABNORMAL LOW (ref 3.5–5.0)
Alkaline Phosphatase: 57 U/L (ref 38–126)
Anion gap: 7 (ref 5–15)
BUN: 43 mg/dL — ABNORMAL HIGH (ref 8–23)
CO2: 22 mmol/L (ref 22–32)
Calcium: 8.5 mg/dL — ABNORMAL LOW (ref 8.9–10.3)
Chloride: 107 mmol/L (ref 98–111)
Creatinine, Ser: 2.2 mg/dL — ABNORMAL HIGH (ref 0.61–1.24)
GFR, Estimated: 33 mL/min — ABNORMAL LOW (ref >60)
Glucose, Bld: 107 mg/dL — ABNORMAL HIGH (ref 70–99)
Potassium: 4.3 mmol/L (ref 3.5–5.1)
Sodium: 136 mmol/L (ref 135–145)
Total Bilirubin: 1.2 mg/dL (ref 0.3–1.2)
Total Protein: 6.6 g/dL (ref 6.5–8.1)

## 2021-12-12 LAB — CBC WITH DIFFERENTIAL/PLATELET
Abs Immature Granulocytes: 0.03 10*3/uL (ref 0.00–0.07)
Basophils Absolute: 0 10*3/uL (ref 0.0–0.1)
Basophils Relative: 1 %
Eosinophils Absolute: 0.4 10*3/uL (ref 0.0–0.5)
Eosinophils Relative: 6 %
HCT: 28.8 % — ABNORMAL LOW (ref 39.0–52.0)
Hemoglobin: 8.6 g/dL — ABNORMAL LOW (ref 13.0–17.0)
Immature Granulocytes: 1 %
Lymphocytes Relative: 13 %
Lymphs Abs: 0.9 10*3/uL (ref 0.7–4.0)
MCH: 29.2 pg (ref 26.0–34.0)
MCHC: 29.9 g/dL — ABNORMAL LOW (ref 30.0–36.0)
MCV: 97.6 fL (ref 80.0–100.0)
Monocytes Absolute: 0.7 10*3/uL (ref 0.1–1.0)
Monocytes Relative: 11 %
Neutro Abs: 4.6 10*3/uL (ref 1.7–7.7)
Neutrophils Relative %: 68 %
Platelets: 496 10*3/uL — ABNORMAL HIGH (ref 150–400)
RBC: 2.95 MIL/uL — ABNORMAL LOW (ref 4.22–5.81)
RDW: 17.5 % — ABNORMAL HIGH (ref 11.5–15.5)
WBC: 6.7 10*3/uL (ref 4.0–10.5)
nRBC: 0 % (ref 0.0–0.2)

## 2021-12-12 MED ORDER — LABETALOL HCL 5 MG/ML IV SOLN: 10.0000 mg | INTRAVENOUS | Status: DC | PRN

## 2021-12-12 MED ORDER — ZOLPIDEM TARTRATE 5 MG PO TABS
10.0000 mg | ORAL_TABLET | Freq: Every day | ORAL | Status: DC
  Administered 2021-12-12 – 2021-12-23 (×12): 10 mg via ORAL
  Filled 2021-12-12 (×12): qty 2

## 2021-12-12 MED ORDER — VANCOMYCIN HCL 2000 MG/400ML IV SOLN
2000.0000 mg | Freq: Once | INTRAVENOUS | Status: AC
Start: 2021-12-12 — End: 2021-12-13
  Administered 2021-12-13: 2000 mg via INTRAVENOUS
  Filled 2021-12-12: qty 400

## 2021-12-12 MED ORDER — ALUM & MAG HYDROXIDE-SIMETH 200-200-20 MG/5ML PO SUSP: 15.0000 mL | ORAL | Status: DC | PRN

## 2021-12-12 MED ORDER — ASPIRIN EC 81 MG PO TBEC
81.0000 mg | DELAYED_RELEASE_TABLET | Freq: Every day | ORAL | Status: DC
  Administered 2021-12-13 – 2021-12-24 (×12): 81 mg via ORAL
  Filled 2021-12-12 (×12): qty 1

## 2021-12-12 MED ORDER — OXYCODONE-ACETAMINOPHEN 5-325 MG PO TABS
1.0000 | ORAL_TABLET | Freq: Four times a day (QID) | ORAL | Status: DC | PRN
  Administered 2021-12-13: 1 via ORAL
  Filled 2021-12-12: qty 1

## 2021-12-12 MED ORDER — KCL IN DEXTROSE-NACL 20-5-0.45 MEQ/L-%-% IV SOLN
INTRAVENOUS | Status: DC
  Filled 2021-12-12 (×2): qty 1000

## 2021-12-12 MED ORDER — TAMSULOSIN HCL 0.4 MG PO CAPS
0.4000 mg | ORAL_CAPSULE | Freq: Every day | ORAL | Status: DC
  Administered 2021-12-12 – 2021-12-23 (×12): 0.4 mg via ORAL
  Filled 2021-12-12 (×12): qty 1

## 2021-12-12 MED ORDER — ENOXAPARIN SODIUM 80 MG/0.8ML IJ SOSY
70.0000 mg | PREFILLED_SYRINGE | INTRAMUSCULAR | Status: DC
  Administered 2021-12-12 – 2021-12-22 (×11): 70 mg via SUBCUTANEOUS
  Filled 2021-12-12 (×4): qty 0.8
  Filled 2021-12-12: qty 0.7
  Filled 2021-12-12 (×6): qty 0.8

## 2021-12-12 MED ORDER — ALBUTEROL SULFATE (2.5 MG/3ML) 0.083% IN NEBU: 2.5000 mg | INHALATION_SOLUTION | Freq: Four times a day (QID) | RESPIRATORY_TRACT | Status: DC | PRN

## 2021-12-12 MED ORDER — ACETAMINOPHEN 325 MG PO TABS: 650.0000 mg | ORAL_TABLET | Freq: Four times a day (QID) | ORAL | Status: DC | PRN

## 2021-12-12 MED ORDER — ALBUTEROL SULFATE HFA 108 (90 BASE) MCG/ACT IN AERS: 1.0000 | INHALATION_SPRAY | Freq: Four times a day (QID) | RESPIRATORY_TRACT | Status: DC | PRN

## 2021-12-12 MED ORDER — VITAMIN D 25 MCG (1000 UNIT) PO TABS
2000.0000 [IU] | ORAL_TABLET | Freq: Every day | ORAL | Status: DC
  Administered 2021-12-13 – 2021-12-24 (×11): 2000 [IU] via ORAL
  Filled 2021-12-12 (×11): qty 2

## 2021-12-12 MED ORDER — FLUTICASONE PROPIONATE 50 MCG/ACT NA SUSP: 2.0000 | Freq: Every day | NASAL | Status: DC | PRN

## 2021-12-12 MED ORDER — CHLORTHALIDONE 25 MG PO TABS
25.0000 mg | ORAL_TABLET | Freq: Every day | ORAL | Status: DC
  Filled 2021-12-12: qty 1

## 2021-12-12 MED ORDER — EZETIMIBE 10 MG PO TABS
10.0000 mg | ORAL_TABLET | Freq: Every day | ORAL | Status: DC
  Administered 2021-12-13 – 2021-12-24 (×11): 10 mg via ORAL
  Filled 2021-12-12 (×12): qty 1

## 2021-12-12 MED ORDER — VANCOMYCIN HCL IN DEXTROSE 1-5 GM/200ML-% IV SOLN
1000.0000 mg | INTRAVENOUS | Status: DC
  Administered 2021-12-13 – 2021-12-16 (×4): 1000 mg via INTRAVENOUS
  Filled 2021-12-12 (×5): qty 200

## 2021-12-12 MED ORDER — ONDANSETRON HCL 4 MG/2ML IJ SOLN: 4.0000 mg | Freq: Four times a day (QID) | INTRAMUSCULAR | Status: DC | PRN

## 2021-12-12 MED ORDER — DOCUSATE SODIUM 100 MG PO CAPS
100.0000 mg | ORAL_CAPSULE | Freq: Every day | ORAL | Status: DC | PRN
  Administered 2021-12-15: 100 mg via ORAL
  Filled 2021-12-12: qty 1

## 2021-12-12 MED ORDER — ATENOLOL 25 MG PO TABS: 25.0000 mg | ORAL_TABLET | Freq: Every day | ORAL | Status: DC

## 2021-12-12 MED ORDER — SIMVASTATIN 20 MG PO TABS
20.0000 mg | ORAL_TABLET | Freq: Every evening | ORAL | Status: DC
  Administered 2021-12-12 – 2021-12-23 (×12): 20 mg via ORAL
  Filled 2021-12-12 (×12): qty 1

## 2021-12-12 MED ORDER — PIPERACILLIN-TAZOBACTAM 3.375 G IVPB
3.3750 g | Freq: Three times a day (TID) | INTRAVENOUS | Status: DC
Start: 2021-12-12 — End: 2021-12-17
  Administered 2021-12-13 – 2021-12-16 (×12): 3.375 g via INTRAVENOUS
  Filled 2021-12-12 (×12): qty 50

## 2021-12-12 MED ORDER — AMIODARONE HCL 200 MG PO TABS
200.0000 mg | ORAL_TABLET | Freq: Two times a day (BID) | ORAL | Status: DC
  Administered 2021-12-13 – 2021-12-14 (×2): 200 mg via ORAL
  Filled 2021-12-12 (×3): qty 1

## 2021-12-12 MED ORDER — CLOPIDOGREL BISULFATE 75 MG PO TABS
75.0000 mg | ORAL_TABLET | Freq: Every day | ORAL | Status: DC
  Administered 2021-12-13 – 2021-12-24 (×12): 75 mg via ORAL
  Filled 2021-12-12 (×12): qty 1

## 2021-12-12 MED ORDER — SODIUM CHLORIDE 0.9% FLUSH
3.0000 mL | Freq: Two times a day (BID) | INTRAVENOUS | Status: DC
  Administered 2021-12-13 – 2021-12-24 (×19): 3 mL via INTRAVENOUS

## 2021-12-12 MED ORDER — ALLOPURINOL 300 MG PO TABS
300.0000 mg | ORAL_TABLET | Freq: Every day | ORAL | Status: DC
  Administered 2021-12-13 – 2021-12-24 (×12): 300 mg via ORAL
  Filled 2021-12-12 (×2): qty 1
  Filled 2021-12-12: qty 3
  Filled 2021-12-12 (×9): qty 1

## 2021-12-12 MED ORDER — PHENOL 1.4 % MT LIQD: 1.0000 | OROMUCOSAL | Status: DC | PRN

## 2021-12-12 MED ORDER — GUAIFENESIN-DM 100-10 MG/5ML PO SYRP: 15.0000 mL | ORAL_SOLUTION | ORAL | Status: DC | PRN

## 2021-12-12 MED ORDER — METOPROLOL TARTRATE 5 MG/5ML IV SOLN: 2.0000 mg | INTRAVENOUS | Status: DC | PRN

## 2021-12-12 MED ORDER — LISINOPRIL 10 MG PO TABS
10.0000 mg | ORAL_TABLET | ORAL | Status: DC
  Filled 2021-12-12: qty 1

## 2021-12-12 MED ORDER — HYDRALAZINE HCL 20 MG/ML IJ SOLN: 5.0000 mg | INTRAMUSCULAR | Status: DC | PRN

## 2021-12-12 MED ORDER — PANTOPRAZOLE SODIUM 40 MG PO TBEC
40.0000 mg | DELAYED_RELEASE_TABLET | Freq: Every day | ORAL | Status: DC
  Administered 2021-12-13 – 2021-12-24 (×11): 40 mg via ORAL
  Filled 2021-12-12 (×11): qty 1

## 2021-12-12 MED ORDER — POTASSIUM CHLORIDE CRYS ER 20 MEQ PO TBCR: 20.0000 meq | EXTENDED_RELEASE_TABLET | Freq: Once | ORAL | Status: DC

## 2021-12-12 NOTE — H&P (Signed)
? ?ASSESSMENT & PLAN  ? ?INFECTED LYMPHOCELE LEFT GROIN: This patient will require admission for intravenous antibiotics.  In addition he will require exploration of the left groin for persistent lymphatic drainage.  Currently there is minimal drainage on the right side however I explained that if this appears to be increasing we would potentially explore the right groin also.  He will likely require placement of a VAC.  Given his elevated creatinine I did not obtain a CT to of his abdomen and pelvis.  I will start him on intravenous vancomycin and Zosyn pending his culture results.  ? ?REASON FOR ADMISSION:   ? ?Infected lymphocele left groin. ? ?HPI:  ? ?Kevin Gross is a 66 y.o. 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  His arteriogram showed extensive iliofemoral disease.  Because of his comorbidities he was not a candidate for aortofemoral bypass grafting.  He was taken to the operating room on 11/26/2021 and underwent: ?-Right iliofemoral endarterectomy with vein patch (he required placement of a Viabahn stent which extends into the proximal patch.  A VBX stent was placed above that) ?-Left iliofemoral endarterectomy with vein patch with stenting of the left common and external iliac artery. ? ?He states that at the time of discharge he did have a small amount of clear drainage from his incisions.  However since he has been home that drainage has steadily increased on the left side.  It was clear then became pink.  He denies fever or chills. ? ?He states that his legs feel much better since his procedure. ? ?Past Medical History:  ?Diagnosis Date  ? Alcoholic cirrhosis of liver ([place])   ? CHF (congestive heart failure) ([place])   ? Clotting disorder ([place])   ? Bilateral legs-pt states plaque build up in both legs requiring intervention  ? Complication of anesthesia   ? pt states that years ago during colonoscopy at [facility] they had to abort the procedure due to some  "cardiac issue" had to see cardiologist  ? COPD (chronic obstructive pulmonary disease) ([place])   ? Hyperlipidemia   ? Hypertension   ? Pre-diabetes   ? Sleep apnea   ? uses cpap  ? ? ?Family History  ?Problem Relation Age of Onset  ? Heart disease Mother   ? Heart disease Brother   ? ? ?SOCIAL HISTORY: ?Social History  ? ?Tobacco Use  ? Smoking status: Former  ?  Packs/day: 1.00  ?  Years: 47.00  ?  Pack years: 47.00  ?  Types: Cigarettes  ?  Quit date: 2016  ?  Years since quitting: 7.2  ? Smokeless tobacco: Never  ?Substance Use Topics  ? Alcohol use: Not Currently  ? ? ?No Known Allergies ? ?Current Facility-Administered Medications  ?Medication Dose Route Frequency Provider Last Rate Last Admin  ? sodium chloride flush (NS) 0.9 % injection 3 mL  3 mL Intravenous Q12H Wellington Hampshire, MD      ? ?Current Outpatient Medications  ?Medication Sig Dispense Refill  ? acetaminophen (TYLENOL) 325 MG tablet Take 2 tablets (650 mg total) by mouth every 6 (six) hours as needed for mild pain (or Fever >/= 101). 30 tablet 0  ? albuterol (PROVENTIL HFA;VENTOLIN HFA) 108 (90 Base) MCG/ACT inhaler Inhale 1-2 puffs into the lungs every 6 (six) hours as needed for wheezing or shortness of breath.    ? allopurinol (ZYLOPRIM) 300 MG tablet Take 300 mg by mouth every morning.    ?  amiodarone (PACERONE) 200 MG tablet Take 1 tablet (200 mg total) by mouth 2 (two) times daily. 30 tablet 2  ? aspirin EC 81 MG tablet Take 81 mg by mouth every morning.    ? atenolol (TENORMIN) 25 MG tablet Take 25 mg by mouth every morning.    ? chlorthalidone (HYGROTON) 25 MG tablet Take 25 mg by mouth every morning.    ? clopidogrel (PLAVIX) 75 MG tablet Take 1 tablet (75 mg total) by mouth daily. 30 tablet 2  ? Docusate Calcium (STOOL SOFTENER PO) Take 1 capsule by mouth daily as needed (constipation).    ? ezetimibe (ZETIA) 10 MG tablet Take 1 tablet (10 mg total) by mouth daily. 90 tablet 3  ? fluticasone (FLONASE) 50 MCG/ACT nasal spray Place 2  sprays into both nostrils daily as needed for allergies or rhinitis.    ? lisinopril (ZESTRIL) 10 MG tablet Take 10 mg by mouth every morning.    ? oxyCODONE-acetaminophen (PERCOCET/ROXICET) 5-325 MG tablet Take 1 tablet by mouth every 6 (six) hours as needed for moderate pain. 30 tablet 0  ? predniSONE (DELTASONE) 10 MG tablet Take 10-40 mg by mouth See admin instructions. Take as needed at the onset of a gout flare, take 40 mg on day 1, 30 mg on day 2, 20 mg on day 3, 10 mg on day 4, then stop    ? silver sulfADIAZINE (SILVADENE) 1 % cream Apply topically 2 (two) times daily. 85 g 0  ? simvastatin (ZOCOR) 20 MG tablet Take 20 mg by mouth every evening.    ? tamsulosin (FLOMAX) 0.4 MG CAPS capsule Take 0.4 mg by mouth daily after supper.    ? triamcinolone cream (KENALOG) 0.1 % Apply 1 application. topically daily as needed (irritation on foot).    ? VITAMIN D-1000 MAX ST 25 MCG (1000 UT) tablet Take 2,000 Units by mouth daily.    ? zolpidem (AMBIEN) 10 MG tablet Take 10 mg by mouth at bedtime.    ? ? ?REVIEW OF SYSTEMS:  ?'[X]'$  denotes positive finding, '[ ]'$  denotes negative finding ?Cardiac  Comments:  ?Chest pain or chest pressure:    ?Shortness of breath upon exertion:    ?Short of breath when lying flat:    ?Irregular heart rhythm:    ?    ?Vascular    ?Pain in calf, thigh, or hip brought on by ambulation:    ?Pain in feet at night that wakes you up from your sleep:     ?Blood clot in your veins:    ?Leg swelling:     ?    ?Pulmonary    ?Oxygen at home:    ?Productive cough:     ?Wheezing:     ?    ?Neurologic    ?Sudden weakness in arms or legs:     ?Sudden numbness in arms or legs:     ?Sudden onset of difficulty speaking or slurred speech:    ?Temporary loss of vision in one eye:     ?Problems with dizziness:     ?    ?Gastrointestinal    ?Blood in stool:     ?Vomited blood:     ?    ?Genitourinary    ?Burning when urinating:     ?Blood in urine:    ?    ?Psychiatric    ?Major depression:     ?    ?Hematologic     ?Bleeding problems:    ?Problems with blood  clotting too easily:    ?    ?Skin    ?Rashes or ulcers:    ?    ?Constitutional    ?Fever or chills:    ?- ? ?PHYSICAL EXAM:  ? ?Vitals:  ? 12/12/21 2045 12/12/21 2100 12/12/21 2115 12/12/21 2130  ?BP: (!) 107/47 117/64 (!) 98/55   ?Pulse: (!) 47 (!) 51 (!) 52 (!) 55  ?Resp: 19 (!) '22 16 15  '$ ?Temp:      ?TempSrc:      ?SpO2: 100% 100% 100% 100%  ? ?There is no height or weight on file to calculate BMI. ?GENERAL: The patient is an obese male, in no acute distress. The vital signs are documented above. ?CARDIAC: There is a regular rate and rhythm.  ?VASCULAR:  ?He has brisk dorsalis pedis and posterior tibial signals in both feet. ?He has bilateral lower extremity swelling. ?He has minimal drainage from the superior aspect of his right groin incision which is intact. ?He has moderate cloudy lymphatic drainage from the left groin. ?PULMONARY: There is good air exchange bilaterally without wheezing or rales. ?ABDOMEN: Soft and non-tender with normal pitched bowel sounds.  ?MUSCULOSKELETAL: There are no major deformities. ?NEUROLOGIC: No focal weakness or paresthesias are detected. ?SKIN: There are no ulcers or rashes noted. ?PSYCHIATRIC: The patient has a normal affect. ? ?DATA:   ? ?LABS: His GFR is 33.  Creatinine 2.3.  Hemoglobin is 9.2.  White blood cell count 6.7.  His glucose is 102. ? ?Deitra Mayo ?[facility] ? ?

## 2021-12-12 NOTE — ED Provider Triage Note (Signed)
Emergency Medicine Provider Triage Evaluation Note ? ?Kevin Gross , a 66 y.o. male  was evaluated in triage.  Pt complains of scrotal edema. He had femoral artery surgery on 11/26/21 with Dr. Trula Slade.  He was discharged 6 days ago.  He has had worsening bleeding, drainage and edema of his surgical sites since. Worst was last night.  ? ? ?Physical Exam  ?BP (!) 110/57 (BP Location: Right Arm)   Pulse 61   Temp 99.2 ?F (37.3 ?C) (Oral)   Resp 18   SpO2 100%  ?Gen:   Awake, no distress   ?Resp:  Normal effort  ?MSK:   Moves extremities without difficulty , he is able to stand and take a few steps around the wheel chair.  ?Other:  Patient is able to stand.  [place] chaperone male Therapist, sports. Patients scrotum is markedly enlarged.  Clear fluid drains from his inguinal areas when he stands with significant suprapubic edema.  His penis is surrounded by the enlarged scrotum, unable to visualize.   There is dried blood around the incisional sites and in the bilateral inguinal folds.  ? ?Medical Decision Making  ?Medically screening exam initiated at 4:40 PM.  Appropriate orders placed.  Kevin Gross was informed that the remainder of the evaluation will be completed by another provider, this initial triage assessment does not replace that evaluation, and the importance of remaining in the ED until their evaluation is complete. ? ? ?  ?Lorin Glass, [place] ?12/12/21 1643 ? ?

## 2021-12-12 NOTE — Progress Notes (Signed)
Pharmacy Antibiotic Note ? ?RAN Gross is a 66 y.o. male admitted on 12/12/2021 with L groin wound with persistent lymphatic drainage.  Pharmacy has been consulted for piperacillin/tazobactam and vancomycin dosing. ? ?ClCr 45 ml/min, Scr is trending up. ? ?4/16 Vancomycin '1000mg'$  Q 24 hr ?Scr used: 2.3 mg/dL ?Weight: 147.4 kg ?Vd coeff: 0.5 L/kg ?Est AUC: 445 ? ?Plan: ?Vancomycin 2g x1 then 1g q 24hr ?Piperacillin/tazobactam 3.'375mg'$  Q8 hr EI ?Monitor cultures, clinical status, renal function, vancomycin level ?Narrow abx as able and f/u duration  ? ? ?Temp (24hrs), Avg:98.5 ?F (36.9 ?C), Min:97.8 ?F (36.6 ?C), Max:99.2 ?F (37.3 ?C) ? ?Recent Labs  ?Lab 12/12/21 ?1702 12/12/21 ?1715  ?WBC 6.7  --   ?CREATININE 2.20* 2.30*  ?  ?Estimated Creatinine Clearance: 45.9 mL/min (A) (by C-G formula based on SCr of 2.3 mg/dL (H)).   ? ?No Known Allergies ? ?Antimicrobials this admission: ?piptazo 4/16 >>  ?vanc 4/16 >>  ? ?Dose adjustments this admission: ?N/a ? ?Microbiology results: ?4/16 wound cx: pend ?  ? ?Thank you for allowing pharmacy to be a part of this patient?s care. ?Kevin Gross, PharmD, BCPS, BCCP ?Clinical Pharmacist ? ?Please check AMION for all [place] phone numbers ?After 10:00 PM, call [place] [phone] ? ?

## 2021-12-12 NOTE — ED Provider Notes (Signed)
?[facility] ?Provider Note ? ? ?CSN: [id number] ?Arrival date & time: 12/12/21  1551 ? ?  ? ?History ? ?No chief complaint on file. ? ? ?Kevin Gross is a 66 y.o. male. ? ?Patient is a 66 year old male with a history of COPD, CHF, hypertension, hyperlipidemia, alcoholic cirrhosis, prediabetes, CKD, PAD status post bilateral femoral endarterectomy and bilateral iliac stents on 11/26/2021 who is presenting today due to worsening drainage from his surgical sites and severe swelling in his groin, scrotum and lower extremities.  Patient reports he is really noticed significant problems over the last for 5 days.  He has felt chilled intermittently but denies any known fever.  He has not had any nausea or vomiting.  Patient reports that the leg swelling in bilateral lower extremities and his scrotal area has gotten significantly worse in the last 4 to 5 days as well.  He has no shortness of breath or chest pain.  He denies any nausea vomiting or diarrhea.  He did call the vascular office 2 days ago but reports today is the first day he was able to get here.  Patient is on amiodarone but denies any diuretic therapy. ? ?The history is provided by the patient.  ? ?  ? ?Home Medications ?Prior to Admission medications   ?Medication Sig Start Date End Date Taking? Authorizing Provider  ?acetaminophen (TYLENOL) 325 MG tablet Take 2 tablets (650 mg total) by mouth every 6 (six) hours as needed for mild pain (or Fever >/= 101). 02/22/19  Yes Vaughan Basta, MD  ?albuterol (PROVENTIL HFA;VENTOLIN HFA) 108 (90 Base) MCG/ACT inhaler Inhale 1-2 puffs into the lungs every 6 (six) hours as needed for wheezing or shortness of breath.   Yes [provider]  ?allopurinol (ZYLOPRIM) 300 MG tablet Take 300 mg by mouth every morning. 12/29/20  Yes [provider]  ?amiodarone (PACERONE) 200 MG tablet Take 1 tablet (200 mg total) by mouth 2 (two) times daily. 12/06/21  Yes Baglia,  Corrina, PA-C  ?aspirin EC 81 MG tablet Take 81 mg by mouth every morning.   Yes [provider]  ?atenolol (TENORMIN) 25 MG tablet Take 25 mg by mouth every morning. 10/13/20  Yes [provider]  ?chlorthalidone (HYGROTON) 25 MG tablet Take 25 mg by mouth every morning. 12/29/20  Yes [provider]  ?clopidogrel (PLAVIX) 75 MG tablet Take 1 tablet (75 mg total) by mouth daily. 12/06/21  Yes Baglia, Corrina, PA-C  ?Docusate Calcium (STOOL SOFTENER PO) Take 1 capsule by mouth daily as needed (constipation).   Yes [provider]  ?ezetimibe (ZETIA) 10 MG tablet Take 1 tablet (10 mg total) by mouth daily. 04/10/18  Yes Minna Merritts, MD  ?fluticasone (FLONASE) 50 MCG/ACT nasal spray Place 2 sprays into both nostrils daily as needed for allergies or rhinitis.   Yes [provider]  ?lisinopril (ZESTRIL) 10 MG tablet Take 10 mg by mouth every morning. 12/29/20  Yes [provider]  ?oxyCODONE-acetaminophen (PERCOCET/ROXICET) 5-325 MG tablet Take 1 tablet by mouth every 6 (six) hours as needed for moderate pain. 12/06/21  Yes Baglia, Corrina, PA-C  ?predniSONE (DELTASONE) 10 MG tablet Take 10-40 mg by mouth See admin instructions. Take as needed at the onset of a gout flare, take 40 mg on day 1, 30 mg on day 2, 20 mg on day 3, 10 mg on day 4, then stop   Yes [provider]  ?silver sulfADIAZINE (SILVADENE) 1 % cream Apply  topically 2 (two) times daily. 12/06/21  Yes Baglia, Corrina, PA-C  ?simvastatin (ZOCOR) 20 MG tablet Take 20 mg by mouth every evening. 12/29/20  Yes [provider]  ?tamsulosin (FLOMAX) 0.4 MG CAPS capsule Take 0.4 mg by mouth daily after supper.   Yes [provider]  ?triamcinolone cream (KENALOG) 0.1 % Apply 1 application. topically daily as needed (irritation on foot).   Yes [provider]  ?VITAMIN D-1000 MAX ST 25 MCG (1000 UT) tablet Take 2,000 Units by mouth daily. 12/29/20  Yes [provider]   ?zolpidem (AMBIEN) 10 MG tablet Take 10 mg by mouth at bedtime. 12/29/20  Yes [provider]  ?   ? ?Allergies    ?Patient has no known allergies.   ? ?Review of Systems   ?Review of Systems ? ?Physical Exam ?Updated Vital Signs ?BP (!) 98/55   Pulse (!) 55   Temp 98.5 ?F (36.9 ?C) (Oral)   Resp 15   SpO2 100%  ?Physical Exam ?Vitals and nursing note reviewed.  ?Constitutional:   ?   General: He is not in acute distress. ?   Appearance: He is well-developed.  ?HENT:  ?   Head: Normocephalic and atraumatic.  ?   Mouth/Throat:  ?   Mouth: Mucous membranes are moist.  ?Eyes:  ?   Conjunctiva/sclera: Conjunctivae normal.  ?   Pupils: Pupils are equal, round, and reactive to light.  ?Cardiovascular:  ?   Rate and Rhythm: Regular rhythm. Bradycardia present.  ?   Heart sounds: No murmur heard. ?Pulmonary:  ?   Effort: Pulmonary effort is normal. No respiratory distress.  ?   Breath sounds: Normal breath sounds. No wheezing or rales.  ?Abdominal:  ?   General: There is no distension.  ?   Palpations: Abdomen is soft.  ?   Tenderness: There is no abdominal tenderness. There is no guarding or rebound.  ?Genitourinary: ? ? ?   Comments: Diffuse swelling in the groin involving the scrotum and pelvic region. ?Musculoskeletal:     ?   General: Tenderness present. Normal range of motion.  ?   Cervical back: Normal range of motion and neck supple.  ?   Right lower leg: Edema present.  ?   Left lower leg: Edema present.  ?   Comments: 3-4+ pitting edema in bilateral lower extremities beyond the knee up into the thigh.  1+ pulse palpated in bilateral feet.  Bilateral feet are warm  ?Skin: ?   General: Skin is warm and dry.  ?   Findings: No erythema or rash.  ?Neurological:  ?   Mental Status: He is alert and oriented to person, place, and time.  ?Psychiatric:     ?   Behavior: Behavior normal.  ? ? ?ED Results / Procedures / Treatments   ?Labs ?(all labs ordered are listed, but only abnormal results are  displayed) ?Labs Reviewed  ?COMPREHENSIVE METABOLIC PANEL - Abnormal; Notable for the following components:  ?    Result Value  ? Glucose, Bld 107 (*)   ? BUN 43 (*)   ? Creatinine, Ser 2.20 (*)   ? Calcium 8.5 (*)   ? Albumin 2.5 (*)   ? AST 69 (*)   ? ALT 62 (*)   ? GFR, Estimated 33 (*)   ? All other components within normal limits  ?CBC WITH DIFFERENTIAL/PLATELET - Abnormal; Notable for the following components:  ? RBC 2.95 (*)   ? Hemoglobin 8.6 (*)   ?  HCT 28.8 (*)   ? MCHC 29.9 (*)   ? RDW 17.5 (*)   ? Platelets 496 (*)   ? All other components within normal limits  ?I-STAT CHEM 8, ED - Abnormal; Notable for the following components:  ? BUN 42 (*)   ? Creatinine, Ser 2.30 (*)   ? Glucose, Bld 102 (*)   ? Calcium, Ion 1.09 (*)   ? Hemoglobin 9.2 (*)   ? HCT 27.0 (*)   ? All other components within normal limits  ? ? ?EKG ?None ? ?Radiology ?No results found. ? ?Procedures ?Procedures  ? ? ?Medications Ordered in ED ?Medications - No data to display ? ?ED Course/ Medical Decision Making/ A&P ?  ?                        ?Medical Decision Making ?Amount and/or Complexity of Data Reviewed ?External Data Reviewed: notes. ?   Details: Medical records from patient's recent endarterectomy were reviewed ?Labs: ordered. Decision-making details documented in ED Course. ? ?Risk ?Decision regarding hospitalization. ? ? ?Patient is a 66 year old male with multiple medical problems presenting today due to complications of a recent bilateral endarterectomy.  Patient has significant swelling and drainage from the left incision site.  He has reported feeling chilled but denies any fever.  Here patient's temperature is normal, pulses normal and blood pressure is soft but he is in no acute distress and is well-appearing.  Pulses intact in the lower extremities.  I have independently interpreted patient's labs today and his hemoglobin is stable at 8.6 since the surgery, white blood cell count is normal, CMP with worsening AKI  today of 2.20 from his normal range of 1.6-1.7.  Given patient's significant symptoms and being postop feel that he requires admission to the hospital.  Spoke with Dr. Doren Custard with vascular surgery who came and evaluated him and fe

## 2021-12-12 NOTE — Progress Notes (Signed)
ANTICOAGULATION CONSULT NOTE - Initial Consult ? ?Pharmacy Consult for heparin ?Indication: VTE prophylaxis ? ?No Known Allergies ? ?Patient Measurements: ?   ?147.4kg ?5'8" ? ?Vital Signs: ?Temp: 97.8 ?F (36.6 ?C) (04/16 2219) ?Temp Source: Oral (04/16 2219) ?BP: 108/89 (04/16 2200) ?Pulse Rate: 56 (04/16 2200) ? ?Labs: ?Recent Labs  ?  12/12/21 ?1702 12/12/21 ?1715  ?HGB 8.6* 9.2*  ?HCT 28.8* 27.0*  ?PLT 496*  --   ?CREATININE 2.20* 2.30*  ? ? ?Estimated Creatinine Clearance: 45.9 mL/min (A) (by C-G formula based on SCr of 2.3 mg/dL (H)). ? ? ?Medical History: ?Past Medical History:  ?Diagnosis Date  ? Alcoholic cirrhosis of liver ([place])   ? CHF (congestive heart failure) ([place])   ? Clotting disorder ([place])   ? Bilateral legs-pt states plaque build up in both legs requiring intervention  ? Complication of anesthesia   ? pt states that years ago during colonoscopy at [facility] they had to abort the procedure due to some "cardiac issue" had to see cardiologist  ? COPD (chronic obstructive pulmonary disease) ([place])   ? Hyperlipidemia   ? Hypertension   ? Pre-diabetes   ? Sleep apnea   ? uses cpap  ?  ?Assessment: ?66 yo M with persistent lymphatic drainage from L groin wound. Pharmacy consulted for heparin for DVT prophylaxis. Patient is obese (BMI 49.5) with AKI. Discussed with MD who agreed with switch to enoxaparin for fewer injections per day. ClCr 37m/min.  ? ?Goal of Therapy:   ?Monitor platelets by anticoagulation protocol: Yes ?  ?Plan:  ?Enoxaparin 0.'5mg'$ /kg/d = '70mg'$  q24 hr ?Monitor for signs/symptoms of bleeding  ? ?LBenetta Spar PharmD, BCPS, BCCP ?Clinical Pharmacist ? ?Please check AMION for all [place] numbers ?After 10:00 PM, call [phone]? ?

## 2021-12-12 NOTE — ED Notes (Signed)
EDP at bedside  

## 2021-12-12 NOTE — ED Triage Notes (Signed)
Pt states he had femoral artery surgery on 3/31.  Reports pain to bilateral groins, scrotal swelling, and bleeding/drainage from surgical site to R groin.   ?

## 2021-12-13 ENCOUNTER — Encounter (HOSPITAL_COMMUNITY): Payer: Self-pay | Admitting: Surgery

## 2021-12-13 ENCOUNTER — Inpatient Hospital Stay (HOSPITAL_COMMUNITY): Payer: Medicare PPO | Admitting: Certified Registered Nurse Anesthetist

## 2021-12-13 ENCOUNTER — Encounter (HOSPITAL_COMMUNITY)
Admission: EM | Disposition: A | Discharge status: Home Health | Payer: Self-pay | Source: Home / Self Care | Attending: Surgery

## 2021-12-13 ENCOUNTER — Other Ambulatory Visit: Payer: Self-pay

## 2021-12-13 ENCOUNTER — Other Ambulatory Visit (HOSPITAL_BASED_OUTPATIENT_CLINIC_OR_DEPARTMENT_OTHER): Payer: Self-pay

## 2021-12-13 DIAGNOSIS — I2510 Atherosclerotic heart disease of native coronary artery without angina pectoris: Secondary | ICD-10-CM

## 2021-12-13 DIAGNOSIS — T8189XA Other complications of procedures, not elsewhere classified, initial encounter: Secondary | ICD-10-CM

## 2021-12-13 DIAGNOSIS — G4733 Obstructive sleep apnea (adult) (pediatric): Secondary | ICD-10-CM | POA: Diagnosis present

## 2021-12-13 DIAGNOSIS — I898 Other specified noninfective disorders of lymphatic vessels and lymph nodes: Secondary | ICD-10-CM | POA: Diagnosis not present

## 2021-12-13 DIAGNOSIS — K7030 Alcoholic cirrhosis of liver without ascites: Secondary | ICD-10-CM | POA: Diagnosis present

## 2021-12-13 DIAGNOSIS — I10 Essential (primary) hypertension: Secondary | ICD-10-CM | POA: Diagnosis present

## 2021-12-13 DIAGNOSIS — I509 Heart failure, unspecified: Secondary | ICD-10-CM

## 2021-12-13 DIAGNOSIS — I110 Hypertensive heart disease with heart failure: Secondary | ICD-10-CM

## 2021-12-13 DIAGNOSIS — T8140XA Infection following a procedure, unspecified, initial encounter: Secondary | ICD-10-CM

## 2021-12-13 DIAGNOSIS — K7031 Alcoholic cirrhosis of liver with ascites: Secondary | ICD-10-CM | POA: Diagnosis present

## 2021-12-13 DIAGNOSIS — R7303 Prediabetes: Secondary | ICD-10-CM | POA: Diagnosis present

## 2021-12-13 DIAGNOSIS — Z87891 Personal history of nicotine dependence: Secondary | ICD-10-CM

## 2021-12-13 HISTORY — PX: APPLICATION OF WOUND VAC: SHX5189

## 2021-12-13 HISTORY — PX: INCISION AND DRAINAGE OF WOUND: SHX1803

## 2021-12-13 LAB — CBC
HCT: 27.3 % — ABNORMAL LOW (ref 39.0–52.0)
Hemoglobin: 8.1 g/dL — ABNORMAL LOW (ref 13.0–17.0)
MCH: 29 pg (ref 26.0–34.0)
MCHC: 29.7 g/dL — ABNORMAL LOW (ref 30.0–36.0)
MCV: 97.8 fL (ref 80.0–100.0)
Platelets: 458 10*3/uL — ABNORMAL HIGH (ref 150–400)
RBC: 2.79 MIL/uL — ABNORMAL LOW (ref 4.22–5.81)
RDW: 17.2 % — ABNORMAL HIGH (ref 11.5–15.5)
WBC: 6.5 10*3/uL (ref 4.0–10.5)
nRBC: 0 % (ref 0.0–0.2)

## 2021-12-13 LAB — TYPE AND SCREEN
ABO/RH(D): A POS
Antibody Screen: NEGATIVE

## 2021-12-13 LAB — URINALYSIS, ROUTINE W REFLEX MICROSCOPIC
Bilirubin Urine: NEGATIVE
Glucose, UA: NEGATIVE mg/dL
Hgb urine dipstick: NEGATIVE
Ketones, ur: NEGATIVE mg/dL
Leukocytes,Ua: NEGATIVE
Nitrite: NEGATIVE
Protein, ur: NEGATIVE mg/dL
Specific Gravity, Urine: 1.015 (ref 1.005–1.030)
pH: 5 (ref 5.0–8.0)

## 2021-12-13 LAB — COMPREHENSIVE METABOLIC PANEL
ALT: 53 U/L — ABNORMAL HIGH (ref 0–44)
AST: 52 U/L — ABNORMAL HIGH (ref 15–41)
Albumin: 2.5 g/dL — ABNORMAL LOW (ref 3.5–5.0)
Alkaline Phosphatase: 56 U/L (ref 38–126)
Anion gap: 10 (ref 5–15)
BUN: 48 mg/dL — ABNORMAL HIGH (ref 8–23)
CO2: 21 mmol/L — ABNORMAL LOW (ref 22–32)
Calcium: 8.5 mg/dL — ABNORMAL LOW (ref 8.9–10.3)
Chloride: 106 mmol/L (ref 98–111)
Creatinine, Ser: 2.3 mg/dL — ABNORMAL HIGH (ref 0.61–1.24)
GFR, Estimated: 31 mL/min — ABNORMAL LOW (ref >60)
Glucose, Bld: 99 mg/dL (ref 70–99)
Potassium: 4.4 mmol/L (ref 3.5–5.1)
Sodium: 137 mmol/L (ref 135–145)
Total Bilirubin: 0.8 mg/dL (ref 0.3–1.2)
Total Protein: 6.5 g/dL (ref 6.5–8.1)

## 2021-12-13 LAB — HIV ANTIBODY (ROUTINE TESTING W REFLEX): HIV Screen 4th Generation wRfx: NONREACTIVE

## 2021-12-13 LAB — PROTIME-INR
INR: 1.2 (ref 0.8–1.2)
Prothrombin Time: 15 seconds (ref 11.4–15.2)

## 2021-12-13 SURGERY — IRRIGATION AND DEBRIDEMENT WOUND
Anesthesia: General | Site: Groin | Laterality: Bilateral

## 2021-12-13 MED ORDER — LIDOCAINE 2% (20 MG/ML) 5 ML SYRINGE
INTRAMUSCULAR | Status: AC
  Filled 2021-12-13: qty 5

## 2021-12-13 MED ORDER — ONDANSETRON HCL 4 MG/2ML IJ SOLN
INTRAMUSCULAR | Status: DC | PRN
  Administered 2021-12-13: 4 mg via INTRAVENOUS

## 2021-12-13 MED ORDER — LIDOCAINE 2% (20 MG/ML) 5 ML SYRINGE
INTRAMUSCULAR | Status: DC | PRN
  Administered 2021-12-13: 80 mg via INTRAVENOUS

## 2021-12-13 MED ORDER — PROPOFOL 10 MG/ML IV BOLUS
INTRAVENOUS | Status: DC | PRN
  Administered 2021-12-13: 170 mg via INTRAVENOUS
  Administered 2021-12-13: 30 mg via INTRAVENOUS

## 2021-12-13 MED ORDER — ROCURONIUM BROMIDE 10 MG/ML (PF) SYRINGE
PREFILLED_SYRINGE | INTRAVENOUS | Status: DC | PRN
  Administered 2021-12-13: 30 mg via INTRAVENOUS
  Administered 2021-12-13: 20 mg via INTRAVENOUS

## 2021-12-13 MED ORDER — FENTANYL CITRATE (PF) 250 MCG/5ML IJ SOLN
INTRAMUSCULAR | Status: AC
  Filled 2021-12-13: qty 5

## 2021-12-13 MED ORDER — MIDAZOLAM HCL 2 MG/2ML IJ SOLN
INTRAMUSCULAR | Status: DC | PRN
  Administered 2021-12-13 (×2): 1 mg via INTRAVENOUS

## 2021-12-13 MED ORDER — PHENYLEPHRINE 40 MCG/ML (10ML) SYRINGE FOR IV PUSH (FOR BLOOD PRESSURE SUPPORT)
PREFILLED_SYRINGE | INTRAVENOUS | Status: AC
  Filled 2021-12-13: qty 20

## 2021-12-13 MED ORDER — EPHEDRINE 5 MG/ML INJ
INTRAVENOUS | Status: AC
  Filled 2021-12-13: qty 5

## 2021-12-13 MED ORDER — CHLORHEXIDINE GLUCONATE 0.12 % MT SOLN
15.0000 mL | Freq: Once | OROMUCOSAL | Status: AC
  Administered 2021-12-13: 15 mL via OROMUCOSAL

## 2021-12-13 MED ORDER — ROCURONIUM BROMIDE 10 MG/ML (PF) SYRINGE
PREFILLED_SYRINGE | INTRAVENOUS | Status: AC
  Filled 2021-12-13: qty 10

## 2021-12-13 MED ORDER — DEXAMETHASONE SODIUM PHOSPHATE 10 MG/ML IJ SOLN
INTRAMUSCULAR | Status: DC | PRN
  Administered 2021-12-13: 5 mg via INTRAVENOUS

## 2021-12-13 MED ORDER — LACTATED RINGERS IV SOLN: INTRAVENOUS | Status: DC

## 2021-12-13 MED ORDER — SODIUM CHLORIDE 0.9 % IV SOLN: INTRAVENOUS | Status: DC

## 2021-12-13 MED ORDER — MIDAZOLAM HCL 2 MG/2ML IJ SOLN
INTRAMUSCULAR | Status: AC
  Filled 2021-12-13: qty 2

## 2021-12-13 MED ORDER — DEXTROSE-NACL 5-0.45 % IV SOLN
INTRAVENOUS | Status: DC
Start: 2021-12-13 — End: 2021-12-14

## 2021-12-13 MED ORDER — PHENYLEPHRINE HCL (PRESSORS) 10 MG/ML IV SOLN
25.0000 ug/min | INTRAVENOUS | Status: DC
Start: 2021-12-13 — End: 2021-12-13

## 2021-12-13 MED ORDER — HEPARIN 6000 UNIT IRRIGATION SOLUTION
Status: AC
  Filled 2021-12-13: qty 500

## 2021-12-13 MED ORDER — CHLORHEXIDINE GLUCONATE 0.12 % MT SOLN
OROMUCOSAL | Status: AC
  Filled 2021-12-13: qty 15

## 2021-12-13 MED ORDER — VANCOMYCIN HCL 1000 MG IV SOLR
INTRAVENOUS | Status: AC
  Filled 2021-12-13: qty 20

## 2021-12-13 MED ORDER — PHENYLEPHRINE HCL-NACL 20-0.9 MG/250ML-% IV SOLN
INTRAVENOUS | Status: DC | PRN
  Administered 2021-12-13: 40 ug/min via INTRAVENOUS

## 2021-12-13 MED ORDER — FENTANYL CITRATE (PF) 250 MCG/5ML IJ SOLN
INTRAMUSCULAR | Status: DC | PRN
  Administered 2021-12-13 (×3): 50 ug via INTRAVENOUS

## 2021-12-13 MED ORDER — HYDRALAZINE HCL 20 MG/ML IJ SOLN
5.0000 mg | INTRAMUSCULAR | Status: DC | PRN
  Filled 2021-12-13: qty 0.25

## 2021-12-13 MED ORDER — CHLORHEXIDINE GLUCONATE 0.12 % MT SOLN: 15.0000 mL | Freq: Once | OROMUCOSAL | Status: DC

## 2021-12-13 MED ORDER — SUGAMMADEX SODIUM 200 MG/2ML IV SOLN
INTRAVENOUS | Status: DC | PRN
  Administered 2021-12-13 (×4): 100 mg via INTRAVENOUS

## 2021-12-13 MED ORDER — FENTANYL CITRATE (PF) 100 MCG/2ML IJ SOLN: 25.0000 ug | INTRAMUSCULAR | Status: DC | PRN

## 2021-12-13 MED ORDER — PHENYLEPHRINE 40 MCG/ML (10ML) SYRINGE FOR IV PUSH (FOR BLOOD PRESSURE SUPPORT)
PREFILLED_SYRINGE | INTRAVENOUS | Status: DC | PRN
  Administered 2021-12-13: 120 ug via INTRAVENOUS
  Administered 2021-12-13: 80 ug via INTRAVENOUS
  Administered 2021-12-13: 40 ug via INTRAVENOUS
  Administered 2021-12-13: 120 ug via INTRAVENOUS
  Administered 2021-12-13: 80 ug via INTRAVENOUS
  Administered 2021-12-13 (×2): 120 ug via INTRAVENOUS

## 2021-12-13 MED ORDER — 0.9 % SODIUM CHLORIDE (POUR BTL) OPTIME
TOPICAL | Status: DC | PRN
Start: 2021-12-13 — End: 2021-12-13
  Administered 2021-12-13: 1000 mL

## 2021-12-13 MED ORDER — EPHEDRINE SULFATE-NACL 50-0.9 MG/10ML-% IV SOSY
PREFILLED_SYRINGE | INTRAVENOUS | Status: DC | PRN
  Administered 2021-12-13: 5 mg via INTRAVENOUS
  Administered 2021-12-13 (×2): 10 mg via INTRAVENOUS

## 2021-12-13 MED ORDER — SUCCINYLCHOLINE CHLORIDE 200 MG/10ML IV SOSY
PREFILLED_SYRINGE | INTRAVENOUS | Status: DC | PRN
  Administered 2021-12-13: 140 mg via INTRAVENOUS

## 2021-12-13 MED ORDER — OXYCODONE-ACETAMINOPHEN 5-325 MG PO TABS
1.0000 | ORAL_TABLET | Freq: Four times a day (QID) | ORAL | Status: DC | PRN
  Administered 2021-12-13 – 2021-12-18 (×8): 2 via ORAL
  Administered 2021-12-18 (×2): 1 via ORAL
  Administered 2021-12-19 – 2021-12-23 (×7): 2 via ORAL
  Filled 2021-12-13 (×3): qty 2
  Filled 2021-12-13: qty 1
  Filled 2021-12-13 (×8): qty 2
  Filled 2021-12-13: qty 1
  Filled 2021-12-13 (×4): qty 2

## 2021-12-13 MED ORDER — ALBUMIN HUMAN 5 % IV SOLN: INTRAVENOUS | Status: DC | PRN

## 2021-12-13 MED ORDER — ORAL CARE MOUTH RINSE: 15.0000 mL | Freq: Once | OROMUCOSAL | Status: AC

## 2021-12-13 MED ORDER — MORPHINE SULFATE (PF) 2 MG/ML IV SOLN
2.0000 mg | INTRAVENOUS | Status: DC | PRN
  Administered 2021-12-13 – 2021-12-16 (×2): 2 mg via INTRAVENOUS
  Filled 2021-12-13 (×2): qty 1

## 2021-12-13 MED ORDER — ORAL CARE MOUTH RINSE: 15.0000 mL | Freq: Once | OROMUCOSAL | Status: DC

## 2021-12-13 MED ORDER — SODIUM CHLORIDE 0.9 % IV SOLN: 250.0000 mL | INTRAVENOUS | Status: DC

## 2021-12-13 SURGICAL SUPPLY — 38 items
ADH SKN CLS APL DERMABOND .7 (GAUZE/BANDAGES/DRESSINGS) ×2
APL SKNCLS STERI-STRIP NONHPOA (GAUZE/BANDAGES/DRESSINGS) ×10
BENZOIN TINCTURE PRP APPL 2/3 (GAUZE/BANDAGES/DRESSINGS) ×10 IMPLANT
CANISTER SUCT 3000ML PPV (MISCELLANEOUS) ×3 IMPLANT
CANISTER WOUNDNEG PRESSURE 500 (CANNISTER) ×2 IMPLANT
CONNECTOR Y ATS VAC SYSTEM (MISCELLANEOUS) ×2 IMPLANT
DERMABOND ADVANCED (GAUZE/BANDAGES/DRESSINGS) ×1
DERMABOND ADVANCED .7 DNX12 (GAUZE/BANDAGES/DRESSINGS) ×2 IMPLANT
DRESSING VERAFLO CLEANS CC MED (GAUZE/BANDAGES/DRESSINGS) ×1 IMPLANT
DRSG VAC ATS MED SENSATRAC (GAUZE/BANDAGES/DRESSINGS) ×4 IMPLANT
DRSG VERAFLO CLEANSE CC MED (GAUZE/BANDAGES/DRESSINGS) ×3
GLOVE BIO SURGEON STRL SZ 6.5 (GLOVE) ×4 IMPLANT
GLOVE BIO SURGEON STRL SZ7.5 (GLOVE) ×3 IMPLANT
GLOVE BIOGEL PI IND STRL 8 (GLOVE) ×2 IMPLANT
GLOVE BIOGEL PI INDICATOR 8 (GLOVE) ×1
GLOVE SRG 8 PF TXTR STRL LF DI (GLOVE) ×2 IMPLANT
GLOVE SURG POLYISO LF SZ8 (GLOVE) IMPLANT
GLOVE SURG UNDER POLY LF SZ8 (GLOVE) ×3
GOWN STRL REUS W/ TWL LRG LVL3 (GOWN DISPOSABLE) ×4 IMPLANT
GOWN STRL REUS W/TWL 2XL LVL3 (GOWN DISPOSABLE) ×3 IMPLANT
GOWN STRL REUS W/TWL LRG LVL3 (GOWN DISPOSABLE) ×6
KIT BASIN OR (CUSTOM PROCEDURE TRAY) ×3 IMPLANT
KIT STIMULAN RAPID CURE  10CC (Orthopedic Implant) ×3 IMPLANT
KIT STIMULAN RAPID CURE 10CC (Orthopedic Implant) ×1 IMPLANT
KIT TURNOVER KIT B (KITS) ×3 IMPLANT
NS IRRIG 1000ML POUR BTL (IV SOLUTION) ×3 IMPLANT
PACK PERIPHERAL VASCULAR (CUSTOM PROCEDURE TRAY) ×3 IMPLANT
PAD ARMBOARD 7.5X6 YLW CONV (MISCELLANEOUS) ×3 IMPLANT
PAD NEG PRESSURE SENSATRAC (MISCELLANEOUS) ×2 IMPLANT
SUT MNCRL AB 4-0 PS2 18 (SUTURE) ×1 IMPLANT
SUT PROLENE 5 0 C 1 24 (SUTURE) ×1 IMPLANT
SUT PROLENE 6 0 BV (SUTURE) ×1 IMPLANT
SUT VIC AB 2-0 CT1 27 (SUTURE)
SUT VIC AB 2-0 CT1 TAPERPNT 27 (SUTURE) ×1 IMPLANT
SUT VIC AB 3-0 SH 27 (SUTURE)
SUT VIC AB 3-0 SH 27X BRD (SUTURE) ×1 IMPLANT
TOWEL GREEN STERILE (TOWEL DISPOSABLE) ×3 IMPLANT
WATER STERILE IRR 1000ML POUR (IV SOLUTION) ×3 IMPLANT

## 2021-12-13 NOTE — Assessment & Plan Note (Addendum)
Infected lymphocele of bilateral groins. ?04/18 bilateral groin wound washout debridement, antibiotic bead placement and VAC placement. ?04/20 I&D of bilateral groin wounds including skin and subcutaneous tissue fat.  ?Placement of bilateral wound vacs.  ? ?Culture positive for klebsiella, enterococcus and staphylococcus (MSSA).  ?Patient continue to be afebrile, no leukocytosis with today wbc at 8,4  ? ?Groin pain is well controlled with analgesics ? ?Antibiotic therapy with IV cefazolin. ?Continue wound vac to right groin and wet to dry to the left groin.   ?Out of bed to chair tid with meals.  ?Further debridement tomorrow in the OR per primary team.  ? ? ?

## 2021-12-13 NOTE — Assessment & Plan Note (Addendum)
Patient off antihypertensive agents with controlled blood pressure systolic 704 to 888 mmHg.  ?Continue close monitoring.  ?

## 2021-12-13 NOTE — Assessment & Plan Note (Addendum)
Continue with aspirin and clopidogrel ?Patient sp revascularization  ?

## 2021-12-13 NOTE — Assessment & Plan Note (Signed)
-  Continue Zocor, Zetia ?

## 2021-12-13 NOTE — Progress Notes (Signed)
? ?  VASCULAR SURGERY ASSESSMENT & PLAN:  ? ?LYMPHOCELE LEFT GROIN: This patient presents with a draining lymphocele in the left groin and some erythema in both incisions.  He is scheduled for a washout of both incisions today and will likely require placement of the VAC on the left side. ? ?He has multiple medical issues including obstructive sleep apnea, coronary artery disease, congestive heart failure, chronic kidney disease, and PAF.  I have consulted Dr. Lorin Mercy for assistance with the management of his multiple medical problems.  It looks like his creatinine has gone up some and also he is having some issues with bradycardia.  I suspect his medications will have to be adjusted. ? ? ?SUBJECTIVE:  ? ?No complaints this morning. ? ?PHYSICAL EXAM:  ? ?Vitals:  ? 12/13/21 0800 12/13/21 0830 12/13/21 0900 12/13/21 0930  ?BP: 108/68 118/65 122/64 120/70  ?Pulse: (!) 52 (!) 59 72 (!) 49  ?Resp: '16 13 13 18  '$ ?Temp:      ?TempSrc:      ?SpO2: 100% 100% 100% 100%  ? ?Minimal drainage from the right groin. ?Moderate lymphatic drainage from the left groin. ? ?LABS:  ? ?Lab Results  ?Component Value Date  ? WBC 6.5 12/13/2021  ? HGB 8.1 (L) 12/13/2021  ? HCT 27.3 (L) 12/13/2021  ? MCV 97.8 12/13/2021  ? PLT 458 (H) 12/13/2021  ? ?Lab Results  ?Component Value Date  ? CREATININE 2.30 (H) 12/13/2021  ? ?Lab Results  ?Component Value Date  ? INR 1.2 12/13/2021  ? ?CBG (last 3)  ?No results for input(s): GLUCAP in the last 72 hours. ? ?PROBLEM LIST:   ? ?Principal Problem: ?  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  PAD (peripheral artery disease) ([place]) ? ? ?CURRENT MEDS:  ? ? allopurinol  300 mg Oral Daily  ? amiodarone  200 mg Oral BID  ? aspirin EC  81 mg Oral Daily  ? atenolol  25 mg Oral Daily  ? chlorthalidone  25 mg Oral Daily  ? cholecalciferol  2,000 Units Oral Daily  ? clopidogrel  75 mg Oral Daily  ? enoxaparin (LOVENOX) injection  70 mg Subcutaneous Q24H  ? ezetimibe  10 mg Oral Daily  ? lisinopril  10  mg Oral BH-q7a  ? pantoprazole  40 mg Oral Daily  ? simvastatin  20 mg Oral QPM  ? sodium chloride flush  3 mL Intravenous Q12H  ? sodium chloride flush  3 mL Intravenous Q12H  ? tamsulosin  0.4 mg Oral QPC supper  ? zolpidem  10 mg Oral QHS  ? ? ?Kevin Gross ?Office: [phone] ?12/13/2021 ? ?

## 2021-12-13 NOTE — Progress Notes (Signed)
? ?  Patient seen and examined in preop holding.  No complaints. ?No changes to medication history or physical exam since last seen in clinic. ?After discussing the risks and benefits of bilateral groin debridement and washout with abx beads and VAC placement, Kevin Gross elected to proceed.  ? ?Kevin John MD ? ? ? ? ?VASCULAR SURGERY ASSESSMENT & PLAN:  ? ?LYMPHOCELE LEFT GROIN: This patient presents with a draining lymphocele in the left groin and some erythema in both incisions.  He is scheduled for a washout of both incisions today and will likely require placement of the VAC on the left side. ? ?He has multiple medical issues including obstructive sleep apnea, coronary artery disease, congestive heart failure, chronic kidney disease, and PAF.  I have consulted Dr. Lorin Mercy for assistance with the management of his multiple medical problems.  It looks like his creatinine has gone up some and also he is having some issues with bradycardia.  I suspect his medications will have to be adjusted. ? ? ?SUBJECTIVE:  ? ?No complaints this morning. ? ?PHYSICAL EXAM:  ? ?Vitals:  ? 12/13/21 0900 12/13/21 0930 12/13/21 1000 12/13/21 1108  ?BP: 122/64 120/70 (!) 114/102 127/66  ?Pulse: 72 (!) 49 (!) 56 66  ?Resp: '13 18 17 17  '$ ?Temp:    97.6 ?F (36.4 ?C)  ?TempSrc:    Oral  ?SpO2: 100% 100% 100% 96%  ?Weight:    133.8 kg  ?Height:    '5\' 8"'$  (1.727 m)  ? ?Minimal drainage from the right groin. ?Moderate lymphatic drainage from the left groin. ? ?LABS:  ? ?Lab Results  ?Component Value Date  ? WBC 6.5 12/13/2021  ? HGB 8.1 (L) 12/13/2021  ? HCT 27.3 (L) 12/13/2021  ? MCV 97.8 12/13/2021  ? PLT 458 (H) 12/13/2021  ? ?Lab Results  ?Component Value Date  ? CREATININE 2.30 (H) 12/13/2021  ? ?Lab Results  ?Component Value Date  ? INR 1.2 12/13/2021  ? ?CBG (last 3)  ?No results for input(s): GLUCAP in the last 72 hours. ? ?PROBLEM LIST:   ? ?Principal Problem: ?  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  PAD  (peripheral artery disease) ([place]) ? ? ?CURRENT MEDS:  ? ? [MAR Hold] allopurinol  300 mg Oral Daily  ? [MAR Hold] amiodarone  200 mg Oral BID  ? [MAR Hold] aspirin EC  81 mg Oral Daily  ? [MAR Hold] atenolol  25 mg Oral Daily  ? [MAR Hold] chlorthalidone  25 mg Oral Daily  ? [MAR Hold] cholecalciferol  2,000 Units Oral Daily  ? [MAR Hold] clopidogrel  75 mg Oral Daily  ? [MAR Hold] enoxaparin (LOVENOX) injection  70 mg Subcutaneous Q24H  ? [MAR Hold] ezetimibe  10 mg Oral Daily  ? [MAR Hold] lisinopril  10 mg Oral BH-q7a  ? [MAR Hold] pantoprazole  40 mg Oral Daily  ? [MAR Hold] simvastatin  20 mg Oral QPM  ? [MAR Hold] sodium chloride flush  3 mL Intravenous Q12H  ? [MAR Hold] tamsulosin  0.4 mg Oral QPC supper  ? [MAR Hold] zolpidem  10 mg Oral QHS  ? ? ?Kevin Gross ?Office: [phone] ?12/13/2021 ? ?

## 2021-12-13 NOTE — Assessment & Plan Note (Addendum)
-  Body mass index is 44.85 kg/m?Marland Kitchen ? ?

## 2021-12-13 NOTE — Anesthesia Preprocedure Evaluation (Signed)
Anesthesia Evaluation  ?Patient identified by MRN, date of birth, ID band ?Patient awake ? ? ? ?Reviewed: ?Allergy & Precautions, NPO status , Patient's Chart, lab work & pertinent test results ? ?Airway ?Mallampati: III ? ?TM Distance: >3 FB ? ? ? ? Dental ? ?(+) Teeth Intact ?  ?Pulmonary ?sleep apnea and Continuous Positive Airway Pressure Ventilation , COPD, former smoker,  ?  ? ?+ decreased breath sounds ? ? ? ? ? Cardiovascular ?hypertension, Pt. on medications and Pt. on home beta blockers ?+ CAD, + Peripheral Vascular Disease and +CHF  ? ?Rhythm:Regular Rate:Normal ? ? ?  ?Neuro/Psych ?negative neurological ROS ? negative psych ROS  ? GI/Hepatic ?negative GI ROS, Neg liver ROS,   ?Endo/Other  ?negative endocrine ROS ? Renal/GU ?  ?negative genitourinary ?  ?Musculoskeletal ?Infected left groin  ? Abdominal ?(+) + obese,   ?Peds ? Hematology ?negative hematology ROS ?(+)   ?Anesthesia Other Findings ? ? Reproductive/Obstetrics ? ?  ? ? ? ? ? ? ? ? ? ? ? ? ? ?  ?  ? ? ? ? ? ? ? ?Anesthesia Physical ?Anesthesia Plan ? ?ASA: 3 ? ?Anesthesia Plan: General  ? ?Post-op Pain Management:   ? ?Induction: Intravenous ? ?PONV Risk Score and Plan: 2 and Ondansetron, Dexamethasone, Midazolam and Treatment may vary due to age or medical condition ? ?Airway Management Planned: Mask and Oral ETT ? ?Additional Equipment: None ? ?Intra-op Plan:  ? ?Post-operative Plan: Extubation in OR ? ?Informed Consent: I have reviewed the patients History and Physical, chart, labs and discussed the procedure including the risks, benefits and alternatives for the proposed anesthesia with the patient or authorized representative who has indicated his/her understanding and acceptance.  ? ? ? ?Dental advisory given ? ?Plan Discussed with:  ? ?Anesthesia Plan Comments: (Lab Results ?     Component                Value               Date                 ?     WBC                      6.5                  12/13/2021           ?     HGB                      8.1 (L)             12/13/2021           ?     HCT                      27.3 (L)            12/13/2021           ?     MCV                      97.8                12/13/2021           ?     PLT  458 (H)             12/13/2021           ?Lab Results ?     Component                Value               Date                 ?     NA                       137                 12/13/2021           ?     K                        4.4                 12/13/2021           ?     CO2                      21 (L)              12/13/2021           ?     GLUCOSE                  99                  12/13/2021           ?     BUN                      48 (H)              12/13/2021           ?     CREATININE               2.30 (H)            12/13/2021           ?     CALCIUM                  8.5 (L)             12/13/2021           ?     EGFR                     46 (L)              10/12/2021           ?     GFRNONAA                 31 (L)              12/13/2021           ?ECHO 03/23 ??1. Left ventricular ejection fraction, by estimation, is 55 to 60%. The  ?left ventricle has normal function. The left ventricle has no regional  ?wall motion abnormalities. There is mild left ventricular hypertrophy.  ?Left ventricular diastolic parameters  ?are indeterminate.  ??2. Right ventricular systolic function is normal. The right ventricular  ?size  is mildly enlarged.  ??3. The mitral valve is normal in structure. No evidence of mitral valve  ?regurgitation.  ??4. The aortic valve was not well visualized. Aortic valve regurgitation  ?is not visualized.  ??5. Aortic dilatation noted. 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  ??6. The inferior vena cava is normal in size with greater than 50%  ?respiratory variability, suggesting right atrial pressure of 3 mmHg. )  ? ? ? ? ? ?Anesthesia Quick Evaluation ? ?

## 2021-12-13 NOTE — Anesthesia Procedure Notes (Signed)
Procedure Name: Intubation ?Date/Time: 12/13/2021 12:06 PM ?Performed by: Janene Harvey, CRNA ?Pre-anesthesia Checklist: Patient identified, Emergency Drugs available, Suction available and Patient being monitored ?Patient Re-evaluated:Patient Re-evaluated prior to induction ?Oxygen Delivery Method: Circle system utilized ?Preoxygenation: Pre-oxygenation with 100% oxygen ?Induction Type: IV induction and Rapid sequence ?Laryngoscope Size: Glidescope and 3 ?Grade View: Grade I ?Tube type: Oral ?Tube size: 7.5 mm ?Number of attempts: 1 ?Airway Equipment and Method: Stylet and Oral airway ?Placement Confirmation: ETT inserted through vocal cords under direct vision, positive ETCO2 and breath sounds checked- equal and bilateral ?Secured at: 23 cm ?Tube secured with: Tape ?Dental Injury: Teeth and Oropharynx as per pre-operative assessment  ?Difficulty Due To: Difficulty was anticipated and Difficult Airway- due to dentition ?Comments: Elective glidescope. Poor dentition. Very loose tooth center/top ? ? ? ? ?

## 2021-12-13 NOTE — Assessment & Plan Note (Addendum)
Old records personally reviewed cardiology evaluation from 12/04/21. ?Patient with postoperative atrial fibrillation, complicated with acute blood loss anemia and requiring PRBC transfusion.  ?Recommendations to continue rate control with amiodarone and no anticoagulation for now.  ?Continue with aspirin and clopidogrel for ilac stents, ?Plan for possible post hospitalization 30 day monitor to consider anticoagulation if significant paroxysmal atrial fibrillation.  ? ? ?

## 2021-12-13 NOTE — Assessment & Plan Note (Signed)
Continue CPAP.  

## 2021-12-13 NOTE — Progress Notes (Signed)
Patient arrived to 4E from PACU. Groin sites assessed. Wound Vac in place. Patient vitals stable. Tele placed and CCMD notified. Patient oriented to unit. Call bell within reach.  ?Kevin Gross  ?

## 2021-12-13 NOTE — Assessment & Plan Note (Addendum)
A1c was 5.6  ?Continue glucose cover and monitoring with insulin sliding scale.  ?Fasting glucose this am 129 mg/dl  ? ?Dyslipidemia, continue with statin therapy.  ?

## 2021-12-13 NOTE — ED Notes (Signed)
Dr. Dickson at bedside.

## 2021-12-13 NOTE — Consult Note (Signed)
Initial Consultation Note ? ? ?Patient: Kevin Gross PJK:[id number] DOB: 08-22-1956 PCP: Rutherford Limerick, PA ?DOA: 12/12/2021 ?DOS: the patient was seen and examined on 12/13/2021 ?Primary service: Serafina Mitchell, MD ? ?Referring physician: Scot Dock, vascular ?Reason for consult: Lymphocele L groin, for B washout today.  Admitted to vascular, having some bradycardia, creatinine worsening, needs medical consult. ? ? ? 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 ?Essential hypertension ?-Hold current BP meds due to other issues (Chlorthalidone and Lisinopril for AKI, Atenolol due to bradycardia) ?-Will add prn IV hydralazine ? ?OSA (obstructive sleep apnea) ?-Continue CPAP ? ?Pre-diabetes ?-Recent A1c was 5.6  ?-Not on home meds ?-Will follow with fasting labs for now ? ? ?Alcoholic cirrhosis of liver without ascites ([place]) ?-Mild LFT elevation ?-Reports he is not drinking alcohol ?-Outpatient management appears to be reasonable ? ?PAF (paroxysmal atrial fibrillation) ([place]) ?-Rate controlled with Amiodarone ?-Hold Atenolol due to bradycardia ?-No AC for now given surgery, will defer to vascular ? ?PAD (peripheral artery disease) ([place]) ?-Recent bifemoral procedure ?-Vascular surgery is managing ?-Continue ASA and Plavix ? ?Morbid obesity ([place]) ?-Body mass index is 44.85 kg/m?. ?-Weight loss should be encouraged ?-Outpatient PCP/bariatric medicine f/u encouraged ? ?Mixed hyperlipidemia ?-Continue Zocor, Zetia ? ?Chronic kidney disease, stage 3b ([place]) ?-Mild AKI in the setting of stage 3b CKD ?-Hold Chlorthalidone, Lisinopril ?-Continue to follow with daily BMP ? ?COPD (chronic obstructive  pulmonary disease) ([place]) ?-He does not appear to be taking medications for this issue currently ? ? ? ? ?TRH will continue to follow the patient. ? ?HPI: Kevin Gross is a 66 y.o. male with past medical history of alcoholic cirrhosis; COPD; chronic diastolic CHF; HLD; HTN; pre-DM; morbid obesity; afib; and OSA on CPAP presenting with wound infection following bifemoral endarterectomy on 3/31.  He reports drainage from L > R groin recently.  He has had some bleeding and edema, including scrotal edema.  No fever.  He is feeling better since arriving in the ER.   ? ? ? ?Review of Systems: As mentioned in the history of present illness. All other systems reviewed and are negative. ?Past Medical History:  ?Diagnosis Date  ? Alcoholic cirrhosis of liver ([place])   ? CHF (congestive heart failure) ([place])   ? Clotting disorder ([place])   ? Bilateral legs-pt states plaque build up in both legs requiring intervention  ? Complication of anesthesia   ? pt states that years ago during colonoscopy at [facility] they had to abort the procedure due to some "cardiac issue" had to see cardiologist  ? COPD (chronic obstructive pulmonary disease) ([place])   ? Hyperlipidemia   ? Hypertension   ? Pre-diabetes   ? Sleep apnea   ? uses cpap  ? ?Past Surgical History:  ?Procedure Laterality Date  ? ABDOMINAL AORTOGRAM W/LOWER EXTREMITY N/A 10/20/2021  ? Procedure: ABDOMINAL AORTOGRAM W/LOWER EXTREMITY;  Surgeon: [place] Hampshire, MD;  Location: [facility];  Service: Cardiovascular;  Laterality: N/A;  ? ANGIOPLASTY Left 11/26/2021  ? Procedure: LEFT INFRARENAL LITHOTRIPSY;  Surgeon: Serafina Mitchell, MD;  Location: [facility];  Service: Vascular;  Laterality: Left;  ? CARDIAC CATHETERIZATION    ? [place]  ? COLONOSCOPY    ?  COLONOSCOPY WITH PROPOFOL N/A 01/17/2018  ? Procedure: COLONOSCOPY WITH PROPOFOL;  Surgeon: Toledo, Benay Pike, MD;  Location: [facility];  Service: Gastroenterology;  Laterality: N/A;  ? ENDARTERECTOMY FEMORAL Bilateral 11/26/2021  ?  Procedure: BILATERAL FEMORAL ENDARTERECTOMY WITH VEIN PATCH ANGIOPLASTY;  Surgeon: Serafina Mitchell, MD;  [facility];  Service: Vascular;  Laterality: Bilateral;  ? ESOPHAGOGASTRODUODENOSCOPY (EGD) WITH PROPOFOL N/A 01/17/2018  ? Procedure: ESOPHAGOGASTRODUODENOSCOPY (EGD) WITH PROPOFOL;  Surgeon: Toledo, Benay Pike, MD;  Location: [facility];  Service: Gastroenterology;  Laterality: N/A;  ? INSERTION OF ILIAC STENT Bilateral 11/26/2021  ? Procedure: BILATERAL ILIAC STENTING USING 61mX80mm INNOVA STENT ON LEFT ILIAC AND 873m59mm, 15m30m9mm VBX AND 15mm26m5cm VIABAHN STENT ON RIGHT ILIAC;  Surgeon: BrabSerafina Mitchell;  [facility];  Laterality: Bilateral;  ? ORIF ANKLE FRACTURE Right   ? PROSTATE BIOPSY N/A 04/01/2021  ? Procedure: PROSTATE BIOPSY URONAV;  Surgeon: WolfRoyston Cowper;  Location: [facility];  Service: Urology;  Laterality: N/A;  ? VEIN HARVEST Bilateral 11/26/2021  ? Procedure: VEIN HARVEST OF BILATERAL SPAHENOUS VEINS;  Surgeon: BrabSerafina Mitchell;  [facility];  Service: Vascular;  Laterality: Bilateral;  ? ?Social History:  reports that he quit smoking about 7 years ago. His smoking use included cigarettes. He has a 47.00 pack-year smoking history. He has never used smokeless tobacco. He reports that he does not currently use alcohol. He reports that he does not use drugs. ? ?No Known Allergies ? ?Family History  ?Problem Relation Age of Onset  ? Heart disease Mother   ? Heart disease Brother   ? ? ?Prior to Admission medications   ?Medication Sig Start Date End Date Taking? Authorizing Provider  ?acetaminophen (TYLENOL) 325 MG tablet Take 2 tablets (650 mg total) by mouth every 6 (six) hours as needed for mild pain (or Fever >/= 101). 02/22/19  Yes VachVaughan Basta  ?albuterol (PROVENTIL HFA;VENTOLIN HFA) 108 (90 Base) MCG/ACT inhaler Inhale 1-2 puffs into the lungs every 6 (six) hours as needed for wheezing or shortness of breath.   Yes [provider]  ?allopurinol (ZYLOPRIM) 300 MG tablet Take 300 mg by mouth every morning. 12/29/20  Yes [provider]  ?amiodarone (PACERONE) 200 MG tablet Take 1 tablet (200 mg total) by mouth 2 (two) times daily. 12/06/21  Yes Baglia, Corrina, PA-C  ?aspirin EC 81 MG tablet Take 81 mg by mouth every morning.   Yes [provider]  ?atenolol (TENORMIN) 25 MG tablet Take 25 mg by mouth every morning. 10/13/20  Yes [provider]  ?chlorthalidone (HYGROTON) 25 MG tablet Take 25 mg by mouth every morning. 12/29/20  Yes [provider]  ?clopidogrel (PLAVIX) 75 MG tablet Take 1 tablet (75 mg total) by mouth daily. 12/06/21  Yes Baglia, Corrina, PA-C  ?Docusate Calcium (STOOL SOFTENER PO) Take 1 capsule by mouth daily as needed (constipation).   Yes [provider]  ?ezetimibe (ZETIA) 10 MG tablet Take 1 tablet (10 mg total) by mouth daily. 04/10/18  Yes GollMinna Merritts  ?fluticasone (FLONASE) 50 MCG/ACT nasal spray Place 2 sprays into both nostrils daily as needed for allergies or rhinitis.   Yes [provider]  ?lisinopril (ZESTRIL) 10 MG tablet Take 10 mg by mouth every morning. 12/29/20  Yes [provider]  ?oxyCODONE-acetaminophen (PERCOCET/ROXICET) 5-325 MG tablet Take 1 tablet by mouth every 6 (six) hours as needed for moderate pain. 12/06/21  Yes Baglia, Corrina, PA-C  ?  predniSONE (DELTASONE) 10 MG tablet Take 10-40 mg by mouth See admin instructions. Take as needed at the onset of a gout flare, take 40 mg on day 1, 30 mg on day 2, 20 mg on day 3, 10 mg on day 4, then stop   Yes [provider]  ?silver sulfADIAZINE (SILVADENE) 1 % cream Apply topically 2 (two) times daily. 12/06/21  Yes Baglia, Corrina, PA-C  ?simvastatin (ZOCOR) 20 MG tablet Take 20 mg by mouth every evening. 12/29/20  Yes [provider]  ?tamsulosin (FLOMAX) 0.4 MG CAPS capsule Take 0.4 mg by mouth daily after supper.   Yes [provider]  ?triamcinolone  cream (KENALOG) 0.1 % Apply 1 application. topically daily as needed (irritation on foot).   Yes [provider]  ?VITAMIN D-1000 MAX ST 25 MCG (1000 UT) tablet Take 2,000 Units by mouth daily. 12/29/20

## 2021-12-13 NOTE — Progress Notes (Signed)
Wound vac not working on right side. Paged MD. Orders to place dry packing to right groin.  ?Kevin Gross  ?

## 2021-12-13 NOTE — Transfer of Care (Signed)
Immediate Anesthesia Transfer of Care Note ? ?Patient: Kevin Gross ? ?Procedure(s) Performed: IRRIGATION AND DEBRIDEMENT OF BILATERAL GROIN WOUNDS (Bilateral: Groin) ?APPLICATION OF WOUND VAC (Bilateral: Groin) ? ?Patient Location: PACU ? ?Anesthesia Type:General ? ?Level of Consciousness: awake and patient cooperative ? ?Airway & Oxygen Therapy: Patient Spontanous Breathing and Patient connected to face mask oxygen ? ?Post-op Assessment: Report given to RN and Post -op Vital signs reviewed and stable ? ?Post vital signs: Reviewed and stable ? ?Last Vitals:  ?Vitals Value Taken Time  ?BP 99/47 12/13/21 1335  ?Temp    ?Pulse 75 12/13/21 1339  ?Resp 14 12/13/21 1339  ?SpO2 98 % 12/13/21 1339  ?Vitals shown include unvalidated device data. ? ?Last Pain:  ?Vitals:  ? 12/13/21 1108  ?TempSrc: Oral  ?PainSc:   ?   ? ?  ? ?Complications: No notable events documented. ?

## 2021-12-13 NOTE — Assessment & Plan Note (Addendum)
No acute exacerbation  

## 2021-12-13 NOTE — Assessment & Plan Note (Addendum)
Patient with no signs of hypervolemia.  ? ?Stable renal function with serum cr at 2,0 with K at 4,1 and serum bicarbonate at 23.  ?Can continue renal function monitoring every 48 hrs for now.  ?Avoid hypotension and nephrotoxic medications.   ? ? ?

## 2021-12-13 NOTE — Assessment & Plan Note (Addendum)
No clinical signs of withdrawal. ?Continue close neuro checks, out of bed to chair tid with meals.  ?Follow up with nutrition recommendations.  ?

## 2021-12-13 NOTE — ED Notes (Addendum)
MD Scot Dock notified of pt HR- Bradycardic as low as 40s - Per MD Scot Dock hold amiodarone - No further orders at this time  ?

## 2021-12-13 NOTE — Progress Notes (Signed)
In review of the chart. It has been noted that Neo was stopped in PACU at 1638. Fran Lowes, RN VAST ?

## 2021-12-14 ENCOUNTER — Encounter (HOSPITAL_COMMUNITY): Payer: Self-pay | Admitting: Vascular Surgery

## 2021-12-14 DIAGNOSIS — I898 Other specified noninfective disorders of lymphatic vessels and lymph nodes: Secondary | ICD-10-CM | POA: Diagnosis not present

## 2021-12-14 DIAGNOSIS — T8189XA Other complications of procedures, not elsewhere classified, initial encounter: Secondary | ICD-10-CM | POA: Diagnosis not present

## 2021-12-14 DIAGNOSIS — T8142XA Infection following a procedure, deep incisional surgical site, initial encounter: Secondary | ICD-10-CM

## 2021-12-14 DIAGNOSIS — I70213 Atherosclerosis of native arteries of extremities with intermittent claudication, bilateral legs: Secondary | ICD-10-CM

## 2021-12-14 LAB — CBC
HCT: 25.6 % — ABNORMAL LOW (ref 39.0–52.0)
Hemoglobin: 7.9 g/dL — ABNORMAL LOW (ref 13.0–17.0)
MCH: 29.6 pg (ref 26.0–34.0)
MCHC: 30.9 g/dL (ref 30.0–36.0)
MCV: 95.9 fL (ref 80.0–100.0)
Platelets: 407 10*3/uL — ABNORMAL HIGH (ref 150–400)
RBC: 2.67 MIL/uL — ABNORMAL LOW (ref 4.22–5.81)
RDW: 16.8 % — ABNORMAL HIGH (ref 11.5–15.5)
WBC: 4.6 10*3/uL (ref 4.0–10.5)
nRBC: 0 % (ref 0.0–0.2)

## 2021-12-14 LAB — BASIC METABOLIC PANEL
Anion gap: 8 (ref 5–15)
BUN: 36 mg/dL — ABNORMAL HIGH (ref 8–23)
CO2: 23 mmol/L (ref 22–32)
Calcium: 8.5 mg/dL — ABNORMAL LOW (ref 8.9–10.3)
Chloride: 106 mmol/L (ref 98–111)
Creatinine, Ser: 1.9 mg/dL — ABNORMAL HIGH (ref 0.61–1.24)
GFR, Estimated: 39 mL/min — ABNORMAL LOW (ref >60)
Glucose, Bld: 139 mg/dL — ABNORMAL HIGH (ref 70–99)
Potassium: 5.6 mmol/L — ABNORMAL HIGH (ref 3.5–5.1)
Sodium: 137 mmol/L (ref 135–145)

## 2021-12-14 LAB — GLUCOSE, CAPILLARY
Glucose-Capillary: 113 mg/dL — ABNORMAL HIGH (ref 70–99)
Glucose-Capillary: 119 mg/dL — ABNORMAL HIGH (ref 70–99)
Glucose-Capillary: 144 mg/dL — ABNORMAL HIGH (ref 70–99)

## 2021-12-14 MED ORDER — AMIODARONE HCL 200 MG PO TABS
200.0000 mg | ORAL_TABLET | Freq: Every day | ORAL | Status: DC
  Administered 2021-12-15 – 2021-12-24 (×10): 200 mg via ORAL
  Filled 2021-12-14 (×10): qty 1

## 2021-12-14 MED ORDER — SODIUM CHLORIDE 0.9 % IV SOLN
INTRAVENOUS | Status: DC
Start: 2021-12-14 — End: 2021-12-15

## 2021-12-14 MED ORDER — INSULIN ASPART 100 UNIT/ML IJ SOLN
0.0000 [IU] | Freq: Three times a day (TID) | INTRAMUSCULAR | Status: DC
  Administered 2021-12-22: 2 [IU] via SUBCUTANEOUS
  Administered 2021-12-22 – 2021-12-23 (×3): 1 [IU] via SUBCUTANEOUS

## 2021-12-14 NOTE — Progress Notes (Addendum)
?  Progress Note ? ? ? ?12/14/2021 ?7:07 AM ?1 Day Post-Op ? ?Subjective:  no complaints.  Says his feet feel fine. ? ?afebrile ?HR 40's-60's ?638'G-5364'W systolic ?803% RA ? ?Vitals:  ? 12/14/21 0000 12/14/21 0421  ?BP:  106/79  ?Pulse: 62 (!) 58  ?Resp: 10 16  ?Temp: 97.8 ?F (36.6 ?C) (!) 97.4 ?F (36.3 ?C)  ?SpO2: 100% 100%  ? ? ?Physical Exam: ?Cardiac:  regular ?Lungs:  non labored ?Incisions:  left groin with vac with good seal; right groin is clean with abx beads in place ? ? ?Extremities:  +doppler signals bilateral PT ?Abdomen:  soft NT ? ?CBC ?   ?Component Value Date/Time  ? WBC 4.6 12/14/2021 0303  ? RBC 2.67 (L) 12/14/2021 0303  ? HGB 7.9 (L) 12/14/2021 0303  ? HGB 13.3 10/12/2021 1314  ? HCT 25.6 (L) 12/14/2021 0303  ? HCT 39.4 10/12/2021 1314  ? PLT 407 (H) 12/14/2021 0303  ? PLT 175 10/12/2021 1314  ? MCV 95.9 12/14/2021 0303  ? MCV 97 10/12/2021 1314  ? MCH 29.6 12/14/2021 0303  ? MCHC 30.9 12/14/2021 0303  ? RDW 16.8 (H) 12/14/2021 0303  ? RDW 13.1 10/12/2021 1314  ? LYMPHSABS 0.9 12/12/2021 1702  ? LYMPHSABS 1.9 10/12/2021 1314  ? MONOABS 0.7 12/12/2021 1702  ? EOSABS 0.4 12/12/2021 1702  ? EOSABS 0.7 (H) 10/12/2021 1314  ? BASOSABS 0.0 12/12/2021 1702  ? BASOSABS 0.1 10/12/2021 1314  ? ? ?BMET ?   ?Component Value Date/Time  ? NA 137 12/14/2021 0303  ? NA 145 (H) 10/12/2021 1314  ? K 5.6 (H) 12/14/2021 0303  ? CL 106 12/14/2021 0303  ? CO2 23 12/14/2021 0303  ? GLUCOSE 139 (H) 12/14/2021 0303  ? BUN 36 (H) 12/14/2021 0303  ? BUN 37 (H) 10/12/2021 1314  ? CREATININE 1.90 (H) 12/14/2021 0303  ? CALCIUM 8.5 (L) 12/14/2021 0303  ? GFRNONAA 39 (L) 12/14/2021 0303  ? GFRAA 56 (L) 10/26/2019 0551  ? ? ?INR ?   ?Component Value Date/Time  ? INR 1.2 12/13/2021 0002  ? ? ? ?Intake/Output Summary (Last 24 hours) at 12/14/2021 0707 ?Last data filed at 12/13/2021 1328 ?Gross per 24 hour  ?Intake 500 ml  ?Output 50 ml  ?Net 450 ml  ? ?Component 1 d ago  ?Specimen Description WOUND LEFT GROIN   ?Special Requests  NONE   ?Gram Stain FEW WBC PRESENT, PREDOMINANTLY PMN  ?FEW GRAM POSITIVE COCCI IN CLUSTERS  ?RARE GRAM POSITIVE RODS  ?Performed at [facility], [place] [address] [place], [place] [address]   ?Culture PENDING   ?Report Status PENDING   ? ? ?Assessment/Plan:  66 y.o. male is s/p:  ?Bilateral groin wound washout debridement, antibiotic bead placement, VAC placement  ?1 Day Post-Op ? ? ?--pt right groin vac off yesterday due to clotting of tubing.  Dressing changed this morning and wound looks clean.  The "Y" connector tubing not available.  Wound vac will need to be replaced once tubing is available.  ?-few GPC in clusters and rare GPR on gram stain.  Continue broad spectrum abx ?-+doppler signals bilateral PT ?-DVT prophylaxis:  Lovenox '70mg'$  q24h ? ? ?[name], PA-C ?[facility] ?[phone] ?12/14/2021 ?7:07 AM ? ?Agree with the above.  Right groin vac replaced.  Continue vanco / zosyn ?Likely return to OR on Thursday for washout ?[place] Ayda Tancredi ? ?

## 2021-12-14 NOTE — Anesthesia Postprocedure Evaluation (Signed)
Anesthesia Post Note ? ?Patient: Kevin Gross ? ?Procedure(s) Performed: IRRIGATION AND DEBRIDEMENT OF BILATERAL GROIN WOUNDS (Bilateral: Groin) ?APPLICATION OF WOUND VAC (Bilateral: Groin) ? ?  ? ?Patient location during evaluation: PACU ?Anesthesia Type: General ?Level of consciousness: awake and alert ?Pain management: pain level controlled ?Vital Signs Assessment: post-procedure vital signs reviewed and stable ?Respiratory status: spontaneous breathing, nonlabored ventilation, respiratory function stable and patient connected to nasal cannula oxygen ?Cardiovascular status: blood pressure returned to baseline and stable ?Postop Assessment: no apparent nausea or vomiting ?Anesthetic complications: no ? ? ?No notable events documented. ? ?Last Vitals:  ?Vitals:  ? 12/14/21 0000 12/14/21 0421  ?BP:  106/79  ?Pulse: 62 (!) 58  ?Resp: 10 16  ?Temp: 36.6 ?C (!) 36.3 ?C  ?SpO2: 100% 100%  ?  ?Last Pain:  ?Vitals:  ? 12/14/21 0421  ?TempSrc: Oral  ?PainSc:   ? ? ?  ?  ?  ?  ?  ?  ? ?March Rummage Noah Pelaez ? ? ? ? ?

## 2021-12-14 NOTE — Plan of Care (Signed)
?  Problem: Education: ?Goal: Knowledge of General Education information will improve ?Description: Including pain rating scale, medication(s)/side effects and non-pharmacologic comfort measures ?Outcome: Progressing ?  ?Problem: Health Behavior/Discharge Planning: ?Goal: Ability to manage health-related needs will improve ?Outcome: Progressing ?  ?Problem: Clinical Measurements: ?Goal: Ability to maintain clinical measurements within normal limits will improve ?Outcome: Progressing ?Goal: Will remain free from infection ?Outcome: Progressing ?Goal: Diagnostic test results will improve ?Outcome: Progressing ?Goal: Respiratory complications will improve ?Outcome: Progressing ?Goal: Cardiovascular complication will be avoided ?Outcome: Progressing ?  ?Problem: Nutrition: ?Goal: Adequate nutrition will be maintained ?Outcome: Progressing ?  ?Problem: Coping: ?Goal: Level of anxiety will decrease ?Outcome: Progressing ?  ?Problem: Pain Managment: ?Goal: General experience of comfort will improve ?Outcome: Progressing ?  ?

## 2021-12-14 NOTE — Progress Notes (Signed)
Patient placed on cpap at this time

## 2021-12-14 NOTE — Progress Notes (Signed)
?  Transition of Care (TOC) Screening Note ? ? ?Patient Details  ?Name: Kevin Gross ?Date of Birth: Dec 18, 1955 ? ? ?Transition of Care (TOC) CM/SW Contact:    ?Dahlia Client, Romeo Rabon, RN ?Phone Number: ?12/14/2021, 2:48 PM ? ? ? ?Transition of Care Department [facility]) has reviewed patient and note patient s/p I&D with Bil. Wound VAC placement. Also note patient may return to OR later this week for further washout. We will continue to monitor patient advancement through interdisciplinary progression rounds-potential need for [facility] and home wound VAC. If new patient transition needs arise, please place a TOC consult. ?  ?

## 2021-12-14 NOTE — Consult Note (Signed)
? ?Kevin Gross  DJM:[id number] DOB: [birth year] DOA: 12/12/2021 ?PCP: Rutherford Limerick, PA   ? ?Brief Narrative:  ?66 year old with a history of alcoholic cirrhosis, COPD, chronic diastolic CHF, HLD, HTN, pre-DM, chronic atrial fibrillation, morbid obesity, and OSA on CPAP who presented to the hospital with a fluid draining from his L surgical wound following a bifemoral endarterectomy 3/31.  He was diagnosed with an infected left groin lymphocele and was admitted by the Vascular Surgery service.  ? ?Code Status: FULL CODE ? ?Interim Hx: ?Afebrile.  Some mild bradycardia with heart rate 46-70.  Blood pressure preserved.  Saturations 100% room air.  Creatinine has improved from 2.3-1.9.  Resting comfortably in bed at the time of my exam without acute complaints. ? ?Assessment & Plan: ? 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 ?Essential hypertension ?Blood pressure currently stable with usual home medications on hold ? ?OSA (obstructive sleep apnea) ?Continue CPAP ? ?Pre-diabetes ?Recent A1c was 5.6 - not on home meds - monitor CBG with healing wound ? ?Alcoholic cirrhosis of liver without ascites ?-Reports he is not drinking alcohol ?-Outpatient management appropriate ? ?PAF (paroxysmal atrial fibrillation)  ?-Rate controlled with Amiodarone -lower dose in setting of bradycardia ?-Holding Atenolol due to bradycardia ?-No AC for now given surgery ? ?PAD (peripheral artery disease)  ?-Recent bifemoral procedure ?-Vascular Surgery is managing ?-on ASA and Plavix ? ?Morbid obesity  ?-Body mass index is 44.85 kg/m?. ?-Weight loss encouraged ?-Outpatient PCP/bariatric medicine f/u encouraged ? ?Mixed hyperlipidemia ?Continue Zocor, Zetia ? ?Chronic kidney disease, stage  3b  ?-Mild AKI in the setting of stage 3b CKD ?-Hold Chlorthalidone, Lisinopril ?-Creatinine improved today - continue to follow trend ? ?COPD (chronic obstructive pulmonary disease) ?-He does not appear to be taking medications for this issue currently ? ? ?Family Communication: No family present at time of exam ? ?Objective: ?Blood pressure 111/73, pulse (!) 59, temperature (!) 97.5 ?F (36.4 ?C), temperature source Oral, resp. rate 17, height '5\' 8"'$  (1.727 m), weight 133.8 kg, SpO2 100 %. ? ?Intake/Output Summary (Last 24 hours) at 12/14/2021 0936 ?Last data filed at 12/14/2021 0700 ?Gross per 24 hour  ?Intake 500 ml  ?Output 1350 ml  ?Net -850 ml  ? ?Filed Weights  ? 12/13/21 1108  ?Weight: 133.8 kg  ? ? ?Examination: ?General: No acute respiratory distress ?Lungs: Clear to auscultation bilaterally without wheezes or crackles ?Cardiovascular: Regular rate without murmur  ?Abdomen: Nontender, nondistended, soft, bowel sounds positive ? ?CBC: ?Recent Labs  ?Lab 12/12/21 ?1702 12/12/21 ?1715 12/13/21 ?0002 12/14/21 ?0303  ?WBC 6.7  --  6.5 4.6  ?NEUTROABS 4.6  --   --   --   ?HGB 8.6* 9.2* 8.1* 7.9*  ?HCT 28.8* 27.0* 27.3* 25.6*  ?MCV 97.6  --  97.8 95.9  ?PLT 496*  --  458* 407*  ? ?Basic Metabolic Panel: ?Recent Labs  ?Lab 12/12/21 ?1702 12/12/21 ?1715 12/13/21 ?0002 12/14/21 ?0303  ?NA 136 137 137 137  ?K 4.3 4.3 4.4 5.6*  ?CL 107 107 106 106  ?CO2 22  --  21* 23  ?GLUCOSE 107* 102* 99 139*  ?BUN 43* 42* 48* 36*  ?CREATININE 2.20* 2.30* 2.30* 1.90*  ?CALCIUM 8.5*  --  8.5* 8.5*  ? ?GFR: ?Estimated Creatinine Clearance: 52.6 mL/min (A) (by C-G formula based  on SCr of 1.9 mg/dL (H)). ? ?Liver Function Tests: ?Recent Labs  ?Lab 12/12/21 ?1702 12/13/21 ?0002  ?AST 69* 52*  ?ALT 62* 53*  ?ALKPHOS 57 56  ?BILITOT 1.2 0.8  ?PROT 6.6 6.5  ?ALBUMIN 2.5* 2.5*  ? ? ?Coagulation Profile: ?Recent Labs  ?Lab 12/13/21 ?0002  ?INR 1.2  ? ? ?HbA1C: ?Hgb A1c MFr Bld  ?Date/Time Value Ref Range Status  ?11/27/2021 03:28 AM 5.6 4.8 -  5.6 % Final  ?  Comment:  ?  (NOTE) ?Pre diabetes:          5.7%-6.4% ? ?Diabetes:              >6.4% ? ?Glycemic control for   <7.0% ?adults with diabetes ?  ? ? ?Scheduled Meds: ? allopurinol  300 mg Oral Daily  ? amiodarone  200 mg Oral BID  ? aspirin EC  81 mg Oral Daily  ? cholecalciferol  2,000 Units Oral Daily  ? clopidogrel  75 mg Oral Daily  ? enoxaparin (LOVENOX) injection  70 mg Subcutaneous Q24H  ? ezetimibe  10 mg Oral Daily  ? pantoprazole  40 mg Oral Daily  ? simvastatin  20 mg Oral QPM  ? sodium chloride flush  3 mL Intravenous Q12H  ? tamsulosin  0.4 mg Oral QPC supper  ? zolpidem  10 mg Oral QHS  ? ?Continuous Infusions: ? dextrose 5 % and 0.45% NaCl 100 mL/hr at 12/13/21 1054  ? piperacillin-tazobactam (ZOSYN)  IV 3.375 g (12/14/21 0865)  ? vancomycin Stopped (12/13/21 2357)  ? ? ? LOS: 2 days  ? ?Cherene Altes, MD ?[facility] ?Office  [phone] ?Pager - Text Page per Shea Evans ? ?If 7PM-7AM, please contact night-coverage per Amion ?12/14/2021, 9:36 AM ? ? ?  ?

## 2021-12-14 NOTE — Op Note (Signed)
? ? ?  NAME: Kevin Gross    MRN: [id number] ?DOB: Sep 28, 1955    DATE OF OPERATION: 12/14/2021 ? ?PREOP DIAGNOSIS:   ? ?Bilateral postsurgical groin wounds ? ?POSTOP DIAGNOSIS:   ? ?Same ? ?PROCEDURE:  ?  ?Bilateral groin wound washout debridement, antibiotic bead placement, VAC placement ? ?SURGEON: Broadus John ? ?ASSIST: None ? ?ANESTHESIA: General ? ?EBL: 10 mL ? ?INDICATIONS:  ? ? Kevin Gross is a morbidly obese 66 y.o. male status post 11/26/2021 bilateral femoral endarterectomy who presented to the hospital with wound dehiscence, foul-smelling drainage.  Initial Gram stain demonstrated staph, therefore patient was started on broad-spectrum antibiotics.  After discussing the risk and benefits of bilateral groin washout debridement, VAC placement, Kevin Gross elected to proceed. ? ?FINDINGS:  ? ?Seromas appreciated bilaterally, no purulence appreciated. ?Wound bed size on the right 8 x 10 x 8cm, left 8 x 4 x 4cm ? ?TECHNIQUE:  ? ?Patient was brought to the OR laid in the supine position.  General anesthesia was induced and the patient was prepped draped standard fashion.  A timeout was performed. ? ?Bilateral groin wounds were opened.  Hematoma was appreciated in both left and right groins with large seroma pockets.  The seroma in the right was much larger than the left.  Wounds were washed out with copious amounts of antibiotic saline.  Nonviable tissue was debrided.  Cultures were sent to microbiology.  The vessel was not exposed in the wound bed. Antibiotic beads consisting of vancomycin and gentamicin were made and placed in the wounds.  Wound beds were measured-see above.  VAC dressings were brought onto the field, cut to size, and placed in bilateral groins. ? ?Patient will likely require return the OR at the end of the week for repeat washout and debridement due to the depth of the right sided groin wound. ? ?At case completion, the patient had bilateral VAC dressings to the groins. ? ? ?Macie Burows, MD ?[facility] ? ?DATE OF DICTATION:   12/14/2021 ? ?

## 2021-12-15 DIAGNOSIS — I898 Other specified noninfective disorders of lymphatic vessels and lymph nodes: Secondary | ICD-10-CM | POA: Diagnosis not present

## 2021-12-15 DIAGNOSIS — T8189XA Other complications of procedures, not elsewhere classified, initial encounter: Secondary | ICD-10-CM | POA: Diagnosis not present

## 2021-12-15 LAB — BASIC METABOLIC PANEL
Anion gap: 3 — ABNORMAL LOW (ref 5–15)
BUN: 34 mg/dL — ABNORMAL HIGH (ref 8–23)
CO2: 24 mmol/L (ref 22–32)
Calcium: 8.3 mg/dL — ABNORMAL LOW (ref 8.9–10.3)
Chloride: 113 mmol/L — ABNORMAL HIGH (ref 98–111)
Creatinine, Ser: 1.85 mg/dL — ABNORMAL HIGH (ref 0.61–1.24)
GFR, Estimated: 40 mL/min — ABNORMAL LOW (ref >60)
Glucose, Bld: 109 mg/dL — ABNORMAL HIGH (ref 70–99)
Potassium: 5 mmol/L (ref 3.5–5.1)
Sodium: 140 mmol/L (ref 135–145)

## 2021-12-15 LAB — CBC
HCT: 22.7 % — ABNORMAL LOW (ref 39.0–52.0)
Hemoglobin: 7.1 g/dL — ABNORMAL LOW (ref 13.0–17.0)
MCH: 29.8 pg (ref 26.0–34.0)
MCHC: 31.3 g/dL (ref 30.0–36.0)
MCV: 95.4 fL (ref 80.0–100.0)
Platelets: 376 10*3/uL (ref 150–400)
RBC: 2.38 MIL/uL — ABNORMAL LOW (ref 4.22–5.81)
RDW: 17 % — ABNORMAL HIGH (ref 11.5–15.5)
WBC: 7.1 10*3/uL (ref 4.0–10.5)
nRBC: 0 % (ref 0.0–0.2)

## 2021-12-15 LAB — FOLATE: Folate: 13.7 ng/mL (ref >5.9)

## 2021-12-15 LAB — IRON AND TIBC
Iron: 32 ug/dL — ABNORMAL LOW (ref 45–182)
Saturation Ratios: 13 % — ABNORMAL LOW (ref 17.9–39.5)
TIBC: 245 ug/dL — ABNORMAL LOW (ref 250–450)
UIBC: 213 ug/dL

## 2021-12-15 LAB — RETICULOCYTES
Immature Retic Fract: 36.1 % — ABNORMAL HIGH (ref 2.3–15.9)
RBC.: 3.1 MIL/uL — ABNORMAL LOW (ref 4.22–5.81)
Retic Count, Absolute: 119 10*3/uL (ref 19.0–186.0)
Retic Ct Pct: 3.8 % — ABNORMAL HIGH (ref 0.4–3.1)

## 2021-12-15 LAB — GLUCOSE, CAPILLARY
Glucose-Capillary: 91 mg/dL (ref 70–99)
Glucose-Capillary: 94 mg/dL (ref 70–99)
Glucose-Capillary: 96 mg/dL (ref 70–99)
Glucose-Capillary: 107 mg/dL — ABNORMAL HIGH (ref 70–99)

## 2021-12-15 LAB — VITAMIN B12: Vitamin B-12: 556 pg/mL (ref 180–914)

## 2021-12-15 LAB — FERRITIN: Ferritin: 616 ng/mL — ABNORMAL HIGH (ref 24–336)

## 2021-12-15 MED ORDER — FUROSEMIDE 10 MG/ML IJ SOLN
40.0000 mg | Freq: Two times a day (BID) | INTRAMUSCULAR | Status: AC
  Administered 2021-12-15 (×2): 40 mg via INTRAVENOUS
  Filled 2021-12-15 (×2): qty 4

## 2021-12-15 MED ORDER — POLYETHYLENE GLYCOL 3350 17 G PO PACK
17.0000 g | PACK | Freq: Every day | ORAL | Status: DC
  Administered 2021-12-15 – 2021-12-19 (×2): 17 g via ORAL
  Filled 2021-12-15 (×2): qty 1

## 2021-12-15 MED ORDER — ALBUMIN HUMAN 25 % IV SOLN
25.0000 g | Freq: Four times a day (QID) | INTRAVENOUS | Status: AC
  Administered 2021-12-15 (×3): 25 g via INTRAVENOUS
  Filled 2021-12-15 (×2): qty 100

## 2021-12-15 MED ORDER — ALBUMIN HUMAN 5 % IV SOLN: 25.0000 g | Freq: Four times a day (QID) | INTRAVENOUS | Status: DC

## 2021-12-15 NOTE — Progress Notes (Signed)
?PROGRESS NOTE ? ? ? ?Kevin Gross  XNA:[id number] DOB: 05/31/1956 DOA: 12/12/2021 ?PCP: Rutherford Limerick, PA  ?NarrativeAnthony KARIEM Gross is a 66 y.o. male with past medical history of alcoholic cirrhosis; COPD; chronic diastolic CHF; HLD; HTN; pre-DM; morbid obesity; transient afib; and OSA on CPAP presenting with wound infection following bifemoral endarterectomy on 3/31.  He reports drainage from L > R groin recently.  He has had some bleeding and edema, including scrotal edema.  ?-Diagnosed with infected left groin lymphocele, admitted by vascular surgery, TR[facility] consulted for medical management ? ? ?Subjective: ?-Feels okay, denies any complaints ? ?Assessment and Plan: ? ?Infected lymphocele following femoral endarterectomy ?-L groin wound with dehiscence and foul-smelling drainage ?-Per vascular surgery, underwent wound washout, debridement, antibiotic bead placement and wound VAC placement 4/18 ?-Plan for repeat OR tomorrow ?-Gram stain polymicrobial, culture with few Klebsiella reintubated ?-Continue current broad-spectrum antibiotics ? ?Volume overload ?Alcoholic cirrhosis of liver without ascites ([place]) ?-Mild LFT elevation ?-Quit drinking EtOH few years ago ?-On oral diuretics at baseline, add IV Lasix today ?-Echo with preserved EF, indeterminate diastolic parameters 5/42 ? ?Worsening anemia ?-Suspect component of blood loss and hemodilution ?Acute on chronic, check anemia panel ?-Continue PPI, anticipate need for transfusion in 1 to 2 days ? ?PAF (paroxysmal atrial fibrillation) ([place]) ?-Had transient A-fib during recent hospitalization, left on dual antiplatelet therapy on account of extensive peripheral vascular procedure with stents, cardiology on 410 recommended dual antiplatelet therapy for 4 weeks after which DOAC could be considered if 1 antiplatelet agent can be stopped, now in sinus rhythm, sinus bradycardia ? ?Essential hypertension ?-BP stable, atenolol held for bradycardia and  chlorthalidone/lisinopril for AKI ?-Discontinue IV fluids ? ?OSA (obstructive sleep apnea) ?-Continue CPAP ? ?Pre-diabetes ?-Recent A1c was 5.6  ?-CBGs are stable ? ?PAD (peripheral artery disease) ([place]) ?-Recent bifemoral procedure ?-Vascular surgery is managing ?-Continue ASA and Plavix ? ?Morbid obesity ([place]) ?-Body mass index is 44.85 kg/m?. ?-Weight loss should be encouraged ?-Outpatient PCP/bariatric medicine f/u encouraged ? ?Mixed hyperlipidemia ?-Continue Zocor, Zetia ? ?Chronic kidney disease, stage 3b ([place]) ?-Mild AKI in the setting of stage 3b CKD ?-Holding lisinopril, chlorthalidone ? ?COPD (chronic obstructive pulmonary disease) ([place]) ?-He does not appear to be taking medications for this issue currently ? ?DVT prophylaxis: Lovenox ?Code Status: Full code ? ? ?Procedures: Bilateral groin wound washout debridement, antibiotic bead placement, VAC placement by Dr. Unk Lightning 4/18 ? ?Antimicrobials:  ? ? ?Objective: ?Vitals:  ? 12/14/21 2326 12/15/21 0330 12/15/21 0742 12/15/21 0800  ?BP: (!) 105/55 (!) 105/54 (!) 127/57   ?Pulse: 60 (!) 55 62 67  ?Resp: '18 12 13 '$ (!) 22  ?Temp: 97.8 ?F (36.6 ?C) 98 ?F (36.7 ?C) 98.2 ?F (36.8 ?C)   ?TempSrc: Oral Axillary Oral   ?SpO2: 100% 99% 100% 99%  ?Weight:      ?Height:      ? ? ?Intake/Output Summary (Last 24 hours) at 12/15/2021 1124 ?Last data filed at 12/15/2021 0745 ?Gross per 24 hour  ?Intake 1160 ml  ?Output 1480 ml  ?Net -320 ml  ? ?Filed Weights  ? 12/13/21 1108  ?Weight: 133.8 kg  ? ? ?Examination: ? ?General exam: Obese male, sitting up in bed, AAOx3 no distress ?HEENT: Neck obese unable to assess JVD ?CVS: S1-S2, regular rhythm ?Lungs: Decreased breath sounds both bases ?Abdomen firm, mildly distended, lateral abdominal wall edema ?Extremities: Trace edema noted  ?Psychiatry:  Mood & affect appropriate.  ? ? ? ?Data Reviewed:  ? ?CBC: ?Recent Labs  ?  Lab 12/12/21 ?1702 12/12/21 ?1715 12/13/21 ?0002 12/14/21 ?0303 12/15/21 ?0200  ?WBC 6.7  --  6.5 4.6 7.1   ?NEUTROABS 4.6  --   --   --   --   ?HGB 8.6* 9.2* 8.1* 7.9* 7.1*  ?HCT 28.8* 27.0* 27.3* 25.6* 22.7*  ?MCV 97.6  --  97.8 95.9 95.4  ?PLT 496*  --  458* 407* 376  ? ?Basic Metabolic Panel: ?Recent Labs  ?Lab 12/12/21 ?1702 12/12/21 ?1715 12/13/21 ?0002 12/14/21 ?0303 12/15/21 ?0200  ?NA 136 137 137 137 140  ?K 4.3 4.3 4.4 5.6* 5.0  ?CL 107 107 106 106 113*  ?CO2 22  --  21* 23 24  ?GLUCOSE 107* 102* 99 139* 109*  ?BUN 43* 42* 48* 36* 34*  ?CREATININE 2.20* 2.30* 2.30* 1.90* 1.85*  ?CALCIUM 8.5*  --  8.5* 8.5* 8.3*  ? ?GFR: ?Estimated Creatinine Clearance: 54 mL/min (A) (by C-G formula based on SCr of 1.85 mg/dL (H)). ?Liver Function Tests: ?Recent Labs  ?Lab 12/12/21 ?1702 12/13/21 ?0002  ?AST 69* 52*  ?ALT 62* 53*  ?ALKPHOS 57 56  ?BILITOT 1.2 0.8  ?PROT 6.6 6.5  ?ALBUMIN 2.5* 2.5*  ? ?No results for input(s): LIPASE, AMYLASE in the last 168 hours. ?No results for input(s): AMMONIA in the last 168 hours. ?Coagulation Profile: ?Recent Labs  ?Lab 12/13/21 ?0002  ?INR 1.2  ? ?Cardiac Enzymes: ?No results for input(s): CKTOTAL, CKMB, CKMBINDEX, TROPONINI in the last 168 hours. ?BNP (last 3 results) ?No results for input(s): PROBNP in the last 8760 hours. ?HbA1C: ?No results for input(s): HGBA1C in the last 72 hours. ?CBG: ?Recent Labs  ?Lab 12/14/21 ?1156 12/14/21 ?1611 12/14/21 ?2112 12/15/21 ?0612  ?GLUCAP 113* 119* 144* 91  ? ?Lipid Profile: ?No results for input(s): CHOL, HDL, LDLCALC, TRIG, CHOLHDL, LDLDIRECT in the last 72 hours. ?Thyroid Function Tests: ?No results for input(s): TSH, T4TOTAL, FREET4, T3FREE, THYROIDAB in the last 72 hours. ?Anemia Panel: ?Recent Labs  ?  12/15/21 ?0242  ?RETICCTPCT 3.8*  ? ?Urine analysis: ?   ?Component Value Date/Time  ? [place] YELLOW 12/13/2021 0705  ? APPEARANCEUR CLEAR 12/13/2021 0705  ? LABSPEC 1.015 12/13/2021 0705  ? PHURINE 5.0 12/13/2021 0705  ? GLUCOSEU NEGATIVE 12/13/2021 0705  ? [place] NEGATIVE 12/13/2021 0705  ? [place] NEGATIVE 12/13/2021 0705  ?  [place] NEGATIVE 12/13/2021 0705  ? [place] NEGATIVE 12/13/2021 0705  ? NITRITE NEGATIVE 12/13/2021 0705  ? LEUKOCYTESUR NEGATIVE 12/13/2021 0705  ? ?Sepsis Labs: ?'@LABRCNTIP'$ (procalcitonin:4,lacticidven:4) ? ?) ?Recent Results (from the past 240 hour(s))  ?Aerobic Culture w Gram Stain (superficial specimen)     Status: None (Preliminary result)  ? Collection Time: 12/13/21 12:02 AM  ? Specimen: Wound  ?Result Value Ref Range Status  ? Specimen Description WOUND LEFT GROIN  Final  ? Special Requests NONE  Final  ? Gram Stain   Final  ?  FEW WBC PRESENT, PREDOMINANTLY PMN ?FEW GRAM POSITIVE COCCI IN CLUSTERS ?RARE GRAM POSITIVE RODS ?  ? Culture   Final  ?  FEW KLEBSIELLA OXYTOCA ?CULTURE REINCUBATED FOR BETTER GROWTH ?Performed at [facility], [place] [address], [place], [place] [address] ?  ? Report Status PENDING  Incomplete  ? Organism ID, Bacteria KLEBSIELLA OXYTOCA  Final  ?    Susceptibility  ? Klebsiella oxytoca - MIC*  ?  AMPICILLIN >=32 RESISTANT Resistant   ?  CEFAZOLIN <=4 SENSITIVE Sensitive   ?  CEFEPIME <=0.12 SENSITIVE Sensitive   ?  CEFTAZIDIME <=1 SENSITIVE Sensitive   ?  CEFTRIAXONE <=0.25 SENSITIVE Sensitive   ?  CIPROFLOXACIN <=0.25 SENSITIVE Sensitive   ?  GENTAMICIN <=1 SENSITIVE Sensitive   ?  IMIPENEM <=0.25 SENSITIVE Sensitive   ?  TRIMETH/SULFA <=20 SENSITIVE Sensitive   ?  AMPICILLIN/SULBACTAM 8 SENSITIVE Sensitive   ?  PIP/TAZO 16 SENSITIVE Sensitive   ?  * FEW KLEBSIELLA OXYTOCA  ?Aerobic/Anaerobic Culture w Gram Stain (surgical/deep wound)     Status: None (Preliminary result)  ? Collection Time: 12/13/21  1:28 PM  ? Specimen: Groin, Right; Wound  ?Result Value Ref Range Status  ? Specimen Description WOUND  Final  ? Special Requests RIGHT GROIN SPEC A  Final  ? Gram Stain NO WBC SEEN ?NO ORGANISMS SEEN ?  Final  ? Culture   Final  ?  CULTURE REINCUBATED FOR BETTER GROWTH ?Performed at [facility], [place] [address], [place], [place] [address] ?  ? Report Status PENDING   Incomplete  ?  ? ?Radiology Studies: ?No results found. ? ? ?Scheduled Meds: ? allopurinol  300 mg Oral Daily  ? amiodarone  200 mg Oral Daily  ? aspirin EC  81 mg Oral Daily  ? cholecalciferol  2,000 Units Oral Daily  ? clopidogrel  75

## 2021-12-15 NOTE — Progress Notes (Addendum)
?  Progress Note ? ? ? ?12/15/2021 ?8:39 AM ?2 Days Post-Op ? ?Subjective:  says he feels pretty good ? ?Afebrile ? ? ?Vitals:  ? 12/15/21 0330 12/15/21 0742  ?BP: (!) 105/54 (!) 127/57  ?Pulse: (!) 55 62  ?Resp: 12 13  ?Temp: 98 ?F (36.7 ?C) 98.2 ?F (36.8 ?C)  ?SpO2: 99% 100%  ? ? ?Physical Exam: ?Cardiac:  irregular ?Lungs:  non labored ?Incisions:  bilateral groins with vacs with good seal ?Extremities:  brisk bilateral PT doppler signals ? ? ?CBC ?   ?Component Value Date/Time  ? WBC 7.1 12/15/2021 0200  ? RBC 2.38 (L) 12/15/2021 0200  ? HGB 7.1 (L) 12/15/2021 0200  ? HGB 13.3 10/12/2021 1314  ? HCT 22.7 (L) 12/15/2021 0200  ? HCT 39.4 10/12/2021 1314  ? PLT 376 12/15/2021 0200  ? PLT 175 10/12/2021 1314  ? MCV 95.4 12/15/2021 0200  ? MCV 97 10/12/2021 1314  ? MCH 29.8 12/15/2021 0200  ? MCHC 31.3 12/15/2021 0200  ? RDW 17.0 (H) 12/15/2021 0200  ? RDW 13.1 10/12/2021 1314  ? LYMPHSABS 0.9 12/12/2021 1702  ? LYMPHSABS 1.9 10/12/2021 1314  ? MONOABS 0.7 12/12/2021 1702  ? EOSABS 0.4 12/12/2021 1702  ? EOSABS 0.7 (H) 10/12/2021 1314  ? BASOSABS 0.0 12/12/2021 1702  ? BASOSABS 0.1 10/12/2021 1314  ? ? ?BMET ?   ?Component Value Date/Time  ? NA 140 12/15/2021 0200  ? NA 145 (H) 10/12/2021 1314  ? K 5.0 12/15/2021 0200  ? CL 113 (H) 12/15/2021 0200  ? CO2 24 12/15/2021 0200  ? GLUCOSE 109 (H) 12/15/2021 0200  ? BUN 34 (H) 12/15/2021 0200  ? BUN 37 (H) 10/12/2021 1314  ? CREATININE 1.85 (H) 12/15/2021 0200  ? CALCIUM 8.3 (L) 12/15/2021 0200  ? GFRNONAA 40 (L) 12/15/2021 0200  ? GFRAA 56 (L) 10/26/2019 0551  ? ? ?INR ?   ?Component Value Date/Time  ? INR 1.2 12/13/2021 0002  ? ? ? ?Intake/Output Summary (Last 24 hours) at 12/15/2021 0839 ?Last data filed at 12/15/2021 0745 ?Gross per 24 hour  ?Intake 1160 ml  ?Output 1480 ml  ?Net -320 ml  ? ? ? ?Assessment/Plan:  66 y.o. male is s/p:  ?Bilateral groin wound washout debridement, antibiotic bead placement, VAC placement  ?2 Days Post-Op ? ? ?-pt with brisk bilateral PT  doppler signals ?-bilateral groin vacs with good seal ?-plan for washout of both groins in OR tomorrow.  ?-npo after MN/consent/labs ? ? ?[name], PA-C ?[facility] ?[phone] ?12/15/2021 ?8:39 AM ? ?I agree with the above.  Plan for surgical washout tomorrow ? ?[place] Lauri Purdum ? ?

## 2021-12-15 NOTE — H&P (View-Only) (Signed)
?  Progress Note ? ? ? ?12/15/2021 ?8:39 AM ?2 Days Post-Op ? ?Subjective:  says he feels pretty good ? ?Afebrile ? ? ?Vitals:  ? 12/15/21 0330 12/15/21 0742  ?BP: (!) 105/54 (!) 127/57  ?Pulse: (!) 55 62  ?Resp: 12 13  ?Temp: 98 ?F (36.7 ?C) 98.2 ?F (36.8 ?C)  ?SpO2: 99% 100%  ? ? ?Physical Exam: ?Cardiac:  irregular ?Lungs:  non labored ?Incisions:  bilateral groins with vacs with good seal ?Extremities:  brisk bilateral PT doppler signals ? ? ?CBC ?   ?Component Value Date/Time  ? WBC 7.1 12/15/2021 0200  ? RBC 2.38 (L) 12/15/2021 0200  ? HGB 7.1 (L) 12/15/2021 0200  ? HGB 13.3 10/12/2021 1314  ? HCT 22.7 (L) 12/15/2021 0200  ? HCT 39.4 10/12/2021 1314  ? PLT 376 12/15/2021 0200  ? PLT 175 10/12/2021 1314  ? MCV 95.4 12/15/2021 0200  ? MCV 97 10/12/2021 1314  ? MCH 29.8 12/15/2021 0200  ? MCHC 31.3 12/15/2021 0200  ? RDW 17.0 (H) 12/15/2021 0200  ? RDW 13.1 10/12/2021 1314  ? LYMPHSABS 0.9 12/12/2021 1702  ? LYMPHSABS 1.9 10/12/2021 1314  ? MONOABS 0.7 12/12/2021 1702  ? EOSABS 0.4 12/12/2021 1702  ? EOSABS 0.7 (H) 10/12/2021 1314  ? BASOSABS 0.0 12/12/2021 1702  ? BASOSABS 0.1 10/12/2021 1314  ? ? ?BMET ?   ?Component Value Date/Time  ? NA 140 12/15/2021 0200  ? NA 145 (H) 10/12/2021 1314  ? K 5.0 12/15/2021 0200  ? CL 113 (H) 12/15/2021 0200  ? CO2 24 12/15/2021 0200  ? GLUCOSE 109 (H) 12/15/2021 0200  ? BUN 34 (H) 12/15/2021 0200  ? BUN 37 (H) 10/12/2021 1314  ? CREATININE 1.85 (H) 12/15/2021 0200  ? CALCIUM 8.3 (L) 12/15/2021 0200  ? GFRNONAA 40 (L) 12/15/2021 0200  ? GFRAA 56 (L) 10/26/2019 0551  ? ? ?INR ?   ?Component Value Date/Time  ? INR 1.2 12/13/2021 0002  ? ? ? ?Intake/Output Summary (Last 24 hours) at 12/15/2021 0839 ?Last data filed at 12/15/2021 0745 ?Gross per 24 hour  ?Intake 1160 ml  ?Output 1480 ml  ?Net -320 ml  ? ? ? ?Assessment/Plan:  66 y.o. male is s/p:  ?Bilateral groin wound washout debridement, antibiotic bead placement, VAC placement  ?2 Days Post-Op ? ? ?-pt with brisk bilateral PT  doppler signals ?-bilateral groin vacs with good seal ?-plan for washout of both groins in OR tomorrow.  ?-npo after MN/consent/labs ? ? ?[name], PA-C ?[facility] ?[phone] ?12/15/2021 ?8:39 AM ? ?I agree with the above.  Plan for surgical washout tomorrow ? ?Kevin Gross ? ?

## 2021-12-15 NOTE — Progress Notes (Signed)
Pt states he will wear CPAP at bedtime.  Advised patient I will be back and help him administer.  ?

## 2021-12-15 NOTE — Progress Notes (Signed)
Pharmacy Antibiotic Note ? ?Kevin Gross is a 66 y.o. male admitted on 12/12/2021 with L groin wound with persistent lymphatic drainage.  Pharmacy has been consulted for piperacillin/tazobactam and vancomycin dosing. ? ?Plan: ?Continue vancomycin '1000mg'$  q24h  ?Continue piperacillin/tazobactam 3.'375mg'$  Q8 hr EI ?Monitor cultures, clinical status, renal function, vancomycin level ?Narrow abx as able and f/u duration  ? ? ?Temp (24hrs), Avg:97.9 ?F (36.6 ?C), Min:97.5 ?F (36.4 ?C), Max:98.2 ?F (36.8 ?C) ? ?Recent Labs  ?Lab 12/12/21 ?1702 12/12/21 ?1715 12/13/21 ?0002 12/14/21 ?0303 12/15/21 ?0200  ?WBC 6.7  --  6.5 4.6 7.1  ?CREATININE 2.20* 2.30* 2.30* 1.90* 1.85*  ? ?  ?Estimated Creatinine Clearance: 54 mL/min (A) (by C-G formula based on SCr of 1.85 mg/dL (H)).   ? ?No Known Allergies ? ?Antimicrobials this admission: ?Zosyn 4/16 >>  ?vanc 4/16 >>  ? ?Dose adjustments this admission: ?N/a ? ?Microbiology results: ?4.17 L groin: rare kleb oxytoca (R amp), reincubated ?4/17 R groin: no orgs, reincubated ?  ? ?Thank you for allowing pharmacy to be a part of this patient?s care. ? ?Cristela Felt, PharmD, BCPS ?Clinical Pharmacist ?12/15/2021 11:04 AM ? ? ?

## 2021-12-15 NOTE — Progress Notes (Signed)
Pt placed on nasal CPAP for night rest.  Tolerating well. ?

## 2021-12-16 ENCOUNTER — Other Ambulatory Visit: Payer: Self-pay

## 2021-12-16 ENCOUNTER — Encounter (HOSPITAL_COMMUNITY)
Admission: EM | Disposition: A | Discharge status: Home Health | Payer: Self-pay | Source: Home / Self Care | Attending: Surgery

## 2021-12-16 ENCOUNTER — Encounter (HOSPITAL_COMMUNITY): Payer: Self-pay | Admitting: Surgery

## 2021-12-16 ENCOUNTER — Inpatient Hospital Stay (HOSPITAL_COMMUNITY): Payer: Medicare PPO | Admitting: Anesthesiology

## 2021-12-16 DIAGNOSIS — T8142XD Infection following a procedure, deep incisional surgical site, subsequent encounter: Secondary | ICD-10-CM

## 2021-12-16 DIAGNOSIS — I70213 Atherosclerosis of native arteries of extremities with intermittent claudication, bilateral legs: Secondary | ICD-10-CM

## 2021-12-16 DIAGNOSIS — T8189XA Other complications of procedures, not elsewhere classified, initial encounter: Secondary | ICD-10-CM | POA: Diagnosis not present

## 2021-12-16 DIAGNOSIS — G4733 Obstructive sleep apnea (adult) (pediatric): Secondary | ICD-10-CM

## 2021-12-16 DIAGNOSIS — J449 Chronic obstructive pulmonary disease, unspecified: Secondary | ICD-10-CM

## 2021-12-16 DIAGNOSIS — Z9989 Dependence on other enabling machines and devices: Secondary | ICD-10-CM

## 2021-12-16 DIAGNOSIS — I898 Other specified noninfective disorders of lymphatic vessels and lymph nodes: Secondary | ICD-10-CM | POA: Diagnosis not present

## 2021-12-16 DIAGNOSIS — S31109A Unspecified open wound of abdominal wall, unspecified quadrant without penetration into peritoneal cavity, initial encounter: Secondary | ICD-10-CM

## 2021-12-16 HISTORY — PX: GROIN DEBRIDEMENT: SHX5159

## 2021-12-16 LAB — CBC
HCT: 24.3 % — ABNORMAL LOW (ref 39.0–52.0)
HCT: 23.6 % — ABNORMAL LOW (ref 39.0–52.0)
Hemoglobin: 7.6 g/dL — ABNORMAL LOW (ref 13.0–17.0)
Hemoglobin: 7.3 g/dL — ABNORMAL LOW (ref 13.0–17.0)
MCH: 29.7 pg (ref 26.0–34.0)
MCH: 29.4 pg (ref 26.0–34.0)
MCHC: 31.3 g/dL (ref 30.0–36.0)
MCHC: 30.9 g/dL (ref 30.0–36.0)
MCV: 94.9 fL (ref 80.0–100.0)
MCV: 95.2 fL (ref 80.0–100.0)
Platelets: 417 10*3/uL — ABNORMAL HIGH (ref 150–400)
Platelets: 426 10*3/uL — ABNORMAL HIGH (ref 150–400)
RBC: 2.56 MIL/uL — ABNORMAL LOW (ref 4.22–5.81)
RBC: 2.48 MIL/uL — ABNORMAL LOW (ref 4.22–5.81)
RDW: 17.4 % — ABNORMAL HIGH (ref 11.5–15.5)
RDW: 17.3 % — ABNORMAL HIGH (ref 11.5–15.5)
WBC: 6.3 10*3/uL (ref 4.0–10.5)
WBC: 6.8 10*3/uL (ref 4.0–10.5)
nRBC: 0 % (ref 0.0–0.2)
nRBC: 0 % (ref 0.0–0.2)

## 2021-12-16 LAB — GLUCOSE, CAPILLARY
Glucose-Capillary: 81 mg/dL (ref 70–99)
Glucose-Capillary: 79 mg/dL (ref 70–99)
Glucose-Capillary: 101 mg/dL — ABNORMAL HIGH (ref 70–99)
Glucose-Capillary: 107 mg/dL — ABNORMAL HIGH (ref 70–99)
Glucose-Capillary: 131 mg/dL — ABNORMAL HIGH (ref 70–99)

## 2021-12-16 LAB — AEROBIC CULTURE W GRAM STAIN (SUPERFICIAL SPECIMEN)

## 2021-12-16 LAB — BASIC METABOLIC PANEL
Anion gap: 10 (ref 5–15)
BUN: 38 mg/dL — ABNORMAL HIGH (ref 8–23)
CO2: 26 mmol/L (ref 22–32)
Calcium: 9.4 mg/dL (ref 8.9–10.3)
Chloride: 103 mmol/L (ref 98–111)
Creatinine, Ser: 2.03 mg/dL — ABNORMAL HIGH (ref 0.61–1.24)
GFR, Estimated: 36 mL/min — ABNORMAL LOW (ref >60)
Glucose, Bld: 92 mg/dL (ref 70–99)
Potassium: 4.4 mmol/L (ref 3.5–5.1)
Sodium: 139 mmol/L (ref 135–145)

## 2021-12-16 SURGERY — DEBRIDEMENT, INGUINAL REGION
Anesthesia: General | Site: Groin | Laterality: Bilateral

## 2021-12-16 MED ORDER — PROPOFOL 10 MG/ML IV BOLUS
INTRAVENOUS | Status: DC | PRN
  Administered 2021-12-16: 110 mg via INTRAVENOUS

## 2021-12-16 MED ORDER — SENNOSIDES-DOCUSATE SODIUM 8.6-50 MG PO TABS
1.0000 | ORAL_TABLET | Freq: Two times a day (BID) | ORAL | Status: DC
  Administered 2021-12-16 – 2021-12-23 (×10): 1 via ORAL
  Filled 2021-12-16 (×12): qty 1

## 2021-12-16 MED ORDER — ROCURONIUM BROMIDE 10 MG/ML (PF) SYRINGE
PREFILLED_SYRINGE | INTRAVENOUS | Status: DC | PRN
  Administered 2021-12-16: 20 mg via INTRAVENOUS

## 2021-12-16 MED ORDER — CHLORHEXIDINE GLUCONATE 0.12 % MT SOLN
OROMUCOSAL | Status: AC
  Administered 2021-12-16: 15 mL
  Filled 2021-12-16: qty 15

## 2021-12-16 MED ORDER — CHLORHEXIDINE GLUCONATE 0.12 % MT SOLN: 15.0000 mL | Freq: Once | OROMUCOSAL | Status: AC

## 2021-12-16 MED ORDER — PROPOFOL 10 MG/ML IV BOLUS
INTRAVENOUS | Status: AC
  Filled 2021-12-16: qty 20

## 2021-12-16 MED ORDER — FENTANYL CITRATE (PF) 250 MCG/5ML IJ SOLN
INTRAMUSCULAR | Status: AC
  Filled 2021-12-16: qty 5

## 2021-12-16 MED ORDER — SODIUM CHLORIDE 0.9 % IV SOLN: INTRAVENOUS | Status: DC

## 2021-12-16 MED ORDER — PHENYLEPHRINE HCL-NACL 20-0.9 MG/250ML-% IV SOLN
INTRAVENOUS | Status: DC | PRN
  Administered 2021-12-16: 20 ug/min via INTRAVENOUS

## 2021-12-16 MED ORDER — LIDOCAINE 2% (20 MG/ML) 5 ML SYRINGE
INTRAMUSCULAR | Status: DC | PRN
  Administered 2021-12-16: 60 mg via INTRAVENOUS

## 2021-12-16 MED ORDER — INSULIN ASPART 100 UNIT/ML IJ SOLN: 0.0000 [IU] | INTRAMUSCULAR | Status: DC | PRN

## 2021-12-16 MED ORDER — SODIUM CHLORIDE 0.9 % IV SOLN
510.0000 mg | INTRAVENOUS | Status: DC
  Administered 2021-12-16: 510 mg via INTRAVENOUS
  Filled 2021-12-16: qty 17

## 2021-12-16 MED ORDER — FENTANYL CITRATE (PF) 100 MCG/2ML IJ SOLN
25.0000 ug | INTRAMUSCULAR | Status: DC | PRN
  Administered 2021-12-16 (×2): 50 ug via INTRAVENOUS

## 2021-12-16 MED ORDER — MIDAZOLAM HCL 2 MG/2ML IJ SOLN
INTRAMUSCULAR | Status: AC
  Filled 2021-12-16: qty 2

## 2021-12-16 MED ORDER — FENTANYL CITRATE (PF) 250 MCG/5ML IJ SOLN
INTRAMUSCULAR | Status: DC | PRN
  Administered 2021-12-16: 100 ug via INTRAVENOUS
  Administered 2021-12-16: 50 ug via INTRAVENOUS

## 2021-12-16 MED ORDER — EPHEDRINE SULFATE-NACL 50-0.9 MG/10ML-% IV SOSY
PREFILLED_SYRINGE | INTRAVENOUS | Status: DC | PRN
  Administered 2021-12-16: 10 mg via INTRAVENOUS

## 2021-12-16 MED ORDER — ONDANSETRON HCL 4 MG/2ML IJ SOLN
INTRAMUSCULAR | Status: DC | PRN
  Administered 2021-12-16: 4 mg via INTRAVENOUS

## 2021-12-16 MED ORDER — FENTANYL CITRATE (PF) 100 MCG/2ML IJ SOLN
INTRAMUSCULAR | Status: AC
  Filled 2021-12-16: qty 2

## 2021-12-16 MED ORDER — SUCCINYLCHOLINE CHLORIDE 200 MG/10ML IV SOSY
PREFILLED_SYRINGE | INTRAVENOUS | Status: DC | PRN
  Administered 2021-12-16: 140 mg via INTRAVENOUS

## 2021-12-16 MED ORDER — SUGAMMADEX SODIUM 200 MG/2ML IV SOLN
INTRAVENOUS | Status: DC | PRN
Start: 2021-12-16 — End: 2021-12-16
  Administered 2021-12-16: 200 mg via INTRAVENOUS

## 2021-12-16 MED ORDER — TORSEMIDE 20 MG PO TABS
40.0000 mg | ORAL_TABLET | Freq: Every day | ORAL | Status: DC
  Administered 2021-12-16: 40 mg via ORAL
  Filled 2021-12-16: qty 2

## 2021-12-16 MED ORDER — ORAL CARE MOUTH RINSE: 15.0000 mL | Freq: Once | OROMUCOSAL | Status: AC

## 2021-12-16 MED ORDER — MIDAZOLAM HCL 2 MG/2ML IJ SOLN
INTRAMUSCULAR | Status: DC | PRN
  Administered 2021-12-16 (×2): 1 mg via INTRAVENOUS

## 2021-12-16 MED ORDER — 0.9 % SODIUM CHLORIDE (POUR BTL) OPTIME
TOPICAL | Status: DC | PRN
  Administered 2021-12-16: 1000 mL

## 2021-12-16 SURGICAL SUPPLY — 35 items
BAG COUNTER SPONGE SURGICOUNT (BAG) ×2 IMPLANT
BAG SPNG CNTER NS LX DISP (BAG) ×1
CANISTER SUCT 3000ML PPV (MISCELLANEOUS) ×2 IMPLANT
CANISTER WOUNDNEG PRESSURE 500 (CANNISTER) ×1 IMPLANT
CONNECTOR Y ATS VAC SYSTEM (MISCELLANEOUS) ×1 IMPLANT
DRAPE DERMATAC (DRAPES) ×1 IMPLANT
DRAPE U-SHAPE 47X51 STRL (DRAPES) ×1 IMPLANT
DRSG VAC ATS MED SENSATRAC (GAUZE/BANDAGES/DRESSINGS) ×2 IMPLANT
DRSG VERSA FOAM LRG 10X15 (GAUZE/BANDAGES/DRESSINGS) ×1 IMPLANT
ELECT REM PT RETURN 9FT ADLT (ELECTROSURGICAL) ×2
ELECTRODE REM PT RTRN 9FT ADLT (ELECTROSURGICAL) ×1 IMPLANT
GAUZE SPONGE 4X4 12PLY STRL (GAUZE/BANDAGES/DRESSINGS) ×1 IMPLANT
GLOVE BIOGEL PI IND STRL 6.5 (GLOVE) IMPLANT
GLOVE BIOGEL PI INDICATOR 6.5 (GLOVE) ×1
GLOVE SS BIOGEL STRL SZ 7 (GLOVE) ×1 IMPLANT
GLOVE SUPERSENSE BIOGEL SZ 7 (GLOVE) ×1
GLOVE SURG SS PI 6.5 STRL IVOR (GLOVE) ×1 IMPLANT
GOWN STRL REUS W/ TWL LRG LVL3 (GOWN DISPOSABLE) ×3 IMPLANT
GOWN STRL REUS W/TWL LRG LVL3 (GOWN DISPOSABLE) ×4
HANDPIECE INTERPULSE COAX TIP (DISPOSABLE) ×2
KIT BASIN OR (CUSTOM PROCEDURE TRAY) ×2 IMPLANT
KIT TURNOVER KIT B (KITS) ×2 IMPLANT
NS IRRIG 1000ML POUR BTL (IV SOLUTION) ×2 IMPLANT
PACK GENERAL/GYN (CUSTOM PROCEDURE TRAY) ×2 IMPLANT
PACK UNIVERSAL I (CUSTOM PROCEDURE TRAY) ×1 IMPLANT
PAD ARMBOARD 7.5X6 YLW CONV (MISCELLANEOUS) ×4 IMPLANT
PAD NEG PRESSURE SENSATRAC (MISCELLANEOUS) ×1 IMPLANT
SET HNDPC FAN SPRY TIP SCT (DISPOSABLE) IMPLANT
STAPLER VISISTAT 35W (STAPLE) IMPLANT
SUT ETHILON 3 0 PS 1 (SUTURE) IMPLANT
SUT VIC AB 2-0 CTX 36 (SUTURE) IMPLANT
SUT VIC AB 3-0 SH 27 (SUTURE)
SUT VIC AB 3-0 SH 27X BRD (SUTURE) IMPLANT
TOWEL GREEN STERILE (TOWEL DISPOSABLE) ×2 IMPLANT
WATER STERILE IRR 1000ML POUR (IV SOLUTION) ×2 IMPLANT

## 2021-12-16 NOTE — Transfer of Care (Signed)
Immediate Anesthesia Transfer of Care Note ? ?Patient: Kevin Gross ? ?Procedure(s) Performed: IRRIGATION AND DEBRIDEMENT OF BILATERAL GROINS (Bilateral: Groin) ? ?Patient Location: PACU ? ?Anesthesia Type:General ? ?Level of Consciousness: awake ? ?Airway & Oxygen Therapy: Patient Spontanous Breathing ? ?Post-op Assessment: Report given to RN and Post -op Vital signs reviewed and stable ? ?Post vital signs: Reviewed and stable ? ?Last Vitals:  ?Vitals Value Taken Time  ?BP 127/61 12/16/21 1145  ?Temp    ?Pulse 86 12/16/21 1145  ?Resp 13 12/16/21 1145  ?SpO2 98 % 12/16/21 1145  ?Vitals shown include unvalidated device data. ? ?Last Pain:  ?Vitals:  ? 12/16/21 1026  ?TempSrc:   ?PainSc: 0-No pain  ?   ? ?Patients Stated Pain Goal: 0 (12/15/21 2110) ? ?Complications: No notable events documented. ?

## 2021-12-16 NOTE — Progress Notes (Addendum)
? ?  I was called to PACU to assess Mr. Feick due to a clotted left groin wound VAC.  The wound VAC was removed.  The white foam was left in place at the base of the wound bed overlying the common femoral artery.  A new small black foam sponge was placed on top of the white foam.  An immediate seal was obtained however there continues to be copious sanguinous drainage being pulled from the wound.  If the wound VAC system fails again in the left groin it will be removed and we will pack the wound with dry Kerlix. ? ?Dagoberto Ligas, PA-C ?[facility] ?[phone] ?12/16/2021  ?1:52 PM ? ?ADDENDUM: L groin vac system failed again due to ongoing sanguinous drainage.  Wound vac was removed and L groin was packed with kerlix.  R groin wound vac also clotted at the time of exam.  Lilipad was changed and an immediate seal was obtained.  R groin vac will also be removed and packed with Kerlex if sanguinous drainage continues to cause problems. ?

## 2021-12-16 NOTE — Progress Notes (Signed)
?PROGRESS NOTE ? ? ? ?RALLY OUCH  VQQ:[id number] DOB: May 06, 1956 DOA: 12/12/2021 ?PCP: Rutherford Limerick, PA  ?NarrativeAnthony CLAYBORNE Gross is a 66 y.o. male with past medical history of alcoholic cirrhosis; COPD; chronic diastolic CHF; HLD; HTN; pre-DM; morbid obesity; transient afib; and OSA on CPAP presenting with wound infection following bifemoral endarterectomy on 3/31.  He reports drainage from L > R groin recently.  He has had some bleeding and edema, including scrotal edema.  ?-Diagnosed with infected left groin lymphocele, admitted by vascular surgery, TRH consulted for medical management ? ? ?Subjective: ?-Feels okay overall, going back to the OR today ? ?Assessment and Plan: ? ?Infected lymphocele following femoral endarterectomy ?-L groin wound with dehiscence and foul-smelling drainage ?-Per vascular surgery, underwent wound washout, debridement, antibiotic bead placement and wound VAC placement 4/18 ?-Plan for repeat OR today, continue current antibiotics ?-Culture is polymicrobial, MSSA, Klebsiella, Enterococcus faecalis ?-After repeat debridement today could change antibiotics to Ancef and ampicillin at discharge ?-Continued on aspirin, Plavix for complicated PAD ? ?Volume overload ?Alcoholic cirrhosis of liver without ascites ([place]) ?-Mild LFT elevation ?-Quit drinking EtOH few years ago ?-On oral diuretics at baseline, dosed IV Lasix yesterday, start oral torsemide ?-Echo with preserved EF, indeterminate diastolic parameters 4/33 ? ?Iron deficiency anemia ?-Suspect component of blood loss and hemodilution too ?-Anemia panel with iron deficiency, add IV iron ?-Continue PPI, transfuse PRBC if hemoglobin trends down further ? ?PAF (paroxysmal atrial fibrillation) ([place]) ?-Had transient A-fib during recent hospitalization, left on dual antiplatelet therapy on account of extensive peripheral vascular procedure with stents, cardiology note on 4/10 recommended dual antiplatelet therapy for 4 weeks after  which DOAC could be considered if 1 antiplatelet agent can be stopped, now in sinus rhythm, sinus bradycardia ? ?Essential hypertension ?-BP stable, atenolol held for bradycardia and chlorthalidone/lisinopril for AKI ?-Discontinue IV fluids ? ?OSA (obstructive sleep apnea) ?-Continue CPAP ? ?Pre-diabetes ?-Recent A1c was 5.6  ?-CBGs are stable ? ?PAD (peripheral artery disease) ([place]) ?-Recent bifemoral procedure ?-Vascular surgery is managing ?-Continue ASA and Plavix ? ?Morbid obesity ([place]) ?-Body mass index is 44.85 kg/m?. ?-Weight loss should be encouraged ?-Outpatient PCP/bariatric medicine f/u encouraged ? ?Mixed hyperlipidemia ?-Continue Zocor, Zetia ? ?Chronic kidney disease, stage 3b ([place]) ?-Mild AKI in the setting of stage 3b CKD ?-Baseline creatinine around 1.6-2, creatinine relatively stable ?-Lisinopril and chlorthalidone on hold ? ?COPD (chronic obstructive pulmonary disease) ([place]) ?-stab;e ? ?DVT prophylaxis: Lovenox ?Code Status: Full code ? ? ?Procedures: Bilateral groin wound washout debridement, antibiotic bead placement, VAC placement by Dr. Unk Lightning 4/18 ? ?Antimicrobials:  ? ? ?Objective: ?Vitals:  ? 12/15/21 2328 12/16/21 0501 12/16/21 0727 12/16/21 2951  ?BP: (!) 108/56 131/60 (!) 139/59 (!) 146/76  ?Pulse: 63 60 (!) 55 (!) 52  ?Resp: '16 12 20 12  '$ ?Temp: 97.8 ?F (36.6 ?C) (!) 97.5 ?F (36.4 ?C) 98.1 ?F (36.7 ?C) 98.4 ?F (36.9 ?C)  ?TempSrc: Oral Oral  Oral  ?SpO2: 99% 100% 100% 100%  ?Weight:      ?Height:      ? ? ?Intake/Output Summary (Last 24 hours) at 12/16/2021 1127 ?Last data filed at 12/16/2021 0730 ?Gross per 24 hour  ?Intake 0 ml  ?Output 6390 ml  ?Net -6390 ml  ? ?Filed Weights  ? 12/13/21 1108  ?Weight: 133.8 kg  ? ? ?Examination: ? ?General exam: Obese chronically ill male sitting up in bed, AAOx3, no distress ?HEENT: No JVD ?CVS: S1-S2, regular rhythm ?Lungs: Decreased breath sounds at the bases ?Abdomen: Soft,  mildly distended, lateral abdominal wall edema ?Groin wound with wound  vac ?Extremities: Trace edema  ?Psychiatry:  Mood & affect appropriate.  ? ? ? ?Data Reviewed:  ? ?CBC: ?Recent Labs  ?Lab 12/12/21 ?1702 12/12/21 ?1715 12/13/21 ?0002 12/14/21 ?0303 12/15/21 ?0200 12/16/21 ?0239  ?WBC 6.7  --  6.5 4.6 7.1 6.3  ?NEUTROABS 4.6  --   --   --   --   --   ?HGB 8.6* 9.2* 8.1* 7.9* 7.1* 7.6*  ?HCT 28.8* 27.0* 27.3* 25.6* 22.7* 24.3*  ?MCV 97.6  --  97.8 95.9 95.4 94.9  ?PLT 496*  --  458* 407* 376 417*  ? ?Basic Metabolic Panel: ?Recent Labs  ?Lab 12/12/21 ?1702 12/12/21 ?1715 12/13/21 ?0002 12/14/21 ?0303 12/15/21 ?0200 12/16/21 ?0239  ?NA 136 137 137 137 140 139  ?K 4.3 4.3 4.4 5.6* 5.0 4.4  ?CL 107 107 106 106 113* 103  ?CO2 22  --  21* '23 24 26  '$ ?GLUCOSE 107* 102* 99 139* 109* 92  ?BUN 43* 42* 48* 36* 34* 38*  ?CREATININE 2.20* 2.30* 2.30* 1.90* 1.85* 2.03*  ?CALCIUM 8.5*  --  8.5* 8.5* 8.3* 9.4  ? ?GFR: ?Estimated Creatinine Clearance: 49.2 mL/min (A) (by C-G formula based on SCr of 2.03 mg/dL (H)). ?Liver Function Tests: ?Recent Labs  ?Lab 12/12/21 ?1702 12/13/21 ?0002  ?AST 69* 52*  ?ALT 62* 53*  ?ALKPHOS 57 56  ?BILITOT 1.2 0.8  ?PROT 6.6 6.5  ?ALBUMIN 2.5* 2.5*  ? ?No results for input(s): LIPASE, AMYLASE in the last 168 hours. ?No results for input(s): AMMONIA in the last 168 hours. ?Coagulation Profile: ?Recent Labs  ?Lab 12/13/21 ?0002  ?INR 1.2  ? ?Cardiac Enzymes: ?No results for input(s): CKTOTAL, CKMB, CKMBINDEX, TROPONINI in the last 168 hours. ?BNP (last 3 results) ?No results for input(s): PROBNP in the last 8760 hours. ?HbA1C: ?No results for input(s): HGBA1C in the last 72 hours. ?CBG: ?Recent Labs  ?Lab 12/15/21 ?1128 12/15/21 ?1633 12/15/21 ?2052 12/16/21 ?0726 12/16/21 ?0913  ?GLUCAP 94 96 107* 81 79  ? ?Lipid Profile: ?No results for input(s): CHOL, HDL, LDLCALC, TRIG, CHOLHDL, LDLDIRECT in the last 72 hours. ?Thyroid Function Tests: ?No results for input(s): TSH, T4TOTAL, FREET4, T3FREE, THYROIDAB in the last 72 hours. ?Anemia Panel: ?Recent Labs  ?   12/15/21 ?0242 12/15/21 ?0800  ?[id number]  --  556  ?FOLATE  --  13.7  ?FERRITIN  --  616*  ?TIBC  --  245*  ?IRON  --  32*  ?RETICCTPCT 3.8*  --   ? ?Urine analysis: ?   ?Component Value Date/Time  ? [place] YELLOW 12/13/2021 0705  ? APPEARANCEUR CLEAR 12/13/2021 0705  ? LABSPEC 1.015 12/13/2021 0705  ? PHURINE 5.0 12/13/2021 0705  ? GLUCOSEU NEGATIVE 12/13/2021 0705  ? [place] NEGATIVE 12/13/2021 0705  ? [place] NEGATIVE 12/13/2021 0705  ? [place] NEGATIVE 12/13/2021 0705  ? [place] NEGATIVE 12/13/2021 0705  ? NITRITE NEGATIVE 12/13/2021 0705  ? LEUKOCYTESUR NEGATIVE 12/13/2021 0705  ? ?Sepsis Labs: ?'@LABRCNTIP'$ (procalcitonin:4,lacticidven:4) ? ?) ?Recent Results (from the past 240 hour(s))  ?Aerobic Culture w Gram Stain (superficial specimen)     Status: None  ? Collection Time: 12/13/21 12:02 AM  ? Specimen: Wound  ?Result Value Ref Range Status  ? Specimen Description WOUND LEFT GROIN  Final  ? Special Requests NONE  Final  ? Gram Stain   Final  ?  FEW WBC PRESENT, PREDOMINANTLY PMN ?FEW GRAM POSITIVE COCCI IN CLUSTERS ?RARE GRAM POSITIVE RODS ?Performed at  [facility], [place] [address], [place], [place] [address] ?  ? Culture   Final  ?  FEW KLEBSIELLA OXYTOCA ?FEW STAPHYLOCOCCUS AUREUS ?MODERATE ENTEROCOCCUS FAECALIS ?  ? Report Status 12/16/2021 FINAL  Final  ? Organism ID, Bacteria KLEBSIELLA OXYTOCA  Final  ? Organism ID, Bacteria ENTEROCOCCUS FAECALIS  Final  ? Organism ID, Bacteria STAPHYLOCOCCUS AUREUS  Final  ?    Susceptibility  ? Enterococcus faecalis - MIC*  ?  AMPICILLIN <=2 SENSITIVE Sensitive   ?  VANCOMYCIN 1 SENSITIVE Sensitive   ?  GENTAMICIN SYNERGY SENSITIVE Sensitive   ?  * MODERATE ENTEROCOCCUS FAECALIS  ? Klebsiella oxytoca - MIC*  ?  AMPICILLIN >=32 RESISTANT Resistant   ?  CEFAZOLIN <=4 SENSITIVE Sensitive   ?  CEFEPIME <=0.12 SENSITIVE Sensitive   ?  CEFTAZIDIME <=1 SENSITIVE Sensitive   ?  CEFTRIAXONE <=0.25 SENSITIVE Sensitive   ?  CIPROFLOXACIN <=0.25 SENSITIVE  Sensitive   ?  GENTAMICIN <=1 SENSITIVE Sensitive   ?  IMIPENEM <=0.25 SENSITIVE Sensitive   ?  TRIMETH/SULFA <=20 SENSITIVE Sensitive   ?  AMPICILLIN/SULBACTAM 8 SENSITIVE Sensitive   ?  PIP/TAZO 16 SENSITIVE Sensitive   ?

## 2021-12-16 NOTE — Progress Notes (Signed)
R groin dressing w/ large amount saturating dressing, manual pressure held and PA called. VSS. ? ?Clyde Canterbury, RN ? ?

## 2021-12-16 NOTE — Anesthesia Procedure Notes (Signed)
Procedure Name: Intubation ?Date/Time: 12/16/2021 10:49 AM ?Performed by: Erick Colace, CRNA ?Pre-anesthesia Checklist: Patient identified, Emergency Drugs available, Suction available and Patient being monitored ?Patient Re-evaluated:Patient Re-evaluated prior to induction ?Oxygen Delivery Method: Circle system utilized ?Preoxygenation: Pre-oxygenation with 100% oxygen ?Induction Type: IV induction ?Ventilation: Mask ventilation without difficulty and Oral airway inserted - appropriate to patient size ?Laryngoscope Size: Glidescope and 4 ?Grade View: Grade I ?Tube type: Oral ?Tube size: 7.5 mm ?Number of attempts: 1 ?Airway Equipment and Method: Stylet and Oral airway ?Placement Confirmation: ETT inserted through vocal cords under direct vision, positive ETCO2 and breath sounds checked- equal and bilateral ?Secured at: 23 cm ?Tube secured with: Tape ?Dental Injury: Teeth and Oropharynx as per pre-operative assessment  ?Difficulty Due To: Difficulty was anticipated and Difficult Airway- due to dentition ?Comments: Loose front tooth. ? ? ? ? ?

## 2021-12-16 NOTE — Op Note (Signed)
? ? ?  Patient name: Kevin Gross MRN: [id number] DOB: 1956/08/01 Sex: male ? ?12/16/2021 ?Pre-operative Diagnosis: Bilateral groin wounds ?Post-operative diagnosis:  Same ?Surgeon:  Annamarie Major ?Assistants:  Arlee Muslim, PA ?Procedure:   Incision and debridement of bilateral groin wounds including skin and subcutaneous tissue and fat. ?  #2: Placement of bilateral wound vacs (left dimensions or 7 x 6 x 6 cm.  Right was 6 x 5 x 4 cm) ?Anesthesia:  General ?Blood Loss:  minimal ?Specimens:  none ? ?Findings: On the left, necrotic tissue was sharply debrided.  The anterior portion of the vein patch was exposed.  A white sponge was placed followed by black sponge.  On the right, the patch was not visualized. ? ?Indications: This is a 66 year old gentleman with recent extensive iliofemoral revascularization.  He has developed bilateral groin wounds which were debrided in the operating room earlier this week.  He comes back today for further evaluation. ? ?Procedure:  The patient was identified in the holding area and taken to [place] 11  The patient was then placed supine on the table. general anesthesia was administered.  The patient was prepped and draped in the usual sterile fashion.  A time out was called and antibiotics were administered.  A PA was necessary to expedite the procedure and assist with technical details. ? ?The previous wound vacs were removed.  There was a fair amount of necrotic tissue on the left.  I used a 10 blade and sharply removed the necrotic tissue.  I then used a curette to do further debridement of the subcutaneous tissue and fat until I got to healthy tissue and good bleeding.  The vein patch on the artery was exposed anteriorly.  Next, on the right side there was less necrotic tissue, however what was visualized was sharply debrided with a 10 blade.  On this side, the vein patch was not visualized.  I then used a Pulsavac to irrigate both wounds.  A total of 3 L was utilized.  A  black sponge wound VAC was placed on the right with the assistance of the PA.  On the left, I placed a white sponge over top of the artery followed by a black sponge.  Again the PA helped get a good seal with the wound vacs.  Patient was then successfully extubated taken recovery in stable condition. ? ? ?Disposition: To PACU stable. ? ? ?V. Annamarie Major, M.D., FACS ?[facility] ?Office: [phone] ?Pager:  [phone]  ?

## 2021-12-16 NOTE — Anesthesia Preprocedure Evaluation (Signed)
Anesthesia Evaluation  ?Patient identified by MRN, date of birth, ID band ?Patient awake ? ? ? ?Reviewed: ?Allergy & Precautions, NPO status , Patient's Chart, lab work & pertinent test results ? ?Airway ?Mallampati: III ? ?TM Distance: >3 FB ?Neck ROM: Full ? ? ? Dental ? ?(+) Loose, Missing, Dental Advisory Given,  ?  ?Pulmonary ?sleep apnea and Continuous Positive Airway Pressure Ventilation , COPD,  COPD inhaler, former smoker,  ?  ? ?+ decreased breath sounds ? ? ? ? ? Cardiovascular ?hypertension, Pt. on medications and Pt. on home beta blockers ?(-) angina+ CAD, + Peripheral Vascular Disease and +CHF  ? ?Rhythm:Regular Rate:Normal ? ??1. Left ventricular ejection fraction, by estimation, is 55 to 60%. The  ?left ventricle has normal function. The left ventricle has no regional  ?wall motion abnormalities. There is mild left ventricular hypertrophy.  ?Left ventricular diastolic parameters  ?are indeterminate.  ??2. Right ventricular systolic function is normal. The right ventricular  ?size is mildly enlarged.  ??3. The mitral valve is normal in structure. No evidence of mitral valve  ?regurgitation.  ??4. The aortic valve was not well visualized. Aortic valve regurgitation  ?is not visualized.  ??5. Aortic dilatation noted. 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  ??6. The inferior vena cava is normal in size with greater than 50%  ?respiratory variability, suggesting right atrial pressure of 3 mmHg.  ?  ?Neuro/Psych ?negative neurological ROS ? negative psych ROS  ? GI/Hepatic ?negative GI ROS, Neg liver ROS,   ?Endo/Other  ?negative endocrine ROS ? Renal/GU ?CRFRenal diseaseLab Results ?     Component                Value               Date                 ?     CREATININE               2.03 (H)            12/16/2021           ?  ?negative genitourinary ?  ?Musculoskeletal ?Infected left groin  ? Abdominal ?(+) + obese,   ?Peds ? Hematology ? ?(+)  Blood dyscrasia, anemia , Lab Results ?     Component                Value               Date                 ?     WBC                      6.3                 12/16/2021           ?     HGB                      7.6 (L)             12/16/2021           ?     HCT                      24.3 (L)  12/16/2021           ?     MCV                      94.9                12/16/2021           ?     PLT                      417 (H)             12/16/2021           ?   ?Anesthesia Other Findings ? ? Reproductive/Obstetrics ? ?  ? ? ? ? ? ? ? ? ? ? ? ? ? ?  ?  ? ? ? ? ? ? ? ? ?Anesthesia Physical ?Anesthesia Plan ? ?ASA: 3 ? ?Anesthesia Plan: General  ? ?Post-op Pain Management: Ofirmev IV (intra-op)*  ? ?Induction: Intravenous ? ?PONV Risk Score and Plan: 2 and Ondansetron and Dexamethasone ? ?Airway Management Planned: Oral ETT and Video Laryngoscope Planned ? ?Additional Equipment: None ? ?Intra-op Plan:  ? ?Post-operative Plan: Extubation in OR ? ?Informed Consent: I have reviewed the patients History and Physical, chart, labs and discussed the procedure including the risks, benefits and alternatives for the proposed anesthesia with the patient or authorized representative who has indicated his/her understanding and acceptance.  ? ? ? ?Dental advisory given ? ?Plan Discussed with: CRNA ? ?Anesthesia Plan Comments:   ? ? ? ? ? ? ?Anesthesia Quick Evaluation ? ?

## 2021-12-16 NOTE — Care Management Important Message (Signed)
Important Message ? ?Patient Details  ?Name: Kevin Gross ?MRN: [id number] ?Date of Birth: 10-20-1955 ? ? ?Medicare Important Message Given:  Yes ? ? ? ? ?[name] ?12/16/2021, 9:09 AM ?

## 2021-12-16 NOTE — Anesthesia Postprocedure Evaluation (Signed)
Anesthesia Post Note ? ?Patient: XZAVION DOSWELL ? ?Procedure(s) Performed: IRRIGATION AND DEBRIDEMENT OF BILATERAL GROINS (Bilateral: Groin) ? ?  ? ?Patient location during evaluation: PACU ?Anesthesia Type: General ?Level of consciousness: awake and alert ?Pain management: pain level controlled ?Vital Signs Assessment: post-procedure vital signs reviewed and stable ?Respiratory status: spontaneous breathing, nonlabored ventilation, respiratory function stable and patient connected to nasal cannula oxygen ?Cardiovascular status: blood pressure returned to baseline and stable ?Postop Assessment: no apparent nausea or vomiting ?Anesthetic complications: no ? ? ?No notable events documented. ? ?Last Vitals:  ?Vitals:  ? 12/16/21 1215 12/16/21 1245  ?BP: (!) 108/59 (!) 95/50  ?Pulse: 73 75  ?Resp: 12 12  ?Temp:    ?SpO2: 97% 97%  ?  ?Last Pain:  ?Vitals:  ? 12/16/21 1215  ?TempSrc:   ?PainSc: 0-No pain  ? ? ?  ?  ?  ?  ?  ?  ? ?Kayce Betty ? ? ? ? ?

## 2021-12-16 NOTE — Progress Notes (Signed)
Pt received from PACU. VSS. L groin wound vac with "blockage" error and bright red blood leaking from under dressing. PA called. ? ?Clyde Canterbury, RN ? ?

## 2021-12-16 NOTE — Interval H&P Note (Signed)
History and Physical Interval Note: ? ?12/16/2021 ?10:06 AM ? ?Kevin Gross  has presented today for surgery, with the diagnosis of INFECTION.  The various methods of treatment have been discussed with the patient and family. After consideration of risks, benefits and other options for treatment, the patient has consented to  Procedure(s): ?IRRIGATION AND DEBRIDEMENT OF GROIN (Right) as a surgical intervention.  The patient's history has been reviewed, patient examined, no change in status, stable for surgery.  I have reviewed the patient's chart and labs.  Questions were answered to the patient's satisfaction.   ? ? ?Wells Ameir Faria ? ? ?

## 2021-12-17 ENCOUNTER — Encounter (HOSPITAL_COMMUNITY): Payer: Self-pay | Admitting: Surgery

## 2021-12-17 ENCOUNTER — Other Ambulatory Visit: Payer: Self-pay

## 2021-12-17 DIAGNOSIS — T8189XA Other complications of procedures, not elsewhere classified, initial encounter: Secondary | ICD-10-CM | POA: Diagnosis not present

## 2021-12-17 DIAGNOSIS — I898 Other specified noninfective disorders of lymphatic vessels and lymph nodes: Secondary | ICD-10-CM | POA: Diagnosis not present

## 2021-12-17 LAB — BASIC METABOLIC PANEL
Anion gap: 12 (ref 5–15)
BUN: 46 mg/dL — ABNORMAL HIGH (ref 8–23)
CO2: 22 mmol/L (ref 22–32)
Calcium: 9.1 mg/dL (ref 8.9–10.3)
Chloride: 106 mmol/L (ref 98–111)
Creatinine, Ser: 3.28 mg/dL — ABNORMAL HIGH (ref 0.61–1.24)
GFR, Estimated: 20 mL/min — ABNORMAL LOW (ref >60)
Glucose, Bld: 104 mg/dL — ABNORMAL HIGH (ref 70–99)
Potassium: 4.6 mmol/L (ref 3.5–5.1)
Sodium: 140 mmol/L (ref 135–145)

## 2021-12-17 LAB — CBC
HCT: 24.6 % — ABNORMAL LOW (ref 39.0–52.0)
Hemoglobin: 7.3 g/dL — ABNORMAL LOW (ref 13.0–17.0)
MCH: 28.9 pg (ref 26.0–34.0)
MCHC: 29.7 g/dL — ABNORMAL LOW (ref 30.0–36.0)
MCV: 97.2 fL (ref 80.0–100.0)
Platelets: 388 10*3/uL (ref 150–400)
RBC: 2.53 MIL/uL — ABNORMAL LOW (ref 4.22–5.81)
RDW: 17.9 % — ABNORMAL HIGH (ref 11.5–15.5)
WBC: 7.1 10*3/uL (ref 4.0–10.5)
nRBC: 0.6 % — ABNORMAL HIGH (ref 0.0–0.2)

## 2021-12-17 LAB — GLUCOSE, CAPILLARY
Glucose-Capillary: 94 mg/dL (ref 70–99)
Glucose-Capillary: 93 mg/dL (ref 70–99)
Glucose-Capillary: 102 mg/dL — ABNORMAL HIGH (ref 70–99)
Glucose-Capillary: 124 mg/dL — ABNORMAL HIGH (ref 70–99)

## 2021-12-17 LAB — PREPARE RBC (CROSSMATCH)

## 2021-12-17 MED ORDER — SODIUM CHLORIDE 0.9 % IV SOLN: INTRAVENOUS | Status: DC

## 2021-12-17 MED ORDER — HYDROMORPHONE HCL 1 MG/ML IJ SOLN
0.5000 mg | INTRAMUSCULAR | Status: DC | PRN
  Administered 2021-12-21: 0.5 mg via INTRAVENOUS
  Filled 2021-12-17: qty 0.5

## 2021-12-17 MED ORDER — CEFAZOLIN SODIUM-DEXTROSE 2-4 GM/100ML-% IV SOLN: 2.0000 g | Freq: Three times a day (TID) | INTRAVENOUS | Status: DC

## 2021-12-17 MED ORDER — CEFAZOLIN SODIUM-DEXTROSE 2-4 GM/100ML-% IV SOLN
2.0000 g | Freq: Two times a day (BID) | INTRAVENOUS | Status: DC
  Administered 2021-12-17 – 2021-12-18 (×3): 2 g via INTRAVENOUS
  Filled 2021-12-17 (×3): qty 100

## 2021-12-17 MED ORDER — MIDODRINE HCL 5 MG PO TABS
10.0000 mg | ORAL_TABLET | Freq: Two times a day (BID) | ORAL | Status: DC
  Administered 2021-12-17 – 2021-12-18 (×3): 10 mg via ORAL
  Filled 2021-12-17 (×3): qty 2

## 2021-12-17 NOTE — Progress Notes (Addendum)
?Progress Note ? ? ? ?12/17/2021 ?7:39 AM ?1 Day Post-Op ? ?Subjective:  no complaints. Pain well controlled. Says overall just tired feeling ? ? ?Vitals:  ? 12/16/21 2311 12/17/21 0435  ?BP: (!) 92/47 (!) 92/59  ?Pulse: 60 95  ?Resp: 17 20  ?Temp:  97.6 ?F (36.4 ?C)  ?SpO2: 100% 100%  ? ?Physical Exam: ?Cardiac:  regular ?Lungs:  non labored ?Incisions:  right groin with wound VAC to suction. Left groin with Kerlix Gauze packing, ABD and 4x4s. Both with sanguinous drainage ?Extremities:  well perfused and warm with palpable DP pulses ?Abdomen:  obese, soft ?Neurologic: alert and oriented ? ?CBC ?   ?Component Value Date/Time  ? WBC 7.1 12/17/2021 0602  ? RBC 2.53 (L) 12/17/2021 0602  ? HGB 7.3 (L) 12/17/2021 0602  ? HGB 13.3 10/12/2021 1314  ? HCT 24.6 (L) 12/17/2021 0602  ? HCT 39.4 10/12/2021 1314  ? PLT 388 12/17/2021 0602  ? PLT 175 10/12/2021 1314  ? MCV 97.2 12/17/2021 0602  ? MCV 97 10/12/2021 1314  ? MCH 28.9 12/17/2021 0602  ? MCHC 29.7 (L) 12/17/2021 0602  ? RDW 17.9 (H) 12/17/2021 0602  ? RDW 13.1 10/12/2021 1314  ? LYMPHSABS 0.9 12/12/2021 1702  ? LYMPHSABS 1.9 10/12/2021 1314  ? MONOABS 0.7 12/12/2021 1702  ? EOSABS 0.4 12/12/2021 1702  ? EOSABS 0.7 (H) 10/12/2021 1314  ? BASOSABS 0.0 12/12/2021 1702  ? BASOSABS 0.1 10/12/2021 1314  ? ? ?BMET ?   ?Component Value Date/Time  ? NA 140 12/17/2021 0602  ? NA 145 (H) 10/12/2021 1314  ? K 4.6 12/17/2021 0602  ? CL 106 12/17/2021 0602  ? CO2 22 12/17/2021 0602  ? GLUCOSE 104 (H) 12/17/2021 0602  ? BUN 46 (H) 12/17/2021 0602  ? BUN 37 (H) 10/12/2021 1314  ? CREATININE 3.28 (H) 12/17/2021 0602  ? CALCIUM 9.1 12/17/2021 0602  ? GFRNONAA 20 (L) 12/17/2021 0602  ? GFRAA 56 (L) 10/26/2019 0551  ? ? ?INR ?   ?Component Value Date/Time  ? INR 1.2 12/13/2021 0002  ? ? ? ?Intake/Output Summary (Last 24 hours) at 12/17/2021 0739 ?Last data filed at 12/17/2021 [id number] ?Gross per 24 hour  ?Intake 2428.68 ml  ?Output 410 ml  ?Net 2018.68 ml  ? ? ? ?Assessment/Plan:  65 y.o.  male is s/p Incision and debridement of bilateral groin wounds including skin and subcutaneous tissue and fat. #2: Placement of bilateral wound vacs (left dimensions or 7 x 6 x 6 cm.  Right was 6 x 5 x 4 cm) 1 Day Post-Op  ? ?Right groin VAC in place. Sanguinous output 360 total over night ?Left groin dressings changed this morning. Sanguinous output. 4 x 4's and ABD applied. May try to get VAC reapplied later today or tomorrow ?Blood pressure soft ?Hgb 7.3. Will order 2 units PRBC ?BUN/ Cr up 46/3.28 Will increase fluids  ?Okay to mobilize today ? ? ?Karoline Caldwell, PA-C ?[facility] ?[phone] ?12/17/2021 ?7:39 AM ? ?I agree with the above.  I have seen and evaluated the patient.  He had bloody drainage from the left groin which required wound VAC removal and suture ligation of bleeding vessel.  The wound was then packed.  The packing remains in place and there is been no further bleeding.  We will plan on removing the packing over the weekend and replacing it. ? ?Acute blood loss anemia: The patient was transfused 2 units of blood ?Continue IV antibiotics and follow-up cultures. ?I  discussed with the patient that he is going to need further wound care possibly considering muscle flap once the wound is better prepared and more healthy appearing. ? ?Annamarie Major ?

## 2021-12-17 NOTE — Progress Notes (Signed)
Pharmacy Antibiotic Note ? ?Kevin Gross is a 66 y.o. male admitted on 12/12/2021 with L groin wound with persistent lymphatic drainage.  Culture now growing rare staph aureus that is pan-sensitive. Pharmacy has been asked to narrow vancomycin and zosyn to cefazolin.  Scr with bump today from 2.03 > 3.28. ? ?Plan: ?Cefazolin 2g IV q 12 hrs for now, will need to watch renal function carefully and adjust as necessary. ? ? ?Temp (24hrs), Avg:97.8 ?F (36.6 ?C), Min:97.6 ?F (36.4 ?C), Max:98.4 ?F (36.9 ?C) ? ?Recent Labs  ?Lab 12/13/21 ?0002 12/14/21 ?0303 12/15/21 ?0200 12/16/21 ?0239 12/16/21 ?1517 12/17/21 ?0602  ?WBC 6.5 4.6 7.1 6.3 6.8 7.1  ?CREATININE 2.30* 1.90* 1.85* 2.03*  --  3.28*  ? ?  ?Estimated Creatinine Clearance: 30.4 mL/min (A) (by C-G formula based on SCr of 3.28 mg/dL (H)).   ? ?No Known Allergies ? ?Antimicrobials this admission: ?Vanc 4/16 >> 4/21 ?Zosyn 4/16 >> 4/21 ?Cefazolin 4/21 > ? ?Dose adjustments this admission: ?N/A ? ?Microbiology results: ?4/17 L groin wound - few kleb oxy, mod e. Faecalis, few MSSA (final) ?4/17 r groin - no orgs; rare staph aureus (panS) ?  ? ?Thank you for allowing pharmacy to be a part of this patient?s care. ? ?[place] Crane, Pharm D, BCPS, BCCP ?Clinical Pharmacist ? 12/17/2021 8:02 AM  ? ?[facility] pharmacy phone numbers are listed on [email or web address] ? ? ? ?

## 2021-12-17 NOTE — Progress Notes (Addendum)
?PROGRESS NOTE ? ? ? ?LUISENRIQUE CONRAN  OZH:[id number] DOB: May 12, 1956 DOA: 12/12/2021 ?PCP: Rutherford Limerick, PA  ?NarrativeAnthony JASMINE MACEACHERN is a 66 y.o. male with past medical history of alcoholic cirrhosis; COPD; chronic diastolic CHF; HLD; HTN; pre-DM; morbid obesity; transient afib; and OSA on CPAP presenting with wound infection following bifemoral endarterectomy on 3/31.  He reports drainage from L > R groin recently.  He has had some bleeding and edema, including scrotal edema.  ?-Diagnosed with infected left groin lymphocele, admitted by vascular surgery, TRH consulted for medical management ?-4/18 underwent surgical debridement and wound VAC placement ?-Back to the OR 4/20 for repeat debridement, wound VAC placement ?-Overnight 4/20-21, bleeding from the surgical site, blood pressures in the 90s ?-4/21 creatinine up to 3.2, hemoglobin 7.3 ? ? ?Subjective: ?-Feels okay overall, had repeat debridement yesterday, overnight with bleeding from the groin site, blood pressures in the 90s ? ?Assessment and Plan: ? ?Infected lymphocele following femoral endarterectomy ?-L groin wound with dehiscence and foul-smelling drainage ?-Per vascular surgery, underwent wound washout, debridement, antibiotic bead placement and wound VAC placement 4/18 ?-Repeat debridement 4/20 with wound VAC placement ?-Culture is polymicrobial, MSSA, Klebsiella, Enterococcus faecalis ?-Change antibiotics to IV Ancef and ampicillin based on above cultures ?-Continued on aspirin, Plavix for complicated PAD ? ?Volume overload ?Alcoholic cirrhosis of liver without ascites ([place]) ?-Mild LFT elevation ?-Quit drinking EtOH few years ago ?-On oral chlorthalidone at baseline, diuresed with IV Lasix 4/19, started oral torsemide 4/20 ?-Echo with preserved EF, indeterminate diastolic parameters 6/29 ?-Volume status has improved however creatinine has trended up to 3.2 today from baseline around 2, likely secondary to hypotension overnight, vancomycin,   recent diuresis etc. ?-Hold off on further diuretics today, getting PRBC today ? ?Iron deficiency anemia ?-Suspect component of blood loss and hemodilution too ?-Anemia panel with iron deficiency, given IV iron ?-Continue PPI, ?-VVS transfusing 2 units of PRBC today ? ?AKI on chronic kidney disease, stage 3b ([place]) ?-Mild AKI in the setting of stage 3b CKD ?-Baseline creatinine around 1.6-2,  ?-Lisinopril and chlorthalidone on hold ?-Creatinine trended up to 3.2 today from 2 yesterday, likely secondary to overnight hypotension, blood loss, recent diuresis, getting PRBC today, hold diuretics today, discontinued vancomycin and changed to IV Ancef, also check bladder scan ?-Avoid hypotension, add midodrine ? ?PAF (paroxysmal atrial fibrillation) ([place]) ?-Had transient A-fib during recent hospitalization, left on dual antiplatelet therapy on account of extensive peripheral vascular procedure with stents, cardiology note on 4/10 recommended dual antiplatelet therapy for 4 weeks after which DOAC could be considered if 1 antiplatelet agent can be stopped, now in sinus rhythm, sinus bradycardia ? ?Essential hypertension ?-BP stable, atenolol held for bradycardia and chlorthalidone/lisinopril for AKI ?-Add midodrine for soft, low BPs ? ?OSA (obstructive sleep apnea) ?-Continue CPAP ? ?Pre-diabetes ?-Recent A1c was 5.6  ?-CBGs are stable ? ?PAD (peripheral artery disease) ([place]) ?-Recent bifemoral procedure ?-Vascular surgery is managing ?-Continue ASA and Plavix ? ?Morbid obesity ([place]) ?-Body mass index is 44.85 kg/m?. ?-Weight loss should be encouraged ?-Outpatient PCP/bariatric medicine f/u encouraged ? ?Mixed hyperlipidemia ?-Continue Zocor, Zetia ? ?COPD (chronic obstructive pulmonary disease) ([place]) ?-stab;e ? ?DVT prophylaxis: Lovenox ?Code Status: Full code ? ? ?Procedures: Bilateral groin wound washout debridement, antibiotic bead placement, VAC placement by Dr. Unk Lightning 4/18 ? ?Antimicrobials:  ? ? ?Objective: ?Vitals:   ? 12/16/21 2228 12/16/21 2311 12/17/21 0435 12/17/21 0855  ?BP:  (!) 92/47 (!) 92/59 123/68  ?Pulse: 68 60 95 75  ?Resp: '14 17 20 18  '$ ?  Temp:   97.6 ?F (36.4 ?C) 98.4 ?F (36.9 ?C)  ?TempSrc:   Oral Oral  ?SpO2: 100% 100% 100% 99%  ?Weight:      ?Height:      ? ? ?Intake/Output Summary (Last 24 hours) at 12/17/2021 1045 ?Last data filed at 12/17/2021 0911 ?Gross per 24 hour  ?Intake 2428.68 ml  ?Output 906 ml  ?Net 1522.68 ml  ? ?Filed Weights  ? 12/13/21 1108  ?Weight: 133.8 kg  ? ? ?Examination: ? ?General exam: Obese chronically ill male sitting up in bed, AAOx3, no distress ?HEENT: No JVD ?CVS: S1-S2, regular rhythm ?Lungs: Decreased breath sounds at the bases ?Abdomen: Soft, mildly distended, lateral abdominal wall edema ?Groin wound with wound vac ?Extremities: Trace edema  ?Psychiatry:  Mood & affect appropriate.  ? ? ? ?Data Reviewed:  ? ?CBC: ?Recent Labs  ?Lab 12/12/21 ?1702 12/12/21 ?1715 12/14/21 ?0303 12/15/21 ?0200 12/16/21 ?0239 12/16/21 ?1517 12/17/21 ?0602  ?WBC 6.7   < > 4.6 7.1 6.3 6.8 7.1  ?NEUTROABS 4.6  --   --   --   --   --   --   ?HGB 8.6*   < > 7.9* 7.1* 7.6* 7.3* 7.3*  ?HCT 28.8*   < > 25.6* 22.7* 24.3* 23.6* 24.6*  ?MCV 97.6   < > 95.9 95.4 94.9 95.2 97.2  ?PLT 496*   < > 407* 376 417* 426* 388  ? < > = values in this interval not displayed.  ? ?Basic Metabolic Panel: ?Recent Labs  ?Lab 12/13/21 ?0002 12/14/21 ?0303 12/15/21 ?0200 12/16/21 ?0239 12/17/21 ?0602  ?NA 137 137 140 139 140  ?K 4.4 5.6* 5.0 4.4 4.6  ?CL 106 106 113* 103 106  ?CO2 21* '23 24 26 22  '$ ?GLUCOSE 99 139* 109* 92 104*  ?BUN 48* 36* 34* 38* 46*  ?CREATININE 2.30* 1.90* 1.85* 2.03* 3.28*  ?CALCIUM 8.5* 8.5* 8.3* 9.4 9.1  ? ?GFR: ?Estimated Creatinine Clearance: 30.4 mL/min (A) (by C-G formula based on SCr of 3.28 mg/dL (H)). ?Liver Function Tests: ?Recent Labs  ?Lab 12/12/21 ?1702 12/13/21 ?0002  ?AST 69* 52*  ?ALT 62* 53*  ?ALKPHOS 57 56  ?BILITOT 1.2 0.8  ?PROT 6.6 6.5  ?ALBUMIN 2.5* 2.5*  ? ?No results for input(s):  LIPASE, AMYLASE in the last 168 hours. ?No results for input(s): AMMONIA in the last 168 hours. ?Coagulation Profile: ?Recent Labs  ?Lab 12/13/21 ?0002  ?INR 1.2  ? ?Cardiac Enzymes: ?No results for input(s): CKTOTAL, CKMB, CKMBINDEX, TROPONINI in the last 168 hours. ?BNP (last 3 results) ?No results for input(s): PROBNP in the last 8760 hours. ?HbA1C: ?No results for input(s): HGBA1C in the last 72 hours. ?CBG: ?Recent Labs  ?Lab 12/16/21 ?5885 12/16/21 ?1403 12/16/21 ?1610 12/16/21 ?2133 12/17/21 ?0617  ?GLUCAP 79 101* 107* 131* 94  ? ?Lipid Profile: ?No results for input(s): CHOL, HDL, LDLCALC, TRIG, CHOLHDL, LDLDIRECT in the last 72 hours. ?Thyroid Function Tests: ?No results for input(s): TSH, T4TOTAL, FREET4, T3FREE, THYROIDAB in the last 72 hours. ?Anemia Panel: ?Recent Labs  ?  12/15/21 ?0242 12/15/21 ?0800  ?[id number]  --  556  ?FOLATE  --  13.7  ?FERRITIN  --  616*  ?TIBC  --  245*  ?IRON  --  32*  ?RETICCTPCT 3.8*  --   ? ?Urine analysis: ?   ?Component Value Date/Time  ? [place] YELLOW 12/13/2021 0705  ? APPEARANCEUR CLEAR 12/13/2021 0705  ? LABSPEC 1.015 12/13/2021 0705  ? PHURINE 5.0 12/13/2021 0705  ?  GLUCOSEU NEGATIVE 12/13/2021 0705  ? [place] NEGATIVE 12/13/2021 0705  ? [place] NEGATIVE 12/13/2021 0705  ? [place] NEGATIVE 12/13/2021 0705  ? [place] NEGATIVE 12/13/2021 0705  ? NITRITE NEGATIVE 12/13/2021 0705  ? LEUKOCYTESUR NEGATIVE 12/13/2021 0705  ? ?Sepsis Labs: ?'@LABRCNTIP'$ (procalcitonin:4,lacticidven:4) ? ?) ?Recent Results (from the past 240 hour(s))  ?Aerobic Culture w Gram Stain (superficial specimen)     Status: None  ? Collection Time: 12/13/21 12:02 AM  ? Specimen: Wound  ?Result Value Ref Range Status  ? Specimen Description WOUND LEFT GROIN  Final  ? Special Requests NONE  Final  ? Gram Stain   Final  ?  FEW WBC PRESENT, PREDOMINANTLY PMN ?FEW GRAM POSITIVE COCCI IN CLUSTERS ?RARE GRAM POSITIVE RODS ?Performed at [facility], [place] [address] [place], [place] [address] ?   ? Culture   Final  ?  FEW KLEBSIELLA OXYTOCA ?FEW STAPHYLOCOCCUS AUREUS ?MODERATE ENTEROCOCCUS FAECALIS ?  ? Report Status 12/16/2021 FINAL  Final  ? Organism ID, Bacteria KLEBSIELLA OXYTOCA  Final  ? Organism

## 2021-12-18 DIAGNOSIS — I898 Other specified noninfective disorders of lymphatic vessels and lymph nodes: Secondary | ICD-10-CM | POA: Diagnosis not present

## 2021-12-18 DIAGNOSIS — T8189XA Other complications of procedures, not elsewhere classified, initial encounter: Secondary | ICD-10-CM | POA: Diagnosis not present

## 2021-12-18 LAB — TYPE AND SCREEN
ABO/RH(D): A POS
Antibody Screen: NEGATIVE
Unit division: 0
Unit division: 0

## 2021-12-18 LAB — CBC
HCT: 27.3 % — ABNORMAL LOW (ref 39.0–52.0)
Hemoglobin: 8.8 g/dL — ABNORMAL LOW (ref 13.0–17.0)
MCH: 30.3 pg (ref 26.0–34.0)
MCHC: 32.2 g/dL (ref 30.0–36.0)
MCV: 94.1 fL (ref 80.0–100.0)
Platelets: 367 10*3/uL (ref 150–400)
RBC: 2.9 MIL/uL — ABNORMAL LOW (ref 4.22–5.81)
RDW: 17.9 % — ABNORMAL HIGH (ref 11.5–15.5)
WBC: 7.3 10*3/uL (ref 4.0–10.5)
nRBC: 1 % — ABNORMAL HIGH (ref 0.0–0.2)

## 2021-12-18 LAB — GLUCOSE, CAPILLARY
Glucose-Capillary: 91 mg/dL (ref 70–99)
Glucose-Capillary: 90 mg/dL (ref 70–99)
Glucose-Capillary: 80 mg/dL (ref 70–99)
Glucose-Capillary: 109 mg/dL — ABNORMAL HIGH (ref 70–99)

## 2021-12-18 LAB — COMPREHENSIVE METABOLIC PANEL
ALT: 43 U/L (ref 0–44)
AST: 36 U/L (ref 15–41)
Albumin: 2.9 g/dL — ABNORMAL LOW (ref 3.5–5.0)
Alkaline Phosphatase: 44 U/L (ref 38–126)
Anion gap: 9 (ref 5–15)
BUN: 46 mg/dL — ABNORMAL HIGH (ref 8–23)
CO2: 24 mmol/L (ref 22–32)
Calcium: 8.8 mg/dL — ABNORMAL LOW (ref 8.9–10.3)
Chloride: 105 mmol/L (ref 98–111)
Creatinine, Ser: 2.95 mg/dL — ABNORMAL HIGH (ref 0.61–1.24)
GFR, Estimated: 23 mL/min — ABNORMAL LOW (ref >60)
Glucose, Bld: 106 mg/dL — ABNORMAL HIGH (ref 70–99)
Potassium: 4.1 mmol/L (ref 3.5–5.1)
Sodium: 138 mmol/L (ref 135–145)
Total Bilirubin: 0.9 mg/dL (ref 0.3–1.2)
Total Protein: 5.9 g/dL — ABNORMAL LOW (ref 6.5–8.1)

## 2021-12-18 LAB — AEROBIC/ANAEROBIC CULTURE W GRAM STAIN (SURGICAL/DEEP WOUND): Gram Stain: NONE SEEN

## 2021-12-18 LAB — BPAM RBC
Blood Product Expiration Date: 202305092359
Blood Product Expiration Date: 202305092359
ISSUE DATE / TIME: 202304211031
ISSUE DATE / TIME: 202304211523
Unit Type and Rh: 6200
Unit Type and Rh: 6200

## 2021-12-18 MED ORDER — CEFAZOLIN SODIUM-DEXTROSE 2-4 GM/100ML-% IV SOLN
2.0000 g | Freq: Three times a day (TID) | INTRAVENOUS | Status: DC
  Administered 2021-12-18 – 2021-12-24 (×18): 2 g via INTRAVENOUS
  Filled 2021-12-18 (×19): qty 100

## 2021-12-18 NOTE — Progress Notes (Addendum)
?  Progress Note ? ? ? ?12/18/2021 ?11:40 AM ?2 Days Post-Op ? ?Subjective:  no complaints ? ? ?Vitals:  ? 12/18/21 0900 12/18/21 1000  ?BP:    ?Pulse: 77 62  ?Resp: 11 10  ?Temp:    ?SpO2: 100% 96%  ? ?Physical Exam: ?Cardiac:  regular ?Lungs:  non labored ?Incisions:  Right groin with VAC to suction. Left groin dressings c/d/i ?Extremities:  well perfused and warm ?Abdomen:  obese, soft ?Neurologic: alert and oriented ? ?CBC ?   ?Component Value Date/Time  ? WBC 7.3 12/18/2021 0133  ? RBC 2.90 (L) 12/18/2021 0133  ? HGB 8.8 (L) 12/18/2021 0133  ? HGB 13.3 10/12/2021 1314  ? HCT 27.3 (L) 12/18/2021 0133  ? HCT 39.4 10/12/2021 1314  ? PLT 367 12/18/2021 0133  ? PLT 175 10/12/2021 1314  ? MCV 94.1 12/18/2021 0133  ? MCV 97 10/12/2021 1314  ? MCH 30.3 12/18/2021 0133  ? MCHC 32.2 12/18/2021 0133  ? RDW 17.9 (H) 12/18/2021 0133  ? RDW 13.1 10/12/2021 1314  ? LYMPHSABS 0.9 12/12/2021 1702  ? LYMPHSABS 1.9 10/12/2021 1314  ? MONOABS 0.7 12/12/2021 1702  ? EOSABS 0.4 12/12/2021 1702  ? EOSABS 0.7 (H) 10/12/2021 1314  ? BASOSABS 0.0 12/12/2021 1702  ? BASOSABS 0.1 10/12/2021 1314  ? ? ?BMET ?   ?Component Value Date/Time  ? NA 138 12/18/2021 0133  ? NA 145 (H) 10/12/2021 1314  ? K 4.1 12/18/2021 0133  ? CL 105 12/18/2021 0133  ? CO2 24 12/18/2021 0133  ? GLUCOSE 106 (H) 12/18/2021 0133  ? BUN 46 (H) 12/18/2021 0133  ? BUN 37 (H) 10/12/2021 1314  ? CREATININE 2.95 (H) 12/18/2021 0133  ? CALCIUM 8.8 (L) 12/18/2021 0133  ? GFRNONAA 23 (L) 12/18/2021 0133  ? GFRAA 56 (L) 10/26/2019 0551  ? ? ?INR ?   ?Component Value Date/Time  ? INR 1.2 12/13/2021 0002  ? ? ? ?Intake/Output Summary (Last 24 hours) at 12/18/2021 1140 ?Last data filed at 12/18/2021 4492 ?Gross per 24 hour  ?Intake 988 ml  ?Output 1400 ml  ?Net -412 ml  ? ? ? ?Assessment/Plan:  66 y.o. male is s/p  Incision and debridement of bilateral groin wounds including skin and subcutaneous tissue and fat. #2: Placement of bilateral wound vacs (left dimensions or 7 x 6 x 6 cm.   Right was 6 x 5 x 4 cm) 2 Days Post-Op  ? ?Right groin with VAC to suction. Will change tomorrow ?L groin packing will change tomorrow. Remains hemostatic ?H&H stable post transfusion yesterday ?Scr improved slightly today ?On ABX- adjusted following cultures. Continue Ancef and Amp ?Appreciate TRH assistance ?Okay to mobilize ? ?Karoline Caldwell, PA-C ?[facility] ?[phone] ?12/18/2021 ?11:40 AM ? ?VASCULAR STAFF ADDENDUM: ?I have independently interviewed and examined the patient. ?I agree with the above.  ?Plan to change to left groin packing tomorrow given difficulty with bleeding.  May need flap closure of the left groin in the future.  We will continue dressing changes for now. ? ?Yevonne Aline. Stanford Breed, MD ?[facility] [place] ?Office Phone Number: [phone] ?12/18/2021 12:54 PM ? ? ?

## 2021-12-18 NOTE — Progress Notes (Signed)
PHARMACY NOTE:  ANTIMICROBIAL RENAL DOSAGE ADJUSTMENT ? ?Current antimicrobial regimen includes a mismatch between antimicrobial dosage and estimated renal function.  As per policy approved by the Pharmacy & Therapeutics and Medical Executive Committees, the antimicrobial dosage will be adjusted accordingly. ? ?Current antimicrobial dosage:  cefazolin 2g q12h ? ?Indication: infected lymphocele left groin ? ?Renal Function: CrCl 33 ? ?Estimated Creatinine Clearance: 33.7 mL/min (A) (by C-G formula based on SCr of 2.95 mg/dL (H)). ?'[]'$      On intermittent HD, scheduled: ?'[]'$      On CRRT ?   ?Antimicrobial dosage has been changed to:  cefazolin 2g q8h ? ?Additional comments: ? ? ?Thank you for allowing pharmacy to be a part of this patient's care. ? ?Aleene Davidson, RPH ?12/18/2021 11:45 AM ?

## 2021-12-18 NOTE — Consult Note (Signed)
?CONSULT  NOTE ? ? ? ?KAITLYN SKOWRON  NGE:[id number] DOB: 23-Nov-1955 DOA: 12/12/2021 ?PCP: Rutherford Limerick, PA  ?NarrativeAnthony PRINCESTON BLIZZARD is a 66 y.o. male with past medical history of alcoholic cirrhosis; COPD; chronic diastolic CHF; HLD; HTN; pre-DM; morbid obesity; transient afib; and OSA on CPAP presenting with wound infection following bifemoral endarterectomy on 3/31.  He reports drainage from L > R groin recently.  He has had some bleeding and edema, including scrotal edema.  ?-Diagnosed with infected left groin lymphocele, admitted by vascular surgery, TRH consulted for medical management ?-4/18 underwent surgical debridement and wound VAC placement ?-Back to the OR 4/20 for repeat debridement, wound VAC placement ?-Overnight 4/20-21, bleeding from the surgical site, blood pressures in the 90s ?-4/21 creatinine up to 3.2, hemoglobin 7.3 ?- 4/22 creatinine improved to 2.95, hgb stable ? ? ?Subjective: ?-pain controlled, tolerating diet, had bm yesterday, says making good urine ? ?Assessment and Plan: ? ?Infected lymphocele following femoral endarterectomy ?-L groin wound with dehiscence and foul-smelling drainage ?-Per vascular surgery, underwent wound washout, debridement, antibiotic bead placement and wound VAC placement 4/18 ?-Repeat debridement 4/20 with wound VAC placement ?-Culture is polymicrobial, MSSA, Klebsiella, Enterococcus faecalis ?-abx per primary team: On IV cefazolin based on above cultures ?-Continued on aspirin, Plavix for complicated PAD ? ?Volume overload ?Alcoholic cirrhosis of liver without ascites ([place]) ?-Mild LFT elevation ?-Quit drinking EtOH few years ago ?-On oral chlorthalidone at baseline, diuresed with IV Lasix 4/19, started oral torsemide 4/20 ?-Echo with preserved EF, indeterminate diastolic parameters 4/01 ?-Volume status improved however creatinine trended up to 3.2 4/21 from baseline around 2, likely secondary to hypotension, vanc, diuresis ?-diuretics on hold, cr improved  to 2.95 today ? ?Iron deficiency anemia ?-Suspect component of blood loss and hemodilution too ?-Anemia panel with iron deficiency, given IV iron ?- transfused 2 units 4/21. Hgb 8.8 today from low 7s prior to transfusion ?-Continue PPI, ? ?AKI on chronic kidney disease, stage 3b ([place]) ?-Mild AKI in the setting of stage 3b CKD ?-Baseline creatinine around 1.6-2,  ?-Lisinopril and chlorthalidone on hold ?-Creatinine trended up to 3.2 yesterday from 2 prior, likely secondary to overnight hypotension, blood loss, recent diuresis,  ?- improved to 2.95 today ? ?PAF (paroxysmal atrial fibrillation) ([place]) ?-Had transient A-fib during recent hospitalization, left on dual antiplatelet therapy on account of extensive peripheral vascular procedure with stents, cardiology note on 4/10 recommended dual antiplatelet therapy for 4 weeks after which DOAC could be considered if 1 antiplatelet agent can be stopped, now in sinus rhythm, sinus bradycardia ? ?Essential hypertension ?-BP wnl today off home meds ?- will d/c midodrine started yesterday ? ?OSA (obstructive sleep apnea) ?-Continue CPAP qhs ? ?Pre-diabetes ?-Recent A1c was 5.6  ?-CBGs are stable ? ?PAD (peripheral artery disease) ([place]) ?-Recent bifemoral procedure ?-Vascular surgery is managing ?-Continue ASA and Plavix ? ?Morbid obesity ([place]) ?-Body mass index is 44.85 kg/m?. ?-Weight loss should be encouraged ?-Outpatient PCP/bariatric medicine f/u encouraged ? ?Mixed hyperlipidemia ?-Continue Zocor, Zetia ? ?COPD (chronic obstructive pulmonary disease) ([place]) ?-stab;e ? ?DVT prophylaxis: Lovenox ?Code Status: Full code ? ? ?Procedures: Bilateral groin wound washout debridement, antibiotic bead placement, VAC placement by Dr. Unk Lightning 4/18 ? ?Antimicrobials:  ? ? ?Objective: ?Vitals:  ? 12/18/21 0736 12/18/21 0800 12/18/21 0900 12/18/21 1000  ?BP: 137/66     ?Pulse: 68 70 77 62  ?Resp: '18 14 11 10  '$ ?Temp: 98.5 ?F (36.9 ?C)     ?TempSrc: Oral     ?SpO2: 100% 98% 100%  96%   ?Weight:      ?Height:      ? ? ?Intake/Output Summary (Last 24 hours) at 12/18/2021 1100 ?Last data filed at 12/18/2021 4235 ?Gross per 24 hour  ?Intake 988 ml  ?Output 1400 ml  ?Net -412 ml  ? ?Filed Weights  ? 12/13/21 1108 12/18/21 0342  ?Weight: 133.8 kg 133.1 kg  ? ? ?Examination: ? ?General exam: Obese chronically ill male sitting up in bed, AAOx3, no distress ?HEENT: No JVD ?CVS: S1-S2, regular rhythm ?Lungs: Decreased breath sounds at the bases ?Abdomen: Soft, mildly distended, lateral abdominal wall edema ?Groin wound with wound vac on right, dressing on left ?Extremities: Trace edema  ?Psychiatry:  Mood & affect appropriate.  ? ? ? ?Data Reviewed:  ? ?CBC: ?Recent Labs  ?Lab 12/12/21 ?1702 12/12/21 ?1715 12/15/21 ?0200 12/16/21 ?0239 12/16/21 ?1517 12/17/21 ?0602 12/18/21 ?0133  ?WBC 6.7   < > 7.1 6.3 6.8 7.1 7.3  ?NEUTROABS 4.6  --   --   --   --   --   --   ?HGB 8.6*   < > 7.1* 7.6* 7.3* 7.3* 8.8*  ?HCT 28.8*   < > 22.7* 24.3* 23.6* 24.6* 27.3*  ?MCV 97.6   < > 95.4 94.9 95.2 97.2 94.1  ?PLT 496*   < > 376 417* 426* 388 367  ? < > = values in this interval not displayed.  ? ?Basic Metabolic Panel: ?Recent Labs  ?Lab 12/14/21 ?0303 12/15/21 ?0200 12/16/21 ?0239 12/17/21 ?0602 12/18/21 ?0133  ?NA 137 140 139 140 138  ?K 5.6* 5.0 4.4 4.6 4.1  ?CL 106 113* 103 106 105  ?CO2 '23 24 26 22 24  '$ ?GLUCOSE 139* 109* 92 104* 106*  ?BUN 36* 34* 38* 46* 46*  ?CREATININE 1.90* 1.85* 2.03* 3.28* 2.95*  ?CALCIUM 8.5* 8.3* 9.4 9.1 8.8*  ? ?GFR: ?Estimated Creatinine Clearance: 33.7 mL/min (A) (by C-G formula based on SCr of 2.95 mg/dL (H)). ?Liver Function Tests: ?Recent Labs  ?Lab 12/12/21 ?1702 12/13/21 ?0002 12/18/21 ?0133  ?AST 69* 52* 36  ?ALT 62* 53* 43  ?ALKPHOS 57 56 44  ?BILITOT 1.2 0.8 0.9  ?PROT 6.6 6.5 5.9*  ?ALBUMIN 2.5* 2.5* 2.9*  ? ?No results for input(s): LIPASE, AMYLASE in the last 168 hours. ?No results for input(s): AMMONIA in the last 168 hours. ?Coagulation Profile: ?Recent Labs  ?Lab 12/13/21 ?0002   ?INR 1.2  ? ?Cardiac Enzymes: ?No results for input(s): CKTOTAL, CKMB, CKMBINDEX, TROPONINI in the last 168 hours. ?BNP (last 3 results) ?No results for input(s): PROBNP in the last 8760 hours. ?HbA1C: ?No results for input(s): HGBA1C in the last 72 hours. ?CBG: ?Recent Labs  ?Lab 12/17/21 ?0617 12/17/21 ?1120 12/17/21 ?1656 12/17/21 ?2121 12/18/21 ?3614  ?GLUCAP 94 93 102* 124* 91  ? ?Lipid Profile: ?No results for input(s): CHOL, HDL, LDLCALC, TRIG, CHOLHDL, LDLDIRECT in the last 72 hours. ?Thyroid Function Tests: ?No results for input(s): TSH, T4TOTAL, FREET4, T3FREE, THYROIDAB in the last 72 hours. ?Anemia Panel: ?No results for input(s): VITAMINB12, FOLATE, FERRITIN, TIBC, IRON, RETICCTPCT in the last 72 hours. ? ?Urine analysis: ?   ?Component Value Date/Time  ? [place] YELLOW 12/13/2021 0705  ? APPEARANCEUR CLEAR 12/13/2021 0705  ? LABSPEC 1.015 12/13/2021 0705  ? PHURINE 5.0 12/13/2021 0705  ? GLUCOSEU NEGATIVE 12/13/2021 0705  ? [place] NEGATIVE 12/13/2021 0705  ? [place] NEGATIVE 12/13/2021 0705  ? [place] NEGATIVE 12/13/2021 0705  ? [place] NEGATIVE 12/13/2021 0705  ? NITRITE NEGATIVE 12/13/2021 0705  ?  LEUKOCYTESUR NEGATIVE 12/13/2021 0705  ? ?Sepsis Labs: ?'@LABRCNTIP'$ (procalcitonin:4,lacticidven:4) ? ?) ?Recent Results (from the past 240 hour(s))  ?Aerobic Culture w Gram Stain (superficial specimen)     Status: None  ? Collection Time: 12/13/21 12:02 AM  ? Specimen: Wound  ?Result Value Ref Range Status  ? Specimen Description WOUND LEFT GROIN  Final  ? Special Requests NONE  Final  ? Gram Stain   Final  ?  FEW WBC PRESENT, PREDOMINANTLY PMN ?FEW GRAM POSITIVE COCCI IN CLUSTERS ?RARE GRAM POSITIVE RODS ?Performed at [facility], [place] [address], [place], [place] [address] ?  ? Culture   Final  ?  FEW KLEBSIELLA OXYTOCA ?FEW STAPHYLOCOCCUS AUREUS ?MODERATE ENTEROCOCCUS FAECALIS ?  ? Report Status 12/16/2021 FINAL  Final  ? Organism ID, Bacteria KLEBSIELLA OXYTOCA  Final  ? Organism ID,  Bacteria ENTEROCOCCUS FAECALIS  Final  ? Organism ID, Bacteria STAPHYLOCOCCUS AUREUS  Final  ?    Susceptibility  ? Enterococcus faecalis - MIC*  ?  AMPICILLIN <=2 SENSITIVE Sensitive   ?  VANCOMYCIN 1 SENSITIV

## 2021-12-19 DIAGNOSIS — T8149XA Infection following a procedure, other surgical site, initial encounter: Secondary | ICD-10-CM

## 2021-12-19 DIAGNOSIS — N179 Acute kidney failure, unspecified: Secondary | ICD-10-CM | POA: Diagnosis not present

## 2021-12-19 DIAGNOSIS — I739 Peripheral vascular disease, unspecified: Secondary | ICD-10-CM

## 2021-12-19 DIAGNOSIS — N189 Chronic kidney disease, unspecified: Secondary | ICD-10-CM

## 2021-12-19 DIAGNOSIS — I10 Essential (primary) hypertension: Secondary | ICD-10-CM

## 2021-12-19 DIAGNOSIS — I480 Paroxysmal atrial fibrillation: Secondary | ICD-10-CM

## 2021-12-19 DIAGNOSIS — J432 Centrilobular emphysema: Secondary | ICD-10-CM

## 2021-12-19 DIAGNOSIS — R7303 Prediabetes: Secondary | ICD-10-CM

## 2021-12-19 DIAGNOSIS — D649 Anemia, unspecified: Secondary | ICD-10-CM | POA: Diagnosis present

## 2021-12-19 LAB — BASIC METABOLIC PANEL
Anion gap: 9 (ref 5–15)
BUN: 37 mg/dL — ABNORMAL HIGH (ref 8–23)
CO2: 24 mmol/L (ref 22–32)
Calcium: 9.4 mg/dL (ref 8.9–10.3)
Chloride: 106 mmol/L (ref 98–111)
Creatinine, Ser: 2.28 mg/dL — ABNORMAL HIGH (ref 0.61–1.24)
GFR, Estimated: 31 mL/min — ABNORMAL LOW (ref >60)
Glucose, Bld: 105 mg/dL — ABNORMAL HIGH (ref 70–99)
Potassium: 4.3 mmol/L (ref 3.5–5.1)
Sodium: 139 mmol/L (ref 135–145)

## 2021-12-19 LAB — GLUCOSE, CAPILLARY
Glucose-Capillary: 103 mg/dL — ABNORMAL HIGH (ref 70–99)
Glucose-Capillary: 114 mg/dL — ABNORMAL HIGH (ref 70–99)
Glucose-Capillary: 112 mg/dL — ABNORMAL HIGH (ref 70–99)
Glucose-Capillary: 119 mg/dL — ABNORMAL HIGH (ref 70–99)

## 2021-12-19 LAB — CBC
HCT: 28 % — ABNORMAL LOW (ref 39.0–52.0)
Hemoglobin: 8.6 g/dL — ABNORMAL LOW (ref 13.0–17.0)
MCH: 29.9 pg (ref 26.0–34.0)
MCHC: 30.7 g/dL (ref 30.0–36.0)
MCV: 97.2 fL (ref 80.0–100.0)
Platelets: 334 10*3/uL (ref 150–400)
RBC: 2.88 MIL/uL — ABNORMAL LOW (ref 4.22–5.81)
RDW: 18.9 % — ABNORMAL HIGH (ref 11.5–15.5)
WBC: 7.3 10*3/uL (ref 4.0–10.5)
nRBC: 0.3 % — ABNORMAL HIGH (ref 0.0–0.2)

## 2021-12-19 NOTE — Progress Notes (Signed)
?Progress Note ? ? ?Patient: Kevin Gross TMA:[id number] DOB: Dec 14, 1955 DOA: 12/12/2021     7 ?DOS: the patient was seen and examined on 12/19/2021 ?  ?Brief hospital course: ?Medical consultation.  ? ?Kevin Gross, 66 yo male with the past medical history of alcohol related cirrhosis, COPD, heart failure, dyslipidemia, obesity and hypertension developed wound infection following bifemoral endarterectomy on 03/31. He reported drainage from groin wounds more left than right, along with local bleeding and edema.  ?Admitted to the hospital on 12/12/21 with the working diagnosis of infected post surgical lymphocele bilateral groins.  ?Patient was placed on antibiotic therapy. ?Underwent wound washout on 04/18 and I&D on 04/20   ? ?Wound culture positive for MSSA, Klebseila and enterococcus fecalis.   ? ?04/21 PRBC transfused x2 for worsening anemia.  ? ? ?Assessment and Plan: ?* Wound infection after surgery ?Infected lymphocele of bilateral groins. ?04/18 bilateral groin wound washout debridement, antibiotic bead placement and VAC placement. ?04/20 I&D of bilateral groin wounds including skin and subcutaneous tissue fat.  ?Placement of bilateral wound vacs.  ? ?Culture positive for klebsiella, enterococcus and staphylococcus (MSSA).  ?Patient has been afebrile. Wbc 7.3  ? ?Groin pain is well controlled with analgesics ? ?Plan to continue antibiotic therapy with cefazolin. ?Continue wound vac to right groin and wet to dry to the left groin.   ?Out of bed to chair tid with meals.  ? ? ? ?Acute kidney injury superimposed on chronic kidney disease ([place]) ?Renal function with serum cr at 2.28 with K at 4,3 and serum bicarbonate at 24.  ?Patient with no signs of hypervolemia.  ? ?Continue close follow up on renal function and electrolytes.  ? ?Pre-diabetes ?-Recent A1c was 5.6  ?Continue glucose cover and monitoring with insulin sliding scale.  ? ?Dyslipidemia, continue with statin therapy.  ? ?Essential hypertension ?Blood  pressure has been stable with systolic 256 to 389 mmHg.  ?Continue close monitoring.  ? ?COPD (chronic obstructive pulmonary disease) ([place]) ?No acute exacerbation.  ? ? ?PAD (peripheral artery disease) ([place]) ?Continue with aspirin and clopidogrel ?Patient sp revascularization  ? ?PAF (paroxysmal atrial fibrillation) ([place]) ?Continue rate control with amiodarone. ?Currently off anticoagulation. ?On aspirin and clopidogrel.  ? ?Alcoholic cirrhosis of liver without ascites ([place]) ?No clinical signs of withdrawal. ?Continue close neuro checks, out of bed to chair tid with meals.  ?Nutrition consult.  ? ?OSA (obstructive sleep apnea) ?-Continue CPAP ? ?Anemia ?Post operative anemia on anemia of chronic disease.  ?SP 2 units PRBC transfusion with good toleration. ?Follow up hgb is 8.6  ? ?Class 3 obesity ([facility]) ?-Body mass index is 44.85 kg/m?. ? ? ? ? ? ?  ? ?Subjective: Patient is feeling better, no nausea or vomiting, no dyspnea or chest pain.  ? ?Physical Exam: ?Vitals:  ? 12/18/21 2314 12/19/21 0328 12/19/21 0748 12/19/21 1220  ?BP: 119/60 103/79 (!) 145/64 109/68  ?Pulse: 68 75  100  ?Resp: 11 19 (!) 21 20  ?Temp: (!) 97.5 ?F (36.4 ?C) 98.8 ?F (37.1 ?C) 98.2 ?F (36.8 ?C) 98 ?F (36.7 ?C)  ?TempSrc: Oral Axillary Oral Oral  ?SpO2: 99% 97% 100% 100%  ?Weight:      ?Height:      ? ?Neurology awake and alert ?ENT with mild pallor ?Cardiovascular with S1 and S2 present and regular with no gallops or murmurs ?Respiratory with no rales or wheezing ?Abdomen not distended ?No lower extremity edema.  ?Data Reviewed: ? ? ? ?Family Communication: no family at the bedside  ? ?  Disposition: ?Status is: Inpatient ?Remains inpatient appropriate because: wound infections  ? Planned Discharge Destination: Home ? ?Author: ?Tawni Millers, MD ?12/19/2021 1:57 PM ? ?For on call review [email or web address].  ?

## 2021-12-19 NOTE — Assessment & Plan Note (Addendum)
Post operative anemia on anemia of chronic disease.  ?SP 2 units PRBC transfusion with good toleration. ? ?hgb has been stable at 8,0 range.  ?

## 2021-12-19 NOTE — Evaluation (Signed)
Physical Therapy Evaluation ?Patient Details ?Name: Kevin Gross ?MRN: [id number] ?DOB: 12-02-1955 ?Today's Date: 12/19/2021 ? ?History of Present Illness ? Pt is a 66 y.o. M who presents 12/12/2021 with infected post surgical lymphcele bilateral groinds. Underwent wound washout on 4/18 and I&D on 4/20. Significant PMH: alcohol related cirrhosis, COPD, heart failure, obesity, HTN, bifemoral endarterectomy on 3/31.  ?Clinical Impression ? PTA, pt is the caregiver for his wife; has been using cane vs walker intermittently and riding electric scooter in grocery store for longer community distances. Pt overall verbalizing good pain control. Ambulating room distance with walker at a min guard assist level. Reports mild dizziness and muscle fatigue following walk. BP 109/68, HR 98-122, SpO2 100% on RA. Will continue to progress mobility as tolerated. ? ?   ? ?Recommendations for follow up therapy are one component of a multi-disciplinary discharge planning process, led by the attending physician.  Recommendations may be updated based on patient status, additional functional criteria and insurance authorization. ? ?Follow Up Recommendations Home health PT ? ?  ?Assistance Recommended at Discharge Intermittent Supervision/Assistance  ?Patient can return home with the following ? A little help with walking and/or transfers;A little help with bathing/dressing/bathroom;Assistance with cooking/housework;Assist for transportation;Help with stairs or ramp for entrance ? ?  ?Equipment Recommendations None recommended by PT  ?Recommendations for Other Services ?    ?  ?Functional Status Assessment Patient has had a recent decline in their functional status and demonstrates the ability to make significant improvements in function in a reasonable and predictable amount of time.  ? ?  ?Precautions / Restrictions Precautions ?Precautions: Fall;Other (comment) ?Precaution Comments: wound vac ?Restrictions ?Weight Bearing Restrictions: No   ? ?  ? ?Mobility ? Bed Mobility ?Overal bed mobility: Modified Independent ?  ?  ?  ?  ?  ?  ?  ?  ? ?Transfers ?Overall transfer level: Needs assistance ?Equipment used: Rolling walker (2 wheels) ?Transfers: Sit to/from Stand ?Sit to Stand: Min assist ?  ?  ?  ?  ?  ?General transfer comment: Light minA from lower toilet surface ?  ? ?Ambulation/Gait ?Ambulation/Gait assistance: Min guard ?Gait Distance (Feet): 30 Feet ?Assistive device: Rolling walker (2 wheels) ?Gait Pattern/deviations: Step-through pattern, Decreased stride length, Wide base of support ?Gait velocity: decreased ?  ?  ?General Gait Details: Utilizing wider BOS, min guard for safety, mildly flexed posture ? ?Stairs ?  ?  ?  ?  ?  ? ?Wheelchair Mobility ?  ? ?Modified Rankin (Stroke Patients Only) ?  ? ?  ? ?Balance Overall balance assessment: Needs assistance ?Sitting-balance support: No upper extremity supported, Feet supported ?Sitting balance-Leahy Scale: Good ?  ?  ?Standing balance support: Bilateral upper extremity supported, During functional activity, Reliant on assistive device for balance ?Standing balance-Leahy Scale: Poor ?Standing balance comment: Reliant on RW ?  ?  ?  ?  ?  ?  ?  ?  ?  ?  ?  ?   ? ? ? ?Pertinent Vitals/Pain Pain Assessment ?Pain Assessment: Faces ?Faces Pain Scale: Hurts a little bit ?Pain Location: groin ?Pain Descriptors / Indicators: Grimacing, Discomfort ?Pain Intervention(s): Monitored during session  ? ? ?Home Living Family/patient expects to be discharged to:: Private residence ?Living Arrangements: Spouse/significant other ?Available Help at Discharge: Family;Available 24 hours/day ?Type of Home: House ?Home Access: Stairs to enter ?Entrance Stairs-Rails: Can reach both ?Entrance Stairs-Number of Steps: 3 ?  ?Home Layout: One level ?Home Equipment: Rollator (4 wheels);Other (comment);Wheelchair - manual;Tub bench ?  Additional Comments: Pt is caregiver for wife who has COPD; his son in law that lives with  them in an amputee and has a tub bench and various DME  ?  ?Prior Function Prior Level of Function : Independent/Modified Independent;Driving ?  ?  ?  ?  ?  ?  ?Mobility Comments: using walker rarely, intermittently using cane. Has been using electric scooter for longer distances with grocery shopping ?ADLs Comments: Was caregiver for his wife who has COPD, taking care of her O2 and doing the shopping, cleaning, cooking etc. Does not have to physically assist his wife for mobility or ADLs. ?  ? ? ?Hand Dominance  ? Dominant Hand: Right ? ?  ?Extremity/Trunk Assessment  ? Upper Extremity Assessment ?Upper Extremity Assessment: Overall WFL for tasks assessed ?  ? ?Lower Extremity Assessment ?Lower Extremity Assessment: Overall WFL for tasks assessed ?  ? ?   ?Communication  ? Communication: No difficulties  ?Cognition Arousal/Alertness: Awake/alert ?Behavior During Therapy: [facility] for tasks assessed/performed ?Overall Cognitive Status: Within Functional Limits for tasks assessed ?  ?  ?  ?  ?  ?  ?  ?  ?  ?  ?  ?  ?  ?  ?  ?  ?  ?  ?  ? ?  ?General Comments   ? ?  ?Exercises    ? ?Assessment/Plan  ?  ?PT Assessment Patient needs continued PT services  ?PT Problem List Decreased strength;Decreased activity tolerance;Decreased balance;Decreased mobility;Cardiopulmonary status limiting activity;Impaired sensation;Decreased skin integrity ? ?   ?  ?PT Treatment Interventions DME instruction;Gait training;Stair training;Functional mobility training;Therapeutic activities;Therapeutic exercise;Balance training;Neuromuscular re-education;Patient/family education   ? ?PT Goals (Current goals can be found in the Care Plan section)  ?Acute Rehab PT Goals ?Patient Stated Goal: home ?PT Goal Formulation: With patient ?Time For Goal Achievement: 01/02/22 ?Potential to Achieve Goals: Good ? ?  ?Frequency Min 3X/week ?  ? ? ?Co-evaluation   ?  ?  ?  ?  ? ? ?  ?AM-PAC PT "6 Clicks" Mobility  ?Outcome Measure Help needed turning from your  back to your side while in a flat bed without using bedrails?: None ?Help needed moving from lying on your back to sitting on the side of a flat bed without using bedrails?: None ?Help needed moving to and from a bed to a chair (including a wheelchair)?: A Little ?Help needed standing up from a chair using your arms (e.g., wheelchair or bedside chair)?: A Little ?Help needed to walk in hospital room?: A Little ?Help needed climbing 3-5 steps with a railing? : A Lot ?6 Click Score: 19 ? ?  ?End of Session   ?Activity Tolerance: Patient tolerated treatment well ?Patient left: in chair;with call bell/phone within reach ?Nurse Communication: Mobility status ?PT Visit Diagnosis: Unsteadiness on feet (R26.81);Other abnormalities of gait and mobility (R26.89);Muscle weakness (generalized) (M62.81);Difficulty in walking, not elsewhere classified (R26.2) ?  ? ?Time: 3338-3291 ?PT Time Calculation (min) (ACUTE ONLY): 28 min ? ? ?Charges:   PT Evaluation ?$PT Eval Low Complexity: 1 Low ?PT Treatments ?$Therapeutic Activity: 8-22 mins ?  ?   ? ? ?Wyona Almas, PT, DPT ?Acute Rehabilitation Services ?Pager [phone] ?Office [phone] ? ? ?Carloine Margo Aye ?12/19/2021, 3:27 PM ? ?

## 2021-12-19 NOTE — Progress Notes (Addendum)
?  Progress Note ? ? ? ?12/19/2021 ?10:05 AM ?3 Days Post-Op ? ?Subjective:  no complaints ? ? ?Vitals:  ? 12/19/21 0328 12/19/21 0748  ?BP: 103/79 (!) 145/64  ?Pulse: 75   ?Resp: 19 (!) 21  ?Temp: 98.8 ?F (37.1 ?C) 98.2 ?F (36.8 ?C)  ?SpO2: 97% 100%  ? ?Physical Exam: ?Cardiac:  regular ?Lungs:  non labored ?Incisions:  Right groin with Wound VAC to suction. Left groin well appearing. Hemostatic. Wet to dry dressings applied with Kerlix and ABD ?Extremities:  well perfused and warm ?Abdomen:  obese, soft ?Neurologic: alert and oriented ? ?CBC ?   ?Component Value Date/Time  ? WBC 7.3 12/19/2021 0451  ? RBC 2.88 (L) 12/19/2021 0451  ? HGB 8.6 (L) 12/19/2021 0451  ? HGB 13.3 10/12/2021 1314  ? HCT 28.0 (L) 12/19/2021 0451  ? HCT 39.4 10/12/2021 1314  ? PLT 334 12/19/2021 0451  ? PLT 175 10/12/2021 1314  ? MCV 97.2 12/19/2021 0451  ? MCV 97 10/12/2021 1314  ? MCH 29.9 12/19/2021 0451  ? MCHC 30.7 12/19/2021 0451  ? RDW 18.9 (H) 12/19/2021 0451  ? RDW 13.1 10/12/2021 1314  ? LYMPHSABS 0.9 12/12/2021 1702  ? LYMPHSABS 1.9 10/12/2021 1314  ? MONOABS 0.7 12/12/2021 1702  ? EOSABS 0.4 12/12/2021 1702  ? EOSABS 0.7 (H) 10/12/2021 1314  ? BASOSABS 0.0 12/12/2021 1702  ? BASOSABS 0.1 10/12/2021 1314  ? ? ?BMET ?   ?Component Value Date/Time  ? NA 139 12/19/2021 0451  ? NA 145 (H) 10/12/2021 1314  ? K 4.3 12/19/2021 0451  ? CL 106 12/19/2021 0451  ? CO2 24 12/19/2021 0451  ? GLUCOSE 105 (H) 12/19/2021 0451  ? BUN 37 (H) 12/19/2021 0451  ? BUN 37 (H) 10/12/2021 1314  ? CREATININE 2.28 (H) 12/19/2021 0451  ? CALCIUM 9.4 12/19/2021 0451  ? GFRNONAA 31 (L) 12/19/2021 0451  ? GFRAA 56 (L) 10/26/2019 0551  ? ? ?INR ?   ?Component Value Date/Time  ? INR 1.2 12/13/2021 0002  ? ? ? ?Intake/Output Summary (Last 24 hours) at 12/19/2021 1005 ?Last data filed at 12/19/2021 7262 ?Gross per 24 hour  ?Intake 440 ml  ?Output 1750 ml  ?Net -1310 ml  ? ? ? ?Assessment/Plan:  66 y.o. male is s/p Incision and debridement of bilateral groin wounds  including skin and subcutaneous tissue and fat. #2: Placement of bilateral wound vacs (left dimensions or 7 x 6 x 6 cm.  Right was 6 x 5 x 4 cm)  3 Days Post-Op  ? ?Doing well ?Left groin dressing changed today. Wound well appearing. Wet to dry dressing reapplied ?Plan for R groin wound VAC change tomorrow to get on MWF schedule ?H&H stable ?Scr trending back down, 2.26 today ?Continues on Abx ?Appreciate TRH continued assistance ?Continue to mobilize as tolerated ? ? ? ?Karoline Caldwell, PA-C ?[facility] ?[phone] ?12/19/2021 ?10:05 AM ? ?VASCULAR STAFF ADDENDUM: ?I have independently interviewed and examined the patient. ?I agree with the above.  ?Doing well.  ?Dressing changed at bedside. ?Wound volume appears smaller. Wound hemostatic.  ?Will continue VAC to R groin. ?Wet-to-dry to left groin. ? ?Yevonne Aline. Stanford Breed, MD ?[facility] [place] ?Office Phone Number: [phone] ?12/19/2021 11:01 AM ? ? ?

## 2021-12-19 NOTE — Hospital Course (Addendum)
Medical consultation.  ? ?Mr. Heppler, 66 yo male with the past medical history of alcohol related cirrhosis, COPD, heart failure, dyslipidemia, obesity and hypertension developed wound infection following bifemoral endarterectomy on 03/31. He reported drainage from groin wounds more left than right, along with local bleeding and edema.  ?Admitted to the hospital on 12/12/21 with the working diagnosis of infected post surgical lymphocele bilateral groins.  ?Patient was placed on antibiotic therapy. ?Underwent wound washout on 04/18 and I&D on 04/20   ? ?Wound culture positive for MSSA, Klebseila and enterococcus fecalis.   ? ?04/21 PRBC transfused x2 for worsening anemia.  ? ?04/24 patient continue hemodynamically stable.  ?04/25 plan for further debridement in OR tomorrow  ?

## 2021-12-20 ENCOUNTER — Ambulatory Visit: Payer: Medicare PPO | Admitting: Medical

## 2021-12-20 ENCOUNTER — Encounter: Payer: Medicare PPO | Admitting: Surgery

## 2021-12-20 DIAGNOSIS — T8149XA Infection following a procedure, other surgical site, initial encounter: Secondary | ICD-10-CM | POA: Diagnosis not present

## 2021-12-20 DIAGNOSIS — K7030 Alcoholic cirrhosis of liver without ascites: Secondary | ICD-10-CM

## 2021-12-20 DIAGNOSIS — N179 Acute kidney failure, unspecified: Secondary | ICD-10-CM | POA: Diagnosis not present

## 2021-12-20 LAB — CBC
HCT: 26.1 % — ABNORMAL LOW (ref 39.0–52.0)
Hemoglobin: 8.3 g/dL — ABNORMAL LOW (ref 13.0–17.0)
MCH: 30.9 pg (ref 26.0–34.0)
MCHC: 31.8 g/dL (ref 30.0–36.0)
MCV: 97 fL (ref 80.0–100.0)
Platelets: 278 10*3/uL (ref 150–400)
RBC: 2.69 MIL/uL — ABNORMAL LOW (ref 4.22–5.81)
RDW: 19.3 % — ABNORMAL HIGH (ref 11.5–15.5)
WBC: 8.9 10*3/uL (ref 4.0–10.5)
nRBC: 0 % (ref 0.0–0.2)

## 2021-12-20 LAB — GLUCOSE, CAPILLARY
Glucose-Capillary: 108 mg/dL — ABNORMAL HIGH (ref 70–99)
Glucose-Capillary: 101 mg/dL — ABNORMAL HIGH (ref 70–99)
Glucose-Capillary: 123 mg/dL — ABNORMAL HIGH (ref 70–99)
Glucose-Capillary: 131 mg/dL — ABNORMAL HIGH (ref 70–99)

## 2021-12-20 LAB — BASIC METABOLIC PANEL
Anion gap: 9 (ref 5–15)
BUN: 34 mg/dL — ABNORMAL HIGH (ref 8–23)
CO2: 23 mmol/L (ref 22–32)
Calcium: 9 mg/dL (ref 8.9–10.3)
Chloride: 105 mmol/L (ref 98–111)
Creatinine, Ser: 2.35 mg/dL — ABNORMAL HIGH (ref 0.61–1.24)
GFR, Estimated: 30 mL/min — ABNORMAL LOW (ref >60)
Glucose, Bld: 113 mg/dL — ABNORMAL HIGH (ref 70–99)
Potassium: 3.8 mmol/L (ref 3.5–5.1)
Sodium: 137 mmol/L (ref 135–145)

## 2021-12-20 MED ORDER — ADULT MULTIVITAMIN W/MINERALS CH
1.0000 | ORAL_TABLET | Freq: Every day | ORAL | Status: DC
  Administered 2021-12-20 – 2021-12-24 (×5): 1 via ORAL
  Filled 2021-12-20 (×5): qty 1

## 2021-12-20 MED ORDER — ENSURE ENLIVE PO LIQD
237.0000 mL | Freq: Two times a day (BID) | ORAL | Status: DC
  Administered 2021-12-20 – 2021-12-24 (×8): 237 mL via ORAL

## 2021-12-20 NOTE — Progress Notes (Signed)
Initial Nutrition Assessment ? ?DOCUMENTATION CODES:  ? ?Not applicable ? ?INTERVENTION:  ? ?Ensure Enlive po BID, each supplement provides 350 kcal and 20 grams of protein. ? ?MVI with minerals daily. ? ?NUTRITION DIAGNOSIS:  ? ?Increased nutrient needs related to wound healing as evidenced by estimated needs. ? ?GOAL:  ? ?Patient will meet greater than or equal to 90% of their needs ? ?MONITOR:  ? ?PO intake, Supplement acceptance, Skin ? ?REASON FOR ASSESSMENT:  ? ?Consult ?Assessment of nutrition requirement/status ? ?ASSESSMENT:  ? ?66 yo male admitted with bilateral groin wound infection. PMH includes alcohol related cirrhosis, COPD, CHF, HTN, HLD, clotting disorder, pre-diabetes. ? ?4/18 - S/P bilateral groin wound washout debridement, VAC placement. ?4/20 - S/P I&D of bilateral groins. ? ?Patient reports good appetite and good intake. ?He is not sure if he has lost any weight.  ?Discussed good sources of protein with patient. He was very receptive. ?He has had Ensure supplements in the past and likes all the flavors. ? ?Currently on a regular diet. ?Meal intakes: 50-100%. ? ?Labs reviewed.  ?CBG: 108 this AM ? ?Medications reviewed and include cholecalciferol, Novolog, Protonix, Flomax. ?  ?Weight trend reviewed. Weight has fluctuated between 133 kg and 147 kg over the past 4 months. Suspect weight fluctuations are related to fluid status.  ? ?NUTRITION - FOCUSED PHYSICAL EXAM: ? ?Flowsheet Row Most Recent Value  ?Orbital Region No depletion  ?Upper Arm Region No depletion  ?Thoracic and Lumbar Region No depletion  ?Buccal Region No depletion  ?Temple Region No depletion  ?Clavicle Bone Region No depletion  ?Clavicle and Acromion Bone Region No depletion  ?Scapular Bone Region No depletion  ?Dorsal Hand No depletion  ?Patellar Region No depletion  ?Anterior Thigh Region No depletion  ?Posterior Calf Region No depletion  ?Edema (RD Assessment) Mild  ?Hair Reviewed  ?Eyes Reviewed  ?Mouth Reviewed  ?Skin  Reviewed  ?Nails Reviewed  ? ?  ? ? ?Diet Order:   ?Diet Order   ? ?       ?  Diet regular Room service appropriate? Yes; Fluid consistency: Thin  Diet effective now       ?  ? ?  ?  ? ?  ? ? ?EDUCATION NEEDS:  ? ?Education needs have been addressed ? ?Skin:  Skin Assessment: Skin Integrity Issues: ?Skin Integrity Issues:: Incisions, Wound VAC ?Wound Vac: R groin ?Incisions: L groin ? ?Last BM:  4/24 type 7 ? ?Height:  ? ?Ht Readings from Last 1 Encounters:  ?12/13/21 '5\' 8"'$  (1.727 m)  ? ? ?Weight:  ? ?Wt Readings from Last 1 Encounters:  ?12/18/21 133.1 kg  ? ? ? ?BMI:  Body mass index is 44.62 kg/m?. ? ?Estimated Nutritional Needs:  ? ?Kcal:  2200-2400 ? ?Protein:  140-160 gm ? ?Fluid:  >/= 2.2 L ? ? ? ?Lucas Mallow RD, LDN, CNSC ?Please refer to Amion for contact information.                                                       ? ?

## 2021-12-20 NOTE — Care Management Important Message (Signed)
Important Message ? ?Patient Details  ?Name: Kevin Gross ?MRN: [id number] ?Date of Birth: Jan 24, 1956 ? ? ?Medicare Important Message Given:  Yes ? ? ? ? ?[name] ?12/20/2021, 9:05 AM ?

## 2021-12-20 NOTE — TOC Initial Note (Signed)
Transition of Care (TOC) - Initial/Assessment Note  ? ? ?Patient Details  ?Name: Kevin Gross ?MRN: [id number] ?Date of Birth: December 20, 1955 ? ?Transition of Care [facility]) CM/SW Contact:    ?Ninfa Meeker, RN ?Phone Number: ?12/20/2021, 11:58 AM ? ?Clinical Narrative:  Patient is a  66 y.o. male is s/p Incision and debridement of bilateral groin wounds including skin and subcutaneous tissue and fat. Had placement of bilateral wound vacs . Per notes, determination to be made regarding further debridement versus wet-dry drsg changes. Transition of Care Team will continue to follow patient for needs.       ?        ? ? ?  ?  ? ? ?Patient Goals and CMS Choice ?  ?  ?  ? ?Expected Discharge Plan and Services ?  ?  ?  ?  ?  ?                ?  ?  ?  ?  ?  ?  ?  ?  ?  ?  ? ?Prior Living Arrangements/Services ?  ?  ?  ?       ?  ?  ?  ?  ? ?Activities of Daily Living ?Home Assistive Devices/Equipment: Cane (specify quad or straight), CPAP, Blood pressure cuff, Walker (specify type) ?ADL Screening (condition at time of admission) ?Patient's cognitive ability adequate to safely complete daily activities?: Yes ?Is the patient deaf or have difficulty hearing?: No ?Does the patient have difficulty seeing, even when wearing glasses/contacts?: No ?Does the patient have difficulty concentrating, remembering, or making decisions?: No ?Patient able to express need for assistance with ADLs?: Yes ?Does the patient have difficulty dressing or bathing?: No ?Independently performs ADLs?: Yes (appropriate for developmental age) ?Does the patient have difficulty walking or climbing stairs?: No ?Weakness of Legs: Both ?Weakness of Arms/Hands: Both ? ?Permission Sought/Granted ?  ?  ?   ?   ?   ?   ? ?Emotional Assessment ?  ?  ?  ?  ?  ?  ? 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 Diagnosis Date Noted  ? Anemia 12/19/2021  ? Alcoholic cirrhosis of liver without ascites ([place]) 12/13/2021  ? Pre-diabetes 12/13/2021  ? OSA (obstructive sleep apnea) 12/13/2021  ? Essential hypertension 12/13/2021  ? Wound infection after surgery 12/12/2021  ? PAF (paroxysmal atrial fibrillation) ([place])   ? 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 ([place]) 11/26/2021  ? PAD (peripheral artery disease) ([place]) 11/26/2021  ? Cellulitis of left lower extremity 02/18/2019  ? Aortic atherosclerosis ([place]) 04/10/2018  ? Class 3 obesity ([place]) 04/10/2018  ? CAD (coronary artery disease), native coronary artery 04/08/2018  ? COPD (chronic obstructive pulmonary disease) ([place]) 04/08/2018  ? Acute kidney injury superimposed on chronic kidney disease ([place]) 04/08/2018  ? ?PCP:  Rutherford Limerick, PA ?Pharmacy:   ?[facility] ?[place] [address] ?Phone: [phone] Fax: [phone] ? ?[facility] ?[address] ?[place] [address] ?Phone: [phone] Fax: [phone] ? ? ? ? ?Social Determinants of Health (SDOH) Interventions ?  ? ?Readmission Risk Interventions ? ?  12/06/2021  ? 10:01 AM  ?Readmission Risk Prevention Plan  ?Transportation Screening Complete  ?  PCP or Specialist Appt within 5-7 Days Complete  ?Home Care Screening Complete  ?Medication Review (RN CM) Complete  ? ? ? ?

## 2021-12-20 NOTE — Progress Notes (Signed)
Pharmacy Antibiotic Note ? ?Kevin Gross is a 66 y.o. male admitted on 12/12/2021 with L groin wound with persistent lymphatic drainage.  He is s/p bilateral groin debridement on 4/18 and 4/20.  R groin culture now growing rare staph aureus that is pan-sensitive. Pharmacy has been asked to narrow vancomycin and Zosyn to cefazolin.   ? ?SCr 2.35, CrCl >30 ml/min. He is afebrile, WBC WNL.  ? ?Plan: ?Cefazolin 2g IV q8h ?F/U renal function and clinical progress ?F/U LOT ? ? ?Temp (24hrs), Avg:98.1 ?F (36.7 ?C), Min:97.8 ?F (36.6 ?C), Max:98.4 ?F (36.9 ?C) ? ?Recent Labs  ?Lab 12/16/21 ?0239 12/16/21 ?1517 12/17/21 ?0602 12/18/21 ?0133 12/19/21 ?0451 12/20/21 ?3532  ?WBC 6.3 6.8 7.1 7.3 7.3 8.9  ?CREATININE 2.03*  --  3.28* 2.95* 2.28* 2.35*  ? ?  ?Estimated Creatinine Clearance: 42.4 mL/min (A) (by C-G formula based on SCr of 2.35 mg/dL (H)).   ? ?No Known Allergies ? ?Antimicrobials this admission: ?Vanc 4/16 >> 4/21 ?Zosyn 4/16 >> 4/21 ?Cefazolin 4/21 > ? ?Dose adjustments this admission: ?N/A ? ?Microbiology results: ?4/17 L groin wound - few kleb oxy, mod e. Faecalis, few MSSA (final) ?4/17 R groin (OR) - no orgs; rare staph aureus (panS) ?  ? ?Thank you for involving pharmacy in this patient's care. ? ?Renold Genta, PharmD, BCPS ?Clinical Pharmacist ?Clinical phone for 12/20/2021 until 3p is [phone] ?12/20/2021 1:40 PM ? ?**Pharmacist phone directory can be found on [email or web address] listed under [place]** ? ?

## 2021-12-20 NOTE — Progress Notes (Signed)
Placed pt on Cpap for night. Pt tolerating well. No resp distress noted.  ?

## 2021-12-20 NOTE — Evaluation (Addendum)
Occupational Therapy Evaluation ?Patient Details ?Name: Kevin Gross ?MRN: [id number] ?DOB: 1956/07/21 ?Today's Date: 12/20/2021 ? ? ?History of Present Illness 66 y.o. M who presents 12/12/2021 with infected post surgical lymphcele bilateral groinds. Underwent wound washout on 4/18 and I&D on 4/20. Significant PMH: alcohol related cirrhosis, COPD, heart failure, obesity, HTN, bifemoral endarterectomy on 3/31.  ? ?Clinical Impression ?  ?PTA, pt was living with his wife and was independent with ADLs; wife assists with IADLs. Evaluation limited as pt with wound vac off (and on floor) and with copious amounts of blood from R groin site. Pt reporting MD came and detached wound vac with plan to repack groin sites. Notified RN. Assisting RN in rolling patient with Min Guard A for placement of bedpan for BM. Will return to assist further clean up due to significant amount of blood. Pending progress, recommend dc to home with HHOT and will follow acutely to facilitate safe dc.  ?   ? ?Recommendations for follow up therapy are one component of a multi-disciplinary discharge planning process, led by the attending physician.  Recommendations may be updated based on patient status, additional functional criteria and insurance authorization.  ? ?Follow Up Recommendations ? Home health OT (Pending progress)  ?  ?Assistance Recommended at Discharge    ?Patient can return home with the following   ? ?  ?Functional Status Assessment ?    ?Equipment Recommendations ? None recommended by OT  ?  ?Recommendations for Other Services PT consult ? ? ?  ?Precautions / Restrictions Precautions ?Precautions: Fall;Other (comment) ?Precaution Comments: wound vac ?Restrictions ?Weight Bearing Restrictions: No  ? ?  ? ?Mobility Bed Mobility ?Overal bed mobility: Modified Independent ?Bed Mobility: Rolling ?Rolling: Min guard ?  ?  ?  ?  ?  ?  ? ?Transfers ?  ?  ?  ?  ?  ?  ?  ?  ?  ?General transfer comment: Defer for safety ?  ? ?  ?Balance   ?   ?  ?  ?  ?  ?  ?  ?  ?  ?  ?  ?  ?  ?  ?  ?  ?  ?  ?   ? ?ADL either performed or assessed with clinical judgement  ? ?ADL Overall ADL's : Needs assistance/impaired ?  ?  ?  ?  ?  ?  ?  ?  ?  ?  ?  ?  ?  ?  ?  ?  ?  ?  ?  ?General ADL Comments: Evaluation limited as pt with copious amount of bleed from R groin site upon arrival. Assist RN for rolling pt onto bed pan and then cleaning blood and chnging linens. Pt very motivated and able to perform rolling in bed wiht Min Guard A. Feel he will progress well  ? ? ? ?Vision Baseline Vision/History: 1 Wears glasses ?   ?   ?Perception   ?  ?Praxis   ?  ? ?Pertinent Vitals/Pain Pain Assessment ?Pain Assessment: Faces ?Faces Pain Scale: Hurts a little bit ?Pain Location: groin ?Pain Descriptors / Indicators: Grimacing, Discomfort ?Pain Intervention(s): Limited activity within patient's tolerance, Monitored during session, Repositioned  ? ? ? ?Hand Dominance Right ?  ?Extremity/Trunk Assessment Upper Extremity Assessment ?Upper Extremity Assessment: Overall WFL for tasks assessed ?  ?Lower Extremity Assessment ?Lower Extremity Assessment: Overall WFL for tasks assessed ?  ?  ?  ?Communication Communication ?Communication: No difficulties ?  ?Cognition Arousal/Alertness: Awake/alert ?  Behavior During Therapy: [facility] for tasks assessed/performed ?Overall Cognitive Status: Within Functional Limits for tasks assessed ?  ?  ?  ?  ?  ?  ?  ?  ?  ?  ?  ?  ?  ?  ?  ?  ?  ?  ?  ?General Comments  VSS. Upon arriving, pt with wound vac unplugged (and on floor) and with copious amount of blood coming from R groin site. ? ?  ?Exercises   ?  ?Shoulder Instructions    ? ? ?Home Living Family/patient expects to be discharged to:: Private residence ?Living Arrangements: Spouse/significant other ?Available Help at Discharge: Family;Available 24 hours/day ?Type of Home: House ?Home Access: Stairs to enter ?Entrance Stairs-Number of Steps: 3 ?Entrance Stairs-Rails: Can reach both ?Home Layout:  One level ?  ?  ?Bathroom Shower/Tub: Gaffer;Tub/shower unit ?  ?Bathroom Toilet: Standard ?Bathroom Accessibility: No ?  ?Home Equipment: Rollator (4 wheels);Other (comment);Wheelchair - manual;Tub bench ?  ?Additional Comments: Pt is caregiver for wife who has COPD; his son in law that lives with them in an amputee and has a tub bench and various DME ?  ? ?  ?Prior Functioning/Environment Prior Level of Function : Independent/Modified Independent;Driving ?  ?  ?  ?  ?  ?  ?Mobility Comments: using walker rarely, intermittently using cane. Has been using electric scooter for longer distances with grocery shopping ?ADLs Comments: Was caregiver for his wife who has COPD, taking care of her O2 and doing the shopping, cleaning, cooking etc. Does not have to physically assist his wife for mobility or ADLs. ?  ? ?  ?  ?OT Problem List: Decreased activity tolerance;Impaired balance (sitting and/or standing) ?  ?   ?OT Treatment/Interventions: Self-care/ADL training;DME and/or AE instruction;Therapeutic activities;Patient/family education;Balance training  ?  ?OT Goals(Current goals can be found in the care plan section) Acute Rehab OT Goals ?Patient Stated Goal: Go home ?OT Goal Formulation: With patient ?Time For Goal Achievement: 01/03/22 ?Potential to Achieve Goals: Good  ?OT Frequency: Min 2X/week ?  ? ?Co-evaluation   ?  ?  ?  ?  ? ?  ?AM-PAC OT "6 Clicks" Daily Activity     ?Outcome Measure Help from another person eating meals?: None ?Help from another person taking care of personal grooming?: A Little ?Help from another person toileting, which includes using toliet, bedpan, or urinal?: A Little ?Help from another person bathing (including washing, rinsing, drying)?: A Lot ?Help from another person to put on and taking off regular upper body clothing?: A Little ?Help from another person to put on and taking off regular lower body clothing?: A Lot ?6 Click Score: 17 ?  ?End of Session Nurse Communication:  Other (comment) (Significant bleeding from R groin site and wound vac unplugged) ? ?Activity Tolerance: Treatment limited secondary to medical complications (Comment) ?Patient left: in bed;with call bell/phone within reach ? ?OT Visit Diagnosis: Unsteadiness on feet (R26.81);Other abnormalities of gait and mobility (R26.89);Muscle weakness (generalized) (M62.81);Pain ?Pain - Right/Left: Right ?Pain - part of body: Leg  ?              ?Time: 7371-0626 ?OT Time Calculation (min): 13 min ?Charges:  OT General Charges ?$OT Visit: 1 Visit ?OT Evaluation ?$OT Eval Moderate Complexity: 1 Mod ? ?Azora Bonzo MSOT, OTR/L ?Acute Rehab ?Pager: [phone] ?Office: [phone] ? ?Yochanan Eddleman M Ziya Coonrod ?12/20/2021, 1:59 PM ?

## 2021-12-20 NOTE — Progress Notes (Signed)
?Progress Note ? ? ?Patient: Kevin Gross DOB: 1956/06/15 DOA: 12/12/2021     8 ?DOS: the patient was seen and examined on 12/20/2021 ?  ?Brief hospital course: ?Medical consultation.  ? ?Kevin Gross, 66 yo male with the past medical history of alcohol related cirrhosis, COPD, heart failure, dyslipidemia, obesity and hypertension developed wound infection following bifemoral endarterectomy on 03/31. He reported drainage from groin wounds more left than right, along with local bleeding and edema.  ?Admitted to the hospital on 12/12/21 with the working diagnosis of infected post surgical lymphocele bilateral groins.  ?Patient was placed on antibiotic therapy. ?Underwent wound washout on 04/18 and I&D on 04/20   ? ?Wound culture positive for MSSA, Klebseila and enterococcus fecalis.   ? ?04/21 PRBC transfused x2 for worsening anemia.  ? ?04/24 patient continue hemodynamically stable.  ? ? ?Assessment and Plan: ?* Wound infection after surgery ?Infected lymphocele of bilateral groins. ?04/18 bilateral groin wound washout debridement, antibiotic bead placement and VAC placement. ?04/20 I&D of bilateral groin wounds including skin and subcutaneous tissue fat.  ?Placement of bilateral wound vacs.  ? ?Culture positive for klebsiella, enterococcus and staphylococcus (MSSA).  ?Patient has been afebrile. Wbc 7.3  ? ?Groin pain is well controlled with analgesics ? ?Antibiotic therapy with cefazolin. ?Continue wound vac to right groin and wet to dry to the left groin.   ?Out of bed to chair tid with meals.  ?Further wound care per primary team.  ? ? ? ?Acute kidney injury superimposed on chronic kidney disease ([place]) ?Patient with no signs of hypervolemia.  ? ?Renal function with serum cr at 2,35 with K at 3,8 and serum bicarbonate at 23.  ?Continue close follow up on renal function and electrolytes.  ? ? ?Pre-diabetes ?-Recent A1c was 5.6  ?Continue glucose cover and monitoring with insulin sliding scale.  ?Fasting  glucose this am 113 mg/dl  ? ?Dyslipidemia, continue with statin therapy.  ? ?Essential hypertension ?Blood pressure has been stable with systolic 297 to 989 mmHg.  ?Continue close monitoring.  ? ?COPD (chronic obstructive pulmonary disease) ([place]) ?No acute exacerbation.  ? ? ?PAD (peripheral artery disease) ([place]) ?Continue with aspirin and clopidogrel ?Patient sp revascularization  ? ?PAF (paroxysmal atrial fibrillation) ([place]) ?Old records personally reviewed cardiology evaluation from 12/04/21. ?Patient with postoperative atrial fibrillation, complicated with acute blood loss anemia and requiring PRBC transfusion.  ?Recommendations to continue rate control with amiodarone and no anticoagulation for now.  ?Continue with aspirin and clopidogrel for ilac stents, ?Post hospitalization 30 day monitor to consider anticoagulation if significant paroxysmal atrial fibrillation.  ? ? ? ?Alcoholic cirrhosis of liver without ascites ([place]) ?No clinical signs of withdrawal. ?Continue close neuro checks, out of bed to chair tid with meals.  ?Nutrition consult.  ? ?OSA (obstructive sleep apnea) ?-Continue CPAP ? ?Anemia ?Post operative anemia on anemia of chronic disease.  ?SP 2 units PRBC transfusion with good toleration. ?Follow up hgb is 8.6  ? ?Class 3 obesity (HCC) ?-Body mass index is 44.85 kg/m?. ? ? ? ? ? ?  ? ?Subjective: Patient is feeling better, no nausea or vomiting, wound pain is controlled with analgesics  ? ?Physical Exam: ?Vitals:  ? 12/19/21 2330 12/19/21 2348 12/20/21 0734 12/20/21 1136  ?BP: 138/67 138/67 136/69 124/72  ?Pulse: 76 80 69 74  ?Resp: 14 17 (!) 21 17  ?Temp: 98 ?F (36.7 ?C)  98.3 ?F (36.8 ?C) 97.8 ?F (36.6 ?C)  ?TempSrc: Oral  Oral Oral  ?SpO2: 96%  100% 99%  ?Weight:      ?Height:      ? ?Neurology awake and alert ?ENT with no pallor ?Cardiovascular with S1 and S2 present and rhythmic with no gallops\ ?Respiratory with no wheezing or rales ?Abdomen protuberant but not tender ?No lower extremity  edema ?Left groin with wound vac in place, right groin with dressing in place.  ?Data Reviewed: ? ? ? ?Family Communication: no family at the bedside  ? ?Disposition: ?Status is: Inpatient ?Remains inpatient appropriate because: wound iinfection  ? Planned Discharge Destination: Home ? ? ? ?Author: ?Tawni Millers, MD ?12/20/2021 3:04 PM ? ?For on call review [email or web address].  ?

## 2021-12-20 NOTE — Progress Notes (Signed)
? ? ?  Subjective  -  ? ?No complaints ? ? ?Physical Exam: ? ?Dressings changed today ? ? ? ? ? ? ? ? ? ?Assessment/Plan:   ? ?Wounds are improving.  Plan for operative debridement again on Wednesday ? ?Kevin Gross ?12/20/2021 ?6:19 PM ?-- ? ?Vitals:  ? 12/20/21 1136 12/20/21 1543  ?BP: 124/72 123/73  ?Pulse: 74 76  ?Resp: 17 19  ?Temp: 97.8 ?F (36.6 ?C) (!) 97.4 ?F (36.3 ?C)  ?SpO2: 99% 98%  ? ? ?Intake/Output Summary (Last 24 hours) at 12/20/2021 1819 ?Last data filed at 12/20/2021 1806 ?Gross per 24 hour  ?Intake 1320 ml  ?Output 925 ml  ?Net 395 ml  ? ? ? ?Laboratory ?CBC ?   ?Component Value Date/Time  ? WBC 8.9 12/20/2021 0255  ? HGB 8.3 (L) 12/20/2021 0255  ? HGB 13.3 10/12/2021 1314  ? HCT 26.1 (L) 12/20/2021 0255  ? HCT 39.4 10/12/2021 1314  ? PLT 278 12/20/2021 0255  ? PLT 175 10/12/2021 1314  ? ? ?BMET ?   ?Component Value Date/Time  ? NA 137 12/20/2021 0255  ? NA 145 (H) 10/12/2021 1314  ? K 3.8 12/20/2021 0255  ? CL 105 12/20/2021 0255  ? CO2 23 12/20/2021 0255  ? GLUCOSE 113 (H) 12/20/2021 0255  ? BUN 34 (H) 12/20/2021 0255  ? BUN 37 (H) 10/12/2021 1314  ? CREATININE 2.35 (H) 12/20/2021 0255  ? CALCIUM 9.0 12/20/2021 0255  ? GFRNONAA 30 (L) 12/20/2021 0255  ? GFRAA 56 (L) 10/26/2019 0551  ? ? ?COAG ?Lab Results  ?Component Value Date  ? INR 1.2 12/13/2021  ? INR 1.0 11/23/2021  ? ?No results found for: PTT ? ?Antibiotics ?Anti-infectives (From admission, onward)  ? ? Start     Dose/Rate Route Frequency Ordered Stop  ? 12/18/21 1400  ceFAZolin (ANCEF) IVPB 2g/100 mL premix       ? 2 g ?200 mL/hr over 30 Minutes Intravenous Every 8 hours 12/18/21 1147    ? 12/17/21 0900  ceFAZolin (ANCEF) IVPB 2g/100 mL premix  Status:  Discontinued       ? 2 g ?200 mL/hr over 30 Minutes Intravenous Every 12 hours 12/17/21 0806 12/18/21 1147  ? 12/17/21 0845  ceFAZolin (ANCEF) IVPB 2g/100 mL premix  Status:  Discontinued       ? 2 g ?200 mL/hr over 30 Minutes Intravenous Every 8 hours 12/17/21 0758 12/17/21 0806  ?  12/13/21 2300  vancomycin (VANCOCIN) IVPB 1000 mg/200 mL premix  Status:  Discontinued       ? 1,000 mg ?200 mL/hr over 60 Minutes Intravenous Every 24 hours 12/12/21 2240 12/17/21 0731  ? 12/12/21 2245  piperacillin-tazobactam (ZOSYN) IVPB 3.375 g  Status:  Discontinued       ? 3.375 g ?12.5 mL/hr over 240 Minutes Intravenous Every 8 hours 12/12/21 2236 12/17/21 0731  ? 12/12/21 2245  vancomycin (VANCOREADY) IVPB 2000 mg/400 mL       ? 2,000 mg ?200 mL/hr over 120 Minutes Intravenous  Once 12/12/21 2236 12/13/21 0405  ? ?  ? ? ? ?V. Leia Alf, M.D., FACS ?[facility] ?Office: [phone] ?Pager:  [phone]  ?

## 2021-12-20 NOTE — Progress Notes (Signed)
Occupational Therapy Treatment ?Patient Details ?Name: Kevin Gross ?MRN: [id number] ?DOB: 24-Sep-1955 ?Today's Date: 12/20/2021 ? ? ?History of present illness 66 y.o. M who presents 12/12/2021 with infected post surgical lymphcele bilateral groinds. Underwent wound washout on 4/18 and I&D on 4/20. Significant PMH: alcohol related cirrhosis, COPD, heart failure, obesity, HTN, bifemoral endarterectomy on 3/31. ?  ?OT comments ? Return to assess with clean up and transitioning off bedpan with large amount of blood in bed. Pt requiring Min guard A for rolling and then Max A for peri care. Assist with cleaning up blood at bed level. PA, Laurence Slate, arriving at end of session to redress groin site. Continue to recommend dc to home with HHOT and will continue to follow acutely as admitted.  ? ?Recommendations for follow up therapy are one component of a multi-disciplinary discharge planning process, led by the attending physician.  Recommendations may be updated based on patient status, additional functional criteria and insurance authorization. ?   ?Follow Up Recommendations ? Home health OT (Pending progress)  ?  ?Assistance Recommended at Discharge    ?Patient can return home with the following ?   ?  ?Equipment Recommendations ? None recommended by OT  ?  ?Recommendations for Other Services PT consult ? ?  ?Precautions / Restrictions Precautions ?Precautions: Fall;Other (comment) ?Precaution Comments: wound vac ?Restrictions ?Weight Bearing Restrictions: No  ? ? ?  ? ?Mobility Bed Mobility ?Overal bed mobility: Modified Independent ?Bed Mobility: Rolling ?Rolling: Min guard ?  ?  ?  ?  ?  ?  ? ?Transfers ?  ?  ?  ?  ?  ?  ?  ?  ?  ?General transfer comment: Defer for safety ?  ?  ?Balance   ?  ?  ?  ?  ?  ?  ?  ?  ?  ?  ?  ?  ?  ?  ?  ?  ?  ?  ?   ? ?ADL either performed or assessed with clinical judgement  ? ?ADL Overall ADL's : Needs assistance/impaired ?  ?  ?  ?  ?  ?  ?  ?  ?Upper Body Dressing : Moderate  assistance;Bed level ?Upper Body Dressing Details (indicate cue type and reason): Cleaning of blood ?Lower Body Dressing: Moderate assistance;Bed level ?Lower Body Dressing Details (indicate cue type and reason): Mod A for cleaning blood ?  ?  ?  ?  ?  ?  ?  ?General ADL Comments: Pt requiring Min guard A for rolling and Max A for peri care after BM. Requiring assistance to clean up large amount of blood at LEs, peri area, under stomach, and along his back. ?  ? ?Extremity/Trunk Assessment Upper Extremity Assessment ?Upper Extremity Assessment: Overall WFL for tasks assessed ?  ?Lower Extremity Assessment ?Lower Extremity Assessment: Overall WFL for tasks assessed ?  ?  ?  ? ?Vision Baseline Vision/History: 1 Wears glasses ?  ?  ?Perception   ?  ?Praxis   ?  ? ?Cognition Arousal/Alertness: Awake/alert ?Behavior During Therapy: [facility] for tasks assessed/performed ?Overall Cognitive Status: Within Functional Limits for tasks assessed ?  ?  ?  ?  ?  ?  ?  ?  ?  ?  ?  ?  ?  ?  ?  ?  ?  ?  ?  ?   ?Exercises   ? ?  ?Shoulder Instructions   ? ? ?  ?General Comments VSS. RN present throughout  ? ? ?  Pertinent Vitals/ Pain       Pain Assessment ?Pain Assessment: Faces ?Faces Pain Scale: Hurts a little bit ?Pain Location: groin ?Pain Descriptors / Indicators: Grimacing, Discomfort ?Pain Intervention(s): Monitored during session, Limited activity within patient's tolerance, Repositioned ? ?Home Living Family/patient expects to be discharged to:: Private residence ?Living Arrangements: Spouse/significant other ?Available Help at Discharge: Family;Available 24 hours/day ?Type of Home: House ?Home Access: Stairs to enter ?Entrance Stairs-Number of Steps: 3 ?Entrance Stairs-Rails: Can reach both ?Home Layout: One level ?  ?  ?Bathroom Shower/Tub: Gaffer;Tub/shower unit ?  ?Bathroom Toilet: Standard ?Bathroom Accessibility: No ?  ?Home Equipment: Rollator (4 wheels);Other (comment);Wheelchair - manual;Tub bench ?  ?Additional  Comments: Pt is caregiver for wife who has COPD; his son in law that lives with them in an amputee and has a tub bench and various DME ?  ? ?  ?Prior Functioning/Environment    ?  ?  ?  ?   ? ?Frequency ? Min 2X/week  ? ? ? ? ?  ?Progress Toward Goals ? ?OT Goals(current goals can now be found in the care plan section) ? Progress towards OT goals: Progressing toward goals ? ?Acute Rehab OT Goals ?Patient Stated Goal: Go home ?OT Goal Formulation: With patient ?Time For Goal Achievement: 01/03/22 ?Potential to Achieve Goals: Good ?ADL Goals ?Pt Will Perform Grooming: with supervision;standing ?Pt Will Perform Lower Body Dressing: with supervision;sit to/from stand;with adaptive equipment ?Pt Will Transfer to Toilet: with supervision;ambulating;regular height toilet ?Pt Will Perform Toileting - Clothing Manipulation and hygiene: with supervision;sitting/lateral leans;sit to/from stand  ?Plan Discharge plan remains appropriate   ? ?Co-evaluation ? ? ?   ?  ?  ?  ?  ? ?  ?AM-PAC OT "6 Clicks" Daily Activity     ?Outcome Measure ? ? Help from another person eating meals?: None ?Help from another person taking care of personal grooming?: A Little ?Help from another person toileting, which includes using toliet, bedpan, or urinal?: A Little ?Help from another person bathing (including washing, rinsing, drying)?: A Lot ?Help from another person to put on and taking off regular upper body clothing?: A Little ?Help from another person to put on and taking off regular lower body clothing?: A Lot ?6 Click Score: 17 ? ?  ?End of Session   ? ?OT Visit Diagnosis: Unsteadiness on feet (R26.81);Other abnormalities of gait and mobility (R26.89);Muscle weakness (generalized) (M62.81);Pain ?Pain - Right/Left: Right ?Pain - part of body: Leg ?  ?Activity Tolerance Treatment limited secondary to medical complications (Comment) ?  ?Patient Left in bed;with call bell/phone within reach ?  ?Nurse Communication Other (comment) (Significant  bleeding from R groin site and wound vac unplugged) ?  ? ?   ? ?Time: 6812-7517 ?OT Time Calculation (min): 11 min ? ?Charges: OT General Charges ?$OT Visit: 1 Visit ?OT Evaluation ?$OT Eval Moderate Complexity: 1 Mod ?OT Treatments ?$Self Care/Home Management : 8-22 mins ? ?Samadhi Mahurin MSOT, OTR/L ?Acute Rehab ?Pager: [phone] ?Office: [phone] ? ?Jessicca Stitzer M Alie Moudy ?12/20/2021, 2:09 PM ? ? ?

## 2021-12-20 NOTE — Progress Notes (Signed)
Mobility Specialist Progress Note ? ? 12/20/21 1126  ?Mobility  ?Activity Contraindicated/medical hold  ? ?Pt inappropriate for mobility specialist at this time given level of complexity, physical assist, and/or precautions as advised by RN. Mobility specialist to hold until afternoon, will continue to follow for readiness. ? ?Holland Falling ?Mobility Specialist ?Phone Number [phone] ? ?

## 2021-12-20 NOTE — Progress Notes (Signed)
[facility] [place] ? ?Subjective  - Called to bedside due to blood accumulation from right groin.   ? ? ?Objective ?136/69 ?69 ?98.3 ?F (36.8 ?C) (Oral) ?(!) 21 ?100% ? ?Intake/Output Summary (Last 24 hours) at 12/20/2021 1008 ?Last data filed at 12/20/2021 0735 ?Gross per 24 hour  ?Intake 740 ml  ?Output 1050 ml  ?Net -310 ml  ? ? ?Right groin with bloody drainage at base of wound, vein patch was not visualized.  Vac reapplied with good seal ?Left groin wet to dry dressing applied, no drainage or active bleeding. ?B LE warm and well perfused without ischemic changes ?Lungs non labored breathing ? ?Assessment/Planning: ?66 y.o. male is s/p Incision and debridement of bilateral groin wounds including skin and subcutaneous tissue and fat. #2: Placement of bilateral wound vacs (left dimensions or 7 x 6 x 6 cm.  Right was 6 x 5 x 4 cm)  4 Days Post-Op  ? ?Right groin healing well with re application of vac.   ?Left groin wet to dry dressing changed ?Blood loss 75 cc approximately per RN.  Will monitor HGB, labs ordered for am.  HGB 8.3  this am ?Cr 2.35 urine OP >1000 ml last 24 hours will cont. To observe ?Pending further debridement verse current dressing to B groins.  ? ? ? ? ? ? ?Kevin Gross ?12/20/2021 ?10:08 AM ?-- ? ?Laboratory ?Lab Results: ?Recent Labs  ?  12/19/21 ?0451 12/20/21 ?7619  ?WBC 7.3 8.9  ?HGB 8.6* 8.3*  ?HCT 28.0* 26.1*  ?PLT 334 278  ? ?BMET ?Recent Labs  ?  12/19/21 ?0451 12/20/21 ?5093  ?NA 139 137  ?K 4.3 3.8  ?CL 106 105  ?CO2 24 23  ?GLUCOSE 105* 113*  ?BUN 37* 34*  ?CREATININE 2.28* 2.35*  ?CALCIUM 9.4 9.0  ? ? ?COAG ?Lab Results  ?Component Value Date  ? INR 1.2 12/13/2021  ? INR 1.0 11/23/2021  ? ?No results found for: PTT ? ? ? ?

## 2021-12-21 DIAGNOSIS — N179 Acute kidney failure, unspecified: Secondary | ICD-10-CM | POA: Diagnosis not present

## 2021-12-21 DIAGNOSIS — I10 Essential (primary) hypertension: Secondary | ICD-10-CM | POA: Diagnosis not present

## 2021-12-21 DIAGNOSIS — T8149XA Infection following a procedure, other surgical site, initial encounter: Secondary | ICD-10-CM | POA: Diagnosis not present

## 2021-12-21 DIAGNOSIS — R7303 Prediabetes: Secondary | ICD-10-CM | POA: Diagnosis not present

## 2021-12-21 LAB — GLUCOSE, CAPILLARY
Glucose-Capillary: 108 mg/dL — ABNORMAL HIGH (ref 70–99)
Glucose-Capillary: 108 mg/dL — ABNORMAL HIGH (ref 70–99)
Glucose-Capillary: 149 mg/dL — ABNORMAL HIGH (ref 70–99)
Glucose-Capillary: 128 mg/dL — ABNORMAL HIGH (ref 70–99)

## 2021-12-21 LAB — BASIC METABOLIC PANEL
Anion gap: 9 (ref 5–15)
BUN: 36 mg/dL — ABNORMAL HIGH (ref 8–23)
CO2: 23 mmol/L (ref 22–32)
Calcium: 9 mg/dL (ref 8.9–10.3)
Chloride: 104 mmol/L (ref 98–111)
Creatinine, Ser: 2.08 mg/dL — ABNORMAL HIGH (ref 0.61–1.24)
GFR, Estimated: 35 mL/min — ABNORMAL LOW (ref >60)
Glucose, Bld: 129 mg/dL — ABNORMAL HIGH (ref 70–99)
Potassium: 4.1 mmol/L (ref 3.5–5.1)
Sodium: 136 mmol/L (ref 135–145)

## 2021-12-21 LAB — CBC
HCT: 25.7 % — ABNORMAL LOW (ref 39.0–52.0)
Hemoglobin: 8 g/dL — ABNORMAL LOW (ref 13.0–17.0)
MCH: 30.4 pg (ref 26.0–34.0)
MCHC: 31.1 g/dL (ref 30.0–36.0)
MCV: 97.7 fL (ref 80.0–100.0)
Platelets: 229 10*3/uL (ref 150–400)
RBC: 2.63 MIL/uL — ABNORMAL LOW (ref 4.22–5.81)
RDW: 19.3 % — ABNORMAL HIGH (ref 11.5–15.5)
WBC: 8.4 10*3/uL (ref 4.0–10.5)
nRBC: 0 % (ref 0.0–0.2)

## 2021-12-21 NOTE — Progress Notes (Addendum)
Physical Therapy Treatment ?Patient Details ?Name: Kevin Gross ?MRN: [id number] ?DOB: 06-20-1956 ?Today's Date: 12/21/2021 ? ? ?History of Present Illness 66 y.o. M who presents 12/12/2021 with infected post surgical lymphcele bilateral groinds. Underwent wound washout on 4/18 and I&D on 4/20. Significant PMH: alcohol related cirrhosis, COPD, heart failure, obesity, HTN, bifemoral endarterectomy on 3/31. ? ?  ?PT Comments  ? ? Pt received in supine, agreeable to physical therapy session with emphasis on gait training. Pain appeared to affect activity tolerance with HR up to 145 bpm while standing. Pt reported decrease in pain after taking a sitting break for a few minutes in the recliner. Pt progressed gait distance up to 70 feet with min guard assist and chair follow for safety. Pt agreeable to sit up in chair at end of session. Plan for next session is to increase gait distance and stairs training to increase pts mobility and functional independence. Pt continues to benefit from PT services to progress toward functional mobility goals.  ?  ?Recommendations for follow up therapy are one component of a multi-disciplinary discharge planning process, led by the attending physician.  Recommendations may be updated based on patient status, additional functional criteria and insurance authorization. ? ?Follow Up Recommendations ? Home health PT ?  ?  ?Assistance Recommended at Discharge Intermittent Supervision/Assistance  ?Patient can return home with the following A little help with walking and/or transfers;A little help with bathing/dressing/bathroom;Assistance with cooking/housework;Assist for transportation;Help with stairs or ramp for entrance ?  ?Equipment Recommendations ? None recommended by PT  ?  ?Recommendations for Other Services   ? ? ?  ?Precautions / Restrictions Precautions ?Precautions: Fall;Other (comment) ?Precaution Comments: wound vac ?Restrictions ?Weight Bearing Restrictions: No ?RLE Weight  Bearing: Weight bearing as tolerated ?LLE Weight Bearing: Weight bearing as tolerated  ?  ? ?Mobility ? Bed Mobility ?Overal bed mobility: Needs Assist ?Bed Mobility: Rolling ?Rolling: Min guard ?  ?Supine to sit: Min guard ? ?  ?General bed mobility comments: Pt used bed rails for trunk raise.  ? ?Transfers ?Overall transfer level: Needs assistance ?Equipment used: Rolling walker (2 wheels) ?Transfers: Sit to/from Stand ?Sit to Stand: Min assist ?  ?  ?  ?  ?  ?General transfer comment: MinA to power up to stand and ambulated to the restroom. Min guard to ambulate in the hallway and back to chair. ?  ? ?Ambulation/Gait ?Ambulation/Gait assistance: Min guard ?Gait Distance (Feet): 85 Feet (15 feet to restroom, 70 feet in hallway back to chair) ?Assistive device: Rolling walker (2 wheels) ?Gait Pattern/deviations: Step-through pattern, Decreased stride length, Wide base of support ?Gait velocity: decreased ?  ?  ?General Gait Details: Utilizing wider BOS, min guard for safety, mildly flexed posture ?Noted pt tachycardic with short distance gait, pt provided with chair follow for safety and RN notified of elevated HR. ? ? ?  ?Balance Overall balance assessment: Needs assistance ?Sitting-balance support: No upper extremity supported, Feet supported ?Sitting balance-Leahy Scale: Good ?  ?  ?Standing balance support: Bilateral upper extremity supported, During functional activity, Reliant on assistive device for balance ?Standing balance-Leahy Scale: Good with RW support ?Standing balance comment: Reliant on RW ?  ?  ?  ?  ?  ?  ?  ?  ?  ?  ?  ?  ? ?  ?Cognition Arousal/Alertness: Awake/alert ?Behavior During Therapy: [facility] for tasks assessed/performed ?Overall Cognitive Status: Within Functional Limits for tasks assessed ?  ?  ?  ?  ?  ?  ?  ?  ?  ?  ?  ?  ?  ?  ?  ?  ?  General Comments: very cooperative, good at following 1 and 2-step instructions. ?  ?  ? ?  ?Exercises General Exercises - Lower Extremity ?Ankle  Circles/Pumps: AROM, Both, 5 reps, Supine ?Short Arc Quad: AROM, Both, 5 reps, Supine ?Heel Slides: AROM, Both, 5 reps, Supine ? ?  ?General Comments   ?  ?  ? ?Pertinent Vitals/Pain Pain Assessment ?Pain Assessment: 0-10 ?Pain Score: 7 ?Pain Location: groin ?Pain Descriptors / Indicators: Sore ?Pain Intervention(s): Monitored during session  ? ? ? ?PT Goals (current goals can now be found in the care plan section) Acute Rehab PT Goals ?Patient Stated Goal: "go home and take care of my wife" ?PT Goal Formulation: With patient ?Time For Goal Achievement: 01/02/22 ?Potential to Achieve Goals: Good ?Progress towards PT goals: Progressing toward goals ? ?  ?Frequency ? ? ? Min 3X/week ? ? ? ?  ?PT Plan Current plan remains appropriate  ? ? ?   ?AM-PAC PT "6 Clicks" Mobility   ?Outcome Measure ? Help needed turning from your back to your side while in a flat bed without using bedrails?: A Little ?Help needed moving from lying on your back to sitting on the side of a flat bed without using bedrails?: A Little ?Help needed moving to and from a bed to a chair (including a wheelchair)?: A Little ?Help needed standing up from a chair using your arms (e.g., wheelchair or bedside chair)?: A Little ?Help needed to walk in hospital room?: A Little ?Help needed climbing 3-5 steps with a railing? : A Lot ?6 Click Score: 17 ? ?  ?End of Session Equipment Utilized During Treatment: Gait belt ?Activity Tolerance: Patient tolerated treatment well ?Patient left: in chair;with call bell/phone within reach ?Nurse Communication: Mobility status ?PT Visit Diagnosis: Unsteadiness on feet (R26.81);Other abnormalities of gait and mobility (R26.89);Muscle weakness (generalized) (M62.81);Difficulty in walking, not elsewhere classified (R26.2) ?  ? ? ?Time: 7943-2761 ?PT Time Calculation (min) (ACUTE ONLY): 36 min ? ?Charges:  $Gait Training: 8-22 mins ?$Therapeutic Activity: 8-22 mins          ?          ? ?Wadie Lessen, SPTA ? ? ? ?Tiffany  Mutch ?12/21/2021, 6:05 PM ? ?

## 2021-12-21 NOTE — Progress Notes (Signed)
?Progress Note ? ? ?Patient: Kevin Gross MHD:[id number] DOB: June 27, 1956 DOA: 12/12/2021     9 ?DOS: the patient was seen and examined on 12/21/2021 ?  ?Brief hospital course: ?Medical consultation.  ? ?Kevin Gross, 66 yo male with the past medical history of alcohol related cirrhosis, COPD, heart failure, dyslipidemia, obesity and hypertension developed wound infection following bifemoral endarterectomy on 03/31. He reported drainage from groin wounds more left than right, along with local bleeding and edema.  ?Admitted to the hospital on 12/12/21 with the working diagnosis of infected post surgical lymphocele bilateral groins.  ?Patient was placed on antibiotic therapy. ?Underwent wound washout on 04/18 and I&D on 04/20   ? ?Wound culture positive for MSSA, Klebseila and enterococcus fecalis.   ? ?04/21 PRBC transfused x2 for worsening anemia.  ? ?04/24 patient continue hemodynamically stable.  ?04/25 plan for further debridement in OR tomorrow  ? ?Assessment and Plan: ?* Wound infection after surgery ?Infected lymphocele of bilateral groins. ?04/18 bilateral groin wound washout debridement, antibiotic bead placement and VAC placement. ?04/20 I&D of bilateral groin wounds including skin and subcutaneous tissue fat.  ?Placement of bilateral wound vacs.  ? ?Culture positive for klebsiella, enterococcus and staphylococcus (MSSA).  ?Patient continue to be afebrile, no leukocytosis with today wbc at 8,4  ? ?Groin pain is well controlled with analgesics ? ?Antibiotic therapy with IV cefazolin. ?Continue wound vac to right groin and wet to dry to the left groin.   ?Out of bed to chair tid with meals.  ?Further debridement tomorrow in the OR per primary team.  ? ? ? ?Acute kidney injury superimposed on chronic kidney disease ([place]) ?Patient with no signs of hypervolemia.  ? ?Stable renal function with serum cr at 2,0 with K at 4,1 and serum bicarbonate at 23.  ?Can continue renal function monitoring every 48 hrs for now.   ?Avoid hypotension and nephrotoxic medications.   ? ? ? ?Pre-diabetes ?A1c was 5.6  ?Continue glucose cover and monitoring with insulin sliding scale.  ?Fasting glucose this am 129 mg/dl  ? ?Dyslipidemia, continue with statin therapy.  ? ?Essential hypertension ?Patient off antihypertensive agents with controlled blood pressure systolic 211 to 941 mmHg.  ?Continue close monitoring.  ? ?COPD (chronic obstructive pulmonary disease) ([place]) ?No acute exacerbation.  ? ? ?PAD (peripheral artery disease) ([place]) ?Continue with aspirin and clopidogrel ?Patient sp revascularization  ? ?PAF (paroxysmal atrial fibrillation) ([place]) ?Old records personally reviewed cardiology evaluation from 12/04/21. ?Patient with postoperative atrial fibrillation, complicated with acute blood loss anemia and requiring PRBC transfusion.  ?Recommendations to continue rate control with amiodarone and no anticoagulation for now.  ?Continue with aspirin and clopidogrel for ilac stents, ?Plan for possible post hospitalization 30 day monitor to consider anticoagulation if significant paroxysmal atrial fibrillation.  ? ? ? ?Alcoholic cirrhosis of liver without ascites ([place]) ?No clinical signs of withdrawal. ?Continue close neuro checks, out of bed to chair tid with meals.  ?Follow up with nutrition recommendations.  ? ?OSA (obstructive sleep apnea) ?-Continue CPAP ? ?Anemia ?Post operative anemia on anemia of chronic disease.  ?SP 2 units PRBC transfusion with good toleration. ? ?hgb has been stable at 8,0 range.  ? ?Class 3 obesity ([place]) ?-Body mass index is 44.85 kg/m?. ? ? ? ? ? ?  ? ?Subjective: Patient with no dyspnea or chest pain, wound pain is controlled with analgesics  ? ?Physical Exam: ?Vitals:  ? 12/21/21 0006 12/21/21 0513 12/21/21 0754 12/21/21 1116  ?BP: 138/70 109/78 129/62 118/70  ?  Pulse: 78 78 80 64  ?Resp: '15 18 14 12  '$ ?Temp: (P) 98 ?F (36.7 ?C)  97.9 ?F (36.6 ?C) 97.9 ?F (36.6 ?C)  ?TempSrc: (P) Oral Oral Oral Oral  ?SpO2: 98% 99% 98%  98%  ?Weight:      ?Height:      ? ?Neurology awake and alert ?ENT with mild pallor ?Cardiovascular with S1 and S2 present and rhythmic with no gallops, rubs or murmurs ?Respiratory with no wheezing or rales ?Abdomen not distended ?No lower extremity edema ?Wounds with dressing in place.  ?Data Reviewed: ? ? ? ?Family Communication: no family at the bedside  ? ?Disposition: ?Status is: Inpatient ?Remains inpatient appropriate because: IV antibiotics and complex wound care.  ? Planned Discharge Destination:  to be determined  ? ? ? ? ? ?Author: ?Tawni Millers, MD ?12/21/2021 3:01 PM ? ?For on call review [email or web address].  ?

## 2021-12-21 NOTE — Progress Notes (Signed)
?  Progress Note ? ? ? ?12/21/2021 ?7:47 AM ?5 Days Post-Op ? ?Subjective:  no complaints. Having some pain in left groin this morning. Says 7/10 ? ? ?Vitals:  ? 12/21/21 0006 12/21/21 0513  ?BP: 138/70 109/78  ?Pulse: 78 78  ?Resp: 15 18  ?Temp: (P) 98 ?F (36.7 ?C)   ?SpO2: 98% 99%  ? ?Physical Exam: ?Cardiac:  regular ?Lungs:  non labored ?Incisions:  Left groin incision dressed. Right groin with VAC to suction. Sanguinous drainage  ?Extremities:  BLE well perfused and warm ?Abdomen:  obese, soft ?Neurologic: alert and oriented ? ?CBC ?   ?Component Value Date/Time  ? WBC 8.4 12/21/2021 0029  ? RBC 2.63 (L) 12/21/2021 0029  ? HGB 8.0 (L) 12/21/2021 0029  ? HGB 13.3 10/12/2021 1314  ? HCT 25.7 (L) 12/21/2021 0029  ? HCT 39.4 10/12/2021 1314  ? PLT 229 12/21/2021 0029  ? PLT 175 10/12/2021 1314  ? MCV 97.7 12/21/2021 0029  ? MCV 97 10/12/2021 1314  ? MCH 30.4 12/21/2021 0029  ? MCHC 31.1 12/21/2021 0029  ? RDW 19.3 (H) 12/21/2021 0029  ? RDW 13.1 10/12/2021 1314  ? LYMPHSABS 0.9 12/12/2021 1702  ? LYMPHSABS 1.9 10/12/2021 1314  ? MONOABS 0.7 12/12/2021 1702  ? EOSABS 0.4 12/12/2021 1702  ? EOSABS 0.7 (H) 10/12/2021 1314  ? BASOSABS 0.0 12/12/2021 1702  ? BASOSABS 0.1 10/12/2021 1314  ? ? ?BMET ?   ?Component Value Date/Time  ? NA 136 12/21/2021 0029  ? NA 145 (H) 10/12/2021 1314  ? K 4.1 12/21/2021 0029  ? CL 104 12/21/2021 0029  ? CO2 23 12/21/2021 0029  ? GLUCOSE 129 (H) 12/21/2021 0029  ? BUN 36 (H) 12/21/2021 0029  ? BUN 37 (H) 10/12/2021 1314  ? CREATININE 2.08 (H) 12/21/2021 0029  ? CALCIUM 9.0 12/21/2021 0029  ? GFRNONAA 35 (L) 12/21/2021 0029  ? GFRAA 56 (L) 10/26/2019 0551  ? ? ?INR ?   ?Component Value Date/Time  ? INR 1.2 12/13/2021 0002  ? ? ? ?Intake/Output Summary (Last 24 hours) at 12/21/2021 0747 ?Last data filed at 12/21/2021 [phone] ?Gross per 24 hour  ?Intake 919.92 ml  ?Output 325 ml  ?Net 594.92 ml  ? ? ? ?Assessment/Plan:  66 y.o. male is s/p s/p Incision and debridement of bilateral groin wounds  including skin and subcutaneous tissue and fat. #2: Placement of bilateral wound vacs (left dimensions or 7 x 6 x 6 cm.  Right was 6 x 5 x 4 cm)  5 Days Post-Op  ? ?Left groin dressings c/d/I ?Right groin VAC to suction. Sanguinous drainage in VAC ?H&H stable 8/25 ?Scr 2.0 ?NPO after midnight ?Plan for further debridement in OR tomorrow 4/26 ? ?Karoline Caldwell, PA-C ?[facility] ?[phone] ?12/21/2021 ?7:47 AM ?

## 2021-12-21 NOTE — H&P (View-Only) (Signed)
?  Progress Note ? ? ? ?12/21/2021 ?7:47 AM ?5 Days Post-Op ? ?Subjective:  no complaints. Having some pain in left groin this morning. Says 7/10 ? ? ?Vitals:  ? 12/21/21 0006 12/21/21 0513  ?BP: 138/70 109/78  ?Pulse: 78 78  ?Resp: 15 18  ?Temp: (P) 98 ?F (36.7 ?C)   ?SpO2: 98% 99%  ? ?Physical Exam: ?Cardiac:  regular ?Lungs:  non labored ?Incisions:  Left groin incision dressed. Right groin with VAC to suction. Sanguinous drainage  ?Extremities:  BLE well perfused and warm ?Abdomen:  obese, soft ?Neurologic: alert and oriented ? ?CBC ?   ?Component Value Date/Time  ? WBC 8.4 12/21/2021 0029  ? RBC 2.63 (L) 12/21/2021 0029  ? HGB 8.0 (L) 12/21/2021 0029  ? HGB 13.3 10/12/2021 1314  ? HCT 25.7 (L) 12/21/2021 0029  ? HCT 39.4 10/12/2021 1314  ? PLT 229 12/21/2021 0029  ? PLT 175 10/12/2021 1314  ? MCV 97.7 12/21/2021 0029  ? MCV 97 10/12/2021 1314  ? MCH 30.4 12/21/2021 0029  ? MCHC 31.1 12/21/2021 0029  ? RDW 19.3 (H) 12/21/2021 0029  ? RDW 13.1 10/12/2021 1314  ? LYMPHSABS 0.9 12/12/2021 1702  ? LYMPHSABS 1.9 10/12/2021 1314  ? MONOABS 0.7 12/12/2021 1702  ? EOSABS 0.4 12/12/2021 1702  ? EOSABS 0.7 (H) 10/12/2021 1314  ? BASOSABS 0.0 12/12/2021 1702  ? BASOSABS 0.1 10/12/2021 1314  ? ? ?BMET ?   ?Component Value Date/Time  ? NA 136 12/21/2021 0029  ? NA 145 (H) 10/12/2021 1314  ? K 4.1 12/21/2021 0029  ? CL 104 12/21/2021 0029  ? CO2 23 12/21/2021 0029  ? GLUCOSE 129 (H) 12/21/2021 0029  ? BUN 36 (H) 12/21/2021 0029  ? BUN 37 (H) 10/12/2021 1314  ? CREATININE 2.08 (H) 12/21/2021 0029  ? CALCIUM 9.0 12/21/2021 0029  ? GFRNONAA 35 (L) 12/21/2021 0029  ? GFRAA 56 (L) 10/26/2019 0551  ? ? ?INR ?   ?Component Value Date/Time  ? INR 1.2 12/13/2021 0002  ? ? ? ?Intake/Output Summary (Last 24 hours) at 12/21/2021 0747 ?Last data filed at 12/21/2021 [phone] ?Gross per 24 hour  ?Intake 919.92 ml  ?Output 325 ml  ?Net 594.92 ml  ? ? ? ?Assessment/Plan:  66 y.o. male is s/p s/p Incision and debridement of bilateral groin wounds  including skin and subcutaneous tissue and fat. #2: Placement of bilateral wound vacs (left dimensions or 7 x 6 x 6 cm.  Right was 6 x 5 x 4 cm)  5 Days Post-Op  ? ?Left groin dressings c/d/I ?Right groin VAC to suction. Sanguinous drainage in VAC ?H&H stable 8/25 ?Scr 2.0 ?NPO after midnight ?Plan for further debridement in OR tomorrow 4/26 ? ?Karoline Caldwell, PA-C ?[facility] ?[phone] ?12/21/2021 ?7:47 AM ?

## 2021-12-21 NOTE — Consult Note (Addendum)
WOC Nurse Consult Note: ?Patient receiving care in [place] ?Reason for Consult: wound vac B groins starting Friday 12/23/21 ?Added to [facility] FU list and will order supplies for Friday AM. ?Black foam and white foam ordered to be placed in the patient room. ? ?Cathlean Marseilles. Tamala Julian, MSN, RN, CMSRN, AGCNS, WTA ?Wound Treatment Associate ?Pager [phone]  ? ? ? ?  ?

## 2021-12-22 ENCOUNTER — Inpatient Hospital Stay (HOSPITAL_COMMUNITY): Payer: Medicare PPO | Admitting: Anesthesiology

## 2021-12-22 ENCOUNTER — Other Ambulatory Visit: Payer: Self-pay

## 2021-12-22 ENCOUNTER — Encounter (HOSPITAL_COMMUNITY)
Admission: EM | Disposition: A | Discharge status: Home Health | Payer: Self-pay | Source: Home / Self Care | Attending: Surgery

## 2021-12-22 DIAGNOSIS — T8149XA Infection following a procedure, other surgical site, initial encounter: Secondary | ICD-10-CM | POA: Diagnosis not present

## 2021-12-22 DIAGNOSIS — I2510 Atherosclerotic heart disease of native coronary artery without angina pectoris: Secondary | ICD-10-CM

## 2021-12-22 DIAGNOSIS — I110 Hypertensive heart disease with heart failure: Secondary | ICD-10-CM

## 2021-12-22 DIAGNOSIS — S31109A Unspecified open wound of abdominal wall, unspecified quadrant without penetration into peritoneal cavity, initial encounter: Secondary | ICD-10-CM

## 2021-12-22 DIAGNOSIS — I509 Heart failure, unspecified: Secondary | ICD-10-CM

## 2021-12-22 HISTORY — PX: WOUND DEBRIDEMENT: SHX247

## 2021-12-22 HISTORY — PX: ALLOGRAFT APPLICATION: SHX6404

## 2021-12-22 LAB — GLUCOSE, CAPILLARY
Glucose-Capillary: 111 mg/dL — ABNORMAL HIGH (ref 70–99)
Glucose-Capillary: 99 mg/dL (ref 70–99)
Glucose-Capillary: 168 mg/dL — ABNORMAL HIGH (ref 70–99)
Glucose-Capillary: 240 mg/dL — ABNORMAL HIGH (ref 70–99)
Glucose-Capillary: 205 mg/dL — ABNORMAL HIGH (ref 70–99)

## 2021-12-22 SURGERY — DEBRIDEMENT, WOUND
Anesthesia: General | Site: Groin | Laterality: Bilateral

## 2021-12-22 MED ORDER — HYDROMORPHONE HCL 1 MG/ML IJ SOLN: 0.2500 mg | INTRAMUSCULAR | Status: DC | PRN

## 2021-12-22 MED ORDER — 0.9 % SODIUM CHLORIDE (POUR BTL) OPTIME
TOPICAL | Status: DC | PRN
  Administered 2021-12-22: 1000 mL

## 2021-12-22 MED ORDER — PHENYLEPHRINE 80 MCG/ML (10ML) SYRINGE FOR IV PUSH (FOR BLOOD PRESSURE SUPPORT)
PREFILLED_SYRINGE | INTRAVENOUS | Status: DC | PRN
  Administered 2021-12-22: 240 ug via INTRAVENOUS
  Administered 2021-12-22 (×2): 160 ug via INTRAVENOUS
  Administered 2021-12-22: 240 ug via INTRAVENOUS

## 2021-12-22 MED ORDER — SODIUM CHLORIDE 0.9 % IR SOLN
Status: DC | PRN
  Administered 2021-12-22: 3000 mL

## 2021-12-22 MED ORDER — SUGAMMADEX SODIUM 200 MG/2ML IV SOLN
INTRAVENOUS | Status: DC | PRN
  Administered 2021-12-22: 300 mg via INTRAVENOUS

## 2021-12-22 MED ORDER — DEXAMETHASONE SODIUM PHOSPHATE 10 MG/ML IJ SOLN
INTRAMUSCULAR | Status: DC | PRN
  Administered 2021-12-22: 10 mg via INTRAVENOUS

## 2021-12-22 MED ORDER — MIDAZOLAM HCL 2 MG/2ML IJ SOLN
INTRAMUSCULAR | Status: AC
  Filled 2021-12-22: qty 2

## 2021-12-22 MED ORDER — LIDOCAINE 2% (20 MG/ML) 5 ML SYRINGE
INTRAMUSCULAR | Status: DC | PRN
  Administered 2021-12-22: 100 mg via INTRAVENOUS

## 2021-12-22 MED ORDER — PROPOFOL 10 MG/ML IV BOLUS
INTRAVENOUS | Status: AC
  Filled 2021-12-22: qty 20

## 2021-12-22 MED ORDER — FENTANYL CITRATE (PF) 250 MCG/5ML IJ SOLN
INTRAMUSCULAR | Status: DC | PRN
  Administered 2021-12-22: 150 ug via INTRAVENOUS

## 2021-12-22 MED ORDER — LACTATED RINGERS IV SOLN: INTRAVENOUS | Status: DC | PRN

## 2021-12-22 MED ORDER — PROPOFOL 10 MG/ML IV BOLUS
INTRAVENOUS | Status: DC | PRN
  Administered 2021-12-22: 150 mg via INTRAVENOUS

## 2021-12-22 MED ORDER — OXYCODONE HCL 5 MG/5ML PO SOLN: 5.0000 mg | Freq: Once | ORAL | Status: DC | PRN

## 2021-12-22 MED ORDER — PHENYLEPHRINE HCL-NACL 20-0.9 MG/250ML-% IV SOLN
INTRAVENOUS | Status: DC | PRN
  Administered 2021-12-22: 50 ug/min via INTRAVENOUS

## 2021-12-22 MED ORDER — ROCURONIUM BROMIDE 10 MG/ML (PF) SYRINGE
PREFILLED_SYRINGE | INTRAVENOUS | Status: DC | PRN
Start: 2021-12-22 — End: 2021-12-22
  Administered 2021-12-22: 60 mg via INTRAVENOUS

## 2021-12-22 MED ORDER — ONDANSETRON HCL 4 MG/2ML IJ SOLN
INTRAMUSCULAR | Status: DC | PRN
  Administered 2021-12-22: 4 mg via INTRAVENOUS

## 2021-12-22 MED ORDER — OXYCODONE HCL 5 MG PO TABS: 5.0000 mg | ORAL_TABLET | Freq: Once | ORAL | Status: DC | PRN

## 2021-12-22 MED ORDER — EPHEDRINE SULFATE-NACL 50-0.9 MG/10ML-% IV SOSY
PREFILLED_SYRINGE | INTRAVENOUS | Status: DC | PRN
Start: 2021-12-22 — End: 2021-12-22
  Administered 2021-12-22: 10 mg via INTRAVENOUS

## 2021-12-22 MED ORDER — FENTANYL CITRATE (PF) 250 MCG/5ML IJ SOLN
INTRAMUSCULAR | Status: AC
  Filled 2021-12-22: qty 5

## 2021-12-22 MED ORDER — SODIUM CHLORIDE 0.9 % IV SOLN: INTRAVENOUS | Status: DC | PRN

## 2021-12-22 MED ORDER — ONDANSETRON HCL 4 MG/2ML IJ SOLN: 4.0000 mg | Freq: Once | INTRAMUSCULAR | Status: DC | PRN

## 2021-12-22 SURGICAL SUPPLY — 50 items
ADH SKN CLS APL DERMABOND .7 (GAUZE/BANDAGES/DRESSINGS) ×2
BAG COUNTER SPONGE SURGICOUNT (BAG) ×2 IMPLANT
BAG SPNG CNTER NS LX DISP (BAG) ×1
CANISTER SUCT 3000ML PPV (MISCELLANEOUS) ×2 IMPLANT
CANISTER WOUNDNEG PRESSURE 500 (CANNISTER) ×1 IMPLANT
CLIP VESOCCLUDE MED 6/CT (CLIP) ×2 IMPLANT
CLIP VESOCCLUDE SM WIDE 6/CT (CLIP) ×2 IMPLANT
CONNECTOR Y WND VAC (MISCELLANEOUS) IMPLANT
COVER SURGICAL LIGHT HANDLE (MISCELLANEOUS) ×2 IMPLANT
DERMABOND ADVANCED (GAUZE/BANDAGES/DRESSINGS) ×2
DERMABOND ADVANCED .7 DNX12 (GAUZE/BANDAGES/DRESSINGS) IMPLANT
DRAPE HALF SHEET 40X57 (DRAPES) IMPLANT
DRAPE U-SHAPE 76X120 STRL (DRAPES) IMPLANT
DRESSING VERAFLO CLEANS CC MED (GAUZE/BANDAGES/DRESSINGS) IMPLANT
DRSG VAC ATS MED SENSATRAC (GAUZE/BANDAGES/DRESSINGS) ×2 IMPLANT
DRSG VERAFLO CLEANSE CC MED (GAUZE/BANDAGES/DRESSINGS) ×2
ELECT REM PT RETURN 9FT ADLT (ELECTROSURGICAL) ×2
ELECTRODE REM PT RTRN 9FT ADLT (ELECTROSURGICAL) ×1 IMPLANT
GAUZE SPONGE 4X4 12PLY STRL (GAUZE/BANDAGES/DRESSINGS) ×2 IMPLANT
GAUZE XEROFORM 5X9 LF (GAUZE/BANDAGES/DRESSINGS) IMPLANT
GLOVE SURG SS PI 7.5 STRL IVOR (GLOVE) ×6 IMPLANT
GOWN STRL REUS W/ TWL LRG LVL3 (GOWN DISPOSABLE) ×2 IMPLANT
GOWN STRL REUS W/ TWL XL LVL3 (GOWN DISPOSABLE) ×1 IMPLANT
GOWN STRL REUS W/TWL LRG LVL3 (GOWN DISPOSABLE) ×4
GOWN STRL REUS W/TWL XL LVL3 (GOWN DISPOSABLE) ×2
GRAFT SKIN WND OMEGA3 SB 7X10 (Tissue) ×2 IMPLANT
GRAFT SKIN WND SURGICLOSE M95 (Tissue) ×1 IMPLANT
HANDPIECE INTERPULSE COAX TIP (DISPOSABLE)
IV NS IRRIG 3000ML ARTHROMATIC (IV SOLUTION) ×2 IMPLANT
KIT BASIN OR (CUSTOM PROCEDURE TRAY) ×2 IMPLANT
KIT TURNOVER KIT B (KITS) ×2 IMPLANT
NS IRRIG 1000ML POUR BTL (IV SOLUTION) ×2 IMPLANT
PACK CV ACCESS (CUSTOM PROCEDURE TRAY) IMPLANT
PACK GENERAL/GYN (CUSTOM PROCEDURE TRAY) ×2 IMPLANT
PACK UNIVERSAL I (CUSTOM PROCEDURE TRAY) ×2 IMPLANT
PAD ARMBOARD 7.5X6 YLW CONV (MISCELLANEOUS) ×4 IMPLANT
PAD NEG PRESSURE SENSATRAC (MISCELLANEOUS) ×2 IMPLANT
PULSAVAC PLUS IRRIG FAN TIP (DISPOSABLE) ×2
SET HNDPC FAN SPRY TIP SCT (DISPOSABLE) IMPLANT
STAPLER VISISTAT 35W (STAPLE) ×1 IMPLANT
SUT ETHILON 3 0 PS 1 (SUTURE) IMPLANT
SUT VIC AB 2-0 CTX 36 (SUTURE) IMPLANT
SUT VIC AB 3-0 SH 27 (SUTURE)
SUT VIC AB 3-0 SH 27X BRD (SUTURE) IMPLANT
SUT VICRYL 4-0 PS2 18IN ABS (SUTURE) IMPLANT
SYR 10ML LL (SYRINGE) ×1 IMPLANT
TIP FAN IRRIG PULSAVAC PLUS (DISPOSABLE) IMPLANT
TOWEL GREEN STERILE (TOWEL DISPOSABLE) ×2 IMPLANT
WATER STERILE IRR 1000ML POUR (IV SOLUTION) ×2 IMPLANT
WND VAC CONN Y (MISCELLANEOUS) ×1

## 2021-12-22 NOTE — Anesthesia Postprocedure Evaluation (Signed)
Anesthesia Post Note ? ?Patient: Kevin Gross ? ?Procedure(s) Performed: INCISION AND DEBRIDEMENT OF BILATERAL GROINS (Bilateral: Groin) ?ALLOGRAFT APPLICATION (Bilateral: Groin) ? ?  ? ?Patient location during evaluation: PACU ?Anesthesia Type: General ?Level of consciousness: awake and alert ?Pain management: pain level controlled ?Vital Signs Assessment: post-procedure vital signs reviewed and stable ?Respiratory status: spontaneous breathing, nonlabored ventilation and respiratory function stable ?Cardiovascular status: blood pressure returned to baseline and stable ?Postop Assessment: no apparent nausea or vomiting ?Anesthetic complications: no ? ? ?No notable events documented. ? ?Last Vitals:  ?Vitals:  ? 12/22/21 0905 12/22/21 0920  ?BP: 96/68   ?Pulse: 73 66  ?Resp: 13 14  ?Temp:    ?SpO2: 95% 96%  ?  ?Last Pain:  ?Vitals:  ? 12/22/21 0905  ?TempSrc:   ?PainSc: 0-No pain  ? ? ?  ?  ?  ?  ?  ?  ? ?Brittany Osier A. ? ? ? ? ?

## 2021-12-22 NOTE — Anesthesia Preprocedure Evaluation (Addendum)
Anesthesia Evaluation  ?Patient identified by MRN, date of birth, ID band ?Patient awake ? ? ? ?Reviewed: ?Allergy & Precautions, NPO status , Patient's Chart, lab work & pertinent test results ? ?Airway ?Mallampati: III ? ?TM Distance: >3 FB ?Neck ROM: Full ? ? ? Dental ? ?(+) Loose, Missing, Dental Advisory Given,  ?  ?Pulmonary ?sleep apnea and Continuous Positive Airway Pressure Ventilation , COPD,  COPD inhaler, former smoker,  ?  ?breath sounds clear to auscultation ?+ decreased breath sounds ? ? ? ? ? Cardiovascular ?hypertension, Pt. on medications and Pt. on home beta blockers ?(-) angina+ CAD, + Peripheral Vascular Disease and +CHF  ? ?Rhythm:Regular Rate:Normal ? ??1. Left ventricular ejection fraction, by estimation, is 55 to 60%. The  ?left ventricle has normal function. The left ventricle has no regional  ?wall motion abnormalities. There is mild left ventricular hypertrophy.  ?Left ventricular diastolic parameters  ?are indeterminate.  ??2. Right ventricular systolic function is normal. The right ventricular  ?size is mildly enlarged.  ??3. The mitral valve is normal in structure. No evidence of mitral valve  ?regurgitation.  ??4. The aortic valve was not well visualized. Aortic valve regurgitation  ?is not visualized.  ??5. Aortic dilatation noted. 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  ??6. The inferior vena cava is normal in size with greater than 50%  ?respiratory variability, suggesting right atrial pressure of 3 mmHg.  ? ?S/P Bilateral femoral endarterectomies on 3/31 ?  ?Neuro/Psych ?negative neurological ROS ? negative psych ROS  ? GI/Hepatic ?negative GI ROS, (+) Cirrhosis  ?  ? substance abuse ? alcohol use,   ?Endo/Other  ?Morbid obesityHyperlipidemia ? Renal/GU ?Renal InsufficiencyRenal diseaseLab Results ?     Component                Value               Date                 ?     CREATININE               2.03 (H)             12/16/2021           ?  ?negative genitourinary ?  ?Musculoskeletal ?Bilateral infected groin wounds L>R after bilateral femoral endarterectomies  ? Abdominal ?(+) + obese,   ?Peds ? Hematology ? ?(+) Blood dyscrasia, anemia , Lab Results ?     Component                Value               Date                 ?     WBC                      6.3                 12/16/2021           ?     HGB                      7.6 (L)             12/16/2021           ?     HCT  24.3 (L)            12/16/2021           ?     MCV                      94.9                12/16/2021           ?     PLT                      417 (H)             12/16/2021           ?   ?Anesthesia Other Findings ? ? Reproductive/Obstetrics ? ?  ? ? ? ? ? ? ? ? ? ? ? ? ? ?  ?  ? ? ? ? ? ? ?Anesthesia Physical ? ?Anesthesia Plan ? ?ASA: 3 ? ?Anesthesia Plan: General  ? ?Post-op Pain Management: Ofirmev IV (intra-op)*, Dilaudid IV and Ketamine IV*  ? ?Induction: Intravenous ? ?PONV Risk Score and Plan: 3 and Ondansetron, Dexamethasone and Treatment may vary due to age or medical condition ? ?Airway Management Planned: Oral ETT and Video Laryngoscope Planned ? ?Additional Equipment: None ? ?Intra-op Plan:  ? ?Post-operative Plan: Extubation in OR ? ?Informed Consent: I have reviewed the patients History and Physical, chart, labs and discussed the procedure including the risks, benefits and alternatives for the proposed anesthesia with the patient or authorized representative who has indicated his/her understanding and acceptance.  ? ? ? ?Dental advisory given ? ?Plan Discussed with: CRNA and Anesthesiologist ? ?Anesthesia Plan Comments:   ? ? ? ? ? ?Anesthesia Quick Evaluation ? ?

## 2021-12-22 NOTE — Op Note (Signed)
? ? ?  Patient name: Kevin Gross MRN: [id number] DOB: May 01, 1956 Sex: male ? ?12/22/2021 ?Pre-operative Diagnosis: Bilateral groin wounds ?Post-operative diagnosis:  Same ?Surgeon:  Annamarie Major ?Assistants:  Arlee Muslim ?Procedure:   #1: Incision and debridement of bilateral groin wounds including skin, subcutaneous tissue, and fat ?:  #2: Application of first 093 cm? of xenograft, bilateral groin ?  #3: Placement of bilateral wound VAC ?Anesthesia:  General ?Blood Loss:  minimal ?Specimens:  none ? ?Findings: Tissue bed appeared much healthier today.  Wound measurements were essentially the same.  The left was 7 x 6 x 6 cm.  The right was 6 x 5 x 4 cm ? ?Indications: This is a 66 year old gentleman who underwent revascularization through bilateral groin incisions which have separated.  We have been performing dressing changes and wound VAC changes.  He is back today for further evaluation under anesthesia. ? ?Procedure:  The patient was identified in the holding area and taken to [place] 12  The patient was then placed supine on the table. epidural anesthesia was administered.  The patient was prepped and draped in the usual sterile fashion.  A time out was called and antibiotics were administered.  A PA was necessary to expedite the procedure and assist with technical details. ? ?The previous dressings were removed.  The tissue appeared much healthier.  I did take a 10 blade and debride superficial slough including skin soft tissue and fat bilaterally.  I then used the Pulsavac irrigator and irrigated 3 L of saline.  There was hematoma in both wounds that was evacuated.  I then used the Kerecis fish powder and placed it into each groin.  I used a 95 cm? bag and splinted in both groins.  I then used the 7 x 10 for skin bilateral leg and stapled this into position.  Blue sponge wound vacs were placed with good seal.  There were no immediate complications. ? ? ?Disposition: To PACU stable. ? ? ?V. Annamarie Major, M.D., FACS ?[facility] [place] ?Office: [phone] ?Pager:  [phone]  ?

## 2021-12-22 NOTE — Progress Notes (Signed)
?Progress Note ? ? ?Patient: Kevin Gross TZG:[id number] DOB: 18-Sep-1955 DOA: 12/12/2021     10 ?DOS: the patient was seen and examined on 12/22/2021 ?  ?Brief hospital course: ?Medical consultation.  ? ?Mr. Mcgranahan, 66 yo male with the past medical history of alcohol related cirrhosis, COPD, heart failure, dyslipidemia, obesity and hypertension developed wound infection following bifemoral endarterectomy on 03/31. He reported drainage from groin wounds more left than right, along with local bleeding and edema.  ?Admitted to the hospital on 12/12/21 with the working diagnosis of infected post surgical lymphocele bilateral groins.  ?Patient was placed on antibiotic therapy. ?Underwent wound washout on 04/18 and I&D on 04/20   ? ?Wound culture positive for MSSA, Klebseila and enterococcus fecalis.   ? ?04/21 PRBC transfused x2 for worsening anemia.  ? ?04/24 patient continue hemodynamically stable.  ?04/25 plan for further debridement in OR tomorrow  ? ?Assessment and Plan: ?* Wound infection after surgery ?Infected lymphocele of bilateral groins. ?04/18 bilateral groin wound washout debridement, antibiotic bead placement and VAC placement. ?04/20 I&D of bilateral groin wounds including skin and subcutaneous tissue fat.  ?Placement of bilateral wound vacs.  ? ?Culture positive for klebsiella, enterococcus and staphylococcus (MSSA).  ?Patient continue to be afebrile, no leukocytosis with today wbc at 8,4  ? ?Groin pain is well controlled with analgesics ? ?Antibiotic therapy with IV cefazolin. ?Continue wound vac to right groin and wet to dry to the left groin.   ?Out of bed to chair tid with meals.  ?Further debridement tomorrow in the OR per primary team. ?It appears he may be stabilizing from vascular perspective for discharge? ? ?Acute kidney injury superimposed on chronic kidney disease ([place]) ?Patient with no signs of hypervolemia.  ? ?Stable renal function with serum cr at 2,0 with K at 4,1 and serum bicarbonate at  23.  ?Can continue renal function monitoring every 48 hrs for now.  ?Avoid hypotension and nephrotoxic medications.   ? ?Pre-diabetes ?A1c was 5.6  ?Continue glucose cover and monitoring with insulin sliding scale.  ?Fasting glucose this am 129 mg/dl  ? ?Dyslipidemia, continue with statin therapy.  ? ?Essential hypertension ?Patient off antihypertensive agents with controlled blood pressure systolic 759 to 163 mmHg.  ?Continue close monitoring.  ? ?COPD (chronic obstructive pulmonary disease) ([place]) ?No acute exacerbation.  ? ? ?PAD (peripheral artery disease) ([place]) ?Continue with aspirin and clopidogrel ?Patient sp revascularization  ? ?PAF (paroxysmal atrial fibrillation) ([place]) ?Reviewed cardiology evaluation from 12/04/21. ?Patient with postoperative atrial fibrillation, complicated with acute blood loss anemia and requiring PRBC transfusion.  ?Recommendations to continue rate control with amiodarone and no anticoagulation for now.  ?Continue with aspirin and clopidogrel for ilac stents, ?Plan for possible post hospitalization 30 day monitor to consider anticoagulation if significant paroxysmal atrial fibrillation.  ? ?Alcoholic cirrhosis of liver without ascites ([place]) ?No clinical signs of withdrawal. ?Continue close neuro checks, out of bed to chair tid with meals.  ?Follow up with nutrition recommendations.  ? ?OSA (obstructive sleep apnea) ?-Continue CPAP ? ?Anemia ?Post operative anemia on anemia of chronic disease.  ?SP 2 units PRBC transfusion with good toleration. ? ?hgb has been stable at 8,0 range.  ? ?Class 3 obesity ([place]) ?-Body mass index is 44.85 kg/m?. ? ? ? ? ? ?  ? ?Subjective: Pain is 5 on 10 otherwise he is fine ? ?Physical Exam: ?Vitals:  ? 12/22/21 0920 12/22/21 0935 12/22/21 0953 12/22/21 1600  ?BP: (!) 116/54 119/66 (!) 113/49 124/72  ?Pulse: 66  71 77 89  ?Resp: '14 14 19 18  '$ ?Temp: 98.5 ?F (36.9 ?C) 98.5 ?F (36.9 ?C) (!) 97.5 ?F (36.4 ?C) 98 ?F (36.7 ?C)  ?TempSrc:   Oral Oral  ?SpO2: 96% 99%  98% 97%  ?Weight:      ?Height:      ? ?Awake coherent no distress EOMI NCAT no focal deficit thick neck Mallampati 4 ?Abdomen this somewhat distended he has some anasarca he has VAC in the lower groin ?S1-S2 no murmur ?Chest is clear no added sound ? ? ? ?Family Communication: no family at the bedside  ? ?Disposition: ?Status is: Inpatient ?Remains inpatient appropriate because: IV antibiotics and complex wound care.  ? Planned Discharge Destination:  to be determined  ? ? ? ? ? ?Author: ?Nita Sells, MD ?12/22/2021 6:24 PM ? ?For on call review [email or web address].  ?

## 2021-12-22 NOTE — Transfer of Care (Signed)
Immediate Anesthesia Transfer of Care Note ? ?Patient: Kevin Gross ? ?Procedure(s) Performed: INCISION AND DEBRIDEMENT OF BILATERAL GROINS (Bilateral: Groin) ?ALLOGRAFT APPLICATION (Bilateral: Groin) ? ?Patient Location: PACU ? ?Anesthesia Type:General ? ?Level of Consciousness: awake ? ?Airway & Oxygen Therapy: Patient Spontanous Breathing ? ?Post-op Assessment: Report given to RN and Post -op Vital signs reviewed and stable ? ?Post vital signs: Reviewed and stable ? ?Last Vitals:  ?Vitals Value Taken Time  ?BP    ?Temp    ?Pulse    ?Resp 11 12/22/21 0851  ?SpO2    ?Vitals shown include unvalidated device data. ? ?Last Pain:  ?Vitals:  ? 12/22/21 0516  ?TempSrc: Axillary  ?PainSc:   ?   ? ?Patients Stated Pain Goal: 2 (12/21/21 1938) ? ?Complications: No notable events documented. ?

## 2021-12-22 NOTE — Anesthesia Procedure Notes (Signed)
Procedure Name: Intubation ?Date/Time: 12/22/2021 7:46 AM ?Performed by: Vonna Drafts, CRNA ?Pre-anesthesia Checklist: Patient identified, Emergency Drugs available, Suction available and Patient being monitored ?Patient Re-evaluated:Patient Re-evaluated prior to induction ?Oxygen Delivery Method: Circle system utilized ?Preoxygenation: Pre-oxygenation with 100% oxygen ?Induction Type: IV induction ?Ventilation: Oral airway inserted - appropriate to patient size and Two handed mask ventilation required ?Laryngoscope Size: Glidescope and 4 ?Grade View: Grade I ?Tube type: Oral ?Tube size: 7.5 mm ?Number of attempts: 1 ?Airway Equipment and Method: Stylet and Oral airway ?Placement Confirmation: ETT inserted through vocal cords under direct vision, positive ETCO2 and breath sounds checked- equal and bilateral ?Secured at: 23 cm ?Tube secured with: Tape ?Dental Injury: Teeth and Oropharynx as per pre-operative assessment  ? ? ? ? ?

## 2021-12-22 NOTE — Progress Notes (Signed)
Pt came back to rm 6 from PACU. Reinitiated tele. VSS. Call bell within reach.  ? ?Lavenia Atlas, RN ? ?

## 2021-12-22 NOTE — Progress Notes (Incomplete)
?PROGRESS NOTE ? ? ?Kevin Gross  GQQ:[id number] DOB: 1956-03-11 DOA: 12/12/2021 ?PCP: Rutherford Limerick, PA  ?Brief Narrative:  ?66 year old obese white male ?EtOH cirrhosis HFpEF HTN pre-DM BMI 44.6 ?Transient A-fib in the past ?OSA on CPAP ?HLD ?COPD ? ?Patient was recently admitted to the vascular service 4/10 through 12/09/2021 with extensive iliofemoral disease and because of his advanced comorbidities had poor candidacy for aortobifemoral bypass grafting-he underwent several procedures 3/31 ?Was intubated and was managed on the ventilator for several days ?He developed postoperative atrial fibrillation which self resolved was kept on IV heparin ?He had good perfusion and left postop day 10 with home health PT OT RN through [facility] was discharged home on amiodarone 200 twice daily Plavix 75 and Silvadene for his IV wound sites ?He presented back to the emergency room 4/16 with a small amount of clear drainage from incisions without fever and chills and his legs were improved however it was felt that there was concern for infection deeper ? ?He was admitted by VVS and Hospitalist service was consulted ? ? ?Hospital-Problem based course ? ? ? ?DVT prophylaxis:  ?Code Status:  ?Family Communication:  ?Disposition:  ?Status is: Inpatient ?{Inpatient:23812} ?  ?Consultants:  ?*** ? ?Procedures: *** ? ?Antimicrobials: ***  ? ? ?Subjective: ?*** ? ?Objective: ?Vitals:  ? 12/22/21 6712 12/22/21 0920 12/22/21 0935 12/22/21 0953  ?BP: 96/68 (!) 116/54 119/66 (!) 113/49  ?Pulse: 73 66 71 77  ?Resp: '13 14 14 19  '$ ?Temp:  98.5 ?F (36.9 ?C) 98.5 ?F (36.9 ?C) (!) 97.5 ?F (36.4 ?C)  ?TempSrc:    Oral  ?SpO2: 95% 96% 99% 98%  ?Weight:      ?Height:      ? ? ?Intake/Output Summary (Last 24 hours) at 12/22/2021 1346 ?Last data filed at 12/22/2021 1000 ?Gross per 24 hour  ?Intake 1190 ml  ?Output 1325 ml  ?Net -135 ml  ? ?Filed Weights  ? 12/13/21 1108 12/18/21 0342  ?Weight: 133.8 kg 133.1 kg  ? ? ?Examination: ? ? ? ?Data  Reviewed: personally reviewed  ? ?CBC ?   ?Component Value Date/Time  ? WBC 8.4 12/21/2021 0029  ? RBC 2.63 (L) 12/21/2021 0029  ? HGB 8.0 (L) 12/21/2021 0029  ? HGB 13.3 10/12/2021 1314  ? HCT 25.7 (L) 12/21/2021 0029  ? HCT 39.4 10/12/2021 1314  ? PLT 229 12/21/2021 0029  ? PLT 175 10/12/2021 1314  ? MCV 97.7 12/21/2021 0029  ? MCV 97 10/12/2021 1314  ? MCH 30.4 12/21/2021 0029  ? MCHC 31.1 12/21/2021 0029  ? RDW 19.3 (H) 12/21/2021 0029  ? RDW 13.1 10/12/2021 1314  ? LYMPHSABS 0.9 12/12/2021 1702  ? LYMPHSABS 1.9 10/12/2021 1314  ? MONOABS 0.7 12/12/2021 1702  ? EOSABS 0.4 12/12/2021 1702  ? EOSABS 0.7 (H) 10/12/2021 1314  ? BASOSABS 0.0 12/12/2021 1702  ? BASOSABS 0.1 10/12/2021 1314  ? ? ?  Latest Ref Rng & Units 12/21/2021  ? 12:29 AM 12/20/2021  ?  2:55 AM 12/19/2021  ?  4:51 AM  ?CMP  ?Glucose 70 - 99 mg/dL 129   113   105    ?BUN 8 - 23 mg/dL 36   34   37    ?Creatinine 0.61 - 1.24 mg/dL 2.08   2.35   2.28    ?Sodium 135 - 145 mmol/L 136   137   139    ?Potassium 3.5 - 5.1 mmol/L 4.1   3.8   4.3    ?  Chloride 98 - 111 mmol/L 104   105   106    ?CO2 22 - 32 mmol/L '23   23   24    '$ ?Calcium 8.9 - 10.3 mg/dL 9.0   9.0   9.4    ? ? ? ?Radiology Studies: ?No results found. ? ? ?Scheduled Meds: ? allopurinol  300 mg Oral Daily  ? amiodarone  200 mg Oral Daily  ? aspirin EC  81 mg Oral Daily  ? cholecalciferol  2,000 Units Oral Daily  ? clopidogrel  75 mg Oral Daily  ? enoxaparin (LOVENOX) injection  70 mg Subcutaneous Q24H  ? ezetimibe  10 mg Oral Daily  ? feeding supplement  237 mL Oral BID BM  ? insulin aspart  0-6 Units Subcutaneous TID WC  ? multivitamin with minerals  1 tablet Oral Daily  ? pantoprazole  40 mg Oral Daily  ? polyethylene glycol  17 g Oral Daily  ? senna-docusate  1 tablet Oral BID  ? simvastatin  20 mg Oral QPM  ? sodium chloride flush  3 mL Intravenous Q12H  ? tamsulosin  0.4 mg Oral QPC supper  ? zolpidem  10 mg Oral QHS  ? ?Continuous Infusions: ?  ceFAZolin (ANCEF) IV 2 g (12/22/21 1230)   ? ? ? LOS: 10 days  ? ?Time spent: *** ? ?Nita Sells, MD ?[facility] ?To contact the attending provider between 7A-7P or the covering provider during after hours 7P-7A, please log into the web site [email or web address] and access using universal [place] password for that web site. If you do not have the password, please call the hospital operator. ? ?12/22/2021, 1:46 PM  ? ? ?

## 2021-12-22 NOTE — Interval H&P Note (Signed)
History and Physical Interval Note: ? ?12/22/2021 ?7:29 AM ? ?Kevin Gross  has presented today for surgery, with the diagnosis of Bilateral groin wounds.  The various methods of treatment have been discussed with the patient and family. After consideration of risks, benefits and other options for treatment, the patient has consented to  Procedure(s): ?INCISION AND DEBRIDEMENT OF BILATERAL GROINS (Bilateral) as a surgical intervention.  The patient's history has been reviewed, patient examined, no change in status, stable for surgery.  I have reviewed the patient's chart and labs.  Questions were answered to the patient's satisfaction.   ? ? ?[name] ? ? ?

## 2021-12-23 DIAGNOSIS — T8149XA Infection following a procedure, other surgical site, initial encounter: Secondary | ICD-10-CM | POA: Diagnosis not present

## 2021-12-23 LAB — GLUCOSE, CAPILLARY
Glucose-Capillary: 176 mg/dL — ABNORMAL HIGH (ref 70–99)
Glucose-Capillary: 163 mg/dL — ABNORMAL HIGH (ref 70–99)
Glucose-Capillary: 164 mg/dL — ABNORMAL HIGH (ref 70–99)
Glucose-Capillary: 138 mg/dL — ABNORMAL HIGH (ref 70–99)

## 2021-12-23 LAB — BASIC METABOLIC PANEL
Anion gap: 6 (ref 5–15)
BUN: 40 mg/dL — ABNORMAL HIGH (ref 8–23)
CO2: 25 mmol/L (ref 22–32)
Calcium: 8.8 mg/dL — ABNORMAL LOW (ref 8.9–10.3)
Chloride: 106 mmol/L (ref 98–111)
Creatinine, Ser: 2.16 mg/dL — ABNORMAL HIGH (ref 0.61–1.24)
GFR, Estimated: 33 mL/min — ABNORMAL LOW (ref >60)
Glucose, Bld: 143 mg/dL — ABNORMAL HIGH (ref 70–99)
Potassium: 4.8 mmol/L (ref 3.5–5.1)
Sodium: 137 mmol/L (ref 135–145)

## 2021-12-23 LAB — CBC
HCT: 21.5 % — ABNORMAL LOW (ref 39.0–52.0)
Hemoglobin: 6.9 g/dL — CL (ref 13.0–17.0)
MCH: 31.8 pg (ref 26.0–34.0)
MCHC: 32.1 g/dL (ref 30.0–36.0)
MCV: 99.1 fL (ref 80.0–100.0)
Platelets: 222 10*3/uL (ref 150–400)
RBC: 2.17 MIL/uL — ABNORMAL LOW (ref 4.22–5.81)
RDW: 19 % — ABNORMAL HIGH (ref 11.5–15.5)
WBC: 8.3 10*3/uL (ref 4.0–10.5)
nRBC: 0.2 % (ref 0.0–0.2)

## 2021-12-23 LAB — IRON AND TIBC
Iron: 87 ug/dL (ref 45–182)
Saturation Ratios: 40 % — ABNORMAL HIGH (ref 17.9–39.5)
TIBC: 218 ug/dL — ABNORMAL LOW (ref 250–450)
UIBC: 131 ug/dL

## 2021-12-23 LAB — PREPARE RBC (CROSSMATCH)

## 2021-12-23 LAB — OCCULT BLOOD X 1 CARD TO LAB, STOOL: Fecal Occult Bld: POSITIVE — AB

## 2021-12-23 NOTE — Progress Notes (Signed)
Occupational Therapy Treatment ?Patient Details ?Name: Kevin Gross ?MRN: [id number] ?DOB: April 28, 1956 ?Today's Date: 12/23/2021 ? ? ?History of present illness 66 y.o. M who presents 12/12/2021 with infected post surgical lymphcele bilateral groinds. Underwent wound washout on 4/18 and I&D on 4/20. Significant PMH: alcohol related cirrhosis, COPD, heart failure, obesity, HTN, bifemoral endarterectomy on 3/31. ?  ?OT comments ? Pt progressing well towards established OT goals. Pt performing functional mobility with Min guard A and RW. Providing education on LB dressing techniques with use of AE as well as shower transfers. Pt verbalizing understanding. Will continue to follow acutely and facilitate further practice with AE. Continue to recommend dc to home with HHOT.  ? ?Recommendations for follow up therapy are one component of a multi-disciplinary discharge planning process, led by the attending physician.  Recommendations may be updated based on patient status, additional functional criteria and insurance authorization. ?   ?Follow Up Recommendations ? Home health OT  ?  ?Assistance Recommended at Discharge Intermittent Supervision/Assistance  ?Patient can return home with the following ? A little help with walking and/or transfers;A lot of help with bathing/dressing/bathroom;Assistance with cooking/housework;Assist for transportation;Help with stairs or ramp for entrance ?  ?Equipment Recommendations ? None recommended by OT  ?  ?Recommendations for Other Services PT consult ? ?  ?Precautions / Restrictions Precautions ?Precautions: Fall;Other (comment) ?Restrictions ?Weight Bearing Restrictions: No  ? ? ?  ? ?Mobility Bed Mobility ?  ?  ?  ?  ?  ?  ?  ?General bed mobility comments: Sitting in recliner upon arrival ?  ? ?Transfers ?Overall transfer level: Needs assistance ?Equipment used: Rolling walker (2 wheels) ?Transfers: Sit to/from Stand ?Sit to Stand: Min guard ?  ?  ?  ?  ?  ?General transfer comment: Min  Guard A for safety ?  ?  ?Balance Overall balance assessment: Needs assistance ?Sitting-balance support: No upper extremity supported, Feet supported ?Sitting balance-Leahy Scale: Good ?  ?  ?Standing balance support: Bilateral upper extremity supported, During functional activity, Reliant on assistive device for balance, No upper extremity supported ?Standing balance-Leahy Scale: Good ?  ?  ?  ?  ?  ?  ?  ?  ?  ?  ?  ?  ?   ? ?ADL either performed or assessed with clinical judgement  ? ?ADL Overall ADL's : Needs assistance/impaired ?Eating/Feeding: Set up;Supervision/ safety;Sitting ?  ?Grooming: Supervision/safety;Set up;Sitting ?  ?  ?  ?  ?  ?  ?  ?  ?Lower Body Dressing Details (indicate cue type and reason): Discussing use of AE for LB dressing, which has done in the past. ?  ?  ?  ?  ?  ?  ?Functional mobility during ADLs: Min guard;Rolling walker (2 wheels) ?General ADL Comments: Pt performing functional mobility in hallway. Discussing shower transfer and LB dressing. ?  ? ?Extremity/Trunk Assessment Upper Extremity Assessment ?Upper Extremity Assessment: Overall WFL for tasks assessed ?  ?Lower Extremity Assessment ?Lower Extremity Assessment: Overall WFL for tasks assessed ?  ?  ?  ? ?Vision   ?  ?  ?Perception   ?  ?Praxis   ?  ? ?Cognition Arousal/Alertness: Awake/alert ?Behavior During Therapy: [facility] for tasks assessed/performed ?Overall Cognitive Status: Within Functional Limits for tasks assessed ?  ?  ?  ?  ?  ?  ?  ?  ?  ?  ?  ?  ?  ?  ?  ?  ?  ?  ?  ?   ?  Exercises   ? ?  ?Shoulder Instructions   ? ? ?  ?General Comments VSS  ? ? ?Pertinent Vitals/ Pain       Pain Assessment ?Pain Assessment: Faces ?Faces Pain Scale: Hurts a little bit ?Pain Location: groin ?Pain Descriptors / Indicators: Sore ?Pain Intervention(s): Monitored during session, Limited activity within patient's tolerance, Repositioned ? ?Home Living   ?  ?  ?  ?  ?  ?  ?  ?  ?  ?  ?  ?  ?  ?  ?  ?  ?  ?  ? ?  ?Prior  Functioning/Environment    ?  ?  ?  ?   ? ?Frequency ? Min 2X/week  ? ? ? ? ?  ?Progress Toward Goals ? ?OT Goals(current goals can now be found in the care plan section) ? Progress towards OT goals: Progressing toward goals ? ?Acute Rehab OT Goals ?OT Goal Formulation: With patient ?Time For Goal Achievement: 01/03/22 ?Potential to Achieve Goals: Good ?ADL Goals ?Pt Will Perform Grooming: with supervision;standing ?Pt Will Perform Lower Body Dressing: with supervision;sit to/from stand;with adaptive equipment ?Pt Will Transfer to Toilet: with supervision;ambulating;regular height toilet ?Pt Will Perform Toileting - Clothing Manipulation and hygiene: with supervision;sitting/lateral leans;sit to/from stand  ?Plan Discharge plan remains appropriate   ? ?Co-evaluation ? ? ?   ?  ?  ?  ?  ? ?  ?AM-PAC OT "6 Clicks" Daily Activity     ?Outcome Measure ? ? Help from another person eating meals?: None ?Help from another person taking care of personal grooming?: A Little ?Help from another person toileting, which includes using toliet, bedpan, or urinal?: A Little ?Help from another person bathing (including washing, rinsing, drying)?: A Lot ?Help from another person to put on and taking off regular upper body clothing?: A Little ?Help from another person to put on and taking off regular lower body clothing?: A Lot ?6 Click Score: 17 ? ?  ?End of Session   ? ?OT Visit Diagnosis: Unsteadiness on feet (R26.81);Other abnormalities of gait and mobility (R26.89);Muscle weakness (generalized) (M62.81);Pain ?Pain - Right/Left: Right ?Pain - part of body: Leg ?  ?Activity Tolerance Treatment limited secondary to medical complications (Comment) ?  ?Patient Left in bed;with call bell/phone within reach ?  ?Nurse Communication   ?  ? ?   ? ?Time: 4287-6811 ?OT Time Calculation (min): 19 min ? ?Charges: OT General Charges ?$OT Visit: 1 Visit ?OT Treatments ?$Self Care/Home Management : 8-22 mins ? ?Kevin Gross MSOT, OTR/L ?Acute  Rehab ?Pager: [phone] ?Office: [phone] ? ?Kevin Gross ?12/23/2021, 4:42 PM ? ? ?

## 2021-12-23 NOTE — Progress Notes (Signed)
?Progress Note ? ? ?Patient: Kevin Gross INO:[id number] DOB: 1956-03-11 DOA: 12/12/2021     11 ?DOS: the patient was seen and examined on 12/23/2021 ?  ?Brief hospital course: ?Medical consultation.  ? ?Mr. Rubi, 66 yo male with the past medical history of alcohol related cirrhosis, COPD, heart failure, dyslipidemia, obesity and hypertension developed wound infection following bifemoral endarterectomy on 03/31. He reported drainage from groin wounds more left than right, along with local bleeding and edema.  ?Admitted to the hospital on 12/12/21 with the working diagnosis of infected post surgical lymphocele bilateral groins.  ?Patient was placed on antibiotic therapy. ?Underwent wound washout on 04/18 and I&D on 04/20   ? ?Wound culture positive for MSSA, Klebseila and enterococcus fecalis.   ? ?04/21 PRBC transfused x2 for worsening anemia.  ? ?04/24 patient continue hemodynamically stable.  ?04/25 plan for further debridement in OR tomorrow  ? ?Assessment and Plan: ?* Wound infection after surgery ?Infected lymphocele of bilateral groins. ?04/18 bilateral groin wound washout debridement, antibiotic bead placement and VAC placement. ?04/20 I&D of bilateral groin wounds including skin and subcutaneous tissue fat.  ?Placement of bilateral wound vacs.  ? ?Culture positive for klebsiella, enterococcus and staphylococcus (MSSA).  ?Patient continue to be afebrile ?Antibiotic as per vascular, wound VAC as per vascular ?Out of bed to chair tid with meals.  ?It appears he may be stabilizing from vascular perspective for discharge? ? ?Acute kidney injury superimposed on chronic kidney disease ([place]) ?Patient with no signs of hypervolemia.  ?Stable renal function  ? ?Pre-diabetes ?A1c was 5.6  ?Continue glucose cover and monitoring with insulin sliding scale.  ?Fasting glucose this am 129 mg/dl  ?Dyslipidemia, continue with statin therapy.  ? ?Essential hypertension ?Patient off antihypertensive agents--only on [place]-add  meds as needed ? ?COPD (chronic obstructive pulmonary disease) ([place]) ?No acute exacerbation.  ? ?PAD (peripheral artery disease) ([place]) ?Continue with aspirin and clopidogrel ?Patient sp revascularization  ? ?PAF (paroxysmal atrial fibrillation) ([place]) ?Reviewed cardiology evaluation from 12/04/21. ?Patient with postoperative atrial fibrillation, complicated with acute blood loss anemia and requiring PRBC transfusion.  ?Recommendations to continue rate control with amiodarone and no anticoagulation for now.  ?Continue with aspirin and clopidogrel for ilac stents, ?Plan for possible post hospitalization 30 day monitor to consider anticoagulation if significant paroxysmal atrial fibrillation.  ? ?Alcoholic cirrhosis of liver without ascites ([place]) ?No clinical signs of withdrawal. ?Continue close neuro checks, out of bed to chair tid with meals.  ?Follow up with nutrition recommendations.  ? ?OSA (obstructive sleep apnea) ?-Continue CPAP ? ?Anemia Hemoccult +4/27 Post operative anemia  ?His Hemoccult was positive but the stool reported by nursing was brown and soft and no overt blood-he has had a colonoscopy several years ago with removal of multiple polyps ?SP 2 units PRBC transfusion with good toleration. ?Received another 2 units 4/27 ?His acute component of anemia secondary to back-to-back surgeries with serosanguineous discharge in the [facility] ?We will not pursue inpatient colonoscopy unless there is acute melena or hematemesis ? ?Class 3 obesity (HCC) ?-Body mass index is 44.85 kg/m?. ? ? ? ? ? ?  ? ?Subjective: Pain is 5 on 10 otherwise he is fine ? ?Physical Exam: ?Vitals:  ? 12/23/21 1433 12/23/21 1540 12/23/21 1610 12/23/21 1827  ?BP: 131/79 126/71 (!) 162/81 (!) 141/69  ?Pulse: 88 83 72 65  ?Resp: '14 14 14 17  '$ ?Temp: 97.7 ?F (36.5 ?C) (!) 97.5 ?F (36.4 ?C) (!) 97.5 ?F (36.4 ?C) (!) 97.5 ?F (36.4 ?C)  ?  TempSrc: Oral Oral Oral Oral  ?SpO2: 98% 98% 98% 99%  ?Weight:      ?Height:      ? ?Coherent pleasant no distress  pain 5/10 ?Chest clear no added sound ?Abdomen soft protuberant mild swelling/postoperative swelling in both groin areas ?No lower extremity edema ?Chest is S1-S2 no murmur no rub no gallop ? ? ?Family Communication: no family at the bedside  ? ?Disposition: ?Status is: Inpatient ?Remains inpatient appropriate because: IV antibiotics and complex wound care.  ? Planned Discharge Destination:  to be determined  ? ? ? ? ? ?Author: ?Nita Sells, MD ?12/23/2021 6:44 PM ? ?For on call review [email or web address]  ?

## 2021-12-23 NOTE — Care Management Important Message (Signed)
Important Message ? ?Patient Details  ?Name: Kevin Gross ?MRN: [id number] ?Date of Birth: 12-Apr-1956 ? ? ?Medicare Important Message Given:  Yes ? ? ? ? ?[name] ?12/23/2021, 10:32 AM ?

## 2021-12-23 NOTE — Progress Notes (Addendum)
Physical Therapy Treatment ?Patient Details ?Name: Kevin Gross ?MRN: [id number] ?DOB: 20-Jan-1956 ?Today's Date: 12/23/2021 ? ? ?History of Present Illness 66 y.o. M who presents 12/12/2021 with infected post surgical lymphcele bilateral groinds. Underwent wound washout on 4/18 and I&D on 4/20. Significant PMH: alcohol related cirrhosis, COPD, heart failure, obesity, HTN, bifemoral endarterectomy on 3/31. ? ?  ?PT Comments  ? ? Pt received in supine, agreeable to physical therapy session with emphasis on gait and stairs training. Pt making excellent progress toward PT goals exhibiting increased activity tolerance and ambulation distance. Pt needing up to min guard for bed mobility, transfers from EOB/chair heights, gait training and stair negotiation. Pt noted to have low Hgb (6.9) however denies symptoms of shortness of breath or lightheadedness during functional mobility tasks and VSS on RA, HR to 122 bpm with exertion. Plan for next session is to increase gait distance and stairs training with emphasis on safety to increase pts mobility and functional independence. Pt continues to benefit from PT services to progress toward functional mobility goals.  ?Recommendations for follow up therapy are one component of a multi-disciplinary discharge planning process, led by the attending physician.  Recommendations may be updated based on patient status, additional functional criteria and insurance authorization. ? ?Follow Up Recommendations ? Home health PT ?  ?  ?Assistance Recommended at Discharge Intermittent Supervision/Assistance  ?Patient can return home with the following A little help with walking and/or transfers;A little help with bathing/dressing/bathroom;Assistance with cooking/housework;Assist for transportation;Help with stairs or ramp for entrance ?  ?Equipment Recommendations ? None recommended by PT  ?  ?   ?Precautions / Restrictions Precautions ?Precautions: Fall;Other (comment) ?Restrictions ?Weight  Bearing Restrictions: No ? ?  ? ?Mobility ? Bed Mobility ?Overal bed mobility: Needs Assistance ?Bed Mobility: Rolling ?Rolling: Min guard ?  ?Supine to sit: Min guard (LOB x 1 but pt was able to self correct) ?  ?  ?General bed mobility comments: used rail to assist; log roll technique ?  ? ?Transfers ?Overall transfer level: Needs assistance ?Equipment used: Rolling walker (2 wheels) ?Transfers: Sit to/from Stand ?Sit to Stand: Min guard ?  ?  ?General transfer comment: min guard to power up to stand from EOB to RW ?  ? ?Ambulation/Gait ?Ambulation/Gait assistance: Min guard ?Gait Distance (Feet): 70 Feet ?Assistive device: Rolling walker (2 wheels) Bariatric size ?Gait Pattern/deviations: Step-through pattern, Decreased stride length, Wide base of support ?Gait velocity: decreased ?  ?  ?General Gait Details: Utilizing wider BOS, min guard for safety ? ? ?Stairs ?Stairs: Yes ?Stairs assistance: Min guard ?Stair Management: Two rails, Step to pattern, Forwards, Backwards ?Number of Stairs: 7 ?General stair comments: Ascends and descends one 7" step in room x7 reps with bil rails per home setup with extra time and min guard for safety. Educated pt to ascend with strong leg and descend with weak/bad leg if he has one. ? ? ? ? ? ?  ?Balance Overall balance assessment: Needs assistance ?Sitting-balance support: No upper extremity supported, Feet supported ?Sitting balance-Leahy Scale: Good ?  ?  ?Standing balance support: Bilateral upper extremity supported, During functional activity, Reliant on assistive device for balance ?Standing balance-Leahy Scale: Good ?Standing balance comment: Reliant on RW ?  ?  ? ?  ?Cognition Arousal/Alertness: Awake/alert ?Behavior During Therapy: [facility] for tasks assessed/performed ?Overall Cognitive Status: Within Functional Limits for tasks assessed ?  ?  ?General Comments: very cooperative, good at following 1 and 2-step instructions. ?  ? ?  ?Exercises General  Exercises - Lower  Extremity ?Ankle Circles/Pumps: AROM, Both, 5 reps, Supine ?Short Arc Quad: AROM, Both, 10 reps, Supine ?Heel Slides: AROM, Both, 10 reps, Supine ?Hip ABduction/ADduction: AROM, Both, 10 reps, Supine ?Straight Leg Raises: AROM, Both, 5 reps, Supine ?Hip Flexion/Marching: AROM, Both, 10 reps, Standing ? ?  ?   ? ?Pertinent Vitals/Pain Pain Assessment ?Pain Assessment: 0-10 ?Pain Score: 7  (pt reported 6/10 groin pain before PT session and 7/10 at the end of the session.) ?Pain Location: groin ?Pain Descriptors / Indicators: Sore ?Pain Intervention(s): Monitored during session  ? ? ?   ?   ? ?PT Goals (current goals can now be found in the care plan section) Acute Rehab PT Goals ?Patient Stated Goal: to improve ?PT Goal Formulation: With patient ?Time For Goal Achievement: 01/02/22 ?Potential to Achieve Goals: Good ?Progress towards PT goals: Progressing toward goals ? ?  ?Frequency ? ? ? Min 3X/week ? ? ? ?  ?PT Plan Current plan remains appropriate  ? ? ?   ?AM-PAC PT "6 Clicks" Mobility   ?Outcome Measure ? Help needed turning from your back to your side while in a flat bed without using bedrails?: A Little ?Help needed moving from lying on your back to sitting on the side of a flat bed without using bedrails?: A Little ?Help needed moving to and from a bed to a chair (including a wheelchair)?: A Little ?Help needed standing up from a chair using your arms (e.g., wheelchair or bedside chair)?: A Little ?Help needed to walk in hospital room?: A Little ?Help needed climbing 3-5 steps with a railing? : A Little ?6 Click Score: 18 ? ?  ?End of Session Equipment Utilized During Treatment: Gait belt ?Activity Tolerance: Patient tolerated treatment well ?Patient left: in chair;with call bell/phone within reach;with chair alarm set;with nursing/sitter in room ?Nurse Communication: Mobility status ?PT Visit Diagnosis: Unsteadiness on feet (R26.81);Other abnormalities of gait and mobility (R26.89) ?  ? ? ?Time: 1040-1108 ?PT  Time Calculation (min) (ACUTE ONLY): 28 min ? ?Charges:  $Gait Training: 8-22 mins ?$Therapeutic Exercise: 8-22 mins          ?          ? ?Wadie Lessen, SPTA ? ? ? ?Tiffany Mutch ?12/23/2021, 12:18 PM ? ?

## 2021-12-23 NOTE — Progress Notes (Addendum)
?  7:53 AM ?Progress Note ? ? ? ?12/23/2021 ?7:53 AM ?1 Day Post-Op ? ?Subjective:  no complaints. Says he is tired but otherwise denies any chest pain, sob, dizziness ? ? ?Vitals:  ? 12/22/21 2352 12/23/21 0356  ?BP: 128/67 119/73  ?Pulse: 72 68  ?Resp: 14 15  ?Temp: 98 ?F (36.7 ?C) 98.1 ?F (36.7 ?C)  ?SpO2: 97% 98%  ? ?Physical Exam: ?Cardiac:  regular ?Lungs:  non labored ?Incisions:  B groins with VAC to suction. Sanguinous output ?Extremities:  well perfused and warm ?Abdomen:  obese, soft ?Neurologic: Alert and oriented ? ?CBC ?   ?Component Value Date/Time  ? WBC 8.3 12/23/2021 0235  ? RBC 2.17 (L) 12/23/2021 0235  ? HGB 6.9 (LL) 12/23/2021 0235  ? HGB 13.3 10/12/2021 1314  ? HCT 21.5 (L) 12/23/2021 0235  ? HCT 39.4 10/12/2021 1314  ? PLT 222 12/23/2021 0235  ? PLT 175 10/12/2021 1314  ? MCV 99.1 12/23/2021 0235  ? MCV 97 10/12/2021 1314  ? MCH 31.8 12/23/2021 0235  ? MCHC 32.1 12/23/2021 0235  ? RDW 19.0 (H) 12/23/2021 0235  ? RDW 13.1 10/12/2021 1314  ? LYMPHSABS 0.9 12/12/2021 1702  ? LYMPHSABS 1.9 10/12/2021 1314  ? MONOABS 0.7 12/12/2021 1702  ? EOSABS 0.4 12/12/2021 1702  ? EOSABS 0.7 (H) 10/12/2021 1314  ? BASOSABS 0.0 12/12/2021 1702  ? BASOSABS 0.1 10/12/2021 1314  ? ? ?BMET ?   ?Component Value Date/Time  ? NA 137 12/23/2021 0235  ? NA 145 (H) 10/12/2021 1314  ? K 4.8 12/23/2021 0235  ? CL 106 12/23/2021 0235  ? CO2 25 12/23/2021 0235  ? GLUCOSE 143 (H) 12/23/2021 0235  ? BUN 40 (H) 12/23/2021 0235  ? BUN 37 (H) 10/12/2021 1314  ? CREATININE 2.16 (H) 12/23/2021 0235  ? CALCIUM 8.8 (L) 12/23/2021 0235  ? GFRNONAA 33 (L) 12/23/2021 0235  ? GFRAA 56 (L) 10/26/2019 0551  ? ? ?INR ?   ?Component Value Date/Time  ? INR 1.2 12/13/2021 0002  ? ? ? ?Intake/Output Summary (Last 24 hours) at 12/23/2021 0753 ?Last data filed at 12/23/2021 0351 ?Gross per 24 hour  ?Intake 1300.09 ml  ?Output 1225 ml  ?Net 75.09 ml  ? ? ? ?Assessment/Plan:  67 y.o. 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 of xenograft, bilateral groin ?#3: Placement of bilateral wound VAC 1 Day Post-Op  ? ?Wound VACs with good suction. 300 cc of sanguinous drainage ?Hgb 6.9 this morning. 2 units PRBC ordered ?Pain well controlled ?Mobilize as tolerated. OOB to chair ?HH orders placed for [facility] PT/OT/ RN ?Anticipate d/c tomorrow  ?Will arrange office follow up on Monday 5/1 for wound VAC change in our office ? ?Karoline Caldwell, PA-C ?[facility] ?[phone] ?12/23/2021 ?7:53 AM ? ?I agree with the above.  I have seen and examined the patient ? ?Kevin Gross ?

## 2021-12-23 NOTE — TOC Initial Note (Addendum)
Transition of Care (TOC) - Initial/Assessment Note  ?Marvetta Gibbons Therapist, sports, BSN ?Transitions of Care ?Unit 4E- RN Case Manager ?See Treatment Team for direct phone #  ? ? ?Patient Details  ?Name: Kevin Gross ?MRN: [id number] ?Date of Birth: Jan 03, 1956 ? ?Transition of Care (TOC) CM/SW Contact:    ?Dahlia Client, Romeo Rabon, RN ?Phone Number: ?12/23/2021, 11:52 AM ? ?Clinical Narrative:                 ?Pt from home w/ wife, admitted with post op wound infection. S/P I&D with wound VAC application to bil. Groin.  ?Pt was active with [facility]mings for [facility] services-RN/PT/OT - orders have been placed to resume services.  ?Pt had repeat I&D yesterday, noted in notes that plan will be for pt to return home with wound VAC. Home wound VAC form placed on chart for signature. - once signed will fax to [place] at [facility] to start insurance approval.  ?Per C. Baglia PA w/ vascular- pt will not need HHRN right away to change VACs. Thinks the plan will be initially to have them changed in vascular office because they have blue foam and skin graft Matrix but eventually vascular hopes to transition to [facility] RN in the next several weeks.  ?1000- [facility] home [facility] order faxed to [place] ? ?1430- received call from Olivia Mackie w/ [employer]- home wound VAC has been approved- Olivia Mackie to email POD- CM will pick up wound VAC in the am and deliver to patient at bedside.  ?CM spoke with pt and updated regarding home VAC approval and HH.  ? ?CM has spoken with Tommi Rumps at [place] to update on plan for home vacs to be changed at the Vascular office initially.  ? ? ?Expected Discharge Plan: [place] ?Barriers to Discharge: Continued Medical Work up ? ? ?Patient Goals and CMS Choice ?Patient states their goals for this hospitalization and ongoing recovery are:: return home ?CMS Medicare.gov Compare Post Acute Care list provided to:: Patient ?Choice offered to / list presented to : Patient ? ?Expected Discharge Plan and Services ?Expected Discharge Plan: [place] ?  ?Discharge Planning Services: CM Consult ?Post Acute Care Choice: Home Health, Resumption of Svcs/PTA Provider ?Living arrangements for the past 2 months: [place] ?                ?DME Arranged: Vac ?DME Agency: [facility] ?Date DME Agency Contacted: 12/23/21 ?Time DME Agency Contacted: 1000 ?Representative spoke with at DME Agency: Olivia Mackie ?HH Arranged: RN, PT, OT ?[place] Agency: [facility] ?Date HH Agency Contacted: 12/23/21 ?Time [place]: [phone] ?Representative spoke with at [place]: Tommi Rumps ? ?Prior Living Arrangements/Services ?Living arrangements for the past 2 months: [place] ?Lives with:: Spouse ?Patient language and need for interpreter reviewed:: Yes ?Do you feel safe going back to the place where you live?: Yes      ?Need for Family Participation in Patient Care: Yes (Comment) ?Care giver support system in place?: Yes (comment) ?Current home services: DME (RW, cpap) ?Criminal Activity/Legal Involvement Pertinent to Current Situation/Hospitalization: No - Comment as needed ? ?Activities of Daily Living ?Home Assistive Devices/Equipment: Cane (specify quad or straight), CPAP, Blood pressure cuff, Walker (specify type) ?ADL Screening (condition at time of admission) ?Patient's cognitive ability adequate to safely complete daily activities?: Yes ?Is the patient deaf or have difficulty hearing?: No ?Does the patient have difficulty seeing, even when wearing glasses/contacts?: No ?Does the patient have difficulty concentrating,  remembering, or making decisions?: No ?Patient able to express need for assistance with ADLs?: Yes ?Does the patient have difficulty dressing or bathing?: No ?Independently performs ADLs?: Yes (appropriate for developmental age) ?Does the patient have difficulty walking or climbing stairs?: No ?Weakness of Legs: Both ?Weakness of Arms/Hands: Both ? ?Permission Sought/Granted ?Permission sought to share information with : Forensic psychologist ?Permission granted to share information with : Yes, Release of Information Signed ?   ? Permission granted to share info w AGENCY: HH/DME ?   ?   ? ?Emotional Assessment ?Appearance:: Appears stated age ?Attitude/Demeanor/Rapport: Engaged ?Affect (typically observed): Accepting, Appropriate, Pleasant ?Orientation: : Oriented to Self, Oriented to Place, Oriented to  Time, Oriented to Situation ?Alcohol / Substance Use: Not Applicable ?Psych Involvement: No (comment) ? 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 Diagnosis Date Noted  ? Anemia 12/19/2021  ? Alcoholic cirrhosis of liver without ascites ([place]) 12/13/2021  ? Pre-diabetes 12/13/2021  ? OSA (obstructive sleep apnea) 12/13/2021  ? Essential hypertension 12/13/2021  ? Wound infection after surgery 12/12/2021  ? PAF (paroxysmal atrial fibrillation) ([place])   ? 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 PAD (peripheral artery disease) ([place]) 11/26/2021  ? Cellulitis of left lower extremity 02/18/2019  ? Aortic atherosclerosis ([place]) 04/10/2018  ? Class 3 obesity ([place]) 04/10/2018  ? CAD (coronary artery disease), native coronary artery 04/08/2018  ? COPD (chronic obstructive pulmonary disease) ([place]) 04/08/2018  ? Acute kidney injury superimposed on chronic kidney disease ([place]) 04/08/2018  ? ?PCP:  Rutherford Limerick, PA ?Pharmacy:   ?[facility] [address] ?Phone: [phone] Fax: [phone] ? ?[facility] ?[address] ?[place] [address] ?Phone: [phone] Fax: [phone] ? ? ? ? ?Social Determinants of Health (SDOH) Interventions ?  ? ?Readmission Risk Interventions ? ?   12/06/2021  ? 10:01 AM  ?Readmission Risk Prevention Plan  ?Transportation Screening Complete  ?PCP or Specialist Appt within 5-7 Days Complete  ?Home Care Screening Complete  ?Medication Review (RN CM) Complete  ? ? ? ?

## 2021-12-23 NOTE — Progress Notes (Signed)
Pharmacy Antibiotic Note ? ?Kevin Gross is a 66 y.o. male admitted on 12/12/2021 with L groin wound with persistent lymphatic drainage.  He is s/p bilateral groin debridement on 4/18 and 4/20.  R groin culture now growing rare staph aureus that is pan-sensitive. Pharmacy has been asked to narrow vancomycin and Zosyn to cefazolin.   ? ?Cr remains stable with CrCl ~45 ml/min. Pt s/p debridement 4/26 in OR. ? ?Plan: ?Cefazolin 2g IV q8h ?F/U renal function and clinical progress ?F/U LOT ? ? ?Temp (24hrs), Avg:98.2 ?F (36.8 ?C), Min:97.5 ?F (36.4 ?C), Max:98.5 ?F (36.9 ?C) ? ?Recent Labs  ?Lab 12/18/21 ?0133 12/19/21 ?0451 12/20/21 ?0255 12/21/21 ?1007 12/23/21 ?0235  ?WBC 7.3 7.3 8.9 8.4 8.3  ?CREATININE 2.95* 2.28* 2.35* 2.08* 2.16*  ? ?  ?Estimated Creatinine Clearance: 46.1 mL/min (A) (by C-G formula based on SCr of 2.16 mg/dL (H)).   ? ?No Known Allergies ? ?Antimicrobials this admission: ?Vanc 4/16 >> 4/21 ?Zosyn 4/16 >> 4/21 ?Cefazolin 4/21 > ? ?Dose adjustments this admission: ?N/A ? ?Microbiology results: ?4/17 L groin wound - few kleb oxy, mod e. Faecalis, few MSSA (final) ?4/17 R groin (OR) - no orgs; rare staph aureus (panS) ?  ? ?Thank you for involving pharmacy in this patient's care. ? ? ?Arrie Senate, PharmD, BCPS, BCCP ?Clinical Pharmacist ?[phone] ?Please check AMION for all [place] numbers ?12/23/2021 ? ?

## 2021-12-24 ENCOUNTER — Telehealth: Payer: Self-pay | Admitting: Medical

## 2021-12-24 ENCOUNTER — Other Ambulatory Visit: Payer: Self-pay | Admitting: Cardiology

## 2021-12-24 ENCOUNTER — Other Ambulatory Visit (HOSPITAL_COMMUNITY): Payer: Self-pay

## 2021-12-24 DIAGNOSIS — I479 Paroxysmal tachycardia, unspecified: Secondary | ICD-10-CM

## 2021-12-24 LAB — TYPE AND SCREEN
ABO/RH(D): A POS
Antibody Screen: NEGATIVE
Unit division: 0
Unit division: 0

## 2021-12-24 LAB — CBC
HCT: 26.4 % — ABNORMAL LOW (ref 39.0–52.0)
Hemoglobin: 8.3 g/dL — ABNORMAL LOW (ref 13.0–17.0)
MCH: 30.7 pg (ref 26.0–34.0)
MCHC: 31.4 g/dL (ref 30.0–36.0)
MCV: 97.8 fL (ref 80.0–100.0)
Platelets: 224 10*3/uL (ref 150–400)
RBC: 2.7 MIL/uL — ABNORMAL LOW (ref 4.22–5.81)
RDW: 19.4 % — ABNORMAL HIGH (ref 11.5–15.5)
WBC: 9.4 10*3/uL (ref 4.0–10.5)
nRBC: 0.4 % — ABNORMAL HIGH (ref 0.0–0.2)

## 2021-12-24 LAB — BPAM RBC
Blood Product Expiration Date: 202305162359
Blood Product Expiration Date: 202305162359
ISSUE DATE / TIME: 202304271141
ISSUE DATE / TIME: 202304271550
Unit Type and Rh: 6200
Unit Type and Rh: 6200

## 2021-12-24 LAB — GLUCOSE, CAPILLARY
Glucose-Capillary: 110 mg/dL — ABNORMAL HIGH (ref 70–99)
Glucose-Capillary: 118 mg/dL — ABNORMAL HIGH (ref 70–99)

## 2021-12-24 LAB — BASIC METABOLIC PANEL
Anion gap: 8 (ref 5–15)
BUN: 46 mg/dL — ABNORMAL HIGH (ref 8–23)
CO2: 25 mmol/L (ref 22–32)
Calcium: 8.9 mg/dL (ref 8.9–10.3)
Chloride: 106 mmol/L (ref 98–111)
Creatinine, Ser: 1.74 mg/dL — ABNORMAL HIGH (ref 0.61–1.24)
GFR, Estimated: 43 mL/min — ABNORMAL LOW (ref >60)
Glucose, Bld: 114 mg/dL — ABNORMAL HIGH (ref 70–99)
Potassium: 4.4 mmol/L (ref 3.5–5.1)
Sodium: 139 mmol/L (ref 135–145)

## 2021-12-24 MED ORDER — OXYCODONE-ACETAMINOPHEN 5-325 MG PO TABS
1.0000 | ORAL_TABLET | Freq: Four times a day (QID) | ORAL | 0 refills | Status: DC | PRN
  Filled 2021-12-24: qty 30, 8d supply, fill #0

## 2021-12-24 NOTE — Progress Notes (Addendum)
Physical Therapy Treatment ?Patient Details ?Name: Kevin Gross ?MRN: [id number] ?DOB: [birth year] ?Today's Date: 12/24/2021 ? ? ?History of Present Illness 66 y.o. M who presents 12/12/2021 with infected post surgical lymphcele bilateral groinds. Underwent wound washout on 4/18 and I&D on 4/20. Significant PMH: alcohol related cirrhosis, COPD, heart failure, obesity, HTN, bifemoral endarterectomy on 3/31. ? ?  ?PT Comments  ? ? Pt received in supine, pleasantly agreeable to therapy session with emphasis on transfer safety and mobility progression with longer gait trial and stair ascent in stairwell for BLE strengthening. Pt needing Supervision at most for transfers/gait and stair ascent with min cues for safer technique and activity pacing. Emphasis on gradual progression of mobility within tolerance for building strength/endurance. Pt in bathroom needing ~10 mins at end of session so RN/NT notified, pt to use call bell for assist/safety back to bed with RW and wound vac. Pt continues to benefit from PT services to progress toward functional mobility goals. DME updated per discussion with pt and supervising PT Anessa P.  ?Recommendations for follow up therapy are one component of a multi-disciplinary discharge planning process, led by the attending physician.  Recommendations may be updated based on patient status, additional functional criteria and insurance authorization. ? ?Follow Up Recommendations ? Home health PT ?  ?  ?Assistance Recommended at Discharge Intermittent Supervision/Assistance  ?Patient can return home with the following A little help with walking and/or transfers;A little help with bathing/dressing/bathroom;Assistance with cooking/housework;Assist for transportation;Help with stairs or ramp for entrance ?  ?Equipment Recommendations ? Rolling walker (2 wheels) (bariatric sized)  ?  ?Recommendations for Other Services   ? ? ?  ?Precautions / Restrictions Precautions ?Precautions: Fall;Other  (comment) ?Precaution Comments: wound vac ?Restrictions ?Weight Bearing Restrictions: No  ?  ? ?Mobility ? Bed Mobility ?Overal bed mobility: Modified Independent ?Bed Mobility: Rolling, Sidelying to Sit ?Rolling: Modified independent (Device/Increase time) ?Sidelying to sit: Modified independent (Device/Increase time) ?  ?  ?  ?General bed mobility comments: pt initially attempting to use bed rail and HOB elevated however when HOB lowered and rail removed, pt still able to sit EOB via log roll when cued to attempt without hospital bed features w/o assist ?  ? ?Transfers ?Overall transfer level: Needs assistance ?Equipment used: Rolling walker (2 wheels) ?Transfers: Sit to/from Stand ?Sit to Stand: Supervision ?  ?  ?  ?  ?  ?General transfer comment: from EOB and to toilet heights, supervision for clothing mgmt and due to wound vac line, etc ?  ? ?Ambulation/Gait ?Ambulation/Gait assistance: Supervision ?Gait Distance (Feet): 250 Feet ?Assistive device: Rolling walker (2 wheels) ?Gait Pattern/deviations: Step-through pattern, Decreased stride length, Wide base of support, Trunk flexed, Decreased dorsiflexion - right, Decreased dorsiflexion - left ?Gait velocity: decreased ?  ?  ?General Gait Details: min cues for improved heel strike with good carryover and upright postural cues needed intermittently. HR to 110 bpm with exertion. ? ? ?Stairs ?Stairs: Yes ?Stairs assistance: Supervision ?Stair Management: One rail Left, Sideways, Step to pattern ?Number of Stairs: 3 ?General stair comments: facing L rail, sidestep up leading with good R leg, no LOB, HR stable, SpO2 stable on RA ? ? ?  ?Balance Overall balance assessment: Needs assistance ?Sitting-balance support: No upper extremity supported, Feet supported ?Sitting balance-Leahy Scale: Good ?  ?  ?Standing balance support: Bilateral upper extremity supported, During functional activity ?Standing balance-Leahy Scale: Good ?Standing balance comment: able to stand  with 0-1 UE support but utilized RW without external assist no  LOB ?  ?  ?  ?  ?  ?  ?  ?  ?  ?  ?  ?  ? ?  ?Cognition Arousal/Alertness: Awake/alert ?Behavior During Therapy: [facility] for tasks assessed/performed ?Overall Cognitive Status: Within Functional Limits for tasks assessed ?  ?   ?General Comments: very cooperative, good at following 1 and 2-step instructions. ?  ?  ? ?  ?   ?General Comments General comments (skin integrity, edema, etc.): HR to 110 bpm VSS on RA ?  ?  ? ?Pertinent Vitals/Pain Pain Assessment ?Pain Assessment: Faces ?Faces Pain Scale: Hurts a little bit ?Pain Location: groin ?Pain Descriptors / Indicators: Sore ?Pain Intervention(s): Monitored during session, Repositioned  ? ? ? ?PT Goals (current goals can now be found in the care plan section) Acute Rehab PT Goals ?Patient Stated Goal: to improve ?PT Goal Formulation: With patient ?Time For Goal Achievement: 01/02/22 ?Progress towards PT goals: Progressing toward goals ? ?  ?Frequency ? ? ? Min 3X/week ? ? ? ?  ?PT Plan Equipment recommendations need to be updated  ? ? ?   ?AM-PAC PT "6 Clicks" Mobility   ?Outcome Measure ? Help needed turning from your back to your side while in a flat bed without using bedrails?: None ?Help needed moving from lying on your back to sitting on the side of a flat bed without using bedrails?: None ?Help needed moving to and from a bed to a chair (including a wheelchair)?: A Little ?Help needed standing up from a chair using your arms (e.g., wheelchair or bedside chair)?: A Little ?Help needed to walk in hospital room?: A Little ?Help needed climbing 3-5 steps with a railing? : A Little ?6 Click Score: 20 ? ?  ?End of Session Equipment Utilized During Treatment: Gait belt ?Activity Tolerance: Patient tolerated treatment well ?Patient left: with call bell/phone within reach;Other (comment) (on toilet in bathroom, RN/NT notified and pt aware of need to use call bell due to wound vac lines) ?Nurse Communication:  Mobility status ?PT Visit Diagnosis: Unsteadiness on feet (R26.81);Other abnormalities of gait and mobility (R26.89) ?  ? ? ?Time: 1032-1050 ?PT Time Calculation (min) (ACUTE ONLY): 18 min ? ?Charges:  $Therapeutic Exercise: 8-22 mins          ?          ? ?Crystelle Ferrufino P., PTA ?Acute Rehabilitation Services ?Secure Chat Preferred 9a-5:30pm ?Office: [phone]  ? ? ?Kara Pacer Stevey Stapleton ?12/24/2021, 11:09 AM ? ?

## 2021-12-24 NOTE — Plan of Care (Signed)
?  Problem: Education: ?Goal: Knowledge of General Education information will improve ?Description: Including pain rating scale, medication(s)/side effects and non-pharmacologic comfort measures ?12/24/2021 1350 by Lasandra Beech, RN ?Outcome: Progressing ?12/24/2021 1041 by Lasandra Beech, RN ?Outcome: Progressing ?  ?

## 2021-12-24 NOTE — Progress Notes (Signed)
Discussed the plan of care with [name] from vascular surgery-from my perspective this gentleman is stable for discharge-he will need a Zio patch placed and I have asked that he reach out to cardiology to ensure that this is done-at this time I do not have any further recommendations and cede care to the expert management of Dr. Alberteen Spindle contact me if there are further questions ? ? ?Verneita Griffes, MD ?[facility] ?9:33 AM ? ?

## 2021-12-24 NOTE — Plan of Care (Signed)
  Problem: Education: Goal: Knowledge of General Education information will improve Description Including pain rating scale, medication(s)/side effects and non-pharmacologic comfort measures Outcome: Progressing   

## 2021-12-24 NOTE — Progress Notes (Signed)
?  Progress Note ? ? ? ?12/24/2021 ?7:40 AM ?2 Days Post-Op ? ?Subjective:  ready to go home ? ? ?Vitals:  ? 12/24/21 0024 12/24/21 0423  ?BP:  131/67  ?Pulse: (!) 55 64  ?Resp: 14 13  ?Temp:    ?SpO2: 98% 98%  ? ?Physical Exam: ?Lungs:  non labored ?Incisions:  B groin incisions wound vac ?Extremities:  feet warm and well perfused ?Neurologic: A&O ? ?CBC ?   ?Component Value Date/Time  ? WBC 9.4 12/24/2021 0201  ? RBC 2.70 (L) 12/24/2021 0201  ? HGB 8.3 (L) 12/24/2021 0201  ? HGB 13.3 10/12/2021 1314  ? HCT 26.4 (L) 12/24/2021 0201  ? HCT 39.4 10/12/2021 1314  ? PLT 224 12/24/2021 0201  ? PLT 175 10/12/2021 1314  ? MCV 97.8 12/24/2021 0201  ? MCV 97 10/12/2021 1314  ? MCH 30.7 12/24/2021 0201  ? MCHC 31.4 12/24/2021 0201  ? RDW 19.4 (H) 12/24/2021 0201  ? RDW 13.1 10/12/2021 1314  ? LYMPHSABS 0.9 12/12/2021 1702  ? LYMPHSABS 1.9 10/12/2021 1314  ? MONOABS 0.7 12/12/2021 1702  ? EOSABS 0.4 12/12/2021 1702  ? EOSABS 0.7 (H) 10/12/2021 1314  ? BASOSABS 0.0 12/12/2021 1702  ? BASOSABS 0.1 10/12/2021 1314  ? ? ?BMET ?   ?Component Value Date/Time  ? NA 139 12/24/2021 0201  ? NA 145 (H) 10/12/2021 1314  ? K 4.4 12/24/2021 0201  ? CL 106 12/24/2021 0201  ? CO2 25 12/24/2021 0201  ? GLUCOSE 114 (H) 12/24/2021 0201  ? BUN 46 (H) 12/24/2021 0201  ? BUN 37 (H) 10/12/2021 1314  ? CREATININE 1.74 (H) 12/24/2021 0201  ? CALCIUM 8.9 12/24/2021 0201  ? GFRNONAA 43 (L) 12/24/2021 0201  ? GFRAA 56 (L) 10/26/2019 0551  ? ? ?INR ?   ?Component Value Date/Time  ? INR 1.2 12/13/2021 0002  ? ? ? ?Intake/Output Summary (Last 24 hours) at 12/24/2021 0740 ?Last data filed at 12/23/2021 1828 ?Gross per 24 hour  ?Intake 1208.68 ml  ?Output 900 ml  ?Net 308.68 ml  ? ? ? ?Assessment/Plan:  66 y.o. male is s/p B groin debridements  2 Days Post-Op  ? ?H&H stable after transfusion ?Plan is for discharge home today with [facility]  ?We will change wound vac in office on Monday 5/1 then will need HH the following week ? ? ? ?Dagoberto Ligas, PA-C ?[facility] ?[phone] ?12/24/2021 ?7:40 AM ? ? ? ?

## 2021-12-24 NOTE — Discharge Summary (Signed)
?Discharge Summary ? ?Patient ID: ?Kevin Gross ?[id number] ?66 y.o. ?14-Oct-1955 ? ?Admit date: 12/12/2021 ? ?Discharge date and time: 12/24/21  ? ?Admitting Physician: Serafina Mitchell, MD  ? ?Discharge Physician: same ? 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 ?Discharge Diagnoses: PAD ?Lypmhocele ?Atrial fibrillation ?Positive hemoccult ? ?Admission Condition: fair ? ?Discharged Condition: fair ? ?Indication for Admission: post op care ? ?Hospital Course: [name] is a 66 year old male who presented to the emergency department with bilateral postsurgical groin wound dehiscence.  He was admitted to the hospital for IV fluids as well as IV antibiotics and wound care.  Hospitalist medicine was also consulted for management of paroxysmal postoperative atrial fibrillation, hypertension, prediabetes, cirrhosis of the liver, hyperlipidemia as well as anemia.  He was brought to the operating room by Dr. Virl Cagey on 12/13/2021 and underwent bilateral groin washout and debridement with antibiotic bead placement and wound VAC placement.  He was brought back to the operating room by Dr. Trula Slade on 12/16/2021 for further debridement and placement of wound VACs.  Postoperatively he experienced trouble obtaining a seal from groin wounds.  He underwent wet-to-dry dressing changes for the following several days.  IV antibiotics were tailored to intraoperative findings.  He was eventually brought back to the operating room on 12/22/2021 for further washout and debridement with placement of fish xenograft and VAC replacement.  Transition of care team arranged home health RN to assist with wound VAC wound care at home.  His first dressing change will be on Monday, 12/27/2021 at the vascular and vein office.  Wound VAC will be replaced for an additional 1 week  at which time we will likely have home health RN change VAC on a 2-3 times a week basis.  It should also be noted that he will be discharged on aspirin and Plavix and not fully anticoagulated despite paroxysmal postoperative atrial fibrillation.  The hospitalist team recommended patient wear an event monitor which will be arranged prior to discharge.  It should also be noted that patient had a positive Hemoccult despite stable H&H.  We have asked the patient to notify his GI physician for recommendations and follow-up.  Discharge instructions were reviewed with the patient and he voices understanding.  He will also be prescribed 2 to 3 days of narcotic pain medication for continued postoperative pain control.  He was discharged home in stable condition ? ?Consults:  Hospitalist Medicine ? ?Treatments: surgery: listed above ? ?Discharge Exam: See progress note 12/24/21 ?Vitals:  ? 12/24/21 0423 12/24/21 1132  ?BP: 131/67 127/69  ?Pulse: 64 81  ?Resp: 13 15  ?Temp:  (!) 97.3 ?F (36.3 ?C)  ?SpO2: 98% 97%  ? ? ? ?Disposition: Discharge disposition: 01-Home or Self Care ? ? ? ? ? ? ?Patient Instructions:  ?Allergies as of 12/24/2021   ?No Known Allergies ?  ? ?  ?Medication List  ?  ? ?TAKE these medications   ? ?acetaminophen 325 MG tablet ?Commonly known as: TYLENOL ?Take 2 tablets (650 mg total) by mouth every 6 (six) hours as needed for mild pain (or Fever >/= 101). ?  ?albuterol 108 (90 Base) MCG/ACT inhaler ?Commonly known as: VENTOLIN HFA ?Inhale 1-2 puffs into the lungs every 6 (six) hours as needed for wheezing or shortness of breath. ?  ?allopurinol 300 MG tablet ?Commonly known as: ZYLOPRIM ?Take 300 mg by mouth every morning. ?  ?  amiodarone 200 MG tablet ?Commonly known as: PACERONE ?Take 1 tablet (200 mg total) by mouth 2 (two) times daily. ?  ?aspirin EC 81 MG tablet ?Take 81 mg by mouth every morning. ?  ?atenolol 25 MG tablet ?Commonly known as: TENORMIN ?Take 25 mg by mouth every morning. ?   ?chlorthalidone 25 MG tablet ?Commonly known as: HYGROTON ?Take 25 mg by mouth every morning. ?  ?clopidogrel 75 MG tablet ?Commonly known as: PLAVIX ?Take 1 tablet (75 mg total) by mouth daily. ?  ?ezetimibe 10 MG tablet ?Commonly known as: ZETIA ?Take 1 tablet (10 mg total) by mouth daily. ?  ?fluticasone 50 MCG/ACT nasal spray ?Commonly known as: FLONASE ?Place 2 sprays into both nostrils daily as needed for allergies or rhinitis. ?  ?lisinopril 10 MG tablet ?Commonly known as: ZESTRIL ?Take 10 mg by mouth every morning. ?  ?oxyCODONE-acetaminophen 5-325 MG tablet ?Commonly known as: PERCOCET/ROXICET ?Take 1 tablet by mouth every 6 (six) hours as needed for moderate pain. ?  ?predniSONE 10 MG tablet ?Commonly known as: DELTASONE ?Take 10-40 mg by mouth See admin instructions. Take as needed at the onset of a gout flare, take 40 mg on day 1, 30 mg on day 2, 20 mg on day 3, 10 mg on day 4, then stop ?  ?Silvadene 1 % cream ?Generic drug: silver sulfADIAZINE ?Apply topically 2 (two) times daily. ?  ?simvastatin 20 MG tablet ?Commonly known as: ZOCOR ?Take 20 mg by mouth every evening. ?  ?STOOL SOFTENER PO ?Take 1 capsule by mouth daily as needed (constipation). ?  ?tamsulosin 0.4 MG Caps capsule ?Commonly known as: FLOMAX ?Take 0.4 mg by mouth daily after supper. ?  ?triamcinolone cream 0.1 % ?Commonly known as: KENALOG ?Apply 1 application. topically daily as needed (irritation on foot). ?  ?Vitamin D-1000 Max St 25 MCG (1000 UT) tablet ?Generic drug: Cholecalciferol ?Take 2,000 Units by mouth daily. ?  ?zolpidem 10 MG tablet ?Commonly known as: AMBIEN ?Take 10 mg by mouth at bedtime. ?  ? ?  ? ?  ?  ? ? ?  ?Durable Medical Equipment  ?(From admission, onward)  ?  ? ? ?  ? ?  Start     Ordered  ? 12/24/21 1049  For home use only DME Walker rolling  Once       ?Comments: Bariatric  ?Question Answer Comment  ?Walker: With 5 Inch Wheels   ?Patient needs a walker to treat with the following condition Weakness  generalized   ?  ? 12/24/21 1049  ? ?  ?  ? ?  ? ?Activity: activity as tolerated ?Diet: regular diet ?Wound Care:  wound vac bilateral groins as directed ? ?Follow-up with [facility] on Monday 12/27/21 ? ?Signed: ?Dagoberto Ligas, PA-C ?12/24/2021 ?12:48 PM ?[facility] Office: [phone] ? ?

## 2021-12-24 NOTE — Progress Notes (Signed)
Internal medicine requested that cardiology order a 30 day cardiac monitor on this patient to evaluate for possible paroxysmal atrial fibrillation. Dr. Rockey Situ to read.  ?

## 2021-12-24 NOTE — TOC Transition Note (Addendum)
Transition of Care (TOC) - CM/SW Discharge Note ?Marvetta Gibbons Therapist, sports, BSN ?Transitions of Care ?Unit 4E- RN Case Manager ?See Treatment Team for direct phone #  ? ? ?Patient Details  ?Name: Kevin Gross ?MRN: [id number] ?Date of Birth: July 31, 1956 ? ?Transition of Care (TOC) CM/SW Contact:  ?Dahlia Client, Romeo Rabon, RN ?Phone Number: ?12/24/2021, 12:03 PM ? ? ?Clinical Narrative:    ?Pt stable for transition home today- [employer] home ready VAC picked up and has been delivered to room by CM- pt signed POD and this paperwork has been faxed back to [facility] at [place]. Copy of paperwork placed in shadow chart- originals given to patient. Bedside RN to change over to home [facility] prior to discharge.  ? ?Per PT this am pt would benefit from bariatric RW- order placed, and pt agreeable to use in house provider. Call made to [facility] for DME need. [employer] to reach out to pt regarding cost for bariatric RW- and deliver to room if pt wants to move forward w/ it.  ?1130- notified by [employer] - pt does not qualify for bariatric RW- will call pt to see if he wants to pay out of pocket or go with regular RW ?0160- pt will go with regular RW- [employer] to deliver to room  ? ?Pt's family to transport home.  ? ?[place] has been resumed w/ [facility] and they are aware of discharge for start of care.  ? ? ?Final next level of care: [place] ?Barriers to Discharge: Barriers Resolved ? ? ?Patient Goals and CMS Choice ?Patient states their goals for this hospitalization and ongoing recovery are:: return home ?CMS Medicare.gov Compare Post Acute Care list provided to:: Patient ?Choice offered to / list presented to : Patient ? ?Discharge Placement ?  ?           ? Home w/ HH ?  ?  ?  ? ?Discharge Plan and Services ?  ?Discharge Planning Services: CM Consult ?Post Acute Care Choice: Home Health, Resumption of Svcs/PTA Provider          ?DME Arranged: Walker rolling ?DME Agency: [employer] ?Date DME Agency Contacted: 12/24/21 ?Time DME Agency  Contacted: [phone] ?Representative spoke with at DME Agency: Freda Munro ?HH Arranged: RN, PT, OT ?[place] Agency: [place] ?Date HH Agency Contacted: 12/23/21 ?Time [place]: 2355 ?Representative spoke with at [place]: Tommi Rumps ? ?Social Determinants of Health (SDOH) Interventions ?  ? ? ?Readmission Risk Interventions ? ?  12/24/2021  ? 12:03 PM 12/06/2021  ? 10:01 AM  ?Readmission Risk Prevention Plan  ?Transportation Screening Complete Complete  ?PCP or Specialist Appt within 5-7 Days Complete Complete  ?Home Care Screening Complete Complete  ?Medication Review (RN CM) Complete Complete  ? ? ? ? ? ?

## 2021-12-24 NOTE — Telephone Encounter (Signed)
Patient admitted per wife .  Will fu next week to reschedule.  ?

## 2021-12-27 ENCOUNTER — Encounter: Payer: Self-pay | Admitting: Surgery

## 2021-12-27 ENCOUNTER — Ambulatory Visit: Payer: Medicare PPO | Admitting: Surgery

## 2021-12-27 ENCOUNTER — Ambulatory Visit: Payer: Medicare PPO | Admitting: Medical

## 2021-12-27 ENCOUNTER — Ambulatory Visit (INDEPENDENT_AMBULATORY_CARE_PROVIDER_SITE_OTHER): Payer: Medicare PPO | Admitting: Surgery

## 2021-12-27 VITALS — BP 87/51 | HR 44 | Temp 98.4°F | Resp 20 | Ht 68.0 in | Wt 293.0 lb

## 2021-12-27 DIAGNOSIS — I70213 Atherosclerosis of native arteries of extremities with intermittent claudication, bilateral legs: Secondary | ICD-10-CM

## 2021-12-27 NOTE — Progress Notes (Signed)
? ?Patient name: Kevin Gross MRN: [id number] DOB: 24-Mar-1956 Sex: male ? ?REASON FOR VISIT:  ? ? ? Post op ? ?HISTORY OF PRESENT ILLNESS:  ? ?Kevin Gross is a 66 y.o. male status post bilateral femoral endartetectomies and VPA.  HE developed wound infections and had multiple debridements and ultimately fish skin and vac therapy.  He is here for vac change ? ?CURRENT MEDICATIONS:  ? ? ?Current Outpatient Medications  ?Medication Sig Dispense Refill  ? acetaminophen (TYLENOL) 325 MG tablet Take 2 tablets (650 mg total) by mouth every 6 (six) hours as needed for mild pain (or Fever >/= 101). 30 tablet 0  ? albuterol (PROVENTIL HFA;VENTOLIN HFA) 108 (90 Base) MCG/ACT inhaler Inhale 1-2 puffs into the lungs every 6 (six) hours as needed for wheezing or shortness of breath.    ? allopurinol (ZYLOPRIM) 300 MG tablet Take 300 mg by mouth every morning.    ? amiodarone (PACERONE) 200 MG tablet Take 1 tablet (200 mg total) by mouth 2 (two) times daily. 30 tablet 2  ? aspirin EC 81 MG tablet Take 81 mg by mouth every morning.    ? atenolol (TENORMIN) 25 MG tablet Take 25 mg by mouth every morning.    ? chlorthalidone (HYGROTON) 25 MG tablet Take 25 mg by mouth every morning.    ? clopidogrel (PLAVIX) 75 MG tablet Take 1 tablet (75 mg total) by mouth daily. 30 tablet 2  ? Docusate Calcium (STOOL SOFTENER PO) Take 1 capsule by mouth daily as needed (constipation).    ? ezetimibe (ZETIA) 10 MG tablet Take 1 tablet (10 mg total) by mouth daily. 90 tablet 3  ? fluticasone (FLONASE) 50 MCG/ACT nasal spray Place 2 sprays into both nostrils daily as needed for allergies or rhinitis.    ? lisinopril (ZESTRIL) 10 MG tablet Take 10 mg by mouth every morning.    ? oxyCODONE-acetaminophen (PERCOCET/ROXICET) 5-325 MG tablet Take 1 tablet by mouth every 6 (six) hours as needed for moderate pain. 30 tablet 0  ? predniSONE (DELTASONE) 10 MG tablet Take 10-40 mg by mouth See admin instructions. Take as  needed at the onset of a gout flare, take 40 mg on day 1, 30 mg on day 2, 20 mg on day 3, 10 mg on day 4, then stop    ? silver sulfADIAZINE (SILVADENE) 1 % cream Apply topically 2 (two) times daily. 85 g 0  ? simvastatin (ZOCOR) 20 MG tablet Take 20 mg by mouth every evening.    ? tamsulosin (FLOMAX) 0.4 MG CAPS capsule Take 0.4 mg by mouth daily after supper.    ? triamcinolone cream (KENALOG) 0.1 % Apply 1 application. topically daily as needed (irritation on foot).    ? VITAMIN D-1000 MAX ST 25 MCG (1000 UT) tablet Take 2,000 Units by mouth daily.    ? zolpidem (AMBIEN) 10 MG tablet Take 10 mg by mouth at bedtime.    ? ?No current facility-administered medications for this visit.  ? ? ?REVIEW OF SYSTEMS:  ? ?'[X]'$  denotes positive finding, '[ ]'$  denotes negative finding ?Cardiac  Comments:  ?Chest pain or chest pressure:    ?Shortness of breath upon exertion:    ?Short of breath when lying flat:    ?Irregular heart rhythm:    ?Constitutional    ?Fever or chills:    ? ? ?PHYSICAL EXAM:  ? ?Vitals:  ? 12/27/21 1518  ?BP: (!) 87/51  ?Pulse: (!) 44  ?Resp: 20  ?Temp: 98.4 ?  F (36.9 ?C)  ?SpO2: 98%  ?Weight: 293 lb (132.9 kg)  ?Height: '5\' 8"'$  (1.727 m)  ? ? ?GENERAL: The patient is a well-nourished male, in no acute distress. The vital signs are documented above. ?CARDIOVASCULAR: There is a regular rate and rhythm. ?PULMONARY: Non-labored respirations ? ? ? ? ? ?STUDIES:  ? ? ? ? ?MEDICAL ISSUES:  ? ?F/u 1 weeks for vac change ? ?Annamarie Major, IV, MD, FACS ?[facility] [place] ?Tel [phone] ?Pager [phone] ? ? ?  ?

## 2021-12-28 ENCOUNTER — Encounter (HOSPITAL_COMMUNITY): Payer: Self-pay | Admitting: Surgery

## 2022-01-03 ENCOUNTER — Encounter: Payer: Self-pay | Admitting: Surgery

## 2022-01-03 ENCOUNTER — Ambulatory Visit: Payer: Medicare PPO | Admitting: Surgery

## 2022-01-03 ENCOUNTER — Other Ambulatory Visit: Payer: Self-pay

## 2022-01-03 ENCOUNTER — Telehealth: Payer: Self-pay

## 2022-01-03 DIAGNOSIS — I70213 Atherosclerosis of native arteries of extremities with intermittent claudication, bilateral legs: Secondary | ICD-10-CM | POA: Diagnosis not present

## 2022-01-03 DIAGNOSIS — T8242XD Displacement of vascular dialysis catheter, subsequent encounter: Secondary | ICD-10-CM

## 2022-01-03 MED ORDER — OXYCODONE HCL 5 MG PO TABS: 5.0000 mg | ORAL_TABLET | Freq: Three times a day (TID) | ORAL | 0 refills | Status: DC | PRN

## 2022-01-03 NOTE — Telephone Encounter (Signed)
Transitions of Care Pharmacy  ? ?Call attempted for a pharmacy transitions of care follow-up. Patient was currently in a doctor's appointment. HIPAA appropriate voicemail was left with call back information provided.  ? ?Call attempt #1. Will follow-up in 2-3 days.  ?  ?

## 2022-01-03 NOTE — Progress Notes (Signed)
? ?Patient name: Kevin Gross MRN: [id number] DOB: February 15, 1956 Sex: male ? ?REASON FOR VISIT:  ? ? ? Post op wound check ? ?HISTORY OF PRESENT ILLNESS:  ? ?Kevin Gross is a 66 y.o. male status post bilateral femoral endartetectomies and VPA.  HE developed wound infections and had multiple debridements and ultimately fish skin and vac therapy.  He is here for vac change. ? ?CURRENT MEDICATIONS:  ? ? ?Current Outpatient Medications  ?Medication Sig Dispense Refill  ? acetaminophen (TYLENOL) 325 MG tablet Take 2 tablets (650 mg total) by mouth every 6 (six) hours as needed for mild pain (or Fever >/= 101). 30 tablet 0  ? albuterol (PROVENTIL HFA;VENTOLIN HFA) 108 (90 Base) MCG/ACT inhaler Inhale 1-2 puffs into the lungs every 6 (six) hours as needed for wheezing or shortness of breath.    ? allopurinol (ZYLOPRIM) 300 MG tablet Take 300 mg by mouth every morning.    ? amiodarone (PACERONE) 200 MG tablet Take 1 tablet (200 mg total) by mouth 2 (two) times daily. 30 tablet 2  ? aspirin EC 81 MG tablet Take 81 mg by mouth every morning.    ? atenolol (TENORMIN) 25 MG tablet Take 25 mg by mouth every morning.    ? chlorthalidone (HYGROTON) 25 MG tablet Take 25 mg by mouth every morning.    ? clopidogrel (PLAVIX) 75 MG tablet Take 1 tablet (75 mg total) by mouth daily. 30 tablet 2  ? Docusate Calcium (STOOL SOFTENER PO) Take 1 capsule by mouth daily as needed (constipation).    ? ezetimibe (ZETIA) 10 MG tablet Take 1 tablet (10 mg total) by mouth daily. 90 tablet 3  ? fluticasone (FLONASE) 50 MCG/ACT nasal spray Place 2 sprays into both nostrils daily as needed for allergies or rhinitis.    ? lisinopril (ZESTRIL) 10 MG tablet Take 10 mg by mouth every morning.    ? oxyCODONE-acetaminophen (PERCOCET/ROXICET) 5-325 MG tablet Take 1 tablet by mouth every 6 (six) hours as needed for moderate pain. 30 tablet 0  ? predniSONE (DELTASONE) 10 MG tablet Take 10-40 mg by mouth See admin  instructions. Take as needed at the onset of a gout flare, take 40 mg on day 1, 30 mg on day 2, 20 mg on day 3, 10 mg on day 4, then stop    ? silver sulfADIAZINE (SILVADENE) 1 % cream Apply topically 2 (two) times daily. 85 g 0  ? simvastatin (ZOCOR) 20 MG tablet Take 20 mg by mouth every evening.    ? tamsulosin (FLOMAX) 0.4 MG CAPS capsule Take 0.4 mg by mouth daily after supper.    ? triamcinolone cream (KENALOG) 0.1 % Apply 1 application. topically daily as needed (irritation on foot).    ? VITAMIN D-1000 MAX ST 25 MCG (1000 UT) tablet Take 2,000 Units by mouth daily.    ? zolpidem (AMBIEN) 10 MG tablet Take 10 mg by mouth at bedtime.    ? ?No current facility-administered medications for this visit.  ? ? ?REVIEW OF SYSTEMS:  ? ?'[X]'$  denotes positive finding, '[ ]'$  denotes negative finding ?Cardiac  Comments:  ?Chest pain or chest pressure:    ?Shortness of breath upon exertion:    ?Short of breath when lying flat:    ?Irregular heart rhythm:    ?Constitutional    ?Fever or chills:    ? ? ?PHYSICAL EXAM:  ? ? ?GENERAL: The patient is a well-nourished male, in no acute distress. The vital signs are documented  above. ?CARDIOVASCULAR: There is a regular rate and rhythm. ?PULMONARY: Non-labored respirations ? ? ? ? ? ?STUDIES:  ? ? ? ? ?MEDICAL ISSUES:  ? ?Wounds are improving.  I cleaned out the wounds with a Curlex.  He is getting good granulation tissue and the area appears to be filling in.  The wounds are improving.  The left measured 7 x 6 x 4 cm and the right was 6 x 5 x 2 cm.  An additional xenograft (for skin was placed in each groin and the wound VAC was replaced.  I also placed Kerecis powder into each groin, using 38 PACs.  He will follow-up in 1 week for another dressing change. ? ?Annamarie Major, IV, MD, FACS ?[facility] [place] ?Tel [phone] ?Pager [phone] ? ? ?  ?

## 2022-01-04 ENCOUNTER — Other Ambulatory Visit (HOSPITAL_COMMUNITY): Payer: Self-pay

## 2022-01-04 ENCOUNTER — Telehealth (HOSPITAL_COMMUNITY): Payer: Self-pay

## 2022-01-04 ENCOUNTER — Other Ambulatory Visit: Payer: Self-pay | Admitting: Physician Assistant

## 2022-01-04 MED ORDER — OXYCODONE-ACETAMINOPHEN 5-325 MG PO TABS
1.0000 | ORAL_TABLET | Freq: Three times a day (TID) | ORAL | 0 refills | Status: DC | PRN
Start: 2022-01-04 — End: 2022-09-09

## 2022-01-04 NOTE — Progress Notes (Signed)
Pt called and was unable to get his pain medication at the selected pharmacy.  I have resent the same prescription to [facility] in [place].   ? ?PDMP reviewed.  ? ?[name], PAC ?01/04/2022 ?3:12 PM ? ?

## 2022-01-04 NOTE — Telephone Encounter (Signed)
Transitions of Care Pharmacy  ? ?Call attempted for a pharmacy transitions of care follow-up. HIPAA appropriate voicemail was left with call back information provided.  ? ?Call attempt #2. Will follow-up in 2-3 days.  ? ?Darcus Austin, PharmD ?Clinical Pharmacist ?[facility] ?01/04/2022 11:01 AM ? ?  ?

## 2022-01-05 ENCOUNTER — Other Ambulatory Visit (HOSPITAL_COMMUNITY): Payer: Self-pay

## 2022-01-05 ENCOUNTER — Telehealth (HOSPITAL_COMMUNITY): Payer: Self-pay

## 2022-01-05 NOTE — Telephone Encounter (Signed)
Transitions of Care Pharmacy  ? ?Call attempted for a pharmacy transitions of care follow-up. Patient was asleep and caregiver was given info on who to call if the patient had any questions. ? ?Call attempt #3. Final attempt. ? ?Darcus Austin, PharmD ?Clinical Pharmacist ?[facility] ?01/05/2022 3:22 PM ? ?

## 2022-01-10 ENCOUNTER — Ambulatory Visit (INDEPENDENT_AMBULATORY_CARE_PROVIDER_SITE_OTHER): Payer: Medicare PPO | Admitting: Surgery

## 2022-01-10 ENCOUNTER — Encounter: Payer: Self-pay | Admitting: Surgery

## 2022-01-10 VITALS — BP 120/67 | HR 100 | Temp 98.6°F | Resp 20 | Ht 68.0 in | Wt 293.0 lb

## 2022-01-10 DIAGNOSIS — I70213 Atherosclerosis of native arteries of extremities with intermittent claudication, bilateral legs: Secondary | ICD-10-CM

## 2022-01-10 NOTE — Progress Notes (Signed)
? ?Patient name: Kevin Gross MRN: [id number] DOB: Nov 16, 1955 Sex: male ? ?REASON FOR VISIT:  ? ? ? Vac change ? ?HISTORY OF PRESENT ILLNESS:  ? ?Kevin Gross is a 66 y.o. male status post bilateral femoral endartetectomies and VPA.  HE developed wound infections and had multiple debridements and ultimately fish skin and vac therapy.  He is here for vac change.  For skin was replaced at his last visit ? ?CURRENT MEDICATIONS:  ? ? ?Current Outpatient Medications  ?Medication Sig Dispense Refill  ? acetaminophen (TYLENOL) 325 MG tablet Take 2 tablets (650 mg total) by mouth every 6 (six) hours as needed for mild pain (or Fever >/= 101). 30 tablet 0  ? albuterol (PROVENTIL HFA;VENTOLIN HFA) 108 (90 Base) MCG/ACT inhaler Inhale 1-2 puffs into the lungs every 6 (six) hours as needed for wheezing or shortness of breath.    ? allopurinol (ZYLOPRIM) 300 MG tablet Take 300 mg by mouth every morning.    ? amiodarone (PACERONE) 200 MG tablet Take 1 tablet (200 mg total) by mouth 2 (two) times daily. 30 tablet 2  ? aspirin EC 81 MG tablet Take 81 mg by mouth every morning.    ? atenolol (TENORMIN) 25 MG tablet Take 25 mg by mouth every morning.    ? chlorthalidone (HYGROTON) 25 MG tablet Take 25 mg by mouth every morning.    ? clopidogrel (PLAVIX) 75 MG tablet Take 1 tablet (75 mg total) by mouth daily. 30 tablet 2  ? Docusate Calcium (STOOL SOFTENER PO) Take 1 capsule by mouth daily as needed (constipation).    ? ezetimibe (ZETIA) 10 MG tablet Take 1 tablet (10 mg total) by mouth daily. 90 tablet 3  ? fluticasone (FLONASE) 50 MCG/ACT nasal spray Place 2 sprays into both nostrils daily as needed for allergies or rhinitis.    ? lisinopril (ZESTRIL) 10 MG tablet Take 10 mg by mouth every morning.    ? naloxone (NARCAN) nasal spray 4 mg/0.1 mL SMARTSIG:Both Nares    ? oxyCODONE (OXY IR/ROXICODONE) 5 MG immediate release tablet Take 1 tablet (5 mg total) by mouth every 8 (eight) hours as needed  for severe pain. 20 tablet 0  ? oxyCODONE-acetaminophen (PERCOCET) 5-325 MG tablet Take 1 tablet by mouth every 8 (eight) hours as needed for severe pain. 20 tablet 0  ? predniSONE (DELTASONE) 10 MG tablet Take 10-40 mg by mouth See admin instructions. Take as needed at the onset of a gout flare, take 40 mg on day 1, 30 mg on day 2, 20 mg on day 3, 10 mg on day 4, then stop    ? silver sulfADIAZINE (SILVADENE) 1 % cream Apply topically 2 (two) times daily. 85 g 0  ? simvastatin (ZOCOR) 20 MG tablet Take 20 mg by mouth every evening.    ? tamsulosin (FLOMAX) 0.4 MG CAPS capsule Take 0.4 mg by mouth daily after supper.    ? triamcinolone cream (KENALOG) 0.1 % Apply 1 application. topically daily as needed (irritation on foot).    ? VITAMIN D-1000 MAX ST 25 MCG (1000 UT) tablet Take 2,000 Units by mouth daily.    ? zolpidem (AMBIEN) 10 MG tablet Take 10 mg by mouth at bedtime.    ? ?No current facility-administered medications for this visit.  ? ? ?REVIEW OF SYSTEMS:  ? ?'[X]'$  denotes positive finding, '[ ]'$  denotes negative finding ?Cardiac  Comments:  ?Chest pain or chest pressure:    ?Shortness of breath upon exertion:    ?  Short of breath when lying flat:    ?Irregular heart rhythm:    ?Constitutional    ?Fever or chills:    ? ? ?PHYSICAL EXAM:  ? ?Vitals:  ? 01/10/22 1509  ?BP: 120/67  ?Pulse: 100  ?Resp: 20  ?Temp: 98.6 ?F (37 ?C)  ?TempSrc: Temporal  ?SpO2: 100%  ?Weight: 293 lb (132.9 kg)  ?Height: '5\' 8"'$  (1.727 m)  ? ? ?GENERAL: The patient is a well-nourished male, in no acute distress. The vital signs are documented above. ?CARDIOVASCULAR: There is a regular rate and rhythm. ?PULMONARY: Non-labored respirations ? ? ? ? ? ?STUDIES:  ? ? ? ? ?MEDICAL ISSUES:  ? ?Wounds appear to be healing with good granulation tissue.  I am going to try him with what to do dry dressing changes, as he is having some technical issues with the wound VAC.  If he has too much drainage, I will need to replace the wound VAC which can be  done by home health using a black sponge.  He will return in 2 weeks for wound check ? ?Annamarie Major, IV, MD, FACS ?[facility] ?Tel [phone] ?Pager [phone] ? ? ?  ?

## 2022-01-11 ENCOUNTER — Telehealth: Payer: Self-pay

## 2022-01-11 NOTE — Telephone Encounter (Signed)
Tammy with [facility] called to get wound care orders. Per APP, if she can get a seal on wound vac ideally they would like for that to be on. If not, they can do daily wet to dry dressing changes.  ?

## 2022-01-25 ENCOUNTER — Other Ambulatory Visit (HOSPITAL_COMMUNITY): Payer: Self-pay

## 2022-01-25 ENCOUNTER — Ambulatory Visit (INDEPENDENT_AMBULATORY_CARE_PROVIDER_SITE_OTHER): Payer: Medicare PPO | Admitting: Physician Assistant

## 2022-01-25 VITALS — BP 105/68 | HR 93 | Temp 97.0°F | Resp 18 | Ht 68.0 in | Wt 274.0 lb

## 2022-01-25 DIAGNOSIS — T8189XS Other complications of procedures, not elsewhere classified, sequela: Secondary | ICD-10-CM

## 2022-01-25 DIAGNOSIS — I70213 Atherosclerosis of native arteries of extremities with intermittent claudication, bilateral legs: Secondary | ICD-10-CM

## 2022-01-25 NOTE — Progress Notes (Signed)
POST OPERATIVE OFFICE NOTE    CC:  F/u for surgery  HPI:  This is a 66 y.o. male who is s/p B iliofemoral endarterectomy with vein patch angioplasty.  B common and ext iliac stents.  He then developed B groin wounds that required debridement with x 3 with application of xenograft and wound vac placement.   He is here today for wound check.  He is doing wet to dry dressing changes daily.     No Known Allergies  Current Outpatient Medications  Medication Sig Dispense Refill   acetaminophen (TYLENOL) 325 MG tablet Take 2 tablets (650 mg total) by mouth every 6 (six) hours as needed for mild pain (or Fever >/= 101). 30 tablet 0   albuterol (PROVENTIL HFA;VENTOLIN HFA) 108 (90 Base) MCG/ACT inhaler Inhale 1-2 puffs into the lungs every 6 (six) hours as needed for wheezing or shortness of breath.     allopurinol (ZYLOPRIM) 300 MG tablet Take 300 mg by mouth every morning.     amiodarone (PACERONE) 200 MG tablet Take 1 tablet (200 mg total) by mouth 2 (two) times daily. 30 tablet 2   aspirin EC 81 MG tablet Take 81 mg by mouth every morning.     atenolol (TENORMIN) 25 MG tablet Take 25 mg by mouth every morning.     chlorthalidone (HYGROTON) 25 MG tablet Take 25 mg by mouth every morning.     clopidogrel (PLAVIX) 75 MG tablet Take 1 tablet (75 mg total) by mouth daily. 30 tablet 2   Docusate Calcium (STOOL SOFTENER PO) Take 1 capsule by mouth daily as needed (constipation).     ezetimibe (ZETIA) 10 MG tablet Take 1 tablet (10 mg total) by mouth daily. 90 tablet 3   fluticasone (FLONASE) 50 MCG/ACT nasal spray Place 2 sprays into both nostrils daily as needed for allergies or rhinitis.     lisinopril (ZESTRIL) 10 MG tablet Take 10 mg by mouth every morning.     naloxone (NARCAN) nasal spray 4 mg/0.1 mL SMARTSIG:Both Nares     oxyCODONE (OXY IR/ROXICODONE) 5 MG immediate release tablet Take 1 tablet (5 mg total) by mouth every 8 (eight) hours as needed for severe pain. 20 tablet 0    oxyCODONE-acetaminophen (PERCOCET) 5-325 MG tablet Take 1 tablet by mouth every 8 (eight) hours as needed for severe pain. 20 tablet 0   predniSONE (DELTASONE) 10 MG tablet Take 10-40 mg by mouth See admin instructions. Take as needed at the onset of a gout flare, take 40 mg on day 1, 30 mg on day 2, 20 mg on day 3, 10 mg on day 4, then stop     silver sulfADIAZINE (SILVADENE) 1 % cream Apply topically 2 (two) times daily. 85 g 0   simvastatin (ZOCOR) 20 MG tablet Take 20 mg by mouth every evening.     tamsulosin (FLOMAX) 0.4 MG CAPS capsule Take 0.4 mg by mouth daily after supper.     triamcinolone cream (KENALOG) 0.1 % Apply 1 application. topically daily as needed (irritation on foot).     VITAMIN D-1000 MAX ST 25 MCG (1000 UT) tablet Take 2,000 Units by mouth daily.     zolpidem (AMBIEN) 10 MG tablet Take 10 mg by mouth at bedtime.     No current facility-administered medications for this visit.     ROS:  See HPI  Physical Exam:       Beefy red base, greenish drainage on dressing which indicated pseudomonas  Assessment/Plan:  This is a 66 y.o. male who is s/p:B iliofemoral endarterectomy with vein patch angioplasty.  B common and ext iliac stents.  He then developed B groin wounds that required debridement with x 3 with application of xenograft and wound vac placement.    He is doing well over all and has started wet to dry dressing changes since his last visit on 01/10/22.      The dressing had evidence of pseudomonas turning the dressing green.  I have prescribed Dakins solution wet to dry dressing.  He will f/u in 2-3 weeks for wound checks.  If he develops new issues he will call sooner.    Roxy Horseman  P-AC [facility] [phone]   Clinic MD:  Carlis Abbott

## 2022-02-07 NOTE — Progress Notes (Signed)
POST OPERATIVE OFFICE NOTE    CC:  F/u for surgery  HPI:  This is a 66 y.o. male who is s/p  B iliofemoral endarterectomy with vein patch angioplasty.  B common and ext iliac stents.  He then developed B groin wounds that required debridement with x 3 with application of xenograft and wound vac placement.              He is here today for wound check.  He is doing wet to dry dressing changes daily.    No Known Allergies  Current Outpatient Medications  Medication Sig Dispense Refill   acetaminophen (TYLENOL) 325 MG tablet Take 2 tablets (650 mg total) by mouth every 6 (six) hours as needed for mild pain (or Fever >/= 101). 30 tablet 0   albuterol (PROVENTIL HFA;VENTOLIN HFA) 108 (90 Base) MCG/ACT inhaler Inhale 1-2 puffs into the lungs every 6 (six) hours as needed for wheezing or shortness of breath.     allopurinol (ZYLOPRIM) 300 MG tablet Take 300 mg by mouth every morning.     amiodarone (PACERONE) 200 MG tablet Take 1 tablet (200 mg total) by mouth 2 (two) times daily. 30 tablet 2   aspirin EC 81 MG tablet Take 81 mg by mouth every morning.     atenolol (TENORMIN) 25 MG tablet Take 25 mg by mouth every morning.     chlorthalidone (HYGROTON) 25 MG tablet Take 25 mg by mouth every morning.     clopidogrel (PLAVIX) 75 MG tablet Take 1 tablet (75 mg total) by mouth daily. 30 tablet 2   Docusate Calcium (STOOL SOFTENER PO) Take 1 capsule by mouth daily as needed (constipation).     ezetimibe (ZETIA) 10 MG tablet Take 1 tablet (10 mg total) by mouth daily. 90 tablet 3   fluticasone (FLONASE) 50 MCG/ACT nasal spray Place 2 sprays into both nostrils daily as needed for allergies or rhinitis.     lisinopril (ZESTRIL) 10 MG tablet Take 10 mg by mouth every morning.     naloxone (NARCAN) nasal spray 4 mg/0.1 mL SMARTSIG:Both Nares     oxyCODONE (OXY IR/ROXICODONE) 5 MG immediate release tablet Take 1 tablet (5 mg total) by mouth every 8 (eight) hours as needed for severe pain. 20 tablet 0    oxyCODONE-acetaminophen (PERCOCET) 5-325 MG tablet Take 1 tablet by mouth every 8 (eight) hours as needed for severe pain. 20 tablet 0   predniSONE (DELTASONE) 10 MG tablet Take 10-40 mg by mouth See admin instructions. Take as needed at the onset of a gout flare, take 40 mg on day 1, 30 mg on day 2, 20 mg on day 3, 10 mg on day 4, then stop     silver sulfADIAZINE (SILVADENE) 1 % cream Apply topically 2 (two) times daily. 85 g 0   simvastatin (ZOCOR) 20 MG tablet Take 20 mg by mouth every evening.     tamsulosin (FLOMAX) 0.4 MG CAPS capsule Take 0.4 mg by mouth daily after supper.     triamcinolone cream (KENALOG) 0.1 % Apply 1 application. topically daily as needed (irritation on foot).     VITAMIN D-1000 MAX ST 25 MCG (1000 UT) tablet Take 2,000 Units by mouth daily.     zolpidem (AMBIEN) 10 MG tablet Take 10 mg by mouth at bedtime.     No current facility-administered medications for this visit.     ROS:  See HPI  Physical Exam:       Incision:  wounds  are decreasing in size and they have beefy red granulation tissue.  The dressing packing continues to have greenish drainage     Assessment/Plan:  This is a 66 y.o. male who is s/p:B iliofemoral endarterectomy with vein patch angioplasty.  B common and ext iliac stents.  He then developed B groin wounds that required debridement with x 3 with application of xenograft and wound vac placement.    He still has  evidence of pseudomonas turning the dressing green.  I have prescribed Dakins solution wet to dry dressing.  He will f/u in 2-3 weeks for wound checks.  F/u in 3-4 weeks for wound check.  Activity as tolerates and wash around wounds with soap and water.  Keep groin creases dry between bathing   Roxy Horseman PA-C [facility] [phone]   Clinic MD:  Stanford Breed

## 2022-02-08 ENCOUNTER — Ambulatory Visit (INDEPENDENT_AMBULATORY_CARE_PROVIDER_SITE_OTHER): Payer: Medicare PPO | Admitting: Physician Assistant

## 2022-02-08 VITALS — BP 125/68 | HR 70 | Temp 98.1°F | Resp 18 | Ht 69.0 in | Wt 275.4 lb

## 2022-02-08 DIAGNOSIS — T8189XS Other complications of procedures, not elsewhere classified, sequela: Secondary | ICD-10-CM

## 2022-02-17 ENCOUNTER — Ambulatory Visit: Payer: Medicare PPO | Admitting: Medical

## 2022-03-03 ENCOUNTER — Ambulatory Visit (INDEPENDENT_AMBULATORY_CARE_PROVIDER_SITE_OTHER): Payer: Medicare PPO | Admitting: Physician Assistant

## 2022-03-03 VITALS — BP 126/57 | HR 53 | Temp 98.2°F | Resp 18 | Ht 68.0 in | Wt 281.0 lb

## 2022-03-03 DIAGNOSIS — T8189XS Other complications of procedures, not elsewhere classified, sequela: Secondary | ICD-10-CM

## 2022-03-03 NOTE — Progress Notes (Signed)
POST OPERATIVE OFFICE NOTE    CC:  F/u for surgery  HPI:  This is a 66 y.o. male who has a history of life limiting bilateral LE claudication with ABI 0.6 bilaterally..   s/p B iliofemoral endarterectomy with vein patch angioplasty.  B common and ext iliac stents.  He then developed B groin wounds that required debridement with x 3 with application of xenograft and wound vac placement. 12/22/21.   Previous angiogram by Dr. 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   The wound vac was discontinues and he has been using wet to dry with Dakin solution for pseudomonas.    Pt returns today for follow up.  Pt states he has no claudication symptoms and is walking a lot each day.  He has been doing wet to dry dressing changes QD.     No Known Allergies  Current Outpatient Medications  Medication Sig Dispense Refill   acetaminophen (TYLENOL) 325 MG tablet Take 2 tablets (650 mg total) by mouth every 6 (six) hours as needed for mild pain (or Fever >/= 101). 30 tablet 0   albuterol (PROVENTIL HFA;VENTOLIN HFA) 108 (90 Base) MCG/ACT inhaler Inhale 1-2 puffs into the lungs every 6 (six) hours as needed for wheezing or shortness of breath.     allopurinol (ZYLOPRIM) 300 MG tablet Take 300 mg by mouth every morning.     amiodarone (PACERONE) 200 MG tablet Take 1 tablet (200 mg total) by mouth 2 (two) times daily. 30 tablet 2   aspirin EC 81 MG tablet Take 81 mg by mouth every morning.     atenolol (TENORMIN) 25 MG tablet Take 25 mg by mouth every morning. (Patient not taking: Reported on 02/08/2022)     chlorthalidone (HYGROTON) 25 MG tablet Take 25 mg by mouth every morning.     clopidogrel (PLAVIX) 75 MG tablet Take 1 tablet (75 mg total) by mouth daily. 30 tablet 2   Docusate Calcium (STOOL SOFTENER PO) Take 1 capsule by mouth daily as  needed (constipation).     ezetimibe (ZETIA) 10 MG tablet Take 1 tablet (10 mg total) by mouth daily. 90 tablet 3   fluticasone (FLONASE) 50 MCG/ACT nasal spray Place 2 sprays into both nostrils daily as needed for allergies or rhinitis.     lisinopril (ZESTRIL) 10 MG tablet Take 10 mg by mouth every morning.     naloxone (NARCAN) nasal spray 4 mg/0.1 mL SMARTSIG:Both Nares     oxyCODONE (OXY IR/ROXICODONE) 5 MG immediate release tablet Take 1 tablet (5 mg total) by mouth every 8 (eight) hours as needed for severe pain. 20 tablet 0   oxyCODONE-acetaminophen (PERCOCET) 5-325 MG tablet Take 1 tablet by mouth every 8 (eight) hours as needed for severe pain. 20 tablet 0   predniSONE (DELTASONE) 10 MG tablet Take 10-40 mg by mouth See admin instructions. Take as needed at the onset of a gout flare, take 40 mg on day 1, 30 mg on day 2, 20 mg on day 3, 10 mg on day 4, then stop     silver sulfADIAZINE (SILVADENE) 1 % cream Apply topically 2 (two) times daily. 85 g 0   simvastatin (ZOCOR) 20 MG tablet Take 20 mg by mouth every evening.     tamsulosin (FLOMAX) 0.4 MG CAPS capsule Take 0.4 mg by mouth daily after supper.  triamcinolone cream (KENALOG) 0.1 % Apply 1 application. topically daily as needed (irritation on foot).     VITAMIN D-1000 MAX ST 25 MCG (1000 UT) tablet Take 2,000 Units by mouth daily.     zolpidem (AMBIEN) 10 MG tablet Take 10 mg by mouth at bedtime.     No current facility-administered medications for this visit.     ROS:  See HPI  Physical Exam:       Healing well with beefy red bases to bilateral groin wounds.    Assessment/Plan:  This is a 66 y.o. male who is s/p: 66 y.o. male who is s/p:B iliofemoral endarterectomy with vein patch angioplasty.  B common and ext iliac stents.  He then developed B groin wounds that required debridement with x 3 with application of xenograft.  -He will continue wet to dry with Dakin's solution.  Then he can go to saline wet to dry  once the dakin's is done.  He will f/u in 4 weeks with ABI's for a new baseline.  Activity as tolerates and shower PRN.     Roxy Horseman  [facility] [phone]   Clinic MD:  Scot Dock

## 2022-03-04 ENCOUNTER — Other Ambulatory Visit: Payer: Self-pay

## 2022-03-04 DIAGNOSIS — I739 Peripheral vascular disease, unspecified: Secondary | ICD-10-CM

## 2022-04-01 ENCOUNTER — Telehealth: Payer: Self-pay | Admitting: *Deleted

## 2022-04-01 NOTE — Telephone Encounter (Signed)
Tammy called, nurse with [facility] stating patients left groin wound has healed to the point that a packing can no longer be placed.  Patient's appointment is  04/07/2022 for follow up.

## 2022-04-01 NOTE — Telephone Encounter (Deleted)
Tammy called, nurse with [facility] stating patients left groin wound has healed to the point that a packing can no longer be placed.  Size is .4 Progression Recent Vital Signs   '@VS'$ @   Past Medical History:  Diagnosis Date   Alcoholic cirrhosis of liver (HCC)    CHF (congestive heart failure) (HCC)    Clotting disorder (HCC)    Bilateral legs-pt states plaque build up in both legs requiring intervention   Complication of anesthesia    pt states that years ago during colonoscopy at [facility] they had to abort the procedure due to some "cardiac issue" had to see cardiologist   COPD (chronic obstructive pulmonary disease) ([place])    Hyperlipidemia    Hypertension    Pre-diabetes    Sleep apnea    uses cpap     Expected Discharge Date  '@FLOW'$ (923300::7)@  Diet Order     None        VTE Documentation  '@FLOW'$ (6226333::5)@   Work Intensity Score/Level of Care  '@FLOW'$ (10536::1)@  '@LEVELOFCARE'$ @   Mobility  '@FLOW'$ (7060220::1)@     Significant Events    DC Barriers   Abnormal Labs:  Madalyn Rob 04/01/2022, 4:10 PM

## 2022-04-06 NOTE — Progress Notes (Unsigned)
HISTORY AND PHYSICAL     CC:  follow up. Requesting Provider:  Rutherford Limerick, PA  HPI: This is a 66 y.o. male who is here today for follow up for PAD.  Pt has hx of: #1: Right iliofemoral endarterectomy with vein patch angioplasty #2: Left iliofemoral endarterectomy with vein patch angioplasty #3: Stent, left common iliac artery #4: Stent, left external iliac artery #5: Intra-arterial lithotripsy: Left common and external iliac arteries #6: Intra-arterial lithotripsy: Right common and external iliac arteries #7: Stent, right external iliac artery #8: Stent, right common femoral artery On 11/26/2021 by Dr. Trula Slade  He was discharged on 12/06/2021.  He was admitted a week latera with bilateral post surgical groin wounds and was taken to the OR on 12/14/2021 and underwent bilateral groin debridement with antibiotic bead placement and vac placement on 12/14/2021 by Dr. Virl Cagey.  He was taken back to the OR on 12/16/2021 and underwent I&D of bilateral groin wounds with vac placement by Dr. Trula Slade.   He again was taken back to the OR on 12/22/2021 for I&D of bilateral groin wounds and application of xenograft and vac placement by Dr. Trula Slade.    Pt was last seen 03/03/2022 and at that time, the wound vacs had been discontinued and was doing wet to dry dressing changes with Dakin solution for pseudomonas.  He was not having claudication and was walking each day.  He was scheduled for 4 week follow up with new baseline ABI.    The pt returns today for follow up.  He states that his preoperative symptoms have resolved.  He is able to go to the store without stopping while shopping.  He denies any claudication, rest pain or wounds.  He states that his groin wounds have completely healed.  He has not complaints.  He is compliant with his asa/statin/plavix  The pt is on a statin for cholesterol management.    The pt is on an aspirin.    Other AC:  plavix The pt is on BB, ACEI for hypertension.  The pt  does not have diabetes. Tobacco hx:  former    Past Medical History:  Diagnosis Date   Alcoholic cirrhosis of liver (HCC)    CHF (congestive heart failure) (HCC)    Clotting disorder (HCC)    Bilateral legs-pt states plaque build up in both legs requiring intervention   Complication of anesthesia    pt states that years ago during colonoscopy at [facility] they had to abort the procedure due to some "cardiac issue" had to see cardiologist   COPD (chronic obstructive pulmonary disease) ([place])    Hyperlipidemia    Hypertension    Pre-diabetes    Sleep apnea    uses cpap    Past Surgical History:  Procedure Laterality Date   ABDOMINAL AORTOGRAM W/LOWER EXTREMITY N/A 10/20/2021   Procedure: ABDOMINAL AORTOGRAM W/LOWER EXTREMITY;  Surgeon: Wellington Hampshire, MD;  Location: [place] [facility];  Service: Cardiovascular;  Laterality: N/A;   ALLOGRAFT APPLICATION Bilateral 6/38/4665   Procedure: ALLOGRAFT APPLICATION;  Surgeon: Serafina Mitchell, MD;  Location: [place];  Service: Vascular;  Laterality: Bilateral;   ANGIOPLASTY Left 11/26/2021   Procedure: LEFT INFRARENAL LITHOTRIPSY;  Surgeon: Serafina Mitchell, MD;  Location: [place];  Service: Vascular;  Laterality: Left;   APPLICATION OF WOUND VAC Bilateral 12/13/2021   Procedure: APPLICATION OF WOUND VAC;  Surgeon: Broadus John, MD;  Location: [place];  Service: Vascular;  Laterality: Bilateral;   CARDIAC CATHETERIZATION  [facility]   COLONOSCOPY     COLONOSCOPY WITH PROPOFOL N/A 01/17/2018   Procedure: COLONOSCOPY WITH PROPOFOL;  Surgeon: Toledo, Benay Pike, MD;  Location: [facility];  Service: Gastroenterology;  Laterality: N/A;   ENDARTERECTOMY FEMORAL Bilateral 11/26/2021   Procedure: BILATERAL FEMORAL ENDARTERECTOMY WITH VEIN PATCH ANGIOPLASTY;  Surgeon: Serafina Mitchell, MD;  [facility];  Service: Vascular;  Laterality: Bilateral;   ESOPHAGOGASTRODUODENOSCOPY (EGD) WITH PROPOFOL N/A 01/17/2018   Procedure: ESOPHAGOGASTRODUODENOSCOPY  (EGD) WITH PROPOFOL;  Surgeon: Toledo, Benay Pike, MD;  Location: [facility];  Service: Gastroenterology;  Laterality: N/A;   GROIN DEBRIDEMENT Bilateral 12/16/2021   Procedure: IRRIGATION AND DEBRIDEMENT OF BILATERAL GROINS;  Surgeon: Serafina Mitchell, MD;  Location: [place];  Service: Vascular;  Laterality: Bilateral;   INCISION AND DRAINAGE OF WOUND Bilateral 12/13/2021   Procedure: IRRIGATION AND DEBRIDEMENT OF BILATERAL GROIN WOUNDS;  Surgeon: Broadus John, MD;  Location: [place];  Service: Vascular;  Laterality: Bilateral;   INSERTION OF ILIAC STENT Bilateral 11/26/2021   Procedure: BILATERAL ILIAC STENTING USING 26mX80mm INNOVA STENT ON LEFT ILIAC AND 864m59mm, 4m56m9mm VBX AND 4mm32m5cm VIABAHN STENT ON RIGHT ILIAC;  Surgeon: BrabSerafina Mitchell;  [facility];  Laterality: Bilateral;   ORIF ANKLE FRACTURE Right    PROSTATE BIOPSY N/A 04/01/2021   Procedure: PROSTATE BIOPSY URONVernelle Emeraldurgeon: WolfRoyston Cowper;  Location: [facility];  Service: Urology;  Laterality: N/A;   VEIN HARVEST Bilateral 11/26/2021   Procedure: VEIN HARVEST OF BILATERAL SPAHENOUS VEINS;  Surgeon: BrabSerafina Mitchell;  [facility];  Service: Vascular;  Laterality: Bilateral;   WOUND DEBRIDEMENT Bilateral 12/22/2021   Procedure: INCISION AND DEBRIDEMENT OF BILATERAL GROINS;  Surgeon: BrabSerafina Mitchell;  [facility];  Service: Vascular;  Laterality: Bilateral;    No Known Allergies  Current Outpatient Medications  Medication Sig Dispense Refill   acetaminophen (TYLENOL) 325 MG tablet Take 2 tablets (650 mg total) by mouth every 6 (six) hours as needed for mild pain (or Fever >/= 101). 30 tablet 0   albuterol (PROVENTIL HFA;VENTOLIN HFA) 108 (90 Base) MCG/ACT inhaler Inhale 1-2 puffs into the lungs every 6 (six) hours as needed for wheezing or shortness of breath.     allopurinol (ZYLOPRIM) 300 MG tablet Take 300 mg by mouth every morning.     amiodarone (PACERONE) 200 MG tablet Take  1 tablet (200 mg total) by mouth 2 (two) times daily. 30 tablet 2   aspirin EC 81 MG tablet Take 81 mg by mouth every morning.     atenolol (TENORMIN) 25 MG tablet Take 25 mg by mouth every morning. (Patient not taking: Reported on 02/08/2022)     chlorthalidone (HYGROTON) 25 MG tablet Take 25 mg by mouth every morning.     clopidogrel (PLAVIX) 75 MG tablet Take 1 tablet (75 mg total) by mouth daily. 30 tablet 2   Docusate Calcium (STOOL SOFTENER PO) Take 1 capsule by mouth daily as needed (constipation).     ezetimibe (ZETIA) 10 MG tablet Take 1 tablet (10 mg total) by mouth daily. 90 tablet 3   fluticasone (FLONASE) 50 MCG/ACT nasal spray Place 2 sprays into both nostrils daily as needed for allergies or rhinitis.     lisinopril (ZESTRIL) 10 MG tablet Take 10 mg by mouth every morning.     naloxone (NARCAN) nasal spray 4 mg/0.1 mL SMARTSIG:Both Nares     oxyCODONE (OXY IR/ROXICODONE) 5 MG immediate release tablet Take 1  tablet (5 mg total) by mouth every 8 (eight) hours as needed for severe pain. (Patient not taking: Reported on 03/03/2022) 20 tablet 0   oxyCODONE-acetaminophen (PERCOCET) 5-325 MG tablet Take 1 tablet by mouth every 8 (eight) hours as needed for severe pain. (Patient not taking: Reported on 03/03/2022) 20 tablet 0   predniSONE (DELTASONE) 10 MG tablet Take 10-40 mg by mouth See admin instructions. Take as needed at the onset of a gout flare, take 40 mg on day 1, 30 mg on day 2, 20 mg on day 3, 10 mg on day 4, then stop     silver sulfADIAZINE (SILVADENE) 1 % cream Apply topically 2 (two) times daily. 85 g 0   simvastatin (ZOCOR) 20 MG tablet Take 20 mg by mouth every evening.     tamsulosin (FLOMAX) 0.4 MG CAPS capsule Take 0.4 mg by mouth daily after supper.     triamcinolone cream (KENALOG) 0.1 % Apply 1 application. topically daily as needed (irritation on foot).     VITAMIN D-1000 MAX ST 25 MCG (1000 UT) tablet Take 2,000 Units by mouth daily.     zolpidem (AMBIEN) 10 MG tablet  Take 10 mg by mouth at bedtime.     No current facility-administered medications for this visit.    Family History  Problem Relation Age of Onset   Heart disease Mother    Heart disease Brother     Social History   Socioeconomic History   Marital status: Married    Spouse name: Adelle   Number of children: 2   Years of education: Not on file   Highest education level: Not on file  Occupational History   Occupation: disabled  Tobacco Use   Smoking status: Former    Packs/day: 1.00    Years: 47.00    Total pack years: 47.00    Types: Cigarettes    Quit date: 2016    Years since quitting: 7.6    Passive exposure: Never   Smokeless tobacco: Never  Vaping Use   Vaping Use: Never used  Substance and Sexual Activity   Alcohol use: Not Currently   Drug use: No   Sexual activity: Not Currently  Other Topics Concern   Not on file  Social History Narrative   Not on file   Social Determinants of Health   Financial Resource Strain: Not on file  Food Insecurity: Not on file  Transportation Needs: Not on file  Physical Activity: Not on file  Stress: Not on file  Social Connections: Not on file  Intimate Partner Violence: Not on file     REVIEW OF SYSTEMS:   '[X]'$  denotes positive finding, '[ ]'$  denotes negative finding Cardiac  Comments:  Chest pain or chest pressure:    Shortness of breath upon exertion:    Short of breath when lying flat:    Irregular heart rhythm:        Vascular    Pain in calf, thigh, or hip brought on by ambulation:    Pain in feet at night that wakes you up from your sleep:     Blood clot in your veins:    Leg swelling:         Pulmonary    Oxygen at home:    Productive cough:     Wheezing:         Neurologic    Sudden weakness in arms or legs:     Sudden numbness in arms or legs:  Sudden onset of difficulty speaking or slurred speech:    Temporary loss of vision in one eye:     Problems with dizziness:         Gastrointestinal     Blood in stool:     Vomited blood:         Genitourinary    Burning when urinating:     Blood in urine:        Psychiatric    Major depression:         Hematologic    Bleeding problems:    Problems with blood clotting too easily:        Skin    Rashes or ulcers:        Constitutional    Fever or chills:      PHYSICAL EXAMINATION:  Today's Vitals   04/07/22 1303  BP: (!) 145/73  Pulse: 70  Resp: 20  Temp: 98.2 F (36.8 C)  TempSrc: Temporal  SpO2: 100%  Weight: 294 lb 4.8 oz (133.5 kg)  Height: '5\' 8"'$  (1.727 m)   Body mass index is 44.75 kg/m.   General:  WDWN in NAD; vital signs documented above Gait: Not observed HENT: WNL, normocephalic Pulmonary: normal non-labored breathing , without wheezing Cardiac: regular HR, without carotid bruits Abdomen: soft, obese Skin: without rashes Vascular Exam/Pulses: Bilateral groin wounds completely healed Brisk doppler flow bilateral DP/PT Extremities: without ischemic changes, without Gangrene , without cellulitis; without open wounds Musculoskeletal: no muscle wasting or atrophy  Neurologic: A&O X 3 Psychiatric:  The pt has Normal affect.   Non-Invasive Vascular Imaging:   ABI's/TBI's on 04/07/2022: Right:  1.31/0.57 - Great toe pressure: 86 Left:  1.09/0.62 - Great toe pressure: 94   Previous ABI's/TBI's on 09/23/2021: Right:  0.62/0.25 - Great toe pressure: 35 Left:  0.64/0.50 - Great toe pressure:  70  Previous arterial duplex on 09/23/2021: +----------+--------+-----+--------+----------+------------------------+  RIGHT     PSV cm/sRatioStenosisWaveform  Comments                  +----------+--------+-----+--------+----------+------------------------+  CFA Mid                                  Occluded ?                +----------+--------+-----+--------+----------+------------------------+  DFA       122                  monophasic                           +----------+--------+-----+--------+----------+------------------------+  SFA Prox  54                   monophasicscant flow by color flow  +----------+--------+-----+--------+----------+------------------------+  SFA Mid   25                   monophasic                          +----------+--------+-----+--------+----------+------------------------+  SFA Distal24                   monophasic                          +----------+--------+-----+--------+----------+------------------------+  POP Prox  24  monophasic                          +----------+--------+-----+--------+----------+------------------------+  POP Mid   38                   monophasic                          +----------+--------+-----+--------+----------+------------------------+  POP Distal33                   monophasic                          +----------+--------+-----+--------+----------+------------------------+  TP Trunk  31                   monophasic                          +----------+--------+-----+--------+----------+------------------------+  PTA Mid   27                   monophasic                          +----------+--------+-----+--------+----------+------------------------+  PERO Mid  14                   monophasic                          +----------+--------+-----+--------+----------+------------------------+   The entire length of the CFA is heavilty plaqued so imaging was extremely  limited. Occlusion could not be ruled-out.    +----------+--------+-----+---------------+----------+----------+  LEFT      PSV cm/sRatioStenosis       Waveform  Comments    +----------+--------+-----+---------------+----------+----------+  EIA [id number]                                               +----------+--------+-----+---------------+----------+----------+  CFA Mid   514          75-99% stenosismonophasic             +----------+--------+-----+---------------+----------+----------+  DFA       43                          monophasicscant flow  +----------+--------+-----+---------------+----------+----------+  SFA Prox  261          50-74% stenosismonophasic            +----------+--------+-----+---------------+----------+----------+  SFA Mid   36                          monophasic            +----------+--------+-----+---------------+----------+----------+  SFA Distal70                          monophasic            +----------+--------+-----+---------------+----------+----------+  POP Prox  43                          monophasic            +----------+--------+-----+---------------+----------+----------+  POP Mid   34                          monophasic            +----------+--------+-----+---------------+----------+----------+  POP Distal35                          monophasic            +----------+--------+-----+---------------+----------+----------+  TP Trunk  38                          monophasic            +----------+--------+-----+---------------+----------+----------+  ATA Mid   24                          monophasic            +----------+--------+-----+---------------+----------+----------+  PTA Mid                occluded                             +----------+--------+-----+---------------+----------+----------+  PERO Mid  23                          monophasic            +----------+--------+-----+---------------+----------+----------+   ASSESSMENT/PLAN:: 66 y.o. male here for follow up for PAD with hx of  #1: Right iliofemoral endarterectomy with vein patch angioplasty #2: Left iliofemoral endarterectomy with vein patch angioplasty #3: Stent, left common iliac artery #4: Stent, left external iliac artery #5: Intra-arterial lithotripsy: Left common and external iliac arteries #6: Intra-arterial  lithotripsy: Right common and external iliac arteries #7: Stent, right external iliac artery #8: Stent, right common femoral artery On 11/26/2021 by Dr. Trula Slade  He was discharged on 12/06/2021.  He was admitted a week latera with bilateral post surgical groin wounds and was taken to the OR on 12/14/2021 and underwent bilateral groin debridement with antibiotic bead placement and vac placement on 12/14/2021 by Dr. Virl Cagey.  He was taken back to the OR on 12/16/2021 and underwent I&D of bilateral groin wounds with vac placement by Dr. Trula Slade.   He again was taken back to the OR on 12/22/2021 for I&D of bilateral groin wounds and application of xenograft and vac placement by Dr. Trula Slade.    -pt's groin wounds have completely healed.  -his preoperative lifestyle limiting claudication has resolved and ABI today have significantly improved.   -continue statin/asa/plavix -pt will f/u in 3 months with ABI, aortoiliac duplex (bilateral)   [name], [facility] [phone]  Clinic MD:   Scot Dock

## 2022-04-07 ENCOUNTER — Ambulatory Visit (INDEPENDENT_AMBULATORY_CARE_PROVIDER_SITE_OTHER): Payer: Medicare PPO | Admitting: Physician Assistant

## 2022-04-07 ENCOUNTER — Ambulatory Visit (HOSPITAL_COMMUNITY)
Admission: RE | Admit: 2022-04-07 | Discharge: 2022-04-07 | Disposition: A | Discharge status: Home / Self Care | Payer: Medicare PPO | Source: Ambulatory Visit | Attending: Vascular Surgery | Admitting: Vascular Surgery

## 2022-04-07 ENCOUNTER — Other Ambulatory Visit: Payer: Self-pay

## 2022-04-07 VITALS — BP 145/73 | HR 70 | Temp 98.2°F | Resp 20 | Ht 68.0 in | Wt 294.3 lb

## 2022-04-07 DIAGNOSIS — T8189XS Other complications of procedures, not elsewhere classified, sequela: Secondary | ICD-10-CM | POA: Diagnosis not present

## 2022-04-07 DIAGNOSIS — I739 Peripheral vascular disease, unspecified: Secondary | ICD-10-CM

## 2022-07-11 ENCOUNTER — Ambulatory Visit (INDEPENDENT_AMBULATORY_CARE_PROVIDER_SITE_OTHER)
Admission: RE | Admit: 2022-07-11 | Discharge: 2022-07-11 | Disposition: A | Discharge status: Home / Self Care | Payer: Medicare PPO | Source: Ambulatory Visit | Attending: Surgery | Admitting: Surgery

## 2022-07-11 ENCOUNTER — Ambulatory Visit: Payer: Medicare PPO | Admitting: Physician Assistant

## 2022-07-11 ENCOUNTER — Ambulatory Visit (HOSPITAL_COMMUNITY)
Admission: RE | Admit: 2022-07-11 | Discharge: 2022-07-11 | Disposition: A | Discharge status: Home / Self Care | Payer: Medicare PPO | Source: Ambulatory Visit | Attending: Surgery | Admitting: Surgery

## 2022-07-11 VITALS — BP 134/78 | HR 67 | Temp 98.3°F | Resp 16 | Ht 70.0 in | Wt 285.0 lb

## 2022-07-11 DIAGNOSIS — I739 Peripheral vascular disease, unspecified: Secondary | ICD-10-CM | POA: Insufficient documentation

## 2022-07-11 DIAGNOSIS — T8189XS Other complications of procedures, not elsewhere classified, sequela: Secondary | ICD-10-CM | POA: Diagnosis not present

## 2022-07-11 NOTE — Progress Notes (Signed)
Office Note     CC:  follow up Requesting Provider:  Rutherford Limerick, PA  HPI: Kevin Gross is a 66 y.o. (1955-12-28) male who presents for routine follow up of PAD. He is s/p #1: Right iliofemoral endarterectomy with vein patch angioplasty #2: Left iliofemoral endarterectomy with vein patch angioplasty #3: Stent, left common iliac artery #4: Stent, left external iliac artery #5: Intra-arterial lithotripsy: Left common and external iliac arteries #6: Intra-arterial lithotripsy: Right common and external iliac arteries #7: Stent, right external iliac artery #8: Stent, right common femoral artery on 11/26/2021 by Dr. Trula Slade. Following this he was readmitted with bilateral post operative groin wounds requiring debridement on 4/18, 4/20, and 12/22/21. He was discharge with bilateral groin wound VACs. He had these until July of 2023 and subsequently was able to transition to wet to dry dressings. At the time of his last visit in August his wounds were completely healed. He was without any lower extremity claudication, rest pain or tissue loss.  Today he reports no pain on ambulation, no rest pain, no tissue loss. His wife has severe COPD so he reports that he has to do everything around his house and also all the grocery shopping etc so this keeps him active. He is compliant with his Aspirin, statin and Plavix.   The pt is on a statin for cholesterol management.  The pt is on a daily aspirin.   Other AC:  Plavix The pt is on BB, ACEI for hypertension.   The pt is not diabetic.   Tobacco hx:  former  Past Medical History:  Diagnosis Date   Alcoholic cirrhosis of liver (HCC)    CHF (congestive heart failure) (HCC)    Clotting disorder (HCC)    Bilateral legs-pt states plaque build up in both legs requiring intervention   Complication of anesthesia    pt states that years ago during colonoscopy at [facility] they had to abort the procedure due to some "cardiac issue" had to see cardiologist    COPD (chronic obstructive pulmonary disease) ([place])    Hyperlipidemia    Hypertension    Pre-diabetes    Sleep apnea    uses cpap    Past Surgical History:  Procedure Laterality Date   ABDOMINAL AORTOGRAM W/LOWER EXTREMITY N/A 10/20/2021   Procedure: ABDOMINAL AORTOGRAM W/LOWER EXTREMITY;  Surgeon: Wellington Hampshire, MD;  Location: [place] [facility];  Service: Cardiovascular;  Laterality: N/A;   ALLOGRAFT APPLICATION Bilateral 04/26/9370   Procedure: ALLOGRAFT APPLICATION;  Surgeon: Serafina Mitchell, MD;  Location: [place];  Service: Vascular;  Laterality: Bilateral;   ANGIOPLASTY Left 11/26/2021   Procedure: LEFT INFRARENAL LITHOTRIPSY;  Surgeon: Serafina Mitchell, MD;  Location: [place];  Service: Vascular;  Laterality: Left;   APPLICATION OF WOUND VAC Bilateral 12/13/2021   Procedure: APPLICATION OF WOUND VAC;  Surgeon: Broadus John, MD;  Location: [place];  Service: Vascular;  Laterality: Bilateral;   [place]   COLONOSCOPY     COLONOSCOPY WITH PROPOFOL N/A 01/17/2018   Procedure: COLONOSCOPY WITH PROPOFOL;  Surgeon: Toledo, Benay Pike, MD;  Location: [facility];  Service: Gastroenterology;  Laterality: N/A;   ENDARTERECTOMY FEMORAL Bilateral 11/26/2021   Procedure: BILATERAL FEMORAL ENDARTERECTOMY WITH VEIN PATCH ANGIOPLASTY;  Surgeon: Serafina Mitchell, MD;  [facility];  Service: Vascular;  Laterality: Bilateral;   ESOPHAGOGASTRODUODENOSCOPY (EGD) WITH PROPOFOL N/A 01/17/2018   Procedure: ESOPHAGOGASTRODUODENOSCOPY (EGD) WITH PROPOFOL;  Surgeon: Alice Reichert, Benay Pike, MD;  Location: [facility];  Service: Gastroenterology;  Laterality: N/A;   GROIN DEBRIDEMENT Bilateral 12/16/2021   Procedure: IRRIGATION AND DEBRIDEMENT OF BILATERAL GROINS;  Surgeon: Serafina Mitchell, MD;  Location: [place];  Service: Vascular;  Laterality: Bilateral;   INCISION AND DRAINAGE OF WOUND Bilateral 12/13/2021   Procedure: IRRIGATION AND DEBRIDEMENT OF BILATERAL GROIN WOUNDS;  Surgeon:  Broadus John, MD;  Location: [place];  Service: Vascular;  Laterality: Bilateral;   INSERTION OF ILIAC STENT Bilateral 11/26/2021   Procedure: BILATERAL ILIAC STENTING USING 41mX80mm INNOVA STENT ON LEFT ILIAC AND 835m59mm, 36m53m9mm VBX AND 36mm5m5cm VIABAHN STENT ON RIGHT ILIAC;  Surgeon: BrabSerafina Mitchell;  [place] [facility] [place];  Laterality: Bilateral;   ORIF ANKLE FRACTURE Right    PROSTATE BIOPSY N/A 04/01/2021   Procedure: PROSTATE BIOPSY URONVernelle Emeraldurgeon: WolfRoyston Cowper;  Location: [facility];  Service: Urology;  Laterality: N/A;   VEIN HARVEST Bilateral 11/26/2021   Procedure: VEIN HARVEST OF BILATERAL SPAHENOUS VEINS;  Surgeon: BrabSerafina Mitchell;  [facility];  Service: Vascular;  Laterality: Bilateral;   WOUND DEBRIDEMENT Bilateral 12/22/2021   Procedure: INCISION AND DEBRIDEMENT OF BILATERAL GROINS;  Surgeon: BrabSerafina Mitchell;  [facility];  Service: Vascular;  Laterality: Bilateral;    Social History   Socioeconomic History   Marital status: Married    Spouse name: Kevin Gross   Number of children: 2   Years of education: Not on file   Highest education level: Not on file  Occupational History   Occupation: disabled  Tobacco Use   Smoking status: Former    Packs/day: 1.00    Years: 47.00    Total pack years: 47.00    Types: Cigarettes    Quit date: 2016    Years since quitting: 7.8    Passive exposure: Never   Smokeless tobacco: Never  Vaping Use   Vaping Use: Never used  Substance and Sexual Activity   Alcohol use: Not Currently   Drug use: No   Sexual activity: Not Currently  Other Topics Concern   Not on file  Social History Narrative   Not on file   Social Determinants of Health   Financial Resource Strain: Not on file  Food Insecurity: Not on file  Transportation Needs: Not on file  Physical Activity: Not on file  Stress: Not on file  Social Connections: Not on file  Intimate Partner Violence: Not on file    Family  History  Problem Relation Age of Onset   Heart disease Mother    Heart disease Brother     Current Outpatient Medications  Medication Sig Dispense Refill   albuterol (PROVENTIL HFA;VENTOLIN HFA) 108 (90 Base) MCG/ACT inhaler Inhale 1-2 puffs into the lungs every 6 (six) hours as needed for wheezing or shortness of breath.     allopurinol (ZYLOPRIM) 300 MG tablet Take 300 mg by mouth every morning.     amiodarone (PACERONE) 200 MG tablet Take 1 tablet (200 mg total) by mouth 2 (two) times daily. 30 tablet 2   aspirin EC 81 MG tablet Take 81 mg by mouth every morning.     chlorthalidone (HYGROTON) 25 MG tablet Take 25 mg by mouth every morning.     clopidogrel (PLAVIX) 75 MG tablet Take 1 tablet (75 mg total) by mouth daily. 30 tablet 2   Docusate Calcium (STOOL SOFTENER PO) Take 1 capsule by mouth daily as needed (constipation).     ezetimibe (ZETIA)  10 MG tablet Take 1 tablet (10 mg total) by mouth daily. 90 tablet 3   fluticasone (FLONASE) 50 MCG/ACT nasal spray Place 2 sprays into both nostrils daily as needed for allergies or rhinitis.     lisinopril (ZESTRIL) 10 MG tablet Take 10 mg by mouth every morning.     predniSONE (DELTASONE) 10 MG tablet Take 10-40 mg by mouth See admin instructions. Take as needed at the onset of a gout flare, take 40 mg on day 1, 30 mg on day 2, 20 mg on day 3, 10 mg on day 4, then stop     silver sulfADIAZINE (SILVADENE) 1 % cream Apply topically 2 (two) times daily. 85 g 0   simvastatin (ZOCOR) 20 MG tablet Take 20 mg by mouth every evening.     tamsulosin (FLOMAX) 0.4 MG CAPS capsule Take 0.4 mg by mouth daily after supper.     triamcinolone cream (KENALOG) 0.1 % Apply 1 application. topically daily as needed (irritation on foot).     VITAMIN D-1000 MAX ST 25 MCG (1000 UT) tablet Take 2,000 Units by mouth daily.     zolpidem (AMBIEN) 10 MG tablet Take 10 mg by mouth at bedtime.     acetaminophen (TYLENOL) 325 MG tablet Take 2 tablets (650 mg total) by mouth  every 6 (six) hours as needed for mild pain (or Fever >/= 101). (Patient not taking: Reported on 07/11/2022) 30 tablet 0   atenolol (TENORMIN) 25 MG tablet Take 25 mg by mouth every morning. (Patient not taking: Reported on 07/11/2022)     naloxone [facility]) nasal spray 4 mg/0.1 mL SMARTSIG:Both Nares (Patient not taking: Reported on 07/11/2022)     oxyCODONE (OXY IR/ROXICODONE) 5 MG immediate release tablet Take 1 tablet (5 mg total) by mouth every 8 (eight) hours as needed for severe pain. (Patient not taking: Reported on 07/11/2022) 20 tablet 0   oxyCODONE-acetaminophen (PERCOCET) 5-325 MG tablet Take 1 tablet by mouth every 8 (eight) hours as needed for severe pain. (Patient not taking: Reported on 07/11/2022) 20 tablet 0   No current facility-administered medications for this visit.    No Known Allergies   REVIEW OF SYSTEMS:  '[X]'$  denotes positive finding, '[ ]'$  denotes negative finding Cardiac  Comments:  Chest pain or chest pressure:    Shortness of breath upon exertion:    Short of breath when lying flat:    Irregular heart rhythm:        Vascular    Pain in calf, thigh, or hip brought on by ambulation:    Pain in feet at night that wakes you up from your sleep:     Blood clot in your veins:    Leg swelling:         Pulmonary    Oxygen at home:    Productive cough:     Wheezing:         Neurologic    Sudden weakness in arms or legs:     Sudden numbness in arms or legs:     Sudden onset of difficulty speaking or slurred speech:    Temporary loss of vision in one eye:     Problems with dizziness:         Gastrointestinal    Blood in stool:     Vomited blood:         Genitourinary    Burning when urinating:     Blood in urine:        Psychiatric  Major depression:         Hematologic    Bleeding problems:    Problems with blood clotting too easily:        Skin    Rashes or ulcers:        Constitutional    Fever or chills:      PHYSICAL  EXAMINATION:  Vitals:   07/11/22 1058  BP: 134/78  Pulse: 67  Resp: 16  Temp: 98.3 F (36.8 C)  TempSrc: Temporal  SpO2: 100%  Weight: 285 lb (129.3 kg)  Height: '5\' 10"'$  (1.778 m)    General:  WDWN in NAD; vital signs documented above Gait: Normal HENT: WNL, normocephalic Pulmonary: normal non-labored breathing Cardiac: regular HR, without  Murmurs , without carotid bruits Abdomen: soft, NT, no masses Vascular Exam/Pulses:  Right Left  Radial 2+ (normal) 2+ (normal)  Femoral 2+ (normal) 2+ (normal)  Popliteal Not palpable Not palpable  DP 1+ (weak) Not palpable  PT 1+ (weak) 1+ (weak)   Extremities: without ischemic changes, without Gangrene , without cellulitis; without open wounds; bilateral groin incisions well healed. Feet warm and well perfused Musculoskeletal: no muscle wasting or atrophy  Neurologic: A&O X 3;  No focal weakness or paresthesias are detected Psychiatric:  The pt has Normal affect.   Non-Invasive Vascular Imaging:   VAS [place] Aorto/IVC/Iliacs: Summary:  Stenosis: Patent bilateral stents. Right stents demonstrate elevated velocity however no narrowing noted. No stenosis seen in the left stents.   +-------+-----------+-----------+------------+------------+  ABI/TBIToday's ABIToday's TBIPrevious ABIPrevious TBI  +-------+-----------+-----------+------------+------------+  Right 1.12       0.48       0.62        0.25          +-------+-----------+-----------+------------+------------+  Left  0.93       0.44       0.64        0.5           +-------+-----------+-----------+------------+------------+    ASSESSMENT/PLAN:: 66 y.o. male here for follow up for PAD. He reports no claudication, rest pain, or tissue loss. His ABI's are improved bilaterally. Duplex shows patent bilateral stents.  - Encouraged him to continue to keep his activity up/ walking regimen - Continue Aspirin, statin, Plavix - He will follow up in 4-5 months with ABI and  Aorto/IVC/iliac artery duplex  Karoline Caldwell, PA-C [facility] [phone]  Clinic MD:   Trula Slade

## 2022-07-15 ENCOUNTER — Other Ambulatory Visit: Payer: Self-pay

## 2022-07-15 DIAGNOSIS — I739 Peripheral vascular disease, unspecified: Secondary | ICD-10-CM

## 2022-07-15 DIAGNOSIS — I70213 Atherosclerosis of native arteries of extremities with intermittent claudication, bilateral legs: Secondary | ICD-10-CM

## 2022-09-09 ENCOUNTER — Emergency Department: Payer: Medicare PPO

## 2022-09-09 ENCOUNTER — Encounter: Payer: Self-pay | Admitting: Internal Medicine

## 2022-09-09 ENCOUNTER — Other Ambulatory Visit: Payer: Self-pay

## 2022-09-09 ENCOUNTER — Inpatient Hospital Stay
Admission: EM | Admit: 2022-09-09 | Discharge: 2022-09-13 | DRG: 812 | Disposition: A | Discharge status: Home / Self Care | Payer: Medicare PPO | Attending: Student | Admitting: Student

## 2022-09-09 DIAGNOSIS — K7030 Alcoholic cirrhosis of liver without ascites: Secondary | ICD-10-CM | POA: Diagnosis present

## 2022-09-09 DIAGNOSIS — Z79899 Other long term (current) drug therapy: Secondary | ICD-10-CM

## 2022-09-09 DIAGNOSIS — N179 Acute kidney failure, unspecified: Secondary | ICD-10-CM | POA: Diagnosis present

## 2022-09-09 DIAGNOSIS — E785 Hyperlipidemia, unspecified: Secondary | ICD-10-CM | POA: Diagnosis present

## 2022-09-09 DIAGNOSIS — Z6839 Body mass index (BMI) 39.0-39.9, adult: Secondary | ICD-10-CM

## 2022-09-09 DIAGNOSIS — Z95820 Peripheral vascular angioplasty status with implants and grafts: Secondary | ICD-10-CM

## 2022-09-09 DIAGNOSIS — Z86010 Personal history of colonic polyps: Secondary | ICD-10-CM

## 2022-09-09 DIAGNOSIS — D689 Coagulation defect, unspecified: Secondary | ICD-10-CM | POA: Diagnosis present

## 2022-09-09 DIAGNOSIS — N189 Chronic kidney disease, unspecified: Secondary | ICD-10-CM | POA: Diagnosis not present

## 2022-09-09 DIAGNOSIS — N1832 Chronic kidney disease, stage 3b: Secondary | ICD-10-CM | POA: Diagnosis present

## 2022-09-09 DIAGNOSIS — I129 Hypertensive chronic kidney disease with stage 1 through stage 4 chronic kidney disease, or unspecified chronic kidney disease: Secondary | ICD-10-CM | POA: Diagnosis present

## 2022-09-09 DIAGNOSIS — I10 Essential (primary) hypertension: Secondary | ICD-10-CM | POA: Diagnosis present

## 2022-09-09 DIAGNOSIS — I480 Paroxysmal atrial fibrillation: Secondary | ICD-10-CM | POA: Diagnosis present

## 2022-09-09 DIAGNOSIS — K3189 Other diseases of stomach and duodenum: Secondary | ICD-10-CM | POA: Diagnosis present

## 2022-09-09 DIAGNOSIS — D509 Iron deficiency anemia, unspecified: Secondary | ICD-10-CM | POA: Diagnosis present

## 2022-09-09 DIAGNOSIS — E669 Obesity, unspecified: Secondary | ICD-10-CM | POA: Diagnosis present

## 2022-09-09 DIAGNOSIS — I2510 Atherosclerotic heart disease of native coronary artery without angina pectoris: Secondary | ICD-10-CM | POA: Diagnosis present

## 2022-09-09 DIAGNOSIS — Z8249 Family history of ischemic heart disease and other diseases of the circulatory system: Secondary | ICD-10-CM | POA: Diagnosis not present

## 2022-09-09 DIAGNOSIS — Z87891 Personal history of nicotine dependence: Secondary | ICD-10-CM | POA: Diagnosis not present

## 2022-09-09 DIAGNOSIS — K640 First degree hemorrhoids: Secondary | ICD-10-CM | POA: Diagnosis present

## 2022-09-09 DIAGNOSIS — D649 Anemia, unspecified: Secondary | ICD-10-CM | POA: Diagnosis present

## 2022-09-09 DIAGNOSIS — J432 Centrilobular emphysema: Secondary | ICD-10-CM | POA: Diagnosis not present

## 2022-09-09 DIAGNOSIS — D631 Anemia in chronic kidney disease: Secondary | ICD-10-CM | POA: Diagnosis present

## 2022-09-09 DIAGNOSIS — I739 Peripheral vascular disease, unspecified: Secondary | ICD-10-CM | POA: Diagnosis present

## 2022-09-09 DIAGNOSIS — G4730 Sleep apnea, unspecified: Secondary | ICD-10-CM | POA: Diagnosis present

## 2022-09-09 DIAGNOSIS — J449 Chronic obstructive pulmonary disease, unspecified: Secondary | ICD-10-CM | POA: Diagnosis present

## 2022-09-09 DIAGNOSIS — Z7982 Long term (current) use of aspirin: Secondary | ICD-10-CM

## 2022-09-09 DIAGNOSIS — Z7902 Long term (current) use of antithrombotics/antiplatelets: Secondary | ICD-10-CM

## 2022-09-09 DIAGNOSIS — R7303 Prediabetes: Secondary | ICD-10-CM | POA: Diagnosis present

## 2022-09-09 DIAGNOSIS — K5730 Diverticulosis of large intestine without perforation or abscess without bleeding: Secondary | ICD-10-CM | POA: Diagnosis present

## 2022-09-09 LAB — BASIC METABOLIC PANEL
Anion gap: 10 (ref 5–15)
BUN: 36 mg/dL — ABNORMAL HIGH (ref 8–23)
CO2: 23 mmol/L (ref 22–32)
Calcium: 9 mg/dL (ref 8.9–10.3)
Chloride: 108 mmol/L (ref 98–111)
Creatinine, Ser: 2.25 mg/dL — ABNORMAL HIGH (ref 0.61–1.24)
GFR, Estimated: 32 mL/min — ABNORMAL LOW (ref >60)
Glucose, Bld: 115 mg/dL — ABNORMAL HIGH (ref 70–99)
Potassium: 4.6 mmol/L (ref 3.5–5.1)
Sodium: 141 mmol/L (ref 135–145)

## 2022-09-09 LAB — HEPATIC FUNCTION PANEL
ALT: 14 U/L (ref 0–44)
AST: 17 U/L (ref 15–41)
Albumin: 3.8 g/dL (ref 3.5–5.0)
Alkaline Phosphatase: 46 U/L (ref 38–126)
Bilirubin, Direct: <0.1 mg/dL (ref 0.0–0.2)
Total Bilirubin: 0.5 mg/dL (ref 0.3–1.2)
Total Protein: 7 g/dL (ref 6.5–8.1)

## 2022-09-09 LAB — IRON AND TIBC
Iron: 19 ug/dL — ABNORMAL LOW (ref 45–182)
Saturation Ratios: 4 % — ABNORMAL LOW (ref 17.9–39.5)
TIBC: 461 ug/dL — ABNORMAL HIGH (ref 250–450)
UIBC: 442 ug/dL

## 2022-09-09 LAB — FOLATE: Folate: 15.3 ng/mL (ref >5.9)

## 2022-09-09 LAB — CBC
HCT: 22 % — ABNORMAL LOW (ref 39.0–52.0)
Hemoglobin: 5.7 g/dL — ABNORMAL LOW (ref 13.0–17.0)
MCH: 20.9 pg — ABNORMAL LOW (ref 26.0–34.0)
MCHC: 25.9 g/dL — ABNORMAL LOW (ref 30.0–36.0)
MCV: 80.6 fL (ref 80.0–100.0)
Platelets: 283 10*3/uL (ref 150–400)
RBC: 2.73 MIL/uL — ABNORMAL LOW (ref 4.22–5.81)
RDW: 21.6 % — ABNORMAL HIGH (ref 11.5–15.5)
WBC: 6.1 10*3/uL (ref 4.0–10.5)
nRBC: 0 % (ref 0.0–0.2)

## 2022-09-09 LAB — HEMOGLOBIN AND HEMATOCRIT, BLOOD
HCT: 26.5 % — ABNORMAL LOW (ref 39.0–52.0)
Hemoglobin: 7.3 g/dL — ABNORMAL LOW (ref 13.0–17.0)

## 2022-09-09 LAB — URINALYSIS, ROUTINE W REFLEX MICROSCOPIC
Bilirubin Urine: NEGATIVE
Glucose, UA: NEGATIVE mg/dL
Hgb urine dipstick: NEGATIVE
Ketones, ur: NEGATIVE mg/dL
Leukocytes,Ua: NEGATIVE
Nitrite: NEGATIVE
Protein, ur: NEGATIVE mg/dL
Specific Gravity, Urine: 1.014 (ref 1.005–1.030)
pH: 5 (ref 5.0–8.0)

## 2022-09-09 LAB — CBG MONITORING, ED: Glucose-Capillary: 102 mg/dL — ABNORMAL HIGH (ref 70–99)

## 2022-09-09 LAB — RETICULOCYTES
Immature Retic Fract: 19.1 % — ABNORMAL HIGH (ref 2.3–15.9)
RBC.: 2.73 MIL/uL — ABNORMAL LOW (ref 4.22–5.81)
Retic Count, Absolute: 58.1 10*3/uL (ref 19.0–186.0)
Retic Ct Pct: 2.1 % (ref 0.4–3.1)

## 2022-09-09 LAB — PREPARE RBC (CROSSMATCH)

## 2022-09-09 LAB — FERRITIN: Ferritin: 4 ng/mL — ABNORMAL LOW (ref 24–336)

## 2022-09-09 LAB — TSH: TSH: 1.041 u[IU]/mL (ref 0.350–4.500)

## 2022-09-09 MED ORDER — ALBUTEROL SULFATE (2.5 MG/3ML) 0.083% IN NEBU: 2.5000 mg | INHALATION_SOLUTION | Freq: Four times a day (QID) | RESPIRATORY_TRACT | Status: DC | PRN

## 2022-09-09 MED ORDER — SENNOSIDES-DOCUSATE SODIUM 8.6-50 MG PO TABS: 1.0000 | ORAL_TABLET | Freq: Every evening | ORAL | Status: DC | PRN

## 2022-09-09 MED ORDER — SIMVASTATIN 20 MG PO TABS
20.0000 mg | ORAL_TABLET | Freq: Every evening | ORAL | Status: DC
  Administered 2022-09-09 – 2022-09-11 (×3): 20 mg via ORAL
  Filled 2022-09-09: qty 2
  Filled 2022-09-09 (×2): qty 1

## 2022-09-09 MED ORDER — ZOLPIDEM TARTRATE 5 MG PO TABS
5.0000 mg | ORAL_TABLET | Freq: Every evening | ORAL | Status: DC | PRN
  Administered 2022-09-10 – 2022-09-12 (×3): 5 mg via ORAL
  Filled 2022-09-09 (×3): qty 1

## 2022-09-09 MED ORDER — DOCUSATE SODIUM 100 MG PO CAPS: 100.0000 mg | ORAL_CAPSULE | Freq: Every day | ORAL | Status: DC | PRN

## 2022-09-09 MED ORDER — TAMSULOSIN HCL 0.4 MG PO CAPS
0.4000 mg | ORAL_CAPSULE | Freq: Every day | ORAL | Status: DC
  Administered 2022-09-10 – 2022-09-11 (×2): 0.4 mg via ORAL
  Filled 2022-09-09 (×2): qty 1

## 2022-09-09 MED ORDER — ONDANSETRON HCL 4 MG PO TABS: 4.0000 mg | ORAL_TABLET | Freq: Four times a day (QID) | ORAL | Status: DC | PRN

## 2022-09-09 MED ORDER — ALLOPURINOL 100 MG PO TABS
300.0000 mg | ORAL_TABLET | ORAL | Status: DC
  Administered 2022-09-10 – 2022-09-13 (×3): 300 mg via ORAL
  Filled 2022-09-09: qty 3
  Filled 2022-09-09: qty 1
  Filled 2022-09-09: qty 3

## 2022-09-09 MED ORDER — LABETALOL HCL 5 MG/ML IV SOLN: 5.0000 mg | INTRAVENOUS | Status: DC | PRN

## 2022-09-09 MED ORDER — SODIUM CHLORIDE 0.9% IV SOLUTION: Freq: Once | INTRAVENOUS | Status: DC

## 2022-09-09 MED ORDER — ACETAMINOPHEN 650 MG RE SUPP: 650.0000 mg | Freq: Four times a day (QID) | RECTAL | Status: DC | PRN

## 2022-09-09 MED ORDER — ONDANSETRON HCL 4 MG/2ML IJ SOLN: 4.0000 mg | Freq: Four times a day (QID) | INTRAMUSCULAR | Status: DC | PRN

## 2022-09-09 MED ORDER — SODIUM CHLORIDE 0.9 % IV SOLN
10.0000 mL/h | Freq: Once | INTRAVENOUS | Status: AC
  Administered 2022-09-09: 10 mL/h via INTRAVENOUS

## 2022-09-09 MED ORDER — AMIODARONE HCL 200 MG PO TABS
200.0000 mg | ORAL_TABLET | Freq: Two times a day (BID) | ORAL | Status: DC
  Administered 2022-09-10 – 2022-09-13 (×7): 200 mg via ORAL
  Filled 2022-09-09 (×8): qty 1

## 2022-09-09 MED ORDER — ACETAMINOPHEN 325 MG PO TABS: 650.0000 mg | ORAL_TABLET | Freq: Four times a day (QID) | ORAL | Status: DC | PRN

## 2022-09-09 NOTE — Assessment & Plan Note (Signed)
-  Continue albuterol, 1 to 2 puff inhalation every 6 hours as needed for wheezing and shortness of breath

## 2022-09-09 NOTE — Hospital Course (Signed)
[name] is a 67 year old male with history of PAD status post bilateral endarterectomy with vein patch angioplasty, stent placement and left common iliac artery, left external iliac artery stent placement, history of alcoholic liver cirrhosis, heart failure, clotting disorder, hyperlipidemia, hypertension, sleep apnea, who presents emergency department for chief concerns of weakness.  Initial vitals in the emergency department showed temperature of 98, respiration rate of 20, heart rate of 66, blood pressure 119/56, SpO2 98% on room air.  Serum sodium is 143, potassium 4.6, chloride 108, bicarb 23, BUN of 36, serum creatinine of 2.25, EGFR 32, nonfasting blood glucose 115, WBC 6.1, hemoglobin 5.7, platelets 283.  UA was negative for leukocytes and nitrites.  ED treatment: Anemia panel ordered, type and screen  1/13: GI consult

## 2022-09-09 NOTE — Assessment & Plan Note (Addendum)
Severe acute on chronic anemia - Baseline hemoglobin is 7.3-8.3, based on values and April 2023 - Etiology workup in progress at this time, anemia panel ordered and in process - Differentials include chronic CKD versus iron deficiency as stool guaiac was negative per EDP - Nursing order to ensure with the patient has 2 peripheral IV, prefer large-bore - EDP ordered anemia panel which is pending collection at the time of this dictation - Type and screen completed in ED prior to admission - 2 units of PRBC ordered for transfusion - H/H timed recheck for 2100 on day of admission and CBC in the a.m. - Counseled patient extensively regarding follow-up with hematologist on discharge, patient endorses understanding and compliance - Goal hemoglobin greater than 8 - Admit to telemetry cardiac, inpatient

## 2022-09-09 NOTE — Assessment & Plan Note (Signed)
-  Patient baseline serum creatinine is 1.74-2.16/eGFR is 30-43.

## 2022-09-09 NOTE — ED Triage Notes (Signed)
Pt comes with c/o weakness for few weeks. Pt also states his hgb was 5.9 per PCP. Pt states he is on Plavix. Pt states no known black stool or evidence of blood.   Pt appears pale.

## 2022-09-09 NOTE — H&P (Signed)
History and Physical   Kevin Gross IRJ:[id number] DOB: [birth year] DOA: 09/09/2022  PCP: Rutherford Limerick, PA  Patient coming from: home via [place]  I have personally briefly reviewed patient's old medical records in [place].  Chief Concern: weakness  HPI: Kevin Gross is a 67 year old male with history of PAD status post bilateral endarterectomy with vein patch angioplasty, stent placement and left common iliac artery, left external iliac artery stent placement, history of alcoholic liver cirrhosis, heart failure, clotting disorder, hyperlipidemia, hypertension, sleep apnea, who presents emergency department for chief concerns of weakness.  Initial vitals in the emergency department showed temperature of 98, respiration rate of 20, heart rate of 66, blood pressure 119/56, SpO2 98% on room air.  Serum sodium is 143, potassium 4.6, chloride 108, bicarb 23, BUN of 36, serum creatinine of 2.25, EGFR 32, nonfasting blood glucose 115, WBC 6.1, hemoglobin 5.7, platelets 283.  UA was negative for leukocytes and nitrites.  ED treatment: Anemia panel ordered, type and screen --------------------------- At bedside, he is able to tell me his name, age, current year, and current location of hospital.   He reports he has been feeling weak for about two - three weeks. He states he may have been feeling weak longer, though he did not really notice. He reports the weakness is worse with exertion.  He reports he does eat red meat and last steak was 08/11/22.  He denies hematuria, melena/black stool, chest pain, nausea, vomiting, coffee ground emesis.   He reports he approximately 10-12 years ago, he was told he had increase iron in his blood and he required phlebotomy to decrease the iron levels. He saw a hematologist at that time and has not followed up because he did not have that issue again.  He denies recent weight changes.   Social history: he lives at home with his wife. He is a  former tobacco user, quiting about 7 years ago. He denies etoh and recreational drug use. He formerly worked as an Estate agent.  ROS: Constitutional: no weight change, no fever ENT/Mouth: no sore throat, no rhinorrhea Eyes: no eye pain, no vision changes Cardiovascular: no chest pain, + dyspnea,  no edema, no palpitations Respiratory: no cough, no sputum, no wheezing Gastrointestinal: no nausea, no vomiting, no diarrhea, no constipation Genitourinary: no urinary incontinence, no dysuria, no hematuria Musculoskeletal: no arthralgias, no myalgias Skin: no skin lesions, no pruritus, Neuro: + weakness, no loss of consciousness, no syncope Psych: no anxiety, no depression, no decrease appetite Heme/Lymph: no bruising, no bleeding  ED Course: Discussed with emergency medicine provider, patient requiring hospitalization for chief concerns of symptomatic anemia.  Assessment/Plan  Principal Problem:   Symptomatic anemia Active Problems:   Acute kidney injury superimposed on chronic kidney disease (HCC)   Essential hypertension   COPD (chronic obstructive pulmonary disease) (HCC)   PAD (peripheral artery disease) (HCC)   PAF (paroxysmal atrial fibrillation) (HCC)   CAD (coronary artery disease), native coronary artery   Stage 3b chronic kidney disease (CKD) (HCC)   Assessment and Plan:  * Symptomatic anemia Severe acute on chronic anemia - Baseline hemoglobin is 7.3-8.3, based on values and April 2023 - Etiology workup in progress at this time, anemia panel ordered and in process - Differentials include chronic CKD versus iron deficiency as stool guaiac was negative per EDP - Nursing order to ensure with the patient has 2 peripheral IV, prefer large-bore - EDP ordered anemia panel which is pending collection at the  time of this dictation - Type and screen completed in ED prior to admission - 2 units of PRBC ordered for transfusion - H/H timed recheck for 2100 on day  of admission and CBC in the a.m. - Counseled patient extensively regarding follow-up with hematologist on discharge, patient endorses understanding and compliance - Goal hemoglobin greater than 8 - Admit to telemetry cardiac, inpatient  Acute kidney injury superimposed on chronic kidney disease ([place]) - Presumed secondary to prerenal secondary to iron deficiency anemia - Treat per anemia with blood transfusion - Repeat BMP in the am  Essential hypertension - Lisinopril 10 mg, chlorthalidone 25 every morning, not resumed on admission due to acute kidney injury; AM team to resume when appropriate - Labetalol 5 mg IV every 3 hours as needed for SBP > 175, 4 doses ordered   COPD (chronic obstructive pulmonary disease) (HCC) - Albuterol, 1 to 2 puff inhalation every 6 hours as needed for wheezing and shortness of breath  PAD (peripheral artery disease) (HCC) - Plavix 75 mg daily, aspirin 81 mg daily has not been resumed on admission due to symptomatic anemia - AM team to resume when the benefits outweigh the risk  PAF (paroxysmal atrial fibrillation) (HCC) - Amiodarone 200 mg p.o. twice daily resumed  Stage 3b chronic kidney disease (CKD) ([place]) - Patient baseline serum creatinine is 1.74-2.16/eGFR is 30-43  CAD (coronary artery disease), native coronary artery - Aspirin 81 mg daily, clopidogrel 75 mg daily not resumed on admission - AM team to resume when appropriate  Med reconciliation, not completed on admission; medications resumed as appropriate  Chart reviewed.   DVT prophylaxis: TED hose, pharmacologic DVT prophylaxis not initiated on admission due to symptomatic acute on chronic anemia; AM team to initiate pharmacologic DVT prophylaxis when the benefits outweigh the risk Code Status: full code Diet: Heart healthy Family Communication: a phone call was offered, patient decline Disposition Plan: Pending clinical course Consults called: no Admission status: Telemetry cardiac,  inpatient  Past Medical History:  Diagnosis Date   Alcoholic cirrhosis of liver (HCC)    CHF (congestive heart failure) (HCC)    Clotting disorder (HCC)    Bilateral legs-pt states plaque build up in both legs requiring intervention   Complication of anesthesia    pt states that years ago during colonoscopy at [facility] they had to abort the procedure due to some "cardiac issue" had to see cardiologist   COPD (chronic obstructive pulmonary disease) ([place])    Hyperlipidemia    Hypertension    Pre-diabetes    Sleep apnea    uses cpap   Past Surgical History:  Procedure Laterality Date   ABDOMINAL AORTOGRAM W/LOWER EXTREMITY N/A 10/20/2021   Procedure: ABDOMINAL AORTOGRAM W/LOWER EXTREMITY;  Surgeon: Wellington Hampshire, MD;  Location: [place] [facility];  Service: Cardiovascular;  Laterality: N/A;   ALLOGRAFT APPLICATION Bilateral 01/13/6159   Procedure: ALLOGRAFT APPLICATION;  Surgeon: Serafina Mitchell, MD;  Location: [place];  Service: Vascular;  Laterality: Bilateral;   ANGIOPLASTY Left 11/26/2021   Procedure: LEFT INFRARENAL LITHOTRIPSY;  Surgeon: Serafina Mitchell, MD;  Location: [place];  Service: Vascular;  Laterality: Left;   APPLICATION OF WOUND VAC Bilateral 12/13/2021   Procedure: APPLICATION OF WOUND VAC;  Surgeon: Broadus John, MD;  Location: [place];  Service: Vascular;  Laterality: Bilateral;   [place]   COLONOSCOPY     COLONOSCOPY WITH PROPOFOL N/A 01/17/2018   Procedure: COLONOSCOPY WITH PROPOFOL;  Surgeon: [place],  Benay Pike, MD;  Location: [facility];  Service: Gastroenterology;  Laterality: N/A;   ENDARTERECTOMY FEMORAL Bilateral 11/26/2021   Procedure: BILATERAL FEMORAL ENDARTERECTOMY WITH VEIN PATCH ANGIOPLASTY;  Surgeon: Serafina Mitchell, MD;  [facility];  Service: Vascular;  Laterality: Bilateral;   ESOPHAGOGASTRODUODENOSCOPY (EGD) WITH PROPOFOL N/A 01/17/2018   Procedure: ESOPHAGOGASTRODUODENOSCOPY (EGD) WITH PROPOFOL;  Surgeon: [place],  Benay Pike, MD;  Location: [facility];  Service: Gastroenterology;  Laterality: N/A;   GROIN DEBRIDEMENT Bilateral 12/16/2021   Procedure: IRRIGATION AND DEBRIDEMENT OF BILATERAL GROINS;  Surgeon: Serafina Mitchell, MD;  Location: [place];  Service: Vascular;  Laterality: Bilateral;   INCISION AND DRAINAGE OF WOUND Bilateral 12/13/2021   Procedure: IRRIGATION AND DEBRIDEMENT OF BILATERAL GROIN WOUNDS;  Surgeon: Broadus John, MD;  Location: [place];  Service: Vascular;  Laterality: Bilateral;   INSERTION OF ILIAC STENT Bilateral 11/26/2021   Procedure: BILATERAL ILIAC STENTING USING 534mX80mm INNOVA STENT ON LEFT ILIAC AND 841m59mm, 34m31m9mm VBX AND 34mm55m5cm VIABAHN STENT ON RIGHT ILIAC;  Surgeon: BrabSerafina Mitchell;  [facility];  Laterality: Bilateral;   ORIF ANKLE FRACTURE Right    PROSTATE BIOPSY N/A 04/01/2021   Procedure: PROSTATE BIOPSY URONVernelle Emeraldurgeon: WolfRoyston Cowper;  Location: [facility];  Service: Urology;  Laterality: N/A;   VEIN HARVEST Bilateral 11/26/2021   Procedure: VEIN HARVEST OF BILATERAL SPAHENOUS VEINS;  Surgeon: BrabSerafina Mitchell;  [facility];  Service: Vascular;  Laterality: Bilateral;   WOUND DEBRIDEMENT Bilateral 12/22/2021   Procedure: INCISION AND DEBRIDEMENT OF BILATERAL GROINS;  Surgeon: BrabSerafina Mitchell;  [facility];  Service: Vascular;  Laterality: Bilateral;   Social History:  reports that he quit smoking about 8 years ago. His smoking use included cigarettes. He has a 47.00 pack-year smoking history. He has never been exposed to tobacco smoke. He has never used smokeless tobacco. He reports that he does not currently use alcohol. He reports that he does not use drugs.  No Known Allergies Family History  Problem Relation Age of Onset   Heart disease Mother    Heart disease Brother    Family history: Family history reviewed and not pertinent.  Prior to Admission medications   Medication Sig Start Date End Date Taking?  Authorizing Provider  acetaminophen (TYLENOL) 325 MG tablet Take 2 tablets (650 mg total) by mouth every 6 (six) hours as needed for mild pain (or Fever >/= 101). Patient not taking: Reported on 07/11/2022 02/22/19   VachVaughan Basta  albuterol (PROVENTIL HFA;VENTOLIN HFA) 108 (90 Base) MCG/ACT inhaler Inhale 1-2 puffs into the lungs every 6 (six) hours as needed for wheezing or shortness of breath.    [provider]  allopurinol (ZYLOPRIM) 300 MG tablet Take 300 mg by mouth every morning. 12/29/20   [provider]  amiodarone (PACERONE) 200 MG tablet Take 1 tablet (200 mg total) by mouth 2 (two) times daily. 12/06/21   Baglia, Corrina, PA-C  aspirin EC 81 MG tablet Take 81 mg by mouth every morning.    [provider]  atenolol (TENORMIN) 25 MG tablet Take 25 mg by mouth every morning. Patient not taking: Reported on 07/11/2022 10/13/20   [provider]  chlorthalidone (HYGROTON) 25 MG tablet Take 25 mg by mouth every morning. 12/29/20   [provider]  clopidogrel (PLAVIX) 75 MG tablet Take 1 tablet (75 mg total) by mouth daily. 12/06/21   Baglia, Corrina, PA-C  Docusate Calcium (STOOL  SOFTENER PO) Take 1 capsule by mouth daily as needed (constipation).    [provider]  ezetimibe (ZETIA) 10 MG tablet Take 1 tablet (10 mg total) by mouth daily. 04/10/18   Minna Merritts, MD  fluticasone (FLONASE) 50 MCG/ACT nasal spray Place 2 sprays into both nostrils daily as needed for allergies or rhinitis.    [provider]  lisinopril (ZESTRIL) 10 MG tablet Take 10 mg by mouth every morning. 12/29/20   [provider]  naloxone [facility]) nasal spray 4 mg/0.1 mL SMARTSIG:Both Nares Patient not taking: Reported on 07/11/2022 01/04/22   [provider]  oxyCODONE (OXY IR/ROXICODONE) 5 MG immediate release tablet Take 1 tablet (5 mg total) by mouth every 8 (eight) hours as needed for severe pain. Patient not taking: Reported  on 07/11/2022 01/03/22   Serafina Mitchell, MD  oxyCODONE-acetaminophen (PERCOCET) 5-325 MG tablet Take 1 tablet by mouth every 8 (eight) hours as needed for severe pain. Patient not taking: Reported on 07/11/2022 01/04/22 01/04/23  Gabriel Earing, PA-C  predniSONE (DELTASONE) 10 MG tablet Take 10-40 mg by mouth See admin instructions. Take as needed at the onset of a gout flare, take 40 mg on day 1, 30 mg on day 2, 20 mg on day 3, 10 mg on day 4, then stop    [provider]  silver sulfADIAZINE (SILVADENE) 1 % cream Apply topically 2 (two) times daily. 12/06/21   Baglia, Corrina, PA-C  simvastatin (ZOCOR) 20 MG tablet Take 20 mg by mouth every evening. 12/29/20   [provider]  tamsulosin (FLOMAX) 0.4 MG CAPS capsule Take 0.4 mg by mouth daily after supper.    [provider]  triamcinolone cream (KENALOG) 0.1 % Apply 1 application. topically daily as needed (irritation on foot).    [provider]  VITAMIN D-1000 MAX ST 25 MCG (1000 UT) tablet Take 2,000 Units by mouth daily. 12/29/20   [provider]  zolpidem (AMBIEN) 10 MG tablet Take 10 mg by mouth at bedtime. 12/29/20   [provider]   Physical Exam: Vitals:   09/09/22 1630 09/09/22 1636 09/09/22 1645 09/09/22 1700  BP: (!) 112/57 114/60 118/69 (!) 109/55  Pulse: (!) 52 (!) 53 (!) 50 70  Resp: '15 19 13 17  '$ Temp:      TempSrc:      SpO2: 100% 100% 100% 100%  Weight:      Height:       Constitutional: appears age appropriate, NAD, calm, comfortable Eyes: PERRL, lids and conjunctivae normal ENMT: Mucous membranes are moist. Posterior pharynx clear of any exudate or lesions. Age-appropriate dentition. Hearing appropriate Neck: normal, supple, no masses, no thyromegaly Respiratory: clear to auscultation bilaterally, no wheezing, no crackles. Normal respiratory effort. No accessory muscle use.  Cardiovascular: Regular rate and rhythm, no murmurs / rubs / gallops. No extremity edema. 2+  pedal pulses. No carotid bruits.  Abdomen: morbidly obese abdomen, no tenderness, no masses palpated, no hepatosplenomegaly. Bowel sounds positive.  Musculoskeletal: no clubbing / cyanosis. No joint deformity upper and lower extremities. Good ROM, no contractures, no atrophy. Normal muscle tone.  Skin: no rashes, lesions, ulcers. No induration. Pale.  Negative for overt ecchymosis on physical exam. Small area of chronic venous changes with mild ecchymosis on left anterior lower extremity approximately 4-6 cm in diameter.  Neurologic: Sensation intact. Strength 5/5 in all 4.  Psychiatric: Normal judgment and insight. Alert and oriented x 3. Normal mood.   EKG: independently reviewed, showing  atrial fibrillation with rate of 83, QTc 442  Chest x-ray on Admission: I personally reviewed and I agree with radiologist reading as below.  DG Chest Portable 1 View  Result Date: 09/09/2022 CLINICAL DATA:  Weakness.  Evaluate for edema. EXAM: PORTABLE CHEST 1 VIEW COMPARISON:  Chest radiograph 11/27/2021. FINDINGS: 1606 hours. Well-expanded lungs with mild pulmonary vascular prominence. No focal airspace opacities. Indistinctness of the left costophrenic sulcus may reflect trace pleural effusion. Normal heart size and mediastinal contours. Calcifications of the aortic arch. No acute bony abnormalities. Visualized portions of the upper abdomen are unremarkable. IMPRESSION: Possible mild pulmonary edema with question of trace left pleural effusion. Electronically Signed   By: Emmit Alexanders M.D   On: 09/09/2022 16:23    Labs on Admission: I have personally reviewed following labs  CBC: Recent Labs  Lab 09/09/22 1511  WBC 6.1  HGB 5.7*  HCT 22.0*  MCV 80.6  PLT 726   Basic Metabolic Panel: Recent Labs  Lab 09/09/22 1511  NA 141  K 4.6  CL 108  CO2 23  GLUCOSE 115*  BUN 36*  CREATININE 2.25*  CALCIUM 9.0   GFR: Estimated Creatinine Clearance: 43.8 mL/min (A) (by C-G formula based on SCr of  2.25 mg/dL (H)).  CBG: Recent Labs  Lab 09/09/22 1522  GLUCAP 102*   Urine analysis:    Component Value Date/Time   COLORURINE YELLOW (A) 09/09/2022 1511   APPEARANCEUR CLEAR (A) 09/09/2022 1511   LABSPEC 1.014 09/09/2022 1511   PHURINE 5.0 09/09/2022 1511   GLUCOSEU NEGATIVE 09/09/2022 1511   HGBUR NEGATIVE 09/09/2022 1511   BILIRUBINUR NEGATIVE 09/09/2022 1511   KETONESUR NEGATIVE 09/09/2022 1511   PROTEINUR NEGATIVE 09/09/2022 1511   NITRITE NEGATIVE 09/09/2022 1511   LEUKOCYTESUR NEGATIVE 09/09/2022 1511   CRITICAL CARE Performed by: Dr. Tobie Poet  Total critical care time: 30 minutes  Critical care time was exclusive of separately billable procedures and treating other patients.  Critical care was necessary to treat or prevent imminent or life-threatening deterioration.  Critical care was time spent personally by me on the following activities: development of treatment plan with patient and/or surrogate as well as nursing, discussions with consultants, evaluation of patient's response to treatment, examination of patient, obtaining history from patient or surrogate, ordering and performing treatments and interventions, ordering and review of laboratory studies, ordering and review of radiographic studies, pulse oximetry and re-evaluation of patient's condition.  This document was prepared using Dragon Voice Recognition software and may include unintentional dictation errors.  Dr. Tobie Poet [facility]  If 7PM-7AM, please contact overnight-coverage provider If 7AM-7PM, please contact day coverage provider [email or web address]  09/09/2022, 6:34 PM

## 2022-09-09 NOTE — Assessment & Plan Note (Signed)
-  Continue amiodarone 200 mg p.o. twice daily.  Rate well-controlled.

## 2022-09-09 NOTE — ED Provider Notes (Signed)
[facility] Provider Note    Event Date/Time   First MD Initiated Contact with Patient 09/09/22 1516     (approximate)   History   Weakness   HPI  Kevin Gross is a 67 y.o. male with past medical history including PAD as well as cirrhosis on Plavix to the ER for evaluation after outpatient blood work showed acute anemia.  States has been feeling weak for a few weeks.  Denies any numbness or tingling.  Denies any melena or hematochezia.  Has required blood transfusions in the past has not received this recently.  Denies any nosebleeds.   Physical Exam   Triage Vital Signs: ED Triage Vitals  Enc Vitals Group     BP 09/09/22 1500 (!) 119/56     Pulse Rate 09/09/22 1500 66     Resp 09/09/22 1500 20     Temp 09/09/22 1500 98.1 F (36.7 C)     Temp src --      SpO2 09/09/22 1500 98 %     Weight --      Height --      Head Circumference --      Peak Flow --      Pain Score 09/09/22 1507 0     Pain Loc --      Pain Edu? --      Excl. in [place]? --     Most recent vital signs: Vitals:   09/09/22 1500  BP: (!) 119/56  Pulse: 66  Resp: 20  Temp: 98.1 F (36.7 C)  SpO2: 98%     Constitutional: Alert  Eyes: Conjunctivae are pale.  Head: Atraumatic. Nose: No congestion/rhinnorhea. Mouth/Throat: Mucous membranes are moist.   Neck: Painless ROM.  Cardiovascular:   Good peripheral circulation. Respiratory: Normal respiratory effort.  No retractions.  Gastrointestinal: Soft and nontender.  Musculoskeletal:  no deformity Neurologic:  MAE spontaneously. No gross focal neurologic deficits are appreciated.  Skin:  Skin is warm, dry and intact. No rash noted. Psychiatric: Mood and affect are normal. Speech and behavior are normal.    ED Results / Procedures / Treatments   Labs (all labs ordered are listed, but only abnormal results are displayed) Labs Reviewed  BASIC METABOLIC PANEL - Abnormal; Notable for the following components:       Result Value   Glucose, Bld 115 (*)    BUN 36 (*)    Creatinine, Ser 2.25 (*)    GFR, Estimated 32 (*)    All other components within normal limits  CBC - Abnormal; Notable for the following components:   RBC 2.73 (*)    Hemoglobin 5.7 (*)    HCT 22.0 (*)    MCH 20.9 (*)    MCHC 25.9 (*)    RDW 21.6 (*)    All other components within normal limits  URINALYSIS, ROUTINE W REFLEX MICROSCOPIC - Abnormal; Notable for the following components:   Color, Urine YELLOW (*)    APPearance CLEAR (*)    All other components within normal limits  CBG MONITORING, ED - Abnormal; Notable for the following components:   Glucose-Capillary 102 (*)    All other components within normal limits  PROTIME-INR  HEPATIC FUNCTION PANEL  VITAMIN B12  FOLATE  IRON AND TIBC  FERRITIN  RETICULOCYTES  TYPE AND SCREEN  PREPARE RBC (CROSSMATCH)     EKG  ED ECG REPORT I, Merlyn Lot, the attending physician, personally viewed and interpreted this ECG.   Date:  09/09/2022  EKG Time: 15:20  Rate: 80  Rhythm: afib  Axis: normal  Intervals:normal  ST&T Change: no stemi, no depressions    RADIOLOGY Please see ED Course for my review and interpretation.  I personally reviewed all radiographic images ordered to evaluate for the above acute complaints and reviewed radiology reports and findings.  These findings were personally discussed with the patient.  Please see medical record for radiology report.    PROCEDURES:  Critical Care performed: Yes, see critical care procedure note(s)  .Critical Care  Performed by: Merlyn Lot, MD Authorized by: Merlyn Lot, MD   Critical care provider statement:    Critical care time (minutes):  35   Critical care was necessary to treat or prevent imminent or life-threatening deterioration of the following conditions: symptomatic anemia.   Critical care was time spent personally by me on the following activities:  Ordering and performing  treatments and interventions, ordering and review of laboratory studies, ordering and review of radiographic studies, pulse oximetry, re-evaluation of patient's condition, review of old charts, obtaining history from patient or surrogate, examination of patient, evaluation of patient's response to treatment, discussions with primary provider, discussions with consultants and development of treatment plan with patient or surrogate    MEDICATIONS ORDERED IN ED: Medications  0.9 %  sodium chloride infusion (has no administration in time range)     IMPRESSION / MDM / [place] / ED COURSE  I reviewed the triage vital signs and the nursing notes.                              Differential diagnosis includes, but is not limited to, anemia, anemia of chronic disease, iron deficiency anemia, GI bleed, acute loss anemia  Patient presenting to the ER for evaluation of symptoms as described above.  Based on symptoms, risk factors and considered above differential, this presenting complaint could reflect a potentially life-threatening illness therefore the patient will be placed on continuous pulse oximetry and telemetry for monitoring.  Laboratory evaluation will be sent to evaluate for the above complaints.  Blood work reviewed does show evidence of significant anemia to 5.7.  He has light brown stool that is guaiac negative.  Have added on anemia panel.  Given his extensive comorbidities extensive symptomatic anemia requiring transfusion admission to the hospital for transfusion and reassessment.  I have ordered 1 unit of PRBCs and have consulted hospitalist for admission.    FINAL CLINICAL IMPRESSION(S) / ED DIAGNOSES   Final diagnoses:  Symptomatic anemia     Rx / DC Orders   ED Discharge Orders     None        Note:  This document was prepared using Dragon voice recognition software and may include unintentional dictation errors.    Merlyn Lot, MD 09/09/22 1622

## 2022-09-09 NOTE — Assessment & Plan Note (Signed)
-  Presumed secondary to prerenal secondary to iron deficiency anemia - Treat per anemia with blood transfusion - Repeat BMP in the am

## 2022-09-09 NOTE — Assessment & Plan Note (Signed)
-  Plavix 75 mg daily, aspirin 81 mg daily has not been resumed on admission due to symptomatic anemia.  Plan for EGD/colonoscopy on Monday so will need Plavix washout as well

## 2022-09-09 NOTE — Assessment & Plan Note (Addendum)
-  Aspirin 81 mg daily, clopidogrel 75 mg daily not resumed on admission - AM team to resume when appropriate

## 2022-09-09 NOTE — Assessment & Plan Note (Addendum)
-  lisinopril 10 mg, chlorthalidone 25 every morning, not resumed on admission due to acute kidney injury; will resume when appropriate - Labetalol 5 mg IV every 3 hours as needed for SBP > 175, 4 doses ordered

## 2022-09-10 DIAGNOSIS — J432 Centrilobular emphysema: Secondary | ICD-10-CM

## 2022-09-10 DIAGNOSIS — D649 Anemia, unspecified: Secondary | ICD-10-CM | POA: Diagnosis not present

## 2022-09-10 DIAGNOSIS — I10 Essential (primary) hypertension: Secondary | ICD-10-CM

## 2022-09-10 DIAGNOSIS — N189 Chronic kidney disease, unspecified: Secondary | ICD-10-CM

## 2022-09-10 DIAGNOSIS — N179 Acute kidney failure, unspecified: Secondary | ICD-10-CM

## 2022-09-10 LAB — BASIC METABOLIC PANEL
Anion gap: 7 (ref 5–15)
BUN: 29 mg/dL — ABNORMAL HIGH (ref 8–23)
CO2: 22 mmol/L (ref 22–32)
Calcium: 8.6 mg/dL — ABNORMAL LOW (ref 8.9–10.3)
Chloride: 109 mmol/L (ref 98–111)
Creatinine, Ser: 1.85 mg/dL — ABNORMAL HIGH (ref 0.61–1.24)
GFR, Estimated: 40 mL/min — ABNORMAL LOW (ref >60)
Glucose, Bld: 87 mg/dL (ref 70–99)
Potassium: 4.2 mmol/L (ref 3.5–5.1)
Sodium: 138 mmol/L (ref 135–145)

## 2022-09-10 LAB — TYPE AND SCREEN
ABO/RH(D): A POS
Antibody Screen: NEGATIVE
Unit division: 0
Unit division: 0

## 2022-09-10 LAB — CBC
HCT: 27.3 % — ABNORMAL LOW (ref 39.0–52.0)
Hemoglobin: 7.7 g/dL — ABNORMAL LOW (ref 13.0–17.0)
MCH: 22.9 pg — ABNORMAL LOW (ref 26.0–34.0)
MCHC: 28.2 g/dL — ABNORMAL LOW (ref 30.0–36.0)
MCV: 81.3 fL (ref 80.0–100.0)
Platelets: 267 10*3/uL (ref 150–400)
RBC: 3.36 MIL/uL — ABNORMAL LOW (ref 4.22–5.81)
RDW: 20.4 % — ABNORMAL HIGH (ref 11.5–15.5)
WBC: 6.9 10*3/uL (ref 4.0–10.5)
nRBC: 0 % (ref 0.0–0.2)

## 2022-09-10 LAB — BPAM RBC
Blood Product Expiration Date: 202402112359
Blood Product Expiration Date: 202402112359
ISSUE DATE / TIME: 202401121614
ISSUE DATE / TIME: 202401122035
Unit Type and Rh: 6200
Unit Type and Rh: 6200

## 2022-09-10 LAB — VITAMIN B12: Vitamin B-12: 283 pg/mL (ref 180–914)

## 2022-09-10 LAB — PROTIME-INR
INR: 1.1 (ref 0.8–1.2)
Prothrombin Time: 13.9 seconds (ref 11.4–15.2)

## 2022-09-10 MED ORDER — PEG 3350-KCL-NA BICARB-NACL 420 G PO SOLR
4000.0000 mL | Freq: Once | ORAL | Status: AC
  Administered 2022-09-11: 4000 mL via ORAL
  Filled 2022-09-10: qty 4000

## 2022-09-10 NOTE — ED Notes (Signed)
Pt given meal tray and is sitting up eating.

## 2022-09-10 NOTE — ED Notes (Addendum)
Per the tech in the blood bank the pt is not candidate to receive a third unit of blood at this time. The pt has been given the first order of blood for one unit and 1 of 2 units of PRBC of the second order totaling 2 units. Per the blood bank as the pt is not actively hemorrhaging at this time, his Hgb is at a stable level and his 3rd unit of PRBC can not be released. The provider Sharion Settler, NP., has been made aware at this time. Close monitoring continued.

## 2022-09-10 NOTE — Progress Notes (Signed)
Progress Note   Patient: Kevin Gross IWL:[id number] DOB: [birth year] DOA: 09/09/2022     1 DOS: the patient was seen and examined on 09/10/2022   Brief hospital course: Kevin Gross is a 67 year old male with history of PAD status post bilateral endarterectomy with vein patch angioplasty, stent placement and left common iliac artery, left external iliac artery stent placement, history of alcoholic liver cirrhosis, heart failure, clotting disorder, hyperlipidemia, hypertension, sleep apnea, who presents emergency department for chief concerns of weakness.  Initial vitals in the emergency department showed temperature of 98, respiration rate of 20, heart rate of 66, blood pressure 119/56, SpO2 98% on room air.  Serum sodium is 143, potassium 4.6, chloride 108, bicarb 23, BUN of 36, serum creatinine of 2.25, EGFR 32, nonfasting blood glucose 115, WBC 6.1, hemoglobin 5.7, platelets 283.  UA was negative for leukocytes and nitrites.  ED treatment: Anemia panel ordered, type and screen  1/13: GI consult  Assessment and Plan: * Symptomatic anemia Severe acute on chronic anemia - Baseline hemoglobin is 7.3-8.3, based on values and April 2023 - Etiology workup in progress at this time, anemia panel does show low iron levels - Differentials include chronic CKD versus iron deficiency  - Status post 2 units of PRBC transfusion on admission - H/H timed recheck for 2100 on day of admission and CBC in the a.m. -GI planning EGD/colonoscopy on Monday to rule out any large lesions -Hold Plavix - will need genetics referral as an outpatient due to history of > 20 polyps per GI  Acute kidney injury superimposed on chronic kidney disease ([place]) - Presumed secondary to prerenal secondary to iron deficiency anemia - Treat per anemia with blood transfusion - Avoid nephrotoxic medications  Lab Results  Component Value Date   CREATININE 1.85 (H) 09/10/2022   CREATININE 2.25 (H) 09/09/2022    CREATININE 1.74 (H) 12/24/2021     Essential hypertension - lisinopril 10 mg, chlorthalidone 25 every morning, not resumed on admission due to acute kidney injury; will resume when appropriate - Labetalol 5 mg IV every 3 hours as needed for SBP > 175, 4 doses ordered   COPD (chronic obstructive pulmonary disease) (HCC) - Continue albuterol, 1 to 2 puff inhalation every 6 hours as needed for wheezing and shortness of breath  PAD (peripheral artery disease) (HCC) - Plavix 75 mg daily, aspirin 81 mg daily has not been resumed on admission due to symptomatic anemia.  Plan for EGD/colonoscopy on Monday so will need Plavix washout as well   PAF (paroxysmal atrial fibrillation) (HCC) - Continue amiodarone 200 mg p.o. twice daily.  Rate well-controlled.  Stage 3b chronic kidney disease (CKD) ([place]) - Patient baseline serum creatinine is 1.74-2.16/eGFR is 30-43.  CAD (coronary artery disease), native coronary artery - Aspirin 81 mg daily, clopidogrel 75 mg daily not resumed on admission due to concern for GI bleed - resume when appropriate, likely post EGD/colonoscopy on Monday        Subjective: Agreeable to have GI evaluate him while here as he has not seen his GI specialist over many years.  Physical Exam: Vitals:   09/10/22 0830 09/10/22 0900 09/10/22 0930 09/10/22 1045  BP: 128/61 (!) 149/65 (!) 148/66 (!) 141/72  Pulse: 80 61 76 86  Resp: 17 (!) '21 12 14  '$ Temp:    98.2 F (36.8 C)  TempSrc:    Oral  SpO2: 98% 100% 98% 100%  Weight:      Height:  67 year old male lying in the bed comfortably without any acute distress Lungs clear to auscultation bilaterally Heart regular rate and rhythm Abdomen soft, benign Neuro alert and awake, nonfocal Skin Small area of chronic venous changes with mild ecchymosis on left anterior lower extremity approximately 4-6 cm in diameter.   Data Reviewed:  Hemoglobin 7.7, creatinine 1.85  Family Communication: None at  bedside  Disposition: Status is: Inpatient Remains inpatient appropriate because: Getting EGD/colonoscopy on Monday per GI  Planned Discharge Destination: Home   DVT prophylaxis-SCDs Time spent: 35 minutes  Author: Max Sane, MD 09/10/2022 1:25 PM  For on call review [email or web address].

## 2022-09-10 NOTE — ED Notes (Signed)
Assumed care from [place]. Pt resting comfortably in bed at this time. Pt denies any current needs or questions. Call light with in reach.

## 2022-09-10 NOTE — Consult Note (Addendum)
Consultation  Referring Provider:  Hospitalist     Admit date: 09/09/2022 Consult date: 09/10/2022         Reason for Consultation:  IDA            HPI:   Kevin Gross is a 67 y.o. gentleman with history of PVD on plavix with last dose yesterday, COPD, CKD, Hypertension here with IDA found on labs with his Nephrologist. His hemoglobin was 5.7 and he was symptomatic so he was sent to the ED. On ED evaluation he had brown stool that was guaic negative. His last colonoscopy was in 2019 with some small polyps but on a colonoscopy the year prior he had 18-19 polyps with some as big as 1.6 cm. His hemoglobin was normal up until last year when he underwent several vascular surgeries for PVD and since then has been in the 8-9 range. He denies any hematochezia or melena. No family history of GI malignancies.   Past Medical History:  Diagnosis Date   Alcoholic cirrhosis of liver (HCC)    CHF (congestive heart failure) (HCC)    Clotting disorder (HCC)    Bilateral legs-pt states plaque build up in both legs requiring intervention   Complication of anesthesia    pt states that years ago during colonoscopy at [facility] they had to abort the procedure due to some "cardiac issue" had to see cardiologist   COPD (chronic obstructive pulmonary disease) ([place])    Hyperlipidemia    Hypertension    Pre-diabetes    Sleep apnea    uses cpap    Past Surgical History:  Procedure Laterality Date   ABDOMINAL AORTOGRAM W/LOWER EXTREMITY N/A 10/20/2021   Procedure: ABDOMINAL AORTOGRAM W/LOWER EXTREMITY;  Surgeon: Wellington Hampshire, MD;  Location: [facility];  Service: Cardiovascular;  Laterality: N/A;   ALLOGRAFT APPLICATION Bilateral 12/05/8117   Procedure: ALLOGRAFT APPLICATION;  Surgeon: Serafina Mitchell, MD;  Location: [place];  Service: Vascular;  Laterality: Bilateral;   ANGIOPLASTY Left 11/26/2021   Procedure: LEFT INFRARENAL LITHOTRIPSY;  Surgeon: Serafina Mitchell, MD;  Location: [place];  Service: Vascular;   Laterality: Left;   APPLICATION OF WOUND VAC Bilateral 12/13/2021   Procedure: APPLICATION OF WOUND VAC;  Surgeon: Broadus John, MD;  Location: [place];  Service: Vascular;  Laterality: Bilateral;   [place]   COLONOSCOPY     COLONOSCOPY WITH PROPOFOL N/A 01/17/2018   Procedure: COLONOSCOPY WITH PROPOFOL;  Surgeon: Toledo, Benay Pike, MD;  Location: [facility];  Service: Gastroenterology;  Laterality: N/A;   ENDARTERECTOMY FEMORAL Bilateral 11/26/2021   Procedure: BILATERAL FEMORAL ENDARTERECTOMY WITH VEIN PATCH ANGIOPLASTY;  Surgeon: Serafina Mitchell, MD;  [facility];  Service: Vascular;  Laterality: Bilateral;   ESOPHAGOGASTRODUODENOSCOPY (EGD) WITH PROPOFOL N/A 01/17/2018   Procedure: ESOPHAGOGASTRODUODENOSCOPY (EGD) WITH PROPOFOL;  Surgeon: Toledo, Benay Pike, MD;  Location: [facility];  Service: Gastroenterology;  Laterality: N/A;   GROIN DEBRIDEMENT Bilateral 12/16/2021   Procedure: IRRIGATION AND DEBRIDEMENT OF BILATERAL GROINS;  Surgeon: Serafina Mitchell, MD;  Location[facility];  Service: Vascular;  Laterality: Bilateral;   INCISION AND DRAINAGE OF WOUND Bilateral 12/13/2021   Procedure: IRRIGATION AND DEBRIDEMENT OF BILATERAL GROIN WOUNDS;  Surgeon: Broadus John, MD;  Location: [facility];  Service: Vascular;  Laterality: Bilateral;   INSERTION OF ILIAC STENT Bilateral 11/26/2021   Procedure: BILATERAL ILIAC STENTING USING 16mX80mm INNOVA STENT ON LEFT ILIAC AND 852m59mm, 29m4m9mm VBX AND 29mm13m5cm VIABAHN STENT ON  RIGHT ILIAC;  Surgeon: Serafina Mitchell, MD;  Location: [place];  Service: Vascular;  Laterality: Bilateral;   ORIF ANKLE FRACTURE Right    PROSTATE BIOPSY N/A 04/01/2021   Procedure: PROSTATE BIOPSY Vernelle Emerald;  Surgeon: Royston Cowper, MD;  Location: [facility];  Service: Urology;  Laterality: N/A;   VEIN HARVEST Bilateral 11/26/2021   Procedure: VEIN HARVEST OF BILATERAL SPAHENOUS VEINS;  Surgeon: Serafina Mitchell, MD;  [facility];  Service:  Vascular;  Laterality: Bilateral;   WOUND DEBRIDEMENT Bilateral 12/22/2021   Procedure: INCISION AND DEBRIDEMENT OF BILATERAL GROINS;  Surgeon: Serafina Mitchell, MD;  [facility];  Service: Vascular;  Laterality: Bilateral;    Family History  Problem Relation Age of Onset   Heart disease Mother    Heart disease Brother     Social History   Tobacco Use   Smoking status: Former    Packs/day: 1.00    Years: 47.00    Total pack years: 47.00    Types: Cigarettes    Quit date: 2016    Years since quitting: 8.0    Passive exposure: Never   Smokeless tobacco: Never  Vaping Use   Vaping Use: Never used  Substance Use Topics   Alcohol use: Not Currently   Drug use: No    Prior to Admission medications   Medication Sig Start Date End Date Taking? Authorizing Provider  aspirin EC 81 MG tablet Take 81 mg by mouth every morning.   Yes [provider]  chlorthalidone (HYGROTON) 25 MG tablet Take 25 mg by mouth daily.   Yes [provider]  clopidogrel (PLAVIX) 75 MG tablet Take 1 tablet (75 mg total) by mouth daily. 12/06/21  Yes Baglia, Corrina, PA-C  ezetimibe (ZETIA) 10 MG tablet Take 1 tablet (10 mg total) by mouth daily. 04/10/18  Yes Gollan, Kathlene November, MD  lisinopril (ZESTRIL) 10 MG tablet Take 10 mg by mouth daily.   Yes [provider]  simvastatin (ZOCOR) 20 MG tablet Take 20 mg by mouth in the morning.   Yes [provider]  zolpidem (AMBIEN) 5 MG tablet Take 5 mg by mouth at bedtime.   Yes [provider]  acetaminophen (TYLENOL) 325 MG tablet Take 2 tablets (650 mg total) by mouth every 6 (six) hours as needed for mild pain (or Fever >/= 101). Patient not taking: Reported on 07/11/2022 02/22/19   Vaughan Basta, MD    Current Facility-Administered Medications  Medication Dose Route Frequency Provider Last Rate Last Admin   0.9 %  sodium chloride infusion (Manually program via Guardrails IV Fluids)   Intravenous Once Cox,  Amy N, DO       acetaminophen (TYLENOL) tablet 650 mg  650 mg Oral Q6H PRN Cox, Amy N, DO       Or   acetaminophen (TYLENOL) suppository 650 mg  650 mg Rectal Q6H PRN Cox, Amy N, DO       albuterol (PROVENTIL) (2.5 MG/3ML) 0.083% nebulizer solution 2.5 mg  2.5 mg Inhalation Q6H PRN Cox, Amy N, DO       allopurinol (ZYLOPRIM) tablet 300 mg  300 mg Oral BH-q7a Cox, Amy N, DO   300 mg at 09/10/22 4098   amiodarone (PACERONE) tablet 200 mg  200 mg Oral BID Cox, Amy N, DO       docusate sodium (COLACE) capsule 100 mg  100 mg Oral Daily PRN Cox, Amy N, DO       labetalol (NORMODYNE) injection  5 mg  5 mg Intravenous Q3H PRN Cox, Amy N, DO       ondansetron (ZOFRAN) tablet 4 mg  4 mg Oral Q6H PRN Cox, Amy N, DO       Or   ondansetron (ZOFRAN) injection 4 mg  4 mg Intravenous Q6H PRN Cox, Amy N, DO       senna-docusate (Senokot-S) tablet 1 tablet  1 tablet Oral QHS PRN Cox, Amy N, DO       simvastatin (ZOCOR) tablet 20 mg  20 mg Oral QPM Cox, Amy N, DO   20 mg at 09/09/22 1924   tamsulosin (FLOMAX) capsule 0.4 mg  0.4 mg Oral QPC supper Cox, Amy N, DO       zolpidem (AMBIEN) tablet 5 mg  5 mg Oral QHS PRN Cox, Amy N, DO       Current Outpatient Medications  Medication Sig Dispense Refill   aspirin EC 81 MG tablet Take 81 mg by mouth every morning.     chlorthalidone (HYGROTON) 25 MG tablet Take 25 mg by mouth daily.     clopidogrel (PLAVIX) 75 MG tablet Take 1 tablet (75 mg total) by mouth daily. 30 tablet 2   ezetimibe (ZETIA) 10 MG tablet Take 1 tablet (10 mg total) by mouth daily. 90 tablet 3   lisinopril (ZESTRIL) 10 MG tablet Take 10 mg by mouth daily.     simvastatin (ZOCOR) 20 MG tablet Take 20 mg by mouth in the morning.     zolpidem (AMBIEN) 5 MG tablet Take 5 mg by mouth at bedtime.     acetaminophen (TYLENOL) 325 MG tablet Take 2 tablets (650 mg total) by mouth every 6 (six) hours as needed for mild pain (or Fever >/= 101). (Patient not taking: Reported on 07/11/2022) 30 tablet 0     Allergies as of 09/09/2022   (No Known Allergies)     Review of Systems:    All systems reviewed and negative except where noted in HPI.  Review of Systems  Constitutional:  Positive for malaise/fatigue. Negative for chills and fever.  Respiratory:  Negative for cough.   Cardiovascular:  Negative for chest pain.  Gastrointestinal:  Negative for abdominal pain, blood in stool, constipation, diarrhea, melena, nausea and vomiting.  Musculoskeletal:  Negative for joint pain.  Skin:  Negative for rash.  Neurological:  Negative for focal weakness.  Psychiatric/Behavioral:  Negative for substance abuse.   All other systems reviewed and are negative.      Physical Exam:  Vital signs in last 24 hours: Temp:  [98 F (36.7 C)-98.9 F (37.2 C)] 98.9 F (37.2 C) (01/13 0730) Pulse Rate:  [44-98] 72 (01/13 0730) Resp:  [12-20] 15 (01/13 0730) BP: (109-159)/(55-82) 159/66 (01/13 0730) SpO2:  [98 %-100 %] 99 % (01/13 0730) Weight:  [644 kg] 124 kg (01/12 1524) Last BM Date : 09/08/22 General:   Pleasant in NAD Head:  Normocephalic and atraumatic. Eyes:   No icterus.   Conjunctiva pink. Mouth: Mucosa pink moist, no lesions. Neck:  Supple; no masses felt Lungs:  No respiratory distress Abdomen:   Flat, soft, nondistended, nontender Msk:  No clubbing or cyanosis. Strength 5/5. Symmetrical without gross deformities. Neurologic:  No focal deficits Skin:  Warm, dry, pink without significant lesions or rashes. Psych:  Alert and cooperative. Normal affect.  LAB RESULTS: Recent Labs    09/09/22 1511 09/09/22 2213 09/10/22 0551  WBC 6.1  --  6.9  HGB 5.7* 7.3* 7.7*  HCT  22.0* 26.5* 27.3*  PLT 283  --  267   BMET Recent Labs    09/09/22 1511 09/10/22 0551  NA 141 138  K 4.6 4.2  CL 108 109  CO2 23 22  GLUCOSE 115* 87  BUN 36* 29*  CREATININE 2.25* 1.85*  CALCIUM 9.0 8.6*   LFT Recent Labs    09/09/22 1511  PROT 7.0  ALBUMIN 3.8  AST 17  ALT 14  ALKPHOS 46   BILITOT 0.5  BILIDIR <0.1  IBILI NOT CALCULATED   PT/INR Recent Labs    09/10/22 0551  LABPROT 13.9  INR 1.1    STUDIES: DG Chest Portable 1 View  Result Date: 09/09/2022 CLINICAL DATA:  Weakness.  Evaluate for edema. EXAM: PORTABLE CHEST 1 VIEW COMPARISON:  Chest radiograph 11/27/2021. FINDINGS: 1606 hours. Well-expanded lungs with mild pulmonary vascular prominence. No focal airspace opacities. Indistinctness of the left costophrenic sulcus may reflect trace pleural effusion. Normal heart size and mediastinal contours. Calcifications of the aortic arch. No acute bony abnormalities. Visualized portions of the upper abdomen are unremarkable. IMPRESSION: Possible mild pulmonary edema with question of trace left pleural effusion. Electronically Signed   By: Emmit Alexanders M.D   On: 09/09/2022 16:23       Impression / Plan:   67 y/o  gentleman with history of PVD on plavix with last dose yesterday, COPD, CKD, Hypertension here with IDA found on labs with his Nephrologist. Patient has had a pretty precipitous drop in hemoglobin from last year to after he got his multiple vascular surgeries. Will need EGD/Colonoscopy but question is timing. Discussed with patient how we can remove small polyps on plavix but nothing large and he wishes to proceed with EGD/Colonoscopy sooner rather than waiting for his plavix to wash out. Will proceed with EGD/Colonoscopy on Monday to rule out any large lesions  - ok for diet today, clears tomorrow - maintain active type and screen - transfuse to keep hemoglobin > 7 - continue to hold Plavix - further recs after EGD/Colonoscopy on Monday - orders entered for procedure - will need genetics referral as an outpatient due to history of > 20 polyps  Raylene Miyamoto MD, MPH [place]

## 2022-09-11 DIAGNOSIS — D649 Anemia, unspecified: Secondary | ICD-10-CM | POA: Diagnosis not present

## 2022-09-11 LAB — CBC
HCT: 26.3 % — ABNORMAL LOW (ref 39.0–52.0)
Hemoglobin: 7.5 g/dL — ABNORMAL LOW (ref 13.0–17.0)
MCH: 22.8 pg — ABNORMAL LOW (ref 26.0–34.0)
MCHC: 28.5 g/dL — ABNORMAL LOW (ref 30.0–36.0)
MCV: 79.9 fL — ABNORMAL LOW (ref 80.0–100.0)
Platelets: 246 10*3/uL (ref 150–400)
RBC: 3.29 MIL/uL — ABNORMAL LOW (ref 4.22–5.81)
RDW: 20.8 % — ABNORMAL HIGH (ref 11.5–15.5)
WBC: 6.6 10*3/uL (ref 4.0–10.5)
nRBC: 0 % (ref 0.0–0.2)

## 2022-09-11 LAB — BASIC METABOLIC PANEL
Anion gap: 7 (ref 5–15)
BUN: 32 mg/dL — ABNORMAL HIGH (ref 8–23)
CO2: 22 mmol/L (ref 22–32)
Calcium: 8.6 mg/dL — ABNORMAL LOW (ref 8.9–10.3)
Chloride: 107 mmol/L (ref 98–111)
Creatinine, Ser: 1.88 mg/dL — ABNORMAL HIGH (ref 0.61–1.24)
GFR, Estimated: 39 mL/min — ABNORMAL LOW (ref >60)
Glucose, Bld: 88 mg/dL (ref 70–99)
Potassium: 3.9 mmol/L (ref 3.5–5.1)
Sodium: 136 mmol/L (ref 135–145)

## 2022-09-11 LAB — GLUCOSE, CAPILLARY: Glucose-Capillary: 138 mg/dL — ABNORMAL HIGH (ref 70–99)

## 2022-09-11 NOTE — Progress Notes (Signed)
PROGRESS NOTE    Kevin Gross  NWG:[id number] DOB: [birth year] DOA: 09/09/2022 PCP: Rutherford Limerick, PA   Brief Narrative:  [name] is a 67 year old male with history of PAD status post bilateral endarterectomy with vein patch angioplasty, stent placement and left common iliac artery, left external iliac artery stent placement, history of alcoholic liver cirrhosis, heart failure, clotting disorder, hyperlipidemia, hypertension, sleep apnea, who presents emergency department for chief concerns of weakness.   Initial vitals in the emergency department showed temperature of 98, respiration rate of 20, heart rate of 66, blood pressure 119/56, SpO2 98% on room air.   Serum sodium is 143, potassium 4.6, chloride 108, bicarb 23, BUN of 36, serum creatinine of 2.25, EGFR 32, nonfasting blood glucose 115, WBC 6.1, hemoglobin 5.7, platelets 283.   UA was negative for leukocytes and nitrites.   ED treatment: Anemia panel ordered, type and screen   1/13: GI consult  Assessment & Plan:   Symptomatic anemia -Baseline hemoglobin is 7.3-8.3, based on values and April 2023 -anemia panel does show low iron levels, Differentials include chronic CKD versus iron deficiency  -Status post 2 units of PRBC transfusion on admission -GI planning EGD/colonoscopy on Monday to rule out any large lesions -Hold Plavix.  H&H 7.5/26.3.  Continue to monitor closely and transfuse if less than 7 - will need genetics referral as an outpatient due to history of > 20 polyps per GI   Acute kidney injury superimposed on chronic kidney disease ([place]) -Presumed secondary to prerenal secondary to iron deficiency anemia -Treat per anemia with blood transfusion -Avoid nephrotoxic medications   Essential hypertension -lisinopril 10 mg, chlorthalidone 25 every morning, not resumed on admission due to acute kidney injury; will resume when appropriate -Labetalol 5 mg IV every 3 hours as needed for SBP > 175, 4 doses  ordered -BP remained stable     COPD (chronic obstructive pulmonary disease) ([place]) -Not in exacerbation.  No wheezing noted on exam.  On room air -Continue albuterol, 1 to 2 puff inhalation every 6 hours as needed for wheezing and shortness of breath   PAD (peripheral artery disease) (HCC) -Plavix 75 mg daily, aspirin 81 mg daily has not been resumed on admission due to symptomatic anemia.  -Plan for EGD/colonoscopy on Monday so will need Plavix washout as well     PAF (paroxysmal atrial fibrillation) (HCC) -Continue amiodarone 200 mg p.o. twice daily.  Rate well-controlled.   CAD (coronary artery disease), native coronary artery -Aspirin 81 mg daily, clopidogrel 75 mg daily not resumed on admission due to concern for GI bleed -Patient denies any chest pain    DVT prophylaxis: SCD, no chemical prophylaxis due to GI bleeding Code Status: Full code Family Communication:  None present at bedside.  Plan of care discussed with patient in length and he verbalized understanding and agreed with it. Disposition Plan: To be determined  Consultants:  GI  Procedures:  None  Antimicrobials:  None  Status is: Inpatient    Subjective: Patient seen and examined.  Sitting comfortably on the bed.  Reports that overall he feels better.  No shortness of breath, chest pain or melena.  No acute events overnight.  Objective: Vitals:   09/10/22 1941 09/10/22 2211 09/11/22 0507 09/11/22 0740  BP: 134/66  126/83 (!) 127/59  Pulse: 73 65 74 (!) 55  Resp: '16  18 17  '$ Temp: 98.8 F (37.1 C)  98.4 F (36.9 C) 97.7 F (36.5 C)  TempSrc: Oral  Oral  Oral  SpO2: 98%  97% 98%  Weight:      Height:        Intake/Output Summary (Last 24 hours) at 09/11/2022 1245 Last data filed at 09/11/2022 1020 Gross per 24 hour  Intake 720 ml  Output --  Net 720 ml   Filed Weights   09/09/22 1524  Weight: 124 kg    Examination:  General exam: Appears calm and comfortable, on room air, communicating  well. Respiratory system: Clear to auscultation. Respiratory effort normal. Cardiovascular system: S1 & S2 heard, RRR. No JVD, murmurs, rubs, gallops or clicks. No pedal edema. Gastrointestinal system: Abdomen is nondistended, soft and nontender. No organomegaly or masses felt. Normal bowel sounds heard. Central nervous system: Alert and oriented. No focal neurological deficits. Extremities: Symmetric 5 x 5 power. Skin: No rashes, lesions or ulcers Psychiatry: Judgement and insight appear normal. Mood & affect appropriate.    Data Reviewed: I have personally reviewed following labs and imaging studies  CBC: Recent Labs  Lab 09/09/22 1511 09/09/22 2213 09/10/22 0551 09/11/22 0621  WBC 6.1  --  6.9 6.6  HGB 5.7* 7.3* 7.7* 7.5*  HCT 22.0* 26.5* 27.3* 26.3*  MCV 80.6  --  81.3 79.9*  PLT 283  --  267 983   Basic Metabolic Panel: Recent Labs  Lab 09/09/22 1511 09/10/22 0551 09/11/22 0621  NA 141 138 136  K 4.6 4.2 3.9  CL 108 109 107  CO2 '23 22 22  '$ GLUCOSE 115* 87 88  BUN 36* 29* 32*  CREATININE 2.25* 1.85* 1.88*  CALCIUM 9.0 8.6* 8.6*   GFR: Estimated Creatinine Clearance: 52.4 mL/min (A) (by C-G formula based on SCr of 1.88 mg/dL (H)). Liver Function Tests: Recent Labs  Lab 09/09/22 1511  AST 17  ALT 14  ALKPHOS 46  BILITOT 0.5  PROT 7.0  ALBUMIN 3.8   No results for input(s): "LIPASE", "AMYLASE" in the last 168 hours. No results for input(s): "AMMONIA" in the last 168 hours. Coagulation Profile: Recent Labs  Lab 09/10/22 0551  INR 1.1   Cardiac Enzymes: No results for input(s): "CKTOTAL", "CKMB", "CKMBINDEX", "TROPONINI" in the last 168 hours. BNP (last 3 results) No results for input(s): "PROBNP" in the last 8760 hours. HbA1C: No results for input(s): "HGBA1C" in the last 72 hours. CBG: Recent Labs  Lab 09/09/22 1522  GLUCAP 102*   Lipid Profile: No results for input(s): "CHOL", "HDL", "LDLCALC", "TRIG", "CHOLHDL", "LDLDIRECT" in the last 72  hours. Thyroid Function Tests: Recent Labs    09/09/22 1511  TSH 1.041   Anemia Panel: Recent Labs    09/09/22 1511 09/10/22 0551  VITAMINB12  --  283  FOLATE 15.3  --   FERRITIN 4*  --   TIBC 461*  --   IRON 19*  --   RETICCTPCT 2.1  --    Sepsis Labs: No results for input(s): "PROCALCITON", "LATICACIDVEN" in the last 168 hours.  No results found for this or any previous visit (from the past 240 hour(s)).    Radiology Studies: DG Chest Portable 1 View  Result Date: 09/09/2022 CLINICAL DATA:  Weakness.  Evaluate for edema. EXAM: PORTABLE CHEST 1 VIEW COMPARISON:  Chest radiograph 11/27/2021. FINDINGS: 1606 hours. Well-expanded lungs with mild pulmonary vascular prominence. No focal airspace opacities. Indistinctness of the left costophrenic sulcus may reflect trace pleural effusion. Normal heart size and mediastinal contours. Calcifications of the aortic arch. No acute bony abnormalities. Visualized portions of the upper abdomen are unremarkable. IMPRESSION: Possible mild  pulmonary edema with question of trace left pleural effusion. Electronically Signed   By: Emmit Alexanders M.D   On: 09/09/2022 16:23    Scheduled Meds:  sodium chloride   Intravenous Once   allopurinol  300 mg Oral BH-q7a   amiodarone  200 mg Oral BID   polyethylene glycol-electrolytes  4,000 mL Oral Once   simvastatin  20 mg Oral QPM   tamsulosin  0.4 mg Oral QPC supper   Continuous Infusions:   LOS: 2 days   Time spent: 35 minutes   Medora Roorda Loann Quill, MD [facility]  If 7PM-7AM, please contact night-coverage [email or web address] 09/11/2022, 12:45 PM

## 2022-09-12 ENCOUNTER — Other Ambulatory Visit: Payer: Self-pay

## 2022-09-12 ENCOUNTER — Encounter
Admission: EM | Disposition: A | Discharge status: Home / Self Care | Payer: Self-pay | Source: Home / Self Care | Attending: Student

## 2022-09-12 ENCOUNTER — Inpatient Hospital Stay: Payer: Medicare PPO | Admitting: Anesthesiology

## 2022-09-12 ENCOUNTER — Encounter: Payer: Self-pay | Admitting: Internal Medicine

## 2022-09-12 DIAGNOSIS — D649 Anemia, unspecified: Secondary | ICD-10-CM | POA: Diagnosis not present

## 2022-09-12 HISTORY — PX: COLONOSCOPY WITH PROPOFOL: SHX5780

## 2022-09-12 HISTORY — PX: ESOPHAGOGASTRODUODENOSCOPY (EGD) WITH PROPOFOL: SHX5813

## 2022-09-12 LAB — CBC
HCT: 26.5 % — ABNORMAL LOW (ref 39.0–52.0)
Hemoglobin: 7.7 g/dL — ABNORMAL LOW (ref 13.0–17.0)
MCH: 23.1 pg — ABNORMAL LOW (ref 26.0–34.0)
MCHC: 29.1 g/dL — ABNORMAL LOW (ref 30.0–36.0)
MCV: 79.6 fL — ABNORMAL LOW (ref 80.0–100.0)
Platelets: 241 10*3/uL (ref 150–400)
RBC: 3.33 MIL/uL — ABNORMAL LOW (ref 4.22–5.81)
RDW: 20.7 % — ABNORMAL HIGH (ref 11.5–15.5)
WBC: 6.7 10*3/uL (ref 4.0–10.5)
nRBC: 0 % (ref 0.0–0.2)

## 2022-09-12 LAB — BASIC METABOLIC PANEL
Anion gap: 7 (ref 5–15)
BUN: 31 mg/dL — ABNORMAL HIGH (ref 8–23)
CO2: 24 mmol/L (ref 22–32)
Calcium: 8.7 mg/dL — ABNORMAL LOW (ref 8.9–10.3)
Chloride: 108 mmol/L (ref 98–111)
Creatinine, Ser: 1.86 mg/dL — ABNORMAL HIGH (ref 0.61–1.24)
GFR, Estimated: 40 mL/min — ABNORMAL LOW (ref >60)
Glucose, Bld: 85 mg/dL (ref 70–99)
Potassium: 4.4 mmol/L (ref 3.5–5.1)
Sodium: 139 mmol/L (ref 135–145)

## 2022-09-12 LAB — VITAMIN D 25 HYDROXY (VIT D DEFICIENCY, FRACTURES): Vit D, 25-Hydroxy: 46.41 ng/mL (ref 30–100)

## 2022-09-12 SURGERY — ESOPHAGOGASTRODUODENOSCOPY (EGD) WITH PROPOFOL
Anesthesia: General

## 2022-09-12 MED ORDER — PANTOPRAZOLE SODIUM 20 MG PO TBEC
20.0000 mg | DELAYED_RELEASE_TABLET | Freq: Every day | ORAL | Status: DC
  Administered 2022-09-13: 20 mg via ORAL
  Filled 2022-09-12: qty 1

## 2022-09-12 MED ORDER — PROPOFOL 500 MG/50ML IV EMUL
INTRAVENOUS | Status: DC | PRN
  Administered 2022-09-12: 157.258 ug/kg/min via INTRAVENOUS

## 2022-09-12 MED ORDER — VITAMIN B-12 1000 MCG PO TABS: 500.0000 ug | ORAL_TABLET | Freq: Every day | ORAL | Status: DC

## 2022-09-12 MED ORDER — EPHEDRINE 5 MG/ML INJ
INTRAVENOUS | Status: AC
  Filled 2022-09-12: qty 5

## 2022-09-12 MED ORDER — LIDOCAINE HCL (CARDIAC) PF 100 MG/5ML IV SOSY
PREFILLED_SYRINGE | INTRAVENOUS | Status: DC | PRN
  Administered 2022-09-12: 100 mg via INTRAVENOUS

## 2022-09-12 MED ORDER — VASOPRESSIN 20 UNIT/ML IV SOLN
INTRAVENOUS | Status: DC | PRN
  Administered 2022-09-12: 1 [IU] via INTRAVENOUS

## 2022-09-12 MED ORDER — VASOPRESSIN 20 UNIT/ML IV SOLN
INTRAVENOUS | Status: AC
  Filled 2022-09-12: qty 1

## 2022-09-12 MED ORDER — POLYSACCHARIDE IRON COMPLEX 150 MG PO CAPS: 150.0000 mg | ORAL_CAPSULE | Freq: Every day | ORAL | Status: DC

## 2022-09-12 MED ORDER — PHENYLEPHRINE HCL (PRESSORS) 10 MG/ML IV SOLN
INTRAVENOUS | Status: DC | PRN
  Administered 2022-09-12: 160 ug via INTRAVENOUS
  Administered 2022-09-12: 240 ug via INTRAVENOUS

## 2022-09-12 MED ORDER — PHENYLEPHRINE 80 MCG/ML (10ML) SYRINGE FOR IV PUSH (FOR BLOOD PRESSURE SUPPORT)
PREFILLED_SYRINGE | INTRAVENOUS | Status: AC
  Filled 2022-09-12: qty 10

## 2022-09-12 MED ORDER — CYANOCOBALAMIN 1000 MCG/ML IJ SOLN
1000.0000 ug | Freq: Every day | INTRAMUSCULAR | Status: AC
  Administered 2022-09-12 – 2022-09-13 (×2): 1000 ug via INTRAMUSCULAR
  Filled 2022-09-12 (×2): qty 1

## 2022-09-12 MED ORDER — EPHEDRINE SULFATE (PRESSORS) 50 MG/ML IJ SOLN
INTRAMUSCULAR | Status: DC | PRN
  Administered 2022-09-12: 10 mg via INTRAVENOUS

## 2022-09-12 MED ORDER — VITAMIN C 500 MG PO TABS: 500.0000 mg | ORAL_TABLET | Freq: Every day | ORAL | Status: DC

## 2022-09-12 MED ORDER — SODIUM CHLORIDE 0.9 % IV SOLN: INTRAVENOUS | Status: DC

## 2022-09-12 MED ORDER — PROPOFOL 10 MG/ML IV BOLUS
INTRAVENOUS | Status: DC | PRN
  Administered 2022-09-12: 100 mg via INTRAVENOUS
  Administered 2022-09-12: 30 mg via INTRAVENOUS

## 2022-09-12 MED ORDER — SODIUM CHLORIDE 0.9 % IV SOLN
500.0000 mg | Freq: Every day | INTRAVENOUS | Status: AC
  Administered 2022-09-12 – 2022-09-13 (×2): 500 mg via INTRAVENOUS
  Filled 2022-09-12 (×2): qty 25

## 2022-09-12 NOTE — Op Note (Signed)
[facility] Gastroenterology Patient Name: Kevin Gross Procedure Date: 09/12/2022 11:53 AM MRN: [id number] Account #: [id number] Date of Birth: 03/18/1956 Admit Type: Inpatient Age: 67 Room: [facility] ENDO ROOM 2 Gender: Male Note Status: Finalized Instrument Name: Jasper Riling [id number] Procedure:             Colonoscopy Indications:           Iron deficiency anemia Providers:             Andrey Farmer MD, MD Referring MD:          No Local Md, MD (Referring MD) Medicines:             Monitored Anesthesia Care Complications:         No immediate complications. Procedure:             Pre-Anesthesia Assessment:                        - Prior to the procedure, a History and Physical was                         performed, and patient medications and allergies were                         reviewed. The patient is competent. The risks and                         benefits of the procedure and the sedation options and                         risks were discussed with the patient. All questions                         were answered and informed consent was obtained.                         Patient identification and proposed procedure were                         verified by the physician, the nurse, the                         anesthesiologist, the anesthetist and the technician                         in the endoscopy suite. Mental Status Examination:                         alert and oriented. Airway Examination: normal                         oropharyngeal airway and neck mobility. Respiratory                         Examination: clear to auscultation. CV Examination:                         normal. Prophylactic Antibiotics: The patient does not  require prophylactic antibiotics. Prior                         Anticoagulants: The patient has taken Plavix                         (clopidogrel), last dose was 3 days prior to                          procedure. ASA Grade Assessment: III - A patient with                         severe systemic disease. After reviewing the risks and                         benefits, the patient was deemed in satisfactory                         condition to undergo the procedure. The anesthesia                         plan was to use monitored anesthesia care (MAC).                         Immediately prior to administration of medications,                         the patient was re-assessed for adequacy to receive                         sedatives. The heart rate, respiratory rate, oxygen                         saturations, blood pressure, adequacy of pulmonary                         ventilation, and response to care were monitored                         throughout the procedure. The physical status of the                         patient was re-assessed after the procedure.                        After obtaining informed consent, the colonoscope was                         passed under direct vision. Throughout the procedure,                         the patient's blood pressure, pulse, and oxygen                         saturations were monitored continuously. The                         Colonoscope was introduced through the anus and  advanced to the the terminal ileum. The colonoscopy                         was performed without difficulty. The patient                         tolerated the procedure well. The quality of the bowel                         preparation was fair. The terminal ileum, ileocecal                         valve, appendiceal orifice, and rectum were                         photographed. Findings:      The perianal and digital rectal examinations were normal.      The terminal ileum appeared normal.      Multiple large-mouthed and small-mouthed diverticula were found in the       hepatic flexure and ascending colon.      Internal hemorrhoids were found  during retroflexion. The hemorrhoids       were Grade I (internal hemorrhoids that do not prolapse).      The exam was otherwise without abnormality on direct and retroflexion       views. Impression:            - Preparation of the colon was fair.                        - The examined portion of the ileum was normal.                        - Diverticulosis at the hepatic flexure and in the                         ascending colon.                        - Internal hemorrhoids.                        - The examination was otherwise normal on direct and                         retroflexion views.                        - No specimens collected. Recommendation:        - Return patient to hospital ward for ongoing care.                        - Advance diet as tolerated.                        - Continue present medications. Procedure Code(s):     --- Professional ---                        [phone], Colonoscopy, flexible; diagnostic, including  collection of specimen(s) by brushing or washing, when                         performed (separate procedure) Diagnosis Code(s):     --- Professional ---                        K64.0, First degree hemorrhoids                        D50.9, Iron deficiency anemia, unspecified                        K57.30, Diverticulosis of large intestine without                         perforation or abscess without bleeding CPT copyright 2022 American Medical Association. All rights reserved. The codes documented in this report are preliminary and upon coder review may  be revised to meet current compliance requirements. Andrey Farmer MD, MD 09/12/2022 1:03:04 PM Number of Addenda: 0 Note Initiated On: 09/12/2022 11:53 AM Scope Withdrawal Time: 0 hours 5 minutes 53 seconds  Total Procedure Duration: 0 hours 9 minutes 48 seconds  Estimated Blood Loss:  Estimated blood loss: none.      [facility]

## 2022-09-12 NOTE — Transfer of Care (Signed)
Immediate Anesthesia Transfer of Care Note  Patient: Kevin Gross  Procedure(s) Performed: ESOPHAGOGASTRODUODENOSCOPY (EGD) WITH PROPOFOL COLONOSCOPY WITH PROPOFOL  Patient Location: Endoscopy Unit  Anesthesia Type:General  Level of Consciousness: drowsy  Airway & Oxygen Therapy: Patient Spontanous Breathing  Post-op Assessment: Report given to RN and Post -op Vital signs reviewed and stable  Post vital signs: Reviewed and stable  Last Vitals:  Vitals Value Taken Time  BP 101/55 09/12/22 1304  Temp 36.2 C 09/12/22 1300  Pulse 70 09/12/22 1310  Resp 12 09/12/22 1310  SpO2 95 % 09/12/22 1310  Vitals shown include unvalidated device data.  Last Pain:  Vitals:   09/12/22 1300  TempSrc: Temporal  PainSc: Asleep         Complications: No notable events documented.

## 2022-09-12 NOTE — Op Note (Signed)
[facility] Gastroenterology Patient Name: Kevin Gross Procedure Date: 09/12/2022 11:54 AM MRN: [id number] Account #: [id number] Date of Birth: 07-12-1956 Admit Type: Inpatient Age: 67 Room: [facility] ENDO ROOM 2 Gender: Male Note Status: Finalized Instrument Name: Upper Endoscope [id number] Procedure:             Upper GI endoscopy Indications:           Iron deficiency anemia Providers:             Andrey Farmer MD, MD Referring MD:          No Local Md, MD (Referring MD) Medicines:             Monitored Anesthesia Care Complications:         No immediate complications. Estimated blood loss:                         Minimal. Procedure:             Pre-Anesthesia Assessment:                        - Prior to the procedure, a History and Physical was                         performed, and patient medications and allergies were                         reviewed. The patient is competent. The risks and                         benefits of the procedure and the sedation options and                         risks were discussed with the patient. All questions                         were answered and informed consent was obtained.                         Patient identification and proposed procedure were                         verified by the physician, the nurse, the                         anesthesiologist, the anesthetist and the technician                         in the endoscopy suite. Mental Status Examination:                         alert and oriented. Airway Examination: normal                         oropharyngeal airway and neck mobility. Respiratory                         Examination: clear to auscultation. CV Examination:  normal. Prophylactic Antibiotics: The patient does not                         require prophylactic antibiotics. Prior                         Anticoagulants: The patient has taken Plavix                          (clopidogrel), last dose was 3 days prior to                         procedure. ASA Grade Assessment: III - A patient with                         severe systemic disease. After reviewing the risks and                         benefits, the patient was deemed in satisfactory                         condition to undergo the procedure. The anesthesia                         plan was to use monitored anesthesia care (MAC).                         Immediately prior to administration of medications,                         the patient was re-assessed for adequacy to receive                         sedatives. The heart rate, respiratory rate, oxygen                         saturations, blood pressure, adequacy of pulmonary                         ventilation, and response to care were monitored                         throughout the procedure. The physical status of the                         patient was re-assessed after the procedure.                        After obtaining informed consent, the endoscope was                         passed under direct vision. Throughout the procedure,                         the patient's blood pressure, pulse, and oxygen                         saturations were monitored continuously. The Endoscope  was introduced through the mouth, and advanced to the                         second part of duodenum. The upper GI endoscopy was                         accomplished without difficulty. The patient tolerated                         the procedure well. Findings:      The Z-line was irregular.      The exam of the esophagus was otherwise normal.      Multiple dispersed small erosions with no bleeding and no stigmata of       recent bleeding were found in the gastric antrum. Biopsies were taken       with a cold forceps for histology. Estimated blood loss was minimal.      The exam of the stomach was otherwise normal.      The examined  duodenum was normal. Impression:            - Z-line irregular.                        - Erosive gastropathy with no bleeding and no stigmata                         of recent bleeding. Biopsied.                        - Normal examined duodenum. Recommendation:        - Await pathology results.                        - Perform a colonoscopy today. Procedure Code(s):     --- Professional ---                        [phone], Esophagogastroduodenoscopy, flexible,                         transoral; with biopsy, single or multiple Diagnosis Code(s):     --- Professional ---                        K22.89, Other specified disease of esophagus                        K31.89, Other diseases of stomach and duodenum                        D50.9, Iron deficiency anemia, unspecified CPT copyright 2022 American Medical Association. All rights reserved. The codes documented in this report are preliminary and upon coder review may  be revised to meet current compliance requirements. Andrey Farmer MD, MD 09/12/2022 12:58:07 PM Number of Addenda: 0 Note Initiated On: 09/12/2022 11:54 AM Estimated Blood Loss:  Estimated blood loss was minimal.      [facility]

## 2022-09-12 NOTE — Anesthesia Preprocedure Evaluation (Addendum)
Anesthesia Evaluation  Patient identified by MRN, date of birth, ID band Patient awake    Reviewed: Allergy & Precautions, NPO status , Patient's Chart, lab work & pertinent test results  History of Anesthesia Complications Negative for: history of anesthetic complications  Airway Mallampati: III   Neck ROM: Full    Dental  (+) Missing   Pulmonary sleep apnea and Continuous Positive Airway Pressure Ventilation , COPD, former smoker (quit 2016)   Pulmonary exam normal breath sounds clear to auscultation       Cardiovascular hypertension, + CAD and + Peripheral Vascular Disease (on Plavix)  Normal cardiovascular exam Rhythm:Regular Rate:Normal  ECG 09/09/22:  Atrial fibrillation Low voltage, extremity and precordial leads   Neuro/Psych Hx alcohol use disorder, last use several years ago    GI/Hepatic negative GI ROS,,,(+) Cirrhosis         Endo/Other  Obesity; prediabetes  Renal/GU Renal disease (AKI on stage III CKD)     Musculoskeletal   Abdominal   Peds  Hematology  (+) Blood dyscrasia (Baseline hemoglobin is 7.3-8.3), anemia   Anesthesia Other Findings   Reproductive/Obstetrics                             Anesthesia Physical Anesthesia Plan  ASA: 4  Anesthesia Plan: General   Post-op Pain Management:    Induction: Intravenous  PONV Risk Score and Plan: 2 and Propofol infusion, TIVA and Treatment may vary due to age or medical condition  Airway Management Planned: Natural Airway  Additional Equipment:   Intra-op Plan:   Post-operative Plan:   Informed Consent: I have reviewed the patients History and Physical, chart, labs and discussed the procedure including the risks, benefits and alternatives for the proposed anesthesia with the patient or authorized representative who has indicated his/her understanding and acceptance.       Plan Discussed with:  CRNA  Anesthesia Plan Comments: (LMA/GETA backup discussed.  Patient consented for risks of anesthesia including but not limited to:  - adverse reactions to medications - damage to eyes, teeth, lips or other oral mucosa - nerve damage due to positioning  - sore throat or hoarseness - damage to heart, brain, nerves, lungs, other parts of body or loss of life  Informed patient about role of CRNA in peri- and intra-operative care.  Patient voiced understanding.)        Anesthesia Quick Evaluation

## 2022-09-12 NOTE — Care Plan (Signed)
No significant source of GI bleeding found on EGD/Colonoscopy. Some small erosions in the stomach that were biopsied. No overt GI bleeding. Ok to resume plavix tomorrow and will need outpatient f/u to discuss potential VCE.  - added PPI - ok for diet - resume plavix tomorrow - outpatient GI f/u  Please call with any questions or concerns  Raylene Miyamoto MD, MPH [place]

## 2022-09-12 NOTE — Progress Notes (Signed)
[facility] Progress Note  Patient: Kevin Gross    HUT:[id number]  DOA: 09/09/2022     Date of Service: the patient was seen and examined on 09/12/2022  Chief Complaint  Patient presents with   Weakness   Brief hospital course: Mr. Jyles Sontag is a 67 year old male with history of PAD status post bilateral endarterectomy with vein patch angioplasty, stent placement and left common iliac artery, left external iliac artery stent placement, history of alcoholic liver cirrhosis, heart failure, clotting disorder, hyperlipidemia, hypertension, sleep apnea, who presents emergency department for chief concerns of weakness.   Initial vitals in the emergency department showed temperature of 98, respiration rate of 20, heart rate of 66, blood pressure 119/56, SpO2 98% on room air.   Serum sodium is 143, potassium 4.6, chloride 108, bicarb 23, BUN of 36, serum creatinine of 2.25, EGFR 32, nonfasting blood glucose 115, WBC 6.1, hemoglobin 5.7, platelets 283.   UA was negative for leukocytes and nitrites.   ED treatment: Anemia panel ordered, type and screen   1/13: GI consult   Assessment and Plan: Symptomatic anemia -Baseline hemoglobin is 7.3-8.3, based on values and April 2023 -anemia panel does show low iron levels, Differentials include chronic CKD versus iron deficiency  -Status post 2 units of PRBC transfusion on admission -GI planning EGD/colonoscopy Today  to rule out any large lesions -Hold Plavix.  H&H 7.5/26.3.  Continue to monitor closely and transfuse if less than 7 - will need genetics referral as an outpatient due to history of > 20 polyps per GI 1/15 Hb 7.7 today   # Iron deficiency, iron level 19, transferrin saturation 4%, started Venofer 500 mg IV daily x 2 doses followed by oral supplement.  Follow with PCP to repeat iron profile after 3 to 6 months. # Vitamin B12 level 283, goal >400, started vitamin B12 IM injection during hospital stay followed by oral  supplement.  Follow with PCP to repeat vitamin B12 level after 3 to 6 months.   Acute kidney injury superimposed on chronic kidney disease ([place]) -Presumed secondary to prerenal secondary to iron deficiency anemia -Treat per anemia with blood transfusion -Avoid nephrotoxic medications   Essential hypertension -lisinopril 10 mg, chlorthalidone 25 every morning, not resumed on admission due to acute kidney injury; will resume when appropriate -Labetalol 5 mg IV every 3 hours as needed for SBP > 175, 4 doses ordered -BP remained stable     COPD (chronic obstructive pulmonary disease) ([place]) -Not in exacerbation.  No wheezing noted on exam.  On room air -Continue albuterol, 1 to 2 puff inhalation every 6 hours as needed for wheezing and shortness of breath   PAD (peripheral artery disease) (HCC) -Plavix 75 mg daily, aspirin 81 mg daily has not been resumed on admission due to symptomatic anemia.  -Plan for EGD/colonoscopy on Monday so will need Plavix washout as well     PAF (paroxysmal atrial fibrillation) (HCC) -Continue amiodarone 200 mg p.o. twice daily.  Rate well-controlled.   CAD (coronary artery disease), native coronary artery -Aspirin 81 mg daily, clopidogrel 75 mg daily not resumed on admission due to concern for GI bleed -Patient denies any chest pain   Body mass index is 39.22 kg/m.  Interventions:       Diet: N.p.o. since midnight for E/C DVT Prophylaxis: SCD, pharmacological prophylaxis contraindicated due to symptomatic anemia    Advance goals of care discussion: Full code  Family Communication: family was not present at bedside, at the time of  interview.  The pt provided permission to discuss medical plan with the family. Opportunity was given to ask question and all questions were answered satisfactorily.   Disposition:  Pt is from home, admitted with symptomatic anemia, plan for EGD and colonoscopy today, repeat CBC tomorrow a.m., which precludes a safe  discharge. Discharge to Home, when cleared by GI, most likely tomorrow a.m. if remains stable.  Subjective: No significant events overnight, patient denies any chest pain or palpitation, no shortness of breath, no nausea vomiting or diarrhea.  No GI bleeding.  Physical Exam: General: NAD, lying comfortably Appear in no distress, affect appropriate Eyes: PERRLA ENT: Oral Mucosa Clear, moist  Neck: no JVD,  Cardiovascular: S1 and S2 Present, no Murmur,  Respiratory: good respiratory effort, Bilateral Air entry equal and Decreased, no Crackles, no wheezes Abdomen: Bowel Sound present, Soft and no tenderness,  Skin: no rashes Extremities: no Pedal edema, no calf tenderness Neurologic: without any new focal findings Gait not checked due to patient safety concerns  Vitals:   09/11/22 1545 09/11/22 1939 09/12/22 0427 09/12/22 1141  BP: 130/66 130/66 (!) 122/45 (!) 137/93  Pulse: 66 64 60 65  Resp: '18 18 18 18  '$ Temp: 97.7 F (36.5 C) 97.7 F (36.5 C) 98.4 F (36.9 C) (!) 97.5 F (36.4 C)  TempSrc: Oral Oral Oral Temporal  SpO2: 100% 100% 98% 100%  Weight:    124 kg  Height:    '5\' 10"'$  (1.778 m)    Intake/Output Summary (Last 24 hours) at 09/12/2022 1221 Last data filed at 09/11/2022 1404 Gross per 24 hour  Intake 480 ml  Output --  Net 480 ml   Filed Weights   09/09/22 1524 09/12/22 1141  Weight: 124 kg 124 kg    Data Reviewed: I have personally reviewed and interpreted daily labs, tele strips, imagings as discussed above. I reviewed all nursing notes, pharmacy notes, vitals, pertinent old records I have discussed plan of care as described above with RN and patient/family.  CBC: Recent Labs  Lab 09/09/22 1511 09/09/22 2213 09/10/22 0551 09/11/22 0621 09/12/22 0418  WBC 6.1  --  6.9 6.6 6.7  HGB 5.7* 7.3* 7.7* 7.5* 7.7*  HCT 22.0* 26.5* 27.3* 26.3* 26.5*  MCV 80.6  --  81.3 79.9* 79.6*  PLT 283  --  267 246 128   Basic Metabolic Panel: Recent Labs  Lab  09/09/22 1511 09/10/22 0551 09/11/22 0621 09/12/22 0418  NA 141 138 136 139  K 4.6 4.2 3.9 4.4  CL 108 109 107 108  CO2 '23 22 22 24  '$ GLUCOSE 115* 87 88 85  BUN 36* 29* 32* 31*  CREATININE 2.25* 1.85* 1.88* 1.86*  CALCIUM 9.0 8.6* 8.6* 8.7*    Studies: No results found.  Scheduled Meds:  [MAR Hold] sodium chloride   Intravenous Once   [MAR Hold] allopurinol  300 mg Oral BH-q7a   [MAR Hold] amiodarone  200 mg Oral BID   [MAR Hold] vitamin C  500 mg Oral Daily   [MAR Hold] cyanocobalamin  1,000 mcg Intramuscular Daily   Followed by   [NOM Hold] vitamin B-12  500 mcg Oral Daily   [MAR Hold] iron polysaccharides  150 mg Oral Daily   [MAR Hold] simvastatin  20 mg Oral QPM   [MAR Hold] tamsulosin  0.4 mg Oral QPC supper   Continuous Infusions:  sodium chloride 20 mL/hr at 09/12/22 1146   [MAR Hold] iron sucrose     PRN Meds: [MAR Hold] acetaminophen **  OR** [MAR Hold] acetaminophen, [MAR Hold] albuterol, [MAR Hold] docusate sodium, [MAR Hold] labetalol, [MAR Hold] ondansetron **OR** [MAR Hold] ondansetron (ZOFRAN) IV, [MAR Hold] senna-docusate, [MAR Hold] zolpidem  Time spent: 35 minutes  Author: Val Riles. MD [facility] 09/12/2022 12:21 PM  To reach On-call, see care teams to locate the attending and reach out to them via [email or web address]. If 7PM-7AM, please contact night-coverage If you still have difficulty reaching the attending provider, please page the [facility] (Director on Call) for [facility] on amion for assistance.

## 2022-09-12 NOTE — Anesthesia Postprocedure Evaluation (Signed)
Anesthesia Post Note  Patient: Kevin Gross  Procedure(s) Performed: ESOPHAGOGASTRODUODENOSCOPY (EGD) WITH PROPOFOL COLONOSCOPY WITH PROPOFOL  Patient location during evaluation: PACU Anesthesia Type: General Level of consciousness: awake and alert, oriented and patient cooperative Pain management: pain level controlled Vital Signs Assessment: post-procedure vital signs reviewed and stable Respiratory status: spontaneous breathing, nonlabored ventilation and respiratory function stable Cardiovascular status: blood pressure returned to baseline and stable Postop Assessment: adequate PO intake Anesthetic complications: no   No notable events documented.   Last Vitals:  Vitals:   09/12/22 1300 09/12/22 1353  BP: (!) 101/55 (!) 102/44  Pulse: 64   Resp:    Temp: (!) 36.2 C 36.7 C  SpO2: 96% 96%    Last Pain:  Vitals:   09/12/22 1353  TempSrc: Oral  PainSc:                  Darrin Nipper

## 2022-09-12 NOTE — Care Management Important Message (Signed)
Important Message  Patient Details  Name: [name] MRN: [id number] Date of Birth: 1956-04-27   Medicare Important Message Given:  Yes     Dannette Barbara 09/12/2022, 12:00 PM

## 2022-09-13 ENCOUNTER — Encounter: Payer: Self-pay | Admitting: Gastroenterology

## 2022-09-13 DIAGNOSIS — D649 Anemia, unspecified: Secondary | ICD-10-CM | POA: Diagnosis not present

## 2022-09-13 LAB — BASIC METABOLIC PANEL
Anion gap: 7 (ref 5–15)
BUN: 30 mg/dL — ABNORMAL HIGH (ref 8–23)
CO2: 25 mmol/L (ref 22–32)
Calcium: 8.9 mg/dL (ref 8.9–10.3)
Chloride: 106 mmol/L (ref 98–111)
Creatinine, Ser: 1.96 mg/dL — ABNORMAL HIGH (ref 0.61–1.24)
GFR, Estimated: 37 mL/min — ABNORMAL LOW (ref >60)
Glucose, Bld: 92 mg/dL (ref 70–99)
Potassium: 4.5 mmol/L (ref 3.5–5.1)
Sodium: 138 mmol/L (ref 135–145)

## 2022-09-13 LAB — CBC
HCT: 27.4 % — ABNORMAL LOW (ref 39.0–52.0)
Hemoglobin: 7.8 g/dL — ABNORMAL LOW (ref 13.0–17.0)
MCH: 22.9 pg — ABNORMAL LOW (ref 26.0–34.0)
MCHC: 28.5 g/dL — ABNORMAL LOW (ref 30.0–36.0)
MCV: 80.4 fL (ref 80.0–100.0)
Platelets: 246 10*3/uL (ref 150–400)
RBC: 3.41 MIL/uL — ABNORMAL LOW (ref 4.22–5.81)
RDW: 21.3 % — ABNORMAL HIGH (ref 11.5–15.5)
WBC: 6.5 10*3/uL (ref 4.0–10.5)
nRBC: 0 % (ref 0.0–0.2)

## 2022-09-13 LAB — SURGICAL PATHOLOGY

## 2022-09-13 LAB — PHOSPHORUS: Phosphorus: 4.2 mg/dL (ref 2.5–4.6)

## 2022-09-13 LAB — MAGNESIUM: Magnesium: 2.2 mg/dL (ref 1.7–2.4)

## 2022-09-13 MED ORDER — CLOPIDOGREL BISULFATE 75 MG PO TABS
75.0000 mg | ORAL_TABLET | Freq: Every day | ORAL | Status: DC
  Administered 2022-09-13: 75 mg via ORAL
  Filled 2022-09-13: qty 1

## 2022-09-13 MED ORDER — PANTOPRAZOLE SODIUM 20 MG PO TBEC: 20.0000 mg | DELAYED_RELEASE_TABLET | Freq: Every day | ORAL | 2 refills | Status: DC

## 2022-09-13 MED ORDER — CYANOCOBALAMIN 500 MCG PO TABS: 500.0000 ug | ORAL_TABLET | Freq: Every day | ORAL | 0 refills | Status: AC

## 2022-09-13 MED ORDER — LISINOPRIL 10 MG PO TABS
10.0000 mg | ORAL_TABLET | Freq: Every day | ORAL | Status: DC
  Administered 2022-09-13: 10 mg via ORAL
  Filled 2022-09-13: qty 1

## 2022-09-13 MED ORDER — POLYSACCHARIDE IRON COMPLEX 150 MG PO CAPS: 150.0000 mg | ORAL_CAPSULE | Freq: Every day | ORAL | 0 refills | Status: DC

## 2022-09-13 MED ORDER — ASPIRIN 81 MG PO TBEC
81.0000 mg | DELAYED_RELEASE_TABLET | ORAL | Status: DC
  Administered 2022-09-13: 81 mg via ORAL
  Filled 2022-09-13: qty 1

## 2022-09-13 MED ORDER — ASCORBIC ACID 500 MG PO TABS: 500.0000 mg | ORAL_TABLET | Freq: Every day | ORAL | 0 refills | Status: AC

## 2022-09-13 NOTE — TOC Initial Note (Signed)
Transition of Care [facility]) - Initial/Assessment Note    Patient Details  Name: Kevin Gross MRN: [id number] Date of Birth: Aug 19, 1956  Transition of Care [facility]) CM/SW Contact:    Beverly Sessions, RN Phone Number: 09/13/2022, 11:09 AM  Clinical Narrative:                    Admitted URK:[id number] anemia  Admitted from:home with wife PCP: August Luz Pharmacy: [facility] Current home health/prior home health/DME: NA Patient states that weakness has improved and does not feel that home health will be indicated at discharge  Patient states that his daughters significant other will be transporting at discharge       Patient Goals and CMS Choice            Expected Discharge Plan and Services                                              Prior Living Arrangements/Services                       Activities of Daily Living Home Assistive Devices/Equipment: None ADL Screening (condition at time of admission) Is the patient deaf or have difficulty hearing?: No Does the patient have difficulty seeing, even when wearing glasses/contacts?: No Does the patient have difficulty concentrating, remembering, or making decisions?: No Does the patient have difficulty dressing or bathing?: No Does the patient have difficulty walking or climbing stairs?: No  Permission Sought/Granted                  Emotional Assessment              Admission diagnosis:  Symptomatic anemia [D64.9] Patient Active Problem List   Diagnosis Date Noted   Symptomatic anemia 09/09/2022   Anemia 15/17/6160   Alcoholic cirrhosis of liver without ascites ([place]) 12/13/2021   Pre-diabetes 12/13/2021   OSA (obstructive sleep apnea) 12/13/2021   Essential hypertension 12/13/2021   Wound infection after surgery 12/12/2021   PAF (paroxysmal atrial fibrillation) ([place])    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 ([place])  11/26/2021   PAD (peripheral artery disease) ([place]) 11/26/2021   Chronic gouty arthritis 04/16/2019   Encounter for long-term (current) use of high-risk medication 04/16/2019   Stage 3b chronic kidney disease (CKD) ([place]) 03/05/2019   Hyperuricemia 03/05/2019   Cellulitis of left lower extremity 02/18/2019   Aortic atherosclerosis ([place]) 04/10/2018   Class 3 obesity ([place]) 04/10/2018   CAD (coronary artery disease), native coronary artery 04/08/2018   COPD (chronic obstructive pulmonary disease) ([place]) 04/08/2018   Acute kidney injury superimposed on chronic kidney disease ([place]) 04/08/2018   Hx of adenomatous colonic polyps 08/29/2016   PCP:  Rutherford Limerick, PA Pharmacy:   [facility] [phone] [address], [place] AT [facility] OF [address] [place] [address] Phone: [phone] Fax: [phone]     Social Determinants of Health (SDOH) Social History: SDOH Screenings   Tobacco Use: Medium Risk (09/12/2022)   SDOH Interventions:     Readmission Risk Interventions    09/13/2022   11:09 AM 12/24/2021   12:03 PM 12/06/2021   10:01 AM  Readmission Risk Prevention Plan  Transportation Screening Complete Complete Complete  PCP or Specialist Appt  within 5-7 Days  Complete Complete  Home Care Screening Patient refused Complete Complete  Medication Review (RN CM) Complete Complete Complete

## 2022-09-13 NOTE — Progress Notes (Signed)
Kevin Gross to be discharged Home per MD order. Discussed prescriptions and follow up appointments with the patient. Prescriptions given to patient, medication list explained in detail. Patient verbalized understanding.  Allergies as of 09/13/2022   No Known Allergies      Medication List     TAKE these medications    acetaminophen 325 MG tablet Commonly known as: TYLENOL Take 2 tablets (650 mg total) by mouth every 6 (six) hours as needed for mild pain (or Fever >/= 101).   ascorbic acid 500 MG tablet Commonly known as: VITAMIN C Take 1 tablet (500 mg total) by mouth daily. Start taking on: September 14, 2022   aspirin EC 81 MG tablet Take 81 mg by mouth every morning.   chlorthalidone 25 MG tablet Commonly known as: HYGROTON Take 25 mg by mouth daily.   clopidogrel 75 MG tablet Commonly known as: PLAVIX Take 1 tablet (75 mg total) by mouth daily.   cyanocobalamin 500 MCG tablet Commonly known as: VITAMIN B12 Take 1 tablet (500 mcg total) by mouth daily. Start taking on: September 14, 2022   ezetimibe 10 MG tablet Commonly known as: ZETIA Take 1 tablet (10 mg total) by mouth daily.   iron polysaccharides 150 MG capsule Commonly known as: NIFEREX Take 1 capsule (150 mg total) by mouth daily. Start taking on: September 14, 2022   lisinopril 10 MG tablet Commonly known as: ZESTRIL Take 10 mg by mouth daily.   pantoprazole 20 MG tablet Commonly known as: PROTONIX Take 1 tablet (20 mg total) by mouth daily. Start taking on: September 14, 2022   simvastatin 20 MG tablet Commonly known as: ZOCOR Take 20 mg by mouth in the morning.   zolpidem 5 MG tablet Commonly known as: AMBIEN Take 5 mg by mouth at bedtime.        Vitals:   09/13/22 0737 09/13/22 1635  BP: (!) 143/59 (!) 125/56  Pulse: 71 (!) 58  Resp: 20 18  Temp: 97.6 F (36.4 C) 98.5 F (36.9 C)  SpO2: 98% 98%    Skin clean, dry and intact without evidence of skin break down and or skin tears.  IV catheter discontinued intact. Site without signs and symptoms of complications. Dressing and pressure applied. Patient denies pain at this time. No complaints noted.  An After Visit Summary was printed and given to the patient. Patient escorted via wheelchair and discharged Home home via private auto.  Fuller Mandril, RN

## 2022-09-13 NOTE — Discharge Summary (Signed)
[facility] Discharge Summary   Patient: Kevin Gross ZOX:[id number]  PCP: Rutherford Limerick, PA  Date of admission: 09/09/2022   Date of discharge:  09/13/2022     Discharge Diagnoses:  Principal Problem:   Symptomatic anemia Active Problems:   Acute kidney injury superimposed on chronic kidney disease (HCC)   Essential hypertension   COPD (chronic obstructive pulmonary disease) (HCC)   PAD (peripheral artery disease) (HCC)   PAF (paroxysmal atrial fibrillation) (HCC)   CAD (coronary artery disease), native coronary artery   Stage 3b chronic kidney disease (CKD) ([place])   Admitted From: Home Disposition:  Home   Recommendations for Outpatient Follow-up:  Follow-up with PCP in 1 week, repeat CBC to check hemoglobin after 1 week. Follow-up with GI in 1 week for biopsy report and VCE to find occult GI bleeding Follow-up with PCP for lung cancer screening. Follow-up with nephrology in 1 to 2 weeks for CKD stage IIIb, patient may need Epogen as an outpatient Follow up LABS/TEST: CBC in 1 week   Diet recommendation: Cardiac diet  Activity: The patient is advised to gradually reintroduce usual activities, as tolerated  Discharge Condition: stable  Code Status: Full code   History of present illness: As per the H and P dictated on admission Hospital Course:  [name] is a 67 year old male with history of PAD status post bilateral endarterectomy with vein patch angioplasty, stent placement and left common iliac artery, left external iliac artery stent placement, history of alcoholic liver cirrhosis, heart failure, clotting disorder, hyperlipidemia, hypertension, sleep apnea, who presents emergency department for chief concerns of weakness. Initial vitals in the ED showed temperature of 98, respiration rate of 20, heart rate of 66, blood pressure 119/56, SpO2 98% on room air. Labs: Na 143, K 4.6, chloride 108, bicarb 23, BUN of 36, serum creatinine of 2.25, EGFR 32,  nonfasting blood glucose 115, WBC 6.1, hemoglobin 5.7, platelets 283. UA was negative for leukocytes and nitrites. ED treatment: Anemia panel ordered, type and screen 1/13: GI consult, s/p EGD and colonoscopy   Assessment and Plan: Symptomatic anemia Baseline hemoglobin is 7.3-8.3, based on values and April 2023 anemia panel does show low iron levels, Differentials include chronic CKD versus iron deficiency. S/p 2 units of PRBC transfusion on admission.  GI was consulted, s/p EGD and colonoscopy. No significant source of GI bleeding found. Some small erosions in the stomach that were biopsied. No overt GI bleeding. Ok to resume plavix tomorrow and will need outpatient f/u to discuss potential VC Hb 7.8, stable, patient was discharged home and recommended to follow-up with GI.  Repeat CBC after 1 week, follow with PCP. # Iron deficiency, iron level 19, transferrin saturation 4%, started Venofer 500 mg IV daily x 2 doses followed by oral supplement.  Follow with PCP to repeat iron profile after 3 to 6 months. # Vitamin B12 level 283, goal >400, started vitamin B12 IM injection during hospital stay followed by oral supplement.  Follow with PCP to repeat vitamin B12 level after 3 to 6 months. # chronic kidney disease stage IIIb, patient is following nephrologist, recommended to continue to follow for further management as an outpatient.  Continued home medications. # Essential hypertension, Resumed home medications lisinopril 10 mg, chlorthalidone 25 mg pod.  Recommended to monitor BP at home and follow with PCP and nephrology as an outpatient. # COPD (chronic obstructive pulmonary disease) Not in exacerbation.  No wheezing noted on exam.  On room air.  # PAD (  peripheral artery disease), Resumed Plavix 75 mg daily, aspirin 81 mg daily after clearance from the GI.  # PAF (paroxysmal atrial fibrillation), Continue amiodarone 200 mg p.o. twice daily. # CAD (coronary artery disease), native coronary artery,  Resumed Aspirin 81 mg daily, clopidogrel 75 mg daily on discharge, Patient denies any chest pain Body mass index is 39.22 kg/m.  Interventions:  Patient was ambulatory without any assistance. On the day of the discharge the patient's vitals were stable, and no other acute medical condition were reported by patient. the patient was felt safe to be discharge at Home .  Consultants: GI Procedures: s/p EGD and colonoscopy  Discharge Exam: General: Appear in no distress, no Rash; Oral Mucosa Clear, moist. Cardiovascular: S1 and S2 Present, no Murmur, Respiratory: normal respiratory effort, Bilateral Air entry present and no Crackles, no wheezes Abdomen: Bowel Sound present, Soft and no tenderness, no hernia Extremities: no Pedal edema, no calf tenderness Neurology: alert and oriented to time, place, and person affect appropriate.  Filed Weights   09/09/22 1524 09/12/22 1141  Weight: 124 kg 124 kg   Vitals:   09/13/22 0522 09/13/22 0737  BP: 130/68 (!) 143/59  Pulse: 63 71  Resp: 18 20  Temp: 98.9 F (37.2 C) 97.6 F (36.4 C)  SpO2: 97% 98%    DISCHARGE MEDICATION: Allergies as of 09/13/2022   No Known Allergies      Medication List     TAKE these medications    acetaminophen 325 MG tablet Commonly known as: TYLENOL Take 2 tablets (650 mg total) by mouth every 6 (six) hours as needed for mild pain (or Fever >/= 101).   ascorbic acid 500 MG tablet Commonly known as: VITAMIN C Take 1 tablet (500 mg total) by mouth daily. Start taking on: September 14, 2022   aspirin EC 81 MG tablet Take 81 mg by mouth every morning.   chlorthalidone 25 MG tablet Commonly known as: HYGROTON Take 25 mg by mouth daily.   clopidogrel 75 MG tablet Commonly known as: PLAVIX Take 1 tablet (75 mg total) by mouth daily.   cyanocobalamin 500 MCG tablet Commonly known as: VITAMIN B12 Take 1 tablet (500 mcg total) by mouth daily. Start taking on: September 14, 2022   ezetimibe 10 MG  tablet Commonly known as: ZETIA Take 1 tablet (10 mg total) by mouth daily.   iron polysaccharides 150 MG capsule Commonly known as: NIFEREX Take 1 capsule (150 mg total) by mouth daily. Start taking on: September 14, 2022   lisinopril 10 MG tablet Commonly known as: ZESTRIL Take 10 mg by mouth daily.   pantoprazole 20 MG tablet Commonly known as: PROTONIX Take 1 tablet (20 mg total) by mouth daily. Start taking on: September 14, 2022   simvastatin 20 MG tablet Commonly known as: ZOCOR Take 20 mg by mouth in the morning.   zolpidem 5 MG tablet Commonly known as: AMBIEN Take 5 mg by mouth at bedtime.       No Known Allergies Discharge Instructions     Call MD for:  difficulty breathing, headache or visual disturbances   Complete by: As directed    Call MD for:  extreme fatigue   Complete by: As directed    Call MD for:  persistant dizziness or light-headedness   Complete by: As directed    Call MD for:  persistant nausea and vomiting   Complete by: As directed    Call MD for:  severe uncontrolled pain   Complete  by: As directed    Call MD for:  temperature >100.4   Complete by: As directed    Diet - low sodium heart healthy   Complete by: As directed    Discharge instructions   Complete by: As directed    Follow-up with PCP in 1 week, repeat CBC to check hemoglobin after 1 week. Follow-up with GI in 1 week for biopsy report and VCE to find occult GI bleeding Follow-up with PCP for lung cancer screening. Follow-up with nephrology in 1 to 2 weeks for CKD stage IIIb, patient may need Epogen as an outpatient   Increase activity slowly   Complete by: As directed        The results of significant diagnostics from this hospitalization (including imaging, microbiology, ancillary and laboratory) are listed below for reference.    Significant Diagnostic Studies: DG Chest Portable 1 View  Result Date: 09/09/2022 CLINICAL DATA:  Weakness.  Evaluate for edema. EXAM:  PORTABLE CHEST 1 VIEW COMPARISON:  Chest radiograph 11/27/2021. FINDINGS: 1606 hours. Well-expanded lungs with mild pulmonary vascular prominence. No focal airspace opacities. Indistinctness of the left costophrenic sulcus may reflect trace pleural effusion. Normal heart size and mediastinal contours. Calcifications of the aortic arch. No acute bony abnormalities. Visualized portions of the upper abdomen are unremarkable. IMPRESSION: Possible mild pulmonary edema with question of trace left pleural effusion. Electronically Signed   By: Emmit Alexanders M.D   On: 09/09/2022 16:23    Microbiology: No results found for this or any previous visit (from the past 240 hour(s)).   Labs: CBC: Recent Labs  Lab 09/09/22 1511 09/09/22 2213 09/10/22 0551 09/11/22 0621 09/12/22 0418 09/13/22 0450  WBC 6.1  --  6.9 6.6 6.7 6.5  HGB 5.7* 7.3* 7.7* 7.5* 7.7* 7.8*  HCT 22.0* 26.5* 27.3* 26.3* 26.5* 27.4*  MCV 80.6  --  81.3 79.9* 79.6* 80.4  PLT 283  --  267 246 241 601   Basic Metabolic Panel: Recent Labs  Lab 09/09/22 1511 09/10/22 0551 09/11/22 0621 09/12/22 0418 09/13/22 0450  NA 141 138 136 139 138  K 4.6 4.2 3.9 4.4 4.5  CL 108 109 107 108 106  CO2 '23 22 22 24 25  '$ GLUCOSE 115* 87 88 85 92  BUN 36* 29* 32* 31* 30*  CREATININE 2.25* 1.85* 1.88* 1.86* 1.96*  CALCIUM 9.0 8.6* 8.6* 8.7* 8.9  MG  --   --   --   --  2.2  PHOS  --   --   --   --  4.2   Liver Function Tests: Recent Labs  Lab 09/09/22 1511  AST 17  ALT 14  ALKPHOS 46  BILITOT 0.5  PROT 7.0  ALBUMIN 3.8   No results for input(s): "LIPASE", "AMYLASE" in the last 168 hours. No results for input(s): "AMMONIA" in the last 168 hours. Cardiac Enzymes: No results for input(s): "CKTOTAL", "CKMB", "CKMBINDEX", "TROPONINI" in the last 168 hours. BNP (last 3 results) No results for input(s): "BNP" in the last 8760 hours. CBG: Recent Labs  Lab 09/09/22 1522 09/11/22 2130  GLUCAP 102* 138*    Time spent: 35  minutes  Signed:  Val Riles  [facility]  09/13/2022 1:28 PM

## 2022-09-13 NOTE — Plan of Care (Signed)
  Problem: Health Behavior/Discharge Planning: Goal: Ability to manage health-related needs will improve Outcome: Progressing   Problem: Clinical Measurements: Goal: Diagnostic test results will improve Outcome: Progressing   Problem: Activity: Goal: Risk for activity intolerance will decrease Outcome: Progressing   Problem: Nutrition: Goal: Adequate nutrition will be maintained Outcome: Progressing   Problem: Elimination: Goal: Will not experience complications related to bowel motility Outcome: Progressing   Problem: Pain Managment: Goal: General experience of comfort will improve Outcome: Progressing   Problem: Safety: Goal: Ability to remain free from injury will improve Outcome: Progressing   Problem: Skin Integrity: Goal: Risk for impaired skin integrity will decrease Outcome: Progressing

## 2022-10-19 ENCOUNTER — Other Ambulatory Visit: Payer: Self-pay | Admitting: Family Medicine

## 2022-10-19 ENCOUNTER — Ambulatory Visit
Admission: RE | Admit: 2022-10-19 | Discharge: 2022-10-19 | Disposition: A | Discharge status: Home / Self Care | Payer: Medicare PPO | Source: Ambulatory Visit | Attending: Family Medicine | Admitting: Family Medicine

## 2022-10-19 ENCOUNTER — Ambulatory Visit
Admission: RE | Admit: 2022-10-19 | Discharge: 2022-10-19 | Disposition: A | Discharge status: Home / Self Care | Payer: Medicare PPO | Attending: Family Medicine | Admitting: Family Medicine

## 2022-10-19 DIAGNOSIS — F17210 Nicotine dependence, cigarettes, uncomplicated: Secondary | ICD-10-CM | POA: Diagnosis not present

## 2022-10-27 ENCOUNTER — Ambulatory Visit
Admission: RE | Admit: 2022-10-27 | Discharge: 2022-10-27 | Disposition: A | Discharge status: Home / Self Care | Payer: Medicare PPO | Source: Ambulatory Visit | Attending: Family Medicine | Admitting: Family Medicine

## 2022-10-27 DIAGNOSIS — F17210 Nicotine dependence, cigarettes, uncomplicated: Secondary | ICD-10-CM

## 2022-11-18 ENCOUNTER — Other Ambulatory Visit: Payer: Self-pay | Admitting: *Deleted

## 2022-11-18 DIAGNOSIS — Z87891 Personal history of nicotine dependence: Secondary | ICD-10-CM

## 2022-11-18 DIAGNOSIS — Z122 Encounter for screening for malignant neoplasm of respiratory organs: Secondary | ICD-10-CM

## 2022-11-23 ENCOUNTER — Other Ambulatory Visit: Payer: Self-pay | Admitting: Gastroenterology

## 2022-11-23 DIAGNOSIS — K7460 Unspecified cirrhosis of liver: Secondary | ICD-10-CM

## 2023-01-09 ENCOUNTER — Other Ambulatory Visit: Payer: Self-pay | Admitting: *Deleted

## 2023-01-09 DIAGNOSIS — I739 Peripheral vascular disease, unspecified: Secondary | ICD-10-CM

## 2023-01-16 ENCOUNTER — Ambulatory Visit (INDEPENDENT_AMBULATORY_CARE_PROVIDER_SITE_OTHER)
Admission: RE | Admit: 2023-01-16 | Discharge: 2023-01-16 | Disposition: A | Discharge status: Home / Self Care | Payer: Medicare PPO | Source: Ambulatory Visit | Attending: Surgery | Admitting: Surgery

## 2023-01-16 ENCOUNTER — Ambulatory Visit: Payer: Medicare PPO | Admitting: Physician Assistant

## 2023-01-16 ENCOUNTER — Ambulatory Visit (HOSPITAL_COMMUNITY)
Admission: RE | Admit: 2023-01-16 | Discharge: 2023-01-16 | Disposition: A | Discharge status: Home / Self Care | Payer: Medicare PPO | Source: Ambulatory Visit | Attending: Surgery | Admitting: Surgery

## 2023-01-16 VITALS — BP 132/71 | HR 57 | Temp 97.9°F | Resp 18 | Ht 70.0 in | Wt 277.3 lb

## 2023-01-16 DIAGNOSIS — I739 Peripheral vascular disease, unspecified: Secondary | ICD-10-CM | POA: Diagnosis present

## 2023-01-16 DIAGNOSIS — Z95828 Presence of other vascular implants and grafts: Secondary | ICD-10-CM

## 2023-01-16 LAB — VAS US ABI WITH/WO TBI: Right ABI: 1.2

## 2023-01-16 MED ORDER — CLOPIDOGREL BISULFATE 75 MG PO TABS: 75.0000 mg | ORAL_TABLET | Freq: Every day | ORAL | 11 refills | Status: DC

## 2023-01-16 NOTE — Progress Notes (Signed)
Office Note     CC:  follow up Requesting Provider:  Gildardo Pounds, PA  HPI: Kevin Gross is a 67 y.o. (Mar 06, 1956) male who presents for follow up of PAD. He is s/p #1: Right iliofemoral endarterectomy with vein patch angioplasty #2: Left iliofemoral endarterectomy with vein patch angioplasty #3: Stent, left common iliac artery #4: Stent, left external iliac artery #5: Intra-arterial lithotripsy: Left common and external iliac arteries #6: Intra-arterial lithotripsy: Right common and external iliac arteries #7: Stent, right external iliac artery #8: Stent, right common femoral artery on 11/26/2021 by Dr. Myra Gianotti. Following this he was readmitted with bilateral post operative groin wounds requiring debridement on 4/18, 4/20, and 12/22/21. He subsequently healed both of his groin wounds. He has been doing well post operatively since.   Today he denies any pain in his legs on ambulation or rest, no tissue loss. His only concern today is of super pubic fullness since surgery. He says it is not painful but just that it makes it challenging to urinate. He explains that this was not present prior to surgery. Otherwise he is having no issues with his groin wounds, they remain healed. He says he is currently walking better than he has in years. 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 He is compliant with his Aspirin, Statin and Plavix.   The pt is on a statin for cholesterol management.  The pt is on a daily aspirin.   Other AC:  Plavix The pt is on BB, ACEI for hypertension.   The pt is not diabetic.   Tobacco hx:  former  Past Medical History:  Diagnosis Date   Alcoholic cirrhosis of liver (HCC)    CHF (congestive heart failure) (HCC)    Clotting disorder (HCC)    Bilateral legs-pt states plaque build up in both legs requiring intervention   Complication of anesthesia    pt states that years ago during colonoscopy at [facility] they had to abort the procedure  due to some "cardiac issue" had to see cardiologist   COPD (chronic obstructive pulmonary disease) (HCC)    Hyperlipidemia    Hypertension    Pre-diabetes    Sleep apnea    uses cpap    Past Surgical History:  Procedure Laterality Date   ABDOMINAL AORTOGRAM W/LOWER EXTREMITY N/A 10/20/2021   Procedure: ABDOMINAL AORTOGRAM W/LOWER EXTREMITY;  Surgeon: Iran Ouch, MD;  [facility];  Service: Cardiovascular;  Laterality: N/A;   ALLOGRAFT APPLICATION Bilateral 12/22/2021   Procedure: ALLOGRAFT APPLICATION;  Surgeon: Nada Libman, MD;  Location: [facility] OR;  Service: Vascular;  Laterality: Bilateral;   ANGIOPLASTY Left 11/26/2021   Procedure: LEFT INFRARENAL LITHOTRIPSY;  Surgeon: Nada Libman, MD;  Location: [facility] OR;  Service: Vascular;  Laterality: Left;   APPLICATION OF WOUND VAC Bilateral 12/13/2021   Procedure: APPLICATION OF WOUND VAC;  Surgeon: Victorino Sparrow, MD;  Location: [facility] OR;  Service: Vascular;  Laterality: Bilateral;   CARDIAC CATHETERIZATION     [facility]   COLONOSCOPY     COLONOSCOPY WITH PROPOFOL N/A 01/17/2018   Procedure: COLONOSCOPY WITH PROPOFOL;  Surgeon: Toledo, Boykin Nearing, MD;  Location: [facility] ENDOSCOPY;  Service: Gastroenterology;  Laterality: N/A;   COLONOSCOPY WITH PROPOFOL N/A 09/12/2022   Procedure: COLONOSCOPY WITH PROPOFOL;  Surgeon: Regis Bill, MD;  Location: [facility] ENDOSCOPY;  Service: Endoscopy;  Laterality: N/A;   ENDARTERECTOMY FEMORAL Bilateral 11/26/2021   Procedure: BILATERAL FEMORAL ENDARTERECTOMY  WITH VEIN PATCH ANGIOPLASTY;  Surgeon: Nada Libman, MD;  [facility] OR;  Service: Vascular;  Laterality: Bilateral;   ESOPHAGOGASTRODUODENOSCOPY (EGD) WITH PROPOFOL N/A 01/17/2018   Procedure: ESOPHAGOGASTRODUODENOSCOPY (EGD) WITH PROPOFOL;  Surgeon: Toledo, Boykin Nearing, MD;  Location: [facility] ENDOSCOPY;  Service: Gastroenterology;  Laterality: N/A;   ESOPHAGOGASTRODUODENOSCOPY (EGD) WITH PROPOFOL N/A 09/12/2022   Procedure:  ESOPHAGOGASTRODUODENOSCOPY (EGD) WITH PROPOFOL;  Surgeon: Regis Bill, MD;  Location: [facility] ENDOSCOPY;  Service: Endoscopy;  Laterality: N/A;   GROIN DEBRIDEMENT Bilateral 12/16/2021   Procedure: IRRIGATION AND DEBRIDEMENT OF BILATERAL GROINS;  Surgeon: Nada Libman, MD;  [facility] OR;  Service: Vascular;  Laterality: Bilateral;   INCISION AND DRAINAGE OF WOUND Bilateral 12/13/2021   Procedure: IRRIGATION AND DEBRIDEMENT OF BILATERAL GROIN WOUNDS;  Surgeon: Victorino Sparrow, MD;  Location: [facility] OR;  Service: Vascular;  Laterality: Bilateral;   INSERTION OF ILIAC STENT Bilateral 11/26/2021   Procedure: BILATERAL ILIAC STENTING USING 56mmX80mm INNOVA STENT ON LEFT ILIAC AND 69mmX59mm, 77mmX79mm VBX AND 35mmX7.5cm VIABAHN STENT ON RIGHT ILIAC;  Surgeon: Nada Libman, MD;  Location[facility] OR;  Service: Vascular;  Laterality: Bilateral;   ORIF ANKLE FRACTURE Right    PROSTATE BIOPSY N/A 04/01/2021   Procedure: PROSTATE BIOPSY Addison Bailey;  Surgeon: Orson Ape, MD;  Location: [facility] ORS;  Service: Urology;  Laterality: N/A;   VEIN HARVEST Bilateral 11/26/2021   Procedure: VEIN HARVEST OF BILATERAL SPAHENOUS VEINS;  Surgeon: Nada Libman, MD;  [facility] OR;  Service: Vascular;  Laterality: Bilateral;   WOUND DEBRIDEMENT Bilateral 12/22/2021   Procedure: INCISION AND DEBRIDEMENT OF BILATERAL GROINS;  Surgeon: Nada Libman, MD;  [facility] OR;  Service: Vascular;  Laterality: Bilateral;    Social History   Socioeconomic History   Marital status: Married    Spouse name: Kevin Gross   Number of children: 2   Years of education: Not on file   Highest education level: Not on file  Occupational History   Occupation: disabled  Tobacco Use   Smoking status: Former    Packs/day: 1.00    Years: 47.00    Additional pack years: 0.00    Total pack years: 47.00    Types: Cigarettes    Quit date: 2016    Years since quitting: 8.3    Passive exposure: Never   Smokeless tobacco: Never  Vaping  Use   Vaping Use: Never used  Substance and Sexual Activity   Alcohol use: Not Currently   Drug use: No   Sexual activity: Not Currently  Other Topics Concern   Not on file  Social History Narrative   Not on file   Social Determinants of Health   Financial Resource Strain: Not on file  Food Insecurity: Not on file  Transportation Needs: Not on file  Physical Activity: Not on file  Stress: Not on file  Social Connections: Not on file  Intimate Partner Violence: Not on file    Family History  Problem Relation Age of Onset   Heart disease Mother    Heart disease Brother     Current Outpatient Medications  Medication Sig Dispense Refill   acetaminophen (TYLENOL) 325 MG tablet Take 2 tablets (650 mg total) by mouth every 6 (six) hours as needed for mild pain (or Fever >/= 101). (Patient not taking: Reported on 01/16/2023) 30 tablet 0   aspirin EC 81 MG tablet Take 81 mg by mouth every morning.     chlorthalidone (HYGROTON) 25 MG tablet Take 25  mg by mouth daily.     clopidogrel (PLAVIX) 75 MG tablet Take 1 tablet (75 mg total) by mouth daily. 30 tablet 2   ezetimibe (ZETIA) 10 MG tablet Take 1 tablet (10 mg total) by mouth daily. 90 tablet 3   iron polysaccharides (NIFEREX) 150 MG capsule Take 1 capsule (150 mg total) by mouth daily. 90 capsule 0   lisinopril (ZESTRIL) 10 MG tablet Take 10 mg by mouth daily.     pantoprazole (PROTONIX) 20 MG tablet Take 1 tablet (20 mg total) by mouth daily. 30 tablet 2   simvastatin (ZOCOR) 20 MG tablet Take 20 mg by mouth in the morning.     zolpidem (AMBIEN) 5 MG tablet Take 5 mg by mouth at bedtime.     No current facility-administered medications for this visit.    No Known Allergies   REVIEW OF SYSTEMS:   [X]  denotes positive finding, [ ]  denotes negative finding Cardiac  Comments:  Chest pain or chest pressure:    Shortness of breath upon exertion:    Short of breath when lying flat:    Irregular heart rhythm:        Vascular     Pain in calf, thigh, or hip brought on by ambulation:    Pain in feet at night that wakes you up from your sleep:     Blood clot in your veins:    Leg swelling:         Pulmonary    Oxygen at home:    Productive cough:     Wheezing:         Neurologic    Sudden weakness in arms or legs:     Sudden numbness in arms or legs:     Sudden onset of difficulty speaking or slurred speech:    Temporary loss of vision in one eye:     Problems with dizziness:         Gastrointestinal    Blood in stool:     Vomited blood:         Genitourinary    Burning when urinating:     Blood in urine:        Psychiatric    Major depression:         Hematologic    Bleeding problems:    Problems with blood clotting too easily:        Skin    Rashes or ulcers:        Constitutional    Fever or chills:      PHYSICAL EXAMINATION:  Vitals:   01/16/23 1123  BP: 132/71  Pulse: (!) 57  Resp: 18  Temp: 97.9 F (36.6 C)  TempSrc: Temporal  SpO2: 98%  Weight: 277 lb 4.8 oz (125.8 kg)  Height: 5\' 10"  (1.778 m)    General:  WDWN in NAD; vital signs documented above Gait: Normal HENT: WNL, normocephalic Pulmonary: normal non-labored breathing , without wheezing Cardiac: regular HR Abdomen: soft, NT, no masses. Has suprapubic fullness. No appreciable hernia or fluid collection. Non tender. No overlying skin changes Vascular Exam/Pulses: 2+ femoral, no palpable distal pulses. Doppler Dp/Pt/ Peroneal signals bilaterally. Feet warm and well perfused Extremities: without ischemic changes, without Gangrene , without cellulitis; without open wounds;  Musculoskeletal: no muscle wasting or atrophy  Neurologic: A&O X 3 Psychiatric:  The pt has Normal affect.   Non-Invasive Vascular Imaging:   +-------+-----------+-----------+------------+------------+  ABI/TBIToday's ABIToday's TBIPrevious ABIPrevious TBI  +-------+-----------+-----------+------------+------------+  Right 1.20  0.59       1.12        0.48          +-------+-----------+-----------+------------+------------+  Left  [place]         0.79       0.93        0.44          +-------+-----------+-----------+------------+------------+   Stenosis: +--------------------+---------------+  Location            Stent            +--------------------+---------------+  Right Common Iliac  no stenosis      +--------------------+---------------+  Left Common Iliac   50-99% stenosis  +--------------------+---------------+  Right External Iliac50-99% stenosis  +--------------------+---------------+  Left External Iliac 50-99% stenosis  +--------------------+---------------+   Left CFA is dilated with proximal 0.87 x 0.80 cm diameter increasing to 2.3 x 2.6cm diameter at the mid CFA level.    ASSESSMENT/PLAN:: 67 y.o. male here for follow up of PAD. He is s/p #1: Right iliofemoral endarterectomy with vein patch angioplasty #2: Left iliofemoral endarterectomy with vein patch angioplasty #3: Stent, left common iliac artery #4: Stent, left external iliac artery #5: Intra-arterial lithotripsy: Left common and external iliac arteries #6: Intra-arterial lithotripsy: Right common and external iliac arteries #7: Stent, right external iliac artery #8: Stent, right common femoral artery on 11/26/2021 by Dr. Myra Gianotti. Following this he was readmitted with bilateral post operative groin wounds requiring debridement on 4/18, 4/20, and 12/22/21. He subsequently healed both of his groin wounds. He has been doing well post operatively since. He is without any rest pain, claudication or tissue loss. - ABI today is stable from prior study 6 months ago - Duplex shoes elevated velocities in bilateral common and external iliac stents but this is minimally changed from prior study. Also noted is left CFA dilatation. There is vein patch in this area so suspect this is reason. Will continue to monitor this going forward - He has  fullness in suprapubic region. I do not suspect fluid collection or hernia. He has GI ultrasound evaluation next week and I have recommended having them observe this area if possible. He will contact our office if he needs further evaluation - Continue Aspirin, Statin and Plavix. Refill for Plavix sent to patients pharmacy - He will follow up in 6 months with repeat ABI and Bilateral Aorto/ iliac duplex   Graceann Congress, PA-C [facility] [phone]  Clinic MD:  Myra Gianotti

## 2023-01-25 ENCOUNTER — Other Ambulatory Visit: Payer: Self-pay

## 2023-01-25 DIAGNOSIS — I70213 Atherosclerosis of native arteries of extremities with intermittent claudication, bilateral legs: Secondary | ICD-10-CM

## 2023-01-25 DIAGNOSIS — I739 Peripheral vascular disease, unspecified: Secondary | ICD-10-CM

## 2023-03-29 ENCOUNTER — Encounter: Payer: Self-pay | Admitting: Ophthalmology

## 2023-03-29 NOTE — Discharge Instructions (Signed)

## 2023-04-04 ENCOUNTER — Ambulatory Visit: Payer: Medicare PPO | Admitting: Anesthesiology

## 2023-04-04 ENCOUNTER — Other Ambulatory Visit: Payer: Self-pay

## 2023-04-04 ENCOUNTER — Ambulatory Visit
Admission: RE | Admit: 2023-04-04 | Discharge: 2023-04-04 | Disposition: A | Discharge status: Home / Self Care | Payer: Medicare PPO | Attending: Ophthalmology | Admitting: Ophthalmology

## 2023-04-04 ENCOUNTER — Encounter
Admission: RE | Disposition: A | Discharge status: Home / Self Care | Payer: Self-pay | Source: Home / Self Care | Attending: Ophthalmology

## 2023-04-04 DIAGNOSIS — J449 Chronic obstructive pulmonary disease, unspecified: Secondary | ICD-10-CM | POA: Insufficient documentation

## 2023-04-04 DIAGNOSIS — Z87891 Personal history of nicotine dependence: Secondary | ICD-10-CM | POA: Insufficient documentation

## 2023-04-04 DIAGNOSIS — G4730 Sleep apnea, unspecified: Secondary | ICD-10-CM | POA: Insufficient documentation

## 2023-04-04 DIAGNOSIS — I739 Peripheral vascular disease, unspecified: Secondary | ICD-10-CM | POA: Diagnosis not present

## 2023-04-04 DIAGNOSIS — E669 Obesity, unspecified: Secondary | ICD-10-CM | POA: Diagnosis not present

## 2023-04-04 DIAGNOSIS — N183 Chronic kidney disease, stage 3 unspecified: Secondary | ICD-10-CM | POA: Diagnosis not present

## 2023-04-04 DIAGNOSIS — I130 Hypertensive heart and chronic kidney disease with heart failure and stage 1 through stage 4 chronic kidney disease, or unspecified chronic kidney disease: Secondary | ICD-10-CM | POA: Insufficient documentation

## 2023-04-04 DIAGNOSIS — R7303 Prediabetes: Secondary | ICD-10-CM | POA: Insufficient documentation

## 2023-04-04 DIAGNOSIS — H2512 Age-related nuclear cataract, left eye: Secondary | ICD-10-CM | POA: Insufficient documentation

## 2023-04-04 DIAGNOSIS — I509 Heart failure, unspecified: Secondary | ICD-10-CM | POA: Diagnosis not present

## 2023-04-04 DIAGNOSIS — I2510 Atherosclerotic heart disease of native coronary artery without angina pectoris: Secondary | ICD-10-CM | POA: Diagnosis not present

## 2023-04-04 DIAGNOSIS — Z8249 Family history of ischemic heart disease and other diseases of the circulatory system: Secondary | ICD-10-CM | POA: Diagnosis not present

## 2023-04-04 DIAGNOSIS — K7030 Alcoholic cirrhosis of liver without ascites: Secondary | ICD-10-CM | POA: Insufficient documentation

## 2023-04-04 DIAGNOSIS — I4891 Unspecified atrial fibrillation: Secondary | ICD-10-CM | POA: Insufficient documentation

## 2023-04-04 DIAGNOSIS — Z7902 Long term (current) use of antithrombotics/antiplatelets: Secondary | ICD-10-CM | POA: Diagnosis not present

## 2023-04-04 DIAGNOSIS — Z6841 Body Mass Index (BMI) 40.0 and over, adult: Secondary | ICD-10-CM | POA: Insufficient documentation

## 2023-04-04 HISTORY — DX: Chronic kidney disease, stage 3 unspecified: N18.30

## 2023-04-04 HISTORY — PX: CATARACT EXTRACTION W/PHACO: SHX586

## 2023-04-04 SURGERY — PHACOEMULSIFICATION, CATARACT, WITH IOL INSERTION
Anesthesia: Monitor Anesthesia Care | Laterality: Left

## 2023-04-04 MED ORDER — SIGHTPATH DOSE#1 BSS IO SOLN
INTRAOCULAR | Status: DC | PRN
  Administered 2023-04-04: 52 mL via OPHTHALMIC

## 2023-04-04 MED ORDER — MIDAZOLAM HCL 2 MG/2ML IJ SOLN
INTRAMUSCULAR | Status: DC | PRN
  Administered 2023-04-04: 2 mg via INTRAVENOUS

## 2023-04-04 MED ORDER — ARMC OPHTHALMIC DILATING DROPS
1.0000 | OPHTHALMIC | Status: DC | PRN
  Administered 2023-04-04 (×3): 1 via OPHTHALMIC

## 2023-04-04 MED ORDER — MOXIFLOXACIN HCL 0.5 % OP SOLN
OPHTHALMIC | Status: DC | PRN
  Administered 2023-04-04: .2 mL via OPHTHALMIC

## 2023-04-04 MED ORDER — SIGHTPATH DOSE#1 BSS IO SOLN
INTRAOCULAR | Status: DC | PRN
  Administered 2023-04-04: 1 mL

## 2023-04-04 MED ORDER — SIGHTPATH DOSE#1 NA CHONDROIT SULF-NA HYALURON 40-17 MG/ML IO SOLN
INTRAOCULAR | Status: DC | PRN
  Administered 2023-04-04: 1 mL via INTRAOCULAR

## 2023-04-04 MED ORDER — BRIMONIDINE TARTRATE-TIMOLOL 0.2-0.5 % OP SOLN
OPHTHALMIC | Status: DC | PRN
  Administered 2023-04-04: 1 [drp] via OPHTHALMIC

## 2023-04-04 MED ORDER — LACTATED RINGERS IV SOLN: INTRAVENOUS | Status: DC

## 2023-04-04 MED ORDER — SIGHTPATH DOSE#1 BSS IO SOLN
INTRAOCULAR | Status: DC | PRN
  Administered 2023-04-04: 15 mL via INTRAOCULAR

## 2023-04-04 MED ORDER — TETRACAINE HCL 0.5 % OP SOLN
1.0000 [drp] | OPHTHALMIC | Status: DC | PRN
  Administered 2023-04-04 (×3): 1 [drp] via OPHTHALMIC

## 2023-04-04 MED ORDER — FENTANYL CITRATE (PF) 100 MCG/2ML IJ SOLN
INTRAMUSCULAR | Status: DC | PRN
  Administered 2023-04-04: 25 ug via INTRAVENOUS

## 2023-04-04 SURGICAL SUPPLY — 10 items
ANGLE REVERSE CUT SHRT 25GA (CUTTER) ×1
CATARACT SUITE SIGHTPATH (MISCELLANEOUS) ×1
CYSTOTOME ANGL RVRS SHRT 25G (CUTTER) ×1 IMPLANT
FEE CATARACT SUITE SIGHTPATH (MISCELLANEOUS) ×1 IMPLANT
GLOVE BIOGEL PI IND STRL 8 (GLOVE) ×1 IMPLANT
GLOVE SURG ENC TEXT LTX SZ8 (GLOVE) ×1 IMPLANT
LENS IOL TECNIS EYHANCE 21.0 (Intraocular Lens) IMPLANT
NDL FILTER BLUNT 18X1 1/2 (NEEDLE) ×1 IMPLANT
NEEDLE FILTER BLUNT 18X1 1/2 (NEEDLE) ×1 IMPLANT
SYR 3ML LL SCALE MARK (SYRINGE) ×1 IMPLANT

## 2023-04-04 NOTE — Transfer of Care (Signed)
Immediate Anesthesia Transfer of Care Note  Patient: Kevin Gross  Procedure(s) Performed: CATARACT EXTRACTION PHACO AND INTRAOCULAR LENS PLACEMENT (IOC) LEFT 8.71 00:45.5 (Left)  Patient Location: PACU  Anesthesia Type: MAC  Level of Consciousness: awake, alert  and patient cooperative  Airway and Oxygen Therapy: Patient Spontanous Breathing and Patient connected to supplemental oxygen  Post-op Assessment: Post-op Vital signs reviewed, Patient's Cardiovascular Status Stable, Respiratory Function Stable, Patent Airway and No signs of Nausea or vomiting  Post-op Vital Signs: Reviewed and stable  Complications: No notable events documented.

## 2023-04-04 NOTE — H&P (Signed)
[facility]   Primary Care Physician:  Gildardo Pounds, [place] Ophthalmologist: Dr. Druscilla Brownie  Pre-Procedure History & Physical: HPI:  Kevin Gross is a 67 y.o. male here for cataract surgery.   Past Medical History:  Diagnosis Date   Alcoholic cirrhosis of liver (HCC)    CHF (congestive heart failure) (HCC)    CKD (chronic kidney disease), stage III (HCC)    Clotting disorder (HCC)    Bilateral legs-pt states plaque build up in both legs requiring intervention   Complication of anesthesia    pt states that years ago during colonoscopy at [facility] they had to abort the procedure due to some "cardiac issue" had to see cardiologist   COPD (chronic obstructive pulmonary disease) (HCC)    Hyperlipidemia    Hypertension    Pre-diabetes    Sleep apnea    uses cpap    Past Surgical History:  Procedure Laterality Date   ABDOMINAL AORTOGRAM W/LOWER EXTREMITY N/A 10/20/2021   Procedure: ABDOMINAL AORTOGRAM W/LOWER EXTREMITY;  Surgeon: Iran Ouch, MD;  [facility];  Service: Cardiovascular;  Laterality: N/A;   ALLOGRAFT APPLICATION Bilateral 12/22/2021   Procedure: ALLOGRAFT APPLICATION;  Surgeon: Nada Libman, MD;  Location: [facility] OR;  Service: Vascular;  Laterality: Bilateral;   ANGIOPLASTY Left 11/26/2021   Procedure: LEFT INFRARENAL LITHOTRIPSY;  Surgeon: Nada Libman, MD;  Location: [facility] OR;  Service: Vascular;  Laterality: Left;   APPLICATION OF WOUND VAC Bilateral 12/13/2021   Procedure: APPLICATION OF WOUND VAC;  Surgeon: Victorino Sparrow, MD;  Location: [facility] OR;  Service: Vascular;  Laterality: Bilateral;   CARDIAC CATHETERIZATION     [facility]   COLONOSCOPY     COLONOSCOPY WITH PROPOFOL N/A 01/17/2018   Procedure: COLONOSCOPY WITH PROPOFOL;  Surgeon: Toledo, Boykin Nearing, MD;  Location: [facility] ENDOSCOPY;  Service: Gastroenterology;  Laterality: N/A;   COLONOSCOPY WITH PROPOFOL N/A 09/12/2022   Procedure: COLONOSCOPY WITH PROPOFOL;  Surgeon: Regis Bill, MD;   Location: [facility] ENDOSCOPY;  Service: Endoscopy;  Laterality: N/A;   ENDARTERECTOMY FEMORAL Bilateral 11/26/2021   Procedure: BILATERAL FEMORAL ENDARTERECTOMY WITH VEIN PATCH ANGIOPLASTY;  Surgeon: Nada Libman, MD;  [facility] OR;  Service: Vascular;  Laterality: Bilateral;   ESOPHAGOGASTRODUODENOSCOPY (EGD) WITH PROPOFOL N/A 01/17/2018   Procedure: ESOPHAGOGASTRODUODENOSCOPY (EGD) WITH PROPOFOL;  Surgeon: Toledo, Boykin Nearing, MD;  Location: [facility] ENDOSCOPY;  Service: Gastroenterology;  Laterality: N/A;   ESOPHAGOGASTRODUODENOSCOPY (EGD) WITH PROPOFOL N/A 09/12/2022   Procedure: ESOPHAGOGASTRODUODENOSCOPY (EGD) WITH PROPOFOL;  Surgeon: Regis Bill, MD;  Location: [facility] ENDOSCOPY;  Service: Endoscopy;  Laterality: N/A;   GROIN DEBRIDEMENT Bilateral 12/16/2021   Procedure: IRRIGATION AND DEBRIDEMENT OF BILATERAL GROINS;  Surgeon: Nada Libman, MD;  Location: [facility] OR;  Service: Vascular;  Laterality: Bilateral;   INCISION AND DRAINAGE OF WOUND Bilateral 12/13/2021   Procedure: IRRIGATION AND DEBRIDEMENT OF BILATERAL GROIN WOUNDS;  Surgeon: Victorino Sparrow, MD;  Location: [facility] OR;  Service: Vascular;  Laterality: Bilateral;   INSERTION OF ILIAC STENT Bilateral 11/26/2021   Procedure: BILATERAL ILIAC STENTING USING 9mmX80mm INNOVA STENT ON LEFT ILIAC AND 81mmX59mm, 78mmX79mm VBX AND 107mmX7.5cm VIABAHN STENT ON RIGHT ILIAC;  Surgeon: Nada Libman, MD;  [facility] OR;  Service: Vascular;  Laterality: Bilateral;   ORIF ANKLE FRACTURE Right    PROSTATE BIOPSY N/A 04/01/2021   Procedure: PROSTATE BIOPSY Addison Bailey;  Surgeon: Orson Ape, MD;  Location: [facility] ORS;  Service: Urology;  Laterality: N/A;   VEIN HARVEST Bilateral 11/26/2021  Procedure: VEIN HARVEST OF BILATERAL SPAHENOUS VEINS;  Surgeon: Nada Libman, MD;  Location: [facility] OR;  Service: Vascular;  Laterality: Bilateral;   WOUND DEBRIDEMENT Bilateral 12/22/2021   Procedure: INCISION AND DEBRIDEMENT OF BILATERAL GROINS;  Surgeon: Nada Libman, MD;  Location: [facility] OR;  Service: Vascular;  Laterality: Bilateral;    Prior to Admission medications   Medication Sig Start Date End Date Taking? Authorizing Provider  albuterol (VENTOLIN HFA) 108 (90 Base) MCG/ACT inhaler Inhale into the lungs every 6 (six) hours as needed for wheezing or shortness of breath.   Yes [provider]  allopurinol (ZYLOPRIM) 100 MG tablet Take 100 mg by mouth daily.   Yes [provider]  amiodarone (PACERONE) 200 MG tablet Take 200 mg by mouth daily.   Yes [provider]  aspirin EC 81 MG tablet Take 81 mg by mouth every morning.   Yes [provider]  chlorthalidone (HYGROTON) 25 MG tablet Take 25 mg by mouth daily.   Yes [provider]  Cholecalciferol (VITAMIN D3) 25 MCG (1000 UT) CAPS Take by mouth daily.   Yes [provider]  clopidogrel (PLAVIX) 75 MG tablet Take 1 tablet (75 mg total) by mouth daily. 01/16/23  Yes Baglia, Corrina, PA-C  docusate calcium (SURFAK) 240 MG capsule Take 240 mg by mouth daily.   Yes [provider]  ezetimibe (ZETIA) 10 MG tablet Take 1 tablet (10 mg total) by mouth daily. 04/10/18  Yes Gollan, Tollie Pizza, MD  fluticasone (FLONASE) 50 MCG/ACT nasal spray Place into both nostrils daily.   Yes [provider]  iron polysaccharides (NIFEREX) 150 MG capsule Take 1 capsule (150 mg total) by mouth daily. 09/14/22 04/04/23 Yes Gillis Santa, MD  lisinopril (ZESTRIL) 10 MG tablet Take 10 mg by mouth daily.   Yes [provider]  naloxone (NARCAN) nasal spray 4 mg/0.1 mL Place 1 spray into the nose.   Yes [provider]  pantoprazole (PROTONIX) 20 MG tablet Take 1 tablet (20 mg total) by mouth daily. 09/14/22 03/29/23 Yes Gillis Santa, MD  predniSONE (DELTASONE) 10 MG tablet Take 10 mg by mouth as needed.   Yes [provider]  simvastatin (ZOCOR) 20 MG tablet Take 20 mg by mouth in the morning.   Yes [provider]  zolpidem  (AMBIEN) 5 MG tablet Take 10 mg by mouth at bedtime.   Yes [provider]  acetaminophen (TYLENOL) 325 MG tablet Take 2 tablets (650 mg total) by mouth every 6 (six) hours as needed for mild pain (or Fever >/= 101). Patient not taking: Reported on 04/04/2023 02/22/19   Altamese Dilling, MD    Allergies as of 03/13/2023   (No Known Allergies)    Family History  Problem Relation Age of Onset   Heart disease Mother    Heart disease Brother     Social History   Socioeconomic History   Marital status: Married    Spouse name: Adelle   Number of children: 2   Years of education: Not on file   Highest education level: Not on file  Occupational History   Occupation: disabled  Tobacco Use   Smoking status: Former    Current packs/day: 0.00    Average packs/day: 1 pack/day for 47.0 years (47.0 ttl pk-yrs)    Types: Cigarettes    Start date: 49    Quit date: 2016    Years since quitting: 8.6    Passive exposure: Never   Smokeless tobacco:  Never  Vaping Use   Vaping status: Never Used  Substance and Sexual Activity   Alcohol use: Not Currently   Drug use: No   Sexual activity: Not Currently  Other Topics Concern   Not on file  Social History Narrative   Not on file   Social Determinants of Health   Financial Resource Strain: Not on file  Food Insecurity: Not on file  Transportation Needs: Not on file  Physical Activity: Not on file  Stress: Not on file  Social Connections: Not on file  Intimate Partner Violence: Not on file    Review of Systems: See HPI, otherwise negative ROS  Physical Exam: BP 135/64   Pulse (!) 56   Temp 98.1 F (36.7 C) (Temporal)   Resp 12   Ht 5\' 9"  (1.753 m)   Wt 124.7 kg   SpO2 100%   BMI 40.61 kg/m  General:   Alert, cooperative in NAD Head:  Normocephalic and atraumatic. Respiratory:  Normal work of breathing. Cardiovascular:  RRR  Impression/Plan: Kevin Gross is here for cataract surgery.  Risks,  benefits, limitations, and alternatives regarding cataract surgery have been reviewed with the patient.  Questions have been answered.  All parties agreeable.   Galen Manila, MD  04/04/2023, 8:09 AM

## 2023-04-04 NOTE — Anesthesia Preprocedure Evaluation (Signed)
Anesthesia Evaluation  Patient identified by MRN, date of birth, ID band Patient awake    Reviewed: Allergy & Precautions, NPO status , Patient's Chart, lab work & pertinent test results  History of Anesthesia Complications Negative for: history of anesthetic complications  Airway Mallampati: III   Neck ROM: Full    Dental  (+) Missing   Pulmonary sleep apnea and Continuous Positive Airway Pressure Ventilation , COPD, former smoker   Pulmonary exam normal breath sounds clear to auscultation       Cardiovascular hypertension, + CAD and + Peripheral Vascular Disease (on Plavix)  Normal cardiovascular exam Rhythm:Regular Rate:Normal  ECG 09/09/22:  Atrial fibrillation Low voltage, extremity and precordial leads   Neuro/Psych Hx alcohol use disorder, last use several years ago    GI/Hepatic negative GI ROS,,,(+) Cirrhosis         Endo/Other  Obesity; prediabetes  Renal/GU Renal disease (stage III CKD)     Musculoskeletal   Abdominal   Peds  Hematology  (+) Blood dyscrasia (Baseline hemoglobin is 7.3-8.3), anemia   Anesthesia Other Findings   Reproductive/Obstetrics                              Anesthesia Physical Anesthesia Plan  ASA: 3  Anesthesia Plan: MAC   Post-op Pain Management:    Induction: Intravenous  PONV Risk Score and Plan: 1 and TIVA, Treatment may vary due to age or medical condition and Midazolam  Airway Management Planned: Natural Airway  Additional Equipment:   Intra-op Plan:   Post-operative Plan:   Informed Consent: I have reviewed the patients History and Physical, chart, labs and discussed the procedure including the risks, benefits and alternatives for the proposed anesthesia with the patient or authorized representative who has indicated his/her understanding and acceptance.     Dental advisory given  Plan Discussed with: CRNA  Anesthesia Plan  Comments: (LMA/GETA backup discussed.  Patient consented for risks of anesthesia including but not limited to:  - adverse reactions to medications - damage to eyes, teeth, lips or other oral mucosa - nerve damage due to positioning  - sore throat or hoarseness - damage to heart, brain, nerves, lungs, other parts of body or loss of life  Informed patient about role of CRNA in peri- and intra-operative care.  Patient voiced understanding.)         Anesthesia Quick Evaluation

## 2023-04-04 NOTE — Op Note (Signed)
PREOPERATIVE DIAGNOSIS:  Nuclear sclerotic cataract of the left eye.   POSTOPERATIVE DIAGNOSIS:  Nuclear sclerotic cataract of the left eye.   OPERATIVE PROCEDURE:ORPROCALL@   SURGEON:  Galen Manila, MD.   ANESTHESIA:  Anesthesiologist: Reed Breech, MD CRNA: Bynum, [place], CRNA  1.      Managed anesthesia care. 2.     0.8ml of Shugarcaine was instilled following the paracentesis   COMPLICATIONS:  None.   TECHNIQUE:   Stop and chop   DESCRIPTION OF PROCEDURE:  The patient was examined and consented in the preoperative holding area where the aforementioned topical anesthesia was applied to the left eye and then brought back to the Operating Room where the left eye was prepped and draped in the usual sterile ophthalmic fashion and a lid speculum was placed. A paracentesis was created with the side port blade and the anterior chamber was filled with viscoelastic. A near clear corneal incision was performed with the steel keratome. A continuous curvilinear capsulorrhexis was performed with a cystotome followed by the capsulorrhexis forceps. Hydrodissection and hydrodelineation were carried out with BSS on a blunt cannula. The lens was removed in a stop and chop  technique and the remaining cortical material was removed with the irrigation-aspiration handpiece. The capsular bag was inflated with viscoelastic and the Technis ZCB00 lens was placed in the capsular bag without complication. The remaining viscoelastic was removed from the eye with the irrigation-aspiration handpiece. The wounds were hydrated. The anterior chamber was flushed with BSS and the eye was inflated to physiologic pressure. 0.29ml Vigamox was placed in the anterior chamber. The wounds were found to be water tight. The eye was dressed with Combigan. The patient was given protective glasses to wear throughout the day and a shield with which to sleep tonight. The patient was also given drops with which to begin a drop regimen  today and will follow-up with me in one day. Implant Name Type Inv. Item Serial No. Manufacturer Lot No. LRB No. Used Action  LENS IOL TECNIS EYHANCE 21.0 - [id number] Intraocular Lens LENS IOL TECNIS EYHANCE 21.0 [id number] [employer]  Left 1 Implanted    Procedure(s): CATARACT EXTRACTION PHACO AND INTRAOCULAR LENS PLACEMENT (IOC) LEFT 8.71 00:45.5 (Left)  Electronically signed: Galen Manila 04/04/2023 8:31 AM

## 2023-04-04 NOTE — Anesthesia Postprocedure Evaluation (Signed)
Anesthesia Post Note  Patient: Kevin Gross  Procedure(s) Performed: CATARACT EXTRACTION PHACO AND INTRAOCULAR LENS PLACEMENT (IOC) LEFT 8.71 00:45.5 (Left)  Patient location during evaluation: PACU Anesthesia Type: MAC Level of consciousness: awake and alert, oriented and patient cooperative Pain management: pain level controlled Vital Signs Assessment: post-procedure vital signs reviewed and stable Respiratory status: spontaneous breathing, nonlabored ventilation and respiratory function stable Cardiovascular status: blood pressure returned to baseline and stable Postop Assessment: adequate PO intake Anesthetic complications: no   No notable events documented.   Last Vitals:  Vitals:   04/04/23 0833 04/04/23 0837  BP: (!) 124/58 (!) 122/56  Pulse: (!) 53 (!) 53  Resp: 19 14  Temp: 36.6 C   SpO2: 97% 96%    Last Pain:  Vitals:   04/04/23 0837  TempSrc:   PainSc: 0-No pain                 Reed Breech

## 2023-04-05 ENCOUNTER — Encounter: Payer: Self-pay | Admitting: Ophthalmology

## 2023-04-10 NOTE — Anesthesia Preprocedure Evaluation (Addendum)
Anesthesia Evaluation  Patient identified by MRN, date of birth, ID band Patient awake    Reviewed: Allergy & Precautions, H&P , NPO status , Patient's Chart, lab work & pertinent test results  History of Anesthesia Complications (+) history of anesthetic complications  Airway Mallampati: III  TM Distance: >3 FB Neck ROM: Full    Dental no notable dental hx. (+) Missing   Pulmonary neg pulmonary ROS, sleep apnea , COPD, former smoker   Pulmonary exam normal breath sounds clear to auscultation       Cardiovascular hypertension, + CAD, + Peripheral Vascular Disease and +CHF  negative cardio ROS Normal cardiovascular exam+ dysrhythmias Atrial Fibrillation  Rhythm:Regular Rate:Normal  [place] preop, rate 40, received glycopyrrolate  11-08-21 systolic CHF 1. Left ventricular ejection fraction, by estimation, is 55 to 60%. The  left ventricle has normal function. The left ventricle has no regional  wall motion abnormalities. There is mild left ventricular hypertrophy.  Left ventricular diastolic parameters  are indeterminate.   2. Right ventricular systolic function is normal. The right ventricular  size is mildly enlarged.   3. The mitral valve is normal in structure. No evidence of mitral valve  regurgitation.   4. The aortic valve was not well visualized. Aortic valve regurgitation  is not visualized.   5. Aortic dilatation noted. There is mild dilatation of the ascending  aorta, measuring 40 mm.   6. The inferior vena cava is normal in size with greater than 50%  respiratory variability, suggesting right atrial pressure of 3 mmHg.   ECG 09/09/22:  Atrial fibrillation Low voltage, extremity and precordial leads      Neuro/Psych negative neurological ROS  negative psych ROS   GI/Hepatic negative GI ROS, Neg liver ROS,,,  Endo/Other  negative endocrine ROS    Renal/GU Renal diseasenegative Renal ROS  negative  genitourinary   Musculoskeletal negative musculoskeletal ROS (+) Arthritis ,    Abdominal   Peds negative pediatric ROS (+)  Hematology negative hematology ROS (+) Blood dyscrasia, anemia   Anesthesia Other Findings COPD (chronic obstructive pulmonary disease)   Systolic CHF (congestive heart failure)  Hypertension  Hyperlipidemia Clotting disorder   Sleep apnea Pre-diabetes Complication of anesthesia--had "cardiac issue" during colonoscopy at other facility Alcoholic cirrhosis of liver   CKD (chronic kidney disease), stage III     Reproductive/Obstetrics negative OB ROS                              Anesthesia Physical Anesthesia Plan  ASA: 3  Anesthesia Plan: MAC   Post-op Pain Management:    Induction: Intravenous  PONV Risk Score and Plan:   Airway Management Planned: Natural Airway and Nasal Cannula  Additional Equipment:   Intra-op Plan:   Post-operative Plan:   Informed Consent: I have reviewed the patients History and Physical, chart, labs and discussed the procedure including the risks, benefits and alternatives for the proposed anesthesia with the patient or authorized representative who has indicated his/her understanding and acceptance.     Dental Advisory Given  Plan Discussed with: Anesthesiologist, CRNA and Surgeon  Anesthesia Plan Comments: (Patient consented for risks of anesthesia including but not limited to:  - adverse reactions to medications - damage to eyes, teeth, lips or other oral mucosa - nerve damage due to positioning  - sore throat or hoarseness - Damage to heart, brain, nerves, lungs, other parts of body or loss of life  Patient voiced understanding.)  Anesthesia Quick Evaluation

## 2023-04-14 NOTE — Discharge Instructions (Signed)

## 2023-04-18 ENCOUNTER — Ambulatory Visit: Payer: Medicare PPO | Admitting: Anesthesiology

## 2023-04-18 ENCOUNTER — Encounter: Payer: Self-pay | Admitting: Ophthalmology

## 2023-04-18 ENCOUNTER — Ambulatory Visit
Admission: RE | Admit: 2023-04-18 | Discharge: 2023-04-18 | Disposition: A | Discharge status: Home / Self Care | Payer: Medicare PPO | Source: Home / Self Care | Attending: Ophthalmology | Admitting: Ophthalmology

## 2023-04-18 ENCOUNTER — Other Ambulatory Visit: Payer: Self-pay

## 2023-04-18 ENCOUNTER — Encounter
Admission: RE | Disposition: A | Discharge status: Home / Self Care | Payer: Self-pay | Source: Home / Self Care | Attending: Ophthalmology

## 2023-04-18 DIAGNOSIS — I517 Cardiomegaly: Secondary | ICD-10-CM | POA: Insufficient documentation

## 2023-04-18 DIAGNOSIS — J449 Chronic obstructive pulmonary disease, unspecified: Secondary | ICD-10-CM | POA: Insufficient documentation

## 2023-04-18 DIAGNOSIS — I5020 Unspecified systolic (congestive) heart failure: Secondary | ICD-10-CM | POA: Diagnosis not present

## 2023-04-18 DIAGNOSIS — M1990 Unspecified osteoarthritis, unspecified site: Secondary | ICD-10-CM | POA: Diagnosis not present

## 2023-04-18 DIAGNOSIS — I2510 Atherosclerotic heart disease of native coronary artery without angina pectoris: Secondary | ICD-10-CM | POA: Insufficient documentation

## 2023-04-18 DIAGNOSIS — Z87891 Personal history of nicotine dependence: Secondary | ICD-10-CM | POA: Insufficient documentation

## 2023-04-18 DIAGNOSIS — I130 Hypertensive heart and chronic kidney disease with heart failure and stage 1 through stage 4 chronic kidney disease, or unspecified chronic kidney disease: Secondary | ICD-10-CM | POA: Diagnosis not present

## 2023-04-18 DIAGNOSIS — N183 Chronic kidney disease, stage 3 unspecified: Secondary | ICD-10-CM | POA: Insufficient documentation

## 2023-04-18 DIAGNOSIS — G4730 Sleep apnea, unspecified: Secondary | ICD-10-CM | POA: Diagnosis not present

## 2023-04-18 DIAGNOSIS — Z7901 Long term (current) use of anticoagulants: Secondary | ICD-10-CM | POA: Insufficient documentation

## 2023-04-18 DIAGNOSIS — H2511 Age-related nuclear cataract, right eye: Secondary | ICD-10-CM | POA: Insufficient documentation

## 2023-04-18 DIAGNOSIS — I4891 Unspecified atrial fibrillation: Secondary | ICD-10-CM | POA: Insufficient documentation

## 2023-04-18 HISTORY — PX: CATARACT EXTRACTION W/PHACO: SHX586

## 2023-04-18 SURGERY — PHACOEMULSIFICATION, CATARACT, WITH IOL INSERTION
Anesthesia: Monitor Anesthesia Care | Site: Eye | Laterality: Right

## 2023-04-18 MED ORDER — MIDAZOLAM HCL 2 MG/2ML IJ SOLN
INTRAMUSCULAR | Status: DC | PRN
  Administered 2023-04-18: 2 mg via INTRAVENOUS

## 2023-04-18 MED ORDER — SIGHTPATH DOSE#1 BSS IO SOLN
INTRAOCULAR | Status: DC | PRN
  Administered 2023-04-18: 15 mL via INTRAOCULAR

## 2023-04-18 MED ORDER — ARMC OPHTHALMIC DILATING DROPS
1.0000 | OPHTHALMIC | Status: DC | PRN
  Administered 2023-04-18 (×3): 1 via OPHTHALMIC

## 2023-04-18 MED ORDER — SIGHTPATH DOSE#1 NA CHONDROIT SULF-NA HYALURON 40-17 MG/ML IO SOLN
INTRAOCULAR | Status: DC | PRN
  Administered 2023-04-18: 1 mL via INTRAOCULAR

## 2023-04-18 MED ORDER — BRIMONIDINE TARTRATE-TIMOLOL 0.2-0.5 % OP SOLN
OPHTHALMIC | Status: DC | PRN
  Administered 2023-04-18: 1 [drp] via OPHTHALMIC

## 2023-04-18 MED ORDER — GLYCOPYRROLATE 0.2 MG/ML IJ SOLN
0.2000 mg | Freq: Once | INTRAMUSCULAR | Status: AC
  Administered 2023-04-18: 0.2 mg via INTRAVENOUS

## 2023-04-18 MED ORDER — SIGHTPATH DOSE#1 BSS IO SOLN
INTRAOCULAR | Status: DC | PRN
  Administered 2023-04-18: 2 mL

## 2023-04-18 MED ORDER — FENTANYL CITRATE (PF) 100 MCG/2ML IJ SOLN
INTRAMUSCULAR | Status: DC | PRN
  Administered 2023-04-18: 50 ug via INTRAVENOUS

## 2023-04-18 MED ORDER — MOXIFLOXACIN HCL 0.5 % OP SOLN
OPHTHALMIC | Status: DC | PRN
  Administered 2023-04-18: .2 mL via OPHTHALMIC

## 2023-04-18 MED ORDER — LACTATED RINGERS IV SOLN: INTRAVENOUS | Status: DC

## 2023-04-18 MED ORDER — TETRACAINE HCL 0.5 % OP SOLN
1.0000 [drp] | OPHTHALMIC | Status: DC | PRN
  Administered 2023-04-18 (×3): 1 [drp] via OPHTHALMIC

## 2023-04-18 MED ORDER — SIGHTPATH DOSE#1 BSS IO SOLN
INTRAOCULAR | Status: DC | PRN
  Administered 2023-04-18: 106 mL via OPHTHALMIC

## 2023-04-18 SURGICAL SUPPLY — 12 items
ANGLE REVERSE CUT SHRT 25GA (CUTTER) ×1
CANNULA ANT/CHMB 27G (MISCELLANEOUS) IMPLANT
CANNULA ANT/CHMB 27GA (MISCELLANEOUS) ×1
CATARACT SUITE SIGHTPATH (MISCELLANEOUS) ×1
CYSTOTOME ANGL RVRS SHRT 25G (CUTTER) ×1 IMPLANT
FEE CATARACT SUITE SIGHTPATH (MISCELLANEOUS) ×1 IMPLANT
GLOVE BIOGEL PI IND STRL 8 (GLOVE) ×1 IMPLANT
GLOVE SURG LX STRL 8.0 MICRO (GLOVE) ×1 IMPLANT
LENS IOL TECNIS EYHANCE 23.0 (Intraocular Lens) IMPLANT
NDL FILTER BLUNT 18X1 1/2 (NEEDLE) ×1 IMPLANT
NEEDLE FILTER BLUNT 18X1 1/2 (NEEDLE) ×1 IMPLANT
SYR 3ML LL SCALE MARK (SYRINGE) ×1 IMPLANT

## 2023-04-18 NOTE — Transfer of Care (Signed)
Immediate Anesthesia Transfer of Care Note  Patient: Kevin Gross  Procedure(s) Performed: CATARACT EXTRACTION PHACO AND INTRAOCULAR LENS PLACEMENT (IOC) RIGHT 4.31 00:8.2 (Right: Eye)  Patient Location: PACU  Anesthesia Type: MAC  Level of Consciousness: awake, alert  and patient cooperative  Airway and Oxygen Therapy: Patient Spontanous Breathing and Patient connected to supplemental oxygen  Post-op Assessment: Post-op Vital signs reviewed, Patient's Cardiovascular Status Stable, Respiratory Function Stable, Patent Airway and No signs of Nausea or vomiting  Post-op Vital Signs: Reviewed and stable  Complications: No notable events documented.

## 2023-04-18 NOTE — Anesthesia Postprocedure Evaluation (Signed)
Anesthesia Post Note  Patient: Kevin Gross  Procedure(s) Performed: CATARACT EXTRACTION PHACO AND INTRAOCULAR LENS PLACEMENT (IOC) RIGHT 4.31 00:8.2 (Right: Eye)  Patient location during evaluation: PACU Anesthesia Type: MAC Level of consciousness: awake and alert Pain management: pain level controlled Vital Signs Assessment: post-procedure vital signs reviewed and stable Respiratory status: spontaneous breathing, nonlabored ventilation, respiratory function stable and patient connected to nasal cannula oxygen Cardiovascular status: stable and blood pressure returned to baseline Postop Assessment: no apparent nausea or vomiting Anesthetic complications: no   No notable events documented.   Last Vitals:  Vitals:   04/18/23 1232 04/18/23 1237  BP: 136/61 129/65  Pulse: (!) 51 (!) 49  Resp: 17 16  Temp: (!) 36.4 C (!) 36.4 C  SpO2: 100% 99%    Last Pain:  Vitals:   04/18/23 1237  TempSrc:   PainSc: 0-No pain                 Clora Ohmer C Shaman Muscarella

## 2023-04-18 NOTE — H&P (Signed)
[facility]   Primary Care Physician:  Gildardo Pounds, PA Ophthalmologist: Dr. Maren Reamer  Pre-Procedure History & Physical: HPI:  Kevin Gross is a 67 y.o. male here for cataract surgery.   Past Medical History:  Diagnosis Date   Alcoholic cirrhosis of liver (HCC)    CHF (congestive heart failure) (HCC)    CKD (chronic kidney disease), stage III (HCC)    Clotting disorder (HCC)    Bilateral legs-pt states plaque build up in both legs requiring intervention   Complication of anesthesia    pt states that years ago during colonoscopy at [facility] they had to abort the procedure due to some "cardiac issue" had to see cardiologist   COPD (chronic obstructive pulmonary disease) (HCC)    Hyperlipidemia    Hypertension    Pre-diabetes    Sleep apnea    uses cpap    Past Surgical History:  Procedure Laterality Date   ABDOMINAL AORTOGRAM W/LOWER EXTREMITY N/A 10/20/2021   Procedure: ABDOMINAL AORTOGRAM W/LOWER EXTREMITY;  Surgeon: Iran Ouch, MD;  Location: [facility] IN[facility]IVE [facility];  Service: Cardiovascular;  Laterality: N/A;   ALLOGRAFT APPLICATION Bilateral 12/22/2021   Procedure: ALLOGRAFT APPLICATION;  Surgeon: Nada Libman, MD;  Location: [facility] OR;  Service: Vascular;  Laterality: Bilateral;   ANGIOPLASTY Left 11/26/2021   Procedure: LEFT INFRARENAL LITHOTRIPSY;  Surgeon: Nada Libman, MD;  Location: [facility] OR;  Service: Vascular;  Laterality: Left;   APPLICATION OF WOUND VAC Bilateral 12/13/2021   Procedure: APPLICATION OF WOUND VAC;  Surgeon: Victorino Sparrow, MD;  Location: [facility] OR;  Service: Vascular;  Laterality: Bilateral;   CARDIAC CATHETERIZATION     [facility]   CATARACT EXTRACTION W/PHACO Left 04/04/2023   Procedure: CATARACT EXTRACTION PHACO AND INTRAOCULAR LENS PLACEMENT (IOC) LEFT 8.71 00:45.5;  Surgeon: Galen Manila, MD;  Location: [facility];  Service: Ophthalmology;  Laterality: Left;   COLONOSCOPY     COLONOSCOPY WITH PROPOFOL N/A 01/17/2018   Procedure:  COLONOSCOPY WITH PROPOFOL;  Surgeon: Toledo, Boykin Nearing, MD;  Location: [facility] ENDOSCOPY;  Service: Gastroenterology;  Laterality: N/A;   COLONOSCOPY WITH PROPOFOL N/A 09/12/2022   Procedure: COLONOSCOPY WITH PROPOFOL;  Surgeon: Regis Bill, MD;  Location: [facility] ENDOSCOPY;  Service: Endoscopy;  Laterality: N/A;   ENDARTERECTOMY FEMORAL Bilateral 11/26/2021   Procedure: BILATERAL FEMORAL ENDARTERECTOMY WITH VEIN PATCH ANGIOPLASTY;  Surgeon: Nada Libman, MD;  Location: [facility] OR;  Service: Vascular;  Laterality: Bilateral;   ESOPHAGOGASTRODUODENOSCOPY (EGD) WITH PROPOFOL N/A 01/17/2018   Procedure: ESOPHAGOGASTRODUODENOSCOPY (EGD) WITH PROPOFOL;  Surgeon: Toledo, Boykin Nearing, MD;  Location: [facility] ENDOSCOPY;  Service: Gastroenterology;  Laterality: N/A;   ESOPHAGOGASTRODUODENOSCOPY (EGD) WITH PROPOFOL N/A 09/12/2022   Procedure: ESOPHAGOGASTRODUODENOSCOPY (EGD) WITH PROPOFOL;  Surgeon: Regis Bill, MD;  Location: [facility] ENDOSCOPY;  Service: Endoscopy;  Laterality: N/A;   GROIN DEBRIDEMENT Bilateral 12/16/2021   Procedure: IRRIGATION AND DEBRIDEMENT OF BILATERAL GROINS;  Surgeon: Nada Libman, MD;  Location: [facility] OR;  Service: Vascular;  Laterality: Bilateral;   INCISION AND DRAINAGE OF WOUND Bilateral 12/13/2021   Procedure: IRRIGATION AND DEBRIDEMENT OF BILATERAL GROIN WOUNDS;  Surgeon: Victorino Sparrow, MD;  Location: [facility] OR;  Service: Vascular;  Laterality: Bilateral;   INSERTION OF ILIAC STENT Bilateral 11/26/2021   Procedure: BILATERAL ILIAC STENTING USING 49mmX80mm INNOVA STENT ON LEFT ILIAC AND 53mmX59mm, 73mmX79mm VBX AND 25mmX7.5cm VIABAHN STENT ON RIGHT ILIAC;  Surgeon: Nada Libman, MD;  Location: [facility] OR;  Service: Vascular;  Laterality: Bilateral;   ORIF ANKLE  FRACTURE Right    PROSTATE BIOPSY N/A 04/01/2021   Procedure: PROSTATE BIOPSY Addison Bailey;  Surgeon: Orson Ape, MD;  Location: [facility] ORS;  Service: Urology;  Laterality: N/A;   VEIN HARVEST Bilateral 11/26/2021   Procedure: VEIN  HARVEST OF BILATERAL SPAHENOUS VEINS;  Surgeon: Nada Libman, MD;  Location: [facility] OR;  Service: Vascular;  Laterality: Bilateral;   WOUND DEBRIDEMENT Bilateral 12/22/2021   Procedure: INCISION AND DEBRIDEMENT OF BILATERAL GROINS;  Surgeon: Nada Libman, MD;  Location: [facility] OR;  Service: Vascular;  Laterality: Bilateral;    Prior to Admission medications   Medication Sig Start Date End Date Taking? Authorizing Provider  allopurinol (ZYLOPRIM) 100 MG tablet Take 100 mg by mouth daily.   Yes [provider]  amiodarone (PACERONE) 200 MG tablet Take 200 mg by mouth daily.   Yes [provider]  aspirin EC 81 MG tablet Take 81 mg by mouth every morning.   Yes [provider]  chlorthalidone (HYGROTON) 25 MG tablet Take 25 mg by mouth daily.   Yes [provider]  Cholecalciferol (VITAMIN D3) 25 MCG (1000 UT) CAPS Take by mouth daily.   Yes [provider]  clopidogrel (PLAVIX) 75 MG tablet Take 1 tablet (75 mg total) by mouth daily. 01/16/23  Yes Baglia, Corrina, PA-C  docusate calcium (SURFAK) 240 MG capsule Take 240 mg by mouth daily.   Yes [provider]  ezetimibe (ZETIA) 10 MG tablet Take 1 tablet (10 mg total) by mouth daily. 04/10/18  Yes Gollan, Tollie Pizza, MD  fluticasone (FLONASE) 50 MCG/ACT nasal spray Place into both nostrils daily.   Yes [provider]  lisinopril (ZESTRIL) 10 MG tablet Take 10 mg by mouth daily.   Yes [provider]  pantoprazole (PROTONIX) 20 MG tablet Take 1 tablet (20 mg total) by mouth daily. 09/14/22 04/18/23 Yes Gillis Santa, MD  predniSONE (DELTASONE) 10 MG tablet Take 10 mg by mouth as needed.   Yes [provider]  simvastatin (ZOCOR) 20 MG tablet Take 20 mg by mouth in the morning.   Yes [provider]  zolpidem (AMBIEN) 5 MG tablet Take 10 mg by mouth at bedtime.   Yes [provider]  acetaminophen (TYLENOL) 325 MG tablet Take 2 tablets (650 mg total) by  mouth every 6 (six) hours as needed for mild pain (or Fever >/= 101). Patient not taking: Reported on 04/04/2023 02/22/19   Altamese Dilling, MD  albuterol (VENTOLIN HFA) 108 (90 Base) MCG/ACT inhaler Inhale into the lungs every 6 (six) hours as needed for wheezing or shortness of breath.    [provider]  iron polysaccharides (NIFEREX) 150 MG capsule Take 1 capsule (150 mg total) by mouth daily. 09/14/22 04/04/23  Gillis Santa, MD  naloxone [facility]) nasal spray 4 mg/0.1 mL Place 1 spray into the nose.    [provider]    Allergies as of 03/13/2023   (No Known Allergies)    Family History  Problem Relation Age of Onset   Heart disease Mother    Heart disease Brother     Social History   Socioeconomic History   Marital status: Married    Spouse name: Adelle   Number of children: 2   Years of education: Not on file   Highest education level: Not on file  Occupational History   Occupation: disabled  Tobacco Use   Smoking status: Former    Current packs/day: 0.00    Average packs/day: 1 pack/day for  47.0 years (47.0 ttl pk-yrs)    Types: Cigarettes    Start date: 80    Quit date: 2016    Years since quitting: 8.6    Passive exposure: Never   Smokeless tobacco: Never  Vaping Use   Vaping status: Never Used  Substance and Sexual Activity   Alcohol use: Not Currently   Drug use: No   Sexual activity: Not Currently  Other Topics Concern   Not on file  Social History Narrative   Not on file   Social Determinants of Health   Financial Resource Strain: Not on file  Food Insecurity: Not on file  Transportation Needs: Not on file  Physical Activity: Not on file  Stress: Not on file  Social Connections: Not on file  Intimate Partner Violence: Not on file    Review of Systems: See HPI, otherwise negative ROS  Physical Exam: BP (!) 140/67   Temp (!) 97.5 F (36.4 C) (Temporal)   Resp 18   Ht 5\' 9"  (1.753 m)   Wt 122.5 kg   SpO2 100%   BMI  39.87 kg/m  General:   Alert, cooperative in NAD Head:  Normocephalic and atraumatic. Respiratory:  Normal work of breathing. Cardiovascular:  RRR  Impression/Plan: Kevin Gross is here for cataract surgery.  Risks, benefits, limitations, and alternatives regarding cataract surgery have been reviewed with the patient.  Questions have been answered.  All parties agreeable.   Galen Manila, MD  04/18/2023, 12:00 PM

## 2023-04-18 NOTE — Op Note (Signed)
PREOPERATIVE DIAGNOSIS:  Nuclear sclerotic cataract of the right eye.   POSTOPERATIVE DIAGNOSIS:  Cataract   OPERATIVE PROCEDURE:ORPROCALL@   SURGEON:  Galen Manila, MD.   ANESTHESIA:  Anesthesiologist: Marisue Humble, MD CRNA: Barbette Hair, CRNA  1.      Managed anesthesia care. 2.      0.6ml of Shugarcaine was instilled in the eye following the paracentesis.   COMPLICATIONS:  None.   TECHNIQUE:   Stop and chop   DESCRIPTION OF PROCEDURE:  The patient was examined and consented in the preoperative holding area where the aforementioned topical anesthesia was applied to the right eye and then brought back to the Operating Room where the right eye was prepped and draped in the usual sterile ophthalmic fashion and a lid speculum was placed. A paracentesis was created with the side port blade and the anterior chamber was filled with viscoelastic. A near clear corneal incision was performed with the steel keratome. A continuous curvilinear capsulorrhexis was performed with a cystotome followed by the capsulorrhexis forceps. Hydrodissection and hydrodelineation were carried out with BSS on a blunt cannula. The lens was removed in a stop and chop  technique and the remaining cortical material was removed with the irrigation-aspiration handpiece. The capsular bag was inflated with viscoelastic and the Technis ZCB00  lens was placed in the capsular bag without complication. The remaining viscoelastic was removed from the eye with the irrigation-aspiration handpiece. The wounds were hydrated. The anterior chamber was flushed with BSS and the eye was inflated to physiologic pressure. 0.62ml of Vigamox was placed in the anterior chamber. The wounds were found to be water tight. The eye was dressed with Combigan. The patient was given protective glasses to wear throughout the day and a shield with which to sleep tonight. The patient was also given drops with which to begin a drop regimen today and will  follow-up with me in one day. Implant Name Type Inv. Item Serial No. Manufacturer Lot No. LRB No. Used Action  LENS IOL TECNIS EYHANCE 23.0 - [id number] Intraocular Lens LENS IOL TECNIS EYHANCE 23.0 [id number] [employer]  Right 1 Implanted   Procedure(s): CATARACT EXTRACTION PHACO AND INTRAOCULAR LENS PLACEMENT (IOC) RIGHT 4.31 00:8.2 (Right)  Electronically signed: Galen Manila 04/18/2023 12:29 PM

## 2023-04-19 ENCOUNTER — Encounter: Payer: Self-pay | Admitting: Ophthalmology

## 2023-07-03 ENCOUNTER — Other Ambulatory Visit: Payer: Self-pay | Admitting: Gastroenterology

## 2023-07-03 DIAGNOSIS — K7400 Hepatic fibrosis, unspecified: Secondary | ICD-10-CM

## 2023-07-10 ENCOUNTER — Ambulatory Visit
Admission: RE | Admit: 2023-07-10 | Discharge: 2023-07-10 | Disposition: A | Discharge status: Home / Self Care | Payer: Medicare PPO | Source: Ambulatory Visit | Attending: Gastroenterology | Admitting: Gastroenterology

## 2023-07-10 DIAGNOSIS — K7400 Hepatic fibrosis, unspecified: Secondary | ICD-10-CM | POA: Diagnosis present

## 2023-07-17 ENCOUNTER — Inpatient Hospital Stay: Payer: Medicare PPO | Attending: Oncology | Admitting: Oncology

## 2023-07-17 ENCOUNTER — Encounter: Payer: Self-pay | Admitting: Oncology

## 2023-07-17 ENCOUNTER — Inpatient Hospital Stay: Payer: Medicare PPO

## 2023-07-17 VITALS — BP 138/46 | HR 50 | Temp 96.5°F | Resp 18 | Ht 69.0 in | Wt 276.0 lb

## 2023-07-17 DIAGNOSIS — Z8546 Personal history of malignant neoplasm of prostate: Secondary | ICD-10-CM | POA: Diagnosis not present

## 2023-07-17 DIAGNOSIS — D509 Iron deficiency anemia, unspecified: Secondary | ICD-10-CM | POA: Diagnosis present

## 2023-07-17 DIAGNOSIS — Z87891 Personal history of nicotine dependence: Secondary | ICD-10-CM | POA: Diagnosis not present

## 2023-07-17 NOTE — Progress Notes (Signed)
[facility]  [phone]) [phone] [phone]) [phone]  ID: Charyl Dancer OB: 1955-12-24  MR#: [id number]  IHK#:[id number]  Patient Care Team: Gildardo Pounds, PA as PCP - General (Physician Assistant) Antonieta Iba, MD as PCP - Cardiology (Cardiology) Jeralyn Ruths, MD as Consulting Physician (Oncology)  CHIEF COMPLAINT: Iron deficiency anemia.  INTERVAL HISTORY: Patient is a 67 year old male who was noted to have a declining hemoglobin and iron stores on routine blood work.  He currently feels well and is asymptomatic.  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  Patient reports he quit smoking in 2016.  He does not complain of any weakness or fatigue.  He has no neurologic complaints.  He denies any recent fevers or illnesses.  He has a good appetite and denies weight loss.  He has no chest pain, shortness of breath, cough, or hemoptysis.  He denies any nausea, vomiting, constipation, or diarrhea.  He has no urinary complaints.  Patient feels at his baseline and offers no specific complaints today.  REVIEW OF SYSTEMS:   Review of Systems  Constitutional: Negative.  Negative for fever, malaise/fatigue and weight loss.  Respiratory: Negative.  Negative for cough, hemoptysis and shortness of breath.   Cardiovascular: Negative.  Negative for chest pain and leg swelling.  Gastrointestinal: Negative.  Negative for abdominal pain, blood in stool and melena.  Genitourinary: Negative.  Negative for hematuria.  Musculoskeletal: Negative.  Negative for back pain.  Skin: Negative.   Neurological: Negative.  Negative for dizziness, focal weakness, weakness and headaches.  Psychiatric/Behavioral: Negative.  The patient is not nervous/anxious.     As per HPI. Otherwise, a complete review of systems is negative.  PAST MEDICAL HISTORY: Past Medical History:  Diagnosis  Date   Alcoholic cirrhosis of liver (HCC)    Anemia 2024   Cataract 2024   CHF (congestive heart failure) (HCC)    CKD (chronic kidney disease), stage III (HCC)    Clotting disorder (HCC)    Bilateral legs-pt states plaque build up in both legs requiring intervention   Complication of anesthesia    pt states that years ago during colonoscopy at [facility] they had to abort the procedure due to some "cardiac issue" had to see cardiologist   COPD (chronic obstructive pulmonary disease) (HCC)    Hyperlipidemia    Hypertension    Pre-diabetes    Prostate cancer (HCC) 2023   Sleep apnea    uses cpap    PAST SURGICAL HISTORY: Past Surgical History:  Procedure Laterality Date   ABDOMINAL AORTOGRAM W/LOWER EXTREMITY N/A 10/20/2021   Procedure: ABDOMINAL AORTOGRAM W/LOWER EXTREMITY;  Surgeon: Iran Ouch, MD;  Location: [facility];  Service: Cardiovascular;  Laterality: N/A;   ALLOGRAFT APPLICATION Bilateral 12/22/2021   Procedure: ALLOGRAFT APPLICATION;  Surgeon: Nada Libman, MD;  Location: [facility] OR;  Service: Vascular;  Laterality: Bilateral;   ANGIOPLASTY Left 11/26/2021   Procedure: LEFT INFRARENAL LITHOTRIPSY;  Surgeon: Nada Libman, MD;  Location: [facility] OR;  Service: Vascular;  Laterality: Left;   APPLICATION OF WOUND VAC Bilateral 12/13/2021   Procedure: APPLICATION OF WOUND VAC;  Surgeon: Victorino Sparrow, MD;  Location: [facility] OR;  Service: Vascular;  Laterality: Bilateral;   CARDIAC CATHETERIZATION     [facility]   CATARACT EXTRACTION W/PHACO Left 04/04/2023   Procedure: CATARACT EXTRACTION PHACO AND INTRAOCULAR LENS PLACEMENT (IOC) LEFT 8.71 00:45.5;  Surgeon: Druscilla Brownie,  Chrissie Noa, MD;  Location: [facility];  Service: Ophthalmology;  Laterality: Left;   CATARACT EXTRACTION W/PHACO Right 04/18/2023   Procedure: CATARACT EXTRACTION PHACO AND INTRAOCULAR LENS PLACEMENT (IOC) RIGHT 4.31 00:8.2;  Surgeon: Galen Manila, MD;  Location: [facility];  Service: Ophthalmology;   Laterality: Right;   COLONOSCOPY     COLONOSCOPY WITH PROPOFOL N/A 01/17/2018   Procedure: COLONOSCOPY WITH PROPOFOL;  Surgeon: Toledo, Boykin Nearing, MD;  Location: [facility] ENDOSCOPY;  Service: Gastroenterology;  Laterality: N/A;   COLONOSCOPY WITH PROPOFOL N/A 09/12/2022   Procedure: COLONOSCOPY WITH PROPOFOL;  Surgeon: Regis Bill, MD;  Location: [facility] ENDOSCOPY;  Service: Endoscopy;  Laterality: N/A;   ENDARTERECTOMY FEMORAL Bilateral 11/26/2021   Procedure: BILATERAL FEMORAL ENDARTERECTOMY WITH VEIN PATCH ANGIOPLASTY;  Surgeon: Nada Libman, MD;  Location: [facility] OR;  Service: Vascular;  Laterality: Bilateral;   ESOPHAGOGASTRODUODENOSCOPY (EGD) WITH PROPOFOL N/A 01/17/2018   Procedure: ESOPHAGOGASTRODUODENOSCOPY (EGD) WITH PROPOFOL;  Surgeon: Toledo, Boykin Nearing, MD;  Location: [facility] ENDOSCOPY;  Service: Gastroenterology;  Laterality: N/A;   ESOPHAGOGASTRODUODENOSCOPY (EGD) WITH PROPOFOL N/A 09/12/2022   Procedure: ESOPHAGOGASTRODUODENOSCOPY (EGD) WITH PROPOFOL;  Surgeon: Regis Bill, MD;  Location: [facility] ENDOSCOPY;  Service: Endoscopy;  Laterality: N/A;   GROIN DEBRIDEMENT Bilateral 12/16/2021   Procedure: IRRIGATION AND DEBRIDEMENT OF BILATERAL GROINS;  Surgeon: Nada Libman, MD;  Location: [facility] OR;  Service: Vascular;  Laterality: Bilateral;   INCISION AND DRAINAGE OF WOUND Bilateral 12/13/2021   Procedure: IRRIGATION AND DEBRIDEMENT OF BILATERAL GROIN WOUNDS;  Surgeon: Victorino Sparrow, MD;  Location: [facility] OR;  Service: Vascular;  Laterality: Bilateral;   INSERTION OF ILIAC STENT Bilateral 11/26/2021   Procedure: BILATERAL ILIAC STENTING USING 36mmX80mm INNOVA STENT ON LEFT ILIAC AND 49mmX59mm, 39mmX79mm VBX AND 57mmX7.5cm VIABAHN STENT ON RIGHT ILIAC;  Surgeon: Nada Libman, MD;  Location: [facility] OR;  Service: Vascular;  Laterality: Bilateral;   ORIF ANKLE FRACTURE Right    PROSTATE BIOPSY N/A 04/01/2021   Procedure: PROSTATE BIOPSY Addison Bailey;  Surgeon: Orson Ape, MD;  Location: [facility] ORS;   Service: Urology;  Laterality: N/A;   VEIN HARVEST Bilateral 11/26/2021   Procedure: VEIN HARVEST OF BILATERAL SPAHENOUS VEINS;  Surgeon: Nada Libman, MD;  Location: [facility] OR;  Service: Vascular;  Laterality: Bilateral;   WOUND DEBRIDEMENT Bilateral 12/22/2021   Procedure: INCISION AND DEBRIDEMENT OF BILATERAL GROINS;  Surgeon: Nada Libman, MD;  Location: [facility] OR;  Service: Vascular;  Laterality: Bilateral;    FAMILY HISTORY: Family History  Problem Relation Age of Onset   Heart disease Mother    Heart disease Brother     ADVANCED DIRECTIVES (Y/N):  N  HEALTH MAINTENANCE: Social History   Tobacco Use   Smoking status: Former    Current packs/day: 0.00    Average packs/day: 1 pack/day for 47.0 years (47.0 ttl pk-yrs)    Types: Cigarettes    Start date: 36    Quit date: 2016    Years since quitting: 8.8    Passive exposure: Never   Smokeless tobacco: Never  Vaping Use   Vaping status: Never Used  Substance Use Topics   Alcohol use: Not Currently   Drug use: No     Colonoscopy:  PAP:  Bone density:  Lipid panel:  No Known Allergies  Current Outpatient Medications  Medication Sig Dispense Refill   albuterol (VENTOLIN HFA) 108 (90 Base) MCG/ACT inhaler Inhale into the lungs every 6 (six) hours as needed for wheezing or shortness of breath.  allopurinol (ZYLOPRIM) 100 MG tablet Take 100 mg by mouth daily.     amiodarone (PACERONE) 200 MG tablet Take 200 mg by mouth daily.     aspirin EC 81 MG tablet Take 81 mg by mouth every morning.     chlorthalidone (HYGROTON) 25 MG tablet Take 25 mg by mouth daily.     Cholecalciferol (VITAMIN D3) 25 MCG (1000 UT) CAPS Take by mouth daily.     clopidogrel (PLAVIX) 75 MG tablet Take 1 tablet (75 mg total) by mouth daily. 30 tablet 11   docusate calcium (SURFAK) 240 MG capsule Take 240 mg by mouth daily.     ezetimibe (ZETIA) 10 MG tablet Take 1 tablet (10 mg total) by mouth daily. 90 tablet 3   iron polysaccharides (NIFEREX)  150 MG capsule Take 1 capsule (150 mg total) by mouth daily. 90 capsule 0   lisinopril (ZESTRIL) 10 MG tablet Take 10 mg by mouth daily.     naloxone (NARCAN) nasal spray 4 mg/0.1 mL Place 1 spray into the nose.     predniSONE (DELTASONE) 10 MG tablet Take 10 mg by mouth as needed.     simvastatin (ZOCOR) 20 MG tablet Take 20 mg by mouth in the morning.     zolpidem (AMBIEN) 5 MG tablet Take 10 mg by mouth at bedtime.     acetaminophen (TYLENOL) 325 MG tablet Take 2 tablets (650 mg total) by mouth every 6 (six) hours as needed for mild pain (or Fever >/= 101). (Patient not taking: Reported on 04/04/2023) 30 tablet 0   fluticasone (FLONASE) 50 MCG/ACT nasal spray Place into both nostrils daily. (Patient not taking: Reported on 07/17/2023)     pantoprazole (PROTONIX) 20 MG tablet Take 1 tablet (20 mg total) by mouth daily. (Patient not taking: Reported on 07/17/2023) 30 tablet 2   No current facility-administered medications for this visit.    OBJECTIVE: Vitals:   07/17/23 1102  BP: (!) 138/46  Pulse: (!) 50  Resp: 18  Temp: (!) 96.5 F (35.8 C)  SpO2: 98%     Body mass index is 40.76 kg/m.    ECOG FS:0 - Asymptomatic  General: Well-developed, well-nourished, no acute distress. Eyes: Pink conjunctiva, anicteric sclera. HEENT: Normocephalic, moist mucous membranes. Lungs: No audible wheezing or coughing. Heart: Regular rate and rhythm. Abdomen: Soft, nontender, no obvious distention. Musculoskeletal: No edema, cyanosis, or clubbing. Neuro: Alert, answering all questions appropriately. Cranial nerves grossly intact. Skin: No rashes or petechiae noted. Psych: Normal affect. Lymphatics: No cervical, calvicular, axillary or inguinal LAD.   LAB RESULTS:  Lab Results  Component Value Date   NA 138 09/13/2022   K 4.5 09/13/2022   CL 106 09/13/2022   CO2 25 09/13/2022   GLUCOSE 92 09/13/2022   BUN 30 (H) 09/13/2022   CREATININE 1.96 (H) 09/13/2022   CALCIUM 8.9 09/13/2022   PROT  7.0 09/09/2022   ALBUMIN 3.8 09/09/2022   AST 17 09/09/2022   ALT 14 09/09/2022   ALKPHOS 46 09/09/2022   BILITOT 0.5 09/09/2022   GFRNONAA 37 (L) 09/13/2022   GFRAA 56 (L) 10/26/2019    Lab Results  Component Value Date   WBC 6.5 09/13/2022   NEUTROABS 4.6 12/12/2021   HGB 7.8 (L) 09/13/2022   HCT 27.4 (L) 09/13/2022   MCV 80.4 09/13/2022   PLT 246 09/13/2022   Lab Results  Component Value Date   IRON 19 (L) 09/09/2022   TIBC 461 (H) 09/09/2022   IRONPCTSAT 4 (L) 09/09/2022  Lab Results  Component Value Date   FERRITIN 4 (L) 09/09/2022     STUDIES: US Abdomen Limited RUQ (LIVER/GB)  Result Date: 07/10/2023 CLINICAL DATA:  HCC screening EXAM: ULTRASOUND ABDOMEN LIMITED RIGHT UPPER QUADRANT COMPARISON:  Chest CT 10/27/2022 FINDINGS: Gallbladder: No gallstones or wall thickening visualized. No sonographic Murphy sign noted by sonographer. Common bile duct: Diameter: 2.2 mm Liver: Coarsened echogenicity and nodular contour. No focal lesion. Portal vein is patent on color Doppler imaging with normal direction of blood flow towards the liver. Other: None. IMPRESSION: Cirrhotic morphology of the liver. No focal lesion. Electronically Signed   By: Annia Belt M.D.   On: 07/10/2023 09:55    ASSESSMENT: Iron deficiency anemia.  PLAN:    Iron deficiency anemia: Patient noted to have significantly reduced hemoglobin and iron stores.  Colonoscopy and EGD in January 2024 did not reveal any significant pathology.  Plan is to repeat in January 2025.  Patient return to clinic 5 times over the next 2 to 3 weeks to receive 200 mg IV Venofer.  He would then return to clinic in 4 months with repeat laboratory work, further evaluation, and continuation of treatment if needed.  I spent a total of 45 minutes reviewing chart data, face-to-face evaluation with the patient, counseling and coordination of care as detailed above.   Patient expressed understanding and was in agreement with this  plan. He also understands that He can call clinic at any time with any questions, concerns, or complaints.    Jeralyn Ruths, MD   07/17/2023 1:19 PM

## 2023-07-18 ENCOUNTER — Ambulatory Visit (HOSPITAL_COMMUNITY)
Admission: RE | Admit: 2023-07-18 | Discharge: 2023-07-18 | Disposition: A | Discharge status: Home / Self Care | Payer: Medicare PPO | Source: Ambulatory Visit | Attending: Surgery | Admitting: Surgery

## 2023-07-18 ENCOUNTER — Ambulatory Visit (INDEPENDENT_AMBULATORY_CARE_PROVIDER_SITE_OTHER)
Admission: RE | Admit: 2023-07-18 | Discharge: 2023-07-18 | Disposition: A | Discharge status: Home / Self Care | Payer: Medicare PPO | Source: Ambulatory Visit | Attending: Vascular Surgery

## 2023-07-18 ENCOUNTER — Ambulatory Visit: Payer: Medicare PPO | Admitting: Physician Assistant

## 2023-07-18 VITALS — BP 121/46 | HR 48 | Temp 97.6°F | Resp 18 | Ht 70.0 in | Wt 275.3 lb

## 2023-07-18 DIAGNOSIS — I739 Peripheral vascular disease, unspecified: Secondary | ICD-10-CM | POA: Diagnosis not present

## 2023-07-18 DIAGNOSIS — I70213 Atherosclerosis of native arteries of extremities with intermittent claudication, bilateral legs: Secondary | ICD-10-CM | POA: Insufficient documentation

## 2023-07-18 LAB — VAS US ABI WITH/WO TBI: Right ABI: 1.14

## 2023-07-18 NOTE — Progress Notes (Signed)
HISTORY AND PHYSICAL     CC:  follow up. Requesting Provider:  Gildardo Pounds, PA  HPI: This is a 66 y.o. male who is here today for follow up for PAD.  Pt has hx of: #1: Right iliofemoral endarterectomy with vein patch angioplasty #2: Left iliofemoral endarterectomy with vein patch angioplasty #3: Stent, left common iliac artery #4: Stent, left external iliac artery #5: Intra-arterial lithotripsy: Left common and external iliac arteries #6: Intra-arterial lithotripsy: Right common and external iliac arteries #7: Stent, right external iliac artery #8: Stent, right common femoral artery   Pt was last seen 01/16/2023 and at that time, he was not having any claudication, rest pain or wounds.  His only concern was suprapubic fullness since surgery.  He states he did have the u/s but has not heard the results.  He has upper and lower endoscopy scheduled for January.  He is seeing hematologist for low iron.  He has had cataract surgery since he was last here.   The pt returns today for follow up.  He denies any claudication, rest pain or ulcerations.  He does have superficial veins on both ankles and denies any bleeding from them.   The pt is on a statin for cholesterol management.    The pt is on an aspirin.    Other AC:  Plavix The pt is on ACEI for hypertension.  The pt is not on medication for diabetes. Tobacco hx:  former  Pt does not have family hx of AAA.  Past Medical History:  Diagnosis Date   Alcoholic cirrhosis of liver (HCC)    Anemia 2024   Cataract 2024   CHF (congestive heart failure) (HCC)    CKD (chronic kidney disease), stage III (HCC)    Clotting disorder (HCC)    Bilateral legs-pt states plaque build up in both legs requiring intervention   Complication of anesthesia    pt states that years ago during colonoscopy at [facility] they had to abort the procedure due to some "cardiac issue" had to see cardiologist   COPD (chronic obstructive pulmonary disease) (HCC)     Hyperlipidemia    Hypertension    Pre-diabetes    Prostate cancer (HCC) 2023   Sleep apnea    uses cpap    Past Surgical History:  Procedure Laterality Date   ABDOMINAL AORTOGRAM W/LOWER EXTREMITY N/A 10/20/2021   Procedure: ABDOMINAL AORTOGRAM W/LOWER EXTREMITY;  Surgeon: Iran Ouch, MD;  Location: [facility];  Service: Cardiovascular;  Laterality: N/A;   ALLOGRAFT APPLICATION Bilateral 12/22/2021   Procedure: ALLOGRAFT APPLICATION;  Surgeon: Nada Libman, MD;  Location: [facility] OR;  Service: Vascular;  Laterality: Bilateral;   ANGIOPLASTY Left 11/26/2021   Procedure: LEFT INFRARENAL LITHOTRIPSY;  Surgeon: Nada Libman, MD;  Location: [facility] OR;  Service: Vascular;  Laterality: Left;   APPLICATION OF WOUND VAC Bilateral 12/13/2021   Procedure: APPLICATION OF WOUND VAC;  Surgeon: Victorino Sparrow, MD;  Location: [facility] OR;  Service: Vascular;  Laterality: Bilateral;   CARDIAC CATHETERIZATION     [facility]   CATARACT EXTRACTION W/PHACO Left 04/04/2023   Procedure: CATARACT EXTRACTION PHACO AND INTRAOCULAR LENS PLACEMENT (IOC) LEFT 8.71 00:45.5;  Surgeon: Galen Manila, MD;  Location: [facility];  Service: Ophthalmology;  Laterality: Left;   CATARACT EXTRACTION W/PHACO Right 04/18/2023   Procedure: CATARACT EXTRACTION PHACO AND INTRAOCULAR LENS PLACEMENT (IOC) RIGHT 4.31 00:8.2;  Surgeon: Galen Manila, MD;  Location: [facility];  Service: Ophthalmology;  Laterality: Right;  COLONOSCOPY     COLONOSCOPY WITH PROPOFOL N/A 01/17/2018   Procedure: COLONOSCOPY WITH PROPOFOL;  Surgeon: Toledo, Boykin Nearing, MD;  Location: [facility] ENDOSCOPY;  Service: Gastroenterology;  Laterality: N/A;   COLONOSCOPY WITH PROPOFOL N/A 09/12/2022   Procedure: COLONOSCOPY WITH PROPOFOL;  Surgeon: Regis Bill, MD;  Location: [facility] ENDOSCOPY;  Service: Endoscopy;  Laterality: N/A;   ENDARTERECTOMY FEMORAL Bilateral 11/26/2021   Procedure: BILATERAL FEMORAL ENDARTERECTOMY WITH VEIN PATCH  ANGIOPLASTY;  Surgeon: Nada Libman, MD;  Location: [facility] OR;  Service: Vascular;  Laterality: Bilateral;   ESOPHAGOGASTRODUODENOSCOPY (EGD) WITH PROPOFOL N/A 01/17/2018   Procedure: ESOPHAGOGASTRODUODENOSCOPY (EGD) WITH PROPOFOL;  Surgeon: Toledo, Boykin Nearing, MD;  Location: [facility] ENDOSCOPY;  Service: Gastroenterology;  Laterality: N/A;   ESOPHAGOGASTRODUODENOSCOPY (EGD) WITH PROPOFOL N/A 09/12/2022   Procedure: ESOPHAGOGASTRODUODENOSCOPY (EGD) WITH PROPOFOL;  Surgeon: Regis Bill, MD;  Location: [facility] ENDOSCOPY;  Service: Endoscopy;  Laterality: N/A;   GROIN DEBRIDEMENT Bilateral 12/16/2021   Procedure: IRRIGATION AND DEBRIDEMENT OF BILATERAL GROINS;  Surgeon: Nada Libman, MD;  Location: [facility] OR;  Service: Vascular;  Laterality: Bilateral;   INCISION AND DRAINAGE OF WOUND Bilateral 12/13/2021   Procedure: IRRIGATION AND DEBRIDEMENT OF BILATERAL GROIN WOUNDS;  Surgeon: Victorino Sparrow, MD;  Location: [facility] OR;  Service: Vascular;  Laterality: Bilateral;   INSERTION OF ILIAC STENT Bilateral 11/26/2021   Procedure: BILATERAL ILIAC STENTING USING 21mmX80mm INNOVA STENT ON LEFT ILIAC AND 44mmX59mm, 18mmX79mm VBX AND 50mmX7.5cm VIABAHN STENT ON RIGHT ILIAC;  Surgeon: Nada Libman, MD;  Location: [facility] OR;  Service: Vascular;  Laterality: Bilateral;   ORIF ANKLE FRACTURE Right    PROSTATE BIOPSY N/A 04/01/2021   Procedure: PROSTATE BIOPSY Addison Bailey;  Surgeon: Orson Ape, MD;  Location: [facility] ORS;  Service: Urology;  Laterality: N/A;   VEIN HARVEST Bilateral 11/26/2021   Procedure: VEIN HARVEST OF BILATERAL SPAHENOUS VEINS;  Surgeon: Nada Libman, MD;  Location: [facility] OR;  Service: Vascular;  Laterality: Bilateral;   WOUND DEBRIDEMENT Bilateral 12/22/2021   Procedure: INCISION AND DEBRIDEMENT OF BILATERAL GROINS;  Surgeon: Nada Libman, MD;  Location: [facility] OR;  Service: Vascular;  Laterality: Bilateral;    No Known Allergies  Current Outpatient Medications  Medication Sig Dispense Refill    acetaminophen (TYLENOL) 325 MG tablet Take 2 tablets (650 mg total) by mouth every 6 (six) hours as needed for mild pain (or Fever >/= 101). (Patient not taking: Reported on 04/04/2023) 30 tablet 0   albuterol (VENTOLIN HFA) 108 (90 Base) MCG/ACT inhaler Inhale into the lungs every 6 (six) hours as needed for wheezing or shortness of breath.     allopurinol (ZYLOPRIM) 100 MG tablet Take 100 mg by mouth daily.     amiodarone (PACERONE) 200 MG tablet Take 200 mg by mouth daily.     aspirin EC 81 MG tablet Take 81 mg by mouth every morning.     chlorthalidone (HYGROTON) 25 MG tablet Take 25 mg by mouth daily.     Cholecalciferol (VITAMIN D3) 25 MCG (1000 UT) CAPS Take by mouth daily.     clopidogrel (PLAVIX) 75 MG tablet Take 1 tablet (75 mg total) by mouth daily. 30 tablet 11   docusate calcium (SURFAK) 240 MG capsule Take 240 mg by mouth daily.     ezetimibe (ZETIA) 10 MG tablet Take 1 tablet (10 mg total) by mouth daily. 90 tablet 3   fluticasone (FLONASE) 50 MCG/ACT nasal spray Place into both nostrils daily. (Patient not taking: Reported on 07/17/2023)  iron polysaccharides (NIFEREX) 150 MG capsule Take 1 capsule (150 mg total) by mouth daily. 90 capsule 0   lisinopril (ZESTRIL) 10 MG tablet Take 10 mg by mouth daily.     naloxone (NARCAN) nasal spray 4 mg/0.1 mL Place 1 spray into the nose.     pantoprazole (PROTONIX) 20 MG tablet Take 1 tablet (20 mg total) by mouth daily. (Patient not taking: Reported on 07/17/2023) 30 tablet 2   predniSONE (DELTASONE) 10 MG tablet Take 10 mg by mouth as needed.     simvastatin (ZOCOR) 20 MG tablet Take 20 mg by mouth in the morning.     zolpidem (AMBIEN) 5 MG tablet Take 10 mg by mouth at bedtime.     No current facility-administered medications for this visit.    Family History  Problem Relation Age of Onset   Heart disease Mother    Heart disease Brother     Social History   Socioeconomic History   Marital status: Married    Spouse name:  Adelle   Number of children: 2   Years of education: Not on file   Highest education level: Not on file  Occupational History   Occupation: disabled  Tobacco Use   Smoking status: Former    Current packs/day: 0.00    Average packs/day: 1 pack/day for 47.0 years (47.0 ttl pk-yrs)    Types: Cigarettes    Start date: [birth year]    Quit date: 2016    Years since quitting: 8.8    Passive exposure: Never   Smokeless tobacco: Never  Vaping Use   Vaping status: Never Used  Substance and Sexual Activity   Alcohol use: Not Currently   Drug use: No   Sexual activity: Not Currently    Birth control/protection: Other-see comments    Comment: ED  Other Topics Concern   Not on file  Social History Narrative   Not on file   Social Determinants of Health   Financial Resource Strain: Low Risk  (07/17/2023)   Overall Financial Resource Strain (CARDIA)    Difficulty of Paying Living Expenses: Not hard at all  Food Insecurity: No Food Insecurity (07/17/2023)   Hunger Vital Sign    Worried About Running Out of Food in the Last Year: Never true    Ran Out of Food in the Last Year: Never true  Transportation Needs: No Transportation Needs (07/17/2023)   PRAPARE - Administrator, Civil Service (Medical): No    Lack of Transportation (Non-Medical): No  Physical Activity: Not on file  Stress: Not on file  Social Connections: Not on file  Intimate Partner Violence: Not At Risk (07/17/2023)   Humiliation, Afraid, Rape, and Kick questionnaire    Fear of Current or Ex-Partner: No    Emotionally Abused: No    Physically Abused: No    Sexually Abused: No     REVIEW OF SYSTEMS:   [X]  denotes positive finding, [ ]  denotes negative finding Cardiac  Comments:  Chest pain or chest pressure:    Shortness of breath upon exertion:    Short of breath when lying flat:    Irregular heart rhythm:        Vascular    Pain in calf, thigh, or hip brought on by ambulation:    Pain in feet at  night that wakes you up from your sleep:     Blood clot in your veins:    Leg swelling:  Pulmonary    Oxygen at home:    Productive cough:     Wheezing:         Neurologic    Sudden weakness in arms or legs:     Sudden numbness in arms or legs:     Sudden onset of difficulty speaking or slurred speech:    Temporary loss of vision in one eye:     Problems with dizziness:         Gastrointestinal    Blood in stool:     Vomited blood:         Genitourinary    Burning when urinating:     Blood in urine:        Psychiatric    Major depression:         Hematologic    Bleeding problems:    Problems with blood clotting too easily:        Skin    Rashes or ulcers:        Constitutional    Fever or chills:      PHYSICAL EXAMINATION:  Today's Vitals   07/18/23 0923  BP: (!) 121/46  Pulse: (!) 48  Resp: 18  Temp: 97.6 F (36.4 C)  TempSrc: Temporal  SpO2: 97%  Weight: 275 lb 4.8 oz (124.9 kg)  Height: 5\' 10"  (1.778 m)  PainSc: 7   PainLoc: Back   Body mass index is 39.5 kg/m.   General:  WDWN in NAD; vital signs documented above Gait: Not observed HENT: WNL, normocephalic Pulmonary: normal non-labored breathing , without wheezing Cardiac: regular HR, without carotid bruits Abdomen: soft, NT; aortic pulse is not palpable Skin: without rashes Vascular Exam/Pulses:  Right Left  Radial 2+ (normal) 2+ (normal)  Femoral 2+ (normal) Unable to palpate  DP monophasic monophasic  PT monophasic monophasic  Peroneal monophasic monophasic   Extremities: without ischemic changes, without Gangrene , without cellulitis; without open wounds; + superficial varicosities bilateral ankles.  Musculoskeletal: no muscle wasting or atrophy  Neurologic: A&O X 3 Psychiatric:  The pt has Normal affect.   Non-Invasive Vascular Imaging:   ABI's/TBI's on 07/18/2023: Right:  1.14/0.51 - Great toe pressure: 72 Left:  1.08/0.40 - Great toe pressure: 56  Arterial duplex on  07/18/2023: Abdominal Aorta Findings:  +--------+-------+----------+----------+---------+--------+--------+  LocationAP (cm)Trans (cm)PSV (cm/s)Waveform ThrombusComments  +--------+-------+----------+----------+---------+--------+--------+  Proximal                83        triphasic                  +--------+-------+----------+----------+---------+--------+--------+  Mid                     106                                  +--------+-------+----------+----------+---------+--------+--------+  Distal                  133       triphasic                  +--------+-------+----------+----------+---------+--------+--------+   Visualization of the Proximal Abdominal Aorta and Mid Abdominal Aorta was  limited.   Right Stent(s):  +-----------------------+--------+---------------+---------+------------+  Right CIA and EIA stentPSV cm/sStenosis       Waveform Comments      +-----------------------+--------+---------------+---------+------------+  Prox to Stent  133                    triphasicDistal Ao     +-----------------------+--------+---------------+---------+------------+  Proximal Stent         144                    triphasic              +-----------------------+--------+---------------+---------+------------+  Mid Stent              263     50-99% stenosistriphasic              +-----------------------+--------+---------------+---------+------------+  Distal Stent           305     50-99% stenosistriphasic              +-----------------------+--------+---------------+---------+------------+  Distal to Stent        275     50-99% stenosistriphasicProximal CFA  +-----------------------+--------+---------------+---------+------------+      Left Stent(s):  +-----------------------+--------+---------------+---------+---------+  Right CIA and EIA stentPSV cm/sStenosis       Waveform Comments    +-----------------------+--------+---------------+---------+---------+  Prox to Stent          133                    triphasicDistal Ao  +-----------------------+--------+---------------+---------+---------+  Proximal Stent         176     1-49% stenosis triphasic           +-----------------------+--------+---------------+---------+---------+  Mid Stent              197     1-49% stenosis triphasic           +-----------------------+--------+---------------+---------+---------+  Distal Stent           275     50-99% stenosistriphasic           +-----------------------+--------+---------------+---------+---------+  Distal to Stent        96      1-49% stenosis triphasic           +-----------------------+--------+---------------+---------+---------+   Summary:  Stenosis: +--------------------+---------------+  Location            Stent            +--------------------+---------------+  Right Common Iliac  50-99% stenosis  +--------------------+---------------+  Left Common Iliac   1-49% stenosis   +--------------------+---------------+  Right External Iliac50-99% stenosis  +--------------------+---------------+  Left External Iliac 50-99% stenosis  +--------------------+---------------+    Previous ABI's/TBI's on 01/16/2023: Right:  1.20/0.59 - Great toe pressure: 88 Left:  Benld/0.79 - Great toe pressure:  117  Previous arterial duplex on 01/16/2023: Right Stent(s):  +-----------------------+--------+---------------+---------+--------+  Right CIA and EIA stentPSV cm/sStenosis       Waveform Comments  +-----------------------+--------+---------------+---------+--------+  Prox to Stent          178                    triphasic          +-----------------------+--------+---------------+---------+--------+  Proximal Stent         149                    triphasic           +-----------------------+--------+---------------+---------+--------+  Mid Stent              169                    triphasic          +-----------------------+--------+---------------+---------+--------+  Distal Stent           229     50-99% stenosistriphasic          +-----------------------+--------+---------------+---------+--------+  Distal to Stent        190                    triphasic          +-----------------------+--------+---------------+---------+--------+         Left Stent(s):  +----------------------+--------+---------------+---------+--------+  Left CIA and EIA stentPSV cm/sStenosis       Waveform Comments  +----------------------+--------+---------------+---------+--------+  Prox to Stent         178                    triphasic          +----------------------+--------+---------------+---------+--------+  Proximal Stent        258     50-99% stenosistriphasic          +----------------------+--------+---------------+---------+--------+  Mid Stent             233     50-99% stenosistriphasic          +----------------------+--------+---------------+---------+--------+  Distal Stent          157                    biphasic           +----------------------+--------+---------------+---------+--------+  Distal to Stent       114                    biphasic           +----------------------+--------+---------------+---------+--------+   Left CFA is dilated with proximal 0.87 x 0.80 cm diameter increasing to  2.3 x 2.6cm diameter at the mid CFA level.     Stenosis: +--------------------+---------------+  Location            Stent            +--------------------+---------------+  Right Common Iliac  no stenosis      +--------------------+---------------+  Left Common Iliac   50-99% stenosis  +--------------------+---------------+  Right External Iliac50-99% stenosis   +--------------------+---------------+  Left External Iliac 50-99% stenosis  +--------------------+---------------+   Left CFA is dilated with proximal 0.87 x 0.80 cm diameter increasing to  2.3 x 2.6cm diameter at the mid CFA level.     ASSESSMENT/PLAN:: 66 y.o. male here for follow up for PAD with extensive revascularization hx of: #1: Right iliofemoral endarterectomy with vein patch angioplasty #2: Left iliofemoral endarterectomy with vein patch angioplasty #3: Stent, left common iliac artery #4: Stent, left external iliac artery #5: Intra-arterial lithotripsy: Left common and external iliac arteries #6: Intra-arterial lithotripsy: Right common and external iliac arteries #7: Stent, right external iliac artery #8: Stent, right common femoral artery   -pt with monophasic doppler signal bilateral DP/PT/peroneal.  He is not having any claudication, rest pain or non healing wounds, however, I am unable to palpate is left femoral pulse and he has had a significant drop in his TBI and toe pressure on the left and increased velocities from prior visit.  He also has increased velocity in the distal right iliac stent.   Given the above, I discussed with pt about proceeding with arteriogram to evaluate. He does have a palpable right femoral pulse.  He is in agreement to proceed.   -continue plavix/statin -will schedule at time that is convenient with Dr. Myra Gianotti and pt.  Doreatha Massed, [facility] [phone]  Clinic MD:   Karin Lieu

## 2023-07-18 NOTE — H&P (View-Only) (Signed)
 HISTORY AND PHYSICAL     CC:  follow up. Requesting Provider:  Gildardo Pounds, PA  HPI: This is a 66 y.o. male who is here today for follow up for PAD.  Pt has hx of: #1: Right iliofemoral endarterectomy with vein patch angioplasty #2: Left iliofemoral endarterectomy with vein patch angioplasty #3: Stent, left common iliac artery #4: Stent, left external iliac artery #5: Intra-arterial lithotripsy: Left common and external iliac arteries #6: Intra-arterial lithotripsy: Right common and external iliac arteries #7: Stent, right external iliac artery #8: Stent, right common femoral artery   Pt was last seen 01/16/2023 and at that time, he was not having any claudication, rest pain or wounds.  His only concern was suprapubic fullness since surgery.  He states he did have the u/s but has not heard the results.  He has upper and lower endoscopy scheduled for January.  He is seeing hematologist for low iron.  He has had cataract surgery since he was last here.   The pt returns today for follow up.  He denies any claudication, rest pain or ulcerations.  He does have superficial veins on both ankles and denies any bleeding from them.   The pt is on a statin for cholesterol management.    The pt is on an aspirin.    Other AC:  Plavix The pt is on ACEI for hypertension.  The pt is not on medication for diabetes. Tobacco hx:  former  Pt does not have family hx of AAA.  Past Medical History:  Diagnosis Date   Alcoholic cirrhosis of liver (HCC)    Anemia 2024   Cataract 2024   CHF (congestive heart failure) (HCC)    CKD (chronic kidney disease), stage III (HCC)    Clotting disorder (HCC)    Bilateral legs-pt states plaque build up in both legs requiring intervention   Complication of anesthesia    pt states that years ago during colonoscopy at [facility] they had to abort the procedure due to some "cardiac issue" had to see cardiologist   COPD (chronic obstructive pulmonary disease) (HCC)     Hyperlipidemia    Hypertension    Pre-diabetes    Prostate cancer (HCC) 2023   Sleep apnea    uses cpap    Past Surgical History:  Procedure Laterality Date   ABDOMINAL AORTOGRAM W/LOWER EXTREMITY N/A 10/20/2021   Procedure: ABDOMINAL AORTOGRAM W/LOWER EXTREMITY;  Surgeon: Iran Ouch, MD;  Location: [facility];  Service: Cardiovascular;  Laterality: N/A;   ALLOGRAFT APPLICATION Bilateral 12/22/2021   Procedure: ALLOGRAFT APPLICATION;  Surgeon: Nada Libman, MD;  Location: [facility] OR;  Service: Vascular;  Laterality: Bilateral;   ANGIOPLASTY Left 11/26/2021   Procedure: LEFT INFRARENAL LITHOTRIPSY;  Surgeon: Nada Libman, MD;  Location: [facility] OR;  Service: Vascular;  Laterality: Left;   APPLICATION OF WOUND VAC Bilateral 12/13/2021   Procedure: APPLICATION OF WOUND VAC;  Surgeon: Victorino Sparrow, MD;  Location: [facility] OR;  Service: Vascular;  Laterality: Bilateral;   CARDIAC CATHETERIZATION     [facility]   CATARACT EXTRACTION W/PHACO Left 04/04/2023   Procedure: CATARACT EXTRACTION PHACO AND INTRAOCULAR LENS PLACEMENT (IOC) LEFT 8.71 00:45.5;  Surgeon: Galen Manila, MD;  Location: [facility];  Service: Ophthalmology;  Laterality: Left;   CATARACT EXTRACTION W/PHACO Right 04/18/2023   Procedure: CATARACT EXTRACTION PHACO AND INTRAOCULAR LENS PLACEMENT (IOC) RIGHT 4.31 00:8.2;  Surgeon: Galen Manila, MD;  Location: [facility];  Service: Ophthalmology;  Laterality: Right;  COLONOSCOPY     COLONOSCOPY WITH PROPOFOL N/A 01/17/2018   Procedure: COLONOSCOPY WITH PROPOFOL;  Surgeon: Toledo, Boykin Nearing, MD;  Location: [facility] ENDOSCOPY;  Service: Gastroenterology;  Laterality: N/A;   COLONOSCOPY WITH PROPOFOL N/A 09/12/2022   Procedure: COLONOSCOPY WITH PROPOFOL;  Surgeon: Regis Bill, MD;  Location: [facility] ENDOSCOPY;  Service: Endoscopy;  Laterality: N/A;   ENDARTERECTOMY FEMORAL Bilateral 11/26/2021   Procedure: BILATERAL FEMORAL ENDARTERECTOMY WITH VEIN PATCH  ANGIOPLASTY;  Surgeon: Nada Libman, MD;  Location: [facility] OR;  Service: Vascular;  Laterality: Bilateral;   ESOPHAGOGASTRODUODENOSCOPY (EGD) WITH PROPOFOL N/A 01/17/2018   Procedure: ESOPHAGOGASTRODUODENOSCOPY (EGD) WITH PROPOFOL;  Surgeon: Toledo, Boykin Nearing, MD;  Location: [facility] ENDOSCOPY;  Service: Gastroenterology;  Laterality: N/A;   ESOPHAGOGASTRODUODENOSCOPY (EGD) WITH PROPOFOL N/A 09/12/2022   Procedure: ESOPHAGOGASTRODUODENOSCOPY (EGD) WITH PROPOFOL;  Surgeon: Regis Bill, MD;  Location: [facility] ENDOSCOPY;  Service: Endoscopy;  Laterality: N/A;   GROIN DEBRIDEMENT Bilateral 12/16/2021   Procedure: IRRIGATION AND DEBRIDEMENT OF BILATERAL GROINS;  Surgeon: Nada Libman, MD;  Location: [facility] OR;  Service: Vascular;  Laterality: Bilateral;   INCISION AND DRAINAGE OF WOUND Bilateral 12/13/2021   Procedure: IRRIGATION AND DEBRIDEMENT OF BILATERAL GROIN WOUNDS;  Surgeon: Victorino Sparrow, MD;  Location: [facility] OR;  Service: Vascular;  Laterality: Bilateral;   INSERTION OF ILIAC STENT Bilateral 11/26/2021   Procedure: BILATERAL ILIAC STENTING USING 21mmX80mm INNOVA STENT ON LEFT ILIAC AND 44mmX59mm, 18mmX79mm VBX AND 50mmX7.5cm VIABAHN STENT ON RIGHT ILIAC;  Surgeon: Nada Libman, MD;  Location: [facility] OR;  Service: Vascular;  Laterality: Bilateral;   ORIF ANKLE FRACTURE Right    PROSTATE BIOPSY N/A 04/01/2021   Procedure: PROSTATE BIOPSY Addison Bailey;  Surgeon: Orson Ape, MD;  Location: [facility] ORS;  Service: Urology;  Laterality: N/A;   VEIN HARVEST Bilateral 11/26/2021   Procedure: VEIN HARVEST OF BILATERAL SPAHENOUS VEINS;  Surgeon: Nada Libman, MD;  Location: [facility] OR;  Service: Vascular;  Laterality: Bilateral;   WOUND DEBRIDEMENT Bilateral 12/22/2021   Procedure: INCISION AND DEBRIDEMENT OF BILATERAL GROINS;  Surgeon: Nada Libman, MD;  Location: [facility] OR;  Service: Vascular;  Laterality: Bilateral;    No Known Allergies  Current Outpatient Medications  Medication Sig Dispense Refill    acetaminophen (TYLENOL) 325 MG tablet Take 2 tablets (650 mg total) by mouth every 6 (six) hours as needed for mild pain (or Fever >/= 101). (Patient not taking: Reported on 04/04/2023) 30 tablet 0   albuterol (VENTOLIN HFA) 108 (90 Base) MCG/ACT inhaler Inhale into the lungs every 6 (six) hours as needed for wheezing or shortness of breath.     allopurinol (ZYLOPRIM) 100 MG tablet Take 100 mg by mouth daily.     amiodarone (PACERONE) 200 MG tablet Take 200 mg by mouth daily.     aspirin EC 81 MG tablet Take 81 mg by mouth every morning.     chlorthalidone (HYGROTON) 25 MG tablet Take 25 mg by mouth daily.     Cholecalciferol (VITAMIN D3) 25 MCG (1000 UT) CAPS Take by mouth daily.     clopidogrel (PLAVIX) 75 MG tablet Take 1 tablet (75 mg total) by mouth daily. 30 tablet 11   docusate calcium (SURFAK) 240 MG capsule Take 240 mg by mouth daily.     ezetimibe (ZETIA) 10 MG tablet Take 1 tablet (10 mg total) by mouth daily. 90 tablet 3   fluticasone (FLONASE) 50 MCG/ACT nasal spray Place into both nostrils daily. (Patient not taking: Reported on 07/17/2023)  iron polysaccharides (NIFEREX) 150 MG capsule Take 1 capsule (150 mg total) by mouth daily. 90 capsule 0   lisinopril (ZESTRIL) 10 MG tablet Take 10 mg by mouth daily.     naloxone (NARCAN) nasal spray 4 mg/0.1 mL Place 1 spray into the nose.     pantoprazole (PROTONIX) 20 MG tablet Take 1 tablet (20 mg total) by mouth daily. (Patient not taking: Reported on 07/17/2023) 30 tablet 2   predniSONE (DELTASONE) 10 MG tablet Take 10 mg by mouth as needed.     simvastatin (ZOCOR) 20 MG tablet Take 20 mg by mouth in the morning.     zolpidem (AMBIEN) 5 MG tablet Take 10 mg by mouth at bedtime.     No current facility-administered medications for this visit.    Family History  Problem Relation Age of Onset   Heart disease Mother    Heart disease Brother     Social History   Socioeconomic History   Marital status: Married    Spouse name:  Adelle   Number of children: 2   Years of education: Not on file   Highest education level: Not on file  Occupational History   Occupation: disabled  Tobacco Use   Smoking status: Former    Current packs/day: 0.00    Average packs/day: 1 pack/day for 47.0 years (47.0 ttl pk-yrs)    Types: Cigarettes    Start date: 110    Quit date: 2016    Years since quitting: 8.8    Passive exposure: Never   Smokeless tobacco: Never  Vaping Use   Vaping status: Never Used  Substance and Sexual Activity   Alcohol use: Not Currently   Drug use: No   Sexual activity: Not Currently    Birth control/protection: Other-see comments    Comment: ED  Other Topics Concern   Not on file  Social History Narrative   Not on file   Social Determinants of Health   Financial Resource Strain: Low Risk  (07/17/2023)   Overall Financial Resource Strain (CARDIA)    Difficulty of Paying Living Expenses: Not hard at all  Food Insecurity: No Food Insecurity (07/17/2023)   Hunger Vital Sign    Worried About Running Out of Food in the Last Year: Never true    Ran Out of Food in the Last Year: Never true  Transportation Needs: No Transportation Needs (07/17/2023)   PRAPARE - Administrator, Civil Service (Medical): No    Lack of Transportation (Non-Medical): No  Physical Activity: Not on file  Stress: Not on file  Social Connections: Not on file  Intimate Partner Violence: Not At Risk (07/17/2023)   Humiliation, Afraid, Rape, and Kick questionnaire    Fear of Current or Ex-Partner: No    Emotionally Abused: No    Physically Abused: No    Sexually Abused: No     REVIEW OF SYSTEMS:   [X]  denotes positive finding, [ ]  denotes negative finding Cardiac  Comments:  Chest pain or chest pressure:    Shortness of breath upon exertion:    Short of breath when lying flat:    Irregular heart rhythm:        Vascular    Pain in calf, thigh, or hip brought on by ambulation:    Pain in feet at  night that wakes you up from your sleep:     Blood clot in your veins:    Leg swelling:  Pulmonary    Oxygen at home:    Productive cough:     Wheezing:         Neurologic    Sudden weakness in arms or legs:     Sudden numbness in arms or legs:     Sudden onset of difficulty speaking or slurred speech:    Temporary loss of vision in one eye:     Problems with dizziness:         Gastrointestinal    Blood in stool:     Vomited blood:         Genitourinary    Burning when urinating:     Blood in urine:        Psychiatric    Major depression:         Hematologic    Bleeding problems:    Problems with blood clotting too easily:        Skin    Rashes or ulcers:        Constitutional    Fever or chills:      PHYSICAL EXAMINATION:  Today's Vitals   07/18/23 0923  BP: (!) 121/46  Pulse: (!) 48  Resp: 18  Temp: 97.6 F (36.4 C)  TempSrc: Temporal  SpO2: 97%  Weight: 275 lb 4.8 oz (124.9 kg)  Height: 5\' 10"  (1.778 m)  PainSc: 7   PainLoc: Back   Body mass index is 39.5 kg/m.   General:  WDWN in NAD; vital signs documented above Gait: Not observed HENT: WNL, normocephalic Pulmonary: normal non-labored breathing , without wheezing Cardiac: regular HR, without carotid bruits Abdomen: soft, NT; aortic pulse is not palpable Skin: without rashes Vascular Exam/Pulses:  Right Left  Radial 2+ (normal) 2+ (normal)  Femoral 2+ (normal) Unable to palpate  DP monophasic monophasic  PT monophasic monophasic  Peroneal monophasic monophasic   Extremities: without ischemic changes, without Gangrene , without cellulitis; without open wounds; + superficial varicosities bilateral ankles.  Musculoskeletal: no muscle wasting or atrophy  Neurologic: A&O X 3 Psychiatric:  The pt has Normal affect.   Non-Invasive Vascular Imaging:   ABI's/TBI's on 07/18/2023: Right:  1.14/0.51 - Great toe pressure: 72 Left:  1.08/0.40 - Great toe pressure: 56  Arterial duplex on  07/18/2023: Abdominal Aorta Findings:  +--------+-------+----------+----------+---------+--------+--------+  LocationAP (cm)Trans (cm)PSV (cm/s)Waveform ThrombusComments  +--------+-------+----------+----------+---------+--------+--------+  Proximal                83        triphasic                  +--------+-------+----------+----------+---------+--------+--------+  Mid                     106                                  +--------+-------+----------+----------+---------+--------+--------+  Distal                  133       triphasic                  +--------+-------+----------+----------+---------+--------+--------+   Visualization of the Proximal Abdominal Aorta and Mid Abdominal Aorta was  limited.   Right Stent(s):  +-----------------------+--------+---------------+---------+------------+  Right CIA and EIA stentPSV cm/sStenosis       Waveform Comments      +-----------------------+--------+---------------+---------+------------+  Prox to Stent  133                    triphasicDistal Ao     +-----------------------+--------+---------------+---------+------------+  Proximal Stent         144                    triphasic              +-----------------------+--------+---------------+---------+------------+  Mid Stent              263     50-99% stenosistriphasic              +-----------------------+--------+---------------+---------+------------+  Distal Stent           305     50-99% stenosistriphasic              +-----------------------+--------+---------------+---------+------------+  Distal to Stent        275     50-99% stenosistriphasicProximal CFA  +-----------------------+--------+---------------+---------+------------+      Left Stent(s):  +-----------------------+--------+---------------+---------+---------+  Right CIA and EIA stentPSV cm/sStenosis       Waveform Comments    +-----------------------+--------+---------------+---------+---------+  Prox to Stent          133                    triphasicDistal Ao  +-----------------------+--------+---------------+---------+---------+  Proximal Stent         176     1-49% stenosis triphasic           +-----------------------+--------+---------------+---------+---------+  Mid Stent              197     1-49% stenosis triphasic           +-----------------------+--------+---------------+---------+---------+  Distal Stent           275     50-99% stenosistriphasic           +-----------------------+--------+---------------+---------+---------+  Distal to Stent        96      1-49% stenosis triphasic           +-----------------------+--------+---------------+---------+---------+   Summary:  Stenosis: +--------------------+---------------+  Location            Stent            +--------------------+---------------+  Right Common Iliac  50-99% stenosis  +--------------------+---------------+  Left Common Iliac   1-49% stenosis   +--------------------+---------------+  Right External Iliac50-99% stenosis  +--------------------+---------------+  Left External Iliac 50-99% stenosis  +--------------------+---------------+    Previous ABI's/TBI's on 01/16/2023: Right:  1.20/0.59 - Great toe pressure: 88 Left:  Benld/0.79 - Great toe pressure:  117  Previous arterial duplex on 01/16/2023: Right Stent(s):  +-----------------------+--------+---------------+---------+--------+  Right CIA and EIA stentPSV cm/sStenosis       Waveform Comments  +-----------------------+--------+---------------+---------+--------+  Prox to Stent          178                    triphasic          +-----------------------+--------+---------------+---------+--------+  Proximal Stent         149                    triphasic           +-----------------------+--------+---------------+---------+--------+  Mid Stent              169                    triphasic          +-----------------------+--------+---------------+---------+--------+  Distal Stent           229     50-99% stenosistriphasic          +-----------------------+--------+---------------+---------+--------+  Distal to Stent        190                    triphasic          +-----------------------+--------+---------------+---------+--------+         Left Stent(s):  +----------------------+--------+---------------+---------+--------+  Left CIA and EIA stentPSV cm/sStenosis       Waveform Comments  +----------------------+--------+---------------+---------+--------+  Prox to Stent         178                    triphasic          +----------------------+--------+---------------+---------+--------+  Proximal Stent        258     50-99% stenosistriphasic          +----------------------+--------+---------------+---------+--------+  Mid Stent             233     50-99% stenosistriphasic          +----------------------+--------+---------------+---------+--------+  Distal Stent          157                    biphasic           +----------------------+--------+---------------+---------+--------+  Distal to Stent       114                    biphasic           +----------------------+--------+---------------+---------+--------+   Left CFA is dilated with proximal 0.87 x 0.80 cm diameter increasing to  2.3 x 2.6cm diameter at the mid CFA level.     Stenosis: +--------------------+---------------+  Location            Stent            +--------------------+---------------+  Right Common Iliac  no stenosis      +--------------------+---------------+  Left Common Iliac   50-99% stenosis  +--------------------+---------------+  Right External Iliac50-99% stenosis   +--------------------+---------------+  Left External Iliac 50-99% stenosis  +--------------------+---------------+   Left CFA is dilated with proximal 0.87 x 0.80 cm diameter increasing to  2.3 x 2.6cm diameter at the mid CFA level.     ASSESSMENT/PLAN:: 66 y.o. male here for follow up for PAD with extensive revascularization hx of: #1: Right iliofemoral endarterectomy with vein patch angioplasty #2: Left iliofemoral endarterectomy with vein patch angioplasty #3: Stent, left common iliac artery #4: Stent, left external iliac artery #5: Intra-arterial lithotripsy: Left common and external iliac arteries #6: Intra-arterial lithotripsy: Right common and external iliac arteries #7: Stent, right external iliac artery #8: Stent, right common femoral artery   -pt with monophasic doppler signal bilateral DP/PT/peroneal.  He is not having any claudication, rest pain or non healing wounds, however, I am unable to palpate is left femoral pulse and he has had a significant drop in his TBI and toe pressure on the left and increased velocities from prior visit.  He also has increased velocity in the distal right iliac stent.   Given the above, I discussed with pt about proceeding with arteriogram to evaluate. He does have a palpable right femoral pulse.  He is in agreement to proceed.   -continue plavix/statin -will schedule at time that is convenient with Dr. Myra Gianotti and pt.  Doreatha Massed, [facility] [phone]  Clinic MD:   Karin Lieu

## 2023-07-24 ENCOUNTER — Inpatient Hospital Stay: Payer: Medicare PPO

## 2023-07-24 VITALS — BP 129/46 | HR 45 | Temp 97.2°F | Resp 16

## 2023-07-24 DIAGNOSIS — D509 Iron deficiency anemia, unspecified: Secondary | ICD-10-CM | POA: Diagnosis not present

## 2023-07-24 MED ORDER — IRON SUCROSE 20 MG/ML IV SOLN
200.0000 mg | Freq: Once | INTRAVENOUS | Status: AC
Start: 2023-07-24 — End: 2023-07-24
  Administered 2023-07-24: 200 mg via INTRAVENOUS
  Filled 2023-07-24: qty 10

## 2023-07-24 NOTE — Patient Instructions (Signed)
Iron Sucrose Injection What is this medication? IRON SUCROSE (EYE ern SOO krose) treats low levels of iron (iron deficiency anemia) in people with kidney disease. Iron is a mineral that plays an important role in making red blood cells, which carry oxygen from your lungs to the rest of your body. This medicine may be used for other purposes; ask your health care provider or pharmacist if you have questions. COMMON BRAND NAME(S): Venofer What should I tell my care team before I take this medication? They need to know if you have any of these conditions: Anemia not caused by low iron levels Heart disease High levels of iron in the blood Kidney disease Liver disease An unusual or allergic reaction to iron, other medications, foods, dyes, or preservatives Pregnant or trying to get pregnant Breastfeeding How should I use this medication? This medication is for infusion into a vein. It is given in a hospital or clinic setting. Talk to your care team about the use of this medication in children. While this medication may be prescribed for children as young as 2 years for selected conditions, precautions do apply. Overdosage: If you think you have taken too much of this medicine contact a poison control center or emergency room at once. NOTE: This medicine is only for you. Do not share this medicine with others. What if I miss a dose? Keep appointments for follow-up doses. It is important not to miss your dose. Call your care team if you are unable to keep an appointment. What may interact with this medication? Do not take this medication with any of the following: Deferoxamine Dimercaprol Other iron products This medication may also interact with the following: Chloramphenicol Deferasirox This list may not describe all possible interactions. Give your health care provider a list of all the medicines, herbs, non-prescription drugs, or dietary supplements you use. Also tell them if you smoke,  drink alcohol, or use illegal drugs. Some items may interact with your medicine. What should I watch for while using this medication? Visit your care team regularly. Tell your care team if your symptoms do not start to get better or if they get worse. You may need blood work done while you are taking this medication. You may need to follow a special diet. Talk to your care team. Foods that contain iron include: whole grains/cereals, dried fruits, beans, or peas, leafy green vegetables, and organ meats (liver, kidney). What side effects may I notice from receiving this medication? Side effects that you should report to your care team as soon as possible: Allergic reactions--skin rash, itching, hives, swelling of the face, lips, tongue, or throat Low blood pressure--dizziness, feeling faint or lightheaded, blurry vision Shortness of breath Side effects that usually do not require medical attention (report to your care team if they continue or are bothersome): Flushing Headache Joint pain Muscle pain Nausea Pain, redness, or irritation at injection site This list may not describe all possible side effects. Call your doctor for medical advice about side effects. You may report side effects to FDA at 1-800-FDA-1088. Where should I keep my medication? This medication is given in a hospital or clinic. It will not be stored at home. NOTE: This sheet is a summary. It may not cover all possible information. If you have questions about this medicine, talk to your doctor, pharmacist, or health care provider.  2024 Elsevier/Gold Standard (2023-01-20 00:00:00)

## 2023-07-25 ENCOUNTER — Other Ambulatory Visit: Payer: Self-pay

## 2023-07-25 DIAGNOSIS — I771 Stricture of artery: Secondary | ICD-10-CM

## 2023-07-31 ENCOUNTER — Inpatient Hospital Stay: Payer: Medicare PPO

## 2023-08-02 ENCOUNTER — Inpatient Hospital Stay: Payer: Medicare PPO | Attending: Oncology

## 2023-08-02 VITALS — BP 126/50 | HR 43 | Temp 97.7°F | Resp 17

## 2023-08-02 DIAGNOSIS — D509 Iron deficiency anemia, unspecified: Secondary | ICD-10-CM | POA: Diagnosis present

## 2023-08-02 MED ORDER — IRON SUCROSE 20 MG/ML IV SOLN
200.0000 mg | Freq: Once | INTRAVENOUS | Status: AC
  Administered 2023-08-02: 200 mg via INTRAVENOUS
  Filled 2023-08-02: qty 10

## 2023-08-02 MED ORDER — SODIUM CHLORIDE 0.9% FLUSH
10.0000 mL | Freq: Once | INTRAVENOUS | Status: AC | PRN
  Administered 2023-08-02: 10 mL
  Filled 2023-08-02: qty 10

## 2023-08-02 NOTE — Patient Instructions (Signed)
Iron Sucrose Injection What is this medication? IRON SUCROSE (EYE ern SOO krose) treats low levels of iron (iron deficiency anemia) in people with kidney disease. Iron is a mineral that plays an important role in making red blood cells, which carry oxygen from your lungs to the rest of your body. This medicine may be used for other purposes; ask your health care provider or pharmacist if you have questions. COMMON BRAND NAME(S): Venofer What should I tell my care team before I take this medication? They need to know if you have any of these conditions: Anemia not caused by low iron levels Heart disease High levels of iron in the blood Kidney disease Liver disease An unusual or allergic reaction to iron, other medications, foods, dyes, or preservatives Pregnant or trying to get pregnant Breastfeeding How should I use this medication? This medication is for infusion into a vein. It is given in a hospital or clinic setting. Talk to your care team about the use of this medication in children. While this medication may be prescribed for children as young as 2 years for selected conditions, precautions do apply. Overdosage: If you think you have taken too much of this medicine contact a poison control center or emergency room at once. NOTE: This medicine is only for you. Do not share this medicine with others. What if I miss a dose? Keep appointments for follow-up doses. It is important not to miss your dose. Call your care team if you are unable to keep an appointment. What may interact with this medication? Do not take this medication with any of the following: Deferoxamine Dimercaprol Other iron products This medication may also interact with the following: Chloramphenicol Deferasirox This list may not describe all possible interactions. Give your health care provider a list of all the medicines, herbs, non-prescription drugs, or dietary supplements you use. Also tell them if you smoke,  drink alcohol, or use illegal drugs. Some items may interact with your medicine. What should I watch for while using this medication? Visit your care team regularly. Tell your care team if your symptoms do not start to get better or if they get worse. You may need blood work done while you are taking this medication. You may need to follow a special diet. Talk to your care team. Foods that contain iron include: whole grains/cereals, dried fruits, beans, or peas, leafy green vegetables, and organ meats (liver, kidney). What side effects may I notice from receiving this medication? Side effects that you should report to your care team as soon as possible: Allergic reactions--skin rash, itching, hives, swelling of the face, lips, tongue, or throat Low blood pressure--dizziness, feeling faint or lightheaded, blurry vision Shortness of breath Side effects that usually do not require medical attention (report to your care team if they continue or are bothersome): Flushing Headache Joint pain Muscle pain Nausea Pain, redness, or irritation at injection site This list may not describe all possible side effects. Call your doctor for medical advice about side effects. You may report side effects to FDA at 1-800-FDA-1088. Where should I keep my medication? This medication is given in a hospital or clinic. It will not be stored at home. NOTE: This sheet is a summary. It may not cover all possible information. If you have questions about this medicine, talk to your doctor, pharmacist, or health care provider.  2024 Elsevier/Gold Standard (2023-01-20 00:00:00)

## 2023-08-04 ENCOUNTER — Emergency Department
Admission: EM | Admit: 2023-08-04 | Discharge: 2023-08-04 | Disposition: A | Discharge status: Home / Self Care | Payer: Medicare PPO | Attending: Emergency Medicine | Admitting: Emergency Medicine

## 2023-08-04 ENCOUNTER — Inpatient Hospital Stay: Payer: Medicare PPO

## 2023-08-04 ENCOUNTER — Encounter: Payer: Self-pay | Admitting: *Deleted

## 2023-08-04 ENCOUNTER — Other Ambulatory Visit: Payer: Self-pay

## 2023-08-04 DIAGNOSIS — N189 Chronic kidney disease, unspecified: Secondary | ICD-10-CM | POA: Diagnosis not present

## 2023-08-04 DIAGNOSIS — D649 Anemia, unspecified: Secondary | ICD-10-CM | POA: Diagnosis not present

## 2023-08-04 DIAGNOSIS — R42 Dizziness and giddiness: Secondary | ICD-10-CM | POA: Diagnosis present

## 2023-08-04 DIAGNOSIS — J449 Chronic obstructive pulmonary disease, unspecified: Secondary | ICD-10-CM | POA: Diagnosis not present

## 2023-08-04 LAB — BASIC METABOLIC PANEL
Anion gap: 7 (ref 5–15)
BUN: 48 mg/dL — ABNORMAL HIGH (ref 8–23)
CO2: 19 mmol/L — ABNORMAL LOW (ref 22–32)
Calcium: 8.5 mg/dL — ABNORMAL LOW (ref 8.9–10.3)
Chloride: 116 mmol/L — ABNORMAL HIGH (ref 98–111)
Creatinine, Ser: 2.49 mg/dL — ABNORMAL HIGH (ref 0.61–1.24)
GFR, Estimated: 28 mL/min — ABNORMAL LOW (ref >60)
Glucose, Bld: 81 mg/dL (ref 70–99)
Potassium: 4.4 mmol/L (ref 3.5–5.1)
Sodium: 142 mmol/L (ref 135–145)

## 2023-08-04 LAB — URINALYSIS, ROUTINE W REFLEX MICROSCOPIC
Bilirubin Urine: NEGATIVE
Glucose, UA: NEGATIVE mg/dL
Hgb urine dipstick: NEGATIVE
Ketones, ur: NEGATIVE mg/dL
Leukocytes,Ua: NEGATIVE
Nitrite: NEGATIVE
Protein, ur: NEGATIVE mg/dL
Specific Gravity, Urine: 1.013 (ref 1.005–1.030)
pH: 5 (ref 5.0–8.0)

## 2023-08-04 LAB — CBC
HCT: 22.6 % — ABNORMAL LOW (ref 39.0–52.0)
Hemoglobin: 6.1 g/dL — ABNORMAL LOW (ref 13.0–17.0)
MCH: 26.2 pg (ref 26.0–34.0)
MCHC: 27 g/dL — ABNORMAL LOW (ref 30.0–36.0)
MCV: 97 fL (ref 80.0–100.0)
Platelets: 229 10*3/uL (ref 150–400)
RBC: 2.33 MIL/uL — ABNORMAL LOW (ref 4.22–5.81)
RDW: 22.2 % — ABNORMAL HIGH (ref 11.5–15.5)
WBC: 4.3 10*3/uL (ref 4.0–10.5)
nRBC: 0.7 % — ABNORMAL HIGH (ref 0.0–0.2)

## 2023-08-04 LAB — PREPARE RBC (CROSSMATCH)

## 2023-08-04 MED ORDER — SODIUM CHLORIDE 0.9% FLUSH
10.0000 mL | Freq: Two times a day (BID) | INTRAVENOUS | Status: DC
  Administered 2023-08-04: 10 mL via INTRAVENOUS

## 2023-08-04 NOTE — ED Provider Notes (Signed)
[facility] Provider Note    Event Date/Time   First MD Initiated Contact with Patient 08/04/23 1252     (approximate)   History   Chief Complaint: low hgb   HPI  Kevin Gross is a 67 y.o. male with a history of COPD, CKD, chronic anemia who comes to the ED due to feeling cold, dizziness with standing.  Outpatient labs revealed a hemoglobin of 5.8.  Patient reports that he has been having dark stools but attributes this to iron supplement use.  He gets iron infusions from the hematology clinic.  Denies any vomiting fever or trauma.          Physical Exam   Triage Vital Signs: ED Triage Vitals  Encounter Vitals Group     BP 08/04/23 1246 (!) 125/40     Systolic BP Percentile --      Diastolic BP Percentile --      Pulse Rate 08/04/23 1246 (!) 52     Resp 08/04/23 1246 18     Temp 08/04/23 1246 98 F (36.7 C)     Temp Source 08/04/23 1434 Oral     SpO2 08/04/23 1246 100 %     Weight 08/04/23 1246 275 lb 4.8 oz (124.9 kg)     Height 08/04/23 1246 5\' 10"  (1.778 m)     Head Circumference --      Peak Flow --      Pain Score 08/04/23 1246 3     Pain Loc --      Pain Education --      Exclude from Growth Chart --     Most recent vital signs: Vitals:   08/04/23 1439 08/04/23 1453  BP: (!) 130/57 (!) 138/54  Pulse: (!) 51 (!) 50  Resp: 13 14  Temp: 98.1 F (36.7 C) 98.2 F (36.8 C)  SpO2:  100%    General: Awake, no distress.  CV:  Good peripheral perfusion.  Regular rate and rhythm Resp:  Normal effort.  Clear to auscultation bilaterally Abd:  No distention.  Soft nontender.  Rectal exam reveals dark stool, Hemoccult negative Other:  Moist oral mucosa.   ED Results / Procedures / Treatments   Labs (all labs ordered are listed, but only abnormal results are displayed) Labs Reviewed  BASIC METABOLIC PANEL - Abnormal; Notable for the following components:      Result Value   Chloride 116 (*)    CO2 19 (*)    BUN 48 (*)     Creatinine, Ser 2.49 (*)    Calcium 8.5 (*)    GFR, Estimated 28 (*)    All other components within normal limits  CBC - Abnormal; Notable for the following components:   RBC 2.33 (*)    Hemoglobin 6.1 (*)    HCT 22.6 (*)    MCHC 27.0 (*)    RDW 22.2 (*)    nRBC 0.7 (*)    All other components within normal limits  URINALYSIS, ROUTINE W REFLEX MICROSCOPIC  TYPE AND SCREEN  PREPARE RBC (CROSSMATCH)     EKG    RADIOLOGY    PROCEDURES:  .Critical Care  Performed by: Sharman Cheek, MD Authorized by: Sharman Cheek, MD   Critical care provider statement:    Critical care time (minutes):  35   Critical care time was exclusive of:  Separately billable procedures and treating other patients   Critical care was necessary to treat or prevent imminent or life-threatening deterioration  of the following conditions:  Circulatory failure   Critical care was time spent personally by me on the following activities:  Development of treatment plan with patient or surrogate, discussions with consultants, evaluation of patient's response to treatment, examination of patient, obtaining history from patient or surrogate, ordering and performing treatments and interventions, ordering and review of laboratory studies, ordering and review of radiographic studies, pulse oximetry, re-evaluation of patient's condition and review of old charts    MEDICATIONS ORDERED IN ED: Medications  sodium chloride flush (NS) 0.9 % injection 10 mL (10 mLs Intravenous Given 08/04/23 1425)     IMPRESSION / MDM / ASSESSMENT AND PLAN / ED COURSE  I reviewed the triage vital signs and the nursing notes.  DDx: Anemia, AKI on CKD, electrolyte abnormality, fatigue  Patient's presentation is most consistent with acute presentation with potential threat to life or bodily function.  Patient presents with dizziness with standing and feeling cold.  Found to have acute on chronic anemia, hemoglobin of 6.  No other  acute complaints, CKD is at baseline.  Has established care with hematology already.  Doubt iron deficiency based on his high normal MCV and recent iron infusions from cancer center.  Will transfuse 1 unit RBCs for supportive care.  If feeling better, anticipate he will be stable for discharge at that point.       FINAL CLINICAL IMPRESSION(S) / ED DIAGNOSES   Final diagnoses:  Symptomatic anemia     Rx / DC Orders   ED Discharge Orders     None        Note:  This document was prepared using Dragon voice recognition software and may include unintentional dictation errors.   Sharman Cheek, MD 08/04/23 1622

## 2023-08-04 NOTE — ED Notes (Signed)
Set Blood rate at 150 mL/hr d/t Hx of CHF and Kidney

## 2023-08-04 NOTE — Discharge Instructions (Addendum)
Please seek medical attention for any high fevers, chest pain, shortness of breath, change in behavior, persistent vomiting, bloody stool or any other new or concerning symptoms.  

## 2023-08-04 NOTE — ED Notes (Signed)
Pt taken straight to Ed 17 by Massie Bougie

## 2023-08-04 NOTE — ED Triage Notes (Addendum)
Pt comes with c/o abnormal Hgb. Pt states he went to his doc about his kidney issues. Pt states he had bloodwork done. Pt called and informed of Hgb of 5.8. pt appears pale. Pt denies any dark stools. Pt states hx of this and did receive transfusions.   Pt is on plavix. Pt appears very pale.  Pt states his results were from couple weeks ago. Pt states it could be lower. Pt states increased fatigue. Pt states he had been getting iron transfusions.

## 2023-08-04 NOTE — ED Notes (Signed)
Pt states he is on plavix.

## 2023-08-05 LAB — TYPE AND SCREEN
ABO/RH(D): A POS
Antibody Screen: NEGATIVE
Unit division: 0

## 2023-08-05 LAB — BPAM RBC
Blood Product Expiration Date: 202412312359
ISSUE DATE / TIME: 202412061431
Unit Type and Rh: 6200

## 2023-08-07 ENCOUNTER — Inpatient Hospital Stay: Payer: Medicare PPO

## 2023-08-07 VITALS — BP 131/57 | HR 48 | Temp 96.9°F | Resp 18

## 2023-08-07 DIAGNOSIS — D509 Iron deficiency anemia, unspecified: Secondary | ICD-10-CM

## 2023-08-07 MED ORDER — IRON SUCROSE 20 MG/ML IV SOLN
200.0000 mg | Freq: Once | INTRAVENOUS | Status: AC
  Administered 2023-08-07: 200 mg via INTRAVENOUS

## 2023-08-07 NOTE — Patient Instructions (Signed)
Iron Sucrose Injection What is this medication? IRON SUCROSE (EYE ern SOO krose) treats low levels of iron (iron deficiency anemia) in people with kidney disease. Iron is a mineral that plays an important role in making red blood cells, which carry oxygen from your lungs to the rest of your body. This medicine may be used for other purposes; ask your health care provider or pharmacist if you have questions. COMMON BRAND NAME(S): Venofer What should I tell my care team before I take this medication? They need to know if you have any of these conditions: Anemia not caused by low iron levels Heart disease High levels of iron in the blood Kidney disease Liver disease An unusual or allergic reaction to iron, other medications, foods, dyes, or preservatives Pregnant or trying to get pregnant Breastfeeding How should I use this medication? This medication is for infusion into a vein. It is given in a hospital or clinic setting. Talk to your care team about the use of this medication in children. While this medication may be prescribed for children as young as 2 years for selected conditions, precautions do apply. Overdosage: If you think you have taken too much of this medicine contact a poison control center or emergency room at once. NOTE: This medicine is only for you. Do not share this medicine with others. What if I miss a dose? Keep appointments for follow-up doses. It is important not to miss your dose. Call your care team if you are unable to keep an appointment. What may interact with this medication? Do not take this medication with any of the following: Deferoxamine Dimercaprol Other iron products This medication may also interact with the following: Chloramphenicol Deferasirox This list may not describe all possible interactions. Give your health care provider a list of all the medicines, herbs, non-prescription drugs, or dietary supplements you use. Also tell them if you smoke,  drink alcohol, or use illegal drugs. Some items may interact with your medicine. What should I watch for while using this medication? Visit your care team regularly. Tell your care team if your symptoms do not start to get better or if they get worse. You may need blood work done while you are taking this medication. You may need to follow a special diet. Talk to your care team. Foods that contain iron include: whole grains/cereals, dried fruits, beans, or peas, leafy green vegetables, and organ meats (liver, kidney). What side effects may I notice from receiving this medication? Side effects that you should report to your care team as soon as possible: Allergic reactions--skin rash, itching, hives, swelling of the face, lips, tongue, or throat Low blood pressure--dizziness, feeling faint or lightheaded, blurry vision Shortness of breath Side effects that usually do not require medical attention (report to your care team if they continue or are bothersome): Flushing Headache Joint pain Muscle pain Nausea Pain, redness, or irritation at injection site This list may not describe all possible side effects. Call your doctor for medical advice about side effects. You may report side effects to FDA at 1-800-FDA-1088. Where should I keep my medication? This medication is given in a hospital or clinic. It will not be stored at home. NOTE: This sheet is a summary. It may not cover all possible information. If you have questions about this medicine, talk to your doctor, pharmacist, or health care provider.  2024 Elsevier/Gold Standard (2023-01-20 00:00:00)

## 2023-08-08 ENCOUNTER — Ambulatory Visit (HOSPITAL_COMMUNITY)
Admission: RE | Admit: 2023-08-08 | Discharge: 2023-08-08 | Disposition: A | Discharge status: Home / Self Care | Payer: Medicare PPO | Attending: Surgery | Admitting: Surgery

## 2023-08-08 ENCOUNTER — Encounter (HOSPITAL_COMMUNITY)
Admission: RE | Disposition: A | Discharge status: Home / Self Care | Payer: Self-pay | Source: Home / Self Care | Attending: Surgery

## 2023-08-08 ENCOUNTER — Encounter (HOSPITAL_COMMUNITY): Payer: Self-pay | Admitting: Surgery

## 2023-08-08 ENCOUNTER — Inpatient Hospital Stay: Payer: Medicare PPO

## 2023-08-08 ENCOUNTER — Other Ambulatory Visit: Payer: Self-pay

## 2023-08-08 DIAGNOSIS — Z7982 Long term (current) use of aspirin: Secondary | ICD-10-CM | POA: Insufficient documentation

## 2023-08-08 DIAGNOSIS — Z87891 Personal history of nicotine dependence: Secondary | ICD-10-CM | POA: Insufficient documentation

## 2023-08-08 DIAGNOSIS — Z7902 Long term (current) use of antithrombotics/antiplatelets: Secondary | ICD-10-CM | POA: Insufficient documentation

## 2023-08-08 DIAGNOSIS — Z8249 Family history of ischemic heart disease and other diseases of the circulatory system: Secondary | ICD-10-CM | POA: Diagnosis not present

## 2023-08-08 DIAGNOSIS — I771 Stricture of artery: Secondary | ICD-10-CM

## 2023-08-08 DIAGNOSIS — I70212 Atherosclerosis of native arteries of extremities with intermittent claudication, left leg: Secondary | ICD-10-CM | POA: Insufficient documentation

## 2023-08-08 DIAGNOSIS — I130 Hypertensive heart and chronic kidney disease with heart failure and stage 1 through stage 4 chronic kidney disease, or unspecified chronic kidney disease: Secondary | ICD-10-CM | POA: Insufficient documentation

## 2023-08-08 HISTORY — PX: PERIPHERAL INTRAVASCULAR LITHOTRIPSY: CATH118324

## 2023-08-08 HISTORY — PX: ABDOMINAL AORTOGRAM W/LOWER EXTREMITY: CATH118223

## 2023-08-08 LAB — POCT I-STAT, CHEM 8
BUN: 39 mg/dL — ABNORMAL HIGH (ref 8–23)
Calcium, Ion: 1.21 mmol/L (ref 1.15–1.40)
Chloride: 115 mmol/L — ABNORMAL HIGH (ref 98–111)
Creatinine, Ser: 2.8 mg/dL — ABNORMAL HIGH (ref 0.61–1.24)
Glucose, Bld: 89 mg/dL (ref 70–99)
HCT: 24 % — ABNORMAL LOW (ref 39.0–52.0)
Hemoglobin: 8.2 g/dL — ABNORMAL LOW (ref 13.0–17.0)
Potassium: 4.7 mmol/L (ref 3.5–5.1)
Sodium: 145 mmol/L (ref 135–145)
TCO2: 21 mmol/L — ABNORMAL LOW (ref 22–32)

## 2023-08-08 LAB — GLUCOSE, CAPILLARY: Glucose-Capillary: 85 mg/dL (ref 70–99)

## 2023-08-08 LAB — POCT ACTIVATED CLOTTING TIME: Activated Clotting Time: 199 s

## 2023-08-08 SURGERY — ABDOMINAL AORTOGRAM W/LOWER EXTREMITY
Anesthesia: LOCAL

## 2023-08-08 MED ORDER — MORPHINE SULFATE (PF) 2 MG/ML IV SOLN: 2.0000 mg | INTRAVENOUS | Status: DC | PRN

## 2023-08-08 MED ORDER — FENTANYL CITRATE (PF) 100 MCG/2ML IJ SOLN
INTRAMUSCULAR | Status: DC | PRN
  Administered 2023-08-08: 50 ug via INTRAVENOUS

## 2023-08-08 MED ORDER — IODIXANOL 320 MG/ML IV SOLN
INTRAVENOUS | Status: DC | PRN
  Administered 2023-08-08: 17 mL

## 2023-08-08 MED ORDER — LIDOCAINE HCL (PF) 1 % IJ SOLN
INTRAMUSCULAR | Status: DC | PRN
  Administered 2023-08-08: 10 mL via INTRADERMAL

## 2023-08-08 MED ORDER — SODIUM CHLORIDE 0.9 % IV SOLN: INTRAVENOUS | Status: DC

## 2023-08-08 MED ORDER — HEPARIN (PORCINE) IN NACL 1000-0.9 UT/500ML-% IV SOLN
INTRAVENOUS | Status: DC | PRN
  Administered 2023-08-08: 1000 mL

## 2023-08-08 MED ORDER — LIDOCAINE HCL (PF) 1 % IJ SOLN
INTRAMUSCULAR | Status: AC
  Filled 2023-08-08: qty 30

## 2023-08-08 MED ORDER — OXYCODONE HCL 5 MG PO TABS: 5.0000 mg | ORAL_TABLET | ORAL | Status: DC | PRN

## 2023-08-08 MED ORDER — MIDAZOLAM HCL 2 MG/2ML IJ SOLN
INTRAMUSCULAR | Status: DC | PRN
  Administered 2023-08-08: 2 mg via INTRAVENOUS

## 2023-08-08 MED ORDER — HYDRALAZINE HCL 20 MG/ML IJ SOLN: 5.0000 mg | INTRAMUSCULAR | Status: DC | PRN

## 2023-08-08 MED ORDER — SODIUM CHLORIDE 0.9% FLUSH: 3.0000 mL | INTRAVENOUS | Status: DC | PRN

## 2023-08-08 MED ORDER — ACETAMINOPHEN 325 MG PO TABS: 650.0000 mg | ORAL_TABLET | ORAL | Status: DC | PRN

## 2023-08-08 MED ORDER — HEPARIN SODIUM (PORCINE) 1000 UNIT/ML IJ SOLN
INTRAMUSCULAR | Status: AC
  Filled 2023-08-08: qty 10

## 2023-08-08 MED ORDER — ONDANSETRON HCL 4 MG/2ML IJ SOLN
4.0000 mg | Freq: Four times a day (QID) | INTRAMUSCULAR | Status: DC | PRN
Start: 2023-08-08 — End: 2023-08-08

## 2023-08-08 MED ORDER — SODIUM CHLORIDE 0.9 % WEIGHT BASED INFUSION: 1.0000 mL/kg/h | INTRAVENOUS | Status: DC

## 2023-08-08 MED ORDER — MIDAZOLAM HCL 2 MG/2ML IJ SOLN
INTRAMUSCULAR | Status: AC
  Filled 2023-08-08: qty 2

## 2023-08-08 MED ORDER — FENTANYL CITRATE (PF) 100 MCG/2ML IJ SOLN
INTRAMUSCULAR | Status: AC
  Filled 2023-08-08: qty 2

## 2023-08-08 MED ORDER — SODIUM CHLORIDE 0.9% FLUSH: 3.0000 mL | Freq: Two times a day (BID) | INTRAVENOUS | Status: DC

## 2023-08-08 MED ORDER — LABETALOL HCL 5 MG/ML IV SOLN: 10.0000 mg | INTRAVENOUS | Status: DC | PRN

## 2023-08-08 MED ORDER — HEPARIN SODIUM (PORCINE) 1000 UNIT/ML IJ SOLN
INTRAMUSCULAR | Status: DC | PRN
  Administered 2023-08-08: 10000 [IU] via INTRAVENOUS
  Administered 2023-08-08: 2000 [IU] via INTRAVENOUS

## 2023-08-08 MED ORDER — SODIUM CHLORIDE 0.9 % IV SOLN: 250.0000 mL | INTRAVENOUS | Status: DC | PRN

## 2023-08-08 SURGICAL SUPPLY — 25 items
BALLN MUSTANG 6X80X135 (BALLOONS) ×2
BALLOON MUSTANG 6X80X135 (BALLOONS) IMPLANT
CATH NAVICROSS ANG 65CM (CATHETERS) IMPLANT
CATH OMNI FLUSH 5F 65CM (CATHETERS) IMPLANT
CATH SHOCKWAVE E8 5X80 (CATHETERS) IMPLANT
CATHETER NAVICROSS ANG 65CM (CATHETERS) ×2
CLOSURE MYNX CONTROL 6F/7F (Vascular Products) IMPLANT
COVER DOME SNAP 22 D (MISCELLANEOUS) IMPLANT
GLIDEWIRE ADV .035X260CM (WIRE) IMPLANT
GUIDEWIRE NITREX 0.018X80X5 (WIRE) IMPLANT
KIT ANGIASSIST CO2 SYSTEM (KITS) IMPLANT
KIT ENCORE 26 ADVANTAGE (KITS) IMPLANT
KIT MICROPUNCTURE NIT STIFF (SHEATH) IMPLANT
KIT PV (KITS) ×2 IMPLANT
PROTECTION STATION PRESSURIZED (MISCELLANEOUS) ×2
SET ATX-X65L (MISCELLANEOUS) IMPLANT
SHEATH CATAPULT 6FR 45 (SHEATH) IMPLANT
SHEATH PINNACLE 5F 10CM (SHEATH) IMPLANT
SHEATH PINNACLE 6F 10CM (SHEATH) IMPLANT
SHEATH PROBE COVER 6X72 (BAG) IMPLANT
STATION PROTECTION PRESSURIZED (MISCELLANEOUS) IMPLANT
STENT ELUVIA 7X80X130 (Permanent Stent) IMPLANT
TRAY PV CATH (CUSTOM PROCEDURE TRAY) ×2 IMPLANT
WIRE SPARTACORE .014X300CM (WIRE) IMPLANT
WIRE STARTER BENTSON 035X150 (WIRE) IMPLANT

## 2023-08-08 NOTE — Interval H&P Note (Signed)
History and Physical Interval Note:  08/08/2023 10:16 AM  Kevin Gross  has presented today for surgery, with the diagnosis of iilac artery stenosis.  The various methods of treatment have been discussed with the patient and family. After consideration of risks, benefits and other options for treatment, the patient has consented to  Procedure(s): ABDOMINAL AORTOGRAM W/LOWER EXTREMITY (N/A) as a surgical intervention.  The patient's history has been reviewed, patient examined, no change in status, stable for surgery.  I have reviewed the patient's chart and labs.  Questions were answered to the patient's satisfaction.     Durene Cal

## 2023-08-08 NOTE — Progress Notes (Signed)
Patient and son in law was given discharge instructions. Both verbalized understanding.

## 2023-08-08 NOTE — Op Note (Signed)
Patient name: KYRI CARPIO MRN: [id number] DOB: 15-Sep-1955 Sex: male  08/08/2023 Pre-operative Diagnosis: Left leg claudication Post-operative diagnosis:  Same Surgeon:  Durene Cal Procedure Performed:  1.  Ultrasound-guided access, right femoral artery  2.  Abdominal aortogram with CO2  3.  Bilateral lower extremity angiogram  4.  Intra-arterial lithotripsy (shockwave) left superficial femoral artery  5.  Stent, left superficial femoral artery  6.  Conscious sedation, 65 minutes  7.  Closure device, Mynx   Indications: This is a 67 year old gentleman who has undergone bilateral femoral endarterectomy with iliac stenting.  There was concern and his outpatient visit that there were elevated velocities in the left side with the inability to feel a femoral pulse.  He comes in today for angiogram.  He tells me that 2 days ago he began having short distance claudication in the left leg  Procedure:  The patient was identified in the holding area and taken to room 8.  The patient was then placed supine on the table and prepped and draped in the usual sterile fashion.  A time out was called.  Conscious sedation was administered with the use of IV fentanyl and Versed under continuous physician and nurse monitoring.  Heart rate, blood pressure, and oxygen saturation were continuously monitored.  Total sedation time was 65 minutes.  Ultrasound was used to evaluate the right common femoral artery.  It was patent .  A digital ultrasound image was acquired.  A micropuncture needle was used to access the right common femoral artery under ultrasound guidance.  An 018 wire was advanced without resistance and a micropuncture sheath was placed.  The 018 wire was removed and a benson wire was placed.  The micropuncture sheath was exchanged for a 5 french sheath.  An omniflush catheter was advanced over the wire to the level of L-1.  An abdominal angiogram was obtained.  Next, using the omniflush catheter and  a benson wire, the aortic bifurcation was crossed and the catheter was placed into theleft external iliac artery and left runoff was obtained.  right runoff was performed via retrograde sheath injections.  Findings:   Aortogram: No significant renal artery stenosis was visualized.  The infrarenal abdominal aorta was widely patent.  Bilateral common and external iliac arteries are patent as well as their associated stents.  Pullback pressures were obtained from the femoral artery up to the aortic bifurcation bilaterally and no significant gradient was obtained.  Right Lower Extremity: The right common femoral artery is consistent with previous endarterectomy and patch angioplasty.  The superficial femoral artery is patent throughout its course with mild luminal narrowing in the adductor canal.  There is three-vessel runoff to the ankle  Left Lower Extremity: The left common femoral artery is widely patent with findings consistent with previous femoral endarterectomy and patch angioplasty.  The profundus widely patent.  The superficial femoral artery is patent however at the adductor canal there are several heavily calcified exophytic lesions that appear to be hemodynamically significant.  The popliteal artery is widely patent and there is three-vessel runoff to the ankle.  Intervention: After the above images were acquired decision made to proceed with intervention.  A 6 French 45 cm sheath was advanced into the left common iliac artery and the patient was fully heparinized.  Next using a 014 wire, I was able to advance a wire with some difficulty through the area of concern in the adductor canal.  These lesions were heavily calcified at least 270 degrees  around the artery.  These were exophytic.  I did not feel that primary stenting would give the desired result.  I felt that some form of additional intervention would be necessary.  Given how calcified lesions were I selected the shockwave balloon.  I used a  5 x 80 balloon and performed treatment for a total of 3 minutes.  Completion imaging showed that there was still luminal narrowing and so I felt this needed to be stented.  This was done with a 7 x 80 Eluvia stent which was postdilated with a 6 mm balloon.  Completion imaging showed widely patent superficial femoral artery.  I was very satisfied with these results.  The decision made to terminate procedure.  Catheters and wires were removed.  The groin was closed with a Mynx.  There were no immediate complications.  A total of 17 cc of contrast was utilized  Impression:  #1  Heavily calcified exophytic plaque in the adductor canal which was hemodynamically significant.  This was treated with shockwave lithotripsy using a 5 mm balloon followed by stenting with a 7 mm x 80 mm Eluvia  #2  Three-vessel runoff bilaterally  #3  Bilateral femoral endarterectomies are widely patent as are the bilateral iliac stents   V. Durene Cal, M.D., [facility] [place] Office: [phone] Pager:  [phone]

## 2023-08-08 NOTE — Interval H&P Note (Signed)
History and Physical Interval Note:  08/08/2023 10:20 AM  Kevin Gross  has presented today for surgery, with the diagnosis of iilac artery stenosis.  The various methods of treatment have been discussed with the patient and family. After consideration of risks, benefits and other options for treatment, the patient has consented to  Procedure(s): ABDOMINAL AORTOGRAM W/LOWER EXTREMITY (N/A) as a surgical intervention.  The patient's history has been reviewed, patient examined, no change in status, stable for surgery.  I have reviewed the patient's chart and labs.  Questions were answered to the patient's satisfaction.     Durene Cal

## 2023-08-10 ENCOUNTER — Inpatient Hospital Stay: Payer: Medicare PPO

## 2023-08-10 VITALS — BP 138/53 | HR 44 | Temp 98.5°F | Resp 18

## 2023-08-10 DIAGNOSIS — D509 Iron deficiency anemia, unspecified: Secondary | ICD-10-CM | POA: Diagnosis not present

## 2023-08-10 MED ORDER — IRON SUCROSE 20 MG/ML IV SOLN
200.0000 mg | Freq: Once | INTRAVENOUS | Status: AC
  Administered 2023-08-10: 200 mg via INTRAVENOUS

## 2023-08-10 NOTE — Patient Instructions (Signed)
Iron Sucrose Injection What is this medication? IRON SUCROSE (EYE ern SOO krose) treats low levels of iron (iron deficiency anemia) in people with kidney disease. Iron is a mineral that plays an important role in making red blood cells, which carry oxygen from your lungs to the rest of your body. This medicine may be used for other purposes; ask your health care provider or pharmacist if you have questions. COMMON BRAND NAME(S): Venofer What should I tell my care team before I take this medication? They need to know if you have any of these conditions: Anemia not caused by low iron levels Heart disease High levels of iron in the blood Kidney disease Liver disease An unusual or allergic reaction to iron, other medications, foods, dyes, or preservatives Pregnant or trying to get pregnant Breastfeeding How should I use this medication? This medication is for infusion into a vein. It is given in a hospital or clinic setting. Talk to your care team about the use of this medication in children. While this medication may be prescribed for children as young as 2 years for selected conditions, precautions do apply. Overdosage: If you think you have taken too much of this medicine contact a poison control center or emergency room at once. NOTE: This medicine is only for you. Do not share this medicine with others. What if I miss a dose? Keep appointments for follow-up doses. It is important not to miss your dose. Call your care team if you are unable to keep an appointment. What may interact with this medication? Do not take this medication with any of the following: Deferoxamine Dimercaprol Other iron products This medication may also interact with the following: Chloramphenicol Deferasirox This list may not describe all possible interactions. Give your health care provider a list of all the medicines, herbs, non-prescription drugs, or dietary supplements you use. Also tell them if you smoke,  drink alcohol, or use illegal drugs. Some items may interact with your medicine. What should I watch for while using this medication? Visit your care team regularly. Tell your care team if your symptoms do not start to get better or if they get worse. You may need blood work done while you are taking this medication. You may need to follow a special diet. Talk to your care team. Foods that contain iron include: whole grains/cereals, dried fruits, beans, or peas, leafy green vegetables, and organ meats (liver, kidney). What side effects may I notice from receiving this medication? Side effects that you should report to your care team as soon as possible: Allergic reactions--skin rash, itching, hives, swelling of the face, lips, tongue, or throat Low blood pressure--dizziness, feeling faint or lightheaded, blurry vision Shortness of breath Side effects that usually do not require medical attention (report to your care team if they continue or are bothersome): Flushing Headache Joint pain Muscle pain Nausea Pain, redness, or irritation at injection site This list may not describe all possible side effects. Call your doctor for medical advice about side effects. You may report side effects to FDA at 1-800-FDA-1088. Where should I keep my medication? This medication is given in a hospital or clinic. It will not be stored at home. NOTE: This sheet is a summary. It may not cover all possible information. If you have questions about this medicine, talk to your doctor, pharmacist, or health care provider.  2024 Elsevier/Gold Standard (2023-01-20 00:00:00)

## 2023-08-14 ENCOUNTER — Inpatient Hospital Stay: Payer: Medicare PPO

## 2023-08-14 VITALS — BP 122/55 | HR 43 | Temp 97.9°F | Resp 16

## 2023-08-14 DIAGNOSIS — D509 Iron deficiency anemia, unspecified: Secondary | ICD-10-CM | POA: Diagnosis not present

## 2023-08-14 MED ORDER — IRON SUCROSE 20 MG/ML IV SOLN
200.0000 mg | Freq: Once | INTRAVENOUS | Status: AC
  Administered 2023-08-14: 200 mg via INTRAVENOUS
  Filled 2023-08-14: qty 10

## 2023-08-14 MED ORDER — SODIUM CHLORIDE 0.9% FLUSH
10.0000 mL | Freq: Once | INTRAVENOUS | Status: AC | PRN
  Administered 2023-08-14: 10 mL
  Filled 2023-08-14: qty 10

## 2023-08-14 NOTE — Progress Notes (Signed)
Pt has been educated and understands. Pt declined to stay 30 mins after iron infusion. VSS.

## 2023-09-05 ENCOUNTER — Encounter: Payer: Self-pay | Admitting: *Deleted

## 2023-09-05 ENCOUNTER — Ambulatory Visit
Admission: RE | Admit: 2023-09-05 | Discharge: 2023-09-05 | Disposition: A | Discharge status: Home / Self Care | Payer: Medicare PPO | Attending: Gastroenterology | Admitting: Gastroenterology

## 2023-09-05 ENCOUNTER — Ambulatory Visit: Payer: Medicare PPO | Admitting: Anesthesiology

## 2023-09-05 ENCOUNTER — Encounter
Admission: RE | Disposition: A | Discharge status: Home / Self Care | Payer: Self-pay | Source: Home / Self Care | Attending: Gastroenterology

## 2023-09-05 DIAGNOSIS — D509 Iron deficiency anemia, unspecified: Secondary | ICD-10-CM | POA: Diagnosis present

## 2023-09-05 DIAGNOSIS — K31819 Angiodysplasia of stomach and duodenum without bleeding: Secondary | ICD-10-CM | POA: Insufficient documentation

## 2023-09-05 DIAGNOSIS — I130 Hypertensive heart and chronic kidney disease with heart failure and stage 1 through stage 4 chronic kidney disease, or unspecified chronic kidney disease: Secondary | ICD-10-CM | POA: Insufficient documentation

## 2023-09-05 DIAGNOSIS — K640 First degree hemorrhoids: Secondary | ICD-10-CM | POA: Insufficient documentation

## 2023-09-05 DIAGNOSIS — I509 Heart failure, unspecified: Secondary | ICD-10-CM | POA: Diagnosis not present

## 2023-09-05 DIAGNOSIS — K269 Duodenal ulcer, unspecified as acute or chronic, without hemorrhage or perforation: Secondary | ICD-10-CM | POA: Insufficient documentation

## 2023-09-05 DIAGNOSIS — R7303 Prediabetes: Secondary | ICD-10-CM | POA: Insufficient documentation

## 2023-09-05 DIAGNOSIS — Z87891 Personal history of nicotine dependence: Secondary | ICD-10-CM | POA: Diagnosis not present

## 2023-09-05 DIAGNOSIS — Z7902 Long term (current) use of antithrombotics/antiplatelets: Secondary | ICD-10-CM | POA: Diagnosis not present

## 2023-09-05 DIAGNOSIS — G4730 Sleep apnea, unspecified: Secondary | ICD-10-CM | POA: Insufficient documentation

## 2023-09-05 DIAGNOSIS — I2510 Atherosclerotic heart disease of native coronary artery without angina pectoris: Secondary | ICD-10-CM | POA: Diagnosis not present

## 2023-09-05 DIAGNOSIS — K5730 Diverticulosis of large intestine without perforation or abscess without bleeding: Secondary | ICD-10-CM | POA: Insufficient documentation

## 2023-09-05 DIAGNOSIS — N183 Chronic kidney disease, stage 3 unspecified: Secondary | ICD-10-CM | POA: Diagnosis not present

## 2023-09-05 DIAGNOSIS — I739 Peripheral vascular disease, unspecified: Secondary | ICD-10-CM | POA: Diagnosis not present

## 2023-09-05 HISTORY — DX: Idiopathic chronic gout, unspecified site, without tophus (tophi): M1A.00X0

## 2023-09-05 HISTORY — DX: Chronic gout, unspecified, without tophus (tophi): M1A.9XX0

## 2023-09-05 HISTORY — PX: COLONOSCOPY WITH PROPOFOL: SHX5780

## 2023-09-05 HISTORY — PX: ENTEROSCOPY: SHX5533

## 2023-09-05 HISTORY — DX: Hyperuricemia without signs of inflammatory arthritis and tophaceous disease: E79.0

## 2023-09-05 SURGERY — COLONOSCOPY WITH PROPOFOL
Anesthesia: General

## 2023-09-05 MED ORDER — PROPOFOL 500 MG/50ML IV EMUL
INTRAVENOUS | Status: DC | PRN
  Administered 2023-09-05: 125 ug/kg/min via INTRAVENOUS

## 2023-09-05 MED ORDER — PROPOFOL 10 MG/ML IV BOLUS
INTRAVENOUS | Status: DC | PRN
  Administered 2023-09-05 (×2): 50 mg via INTRAVENOUS
  Administered 2023-09-05: 20 mg via INTRAVENOUS
  Administered 2023-09-05: 30 mg via INTRAVENOUS
  Administered 2023-09-05: 50 mg via INTRAVENOUS
  Administered 2023-09-05 (×3): 30 mg via INTRAVENOUS

## 2023-09-05 MED ORDER — GLYCOPYRROLATE 0.2 MG/ML IJ SOLN
INTRAMUSCULAR | Status: DC | PRN
  Administered 2023-09-05: .2 mg via INTRAVENOUS

## 2023-09-05 MED ORDER — SODIUM CHLORIDE 0.9 % IV SOLN: INTRAVENOUS | Status: DC

## 2023-09-05 MED ORDER — PROPOFOL 1000 MG/100ML IV EMUL
INTRAVENOUS | Status: AC
  Filled 2023-09-05: qty 100

## 2023-09-05 MED ORDER — EPHEDRINE SULFATE-NACL 50-0.9 MG/10ML-% IV SOSY
PREFILLED_SYRINGE | INTRAVENOUS | Status: DC | PRN
  Administered 2023-09-05: 2.5 mg via INTRAVENOUS
  Administered 2023-09-05 (×3): 7.5 mg via INTRAVENOUS

## 2023-09-05 MED ORDER — LIDOCAINE HCL (CARDIAC) PF 100 MG/5ML IV SOSY
PREFILLED_SYRINGE | INTRAVENOUS | Status: DC | PRN
  Administered 2023-09-05: 100 mg via INTRAVENOUS

## 2023-09-05 NOTE — Op Note (Signed)
 [facility] Gastroenterology Patient Name: Kevin Gross Procedure Date: 09/05/2023 9:09 AM MRN: [id number] Account #: [id number] Date of Birth: 1956-06-27 Admit Type: Outpatient Age: 68 Room: [facility] ENDO ROOM 1 Gender: Male Note Status: Finalized Instrument Name: Peds Colonoscope [id number] Procedure:             Small bowel enteroscopy Indications:           Iron  deficiency anemia Providers:             Ole Schick MD, MD Referring MD:          No Local Md, MD (Referring MD) Medicines:             Monitored Anesthesia Care Complications:         No immediate complications. Procedure:             Pre-Anesthesia Assessment:                        - Prior to the procedure, a History and Physical was                         performed, and patient medications and allergies were                         reviewed. The patient is competent. The risks and                         benefits of the procedure and the sedation options and                         risks were discussed with the patient. All questions                         were answered and informed consent was obtained.                         Patient identification and proposed procedure were                         verified by the physician, the nurse, the                         anesthesiologist, the anesthetist and the technician                         in the endoscopy suite. Mental Status Examination:                         alert and oriented. Airway Examination: normal                         oropharyngeal airway and neck mobility. Respiratory                         Examination: clear to auscultation. CV Examination:                         normal. Prophylactic Antibiotics: The patient does not  require prophylactic antibiotics. Prior                         Anticoagulants: The patient has taken Plavix                          (clopidogrel ), last dose was 7 days prior to                          procedure. ASA Grade Assessment: III - A patient with                         severe systemic disease. After reviewing the risks and                         benefits, the patient was deemed in satisfactory                         condition to undergo the procedure. The anesthesia                         plan was to use monitored anesthesia care (MAC).                         Immediately prior to administration of medications,                         the patient was re-assessed for adequacy to receive                         sedatives. The heart rate, respiratory rate, oxygen                         saturations, blood pressure, adequacy of pulmonary                         ventilation, and response to care were monitored                         throughout the procedure. The physical status of the                         patient was re-assessed after the procedure.                        After obtaining informed consent, the endoscope was                         passed under direct vision. Throughout the procedure,                         the patient's blood pressure, pulse, and oxygen                         saturations were monitored continuously. The                         Colonoscope was introduced through the mouth and  advanced to the mid-jejunum. The small bowel                         enteroscopy was accomplished without difficulty. The                         patient tolerated the procedure well. Findings:      One non-bleeding superficial duodenal ulcer with a clean ulcer base       (Forrest Class III) was found in the second portion of the duodenum. The       lesion was less than one mm in largest dimension. Coagulation for tissue       destruction using argon beam was successful. Estimated blood loss: none.      A single angioectasia with no bleeding was found in the first portion of       the duodenum. Coagulation for tissue destruction using  argon beam was       successful. Estimated blood loss: none.      A single small angioectasia with no bleeding was found in the gastric       body. Coagulation for tissue destruction using argon beam was       successful. Estimated blood loss: none.      The exam of the stomach was otherwise normal.      The examined esophagus was normal. Impression:            - Non-bleeding duodenal ulcer with a clean ulcer base                         (Forrest Class III). Treated with argon beam                         coagulation.                        - A single non-bleeding angioectasia in the duodenum.                         Treated with argon beam coagulation.                        - A single non-bleeding angioectasia in the stomach.                         Treated with argon beam coagulation.                        - Normal esophagus.                        - No specimens collected. Recommendation:        - Discharge patient to home.                        - Resume previous diet.                        - Resume Plavix  (clopidogrel ) at prior dose today.                        - Return to referring physician as previously  scheduled. Procedure Code(s):     --- Professional ---                        [phone], Small intestinal endoscopy, enteroscopy beyond                         second portion of duodenum, not including ileum; with                         ablation of tumor(s), polyp(s), or other lesion(s) not                         amenable to removal by hot biopsy forceps, bipolar                         cautery or snare technique Diagnosis Code(s):     --- Professional ---                        K26.9, Duodenal ulcer, unspecified as acute or                         chronic, without hemorrhage or perforation                        K31.819, Angiodysplasia of stomach and duodenum                         without bleeding                        D50.9, Iron  deficiency  anemia, unspecified CPT copyright 2022 American Medical Association. All rights reserved. The codes documented in this report are preliminary and upon coder review may  be revised to meet current compliance requirements. Ole Schick MD, MD 09/05/2023 9:56:17 AM Number of Addenda: 0 Note Initiated On: 09/05/2023 9:09 AM Estimated Blood Loss:  Estimated blood loss: none.      [facility]

## 2023-09-05 NOTE — H&P (Signed)
 Outpatient short stay form Pre-procedure 09/05/2023  Kevin ONEIDA Schick, MD  Primary Physician: Autry Grayce LABOR, PA  Reason for visit:  IDA  History of present illness:    68 y/o gentleman with history of PVD, HTN, COPD, and OSA here for push enteroscopy for avm's seen on VCE and colonoscopy for IDA. The last colonoscopy was 1 year ago with fair prep. Takes plavix  with last dose 7 days ago. No significant abdominal surgeries. No family history of GI malignancies.    Current Facility-Administered Medications:    0.9 %  sodium chloride  infusion, , Intravenous, Continuous, Jeannene Tschetter, Kevin ONEIDA, MD, Last Rate: 20 mL/hr at 09/05/23 0824, New Bag at 09/05/23 0824  Medications Prior to Admission  Medication Sig Dispense Refill Last Dose/Taking   albuterol  (VENTOLIN  HFA) 108 (90 Base) MCG/ACT inhaler Inhale 2 puffs into the lungs every 6 (six) hours as needed for wheezing or shortness of breath.   Past Month   allopurinol  (ZYLOPRIM ) 300 MG tablet Take 600 mg by mouth daily.   09/05/2023 Morning   amiodarone  (PACERONE ) 200 MG tablet Take 200 mg by mouth 2 (two) times daily.   09/05/2023 Morning   aspirin  EC 81 MG tablet Take 81 mg by mouth every morning.   09/05/2023 Morning   atenolol  (TENORMIN ) 25 MG tablet Take 25 mg by mouth daily.   09/05/2023 Morning   chlorthalidone  (HYGROTON ) 25 MG tablet Take 25 mg by mouth daily.   09/05/2023 Morning   Cholecalciferol  (VITAMIN D3) 25 MCG (1000 UT) CAPS Take 2,000 Units by mouth daily.   Past Week   cyanocobalamin  (VITAMIN B12) 500 MCG tablet Take 500 mcg by mouth daily.   Past Week   iron  polysaccharides (NIFEREX) 150 MG capsule Take 1 capsule (150 mg total) by mouth daily. 90 capsule 0 09/05/2023   lisinopril  (ZESTRIL ) 10 MG tablet Take 10 mg by mouth daily.   09/05/2023 Morning   simvastatin  (ZOCOR ) 20 MG tablet Take 20 mg by mouth in the morning.   09/04/2023   zolpidem  (AMBIEN ) 10 MG tablet Take 10 mg by mouth at bedtime.   09/04/2023   clopidogrel  (PLAVIX ) 75 MG  tablet Take 1 tablet (75 mg total) by mouth daily. 30 tablet 11 08/29/2023   ezetimibe  (ZETIA ) 10 MG tablet Take 1 tablet (10 mg total) by mouth daily. 90 tablet 3    triamcinolone  ointment (KENALOG ) 0.1 % Apply 1 Application topically 2 (two) times daily as needed (psoriasis).        Allergies  Allergen Reactions   Jardiance [Empagliflozin]     Loss of balance, hypoglycemic      Past Medical History:  Diagnosis Date   Alcoholic cirrhosis of liver (HCC)    Anemia 2024   Cataract 2024   CHF (congestive heart failure) (HCC)    Chronic gouty arthritis    CKD (chronic kidney disease), stage III (HCC)    Clotting disorder (HCC)    Bilateral legs-pt states plaque build up in both legs requiring intervention   Complication of anesthesia    pt states that years ago during colonoscopy at [facility] they had to abort the procedure due to some cardiac issue had to see cardiologist   COPD (chronic obstructive pulmonary disease) (HCC)    Hyperlipidemia    Hypertension    Hyperuricemia    Pre-diabetes    Prostate cancer (HCC) 2023   Sleep apnea    uses cpap    Review of systems:  Otherwise negative.    Physical Exam  Gen: Alert, oriented. Appears stated age.  HEENT: PERRLA. Lungs: No respiratory distress CV: RRR Abd: soft, benign, no masses Ext: No edema    Planned procedures: Proceed with Push enteroscopy/colonoscopy. The patient understands the nature of the planned procedure, indications, risks, alternatives and potential complications including but not limited to bleeding, infection, perforation, damage to internal organs and possible oversedation/side effects from anesthesia. The patient agrees and gives consent to proceed.  Please refer to procedure notes for findings, recommendations and patient disposition/instructions.     Kevin ONEIDA Schick, MD [facility] Gastroenterology

## 2023-09-05 NOTE — Interval H&P Note (Signed)
 History and Physical Interval Note:  09/05/2023 9:10 AM  Kevin Gross  has presented today for surgery, with the diagnosis of IDA.  The various methods of treatment have been discussed with the patient and family. After consideration of risks, benefits and other options for treatment, the patient has consented to  Procedure(s): COLONOSCOPY WITH PROPOFOL  (N/A) ENTEROSCOPY (N/A) as a surgical intervention.  The patient's history has been reviewed, patient examined, no change in status, stable for surgery.  I have reviewed the patient's chart and labs.  Questions were answered to the patient's satisfaction.     [name]  Ok to proceed with push enteroscopy and colonoscopy

## 2023-09-05 NOTE — Op Note (Signed)
 [facility] Patient Name: Kevin Gross Procedure Date: 09/05/2023 9:08 AM MRN: [id number] Account #: [id number] Date of Birth: Apr 04, 1956 Admit Type: Outpatient Age: 68 Room: [facility] ENDO ROOM 1 Gender: Male Note Status: Finalized Instrument Name: Peds Colonoscope [id number] Procedure:             Colonoscopy Indications:           Iron  deficiency anemia Providers:             Ole Schick MD, MD Referring MD:          No Local Md, MD (Referring MD) Medicines:             Monitored Anesthesia Care Complications:         No immediate complications. Procedure:             Pre-Anesthesia Assessment:                        - Prior to the procedure, a History and Physical was                         performed, and patient medications and allergies were                         reviewed. The patient is competent. The risks and                         benefits of the procedure and the sedation options and                         risks were discussed with the patient. All questions                         were answered and informed consent was obtained.                         Patient identification and proposed procedure were                         verified by the physician, the nurse, the                         anesthesiologist, the anesthetist and the technician                         in the endoscopy suite. Mental Status Examination:                         alert and oriented. Airway Examination: normal                         oropharyngeal airway and neck mobility. Respiratory                         Examination: clear to auscultation. CV Examination:                         normal. Prophylactic Antibiotics: The patient does not  require prophylactic antibiotics. Prior                         Anticoagulants: The patient has taken Plavix                          (clopidogrel ), last dose was 7 days prior to                          procedure. ASA Grade Assessment: III - A patient with                         severe systemic disease. After reviewing the risks and                         benefits, the patient was deemed in satisfactory                         condition to undergo the procedure. The anesthesia                         plan was to use monitored anesthesia care (MAC).                         Immediately prior to administration of medications,                         the patient was re-assessed for adequacy to receive                         sedatives. The heart rate, respiratory rate, oxygen                         saturations, blood pressure, adequacy of pulmonary                         ventilation, and response to care were monitored                         throughout the procedure. The physical status of the                         patient was re-assessed after the procedure.                        After obtaining informed consent, the colonoscope was                         passed under direct vision. Throughout the procedure,                         the patient's blood pressure, pulse, and oxygen                         saturations were monitored continuously. The                         Colonoscope was introduced through the anus and  advanced to the the terminal ileum, with                         identification of the appendiceal orifice and IC                         valve. The colonoscopy was performed without                         difficulty. The patient tolerated the procedure well.                         The quality of the bowel preparation was good except                         the cecum was fair. The terminal ileum, ileocecal                         valve, appendiceal orifice, and rectum were                         photographed. Findings:      The perianal and digital rectal examinations were normal.      The terminal ileum appeared normal.      Scattered  small-mouthed diverticula were found in the sigmoid colon,       descending colon, transverse colon, hepatic flexure and ascending colon.      Internal hemorrhoids were found during retroflexion. The hemorrhoids       were Grade I (internal hemorrhoids that do not prolapse).      The exam was otherwise without abnormality on direct and retroflexion       views. Impression:            - The examined portion of the ileum was normal.                        - Diverticulosis in the sigmoid colon, in the                         descending colon, in the transverse colon, at the                         hepatic flexure and in the ascending colon.                        - Internal hemorrhoids.                        - The examination was otherwise normal on direct and                         retroflexion views.                        - No specimens collected. Recommendation:        - Discharge patient to home.                        - Resume previous diet.                        -  Resume Plavix  (clopidogrel ) at prior dose today.                        - Repeat colonoscopy can be discussed in clinic                         because the bowel preparation was suboptimal.                        - Maximize iron  therapy, check regular cbc's, consider                         heme/onc eval if persistent IDA Procedure Code(s):     --- Professional ---                        [id number], Colonoscopy, flexible; diagnostic, including                         collection of specimen(s) by brushing or washing, when                         performed (separate procedure) Diagnosis Code(s):     --- Professional ---                        K64.0, First degree hemorrhoids                        D50.9, Iron  deficiency anemia, unspecified                        K57.30, Diverticulosis of large intestine without                         perforation or abscess without bleeding CPT copyright 2022 American [employer] Association. All  rights reserved. The codes documented in this report are preliminary and upon coder review may  be revised to meet current compliance requirements. Ole Schick MD, MD 09/05/2023 10:01:10 AM Number of Addenda: 0 Note Initiated On: 09/05/2023 9:08 AM Scope Withdrawal Time: 0 hours 6 minutes 40 seconds  Total Procedure Duration: 0 hours 9 minutes 20 seconds  Estimated Blood Loss:  Estimated blood loss: none.      [facility]

## 2023-09-05 NOTE — Transfer of Care (Signed)
 Immediate Anesthesia Transfer of Care Note  Patient: Kevin Gross  Procedure(s) Performed: COLONOSCOPY WITH PROPOFOL  ENTEROSCOPY  Patient Location: PACU  Anesthesia Type:General  Level of Consciousness: drowsy  Airway & Oxygen Therapy: Patient Spontanous Breathing  Post-op Assessment: Report given to RN and Post -op Vital signs reviewed and stable  Post vital signs: Reviewed and stable  Last Vitals:  Vitals Value Taken Time  BP 98/51 09/05/23 0946  Temp 36.1 C 09/05/23 0945  Pulse 84 09/05/23 0949  Resp 23 09/05/23 0949  SpO2 99 % 09/05/23 0949  Vitals shown include unfiled device data.  Last Pain:  Vitals:   09/05/23 0945  TempSrc: Temporal         Complications: No notable events documented.

## 2023-09-05 NOTE — Anesthesia Preprocedure Evaluation (Addendum)
 Anesthesia Evaluation  Patient identified by MRN, date of birth, ID band Patient awake    Reviewed: Allergy & Precautions, NPO status , Patient's Chart, lab work & pertinent test results  History of Anesthesia Complications Negative for: history of anesthetic complications  Airway Mallampati: III   Neck ROM: Full    Dental  (+) Missing   Pulmonary sleep apnea and Continuous Positive Airway Pressure Ventilation , former smoker (quit 2016)   Pulmonary exam normal breath sounds clear to auscultation       Cardiovascular hypertension, + CAD and + Peripheral Vascular Disease (on Plavix )  Normal cardiovascular exam Rhythm:Regular Rate:Normal  ECG 08/04/23: Sinus bradycardia (HR 47)   Neuro/Psych Hx alcohol use disorder, none in several years    GI/Hepatic negative GI ROS,,,  Endo/Other  Obesity; prediabetes  Renal/GU Renal disease (stage III CKD)   Prostate CA    Musculoskeletal   Abdominal   Peds  Hematology  (+) Blood dyscrasia, anemia   Anesthesia Other Findings   Reproductive/Obstetrics                             Anesthesia Physical Anesthesia Plan  ASA: 3  Anesthesia Plan: General   Post-op Pain Management:    Induction: Intravenous  PONV Risk Score and Plan: 2 and Propofol  infusion, TIVA and Treatment may vary due to age or medical condition  Airway Management Planned: Natural Airway  Additional Equipment:   Intra-op Plan:   Post-operative Plan:   Informed Consent: I have reviewed the patients History and Physical, chart, labs and discussed the procedure including the risks, benefits and alternatives for the proposed anesthesia with the patient or authorized representative who has indicated his/her understanding and acceptance.       Plan Discussed with: CRNA  Anesthesia Plan Comments: (LMA/GETA backup discussed.  Patient consented for risks of anesthesia including  but not limited to:  - adverse reactions to medications - damage to eyes, teeth, lips or other oral mucosa - nerve damage due to positioning  - sore throat or hoarseness - damage to heart, brain, nerves, lungs, other parts of body or loss of life  Informed patient about role of CRNA in peri- and intra-operative care.  Patient voiced understanding.)        Anesthesia Quick Evaluation

## 2023-09-05 NOTE — Anesthesia Postprocedure Evaluation (Signed)
 Anesthesia Post Note  Patient: TIMOTY BOURKE  Procedure(s) Performed: COLONOSCOPY WITH PROPOFOL  ENTEROSCOPY  Patient location during evaluation: PACU Anesthesia Type: General Level of consciousness: awake and alert, oriented and patient cooperative Pain management: pain level controlled Vital Signs Assessment: post-procedure vital signs reviewed and stable Respiratory status: spontaneous breathing, nonlabored ventilation and respiratory function stable Cardiovascular status: blood pressure returned to baseline and stable Postop Assessment: adequate PO intake Anesthetic complications: no   No notable events documented.   Last Vitals:  Vitals:   09/05/23 0945 09/05/23 0955  BP: (!) 98/51 120/63  Pulse: 79   Resp: 12   Temp: (!) 36.1 C   SpO2: 98%     Last Pain:  Vitals:   09/05/23 1005  TempSrc:   PainSc: 0-No pain                 Alfonso Ruths

## 2023-09-13 ENCOUNTER — Other Ambulatory Visit: Payer: Self-pay | Admitting: *Deleted

## 2023-09-13 DIAGNOSIS — I739 Peripheral vascular disease, unspecified: Secondary | ICD-10-CM

## 2023-09-13 DIAGNOSIS — I70213 Atherosclerosis of native arteries of extremities with intermittent claudication, bilateral legs: Secondary | ICD-10-CM

## 2023-09-13 DIAGNOSIS — Z95828 Presence of other vascular implants and grafts: Secondary | ICD-10-CM

## 2023-09-18 ENCOUNTER — Ambulatory Visit (INDEPENDENT_AMBULATORY_CARE_PROVIDER_SITE_OTHER)
Admission: RE | Admit: 2023-09-18 | Discharge: 2023-09-18 | Disposition: A | Discharge status: Home / Self Care | Payer: Medicare PPO | Source: Ambulatory Visit | Attending: Surgery | Admitting: Surgery

## 2023-09-18 ENCOUNTER — Ambulatory Visit (HOSPITAL_COMMUNITY)
Admission: RE | Admit: 2023-09-18 | Discharge: 2023-09-18 | Disposition: A | Discharge status: Home / Self Care | Payer: Medicare PPO | Source: Ambulatory Visit | Attending: Surgery | Admitting: Surgery

## 2023-09-18 ENCOUNTER — Ambulatory Visit (INDEPENDENT_AMBULATORY_CARE_PROVIDER_SITE_OTHER): Payer: Medicare PPO | Admitting: Physician Assistant

## 2023-09-18 VITALS — BP 147/78 | HR 48 | Temp 98.4°F | Resp 18 | Ht 70.0 in | Wt 268.0 lb

## 2023-09-18 DIAGNOSIS — Z95828 Presence of other vascular implants and grafts: Secondary | ICD-10-CM | POA: Insufficient documentation

## 2023-09-18 DIAGNOSIS — I70213 Atherosclerosis of native arteries of extremities with intermittent claudication, bilateral legs: Secondary | ICD-10-CM | POA: Insufficient documentation

## 2023-09-18 DIAGNOSIS — I739 Peripheral vascular disease, unspecified: Secondary | ICD-10-CM

## 2023-09-18 LAB — VAS US ABI WITH/WO TBI
Left ABI: 1.1
Right ABI: 1.14

## 2023-09-18 NOTE — Progress Notes (Signed)
Office Note     CC:  follow up Requesting Provider:  Gildardo Pounds, PA  HPI: Kevin Gross is a 68 y.o. (06-20-1956) male who presents status post shockwave lithotripsy of the left SFA and stenting by Dr. Myra Gianotti on 08/08/2023 due to lifestyle limiting claudication.  Surgical history significant for bilateral femoral endarterectomies with iliac stenting.  Patient reports claudication symptoms of the left leg have completely resolved since SFA intervention.  He denies rest pain or tissue loss of bilateral lower extremities.  He believes the right groin catheterization site has completely healed.  He is on aspirin, Plavix, statin daily.  He quit smoking in 2016.   Past Medical History:  Diagnosis Date   Alcoholic cirrhosis of liver (HCC)    Anemia 2024   Cataract 2024   CHF (congestive heart failure) (HCC)    Chronic gouty arthritis    CKD (chronic kidney disease), stage III (HCC)    Clotting disorder (HCC)    Bilateral legs-pt states plaque build up in both legs requiring intervention   Complication of anesthesia    pt states that years ago during colonoscopy at [facility] they had to abort the procedure due to some "cardiac issue" had to see cardiologist   COPD (chronic obstructive pulmonary disease) (HCC)    Hyperlipidemia    Hypertension    Hyperuricemia    Peripheral arterial disease (HCC)    Pre-diabetes    Prostate cancer (HCC) 2023   Sleep apnea    uses cpap    Past Surgical History:  Procedure Laterality Date   ABDOMINAL AORTOGRAM W/LOWER EXTREMITY N/A 10/20/2021   Procedure: ABDOMINAL AORTOGRAM W/LOWER EXTREMITY;  Surgeon: Iran Ouch, MD;  Location: [facility];  Service: Cardiovascular;  Laterality: N/A;   ABDOMINAL AORTOGRAM W/LOWER EXTREMITY N/A 08/08/2023   Procedure: ABDOMINAL AORTOGRAM W/LOWER EXTREMITY;  Surgeon: Nada Libman, MD;  Location: [facility];  Service: Cardiovascular;  Laterality: N/A;   ALLOGRAFT APPLICATION Bilateral  12/22/2021   Procedure: ALLOGRAFT APPLICATION;  Surgeon: Nada Libman, MD;  Location: [facility] OR;  Service: Vascular;  Laterality: Bilateral;   ANGIOPLASTY Left 11/26/2021   Procedure: LEFT INFRARENAL LITHOTRIPSY;  Surgeon: Nada Libman, MD;  Location: [facility] OR;  Service: Vascular;  Laterality: Left;   APPLICATION OF WOUND VAC Bilateral 12/13/2021   Procedure: APPLICATION OF WOUND VAC;  Surgeon: Victorino Sparrow, MD;  Location: [facility] OR;  Service: Vascular;  Laterality: Bilateral;   CARDIAC CATHETERIZATION     [facility]   CATARACT EXTRACTION W/PHACO Left 04/04/2023   Procedure: CATARACT EXTRACTION PHACO AND INTRAOCULAR LENS PLACEMENT (IOC) LEFT 8.71 00:45.5;  Surgeon: Galen Manila, MD;  Location: [facility];  Service: Ophthalmology;  Laterality: Left;   CATARACT EXTRACTION W/PHACO Right 04/18/2023   Procedure: CATARACT EXTRACTION PHACO AND INTRAOCULAR LENS PLACEMENT (IOC) RIGHT 4.31 00:8.2;  Surgeon: Galen Manila, MD;  Location: [facility];  Service: Ophthalmology;  Laterality: Right;   COLONOSCOPY     COLONOSCOPY WITH PROPOFOL N/A 01/17/2018   Procedure: COLONOSCOPY WITH PROPOFOL;  Surgeon: Toledo, Boykin Nearing, MD;  Location: [facility] ENDOSCOPY;  Service: Gastroenterology;  Laterality: N/A;   COLONOSCOPY WITH PROPOFOL N/A 09/12/2022   Procedure: COLONOSCOPY WITH PROPOFOL;  Surgeon: Regis Bill, MD;  Location: [facility] ENDOSCOPY;  Service: Endoscopy;  Laterality: N/A;   COLONOSCOPY WITH PROPOFOL N/A 09/05/2023   Procedure: COLONOSCOPY WITH PROPOFOL;  Surgeon: Regis Bill, MD;  Location: [facility] ENDOSCOPY;  Service: Endoscopy;  Laterality: N/A;   ENDARTERECTOMY FEMORAL  Bilateral 11/26/2021   Procedure: BILATERAL FEMORAL ENDARTERECTOMY WITH VEIN PATCH ANGIOPLASTY;  Surgeon: Nada Libman, MD;  Location: [facility] OR;  Service: Vascular;  Laterality: Bilateral;   ENTEROSCOPY N/A 09/05/2023   Procedure: ENTEROSCOPY;  Surgeon: Regis Bill, MD;  Location: [facility] ENDOSCOPY;   Service: Endoscopy;  Laterality: N/A;   ESOPHAGOGASTRODUODENOSCOPY (EGD) WITH PROPOFOL N/A 01/17/2018   Procedure: ESOPHAGOGASTRODUODENOSCOPY (EGD) WITH PROPOFOL;  Surgeon: Toledo, Boykin Nearing, MD;  Location: [facility] ENDOSCOPY;  Service: Gastroenterology;  Laterality: N/A;   ESOPHAGOGASTRODUODENOSCOPY (EGD) WITH PROPOFOL N/A 09/12/2022   Procedure: ESOPHAGOGASTRODUODENOSCOPY (EGD) WITH PROPOFOL;  Surgeon: Regis Bill, MD;  Location: [facility] ENDOSCOPY;  Service: Endoscopy;  Laterality: N/A;   EYE SURGERY     GROIN DEBRIDEMENT Bilateral 12/16/2021   Procedure: IRRIGATION AND DEBRIDEMENT OF BILATERAL GROINS;  Surgeon: Nada Libman, MD;  Location: [facility] OR;  Service: Vascular;  Laterality: Bilateral;   INCISION AND DRAINAGE OF WOUND Bilateral 12/13/2021   Procedure: IRRIGATION AND DEBRIDEMENT OF BILATERAL GROIN WOUNDS;  Surgeon: Victorino Sparrow, MD;  Location: [facility] OR;  Service: Vascular;  Laterality: Bilateral;   INSERTION OF ILIAC STENT Bilateral 11/26/2021   Procedure: BILATERAL ILIAC STENTING USING 94mmX80mm INNOVA STENT ON LEFT ILIAC AND 29mmX59mm, 33mmX79mm VBX AND 43mmX7.5cm VIABAHN STENT ON RIGHT ILIAC;  Surgeon: Nada Libman, MD;  Location: [facility] OR;  Service: Vascular;  Laterality: Bilateral;   INSERTION OF SEEDS IN PROSTATE     ORIF ANKLE FRACTURE Right    PERIPHERAL INTRAVASCULAR LITHOTRIPSY  08/08/2023   Procedure: PERIPHERAL INTRAVASCULAR LITHOTRIPSY;  Surgeon: Nada Libman, MD;  Location: [facility];  Service: Cardiovascular;;   PROSTATE BIOPSY N/A 04/01/2021   Procedure: PROSTATE BIOPSY Addison Bailey;  Surgeon: Orson Ape, MD;  Location: [facility] ORS;  Service: Urology;  Laterality: N/A;   VEIN HARVEST Bilateral 11/26/2021   Procedure: VEIN HARVEST OF BILATERAL SPAHENOUS VEINS;  Surgeon: Nada Libman, MD;  Location: [facility] OR;  Service: Vascular;  Laterality: Bilateral;   WOUND DEBRIDEMENT Bilateral 12/22/2021   Procedure: INCISION AND DEBRIDEMENT OF BILATERAL GROINS;  Surgeon:  Nada Libman, MD;  Location: [facility] OR;  Service: Vascular;  Laterality: Bilateral;    Social History   Socioeconomic History   Marital status: Married    Spouse name: Adelle   Number of children: 2   Years of education: Not on file   Highest education level: Not on file  Occupational History   Occupation: disabled  Tobacco Use   Smoking status: Former    Current packs/day: 0.00    Average packs/day: 1 pack/day for 47.0 years (47.0 ttl pk-yrs)    Types: Cigarettes    Start date: 55    Quit date: 2016    Years since quitting: 9.0    Passive exposure: Never   Smokeless tobacco: Never  Vaping Use   Vaping status: Never Used  Substance and Sexual Activity   Alcohol use: Not Currently   Drug use: No   Sexual activity: Not Currently    Birth control/protection: Other-see comments    Comment: ED  Other Topics Concern   Not on file  Social History Narrative   Not on file   Social Drivers of Health   Financial Resource Strain: Low Risk  (07/17/2023)   Overall Financial Resource Strain (CARDIA)    Difficulty of Paying Living Expenses: Not hard at all  Food Insecurity: No Food Insecurity (07/17/2023)   Hunger Vital Sign    Worried About Running Out of Food in the  Last Year: Never true    Ran Out of Food in the Last Year: Never true  Transportation Needs: No Transportation Needs (07/17/2023)   PRAPARE - Administrator, Civil Service (Medical): No    Lack of Transportation (Non-Medical): No  Physical Activity: Not on file  Stress: Not on file  Social Connections: Not on file  Intimate Partner Violence: Not At Risk (07/17/2023)   Humiliation, Afraid, Rape, and Kick questionnaire    Fear of Current or Ex-Partner: No    Emotionally Abused: No    Physically Abused: No    Sexually Abused: No    Family History  Problem Relation Age of Onset   Heart disease Mother    Heart disease Brother     Current Outpatient Medications  Medication Sig Dispense Refill    albuterol (VENTOLIN HFA) 108 (90 Base) MCG/ACT inhaler Inhale 2 puffs into the lungs every 6 (six) hours as needed for wheezing or shortness of breath.     allopurinol (ZYLOPRIM) 300 MG tablet Take 600 mg by mouth daily.     amiodarone (PACERONE) 200 MG tablet Take 200 mg by mouth 2 (two) times daily.     aspirin EC 81 MG tablet Take 81 mg by mouth every morning.     atenolol (TENORMIN) 25 MG tablet Take 25 mg by mouth daily.     chlorthalidone (HYGROTON) 25 MG tablet Take 25 mg by mouth daily.     Cholecalciferol (VITAMIN D3) 25 MCG (1000 UT) CAPS Take 2,000 Units by mouth daily.     clopidogrel (PLAVIX) 75 MG tablet Take 1 tablet (75 mg total) by mouth daily. 30 tablet 11   cyanocobalamin (VITAMIN B12) 500 MCG tablet Take 500 mcg by mouth daily.     ezetimibe (ZETIA) 10 MG tablet Take 1 tablet (10 mg total) by mouth daily. 90 tablet 3   lisinopril (ZESTRIL) 10 MG tablet Take 10 mg by mouth daily.     simvastatin (ZOCOR) 20 MG tablet Take 20 mg by mouth in the morning.     triamcinolone ointment (KENALOG) 0.1 % Apply 1 Application topically 2 (two) times daily as needed (psoriasis).     zolpidem (AMBIEN) 10 MG tablet Take 10 mg by mouth at bedtime.     iron polysaccharides (NIFEREX) 150 MG capsule Take 1 capsule (150 mg total) by mouth daily. 90 capsule 0   No current facility-administered medications for this visit.    Allergies  Allergen Reactions   Jardiance [Empagliflozin]     Loss of balance, hypoglycemic      REVIEW OF SYSTEMS:   [X]  denotes positive finding, [ ]  denotes negative finding Cardiac  Comments:  Chest pain or chest pressure:    Shortness of breath upon exertion:    Short of breath when lying flat:    Irregular heart rhythm:        Vascular    Pain in calf, thigh, or hip brought on by ambulation:    Pain in feet at night that wakes you up from your sleep:     Blood clot in your veins:    Leg swelling:         Pulmonary    Oxygen at home:    Productive  cough:     Wheezing:         Neurologic    Sudden weakness in arms or legs:     Sudden numbness in arms or legs:     Sudden onset of difficulty  speaking or slurred speech:    Temporary loss of vision in one eye:     Problems with dizziness:         Gastrointestinal    Blood in stool:     Vomited blood:         Genitourinary    Burning when urinating:     Blood in urine:        Psychiatric    Major depression:         Hematologic    Bleeding problems:    Problems with blood clotting too easily:        Skin    Rashes or ulcers:        Constitutional    Fever or chills:      PHYSICAL EXAMINATION:  Vitals:   09/18/23 1206  BP: (!) 147/78  Pulse: (!) 48  Resp: 18  Temp: 98.4 F (36.9 C)  TempSrc: Temporal  SpO2: 99%  Weight: 268 lb (121.6 kg)  Height: 5\' 10"  (1.778 m)    General:  WDWN in NAD; vital signs documented above Gait: Not observed HENT: WNL, normocephalic Pulmonary: normal non-labored breathing , without Rales, rhonchi,  wheezing Cardiac: regular HR Abdomen: soft, NT, no masses Skin: without rashes Vascular Exam/Pulses: 1+ right PT; 2+ left DP Extremities: without ischemic changes, without Gangrene , without cellulitis; without open wounds; right groin access site without hematoma Musculoskeletal: no muscle wasting or atrophy  Neurologic: A&O X 3 Psychiatric:  The pt has Normal affect.   Non-Invasive Vascular Imaging:   Left lower extremity arterial duplex is without any hemodynamically significant stenosis  ABI/TBIToday's ABIToday's TBIPrevious ABIPrevious TBI  +-------+-----------+-----------+------------+------------+  Right 1.14       1.58       1.14        0.51          +-------+-----------+-----------+------------+------------+  Left  1.10       0.72       [place]            +-------+-----------+-----------+------------+------------+     ASSESSMENT/PLAN:: 68 y.o. male status post shockwave lithotripsy and  stenting of the left SFA due to disabling claudication  Subjectively, claudication symptoms of the left lower extremity have completely resolved since SFA intervention.  He has a 2+ palpable left DP pulse.  Duplex of the left lower extremity is without any hemodynamically significant stenosis.  Encouraged patient to walk is much as possible for exercise.  He will continue his aspirin, Plavix, statin daily.  We will repeat aortoiliac duplex as well as left lower extremity arterial duplex and ABI in 6 months.   Emilie Rutter, PA-C [facility] [phone]  Clinic MD:   Myra Gianotti

## 2023-10-10 ENCOUNTER — Other Ambulatory Visit: Payer: Self-pay

## 2023-10-10 DIAGNOSIS — I739 Peripheral vascular disease, unspecified: Secondary | ICD-10-CM

## 2023-11-08 ENCOUNTER — Ambulatory Visit
Admission: RE | Admit: 2023-11-08 | Discharge: 2023-11-08 | Disposition: A | Discharge status: Home / Self Care | Source: Ambulatory Visit | Attending: Physician Assistant | Admitting: Physician Assistant

## 2023-11-08 DIAGNOSIS — Z87891 Personal history of nicotine dependence: Secondary | ICD-10-CM | POA: Diagnosis present

## 2023-11-08 DIAGNOSIS — Z122 Encounter for screening for malignant neoplasm of respiratory organs: Secondary | ICD-10-CM | POA: Insufficient documentation

## 2023-11-09 ENCOUNTER — Other Ambulatory Visit: Payer: Self-pay | Admitting: *Deleted

## 2023-11-09 DIAGNOSIS — D509 Iron deficiency anemia, unspecified: Secondary | ICD-10-CM

## 2023-11-10 ENCOUNTER — Inpatient Hospital Stay: Payer: Medicare PPO | Attending: Oncology

## 2023-11-10 DIAGNOSIS — D509 Iron deficiency anemia, unspecified: Secondary | ICD-10-CM | POA: Insufficient documentation

## 2023-11-10 DIAGNOSIS — Z87891 Personal history of nicotine dependence: Secondary | ICD-10-CM | POA: Diagnosis not present

## 2023-11-10 LAB — CBC WITH DIFFERENTIAL/PLATELET
Abs Immature Granulocytes: 0.02 10*3/uL (ref 0.00–0.07)
Basophils Absolute: 0 10*3/uL (ref 0.0–0.1)
Basophils Relative: 1 %
Eosinophils Absolute: 0.2 10*3/uL (ref 0.0–0.5)
Eosinophils Relative: 3 %
HCT: 27.3 % — ABNORMAL LOW (ref 39.0–52.0)
Hemoglobin: 7.8 g/dL — ABNORMAL LOW (ref 13.0–17.0)
Immature Granulocytes: 0 %
Lymphocytes Relative: 17 %
Lymphs Abs: 1 10*3/uL (ref 0.7–4.0)
MCH: 26.5 pg (ref 26.0–34.0)
MCHC: 28.6 g/dL — ABNORMAL LOW (ref 30.0–36.0)
MCV: 92.9 fL (ref 80.0–100.0)
Monocytes Absolute: 0.5 10*3/uL (ref 0.1–1.0)
Monocytes Relative: 9 %
Neutro Abs: 4 10*3/uL (ref 1.7–7.7)
Neutrophils Relative %: 70 %
Platelets: 257 10*3/uL (ref 150–400)
RBC: 2.94 MIL/uL — ABNORMAL LOW (ref 4.22–5.81)
RDW: 19 % — ABNORMAL HIGH (ref 11.5–15.5)
WBC: 5.7 10*3/uL (ref 4.0–10.5)
nRBC: 0 % (ref 0.0–0.2)

## 2023-11-10 LAB — IRON AND TIBC
Iron: 34 ug/dL — ABNORMAL LOW (ref 45–182)
Saturation Ratios: 8 % — ABNORMAL LOW (ref 17.9–39.5)
TIBC: 417 ug/dL (ref 250–450)
UIBC: 383 ug/dL

## 2023-11-10 LAB — FERRITIN: Ferritin: 14 ng/mL — ABNORMAL LOW (ref 24–336)

## 2023-11-13 ENCOUNTER — Inpatient Hospital Stay (HOSPITAL_BASED_OUTPATIENT_CLINIC_OR_DEPARTMENT_OTHER): Payer: Medicare PPO | Admitting: Oncology

## 2023-11-13 ENCOUNTER — Inpatient Hospital Stay: Payer: Medicare PPO

## 2023-11-13 VITALS — BP 137/57 | HR 47 | Temp 98.1°F | Resp 18 | Ht 70.0 in | Wt 265.0 lb

## 2023-11-13 VITALS — BP 131/47 | HR 45 | Resp 18

## 2023-11-13 DIAGNOSIS — D509 Iron deficiency anemia, unspecified: Secondary | ICD-10-CM

## 2023-11-13 MED ORDER — IRON SUCROSE 20 MG/ML IV SOLN
200.0000 mg | Freq: Once | INTRAVENOUS | Status: AC
  Administered 2023-11-13: 200 mg via INTRAVENOUS
  Filled 2023-11-13: qty 10

## 2023-11-13 NOTE — Progress Notes (Signed)
 [facility]  [phone] [phone]  ID: Charyl Dancer OB: December 25, 1955  MR#: [id number]  MWN#:[id number]  Patient Care Team: Gildardo Pounds, PA as PCP - General (Physician Assistant) Antonieta Iba, MD as PCP - Cardiology (Cardiology) Jeralyn Ruths, MD as Consulting Physician (Oncology)  CHIEF COMPLAINT: Iron deficiency anemia.  INTERVAL HISTORY: Patient returns to clinic today for repeat laboratory, further evaluation, consideration of additional IV Venofer.  He feels improved since receiving treatment several months ago.  He currently feels well and is asymptomatic.  He does not complain of any weakness or fatigue.  He has no neurologic complaints.  He denies any recent fevers or illnesses.  He has a good appetite and denies weight loss.  He has no chest pain, shortness of breath, cough, or hemoptysis.  He denies any nausea, vomiting, constipation, or diarrhea.  He has no urinary complaints.  Patient offers no specific complaints today.  REVIEW OF SYSTEMS:   Review of Systems  Constitutional: Negative.  Negative for fever, malaise/fatigue and weight loss.  Respiratory: Negative.  Negative for cough, hemoptysis and shortness of breath.   Cardiovascular: Negative.  Negative for chest pain and leg swelling.  Gastrointestinal: Negative.  Negative for abdominal pain, blood in stool and melena.  Genitourinary: Negative.  Negative for hematuria.  Musculoskeletal: Negative.  Negative for back pain.  Skin: Negative.   Neurological: Negative.  Negative for dizziness, focal weakness, weakness and headaches.  Psychiatric/Behavioral: Negative.  The patient is not nervous/anxious.     As per HPI. Otherwise, a complete review of systems is negative.  PAST MEDICAL HISTORY: Past Medical History:  Diagnosis Date   Alcoholic cirrhosis of liver (HCC)    Anemia 2024   Cataract 2024   CHF (congestive heart failure) (HCC)    Chronic gouty arthritis     CKD (chronic kidney disease), stage III (HCC)    Clotting disorder (HCC)    Bilateral legs-pt states plaque build up in both legs requiring intervention   Complication of anesthesia    pt states that years ago during colonoscopy at [facility] they had to abort the procedure due to some "cardiac issue" had to see cardiologist   COPD (chronic obstructive pulmonary disease) (HCC)    Hyperlipidemia    Hypertension    Hyperuricemia    Peripheral arterial disease (HCC)    Pre-diabetes    Prostate cancer (HCC) 2023   Sleep apnea    uses cpap    PAST SURGICAL HISTORY: Past Surgical History:  Procedure Laterality Date   ABDOMINAL AORTOGRAM W/LOWER EXTREMITY N/A 10/20/2021   Procedure: ABDOMINAL AORTOGRAM W/LOWER EXTREMITY;  Surgeon: Iran Ouch, MD;  Location: [facility];  Service: Cardiovascular;  Laterality: N/A;   ABDOMINAL AORTOGRAM W/LOWER EXTREMITY N/A 08/08/2023   Procedure: ABDOMINAL AORTOGRAM W/LOWER EXTREMITY;  Surgeon: Nada Libman, MD;  Location: [facility];  Service: Cardiovascular;  Laterality: N/A;   ALLOGRAFT APPLICATION Bilateral 12/22/2021   Procedure: ALLOGRAFT APPLICATION;  Surgeon: Nada Libman, MD;  Location: [facility] OR;  Service: Vascular;  Laterality: Bilateral;   ANGIOPLASTY Left 11/26/2021   Procedure: LEFT INFRARENAL LITHOTRIPSY;  Surgeon: Nada Libman, MD;  Location: [facility] OR;  Service: Vascular;  Laterality: Left;   APPLICATION OF WOUND VAC Bilateral 12/13/2021   Procedure: APPLICATION OF WOUND VAC;  Surgeon: Victorino Sparrow, MD;  Location: [facility] OR;  Service: Vascular;  Laterality: Bilateral;   CARDIAC CATHETERIZATION     [facility]   CATARACT EXTRACTION  W/PHACO Left 04/04/2023   Procedure: CATARACT EXTRACTION PHACO AND INTRAOCULAR LENS PLACEMENT (IOC) LEFT 8.71 00:45.5;  Surgeon: Galen Manila, MD;  Location: [facility];  Service: Ophthalmology;  Laterality: Left;   CATARACT EXTRACTION W/PHACO Right 04/18/2023   Procedure: CATARACT  EXTRACTION PHACO AND INTRAOCULAR LENS PLACEMENT (IOC) RIGHT 4.31 00:8.2;  Surgeon: Galen Manila, MD;  Location: [facility];  Service: Ophthalmology;  Laterality: Right;   COLONOSCOPY     COLONOSCOPY WITH PROPOFOL N/A 01/17/2018   Procedure: COLONOSCOPY WITH PROPOFOL;  Surgeon: Toledo, Boykin Nearing, MD;  Location: [facility] END[facility]COPY;  Service: Gastroenterology;  Laterality: N/A;   COLONOSCOPY WITH PROPOFOL N/A 09/12/2022   Procedure: COLONOSCOPY WITH PROPOFOL;  Surgeon: Regis Bill, MD;  Location: [facility] ENDOSCOPY;  Service: Endoscopy;  Laterality: N/A;   COLONOSCOPY WITH PROPOFOL N/A 09/05/2023   Procedure: COLONOSCOPY WITH PROPOFOL;  Surgeon: Regis Bill, MD;  Location: [facility];  Service: Endoscopy;  Laterality: N/A;   ENDARTERECTOMY FEMORAL Bilateral 11/26/2021   Procedure: BILATERAL FEMORAL ENDARTERECTOMY WITH VEIN PATCH ANGIOPLASTY;  Surgeon: Nada Libman, MD;  Location: [facility] OR;  Service: Vascular;  Laterality: Bilateral;   ENTEROSCOPY N/A 09/05/2023   Procedure: ENTEROSCOPY;  Surgeon: Regis Bill, MD;  Location: [facility];  Service: Endoscopy;  Laterality: N/A;   ESOPHAGOGASTRODUODENOSCOPY (EGD) WITH PROPOFOL N/A 01/17/2018   Procedure: ESOPHAGOGASTRODUODENOSCOPY (EGD) WITH PROPOFOL;  Surgeon: Toledo, Boykin Nearing, MD;  Location: [facility];  Service: Gastroenterology;  Laterality: N/A;   ESOPHAGOGASTRODUODENOSCOPY (EGD) WITH PROPOFOL N/A 09/12/2022   Procedure: ESOPHAGOGASTRODUODENOSCOPY (EGD) WITH PROPOFOL;  Surgeon: Regis Bill, MD;  Location: [facility];  Service: Endoscopy;  Laterality: N/A;   EYE SURGERY     GROIN DEBRIDEMENT Bilateral 12/16/2021   Procedure: IRRIGATION AND DEBRIDEMENT OF BILATERAL GROINS;  Surgeon: Nada Libman, MD;  Location: [facility] OR;  Service: Vascular;  Laterality: Bilateral;   INCISION AND DRAINAGE OF WOUND Bilateral 12/13/2021   Procedure: IRRIGATION AND DEBRIDEMENT OF BILATERAL GROIN WOUNDS;  Surgeon:  Victorino Sparrow, MD;  Location: [facility] OR;  Service: Vascular;  Laterality: Bilateral;   INSERTION OF ILIAC STENT Bilateral 11/26/2021   Procedure: BILATERAL ILIAC STENTING USING 3mmX80mm INNOVA STENT ON LEFT ILIAC AND 83mmX59mm, 38mmX79mm VBX AND 8mmX7.5cm VIABAHN STENT ON RIGHT ILIAC;  Surgeon: Nada Libman, MD;  Location: [facility] OR;  Service: Vascular;  Laterality: Bilateral;   INSERTION OF SEEDS IN PROSTATE     ORIF ANKLE FRACTURE Right    PERIPHERAL INTRAVASCULAR LITHOTRIPSY  08/08/2023   Procedure: PERIPHERAL INTRAVASCULAR LITHOTRIPSY;  Surgeon: Nada Libman, MD;  [facility]: [facility];  Service: Cardiovascular;;   PROSTATE BIOPSY N/A 04/01/2021   Procedure: PROSTATE BIOPSY Addison Bailey;  Surgeon: Orson Ape, MD;  Location: [facility] ORS;  Service: Urology;  Laterality: N/A;   VEIN HARVEST Bilateral 11/26/2021   Procedure: VEIN HARVEST OF BILATERAL SPAHENOUS VEINS;  Surgeon: Nada Libman, MD;  Location: [facility];  Service: Vascular;  Laterality: Bilateral;   WOUND DEBRIDEMENT Bilateral 12/22/2021   Procedure: INCISION AND DEBRIDEMENT OF BILATERAL GROINS;  Surgeon: Nada Libman, MD;  Location: [facility] OR;  Service: Vascular;  Laterality: Bilateral;    FAMILY HISTORY: Family History  Problem Relation Age of Onset   Heart disease Mother    Heart disease Brother     ADVANCED DIRECTIVES (Y/N):  N  HEALTH MAINTENANCE: Social History   Tobacco Use   Smoking status: Former    Current packs/day: 0.00    Average packs/day: 1 pack/day for 47.0 years (  47.0 ttl pk-yrs)    Types: Cigarettes    Start date: 70    Quit date: 2016    Years since quitting: 9.2    Passive exposure: Never   Smokeless tobacco: Never  Vaping Use   Vaping status: Never Used  Substance Use Topics   Alcohol use: Not Currently   Drug use: No     Colonoscopy:  PAP:  Bone density:  Lipid panel:  Allergies  Allergen Reactions   Jardiance [Empagliflozin]     Loss of balance, hypoglycemic     Current  Outpatient Medications  Medication Sig Dispense Refill   albuterol (VENTOLIN HFA) 108 (90 Base) MCG/ACT inhaler Inhale 2 puffs into the lungs every 6 (six) hours as needed for wheezing or shortness of breath.     allopurinol (ZYLOPRIM) 100 MG tablet Take 100 mg by mouth daily.     amiodarone (PACERONE) 200 MG tablet Take 200 mg by mouth 2 (two) times daily.     aspirin EC 81 MG tablet Take 81 mg by mouth every morning.     atenolol (TENORMIN) 25 MG tablet Take 25 mg by mouth daily.     chlorthalidone (HYGROTON) 25 MG tablet Take 25 mg by mouth daily.     Cholecalciferol (VITAMIN D3) 25 MCG (1000 UT) CAPS Take 2,000 Units by mouth daily.     clopidogrel (PLAVIX) 75 MG tablet Take 1 tablet (75 mg total) by mouth daily. 30 tablet 11   cyanocobalamin (VITAMIN B12) 500 MCG tablet Take 500 mcg by mouth daily.     ezetimibe (ZETIA) 10 MG tablet Take 1 tablet (10 mg total) by mouth daily. 90 tablet 3   lisinopril (ZESTRIL) 40 MG tablet Take 40 mg by mouth daily.     simvastatin (ZOCOR) 20 MG tablet Take 20 mg by mouth in the morning.     triamcinolone ointment (KENALOG) 0.1 % Apply 1 Application topically 2 (two) times daily as needed (psoriasis).     zolpidem (AMBIEN) 10 MG tablet Take 10 mg by mouth at bedtime.     iron polysaccharides (NIFEREX) 150 MG capsule Take 1 capsule (150 mg total) by mouth daily. 90 capsule 0   No current facility-administered medications for this visit.    OBJECTIVE: Vitals:   11/13/23 1302  BP: (!) 137/57  Pulse: (!) 47  Resp: 18  Temp: 98.1 F (36.7 C)  SpO2: 98%     Body mass index is 38.02 kg/m.    ECOG FS:0 - Asymptomatic  General: Well-developed, well-nourished, no acute distress. Eyes: Pink conjunctiva, anicteric sclera. HEENT: Normocephalic, moist mucous membranes. Lungs: No audible wheezing or coughing. Heart: Regular rate and rhythm. Abdomen: Soft, nontender, no obvious distention. Musculoskeletal: No edema, cyanosis, or clubbing. Neuro: Alert,  answering all questions appropriately. Cranial nerves grossly intact. Skin: No rashes or petechiae noted. Psych: Normal affect.  LAB RESULTS:  Lab Results  Component Value Date   NA 145 08/08/2023   K 4.7 08/08/2023   CL 115 (H) 08/08/2023   CO2 19 (L) 08/04/2023   GLUCOSE 89 08/08/2023   BUN 39 (H) 08/08/2023   CREATININE 2.80 (H) 08/08/2023   CALCIUM 8.5 (L) 08/04/2023   PROT 7.0 09/09/2022   ALBUMIN 3.8 09/09/2022   AST 17 09/09/2022   ALT 14 09/09/2022   ALKPHOS 46 09/09/2022   BILITOT 0.5 09/09/2022   GFRNONAA 28 (L) 08/04/2023   GFRAA 56 (L) 10/26/2019    Lab Results  Component Value Date   WBC 5.7 11/10/2023  NEUTROABS 4.0 11/10/2023   HGB 7.8 (L) 11/10/2023   HCT 27.3 (L) 11/10/2023   MCV 92.9 11/10/2023   PLT 257 11/10/2023   Lab Results  Component Value Date   IRON 34 (L) 11/10/2023   TIBC 417 11/10/2023   IRONPCTSAT 8 (L) 11/10/2023   Lab Results  Component Value Date   FERRITIN 14 (L) 11/10/2023     STUDIES: No results found.  ASSESSMENT: Iron deficiency anemia.  PLAN:    Iron deficiency anemia: His most recent colonoscopy and EGD in January 2025 revealed nonbleeding superficial duodenal ulcer and angiectasia in the duodenum and the gastric body, all treated with argon plasma beam.  No other pathology was noted.  Patient's hemoglobin and iron stores remain reduced therefore we will proceed with 200 mg IV Venofer today.  Return to clinic 4 times over the next 1 to 2 weeks to receive additional treatments.  Patient will then return to clinic in 4 months with repeat laboratory work, further evaluation, and continuation of treatment if needed.    I spent a total of 30 minutes reviewing chart data, face-to-face evaluation with the patient, counseling and coordination of care as detailed above.   Patient expressed understanding and was in agreement with this plan. He also understands that He can call clinic at any time with any questions, concerns, or  complaints.    Jeralyn Ruths, MD   11/13/2023 1:16 PM

## 2023-11-13 NOTE — Patient Instructions (Signed)

## 2023-11-16 ENCOUNTER — Inpatient Hospital Stay

## 2023-11-16 VITALS — BP 120/47 | HR 58 | Temp 97.5°F | Resp 17

## 2023-11-16 DIAGNOSIS — D509 Iron deficiency anemia, unspecified: Secondary | ICD-10-CM | POA: Diagnosis not present

## 2023-11-16 MED ORDER — IRON SUCROSE 20 MG/ML IV SOLN
200.0000 mg | Freq: Once | INTRAVENOUS | Status: AC
  Administered 2023-11-16: 200 mg via INTRAVENOUS

## 2023-11-20 ENCOUNTER — Inpatient Hospital Stay

## 2023-11-20 VITALS — BP 121/59 | HR 41 | Temp 98.2°F | Resp 18

## 2023-11-20 DIAGNOSIS — D509 Iron deficiency anemia, unspecified: Secondary | ICD-10-CM

## 2023-11-20 MED ORDER — IRON SUCROSE 20 MG/ML IV SOLN
200.0000 mg | Freq: Once | INTRAVENOUS | Status: AC
  Administered 2023-11-20: 200 mg via INTRAVENOUS

## 2023-11-20 MED ORDER — SODIUM CHLORIDE 0.9% FLUSH
10.0000 mL | Freq: Once | INTRAVENOUS | Status: AC | PRN
  Administered 2023-11-20: 10 mL
  Filled 2023-11-20: qty 10

## 2023-11-24 ENCOUNTER — Inpatient Hospital Stay

## 2023-11-24 VITALS — BP 130/54 | HR 49 | Temp 98.0°F | Resp 17

## 2023-11-24 DIAGNOSIS — D509 Iron deficiency anemia, unspecified: Secondary | ICD-10-CM | POA: Diagnosis not present

## 2023-11-24 MED ORDER — IRON SUCROSE 20 MG/ML IV SOLN
200.0000 mg | Freq: Once | INTRAVENOUS | Status: AC
  Administered 2023-11-24: 200 mg via INTRAVENOUS

## 2023-11-27 ENCOUNTER — Inpatient Hospital Stay

## 2023-11-27 VITALS — BP 119/51 | HR 62 | Temp 97.2°F | Resp 18

## 2023-11-27 DIAGNOSIS — D509 Iron deficiency anemia, unspecified: Secondary | ICD-10-CM | POA: Diagnosis not present

## 2023-11-27 MED ORDER — IRON SUCROSE 20 MG/ML IV SOLN
200.0000 mg | Freq: Once | INTRAVENOUS | Status: AC
Start: 2023-11-27 — End: 2023-11-27
  Administered 2023-11-27: 200 mg via INTRAVENOUS

## 2023-11-27 NOTE — Patient Instructions (Signed)

## 2023-12-04 ENCOUNTER — Encounter (HOSPITAL_COMMUNITY): Payer: Medicare PPO

## 2023-12-04 ENCOUNTER — Ambulatory Visit: Payer: Medicare PPO

## 2023-12-05 ENCOUNTER — Telehealth: Payer: Self-pay

## 2023-12-05 NOTE — Telephone Encounter (Signed)
 Call Report From Tiffany:   IMPRESSION: 1. Lung-RADS 2-S, benign appearance or behavior. Continue annual screening with low-dose chest CT without contrast in 12 months. 2. The "S" modifier above refers to potentially clinically significant non lung cancer related findings. Specifically, indeterminate soft tissue density exophytic 2.7 cm lateral upper left renal cortical lesion, increased from 2.1 cm on 10/27/2022 CT, cannot exclude renal neoplasm. Recommend further evaluation with renal protocol abdominal MRI without and with IV contrast. 3. Cirrhosis. 4. Three-vessel coronary atherosclerosis. 5. Dilated 4.2 cm ascending thoracic aorta, which can be reassessed on follow-up screening chest CT in 12 months. 6. Aortic Atherosclerosis (ICD10-I70.0) and Emphysema (ICD10-J43.9).

## 2023-12-08 ENCOUNTER — Telehealth: Payer: Self-pay | Admitting: Acute Care

## 2023-12-08 NOTE — Telephone Encounter (Signed)
 I have attempted to call the patient with the results of their  Low Dose CT Chest Lung cancer screening scan. There was no answer. I have left a HIPPA compliant VM requesting the patient call the office for the scan results. I included the office contact information in the message. We will await his return call. If no return call we will continue to call until patient is contacted.   He will need MRI of the kidney by his PCP. Please fax a copy of the scan and let them know from a lung cancer perspective, 12 moth follow up.   I have sent fax and called the PCP office. Thanks

## 2023-12-11 ENCOUNTER — Other Ambulatory Visit: Payer: Self-pay

## 2023-12-11 DIAGNOSIS — Z122 Encounter for screening for malignant neoplasm of respiratory organs: Secondary | ICD-10-CM

## 2023-12-11 DIAGNOSIS — Z87891 Personal history of nicotine dependence: Secondary | ICD-10-CM

## 2023-12-11 NOTE — Telephone Encounter (Signed)
 Attempted to reach patient. LVM to call office and review results.

## 2023-12-11 NOTE — Telephone Encounter (Signed)
 Spoke with patient and reviewed results below. Advises he will complete CT scan again next year to screen for lung cancer. Advises his PCP has already contacted him to schedule an MRI to evaluate the kidney lesion. He is awaiting radiology to call him. Reviewed cirrhosis, three-vessel coronary atherosclerosis, dilated 4.2 cm ascending thoracic aorta, Aortic Atherosclerosis and Emphysema. He is on a statin. Results will be sent to PCP, Nephrology and Vascular. Annual CT order placed.   IMPRESSION: 1. Lung-RADS 2-S, benign appearance or behavior. Continue annual screening with low-dose chest CT without contrast in 12 months. 2. The "S" modifier above refers to potentially clinically significant non lung cancer related findings. Specifically, indeterminate soft tissue density exophytic 2.7 cm lateral upper left renal cortical lesion, increased from 2.1 cm on 10/27/2022 CT, cannot exclude renal neoplasm. Recommend further evaluation with renal protocol abdominal MRI without and with IV contrast. 3. Cirrhosis. 4. Three-vessel coronary atherosclerosis. 5. Dilated 4.2 cm ascending thoracic aorta, which can be reassessed on follow-up screening chest CT in 12 months. 6. Aortic Atherosclerosis (ICD10-I70.0) and Emphysema (ICD10-J43.9).

## 2023-12-11 NOTE — Telephone Encounter (Signed)
 See phone note from 12/08/23. Will close encounter.

## 2024-03-04 ENCOUNTER — Ambulatory Visit (HOSPITAL_COMMUNITY): Admission: RE | Admit: 2024-03-04 | Payer: Medicare PPO | Source: Ambulatory Visit

## 2024-03-04 ENCOUNTER — Encounter (HOSPITAL_COMMUNITY): Payer: Self-pay

## 2024-03-04 ENCOUNTER — Ambulatory Visit: Payer: Medicare PPO

## 2024-03-04 ENCOUNTER — Ambulatory Visit (HOSPITAL_COMMUNITY): Payer: Medicare PPO | Attending: Surgery

## 2024-03-04 ENCOUNTER — Telehealth: Payer: Self-pay

## 2024-03-04 NOTE — Progress Notes (Deleted)
 HISTORY AND PHYSICAL     CC:  follow up. Requesting Provider:  Autry Grayce LABOR, PA  HPI: This is a 68 y.o. male who is here today for follow up for PAD.    Vascular hx: -bilateral iliofemoral endarterectomy; bilateral CIA/EIA and right CFA stenting with intra arterial lithotripsy bilateral CIA/EIA on 11/26/2021 by Dr. Serene -bilateral groin wound washout and debridement 12/14/2021 Dr. Lanis and 12/16/2021 Dr. Serene -angiogram with intra-arterial lithotripsy & stent left SFA 08/08/2023 Dr. Serene  Pt was last seen 09/18/2023 and at that time, pt was doing well without rest pain or tissue loss and left leg claudication was resolved. He was encouraged to continue walking program.    The pt returns today for follow up.  ***  The pt is on a statin for cholesterol management.    The pt is on an aspirin .    Other AC:  Plavix  The pt is on BB, ACEI for hypertension.  The pt is not on diabetic medication. Tobacco hx:  former  Pt does not have family hx of AAA.  Past Medical History:  Diagnosis Date   Alcoholic cirrhosis of liver (HCC)    Anemia 2024   Cataract 2024   CHF (congestive heart failure) (HCC)    Chronic gouty arthritis    CKD (chronic kidney disease), stage III (HCC)    Clotting disorder (HCC)    Bilateral legs-pt states plaque build up in both legs requiring intervention   Complication of anesthesia    pt states that years ago during colonoscopy at [facility] they had to abort the procedure due to some cardiac issue had to see cardiologist   COPD (chronic obstructive pulmonary disease) (HCC)    Hyperlipidemia    Hypertension    Hyperuricemia    Peripheral arterial disease (HCC)    Pre-diabetes    Prostate cancer (HCC) 2023   Sleep apnea    uses cpap    Past Surgical History:  Procedure Laterality Date   ABDOMINAL AORTOGRAM W/LOWER EXTREMITY N/A 10/20/2021   Procedure: ABDOMINAL AORTOGRAM W/LOWER EXTREMITY;  Surgeon: Darron Deatrice LABOR, MD;  Location: [facility];  Service: Cardiovascular;  Laterality: N/A;   ABDOMINAL AORTOGRAM W/LOWER EXTREMITY N/A 08/08/2023   Procedure: ABDOMINAL AORTOGRAM W/LOWER EXTREMITY;  Surgeon: Serene Gaile ORN, MD;  Location: [facility];  Service: Cardiovascular;  Laterality: N/A;   ALLOGRAFT APPLICATION Bilateral 12/22/2021   Procedure: ALLOGRAFT APPLICATION;  Surgeon: Serene Gaile ORN, MD;  Location: [facility] OR;  Service: Vascular;  Laterality: Bilateral;   ANGIOPLASTY Left 11/26/2021   Procedure: LEFT INFRARENAL LITHOTRIPSY;  Surgeon: Serene Gaile ORN, MD;  Location: [facility] OR;  Service: Vascular;  Laterality: Left;   APPLICATION OF WOUND VAC Bilateral 12/13/2021   Procedure: APPLICATION OF WOUND VAC;  Surgeon: Lanis Fonda BRAVO, MD;  Location: [facility] OR;  Service: Vascular;  Laterality: Bilateral;   CARDIAC CATHETERIZATION     [facility]   CATARACT EXTRACTION W/PHACO Left 04/04/2023   Procedure: CATARACT EXTRACTION PHACO AND INTRAOCULAR LENS PLACEMENT (IOC) LEFT 8.71 00:45.5;  Surgeon: Jaye Fallow, MD;  Location: [facility];  Service: Ophthalmology;  Laterality: Left;   CATARACT EXTRACTION W/PHACO Right 04/18/2023   Procedure: CATARACT EXTRACTION PHACO AND INTRAOCULAR LENS PLACEMENT (IOC) RIGHT 4.31 00:8.2;  Surgeon: Jaye Fallow, MD;  Location: [facility];  Service: Ophthalmology;  Laterality: Right;   COLONOSCOPY     COLONOSCOPY WITH PROPOFOL  N/A 01/17/2018   Procedure: COLONOSCOPY WITH PROPOFOL ;  Surgeon: Aundria, Teodoro K, MD;  Location:  [facility]COPY;  Service: Gastroenterology;  Laterality: N/A;   COLONOSCOPY WITH PROPOFOL  N/A 09/12/2022   Procedure: COLONOSCOPY WITH PROPOFOL ;  Surgeon: Maryruth Ole DASEN, MD;  Location: [facility] ENDOSCOPY;  Service: Endoscopy;  Laterality: N/A;   COLONOSCOPY WITH PROPOFOL  N/A 09/05/2023   Procedure: COLONOSCOPY WITH PROPOFOL ;  Surgeon: Maryruth Ole DASEN, MD;  Location: [facility]COPY;  Service: Endoscopy;  Laterality: N/A;   ENDARTERECTOMY FEMORAL Bilateral  11/26/2021   Procedure: BILATERAL FEMORAL ENDARTERECTOMY WITH VEIN PATCH ANGIOPLASTY;  Surgeon: Serene Gaile ORN, MD;  Location: [facility];  Service: Vascular;  Laterality: Bilateral;   ENTEROSCOPY N/A 09/05/2023   Procedure: ENTEROSCOPY;  Surgeon: Maryruth Ole DASEN, MD;  Location: [facility];  Service: Endoscopy;  Laterality: N/A;   ESOPHAGOGASTRODUODENOSCOPY (EGD) WITH PROPOFOL  N/A 01/17/2018   Procedure: ESOPHAGOGASTRODUODENOSCOPY (EGD) WITH PROPOFOL ;  Surgeon: Toledo, Ladell POUR, MD;  Location: [facility]COPY;  Service: Gastroenterology;  Laterality: N/A;   ESOPHAGOGASTRODUODENOSCOPY (EGD) WITH PROPOFOL  N/A 09/12/2022   Procedure: ESOPHAGOGASTRODUODENOSCOPY (EGD) WITH PROPOFOL ;  Surgeon: Maryruth Ole DASEN, MD;  Location: [facility];  Service: Endoscopy;  Laterality: N/A;   EYE SURGERY     GROIN DEBRIDEMENT Bilateral 12/16/2021   Procedure: IRRIGATION AND DEBRIDEMENT OF BILATERAL GROINS;  Surgeon: Serene Gaile ORN, MD;  Location: [facility];  Service: Vascular;  Laterality: Bilateral;   INCISION AND DRAINAGE OF WOUND Bilateral 12/13/2021   Procedure: IRRIGATION AND DEBRIDEMENT OF BILATERAL GROIN WOUNDS;  Surgeon: Lanis Fonda BRAVO, MD;  Location: [facility] OR;  Service: Vascular;  Laterality: Bilateral;   INSERTION OF ILIAC STENT Bilateral 11/26/2021   Procedure: BILATERAL ILIAC STENTING USING 53mmX80mm INNOVA STENT ON LEFT ILIAC AND 29mmX59mm, 91mmX79mm VBX AND 54mmX7.5cm VIABAHN STENT ON RIGHT ILIAC;  Surgeon: Serene Gaile ORN, MD;  Location: [facility];  Service: Vascular;  Laterality: Bilateral;   INSERTION OF SEEDS IN PROSTATE     ORIF ANKLE FRACTURE Right    PERIPHERAL INTRAVASCULAR LITHOTRIPSY  08/08/2023   Procedure: PERIPHERAL INTRAVASCULAR LITHOTRIPSY;  Surgeon: Serene Gaile ORN, MD;  [facility]: [facility];  Service: Cardiovascular;;   PROSTATE BIOPSY N/A 04/01/2021   Procedure: PROSTATE BIOPSY GRAYCE;  Surgeon: Kassie Ozell SAUNDERS, MD;  Location: [facility] ORS;  Service: Urology;  Laterality: N/A;    VEIN HARVEST Bilateral 11/26/2021   Procedure: VEIN HARVEST OF BILATERAL SPAHENOUS VEINS;  Surgeon: Serene Gaile ORN, MD;  Location: [facility];  Service: Vascular;  Laterality: Bilateral;   WOUND DEBRIDEMENT Bilateral 12/22/2021   Procedure: INCISION AND DEBRIDEMENT OF BILATERAL GROINS;  Surgeon: Serene Gaile ORN, MD;  Location: [facility];  Service: Vascular;  Laterality: Bilateral;    Allergies  Allergen Reactions   Jardiance [Empagliflozin]     Loss of balance, hypoglycemic     Current Outpatient Medications  Medication Sig Dispense Refill   albuterol  (VENTOLIN  HFA) 108 (90 Base) MCG/ACT inhaler Inhale 2 puffs into the lungs every 6 (six) hours as needed for wheezing or shortness of breath.     allopurinol  (ZYLOPRIM ) 100 MG tablet Take 100 mg by mouth daily.     amiodarone  (PACERONE ) 200 MG tablet Take 200 mg by mouth 2 (two) times daily.     aspirin  EC 81 MG tablet Take 81 mg by mouth every morning.     atenolol  (TENORMIN ) 25 MG tablet Take 25 mg by mouth daily.     chlorthalidone  (HYGROTON ) 25 MG tablet Take 25 mg by mouth daily.     Cholecalciferol  (VITAMIN D3) 25 MCG (1000 UT) CAPS Take 2,000 Units by mouth daily.  clopidogrel  (PLAVIX ) 75 MG tablet Take 1 tablet (75 mg total) by mouth daily. 30 tablet 11   cyanocobalamin  (VITAMIN B12) 500 MCG tablet Take 500 mcg by mouth daily.     ezetimibe  (ZETIA ) 10 MG tablet Take 1 tablet (10 mg total) by mouth daily. 90 tablet 3   iron  polysaccharides (NIFEREX) 150 MG capsule Take 1 capsule (150 mg total) by mouth daily. 90 capsule 0   lisinopril  (ZESTRIL ) 40 MG tablet Take 40 mg by mouth daily.     simvastatin  (ZOCOR ) 20 MG tablet Take 20 mg by mouth in the morning.     triamcinolone  ointment (KENALOG ) 0.1 % Apply 1 Application topically 2 (two) times daily as needed (psoriasis).     zolpidem  (AMBIEN ) 10 MG tablet Take 10 mg by mouth at bedtime.     No current facility-administered medications for this visit.    Family History  Problem  Relation Age of Onset   Heart disease Mother    Heart disease Brother     Social History   Socioeconomic History   Marital status: Married    Spouse name: Adelle   Number of children: 2   Years of education: Not on file   Highest education level: Not on file  Occupational History   Occupation: disabled  Tobacco Use   Smoking status: Former    Current packs/day: 0.00    Average packs/day: 1 pack/day for 47.0 years (47.0 ttl pk-yrs)    Types: Cigarettes    Start date: 56    Quit date: 2016    Years since quitting: 9.5    Passive exposure: Never   Smokeless tobacco: Never  Vaping Use   Vaping status: Never Used  Substance and Sexual Activity   Alcohol use: Not Currently   Drug use: No   Sexual activity: Not Currently    Birth control/protection: Other-see comments    Comment: ED  Other Topics Concern   Not on file  Social History Narrative   Not on file   Social Drivers of Health   Financial Resource Strain: Low Risk  (07/17/2023)   Overall Financial Resource Strain (CARDIA)    Difficulty of Paying Living Expenses: Not hard at all  Food Insecurity: No Food Insecurity (07/17/2023)   Hunger Vital Sign    Worried About Running Out of Food in the Last Year: Never true    Ran Out of Food in the Last Year: Never true  Transportation Needs: No Transportation Needs (07/17/2023)   PRAPARE - Administrator, Civil Service (Medical): No    Lack of Transportation (Non-Medical): No  Physical Activity: Not on file  Stress: Not on file  Social Connections: Not on file  Intimate Partner Violence: Not At Risk (07/17/2023)   Humiliation, Afraid, Rape, and Kick questionnaire    Fear of Current or Ex-Partner: No    Emotionally Abused: No    Physically Abused: No    Sexually Abused: No     REVIEW OF SYSTEMS:  *** [X]  denotes positive finding, [ ]  denotes negative finding Cardiac  Comments:  Chest pain or chest pressure:    Shortness of breath upon exertion:     Short of breath when lying flat:    Irregular heart rhythm:        Vascular    Pain in calf, thigh, or hip brought on by ambulation:    Pain in feet at night that wakes you up from your sleep:     Blood clot  in your veins:    Leg swelling:         Pulmonary    Oxygen at home:    Productive cough:     Wheezing:         Neurologic    Sudden weakness in arms or legs:     Sudden numbness in arms or legs:     Sudden onset of difficulty speaking or slurred speech:    Temporary loss of vision in one eye:     Problems with dizziness:         Gastrointestinal    Blood in stool:     Vomited blood:         Genitourinary    Burning when urinating:     Blood in urine:        Psychiatric    Major depression:         Hematologic    Bleeding problems:    Problems with blood clotting too easily:        Skin    Rashes or ulcers:        Constitutional    Fever or chills:      PHYSICAL EXAMINATION:  ***  General:  WDWN in NAD; vital signs documented above Gait: Not observed HENT: WNL, normocephalic Pulmonary: normal non-labored breathing , without wheezing Cardiac: {Desc; regular/irreg:14544} HR, {With/Without:20273} carotid bruit*** Abdomen: soft, NT; aortic pulse is *** palpable Skin: {With/Without:20273} rashes Vascular Exam/Pulses:  Right Left  Radial {Exam; arterial pulse strength 0-4:30167} {Exam; arterial pulse strength 0-4:30167}  Femoral {Exam; arterial pulse strength 0-4:30167} {Exam; arterial pulse strength 0-4:30167}  Popliteal {Exam; arterial pulse strength 0-4:30167} {Exam; arterial pulse strength 0-4:30167}  DP {Exam; arterial pulse strength 0-4:30167} {Exam; arterial pulse strength 0-4:30167}  PT {Exam; arterial pulse strength 0-4:30167} {Exam; arterial pulse strength 0-4:30167}  Peroneal *** ***   Extremities: {With/Without:20273} ischemic changes, {With/Without:20273} Gangrene , {With/Without:20273} cellulitis; {With/Without:20273} open  wounds Musculoskeletal: no muscle wasting or atrophy  Neurologic: A&O X 3 Psychiatric:  The pt has {Desc; normal/abnormal:11317::Normal} affect.   Non-Invasive Vascular Imaging:   ABI's/TBI's on 03/04/2024: Right:  *** - Great toe pressure: *** Left:  *** - Great toe pressure: ***  Arterial duplex on 03/04/2024: ***  Previous ABI's/TBI's on 09/18/2023: Right:  1.14/1.58 - Great toe pressure: 223 Left:  1.10/0.72 - Great toe pressure:  102  Previous arterial duplex on 09/18/2023: Left: Patent left lower extremity with no visualized stenosis     ASSESSMENT/PLAN:: 68 y.o. male here for follow up for PAD with hx of: -bilateral iliofemoral endarterectomy; bilateral CIA/EIA and right CFA stenting with intra arterial lithotripsy bilateral CIA/EIA on 11/26/2021 by Dr. Serene -bilateral groin wound washout and debridement 12/14/2021 Dr. Lanis and 12/16/2021 Dr. Serene -angiogram with intra-arterial lithotripsy & stent left SFA 08/08/2023 Dr. Serene   -*** -continue asa/statin/plavix  -discussed importance of increased walking daily -pt will f/u in *** with ***.   Lucie Apt, [facility] [phone]  Clinic MD:   Serene

## 2024-03-04 NOTE — Telephone Encounter (Signed)
 Noticed pt. Did e-check in for PA visit but was no showed for US  visit. Made x2 attempts to call w/o success. Unable to leave voicemail due to vm not being set up to reschedule all visits. Will cancel PA visit at this time since US  was not completed. MP,PAA

## 2024-03-08 ENCOUNTER — Inpatient Hospital Stay
Admission: EM | Admit: 2024-03-08 | Discharge: 2024-03-11 | DRG: 811 | Disposition: A | Discharge status: Home / Self Care | Attending: Internal Medicine | Admitting: Internal Medicine

## 2024-03-08 ENCOUNTER — Other Ambulatory Visit: Payer: Self-pay

## 2024-03-08 ENCOUNTER — Emergency Department

## 2024-03-08 DIAGNOSIS — E785 Hyperlipidemia, unspecified: Secondary | ICD-10-CM | POA: Diagnosis not present

## 2024-03-08 DIAGNOSIS — I2510 Atherosclerotic heart disease of native coronary artery without angina pectoris: Secondary | ICD-10-CM | POA: Diagnosis present

## 2024-03-08 DIAGNOSIS — Z87891 Personal history of nicotine dependence: Secondary | ICD-10-CM

## 2024-03-08 DIAGNOSIS — Z7902 Long term (current) use of antithrombotics/antiplatelets: Secondary | ICD-10-CM

## 2024-03-08 DIAGNOSIS — J449 Chronic obstructive pulmonary disease, unspecified: Secondary | ICD-10-CM | POA: Diagnosis present

## 2024-03-08 DIAGNOSIS — Z79899 Other long term (current) drug therapy: Secondary | ICD-10-CM

## 2024-03-08 DIAGNOSIS — Z888 Allergy status to other drugs, medicaments and biological substances status: Secondary | ICD-10-CM | POA: Diagnosis not present

## 2024-03-08 DIAGNOSIS — I480 Paroxysmal atrial fibrillation: Secondary | ICD-10-CM | POA: Diagnosis not present

## 2024-03-08 DIAGNOSIS — E669 Obesity, unspecified: Secondary | ICD-10-CM | POA: Diagnosis not present

## 2024-03-08 DIAGNOSIS — K7030 Alcoholic cirrhosis of liver without ascites: Secondary | ICD-10-CM | POA: Diagnosis not present

## 2024-03-08 DIAGNOSIS — Z6838 Body mass index (BMI) 38.0-38.9, adult: Secondary | ICD-10-CM

## 2024-03-08 DIAGNOSIS — D649 Anemia, unspecified: Principal | ICD-10-CM

## 2024-03-08 DIAGNOSIS — I10 Essential (primary) hypertension: Secondary | ICD-10-CM | POA: Diagnosis present

## 2024-03-08 DIAGNOSIS — I5033 Acute on chronic diastolic (congestive) heart failure: Secondary | ICD-10-CM | POA: Diagnosis present

## 2024-03-08 DIAGNOSIS — Z7982 Long term (current) use of aspirin: Secondary | ICD-10-CM

## 2024-03-08 DIAGNOSIS — D509 Iron deficiency anemia, unspecified: Secondary | ICD-10-CM | POA: Diagnosis present

## 2024-03-08 DIAGNOSIS — Z8249 Family history of ischemic heart disease and other diseases of the circulatory system: Secondary | ICD-10-CM | POA: Diagnosis not present

## 2024-03-08 DIAGNOSIS — I130 Hypertensive heart and chronic kidney disease with heart failure and stage 1 through stage 4 chronic kidney disease, or unspecified chronic kidney disease: Secondary | ICD-10-CM | POA: Diagnosis not present

## 2024-03-08 DIAGNOSIS — N183 Chronic kidney disease, stage 3 unspecified: Secondary | ICD-10-CM | POA: Diagnosis present

## 2024-03-08 DIAGNOSIS — D62 Acute posthemorrhagic anemia: Secondary | ICD-10-CM | POA: Diagnosis not present

## 2024-03-08 DIAGNOSIS — I739 Peripheral vascular disease, unspecified: Secondary | ICD-10-CM | POA: Diagnosis not present

## 2024-03-08 DIAGNOSIS — M1A9XX0 Chronic gout, unspecified, without tophus (tophi): Secondary | ICD-10-CM | POA: Diagnosis not present

## 2024-03-08 DIAGNOSIS — R7303 Prediabetes: Secondary | ICD-10-CM | POA: Diagnosis not present

## 2024-03-08 DIAGNOSIS — I509 Heart failure, unspecified: Secondary | ICD-10-CM

## 2024-03-08 DIAGNOSIS — Z8546 Personal history of malignant neoplasm of prostate: Secondary | ICD-10-CM

## 2024-03-08 LAB — URINALYSIS, COMPLETE (UACMP) WITH MICROSCOPIC
Bacteria, UA: NONE SEEN
Bilirubin Urine: NEGATIVE
Glucose, UA: NEGATIVE mg/dL
Hgb urine dipstick: NEGATIVE
Ketones, ur: NEGATIVE mg/dL
Leukocytes,Ua: NEGATIVE
Nitrite: NEGATIVE
Protein, ur: NEGATIVE mg/dL
Specific Gravity, Urine: 1.01 (ref 1.005–1.030)
pH: 5 (ref 5.0–8.0)

## 2024-03-08 LAB — CBC
HCT: 19.6 % — ABNORMAL LOW (ref 39.0–52.0)
Hemoglobin: 5.2 g/dL — ABNORMAL LOW (ref 13.0–17.0)
MCH: 21.3 pg — ABNORMAL LOW (ref 26.0–34.0)
MCHC: 26.5 g/dL — ABNORMAL LOW (ref 30.0–36.0)
MCV: 80.3 fL (ref 80.0–100.0)
Platelets: 233 K/uL (ref 150–400)
RBC: 2.44 MIL/uL — ABNORMAL LOW (ref 4.22–5.81)
RDW: 21.1 % — ABNORMAL HIGH (ref 11.5–15.5)
WBC: 5.3 K/uL (ref 4.0–10.5)
nRBC: 0.4 % — ABNORMAL HIGH (ref 0.0–0.2)

## 2024-03-08 LAB — HEPATIC FUNCTION PANEL
ALT: 51 U/L — ABNORMAL HIGH (ref 0–44)
AST: 57 U/L — ABNORMAL HIGH (ref 15–41)
Albumin: 2 g/dL — ABNORMAL LOW (ref 3.5–5.0)
Alkaline Phosphatase: 67 U/L (ref 38–126)
Bilirubin, Direct: 0.2 mg/dL (ref 0.0–0.2)
Indirect Bilirubin: 0.3 mg/dL (ref 0.3–0.9)
Total Bilirubin: 0.5 mg/dL (ref 0.0–1.2)
Total Protein: 4.2 g/dL — ABNORMAL LOW (ref 6.5–8.1)

## 2024-03-08 LAB — BASIC METABOLIC PANEL WITH GFR
Anion gap: 7 (ref 5–15)
BUN: 52 mg/dL — ABNORMAL HIGH (ref 8–23)
CO2: 18 mmol/L — ABNORMAL LOW (ref 22–32)
Calcium: 8.2 mg/dL — ABNORMAL LOW (ref 8.9–10.3)
Chloride: 116 mmol/L — ABNORMAL HIGH (ref 98–111)
Creatinine, Ser: 2.73 mg/dL — ABNORMAL HIGH (ref 0.61–1.24)
GFR, Estimated: 25 mL/min — ABNORMAL LOW (ref >60)
Glucose, Bld: 88 mg/dL (ref 70–99)
Potassium: 4.7 mmol/L (ref 3.5–5.1)
Sodium: 141 mmol/L (ref 135–145)

## 2024-03-08 LAB — LIPASE, BLOOD: Lipase: 81 U/L — ABNORMAL HIGH (ref 11–51)

## 2024-03-08 LAB — BRAIN NATRIURETIC PEPTIDE: B Natriuretic Peptide: 213.8 pg/mL — ABNORMAL HIGH (ref 0.0–100.0)

## 2024-03-08 LAB — TROPONIN I (HIGH SENSITIVITY)
Troponin I (High Sensitivity): 12 ng/L (ref <18)
Troponin I (High Sensitivity): 8 ng/L (ref <18)

## 2024-03-08 LAB — PREPARE RBC (CROSSMATCH)

## 2024-03-08 MED ORDER — ACETAMINOPHEN 325 MG PO TABS: 650.0000 mg | ORAL_TABLET | Freq: Four times a day (QID) | ORAL | Status: DC | PRN

## 2024-03-08 MED ORDER — SIMVASTATIN 20 MG PO TABS
20.0000 mg | ORAL_TABLET | Freq: Every day | ORAL | Status: DC
  Administered 2024-03-09 – 2024-03-10 (×2): 20 mg via ORAL
  Filled 2024-03-08 (×2): qty 1

## 2024-03-08 MED ORDER — CLOPIDOGREL BISULFATE 75 MG PO TABS: 75.0000 mg | ORAL_TABLET | Freq: Every day | ORAL | Status: DC

## 2024-03-08 MED ORDER — ALLOPURINOL 100 MG PO TABS
100.0000 mg | ORAL_TABLET | Freq: Every day | ORAL | Status: DC
  Administered 2024-03-09 – 2024-03-11 (×3): 100 mg via ORAL
  Filled 2024-03-08 (×3): qty 1

## 2024-03-08 MED ORDER — MAGNESIUM HYDROXIDE 400 MG/5ML PO SUSP
30.0000 mL | Freq: Every day | ORAL | Status: DC | PRN
Start: 2024-03-08 — End: 2024-03-11

## 2024-03-08 MED ORDER — FUROSEMIDE 10 MG/ML IJ SOLN
20.0000 mg | Freq: Two times a day (BID) | INTRAMUSCULAR | Status: DC
  Administered 2024-03-08 – 2024-03-09 (×2): 20 mg via INTRAVENOUS
  Filled 2024-03-08 (×2): qty 4

## 2024-03-08 MED ORDER — TRIAMCINOLONE ACETONIDE 0.1 % EX OINT
1.0000 | TOPICAL_OINTMENT | Freq: Two times a day (BID) | CUTANEOUS | Status: DC | PRN
  Filled 2024-03-08: qty 15

## 2024-03-08 MED ORDER — PANTOPRAZOLE SODIUM 40 MG IV SOLR
40.0000 mg | Freq: Two times a day (BID) | INTRAVENOUS | Status: DC
  Administered 2024-03-08 – 2024-03-11 (×6): 40 mg via INTRAVENOUS
  Filled 2024-03-08 (×6): qty 10

## 2024-03-08 MED ORDER — ACETAMINOPHEN 650 MG RE SUPP: 650.0000 mg | Freq: Four times a day (QID) | RECTAL | Status: DC | PRN

## 2024-03-08 MED ORDER — VITAMIN D 25 MCG (1000 UNIT) PO TABS
2000.0000 [IU] | ORAL_TABLET | Freq: Every day | ORAL | Status: DC
  Administered 2024-03-09 – 2024-03-11 (×3): 2000 [IU] via ORAL
  Filled 2024-03-08 (×3): qty 2

## 2024-03-08 MED ORDER — ONDANSETRON HCL 4 MG/2ML IJ SOLN
4.0000 mg | Freq: Four times a day (QID) | INTRAMUSCULAR | Status: DC | PRN
Start: 2024-03-08 — End: 2024-03-11

## 2024-03-08 MED ORDER — LISINOPRIL 20 MG PO TABS
40.0000 mg | ORAL_TABLET | Freq: Every day | ORAL | Status: DC
  Administered 2024-03-09 – 2024-03-11 (×3): 40 mg via ORAL
  Filled 2024-03-08: qty 4
  Filled 2024-03-08 (×2): qty 2

## 2024-03-08 MED ORDER — AMIODARONE HCL 200 MG PO TABS
200.0000 mg | ORAL_TABLET | Freq: Two times a day (BID) | ORAL | Status: DC
  Administered 2024-03-08 – 2024-03-11 (×6): 200 mg via ORAL
  Filled 2024-03-08 (×6): qty 1

## 2024-03-08 MED ORDER — CHLORTHALIDONE 25 MG PO TABS
25.0000 mg | ORAL_TABLET | Freq: Every day | ORAL | Status: DC
  Administered 2024-03-09 – 2024-03-11 (×3): 25 mg via ORAL
  Filled 2024-03-08 (×3): qty 1

## 2024-03-08 MED ORDER — ASPIRIN 81 MG PO TBEC: 81.0000 mg | DELAYED_RELEASE_TABLET | ORAL | Status: DC

## 2024-03-08 MED ORDER — SODIUM CHLORIDE 0.9 % IV SOLN
10.0000 mL/h | Freq: Once | INTRAVENOUS | Status: AC
  Administered 2024-03-08: 10 mL/h via INTRAVENOUS

## 2024-03-08 MED ORDER — SODIUM CHLORIDE 0.9 % IV SOLN: INTRAVENOUS | Status: DC

## 2024-03-08 MED ORDER — POLYSACCHARIDE IRON COMPLEX 150 MG PO CAPS
150.0000 mg | ORAL_CAPSULE | Freq: Every day | ORAL | Status: DC
  Administered 2024-03-09: 150 mg via ORAL
  Filled 2024-03-08: qty 1

## 2024-03-08 MED ORDER — ONDANSETRON HCL 4 MG PO TABS: 4.0000 mg | ORAL_TABLET | Freq: Four times a day (QID) | ORAL | Status: DC | PRN

## 2024-03-08 MED ORDER — FUROSEMIDE 10 MG/ML IJ SOLN
20.0000 mg | Freq: Once | INTRAMUSCULAR | Status: AC
  Administered 2024-03-08: 20 mg via INTRAVENOUS
  Filled 2024-03-08: qty 4

## 2024-03-08 MED ORDER — TRAZODONE HCL 50 MG PO TABS
25.0000 mg | ORAL_TABLET | Freq: Every evening | ORAL | Status: DC | PRN
  Administered 2024-03-09 – 2024-03-10 (×2): 25 mg via ORAL
  Filled 2024-03-08 (×2): qty 1

## 2024-03-08 MED ORDER — ENOXAPARIN SODIUM 60 MG/0.6ML IJ SOSY: 60.0000 mg | PREFILLED_SYRINGE | INTRAMUSCULAR | Status: DC

## 2024-03-08 MED ORDER — ZOLPIDEM TARTRATE 5 MG PO TABS
5.0000 mg | ORAL_TABLET | Freq: Every day | ORAL | Status: DC
Start: 2024-03-08 — End: 2024-03-11
  Administered 2024-03-08 – 2024-03-10 (×3): 5 mg via ORAL
  Filled 2024-03-08 (×3): qty 1

## 2024-03-08 MED ORDER — EZETIMIBE 10 MG PO TABS
10.0000 mg | ORAL_TABLET | Freq: Every day | ORAL | Status: DC
  Administered 2024-03-09 – 2024-03-11 (×3): 10 mg via ORAL
  Filled 2024-03-08 (×3): qty 1

## 2024-03-08 MED ORDER — ATENOLOL 25 MG PO TABS
25.0000 mg | ORAL_TABLET | Freq: Every day | ORAL | Status: DC
  Administered 2024-03-09: 25 mg via ORAL
  Filled 2024-03-08: qty 1

## 2024-03-08 NOTE — ED Notes (Signed)
 LAC IV was painful when flushed, removed at this time. Primary rn made aware.  Float RN sent to pick up 2nd unit of blood at this time

## 2024-03-08 NOTE — ED Notes (Signed)
 Purwick placed and attached to suction at this time.

## 2024-03-08 NOTE — Assessment & Plan Note (Signed)
-   The patient will be diuresed with IV Lasix  with transfusion. - Will follow serial troponins. - Will follow electrolytes. - Will follow I's and O's. - Will obtain a 2D echo. - This could be partly high output failure due to symptomatic anemia.

## 2024-03-08 NOTE — Assessment & Plan Note (Signed)
-   The patient be admitted to a progressive unit bed. - He was typed and crossmatch and will be transfused units of packed red blood cells. - Will follow posttransfusion H&H. - Will place him on IV PPI therapy with Protonix . - Will hold off aspirin  and Plavix . - GI consult to be obtained. - I notified Dr. Aundria about the patient

## 2024-03-08 NOTE — H&P (Signed)
 [place]   PATIENT NAME: Kevin Gross    MR#:  [id number]  DATE OF BIRTH:  1956/06/15  DATE OF ADMISSION:  03/08/2024  PRIMARY CARE PHYSICIAN: Autry Grayce LABOR, PA   Patient is coming from: Home  REQUESTING/REFERRING PHYSICIAN: Clarine Sharper, MD  CHIEF COMPLAINT:   Chief Complaint  Patient presents with   Leg Swelling        Shortness of Breath    HISTORY OF PRESENT ILLNESS:  Kevin Gross is a 68 y.o. Caucasian male with medical history significant for alcoholic liver cirrhosis, CHF, stage III CKD, gout, anemia, hypertension, dyslipidemia, peripheral vascular disease, OSA on CPAP, who presented to the emergency room with acute onset of generalized weakness and fatigue with worsening lower extremity edema as well as dyspnea.  He admitted to dyspnea on exertion but denied any orthopnea or paroxysmal nocturnal dyspnea.  No cough or wheezing or hemoptysis.  No fever or chills.  He was seen by his NP and advised to come to the emergency room.  No melena or bright red bleeding per rectum or hematemesis or jaundice.  No nausea or vomiting or diarrhea.  No dysuria, oliguria or hematuria or flank pain.  No other bleeding diathesis.  ED Course: When the patient came to the ER, BP was 117/46 with a heart rate of 46 and otherwise normal vital signs.  Labs revealed CO2 of 18 and BUN of 52 with a creatinine of 2.73 close to previous levels.  LFTs revealed AST 57 ALT 51 and troponin of 4.2 with albumin  2.  High sensitive troponin I was 1208.  BNP was 213.8.  CBC showed hemoglobin of 5.2 hematocrit 19.6 compared to 7.8 and 27.3 on 11/10/2023 with microcytosis.  Blood group was A+ with negative antibody screen.  UA came back negative.  Stool Hemoccult was weakly positive per EDP. EKG as reviewed by me : EKG showed junctional rhythm with rate of 48 with low voltage QRS. Imaging: Chest x-ray showed no acute cardiopulmonary disease.  The patient was given 20 mg of IV Lasix  and was typed and  crossmatched and is being transfused 2 units of packed red blood cells.  He will be admitted to a progressive unit bed for further evaluation and management. PAST MEDICAL HISTORY:   Past Medical History:  Diagnosis Date   Alcoholic cirrhosis of liver (HCC)    Anemia 2024   Cataract 2024   CHF (congestive heart failure) (HCC)    Chronic gouty arthritis    CKD (chronic kidney disease), stage III (HCC)    Clotting disorder (HCC)    Bilateral legs-pt states plaque build up in both legs requiring intervention   Complication of anesthesia    pt states that years ago during colonoscopy at [facility] they had to abort the procedure due to some cardiac issue had to see cardiologist   COPD (chronic obstructive pulmonary disease) (HCC)    Hyperlipidemia    Hypertension    Hyperuricemia    Peripheral arterial disease (HCC)    Pre-diabetes    Prostate cancer (HCC) 2023   Sleep apnea    uses cpap    PAST SURGICAL HISTORY:   Past Surgical History:  Procedure Laterality Date   ABDOMINAL AORTOGRAM W/LOWER EXTREMITY N/A 10/20/2021   Procedure: ABDOMINAL AORTOGRAM W/LOWER EXTREMITY;  Surgeon: Darron Deatrice LABOR, MD;  Location: [facility];  Service: Cardiovascular;  Laterality: N/A;   ABDOMINAL AORTOGRAM W/LOWER EXTREMITY N/A 08/08/2023   Procedure: ABDOMINAL AORTOGRAM W/LOWER  EXTREMITY;  Surgeon: Serene Gaile ORN, MD;  Location: [facility];  Service: Cardiovascular;  Laterality: N/A;   ALLOGRAFT APPLICATION Bilateral 12/22/2021   Procedure: ALLOGRAFT APPLICATION;  Surgeon: Serene Gaile ORN, MD;  Location: [facility] OR;  Service: Vascular;  Laterality: Bilateral;   ANGIOPLASTY Left 11/26/2021   Procedure: LEFT INFRARENAL LITHOTRIPSY;  Surgeon: Serene Gaile ORN, MD;  Location: [facility] OR;  Service: Vascular;  Laterality: Left;   APPLICATION OF WOUND VAC Bilateral 12/13/2021   Procedure: APPLICATION OF WOUND VAC;  Surgeon: Lanis Fonda BRAVO, MD;  Location: [facility] OR;  Service: Vascular;  Laterality:  Bilateral;   CARDIAC CATHETERIZATION     [facility]   CATARACT EXTRACTION W/PHACO Left 04/04/2023   Procedure: CATARACT EXTRACTION PHACO AND INTRAOCULAR LENS PLACEMENT (IOC) LEFT 8.71 00:45.5;  Surgeon: Jaye Fallow, MD;  Location: [facility];  Service: Ophthalmology;  Laterality: Left;   CATARACT EXTRACTION W/PHACO Right 04/18/2023   Procedure: CATARACT EXTRACTION PHACO AND INTRAOCULAR LENS PLACEMENT (IOC) RIGHT 4.31 00:8.2;  Surgeon: Jaye Fallow, MD;  Location: [facility];  Service: Ophthalmology;  Laterality: Right;   COLONOSCOPY     COLONOSCOPY WITH PROPOFOL  N/A 01/17/2018   Procedure: COLONOSCOPY WITH PROPOFOL ;  Surgeon: Toledo, Ladell POUR, MD;  Location: [facility];  Service: Gastroenterology;  Laterality: N/A;   COLONOSCOPY WITH PROPOFOL  N/A 09/12/2022   Procedure: COLONOSCOPY WITH PROPOFOL ;  Surgeon: Maryruth Ole DASEN, MD;  Location: [facility];  Service: Endoscopy;  Laterality: N/A;   COLONOSCOPY WITH PROPOFOL  N/A 09/05/2023   Procedure: COLONOSCOPY WITH PROPOFOL ;  Surgeon: Maryruth Ole DASEN, MD;  Location: [facility];  Service: Endoscopy;  Laterality: N/A;   ENDARTERECTOMY FEMORAL Bilateral 11/26/2021   Procedure: BILATERAL FEMORAL ENDARTERECTOMY WITH VEIN PATCH ANGIOPLASTY;  Surgeon: Serene Gaile ORN, MD;  Location: [facility];  Service: Vascular;  Laterality: Bilateral;   ENTEROSCOPY N/A 09/05/2023   Procedure: ENTEROSCOPY;  Surgeon: Maryruth Ole DASEN, MD;  Location: [facility];  Service: Endoscopy;  Laterality: N/A;   ESOPHAGOGASTRODUODENOSCOPY (EGD) WITH PROPOFOL  N/A 01/17/2018   Procedure: ESOPHAGOGASTRODUODENOSCOPY (EGD) WITH PROPOFOL ;  Surgeon: Toledo, Ladell POUR, MD;  Location: [facility];  Service: Gastroenterology;  Laterality: N/A;   ESOPHAGOGASTRODUODENOSCOPY (EGD) WITH PROPOFOL  N/A 09/12/2022   Procedure: ESOPHAGOGASTRODUODENOSCOPY (EGD) WITH PROPOFOL ;  Surgeon: Maryruth Ole DASEN, MD;  Location: [facility];  Service: Endoscopy;   Laterality: N/A;   EYE SURGERY     GROIN DEBRIDEMENT Bilateral 12/16/2021   Procedure: IRRIGATION AND DEBRIDEMENT OF BILATERAL GROINS;  Surgeon: Serene Gaile ORN, MD;  Location: [facility];  Service: Vascular;  Laterality: Bilateral;   INCISION AND DRAINAGE OF WOUND Bilateral 12/13/2021   Procedure: IRRIGATION AND DEBRIDEMENT OF BILATERAL GROIN WOUNDS;  Surgeon: Lanis Fonda BRAVO, MD;  Location: [facility] OR;  Service: Vascular;  Laterality: Bilateral;   INSERTION OF ILIAC STENT Bilateral 11/26/2021   Procedure: BILATERAL ILIAC STENTING USING 15mmX80mm INNOVA STENT ON LEFT ILIAC AND 58mmX59mm, 30mmX79mm VBX AND 49mmX7.5cm VIABAHN STENT ON RIGHT ILIAC;  Surgeon: Serene Gaile ORN, MD;  Location: [facility];  Service: Vascular;  Laterality: Bilateral;   INSERTION OF SEEDS IN PROSTATE     ORIF ANKLE FRACTURE Right    PERIPHERAL INTRAVASCULAR LITHOTRIPSY  08/08/2023   Procedure: PERIPHERAL INTRAVASCULAR LITHOTRIPSY;  Surgeon: Serene Gaile ORN, MD;  Location[facility];  Service: Cardiovascular;;   PROSTATE BIOPSY N/A 04/01/2021   Procedure: PROSTATE BIOPSY GRAYCE;  Surgeon: Kassie Ozell SAUNDERS, MD;  Location: [facility] ORS;  Service: Urology;  Laterality: N/A;   VEIN HARVEST Bilateral 11/26/2021  Procedure: VEIN HARVEST OF BILATERAL SPAHENOUS VEINS;  Surgeon: Serene Gaile ORN, MD;  Location: [facility];  Service: Vascular;  Laterality: Bilateral;   WOUND DEBRIDEMENT Bilateral 12/22/2021   Procedure: INCISION AND DEBRIDEMENT OF BILATERAL GROINS;  Surgeon: Serene Gaile ORN, MD;  Location: [facility];  Service: Vascular;  Laterality: Bilateral;    SOCIAL HISTORY:   Social History   Tobacco Use   Smoking status: Former    Current packs/day: 0.00    Average packs/day: 1 pack/day for 47.0 years (47.0 ttl pk-yrs)    Types: Cigarettes    Start date: 40    Quit date: 2016    Years since quitting: 9.5    Passive exposure: Never   Smokeless tobacco: Never  Substance Use Topics   Alcohol use: Not Currently    FAMILY  HISTORY:   Family History  Problem Relation Age of Onset   Heart disease Mother    Heart disease Brother     DRUG ALLERGIES:   Allergies  Allergen Reactions   Jardiance [Empagliflozin]     Loss of balance, hypoglycemic     REVIEW OF SYSTEMS:   ROS As per history of present illness. All pertinent systems were reviewed above. Constitutional, HEENT, cardiovascular, respiratory, GI, GU, musculoskeletal, neuro, psychiatric, endocrine, integumentary and hematologic systems were reviewed and are otherwise negative/unremarkable except for positive findings mentioned above in the HPI.   MEDICATIONS AT HOME:   Prior to Admission medications   Medication Sig Start Date End Date Taking? Authorizing Provider  allopurinol  (ZYLOPRIM ) 100 MG tablet Take 100 mg by mouth daily.   Yes [provider]  allopurinol  (ZYLOPRIM ) 300 MG tablet Take 600 mg by mouth daily. 02/23/24  Yes [provider]  aspirin  EC 81 MG tablet Take 81 mg by mouth every morning.   Yes [provider]  atenolol  (TENORMIN ) 25 MG tablet Take 25 mg by mouth daily.   Yes [provider]  chlorthalidone  (HYGROTON ) 25 MG tablet Take 25 mg by mouth daily.   Yes [provider]  Cholecalciferol  (VITAMIN D3) 25 MCG (1000 UT) CAPS Take 2,000 Units by mouth daily.   Yes [provider]  clopidogrel  (PLAVIX ) 75 MG tablet Take 1 tablet (75 mg total) by mouth daily. 01/16/23  Yes Baglia, Corrina, PA-C  ezetimibe  (ZETIA ) 10 MG tablet Take 1 tablet (10 mg total) by mouth daily. 04/10/18  Yes Gollan, Timothy J, MD  Iron  Combinations (NIFEREX) TABS Take 1 tablet by mouth once a day 09/15/22  Yes [provider]  lisinopril  (ZESTRIL ) 40 MG tablet Take 40 mg by mouth daily.   Yes [provider]  simvastatin  (ZOCOR ) 20 MG tablet Take 20 mg by mouth in the morning.   Yes [provider]  triamcinolone  ointment (KENALOG ) 0.1 % Apply 1 Application topically 2 (two) times  daily as needed (psoriasis). 07/18/23  Yes [provider]  zolpidem  (AMBIEN ) 10 MG tablet Take 10 mg by mouth at bedtime.   Yes [provider]  albuterol  (VENTOLIN  HFA) 108 (90 Base) MCG/ACT inhaler Inhale 2 puffs into the lungs every 6 (six) hours as needed for wheezing or shortness of breath. Patient not taking: Reported on 03/08/2024    [provider]  amiodarone  (PACERONE ) 200 MG tablet Take 200 mg by mouth 2 (two) times daily.    [provider]  cyanocobalamin  (VITAMIN B12) 500 MCG tablet Take 500 mcg by mouth daily. Patient not taking: Reported on 03/08/2024    [provider]  iron  polysaccharides (NIFEREX) 150 MG capsule Take 1 capsule (150 mg total) by mouth daily. 09/14/22 09/05/23  Von Bellis, MD      VITAL SIGNS:  Blood pressure (!) 138/58, pulse (!) 43, temperature 98.5 F (36.9 C), temperature source Oral, resp. rate 10, height 5' 10 (1.778 m), weight 121.6 kg, SpO2 100%.  PHYSICAL EXAMINATION:  Physical Exam  GENERAL:  68 y.o.-year-old Caucasian male patient lying in the bed with no acute distress.  He had mild conversational dyspnea though. EYES: Pupils equal, round, reactive to light and accommodation.  Positive pallor.  No scleral icterus. Extraocular muscles intact.  HEENT: Head atraumatic, normocephalic. Oropharynx and nasopharynx clear.  NECK:  Supple, no jugular venous distention. No thyroid enlargement, no tenderness.  LUNGS: Slightly diminished bibasal breath sounds.  No use of accessory muscles of respiration.  CARDIOVASCULAR: Regular rate and rhythm, S1, S2 normal. No murmurs, rubs, or gallops.  ABDOMEN: Soft, nondistended, nontender. Bowel sounds present. No organomegaly or mass.  EXTREMITIES: 2+ bilateral lower extremity pitting edema with no cyanosis, or clubbing.  NEUROLOGIC: Cranial nerves II through XII are intact. Muscle strength 5/5 in all extremities. Sensation intact. Gait not checked.  PSYCHIATRIC: The  patient is alert and oriented x 3.  Normal affect and good eye contact. SKIN: No obvious rash, lesion, or ulcer.   LABORATORY PANEL:   CBC Recent Labs  Lab 03/08/24 1620  WBC 5.3  HGB 5.2*  HCT 19.6*  PLT 233   ------------------------------------------------------------------------------------------------------------------  Chemistries  Recent Labs  Lab 03/08/24 1620 03/08/24 1842  NA 141  --   K 4.7  --   CL 116*  --   CO2 18*  --   GLUCOSE 88  --   BUN 52*  --   CREATININE 2.73*  --   CALCIUM  8.2*  --   AST  --  57*  ALT  --  51*  ALKPHOS  --  67  BILITOT  --  0.5   ------------------------------------------------------------------------------------------------------------------  Cardiac Enzymes No results for input(s): TROPONINI in the last 168 hours. ------------------------------------------------------------------------------------------------------------------  RADIOLOGY:  DG Chest 2 View Result Date: 03/08/2024 CLINICAL DATA:  Shortness of breath.  Lower extremity edema. EXAM: CHEST - 2 VIEW COMPARISON:  October 19, 2022. FINDINGS: Stable cardiomediastinal silhouette. Both lungs are clear. The visualized skeletal structures are unremarkable. IMPRESSION: No active cardiopulmonary disease. Electronically Signed   By: Lynwood Landy Raddle M.D.   On: 03/08/2024 17:51      IMPRESSION AND PLAN:  Assessment and Plan: * ABLA (acute blood loss anemia) - The patient be admitted to a progressive unit bed. - He was typed and crossmatch and will be transfused units of packed red blood cells. - Will follow posttransfusion H&H. - Will place him on IV PPI therapy with Protonix . - Will hold off aspirin  and Plavix . - GI consult to be obtained. - I notified Dr. Aundria about the patient  Acute on chronic diastolic CHF (congestive heart failure) (HCC) - The patient will be diuresed with IV Lasix  with transfusion. - Will follow serial troponins. - Will follow  electrolytes. - Will follow I's and O's. - Will obtain a 2D echo. - This could be partly high output failure due to symptomatic anemia.  Essential hypertension - Will continue antihypertensives.  Paroxysmal atrial fibrillation (HCC) - Will continue atenolol  and amiodarone .  Dyslipidemia - Will continue statin therapy as well as Zetia .   DVT prophylaxis: This.  Medical prophylaxis currently contraindicated due to possible GI bleeding. Advanced Care Planning:  Code Status: full code. Family Communication:  The plan of care was discussed in details with the patient (and family). I answered all questions. The patient agreed to proceed with the above mentioned plan. Further management will depend upon hospital course. Disposition Plan: Back to previous home environment Consults called: GI All the records are reviewed and case discussed with ED provider.  Status is: Inpatient   At the time of the admission, it appears that the appropriate admission status for this patient is inpatient.  This is judged to be reasonable and necessary in order to provide the required intensity of service to ensure the patient's safety given the presenting symptoms, physical exam findings and initial radiographic and laboratory data in the context of comorbid conditions.  The patient requires inpatient status due to high intensity of service, high risk of further deterioration and high frequency of surveillance required.  I certify that at the time of admission, it is my clinical judgment that the patient will require inpatient hospital care extending more than 2 midnights.                            Dispo: The patient is from: Home              Anticipated d/c is to: Home              Patient currently is not medically stable to d/c.              Difficult to place patient: No  Madison DELENA Peaches M.D on 03/08/2024 at 9:27 PM  [facility]   From 7 PM-7 AM, contact night-coverage [email or web address]  CC:  Primary care physician; Autry Grayce DELENA, PA

## 2024-03-08 NOTE — Assessment & Plan Note (Signed)
-   Will continue atenolol  and amiodarone .

## 2024-03-08 NOTE — Assessment & Plan Note (Signed)
-   Will continue statin therapy as well as Zetia .

## 2024-03-08 NOTE — ED Provider Triage Note (Signed)
 Emergency Medicine Provider Triage Evaluation Note  Kevin Gross , a 68 y.o. male  was evaluated in triage.  Pt complains of SOB, labored breathing, increased lower leg edema b/l w/ weeping of left leg. Recent 20 lb wt gain since April. Hx of CHF.   Physical Exam  BP (!) 117/46   Pulse (!) 46   Temp 98.4 F (36.9 C) (Oral)   Resp 16   Ht 5' 10 (1.778 m)   Wt 121.6 kg   SpO2 97%   BMI 38.45 kg/m  Gen:   Awake, no distress   Resp:  Normal effort  MSK:   Moves extremities without difficulty  Other:  3+ b/l pitting edema of lower legs, weeping on left but not tender to palpation. Appears pale.  Medical Decision Making  Medically screening exam initiated at 4:29 PM.  Appropriate orders placed.  Kevin Gross was informed that the remainder of the evaluation will be completed by another provider, this initial triage assessment does not replace that evaluation, and the importance of remaining in the ED until their evaluation is complete.  CBC, BMP, Troponin, BNP, EKG, CXR, T&S, ABO/Rh   Sheron Salm, PA-C 03/08/24 [phone]

## 2024-03-08 NOTE — ED Provider Notes (Signed)
 [facility] Provider Note    Event Date/Time   First MD Initiated Contact with Patient 03/08/24 1651     (approximate)   History   Leg Swelling (/) and Shortness of Breath  Pt to ED via POV from PCP. Pt ambulatory to triage but with labored breathing. Pt reports increased SOB over the past few weeks and lower leg edema with some weeping. Pt reports hx of CHF. Pt reports 20lb weight gain since April. Pt denies CP. Pt appears pale and reports hx of blood transfusion. No known bleeding.     HPI Kevin Gross is a 68 y.o. male PMH CHF, CKD, COPD, alcoholic cirrhosis of liver, anemia, CAD on Plavix , paroxysmal A-fib not on anticoagulation presents for evaluation of shortness of breath, weight gain, leg swelling - Patient has had over the past 2 weeks he has felt shortness of breath with exertion and has been having worsening bilateral lower extremity edema with some weeping. - No fever, cough, chest or abdominal pain - Takes all of his medications as prescribed it does note that he ran out of his iron  about 1 week ago and they have been having difficulty getting a refill from his prescribing provider - Denies black or bloody stools, hematemesis, hematuria - Unsure if he is on a diuretic  Last ECHO 10/2021, normal EF, no definite diastolic dysfunction, no significant valvular pathology identified.  Appears has also had extensive workup of his anemia including EGD in 2025, did show nonbleeding superficial duodenal ulcer and angiectasia in the duodenum and gastric body that were treated with argon plasma beam.  Studies consistent with iron  deficiency anemia.  Is followed by hematology.  On Venofer .   Last recorded weight on 01/01/2024 was 114.3 kg, today is 121.6 kg.       Physical Exam   Triage Vital Signs: ED Triage Vitals  Encounter Vitals Group     BP 03/08/24 1628 (!) 117/46     Girls Systolic BP Percentile --      Girls Diastolic BP Percentile --       Boys Systolic BP Percentile --      Boys Diastolic BP Percentile --      Pulse Rate 03/08/24 1621 (!) 46     Resp 03/08/24 1621 16     Temp 03/08/24 1621 98.4 F (36.9 C)     Temp Source 03/08/24 1621 Oral     SpO2 03/08/24 1621 97 %     Weight 03/08/24 1618 268 lb (121.6 kg)     Height 03/08/24 1618 5' 10 (1.778 m)     Head Circumference --      Peak Flow --      Pain Score 03/08/24 1618 4     Pain Loc --      Pain Education --      Exclude from Growth Chart --     Most recent vital signs: Vitals:   03/08/24 2206 03/08/24 2221  BP: (!) 136/57 (!) 150/54  Pulse: (!) 44 (!) 45  Resp: 13 15  Temp: 98.5 F (36.9 C) 98.9 F (37.2 C)  SpO2:  100%     General: Awake, no distress.  CV:  Good peripheral perfusion. RRR, RP 2+, 2+ pitting edema BLE Resp:  Normal effort. CTAB Abd:  No distention. Nontender to deep palpation throughout Rectal:  Light brown stool, no blood visible blood/melena, Hemoccult faintly positive   ED Results / Procedures / Treatments   Labs (all labs  ordered are listed, but only abnormal results are displayed) Labs Reviewed  BASIC METABOLIC PANEL WITH GFR - Abnormal; Notable for the following components:      Result Value   Chloride 116 (*)    CO2 18 (*)    BUN 52 (*)    Creatinine, Ser 2.73 (*)    Calcium  8.2 (*)    GFR, Estimated 25 (*)    All other components within normal limits  CBC - Abnormal; Notable for the following components:   RBC 2.44 (*)    Hemoglobin 5.2 (*)    HCT 19.6 (*)    MCH 21.3 (*)    MCHC 26.5 (*)    RDW 21.1 (*)    nRBC 0.4 (*)    All other components within normal limits  BRAIN NATRIURETIC PEPTIDE - Abnormal; Notable for the following components:   B Natriuretic Peptide 213.8 (*)    All other components within normal limits  HEPATIC FUNCTION PANEL - Abnormal; Notable for the following components:   Total Protein 4.2 (*)    Albumin  2.0 (*)    AST 57 (*)    ALT 51 (*)    All other components within normal  limits  LIPASE, BLOOD - Abnormal; Notable for the following components:   Lipase 81 (*)    All other components within normal limits  URINALYSIS, COMPLETE (UACMP) WITH MICROSCOPIC - Abnormal; Notable for the following components:   Color, Urine STRAW (*)    APPearance CLEAR (*)    All other components within normal limits  HIV ANTIBODY (ROUTINE TESTING W REFLEX)  BASIC METABOLIC PANEL WITH GFR  CBC  HEMOGLOBIN AND HEMATOCRIT, BLOOD  HEMOGLOBIN AND HEMATOCRIT, BLOOD  TYPE AND SCREEN  PREPARE RBC (CROSSMATCH)  TROPONIN I (HIGH SENSITIVITY)  TROPONIN I (HIGH SENSITIVITY)     EKG  Ecg = junctional rhythm, rate 48, no gross ST elevation or depression, no significant repolarization abnormality.  No clear evidence of ischemia on my interpretation.  Similar to comparison from 07/2023.  RADIOLOGY Radiology interpreted by myself and radiology report reviewed.  No evidence of acute pathology.    PROCEDURES:  Critical Care performed: Yes, see critical care procedure note(s)  .Critical Care  Performed by: Clarine Ozell LABOR, MD Authorized by: Clarine Ozell LABOR, MD   Critical care provider statement:    Critical care time (minutes):  30   Critical care time was exclusive of:  Separately billable procedures and treating other patients   Critical care was necessary to treat or prevent imminent or life-threatening deterioration of the following conditions:  Circulatory failure (anemia requiring transfusion)   Critical care was time spent personally by me on the following activities:  Development of treatment plan with patient or surrogate, discussions with consultants, evaluation of patient's response to treatment, examination of patient, ordering and review of laboratory studies, ordering and review of radiographic studies, ordering and performing treatments and interventions, pulse oximetry, re-evaluation of patient's condition and review of old charts   I assumed direction of critical care  for this patient from another provider in my specialty: no      MEDICATIONS ORDERED IN ED: Medications  allopurinol  (ZYLOPRIM ) tablet 100 mg (has no administration in time range)  amiodarone  (PACERONE ) tablet 200 mg (200 mg Oral Given 03/08/24 2154)  atenolol  (TENORMIN ) tablet 25 mg (has no administration in time range)  chlorthalidone  (HYGROTON ) tablet 25 mg (has no administration in time range)  ezetimibe  (ZETIA ) tablet 10 mg (has no administration in time range)  lisinopril  (ZESTRIL ) tablet 40 mg (has no administration in time range)  zolpidem  (AMBIEN ) tablet 5 mg (5 mg Oral Given 03/08/24 2154)  simvastatin  (ZOCOR ) tablet 20 mg (has no administration in time range)  iron  polysaccharides (NIFEREX) capsule 150 mg (has no administration in time range)  cholecalciferol  (VITAMIN D3) 25 MCG (1000 UNIT) tablet 2,000 Units (has no administration in time range)  triamcinolone  ointment (KENALOG ) 0.1 % 1 Application (has no administration in time range)  0.9 %  sodium chloride  infusion (0 mLs Intravenous Paused 03/08/24 2208)  acetaminophen  (TYLENOL ) tablet 650 mg (has no administration in time range)    Or  acetaminophen  (TYLENOL ) suppository 650 mg (has no administration in time range)  traZODone  (DESYREL ) tablet 25 mg (has no administration in time range)  magnesium  hydroxide (MILK OF MAGNESIA) suspension 30 mL (has no administration in time range)  ondansetron  (ZOFRAN ) tablet 4 mg (has no administration in time range)    Or  ondansetron  (ZOFRAN ) injection 4 mg (has no administration in time range)  furosemide  (LASIX ) injection 20 mg (20 mg Intravenous Given 03/08/24 2201)  pantoprazole  (PROTONIX ) injection 40 mg (40 mg Intravenous Given 03/08/24 2145)  0.9 %  sodium chloride  infusion (0 mL/hr Intravenous Stopped 03/08/24 2143)  furosemide  (LASIX ) injection 20 mg (20 mg Intravenous Given 03/08/24 1851)     IMPRESSION / MDM / ASSESSMENT AND PLAN / ED COURSE  I reviewed the triage vital signs  and the nursing notes.                              DDX/MDM/AP: Differential diagnosis includes, but is not limited to, CHF exacerbation, acute on chronic anemia, consider mild underlying GI bleed given pain and positive Hemoccult testing here.  Consider underlying pneumonia, consider but doubt ACS.  Do not clinically suspect PE at this time.  Plan: - Labs - Chest x-ray - EKG - Reassess  Patient's presentation is most consistent with acute presentation with potential threat to life or bodily function.  The patient is on the cardiac monitor to evaluate for evidence of arrhythmia and/or significant heart rate changes.  ED course below.  Workup with acute on chronic anemia down to 5.2, suspect this is likely etiology of patient's fatigue and shortness of breath --transfusing 2 units PRBCs will do slowly given concern for superimposed CHF.  Also consider, cirrhosis contributing to presentation, LFTs added.  Will plan for diuretics in addition to blood transfusion, BP stable.  Given mixed picture with profound anemia and possible trace ongoing bleeding, will plan for admission.  Clinical Course as of 03/08/24 2302  Fri Mar 08, 2024  1730 CBC with anemia at 5.2, most recent hemoglobin 7.83 months ago.  Platelets normal.  WBC is normal. [MM]  1730 BMP within baseline range, creatinine at baseline.  Troponin normal.  BNP newly elevated [MM]  1924 Hospitalist consult order placed [MM]    Clinical Course User Index [MM] Clarine Ozell LABOR, MD     FINAL CLINICAL IMPRESSION(S) / ED DIAGNOSES   Final diagnoses:  Anemia, unspecified type  Acute on chronic congestive heart failure, unspecified heart failure type (HCC)     Rx / DC Orders   ED Discharge Orders     None        Note:  This document was prepared using Dragon voice recognition software and may include unintentional dictation errors.   Clarine Ozell LABOR, MD 03/08/24 2302

## 2024-03-08 NOTE — ED Notes (Signed)
Paper consent signed for blood.

## 2024-03-08 NOTE — Assessment & Plan Note (Signed)
-   Will continue antihypertensives.  

## 2024-03-08 NOTE — ED Triage Notes (Addendum)
 Pt to ED via POV from PCP. Pt ambulatory to triage but with labored breathing. Pt reports increased SOB over the past few weeks and lower leg edema with some weeping. Pt reports hx of CHF. Pt reports 20lb weight gain since April. Pt denies CP. Pt appears pale and reports hx of blood transfusion. No known bleeding.

## 2024-03-09 ENCOUNTER — Inpatient Hospital Stay
Admit: 2024-03-09 | Discharge: 2024-03-09 | Disposition: A | Discharge status: Home / Self Care | Attending: Family Medicine | Admitting: Family Medicine

## 2024-03-09 DIAGNOSIS — D62 Acute posthemorrhagic anemia: Secondary | ICD-10-CM | POA: Diagnosis not present

## 2024-03-09 DIAGNOSIS — I5033 Acute on chronic diastolic (congestive) heart failure: Secondary | ICD-10-CM

## 2024-03-09 DIAGNOSIS — D649 Anemia, unspecified: Secondary | ICD-10-CM | POA: Diagnosis not present

## 2024-03-09 LAB — CBC
HCT: 24.1 % — ABNORMAL LOW (ref 39.0–52.0)
Hemoglobin: 6.9 g/dL — ABNORMAL LOW (ref 13.0–17.0)
MCH: 23 pg — ABNORMAL LOW (ref 26.0–34.0)
MCHC: 28.6 g/dL — ABNORMAL LOW (ref 30.0–36.0)
MCV: 80.3 fL (ref 80.0–100.0)
Platelets: 208 K/uL (ref 150–400)
RBC: 3 MIL/uL — ABNORMAL LOW (ref 4.22–5.81)
RDW: 20 % — ABNORMAL HIGH (ref 11.5–15.5)
WBC: 4.5 K/uL (ref 4.0–10.5)
nRBC: 0 % (ref 0.0–0.2)

## 2024-03-09 LAB — ECHOCARDIOGRAM COMPLETE
AR max vel: 1.75 cm2
AV Peak grad: 12 mmHg
Ao pk vel: 1.73 m/s
Area-P 1/2: 2.11 cm2
Height: 70 in
S' Lateral: 4 cm
Weight: 4288 [oz_av]

## 2024-03-09 LAB — IRON AND TIBC
Iron: 22 ug/dL — ABNORMAL LOW (ref 45–182)
Saturation Ratios: 6 % — ABNORMAL LOW (ref 17.9–39.5)
TIBC: 388 ug/dL (ref 250–450)
UIBC: 366 ug/dL

## 2024-03-09 LAB — FERRITIN: Ferritin: 12 ng/mL — ABNORMAL LOW (ref 24–336)

## 2024-03-09 LAB — BASIC METABOLIC PANEL WITH GFR
Anion gap: 8 (ref 5–15)
BUN: 51 mg/dL — ABNORMAL HIGH (ref 8–23)
CO2: 23 mmol/L (ref 22–32)
Calcium: 8.2 mg/dL — ABNORMAL LOW (ref 8.9–10.3)
Chloride: 112 mmol/L — ABNORMAL HIGH (ref 98–111)
Creatinine, Ser: 2.57 mg/dL — ABNORMAL HIGH (ref 0.61–1.24)
GFR, Estimated: 27 mL/min — ABNORMAL LOW (ref >60)
Glucose, Bld: 80 mg/dL (ref 70–99)
Potassium: 4.1 mmol/L (ref 3.5–5.1)
Sodium: 143 mmol/L (ref 135–145)

## 2024-03-09 LAB — HIV ANTIBODY (ROUTINE TESTING W REFLEX): HIV Screen 4th Generation wRfx: NONREACTIVE

## 2024-03-09 LAB — HEMOGLOBIN AND HEMATOCRIT, BLOOD
HCT: 26.9 % — ABNORMAL LOW (ref 39.0–52.0)
Hemoglobin: 7.9 g/dL — ABNORMAL LOW (ref 13.0–17.0)

## 2024-03-09 LAB — VITAMIN B12: Vitamin B-12: 754 pg/mL (ref 180–914)

## 2024-03-09 LAB — PREPARE RBC (CROSSMATCH)

## 2024-03-09 LAB — FOLATE: Folate: 29 ng/mL (ref >5.9)

## 2024-03-09 MED ORDER — SODIUM CHLORIDE 0.9% IV SOLUTION
Freq: Once | INTRAVENOUS | Status: AC
  Filled 2024-03-09: qty 250

## 2024-03-09 MED ORDER — PERFLUTREN LIPID MICROSPHERE
1.0000 mL | INTRAVENOUS | Status: AC | PRN
  Administered 2024-03-09: 6 mL via INTRAVENOUS

## 2024-03-09 MED ORDER — IRON SUCROSE 300 MG IVPB - SIMPLE MED
300.0000 mg | Freq: Every day | Status: AC
  Administered 2024-03-09 – 2024-03-10 (×2): 300 mg via INTRAVENOUS
  Filled 2024-03-09 (×2): qty 300

## 2024-03-09 NOTE — ED Notes (Signed)
 Pt wife calling to speak with pt.

## 2024-03-09 NOTE — TOC Initial Note (Signed)
 Transition of Care [facility]) - Initial/Assessment Note    Patient Details  Name: TIEGAN TERPSTRA MRN: [id number] Date of Birth: 01-19-1956  Transition of [facility]) CM/SW Contact:    [place] L Lylah Lantis, LCSW Phone Number: 03/09/2024, 2:42 PM  Clinical Narrative:                  [facility] consult for SNF placement. Heart Failure clinic needs to consult. TOC will continue to monitor patient for TOC needs.        Patient Goals and CMS Choice            Expected Discharge Plan and Services                                              Prior Living Arrangements/Services                       Activities of Daily Living      Permission Sought/Granted                  Emotional Assessment              Admission diagnosis:  Anemia, unspecified type [D64.9] Acute on chronic congestive heart failure, unspecified heart failure type (HCC) [I50.9] ABLA (acute blood loss anemia) [D62] Patient Active Problem List   Diagnosis Date Noted   ABLA (acute blood loss anemia) 03/08/2024   Acute on chronic diastolic CHF (congestive heart failure) (HCC) 03/08/2024   Dyslipidemia 03/08/2024   Paroxysmal atrial fibrillation (HCC) 03/08/2024   Iron  deficiency anemia 07/17/2023   Symptomatic anemia 09/09/2022   Anemia 12/19/2021   Alcoholic cirrhosis of liver without ascites (HCC) 12/13/2021   Pre-diabetes 12/13/2021   OSA (obstructive sleep apnea) 12/13/2021   Essential hypertension 12/13/2021   Wound infection after surgery 12/12/2021   PAF (paroxysmal atrial fibrillation) (HCC)    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) 11/26/2021   PAD (peripheral artery disease) (HCC) 11/26/2021   Chronic gouty arthritis 04/16/2019   Encounter for long-term (current) use of high-risk medication 04/16/2019   Stage 3b chronic kidney disease (CKD) (HCC) 03/05/2019   Hyperuricemia 03/05/2019   Cellulitis of left lower extremity 02/18/2019    Aortic atherosclerosis (HCC) 04/10/2018   Class 3 obesity 04/10/2018   CAD (coronary artery disease), native coronary artery 04/08/2018   COPD (chronic obstructive pulmonary disease) (HCC) 04/08/2018   Acute kidney injury superimposed on chronic kidney disease (HCC) 04/08/2018   Hx of adenomatous colonic polyps 08/29/2016   PCP:  Autry Grayce LABOR, PA Pharmacy:   [facility] [phone] [place], [place] - [address] AT [facility] [address] [place] [address] Phone: [phone] Fax: [phone]  [facility] - [place], [place] - [address] [place] [address] Phone: [phone] Fax: [phone]     Social Drivers of Health (SDOH) Social History: SDOH Screenings   Food Insecurity: No Food Insecurity (07/17/2023)  Housing: Unknown (01/01/2024)   Received from [facility]  Transportation Needs: No Transportation Needs (07/17/2023)  Utilities: Not At Risk (06/28/2023)   Received from [facility]  Depression [phone]): Low Risk  (07/17/2023)  Financial Resource Strain: Low Risk  (07/17/2023)  Tobacco Use: Medium Risk (03/08/2024)   SDOH  Interventions:     Readmission Risk Interventions    09/13/2022   11:09 AM 12/24/2021   12:03 PM 12/06/2021   10:01 AM  Readmission Risk Prevention Plan  Transportation Screening Complete Complete Complete  PCP or Specialist Appt within 5-7 Days  Complete Complete  Home Care Screening Patient refused Complete Complete  Medication Review (RN CM) Complete Complete Complete

## 2024-03-09 NOTE — Plan of Care (Signed)

## 2024-03-09 NOTE — ED Notes (Signed)
 Pt stating need to have BM. Placed on bedpan, pt able to have very loose BM. Pt cleaned of stool, new chuck pad and brief placed. Pt repositioned in bed.

## 2024-03-09 NOTE — ED Notes (Signed)
 Pt denies any pain or other needs at this time. Suction container emptied, 1100 mL of urine.

## 2024-03-09 NOTE — Progress Notes (Signed)
  Echocardiogram 2D Echocardiogram has been performed. Definity  IV ultrasound imaging agent used on this study.  Thedora GORMAN Louder 03/09/2024, 10:25 AM

## 2024-03-09 NOTE — Progress Notes (Signed)
 PROGRESS NOTE    Kevin Gross  FMW:[id number] DOB: Apr 13, 1956 DOA: 03/08/2024 PCP: Autry Grayce LABOR, PA    Brief Narrative:  68 y.o. Caucasian male with medical history significant for alcoholic liver cirrhosis, CHF, stage III CKD, gout, anemia, hypertension, dyslipidemia, peripheral vascular disease, OSA on CPAP, who presented to the emergency room with acute onset of generalized weakness and fatigue with worsening lower extremity edema as well as dyspnea.  He admitted to dyspnea on exertion but denied any orthopnea or paroxysmal nocturnal dyspnea.  No cough or wheezing or hemoptysis.  No fever or chills.  He was seen by his NP and advised to come to the emergency room.  No melena or bright red bleeding per rectum or hematemesis or jaundice.  No nausea or vomiting or diarrhea.  No dysuria, oliguria or hematuria or flank pain.  No other bleeding diathesis.    Assessment & Plan:   Principal Problem:   ABLA (acute blood loss anemia) Active Problems:   Acute on chronic diastolic CHF (congestive heart failure) (HCC)   Essential hypertension   Dyslipidemia   Paroxysmal atrial fibrillation (HCC)  Acute on chronic anemia Iron  deficiency anemia Patient presented with hemoglobin of 5.2.  Received 2 units PRBC with improvement hemoglobin to 6.9.  Iron  indices indicate severe iron  deficiency anemia.  Patient has had extensive endoscopic workup in the past without clearly identifiable cause.  There is no clear evidence of acute blood loss or GI bleed at this time. Plan: Transfuse additional 1 unit PRBC Posttransfusion H&H IV Venofer  300 mg daily x 2 doses Hold oral iron  for now Continue IV PPI  Acute on chronic diastolic CHF (congestive heart failure) (HCC) Difficult to ascertain volume status.  Only mild elevation in BNP.  Received 3 doses of intravenous Lasix  since admission and is over 2 L net negative.  Will hold further Lasix  at this time.  Echocardiogram ordered on admission.  Will  follow.  Continue I's and O's, daily weights  Essential hypertension Reasonable BP control.  Continue current regimen   Paroxysmal atrial fibrillation (HCC) PTA atenolol  and amiodarone .  Not a candidate for anticoagulation   Dyslipidemia PTA statin and Zetia   Obesity Stage II.  BMI 38.45.  Complicates overall care prognosis.    DVT prophylaxis: SCDs Code Status: Full Family Communication: None Disposition Plan: Status is: Inpatient Remains inpatient appropriate because: Acute on chronic anemia of unclear etiology   Level of care: Progressive  Consultants:  None  Procedures:  None  Antimicrobials: None   Subjective: Seen and examined.  Seen in bed.  Reports symptomatic improvement since administration of PRBCs  Objective: Vitals:   03/09/24 1100 03/09/24 1218 03/09/24 1300 03/09/24 1424  BP: 128/60 (!) 133/49 (!) 106/53 (!) 117/44  Pulse: (!) 43 (!) 42 (!) 42 (!) 43  Resp: 15 14 11    Temp:  98.2 F (36.8 C)  98.3 F (36.8 C)  TempSrc:  Oral    SpO2: 100% 100% 100% 100%  Weight:      Height:        Intake/Output Summary (Last 24 hours) at 03/09/2024 1430 Last data filed at 03/09/2024 1215 Gross per 24 hour  Intake 329 ml  Output 3150 ml  Net -2821 ml   Filed Weights   03/08/24 1618  Weight: 121.6 kg    Examination:  General exam: Appears calm and comfortable  Respiratory system: Clear to auscultation. Respiratory effort normal. Cardiovascular system: S1-S2, RRR, no murmurs, no pedal edema Gastrointestinal system: Obese, soft,  NT/ND, normal bowel sounds Central nervous system: Alert and oriented. No focal neurological deficits. Extremities: Symmetric 5 x 5 power. Skin: No rashes, lesions or ulcers Psychiatry: Judgement and insight appear normal. Mood & affect appropriate.     Data Reviewed: I have personally reviewed following labs and imaging studies  CBC: Recent Labs  Lab 03/08/24 1620 03/09/24 0420 03/09/24 1223  WBC 5.3 4.5  --    HGB 5.2* 6.9* 7.9*  HCT 19.6* 24.1* 26.9*  MCV 80.3 80.3  --   PLT 233 208  --    Basic Metabolic Panel: Recent Labs  Lab 03/08/24 1620 03/09/24 0420  NA 141 143  K 4.7 4.1  CL 116* 112*  CO2 18* 23  GLUCOSE 88 80  BUN 52* 51*  CREATININE 2.73* 2.57*  CALCIUM  8.2* 8.2*   GFR: Estimated Creatinine Clearance: 37 mL/min (A) (by C-G formula based on SCr of 2.57 mg/dL (H)). Liver Function Tests: Recent Labs  Lab 03/08/24 1842  AST 57*  ALT 51*  ALKPHOS 67  BILITOT 0.5  PROT 4.2*  ALBUMIN  2.0*   Recent Labs  Lab 03/08/24 1842  LIPASE 81*   No results for input(s): AMMONIA in the last 168 hours. Coagulation Profile: No results for input(s): INR, PROTIME in the last 168 hours. Cardiac Enzymes: No results for input(s): CKTOTAL, CKMB, CKMBINDEX, TROPONINI in the last 168 hours. BNP (last 3 results) No results for input(s): PROBNP in the last 8760 hours. HbA1C: No results for input(s): HGBA1C in the last 72 hours. CBG: No results for input(s): GLUCAP in the last 168 hours. Lipid Profile: No results for input(s): CHOL, HDL, LDLCALC, TRIG, CHOLHDL, LDLDIRECT in the last 72 hours. Thyroid Function Tests: No results for input(s): TSH, T4TOTAL, FREET4, T3FREE, THYROIDAB in the last 72 hours. Anemia Panel: Recent Labs    03/09/24 0900  VITAMINB12 754  FOLATE 29.0  FERRITIN 12*  TIBC 388  IRON  22*   Sepsis Labs: No results for input(s): PROCALCITON, LATICACIDVEN in the last 168 hours.  No results found for this or any previous visit (from the past 240 hours).       Radiology Studies: DG Chest 2 View Result Date: 03/08/2024 CLINICAL DATA:  Shortness of breath.  Lower extremity edema. EXAM: CHEST - 2 VIEW COMPARISON:  October 19, 2022. FINDINGS: Stable cardiomediastinal silhouette. Both lungs are clear. The visualized skeletal structures are unremarkable. IMPRESSION: No active cardiopulmonary disease. Electronically  Signed   By: Lynwood Landy Raddle M.D.   On: 03/08/2024 17:51        Scheduled Meds:  allopurinol   100 mg Oral Daily   amiodarone   200 mg Oral BID   atenolol   25 mg Oral Daily   chlorthalidone   25 mg Oral Daily   cholecalciferol   2,000 Units Oral Daily   ezetimibe   10 mg Oral Daily   furosemide   20 mg Intravenous Q12H   lisinopril   40 mg Oral Daily   pantoprazole  (PROTONIX ) IV  40 mg Intravenous Q12H   simvastatin   20 mg Oral q1800   zolpidem   5 mg Oral QHS   Continuous Infusions:  sodium chloride  100 mL/hr at 03/09/24 1227   iron  sucrose       LOS: 1 day    Calvin KATHEE Robson, MD [facility]   If 7PM-7AM, please contact night-coverage  03/09/2024, 2:30 PM

## 2024-03-09 NOTE — ED Notes (Signed)
 Pt reports that he sleeps with CPAP at home. Dr Lawence, admitting provider, made aware and order requested

## 2024-03-09 NOTE — ED Notes (Signed)
 Pt stating needs to have a BM, placed on bedpan. Pt cleaned of stool, repositioned in bed. Lunch tray placed next to pt.

## 2024-03-10 DIAGNOSIS — D62 Acute posthemorrhagic anemia: Secondary | ICD-10-CM | POA: Diagnosis not present

## 2024-03-10 DIAGNOSIS — D649 Anemia, unspecified: Secondary | ICD-10-CM | POA: Diagnosis not present

## 2024-03-10 LAB — CBC WITH DIFFERENTIAL/PLATELET
Abs Immature Granulocytes: 0.02 K/uL (ref 0.00–0.07)
Basophils Absolute: 0 K/uL (ref 0.0–0.1)
Basophils Relative: 0 %
Eosinophils Absolute: 0.1 K/uL (ref 0.0–0.5)
Eosinophils Relative: 3 %
HCT: 24.2 % — ABNORMAL LOW (ref 39.0–52.0)
Hemoglobin: 7.2 g/dL — ABNORMAL LOW (ref 13.0–17.0)
Immature Granulocytes: 0 %
Lymphocytes Relative: 13 %
Lymphs Abs: 0.7 K/uL (ref 0.7–4.0)
MCH: 23.6 pg — ABNORMAL LOW (ref 26.0–34.0)
MCHC: 29.8 g/dL — ABNORMAL LOW (ref 30.0–36.0)
MCV: 79.3 fL — ABNORMAL LOW (ref 80.0–100.0)
Monocytes Absolute: 0.5 K/uL (ref 0.1–1.0)
Monocytes Relative: 9 %
Neutro Abs: 3.6 K/uL (ref 1.7–7.7)
Neutrophils Relative %: 75 %
Platelets: 205 K/uL (ref 150–400)
RBC: 3.05 MIL/uL — ABNORMAL LOW (ref 4.22–5.81)
RDW: 19.9 % — ABNORMAL HIGH (ref 11.5–15.5)
WBC: 4.9 K/uL (ref 4.0–10.5)
nRBC: 0 % (ref 0.0–0.2)

## 2024-03-10 LAB — BASIC METABOLIC PANEL WITH GFR
Anion gap: 6 (ref 5–15)
BUN: 49 mg/dL — ABNORMAL HIGH (ref 8–23)
CO2: 21 mmol/L — ABNORMAL LOW (ref 22–32)
Calcium: 7.7 mg/dL — ABNORMAL LOW (ref 8.9–10.3)
Chloride: 113 mmol/L — ABNORMAL HIGH (ref 98–111)
Creatinine, Ser: 2.62 mg/dL — ABNORMAL HIGH (ref 0.61–1.24)
GFR, Estimated: 26 mL/min — ABNORMAL LOW (ref >60)
Glucose, Bld: 77 mg/dL (ref 70–99)
Potassium: 3.9 mmol/L (ref 3.5–5.1)
Sodium: 140 mmol/L (ref 135–145)

## 2024-03-10 NOTE — Plan of Care (Signed)

## 2024-03-10 NOTE — Progress Notes (Signed)
 PROGRESS NOTE    Kevin Gross  FMW:[id number] DOB: 13-May-1956 DOA: 03/08/2024 PCP: Autry Grayce LABOR, PA    Brief Narrative:  68 y.o. Caucasian male with medical history significant for alcoholic liver cirrhosis, CHF, stage III CKD, gout, anemia, hypertension, dyslipidemia, peripheral vascular disease, OSA on CPAP, who presented to the emergency room with acute onset of generalized weakness and fatigue with worsening lower extremity edema as well as dyspnea.  He admitted to dyspnea on exertion but denied any orthopnea or paroxysmal nocturnal dyspnea.  No cough or wheezing or hemoptysis.  No fever or chills.  He was seen by his NP and advised to come to the emergency room.  No melena or bright red bleeding per rectum or hematemesis or jaundice.  No nausea or vomiting or diarrhea.  No dysuria, oliguria or hematuria or flank pain.  No other bleeding diathesis.    Assessment & Plan:   Principal Problem:   ABLA (acute blood loss anemia) Active Problems:   Acute on chronic diastolic CHF (congestive heart failure) (HCC)   Essential hypertension   Dyslipidemia   Paroxysmal atrial fibrillation (HCC)  Acute on chronic anemia Iron  deficiency anemia Patient presented with hemoglobin of 5.2.  Received 2 units PRBC with improvement hemoglobin to 6.9.  Iron  indices indicate severe iron  deficiency anemia.  Patient has had extensive endoscopic workup in the past without clearly identifiable cause.  There is no clear evidence of acute blood loss or GI bleed at this time. Hemoglobin 7.2 on 7/13 Plan: IV Venofer  300 mg x 1 (first dose received 7/12) Hold oral iron  for now Continue IV PPI Recheck hemoglobin in a.m. If stable anticipate transition to oral iron  at discharge 7/14  Acute on chronic diastolic CHF (congestive heart failure) (HCC) Difficult to ascertain volume status.  Only mild elevation in BNP.  Received 3 doses of intravenous Lasix  since admission and is over 2 L net negative.  Will hold  further Lasix  at this time.  Echocardiogram reassuring.  Grade 1 diastolic dysfunction.  Essential hypertension Reasonable BP control.  Continue current regimen   Paroxysmal atrial fibrillation (HCC) PTA atenolol  and amiodarone .  Not a candidate for anticoagulation   Dyslipidemia PTA statin and Zetia   Obesity Stage II.  BMI 38.45.  Complicates overall care prognosis.    DVT prophylaxis: SCDs Code Status: Full Family Communication: None Disposition Plan: Status is: Inpatient Remains inpatient appropriate because: Acute on chronic anemia of unclear etiology   Level of care: Progressive  Consultants:  None  Procedures:  None  Antimicrobials: None   Subjective: Seen and examined.  Resting in bed.  Reports improvement in energy level since admission.  Hemoglobin approaching baseline.  Objective: Vitals:   03/10/24 0609 03/10/24 0827 03/10/24 0827 03/10/24 1146  BP: (!) 109/43 (!) 114/50 (!) 114/50 (!) 107/49  Pulse: (!) 44 (!) 42 (!) 42 (!) 40  Resp: 18     Temp: 98.7 F (37.1 C) 98.1 F (36.7 C) 98.2 F (36.8 C) 98.2 F (36.8 C)  TempSrc: Oral Oral Oral   SpO2: 96%  96% 95%  Weight:      Height:        Intake/Output Summary (Last 24 hours) at 03/10/2024 1159 Last data filed at 03/10/2024 0600 Gross per 24 hour  Intake 2162.2 ml  Output 1450 ml  Net 712.2 ml   Filed Weights   03/08/24 1618 03/10/24 0500  Weight: 121.6 kg 118.4 kg    Examination:  General exam: NAD Respiratory system: Lungs clear.  Normal work of breathing.  Room air Cardiovascular system: S1-S2, RRR, no murmurs, no pedal edema Gastrointestinal system: Obese, soft, NT/ND, normal bowel sounds Central nervous system: Alert and oriented. No focal neurological deficits. Extremities: Symmetric 5 x 5 power. Skin: No rashes, lesions or ulcers Psychiatry: Judgement and insight appear normal. Mood & affect appropriate.     Data Reviewed: I have personally reviewed following labs and  imaging studies  CBC: Recent Labs  Lab 03/08/24 1620 03/09/24 0420 03/09/24 1223 03/10/24 0310  WBC 5.3 4.5  --  4.9  NEUTROABS  --   --   --  3.6  HGB 5.2* 6.9* 7.9* 7.2*  HCT 19.6* 24.1* 26.9* 24.2*  MCV 80.3 80.3  --  79.3*  PLT 233 208  --  205   Basic Metabolic Panel: Recent Labs  Lab 03/08/24 1620 03/09/24 0420 03/10/24 0310  NA 141 143 140  K 4.7 4.1 3.9  CL 116* 112* 113*  CO2 18* 23 21*  GLUCOSE 88 80 77  BUN 52* 51* 49*  CREATININE 2.73* 2.57* 2.62*  CALCIUM  8.2* 8.2* 7.7*   GFR: Estimated Creatinine Clearance: 35.8 mL/min (A) (by C-G formula based on SCr of 2.62 mg/dL (H)). Liver Function Tests: Recent Labs  Lab 03/08/24 1842  AST 57*  ALT 51*  ALKPHOS 67  BILITOT 0.5  PROT 4.2*  ALBUMIN  2.0*   Recent Labs  Lab 03/08/24 1842  LIPASE 81*   No results for input(s): AMMONIA in the last 168 hours. Coagulation Profile: No results for input(s): INR, PROTIME in the last 168 hours. Cardiac Enzymes: No results for input(s): CKTOTAL, CKMB, CKMBINDEX, TROPONINI in the last 168 hours. BNP (last 3 results) No results for input(s): PROBNP in the last 8760 hours. HbA1C: No results for input(s): HGBA1C in the last 72 hours. CBG: No results for input(s): GLUCAP in the last 168 hours. Lipid Profile: No results for input(s): CHOL, HDL, LDLCALC, TRIG, CHOLHDL, LDLDIRECT in the last 72 hours. Thyroid Function Tests: No results for input(s): TSH, T4TOTAL, FREET4, T3FREE, THYROIDAB in the last 72 hours. Anemia Panel: Recent Labs    03/09/24 0900  VITAMINB12 754  FOLATE 29.0  FERRITIN 12*  TIBC 388  IRON  22*   Sepsis Labs: No results for input(s): PROCALCITON, LATICACIDVEN in the last 168 hours.  No results found for this or any previous visit (from the past 240 hours).       Radiology Studies: ECHOCARDIOGRAM COMPLETE Result Date: 03/09/2024    ECHOCARDIOGRAM REPORT   Patient Name:   EDITH LORD Sinning  Date of Exam: 03/09/2024 Medical Rec #:  [id number]       Height:       70.0 in Accession #:    [id number]      Weight:       268.0 lb Date of Birth:  Apr 13, 1956       BSA:          2.364 m Patient Age:    68 years        BP:           129/56 mmHg Patient Gender: M               HR:           44 bpm. Exam Location:  [facility] Procedure: 2D Echo, Cardiac Doppler, Color Doppler and Intracardiac            Opacification Agent (Both Spectral and Color Flow Doppler were  utilized during procedure). Indications:     CHF I50.31  History:         Patient has prior history of Echocardiogram examinations, most                  recent 11/08/2021.  Sonographer:     Thedora Louder RDCS, FASE Referring Phys:  [id number] [place] A MANSY Diagnosing Phys: Cara JONETTA Lovelace MD  Sonographer Comments: Technically difficult study due to poor echo windows, suboptimal parasternal window, suboptimal apical window, suboptimal subcostal window and patient is obese. Image acquisition challenging due to patient body habitus and Image acquisition challenging due to respiratory motion. IMPRESSIONS  1. Left ventricular ejection fraction, by estimation, is 60 to 65%. The left ventricle has normal function. The left ventricle has no regional wall motion abnormalities. The left ventricular internal cavity size was moderately to severely dilated. There  is mild left ventricular hypertrophy. Left ventricular diastolic parameters are consistent with Grade II diastolic dysfunction (pseudonormalization).  2. Right ventricular systolic function is normal. The right ventricular size is normal.  3. Left atrial size was mildly dilated.  4. The mitral valve is normal in structure. No evidence of mitral valve regurgitation.  5. The aortic valve is normal in structure. Aortic valve regurgitation is not visualized.  6. Aortic dilatation noted. There is mild dilatation of the aortic root, measuring 40 mm. FINDINGS  Left Ventricle: Left ventricular ejection fraction,  by estimation, is 60 to 65%. The left ventricle has normal function. The left ventricle has no regional wall motion abnormalities. Strain was performed and the global longitudinal strain is indeterminate. The left ventricular internal cavity size was moderately to severely dilated. There is mild left ventricular hypertrophy. Left ventricular diastolic parameters are consistent with Grade II diastolic dysfunction (pseudonormalization). Right Ventricle: The right ventricular size is normal. No increase in right ventricular wall thickness. Right ventricular systolic function is normal. Left Atrium: Left atrial size was mildly dilated. Right Atrium: Right atrial size was normal in size. Pericardium: There is no evidence of pericardial effusion. Mitral Valve: The mitral valve is normal in structure. No evidence of mitral valve regurgitation. Tricuspid Valve: The tricuspid valve is normal in structure. Tricuspid valve regurgitation is trivial. Aortic Valve: The aortic valve is normal in structure. Aortic valve regurgitation is not visualized. Aortic valve peak gradient measures 12.0 mmHg. Pulmonic Valve: The pulmonic valve was normal in structure. Pulmonic valve regurgitation is not visualized. Aorta: The ascending aorta was not well visualized and aortic dilatation noted. There is mild dilatation of the aortic root, measuring 40 mm. IAS/Shunts: No atrial level shunt detected by color flow Doppler. Additional Comments: 3D was performed not requiring image post processing on an independent workstation and was indeterminate.  LEFT VENTRICLE PLAX 2D LVIDd:         5.90 cm   Diastology LVIDs:         4.00 cm   LV e' medial:    7.40 cm/s LV PW:         1.10 cm   LV E/e' medial:  10.5 LV IVS:        1.20 cm   LV e' lateral:   11.20 cm/s LVOT diam:     2.10 cm   LV E/e' lateral: 6.9 LV SV:         77 LV SV Index:   33 LVOT Area:     3.46 cm  RIGHT VENTRICLE RV Basal diam:  3.90 cm RV S prime:  16.80 cm/s TAPSE (M-mode): 2.4  cm LEFT ATRIUM           Index        RIGHT ATRIUM           Index LA diam:      4.30 cm 1.82 cm/m   RA Area:     13.50 cm LA Vol (A4C): 72.9 ml 30.84 ml/m  RA Volume:   33.10 ml  14.00 ml/m  AORTIC VALVE                 PULMONIC VALVE AV Area (Vmax): 1.75 cm     PV Vmax:        1.14 m/s AV Vmax:        173.00 cm/s  PV Peak grad:   5.2 mmHg AV Peak Grad:   12.0 mmHg    RVOT Peak grad: 5 mmHg LVOT Vmax:      87.60 cm/s LVOT Vmean:     60.000 cm/s LVOT VTI:       0.223 m  AORTA Ao Root diam: 4.30 cm Ao Asc diam:  3.70 cm MITRAL VALVE MV Area (PHT): 2.11 cm    SHUNTS MV Decel Time: 359 msec    Systemic VTI:  0.22 m MV E velocity: 77.40 cm/s  Systemic Diam: 2.10 cm MV A velocity: 71.00 cm/s MV E/A ratio:  1.09 Dwayne D Callwood MD Electronically signed by Cara JONETTA Lovelace MD Signature Date/Time: 03/09/2024/3:56:13 PM    Final    DG Chest 2 View Result Date: 03/08/2024 CLINICAL DATA:  Shortness of breath.  Lower extremity edema. EXAM: CHEST - 2 VIEW COMPARISON:  October 19, 2022. FINDINGS: Stable cardiomediastinal silhouette. Both lungs are clear. The visualized skeletal structures are unremarkable. IMPRESSION: No active cardiopulmonary disease. Electronically Signed   By: Lynwood Landy Raddle M.D.   On: 03/08/2024 17:51        Scheduled Meds:  allopurinol   100 mg Oral Daily   amiodarone   200 mg Oral BID   chlorthalidone   25 mg Oral Daily   cholecalciferol   2,000 Units Oral Daily   ezetimibe   10 mg Oral Daily   lisinopril   40 mg Oral Daily   pantoprazole  (PROTONIX ) IV  40 mg Intravenous Q12H   simvastatin   20 mg Oral q1800   zolpidem   5 mg Oral QHS   Continuous Infusions:  iron  sucrose 300 mg (03/10/24 1146)     LOS: 2 days    Calvin KATHEE Robson, MD [facility]   If 7PM-7AM, please contact night-coverage  03/10/2024, 11:59 AM

## 2024-03-10 NOTE — Plan of Care (Signed)
  Problem: Clinical Measurements: Goal: Will remain free from infection Outcome: Progressing Goal: Respiratory complications will improve Outcome: Progressing   Problem: Activity: Goal: Risk for activity intolerance will decrease Outcome: Progressing   Problem: Nutrition: Goal: Adequate nutrition will be maintained Outcome: Progressing   

## 2024-03-10 NOTE — Care Management Important Message (Signed)
 Important Message  Patient Details  Name: Kevin Gross MRN: [id number] Date of Birth: [birth year]   Important Message Given:  Yes - Medicare IM     [name] 03/10/2024, 2:39 PM

## 2024-03-11 DIAGNOSIS — D649 Anemia, unspecified: Secondary | ICD-10-CM | POA: Diagnosis not present

## 2024-03-11 DIAGNOSIS — D62 Acute posthemorrhagic anemia: Secondary | ICD-10-CM | POA: Diagnosis not present

## 2024-03-11 LAB — PREPARE RBC (CROSSMATCH)

## 2024-03-11 MED ORDER — POLYSACCHARIDE IRON COMPLEX 150 MG PO CAPS
150.0000 mg | ORAL_CAPSULE | Freq: Two times a day (BID) | ORAL | Status: DC
  Administered 2024-03-11: 150 mg via ORAL
  Filled 2024-03-11: qty 1

## 2024-03-11 MED ORDER — POLYSACCHARIDE IRON COMPLEX 150 MG PO CAPS: 150.0000 mg | ORAL_CAPSULE | Freq: Two times a day (BID) | ORAL | 0 refills | Status: AC

## 2024-03-11 MED ORDER — SODIUM CHLORIDE 0.9% IV SOLUTION: Freq: Once | INTRAVENOUS | Status: AC

## 2024-03-11 NOTE — Discharge Summary (Signed)
 Physician Discharge Summary  DEAGO BURRUSS FMW:[id number] DOB: Nov 30, 1955 DOA: 03/08/2024  PCP: Autry Grayce LABOR, PA  Admit date: 03/08/2024 Discharge date: 03/11/2024  Admitted From: Home Disposition:  Home  Recommendations for Outpatient Follow-up:  Follow up with PCP in 1-2 weeks Follow up with hematology as directed  Home Health:No  Equipment/Devices:None   Discharge Condition:Stable  CODE STATUS:Full  Diet recommendation: Reg  Brief/Interim Summary:  68 y.o. Caucasian male with medical history significant for alcoholic liver cirrhosis, CHF, stage III CKD, gout, anemia, hypertension, dyslipidemia, peripheral vascular disease, OSA on CPAP, who presented to the emergency room with acute onset of generalized weakness and fatigue with worsening lower extremity edema as well as dyspnea.  He admitted to dyspnea on exertion but denied any orthopnea or paroxysmal nocturnal dyspnea.  No cough or wheezing or hemoptysis.  No fever or chills.  He was seen by his NP and advised to come to the emergency room.  No melena or bright red bleeding per rectum or hematemesis or jaundice.  No nausea or vomiting or diarrhea.  No dysuria, oliguria or hematuria or flank pain.  No other bleeding diathesis.   No bleeding noted while patient was in house.  Severe iron  deficiency noted.  2 doses of IV iron  administered while in house.  Defer remainder of IV iron  infusion as outpatient.  Patient's hemoglobin on day of discharge 7.2.  This appears to be approximately at his baseline.  Patient is feeling back to baseline.  Will transfuse additional unit PRBC in preparation for discharge.  Patient has an established follow-up with the cancer center 2 days postdischarge.  Can recheck labs at that time.    Discharge Diagnoses:  Principal Problem:   ABLA (acute blood loss anemia) Active Problems:   Acute on chronic diastolic CHF (congestive heart failure) (HCC)   Essential hypertension   Dyslipidemia   Paroxysmal  atrial fibrillation (HCC)    Acute on chronic anemia Iron  deficiency anemia Patient presented with hemoglobin of 5.2.  Received 2 units PRBC with improvement hemoglobin to 6.9.  Iron  indices indicate severe iron  deficiency anemia.  Patient has had extensive endoscopic workup in the past without clearly identifiable cause.  There is no clear evidence of acute blood loss or GI bleed at this time. Hemoglobin 7.2 on 7/13 Plan: Received 2 doses of IV Venofer  300 mg.  At time of discharge will increase oral iron  to twice daily dosing.  Continue p.o. PPI.  Follow-up outpatient PCP and hematology as discharge.  Endoscopic evaluation deferred during hospitalization.  As no evidence of GI bleed was noted.  Patient having bowel movements.  Brown without evidence of melena or hematochezia.  No hematemesis.  Stool occult negative.  Discharge Instructions  Discharge Instructions     Diet - low sodium heart healthy   Complete by: As directed    Increase activity slowly   Complete by: As directed       Allergies as of 03/11/2024       Reactions   Jardiance [empagliflozin]    Loss of balance, hypoglycemic         Medication List     STOP taking these medications    albuterol  108 (90 Base) MCG/ACT inhaler Commonly known as: VENTOLIN  HFA   cyanocobalamin  500 MCG tablet Commonly known as: VITAMIN B12   Niferex Tabs       TAKE these medications    allopurinol  100 MG tablet Commonly known as: ZYLOPRIM  Take 100 mg by mouth daily.  allopurinol  300 MG tablet Commonly known as: ZYLOPRIM  Take 600 mg by mouth daily.   amiodarone  200 MG tablet Commonly known as: PACERONE  Take 200 mg by mouth 2 (two) times daily.   aspirin  EC 81 MG tablet Take 81 mg by mouth every morning.   atenolol  25 MG tablet Commonly known as: TENORMIN  Take 25 mg by mouth daily.   chlorthalidone  25 MG tablet Commonly known as: HYGROTON  Take 25 mg by mouth daily.   clopidogrel  75 MG tablet Commonly  known as: PLAVIX  Take 1 tablet (75 mg total) by mouth daily.   ezetimibe  10 MG tablet Commonly known as: ZETIA  Take 1 tablet (10 mg total) by mouth daily.   iron  polysaccharides 150 MG capsule Commonly known as: NIFEREX Take 1 capsule (150 mg total) by mouth 2 (two) times daily. What changed: when to take this   lisinopril  40 MG tablet Commonly known as: ZESTRIL  Take 40 mg by mouth daily.   simvastatin  20 MG tablet Commonly known as: ZOCOR  Take 20 mg by mouth in the morning.   triamcinolone  ointment 0.1 % Commonly known as: KENALOG  Apply 1 Application topically 2 (two) times daily as needed (psoriasis).   Vitamin D3 25 MCG (1000 UT) Caps Take 2,000 Units by mouth daily.   zolpidem  10 MG tablet Commonly known as: AMBIEN  Take 10 mg by mouth at bedtime.        Allergies  Allergen Reactions   Jardiance [Empagliflozin]     Loss of balance, hypoglycemic     Consultations: None   Procedures/Studies: ECHOCARDIOGRAM COMPLETE Result Date: 03/09/2024    ECHOCARDIOGRAM REPORT   Patient Name:   Kevin Gross Date of Exam: 03/09/2024 Medical Rec #:  [id number]       Height:       70.0 in Accession #:    [id number]      Weight:       268.0 lb Date of Birth:  July 23, 1956       BSA:          2.364 m Patient Age:    68 years        BP:           129/56 mmHg Patient Gender: M               HR:           44 bpm. Exam Location:  [facility] Procedure: 2D Echo, Cardiac Doppler, Color Doppler and Intracardiac            Opacification Agent (Both Spectral and Color Flow Doppler were            utilized during procedure). Indications:     CHF I50.31  History:         Patient has prior history of Echocardiogram examinations, most                  recent 11/08/2021.  Sonographer:     Thedora Louder RDCS, FASE Referring Phys:  [id number] MADISON A MANSY Diagnosing Phys: Cara JONETTA Lovelace MD  Sonographer Comments: Technically difficult study due to poor echo windows, suboptimal parasternal window, suboptimal  apical window, suboptimal subcostal window and patient is obese. Image acquisition challenging due to patient body habitus and Image acquisition challenging due to respiratory motion. IMPRESSIONS  1. Left ventricular ejection fraction, by estimation, is 60 to 65%. The left ventricle has normal function. The left ventricle has no regional wall motion abnormalities. The left ventricular internal cavity size was moderately  to severely dilated. There  is mild left ventricular hypertrophy. Left ventricular diastolic parameters are consistent with Grade II diastolic dysfunction (pseudonormalization).  2. Right ventricular systolic function is normal. The right ventricular size is normal.  3. Left atrial size was mildly dilated.  4. The mitral valve is normal in structure. No evidence of mitral valve regurgitation.  5. The aortic valve is normal in structure. Aortic valve regurgitation is not visualized.  6. Aortic dilatation noted. There is mild dilatation of the aortic root, measuring 40 mm. FINDINGS  Left Ventricle: Left ventricular ejection fraction, by estimation, is 60 to 65%. The left ventricle has normal function. The left ventricle has no regional wall motion abnormalities. Strain was performed and the global longitudinal strain is indeterminate. The left ventricular internal cavity size was moderately to severely dilated. There is mild left ventricular hypertrophy. Left ventricular diastolic parameters are consistent with Grade II diastolic dysfunction (pseudonormalization). Right Ventricle: The right ventricular size is normal. No increase in right ventricular wall thickness. Right ventricular systolic function is normal. Left Atrium: Left atrial size was mildly dilated. Right Atrium: Right atrial size was normal in size. Pericardium: There is no evidence of pericardial effusion. Mitral Valve: The mitral valve is normal in structure. No evidence of mitral valve regurgitation. Tricuspid Valve: The tricuspid valve  is normal in structure. Tricuspid valve regurgitation is trivial. Aortic Valve: The aortic valve is normal in structure. Aortic valve regurgitation is not visualized. Aortic valve peak gradient measures 12.0 mmHg. Pulmonic Valve: The pulmonic valve was normal in structure. Pulmonic valve regurgitation is not visualized. Aorta: The ascending aorta was not well visualized and aortic dilatation noted. There is mild dilatation of the aortic root, measuring 40 mm. IAS/Shunts: No atrial level shunt detected by color flow Doppler. Additional Comments: 3D was performed not requiring image post processing on an independent workstation and was indeterminate.  LEFT VENTRICLE PLAX 2D LVIDd:         5.90 cm   Diastology LVIDs:         4.00 cm   LV e' medial:    7.40 cm/s LV PW:         1.10 cm   LV E/e' medial:  10.5 LV IVS:        1.20 cm   LV e' lateral:   11.20 cm/s LVOT diam:     2.10 cm   LV E/e' lateral: 6.9 LV SV:         77 LV SV Index:   33 LVOT Area:     3.46 cm  RIGHT VENTRICLE RV Basal diam:  3.90 cm RV S prime:     16.80 cm/s TAPSE (M-mode): 2.4 cm LEFT ATRIUM           Index        RIGHT ATRIUM           Index LA diam:      4.30 cm 1.82 cm/m   RA Area:     13.50 cm LA Vol (A4C): 72.9 ml 30.84 ml/m  RA Volume:   33.10 ml  14.00 ml/m  AORTIC VALVE                 PULMONIC VALVE AV Area (Vmax): 1.75 cm     PV Vmax:        1.14 m/s AV Vmax:        173.00 cm/s  PV Peak grad:   5.2 mmHg AV Peak Grad:   12.0 mmHg  RVOT Peak grad: 5 mmHg LVOT Vmax:      87.60 cm/s LVOT Vmean:     60.000 cm/s LVOT VTI:       0.223 m  AORTA Ao Root diam: 4.30 cm Ao Asc diam:  3.70 cm MITRAL VALVE MV Area (PHT): 2.11 cm    SHUNTS MV Decel Time: 359 msec    Systemic VTI:  0.22 m MV E velocity: 77.40 cm/s  Systemic Diam: 2.10 cm MV A velocity: 71.00 cm/s MV E/A ratio:  1.09 Dwayne JONETTA Lovelace MD Electronically signed by Cara JONETTA Lovelace MD Signature Date/Time: 03/09/2024/3:56:13 PM    Final    DG Chest 2 View Result Date:  03/08/2024 CLINICAL DATA:  Shortness of breath.  Lower extremity edema. EXAM: CHEST - 2 VIEW COMPARISON:  October 19, 2022. FINDINGS: Stable cardiomediastinal silhouette. Both lungs are clear. The visualized skeletal structures are unremarkable. IMPRESSION: No active cardiopulmonary disease. Electronically Signed   By: Lynwood Landy Raddle M.D.   On: 03/08/2024 17:51      Subjective: Seen and examined on the day of discharge.  Stable no distress.  Appropriate discharge home.  Discharge Exam: Vitals:   03/11/24 1125 03/11/24 1357  BP: (!) 96/45 (!) 118/50  Pulse: (!) 33 (!) 40  Resp:  18  Temp: 98.4 F (36.9 C) 98.4 F (36.9 C)  SpO2: 98% 99%   Vitals:   03/11/24 1046 03/11/24 1123 03/11/24 1125 03/11/24 1357  BP: (!) 96/45 (!) 96/45 (!) 96/45 (!) 118/50  Pulse: (!) 38 (!) 40 (!) 33 (!) 40  Resp: 17 17  18   Temp: 98.3 F (36.8 C) 98.4 F (36.9 C) 98.4 F (36.9 C) 98.4 F (36.9 C)  TempSrc: Oral Oral Oral Oral  SpO2: 98% 98% 98% 99%  Weight:      Height:        General: Pt is alert, awake, not in acute distress Cardiovascular: RRR, S1/S2 +, no rubs, no gallops Respiratory: CTA bilaterally, no wheezing, no rhonchi Abdominal: Soft, NT, ND, bowel sounds + Extremities: no edema, no cyanosis    The results of significant diagnostics from this hospitalization (including imaging, microbiology, ancillary and laboratory) are listed below for reference.     Microbiology: No results found for this or any previous visit (from the past 240 hours).   Labs: BNP (last 3 results) Recent Labs    03/08/24 1620  BNP 213.8*   Basic Metabolic Panel: Recent Labs  Lab 03/08/24 1620 03/09/24 0420 03/10/24 0310  NA 141 143 140  K 4.7 4.1 3.9  CL 116* 112* 113*  CO2 18* 23 21*  GLUCOSE 88 80 77  BUN 52* 51* 49*  CREATININE 2.73* 2.57* 2.62*  CALCIUM  8.2* 8.2* 7.7*   Liver Function Tests: Recent Labs  Lab 03/08/24 1842  AST 57*  ALT 51*  ALKPHOS 67  BILITOT 0.5  PROT 4.2*   ALBUMIN  2.0*   Recent Labs  Lab 03/08/24 1842  LIPASE 81*   No results for input(s): AMMONIA in the last 168 hours. CBC: Recent Labs  Lab 03/08/24 1620 03/09/24 0420 03/09/24 1223 03/10/24 0310  WBC 5.3 4.5  --  4.9  NEUTROABS  --   --   --  3.6  HGB 5.2* 6.9* 7.9* 7.2*  HCT 19.6* 24.1* 26.9* 24.2*  MCV 80.3 80.3  --  79.3*  PLT 233 208  --  205   Cardiac Enzymes: No results for input(s): CKTOTAL, CKMB, CKMBINDEX, TROPONINI in the last 168 hours. BNP:  Invalid input(s): POCBNP CBG: No results for input(s): GLUCAP in the last 168 hours. D-Dimer No results for input(s): DDIMER in the last 72 hours. Hgb A1c No results for input(s): HGBA1C in the last 72 hours. Lipid Profile No results for input(s): CHOL, HDL, LDLCALC, TRIG, CHOLHDL, LDLDIRECT in the last 72 hours. Thyroid function studies No results for input(s): TSH, T4TOTAL, T3FREE, THYROIDAB in the last 72 hours.  Invalid input(s): FREET3 Anemia work up Recent Labs    03/09/24 0900  VITAMINB12 754  FOLATE 29.0  FERRITIN 12*  TIBC 388  IRON  22*   Urinalysis    Component Value Date/Time   COLORURINE STRAW (A) 03/08/2024 1842   APPEARANCEUR CLEAR (A) 03/08/2024 1842   LABSPEC 1.010 03/08/2024 1842   PHURINE 5.0 03/08/2024 1842   GLUCOSEU NEGATIVE 03/08/2024 1842   HGBUR NEGATIVE 03/08/2024 1842   BILIRUBINUR NEGATIVE 03/08/2024 1842   KETONESUR NEGATIVE 03/08/2024 1842   PROTEINUR NEGATIVE 03/08/2024 1842   NITRITE NEGATIVE 03/08/2024 1842   LEUKOCYTESUR NEGATIVE 03/08/2024 1842   Sepsis Labs Recent Labs  Lab 03/08/24 1620 03/09/24 0420 03/10/24 0310  WBC 5.3 4.5 4.9   Microbiology No results found for this or any previous visit (from the past 240 hours).   Time coordinating discharge: 40 minutes  SIGNED:   Calvin KATHEE Robson, MD  [facility] 03/11/2024, 5:34 PM Pager   If 7PM-7AM, please contact night-coverage

## 2024-03-11 NOTE — Evaluation (Signed)
 Occupational Therapy Evaluation Patient Details Name: Kevin Gross MRN: [id number] DOB: 06-11-1956 Today's Date: 03/11/2024   History of Present Illness   Pt is a 68 y.o. male presenting to hospital 03/08/24 with c/o leg swelling and SOB.  Pt admitted with ABLA and acute on chronic diastolic CHF.  PMH includes CHF, CKD, COPD, alcoholic cirrhosis of liver, anemia, CAD on Plavix , paroxysmal a-fib not on anticoagulation, PVD, OSA on CPAP.   Clinical Impressions Pt was seen for OT evaluation this date. Prior to hospital admission, pt was independent and primary care partner completing housekeeping and meal prep duties. Pt lives with his spouse, daughter, and daughter's boyfriend. Pt presents to acute OT demonstrating mild impaired ADL/mobility performance 2/2 decreased activity tolerance (See OT problem list for additional functional deficits). Pt currently requires increased time/effort to complete more exertional activities.  Pt educated in ECS including activity pacing, work simplification, falls prevention, AE/DME, and home/routines modifications to maximize safety/indep with ADL, IADL, and mobility. Handout provided. Pt verbalized understanding and appreciative of instruction. Do not anticipate the need for follow up OT services upon acute hospital DC. Will discharge in house.    If plan is discharge home, recommend the following:         Functional Status Assessment   Patient has had a recent decline in their functional status and demonstrates the ability to make significant improvements in function in a reasonable and predictable amount of time.     Equipment Recommendations   None recommended by OT     Recommendations for Other Services         Precautions/Restrictions   Precautions Precautions: None Recall of Precautions/Restrictions: Intact Restrictions Weight Bearing Restrictions Per Provider Order: No     Mobility Bed Mobility               General bed  mobility comments: deferred, nursing preparing for blood transfusion    Transfers             ADL either performed or assessed with clinical judgement   ADL Overall ADL's : Modified independent    General ADL Comments: Anticipate pt mod indep with ADL, pt denies any concerns.      Pertinent Vitals/Pain Pain Assessment Pain Assessment: No/denies pain     Extremity/Trunk Assessment Upper Extremity Assessment Upper Extremity Assessment: Overall WFL for tasks assessed   Lower Extremity Assessment Lower Extremity Assessment: Generalized weakness   Cervical / Trunk Assessment Cervical / Trunk Assessment: Normal   Communication Communication Communication: No apparent difficulties   Cognition Arousal: Alert Behavior During Therapy: WFL for tasks assessed/performed Cognition: No apparent impairments      Following commands: Intact          Exercises Other Exercises Other Exercises: Pt educated in ECS including activity pacing, work simplification, falls prevention, AE/DME, and home/routines modifications to maximize safety/indep with ADL, IADL, and mobility. Handout provided.   Shoulder Instructions      Home Living Family/patient expects to be discharged to:: Private residence Living Arrangements: Spouse/significant other;Children;Other relatives (pt's wife, pt's daughter (and daughter's boyfriend)) Available Help at Discharge: Family Type of Home: House Home Access: Stairs to enter [employer] of Steps: 3-4 Entrance Stairs-Rails: Right;Left;Can reach both Home Layout: One level     Bathroom Shower/Tub: Tub/shower unit;Walk-in shower   Bathroom Toilet: Handicapped height     Home Equipment: Cane - single point;Rollator (4 wheels);Rolling Walker (2 wheels);BSC/3in1 (Has hurry-cane)   Additional Comments: Pt is caregiver for his wife (does not have to  provide physical assist for pt with functional mobility); assists with emptying BSC, cooking,  cleaning, shopping      Prior Functioning/Environment Prior Level of Function : Independent/Modified Independent;Driving             Mobility Comments: Independent with ambulation shorter distances (uses electric scooter for grocery shopping); will use cane occasionally if needed to perform a lot of exertion      OT Problem List: Decreased activity tolerance        OT Goals(Current goals can be found in the care plan section)   Acute Rehab OT Goals Patient Stated Goal: go home OT Goal Formulation: All assessment and education complete, DC therapy   AM-PAC OT 6 Clicks Daily Activity     Outcome Measure Help from another person eating meals?: None Help from another person taking care of personal grooming?: None Help from another person toileting, which includes using toliet, bedpan, or urinal?: None Help from another person bathing (including washing, rinsing, drying)?: None Help from another person to put on and taking off regular upper body clothing?: None Help from another person to put on and taking off regular lower body clothing?: None 6 Click Score: 24   End of Session    Activity Tolerance: Patient tolerated treatment well Patient left: in bed;with call bell/phone within reach;with nursing/sitter in room  OT Visit Diagnosis: Other abnormalities of gait and mobility (R26.89)                Time: 8947-8892 OT Time Calculation (min): 15 min Charges:  OT General Charges $OT Visit: 1 Visit OT Evaluation $OT Eval Low Complexity: 1 Low OT Treatments $Self Care/Home Management : 8-22 mins  Warren SAUNDERS., MPH, MS, OTR/L ascom [phone] 03/11/24, 11:16 AM

## 2024-03-11 NOTE — Evaluation (Signed)
 Physical Therapy Evaluation Patient Details Name: Kevin Gross MRN: [id number] DOB: November 29, 1955 Today's Date: 03/11/2024  History of Present Illness  Pt is a 68 y.o. male presenting to hospital 03/08/24 with c/o leg swelling and SOB.  Pt admitted with ABLA and acute on chronic diastolic CHF.  PMH includes CHF, CKD, COPD, alcoholic cirrhosis of liver, anemia, CAD on Plavix , paroxysmal a-fib not on anticoagulation, PVD, OSA on CPAP.  Clinical Impression  Prior to recent medical concerns, pt reports being independent with functional mobility (does not walk long distances; uses electric scooter at grocery store; will use cane if activity requiring a lot of exertion); lives with his wife (pt takes care of his wife but does not need to provide any physical assistance with functional mobility); lives in 1 level home with 3-4 STE B railings.  No c/o pain during session.  Currently pt is modified independent with bed mobility; independent with transfers; and CGA to ambulate 80 feet (no AD use).  No loss of balance or SOB noted during sessions activities.  Pt's HR 42-49 bpm during session (pt reports chronically low HR in 40's)--nurse and MD notified.  Limited activity d/t pt pending blood transfusion (Hgb of 7.2) but pt appearing to be close to functional baseline per pt description (pt also reports he has been walking to the bathroom on his own for toileting needs).  Pt would currently benefit from skilled PT to address noted impairments and functional limitations during hospitalization (see below for any additional details).  MD, pt's nurse, and TOC updated on pt's status and PT recommendations.    If plan is discharge home, recommend the following: Assist for transportation   Can travel by private vehicle        Equipment Recommendations None recommended by PT  Recommendations for Other Services       Functional Status Assessment Patient has had a recent decline in their functional status and  demonstrates the ability to make significant improvements in function in a reasonable and predictable amount of time.     Precautions / Restrictions Precautions Precautions: None Recall of Precautions/Restrictions: Intact Restrictions Weight Bearing Restrictions Per Provider Order: No      Mobility  Bed Mobility Overal bed mobility: Modified Independent             General bed mobility comments: HOB elevated; no difficulties noted semi-supine to/from sitting    Transfers Overall transfer level: Independent Equipment used: None               General transfer comment: steady transfer from bed    Ambulation/Gait Ambulation/Gait assistance: Contact guard assist Gait Distance (Feet): 80 Feet Assistive device: None Gait Pattern/deviations: Step-through pattern Gait velocity: mildly decreased     General Gait Details: mild increased B lateral sway but steady  Stairs            Wheelchair Mobility     Tilt Bed    Modified Rankin (Stroke Patients Only)       Balance Overall balance assessment: Needs assistance Sitting-balance support: No upper extremity supported, Feet supported Sitting balance-Leahy Scale: Normal Sitting balance - Comments: steady reaching outside BOS   Standing balance support: No upper extremity supported, During functional activity Standing balance-Leahy Scale: Good Standing balance comment: steady ambulation                             Pertinent Vitals/Pain Pain Assessment Pain Assessment: No/denies pain  Home Living Family/patient expects to be discharged to:: Private residence Living Arrangements: Spouse/significant other;Children;Other relatives (pt's wife, pt's daughter (and daughter's boyfriend)) Available Help at Discharge: Family Type of Home: House Home Access: Stairs to enter Entrance Stairs-Rails: Right;Left;Can reach both Entrance Stairs-Number of Steps: 3-4   Home Layout: One level Home  Equipment: Cane - single point;Rollator (4 wheels);Rolling Walker (2 wheels);BSC/3in1 (Has hurry-cane) Additional Comments: Pt is caregiver for his wife (does not have to provide physical assist for pt with functional mobility); assists with emptying BSC, cooking, cleaning, shopping    Prior Function Prior Level of Function : Independent/Modified Independent;Driving             Mobility Comments: Independent with ambulation shorter distances (uses electric scooter for grocery shopping); will use cane occasionally if needed to perform a lot of exertion       Extremity/Trunk Assessment   Upper Extremity Assessment Upper Extremity Assessment: Defer to OT evaluation;Overall WFL for tasks assessed    Lower Extremity Assessment Lower Extremity Assessment: Generalized weakness    Cervical / Trunk Assessment Cervical / Trunk Assessment: Normal  Communication   Communication Communication: No apparent difficulties    Cognition Arousal: Alert Behavior During Therapy: WFL for tasks assessed/performed   PT - Cognitive impairments: No apparent impairments                         Following commands: Intact       Cueing Cueing Techniques: Verbal cues     General Comments  Nursing cleared pt for participation in physical therapy.  Pt agreeable to PT session.    Exercises     Assessment/Plan    PT Assessment Patient needs continued PT services  PT Problem List Decreased strength;Decreased activity tolerance;Decreased mobility       PT Treatment Interventions DME instruction;Gait training;Stair training;Functional mobility training;Therapeutic activities;Therapeutic exercise;Patient/family education    PT Goals (Current goals can be found in the Care Plan section)  Acute Rehab PT Goals Patient Stated Goal: to go home PT Goal Formulation: With patient Time For Goal Achievement: 03/25/24 Potential to Achieve Goals: Good    Frequency Min 1X/week      Co-evaluation               AM-PAC PT 6 Clicks Mobility  Outcome Measure Help needed turning from your back to your side while in a flat bed without using bedrails?: None Help needed moving from lying on your back to sitting on the side of a flat bed without using bedrails?: None Help needed moving to and from a bed to a chair (including a wheelchair)?: None Help needed standing up from a chair using your arms (e.g., wheelchair or bedside chair)?: None Help needed to walk in hospital room?: A Little Help needed climbing 3-5 steps with a railing? : A Little 6 Click Score: 22    End of Session Equipment Utilized During Treatment: Gait belt Activity Tolerance: Patient tolerated treatment well Patient left: in bed;with call bell/phone within reach Nurse Communication: Mobility status;Precautions;Other (comment) (Pt's HR during session) PT Visit Diagnosis: Muscle weakness (generalized) (M62.81)    Time: 8984-8971 PT Time Calculation (min) (ACUTE ONLY): 13 min   Charges:   PT Evaluation $PT Eval Low Complexity: 1 Low   PT General Charges $$ ACUTE PT VISIT: 1 Visit        Damien Caulk, PT 03/11/24, 11:21 AM

## 2024-03-11 NOTE — Plan of Care (Signed)

## 2024-03-12 LAB — TYPE AND SCREEN
ABO/RH(D): A POS
Antibody Screen: NEGATIVE
Unit division: 0
Unit division: 0
Unit division: 0
Unit division: 0

## 2024-03-12 LAB — BPAM RBC
Blood Product Expiration Date: 202508042359
Blood Product Expiration Date: 202508052359
Blood Product Expiration Date: 202508092359
Blood Product Expiration Date: 202508092359
ISSUE DATE / TIME: 202507120955
ISSUE DATE / TIME: 202507141102
ISSUE DATE / TIME: 202507111819
ISSUE DATE / TIME: 202507112200
Unit Type and Rh: 6200
Unit Type and Rh: 6200
Unit Type and Rh: 6200
Unit Type and Rh: 6200

## 2024-03-13 ENCOUNTER — Inpatient Hospital Stay: Attending: Oncology

## 2024-03-13 DIAGNOSIS — D509 Iron deficiency anemia, unspecified: Secondary | ICD-10-CM | POA: Insufficient documentation

## 2024-03-13 DIAGNOSIS — Z8711 Personal history of peptic ulcer disease: Secondary | ICD-10-CM | POA: Insufficient documentation

## 2024-03-13 DIAGNOSIS — Z87891 Personal history of nicotine dependence: Secondary | ICD-10-CM | POA: Insufficient documentation

## 2024-03-13 LAB — CBC WITH DIFFERENTIAL/PLATELET
Abs Immature Granulocytes: 0.04 K/uL (ref 0.00–0.07)
Basophils Absolute: 0 K/uL (ref 0.0–0.1)
Basophils Relative: 0 %
Eosinophils Absolute: 0.3 K/uL (ref 0.0–0.5)
Eosinophils Relative: 4 %
HCT: 31.7 % — ABNORMAL LOW (ref 39.0–52.0)
Hemoglobin: 9.1 g/dL — ABNORMAL LOW (ref 13.0–17.0)
Immature Granulocytes: 1 %
Lymphocytes Relative: 10 %
Lymphs Abs: 0.7 K/uL (ref 0.7–4.0)
MCH: 24.6 pg — ABNORMAL LOW (ref 26.0–34.0)
MCHC: 28.7 g/dL — ABNORMAL LOW (ref 30.0–36.0)
MCV: 85.7 fL (ref 80.0–100.0)
Monocytes Absolute: 0.6 K/uL (ref 0.1–1.0)
Monocytes Relative: 8 %
Neutro Abs: 5.7 K/uL (ref 1.7–7.7)
Neutrophils Relative %: 77 %
Platelets: 223 K/uL (ref 150–400)
RBC: 3.7 MIL/uL — ABNORMAL LOW (ref 4.22–5.81)
RDW: 22.4 % — ABNORMAL HIGH (ref 11.5–15.5)
WBC: 7.3 K/uL (ref 4.0–10.5)
nRBC: 0.3 % — ABNORMAL HIGH (ref 0.0–0.2)

## 2024-03-13 LAB — FERRITIN: Ferritin: 477 ng/mL — ABNORMAL HIGH (ref 24–336)

## 2024-03-13 LAB — IRON AND TIBC
Iron: 66 ug/dL (ref 45–182)
Saturation Ratios: 18 % (ref 17.9–39.5)
TIBC: 368 ug/dL (ref 250–450)
UIBC: 302 ug/dL

## 2024-03-14 ENCOUNTER — Inpatient Hospital Stay

## 2024-03-14 ENCOUNTER — Inpatient Hospital Stay: Admitting: Oncology

## 2024-03-14 ENCOUNTER — Encounter: Payer: Self-pay | Admitting: Oncology

## 2024-03-14 VITALS — BP 121/62 | HR 47 | Temp 97.8°F | Resp 18 | Ht 70.0 in | Wt 259.4 lb

## 2024-03-14 DIAGNOSIS — D509 Iron deficiency anemia, unspecified: Secondary | ICD-10-CM

## 2024-03-14 NOTE — Progress Notes (Signed)
 [facility]  [phone] [phone]) [phone]  ID: Kevin Gross OB: 1955/12/02  MR#: [id number]  RDW#:[id number]  Patient Care Team: Autry Grayce LABOR, PA as PCP - General (Physician Assistant) Perla Evalene JINNY, MD as PCP - Cardiology (Cardiology) Jacobo Evalene JINNY, MD as Consulting Physician (Oncology)  CHIEF COMPLAINT: Iron  deficiency anemia.  INTERVAL HISTORY: Patient returns to clinic today for repeat laboratory work, further evaluation, and consideration of additional IV Venofer .  He was recently admitted to the hospital with increasing weakness and fatigue and severe anemia.  He was subsequently given 4 units of packed red blood cells and 2 IV iron  infusions.  He feels significantly improved and is nearly back to his baseline.  He does not complain of any weakness or fatigue today.  He has no neurologic complaints.  He denies any fevers.  He has a good appetite and denies weight loss.  He has no chest pain, shortness of breath, cough, or hemoptysis.  He denies any nausea, vomiting, constipation, or diarrhea.  He has no melena or hematochezia.  He has no urinary complaints.  Patient offers no further specific complaints today.  REVIEW OF SYSTEMS:   Review of Systems  Constitutional: Negative.  Negative for fever, malaise/fatigue and weight loss.  Respiratory: Negative.  Negative for cough, hemoptysis and shortness of breath.   Cardiovascular: Negative.  Negative for chest pain and leg swelling.  Gastrointestinal: Negative.  Negative for abdominal pain, blood in stool and melena.  Genitourinary: Negative.  Negative for hematuria.  Musculoskeletal: Negative.  Negative for back pain.  Skin: Negative.   Neurological: Negative.  Negative for dizziness, focal weakness, weakness and headaches.  Psychiatric/Behavioral: Negative.  The patient is not nervous/anxious.     As per HPI. Otherwise, a complete review of systems is negative.  PAST MEDICAL  HISTORY: Past Medical History:  Diagnosis Date   Alcoholic cirrhosis of liver (HCC)    Anemia 2024   Cataract 2024   CHF (congestive heart failure) (HCC)    Chronic gouty arthritis    CKD (chronic kidney disease), stage III (HCC)    Clotting disorder (HCC)    Bilateral legs-pt states plaque build up in both legs requiring intervention   Complication of anesthesia    pt states that years ago during colonoscopy at [facility] they had to abort the procedure due to some cardiac issue had to see cardiologist   COPD (chronic obstructive pulmonary disease) (HCC)    Hyperlipidemia    Hypertension    Hyperuricemia    Peripheral arterial disease (HCC)    Pre-diabetes    Prostate cancer (HCC) 2023   Sleep apnea    uses cpap    PAST SURGICAL HISTORY: Past Surgical History:  Procedure Laterality Date   ABDOMINAL AORTOGRAM W/LOWER EXTREMITY N/A 10/20/2021   Procedure: ABDOMINAL AORTOGRAM W/LOWER EXTREMITY;  Surgeon: Darron Deatrice LABOR, MD;  [facility]: [facility];  Service: Cardiovascular;  Laterality: N/A;   ABDOMINAL AORTOGRAM W/LOWER EXTREMITY N/A 08/08/2023   Procedure: ABDOMINAL AORTOGRAM W/LOWER EXTREMITY;  Surgeon: Serene Gaile ORN, MD;  Location: [facility];  Service: Cardiovascular;  Laterality: N/A;   ALLOGRAFT APPLICATION Bilateral 12/22/2021   Procedure: ALLOGRAFT APPLICATION;  Surgeon: Serene Gaile ORN, MD;  Location: [facility] OR;  Service: Vascular;  Laterality: Bilateral;   ANGIOPLASTY Left 11/26/2021   Procedure: LEFT INFRARENAL LITHOTRIPSY;  Surgeon: Serene Gaile ORN, MD;  Location: [facility] OR;  Service: Vascular;  Laterality: Left;   APPLICATION OF WOUND VAC Bilateral 12/13/2021  Procedure: APPLICATION OF WOUND VAC;  Surgeon: Lanis Fonda BRAVO, MD;  Location: [facility] OR;  Service: Vascular;  Laterality: Bilateral;   CARDIAC CATHETERIZATION     [facility]   CATARACT EXTRACTION W/PHACO Left 04/04/2023   Procedure: CATARACT EXTRACTION PHACO AND INTRAOCULAR LENS PLACEMENT (IOC) LEFT 8.71  00:45.5;  Surgeon: Jaye Fallow, MD;  Location: [facility];  Service: Ophthalmology;  Laterality: Left;   CATARACT EXTRACTION W/PHACO Right 04/18/2023   Procedure: CATARACT EXTRACTION PHACO AND INTRAOCULAR LENS PLACEMENT (IOC) RIGHT 4.31 00:8.2;  Surgeon: Jaye Fallow, MD;  Location: [facility];  Service: Ophthalmology;  Laterality: Right;   COLONOSCOPY     COLONOSCOPY WITH PROPOFOL  N/A 01/17/2018   Procedure: COLONOSCOPY WITH PROPOFOL ;  Surgeon: Toledo, Ladell POUR, MD;  Location: [facility] ENDOSCOPY;  Service: Gastroenterology;  Laterality: N/A;   COLONOSCOPY WITH PROPOFOL  N/A 09/12/2022   Procedure: COLONOSCOPY WITH PROPOFOL ;  Surgeon: Maryruth Ole DASEN, MD;  Location: [facility] ENDOSCOPY;  Service: Endoscopy;  Laterality: N/A;   COLONOSCOPY WITH PROPOFOL  N/A 09/05/2023   Procedure: COLONOSCOPY WITH PROPOFOL ;  Surgeon: Maryruth Ole DASEN, MD;  Location: [facility] ENDOSCOPY;  Service: Endoscopy;  Laterality: N/A;   ENDARTERECTOMY FEMORAL Bilateral 11/26/2021   Procedure: BILATERAL FEMORAL ENDARTERECTOMY WITH VEIN PATCH ANGIOPLASTY;  Surgeon: Serene Gaile ORN, MD;  Location: [facility] OR;  Service: Vascular;  Laterality: Bilateral;   ENTEROSCOPY N/A 09/05/2023   Procedure: ENTEROSCOPY;  Surgeon: Maryruth Ole DASEN, MD;  Location: [facility] ENDOSCOPY;  Service: Endoscopy;  Laterality: N/A;   ESOPHAGOGASTRODUODENOSCOPY (EGD) WITH PROPOFOL  N/A 01/17/2018   Procedure: ESOPHAGOGASTRODUODENOSCOPY (EGD) WITH PROPOFOL ;  Surgeon: Toledo, Ladell POUR, MD;  Location: [facility] ENDOSCOPY;  Service: Gastroenterology;  Laterality: N/A;   ESOPHAGOGASTRODUODENOSCOPY (EGD) WITH PROPOFOL  N/A 09/12/2022   Procedure: ESOPHAGOGASTRODUODENOSCOPY (EGD) WITH PROPOFOL ;  Surgeon: Maryruth Ole DASEN, MD;  Location: [facility] ENDOSCOPY;  Service: Endoscopy;  Laterality: N/A;   EYE SURGERY     GROIN DEBRIDEMENT Bilateral 12/16/2021   Procedure: IRRIGATION AND DEBRIDEMENT OF BILATERAL GROINS;  Surgeon: Serene Gaile ORN, MD;  [facility] OR;  Service: Vascular;  Laterality: Bilateral;   INCISION AND DRAINAGE OF WOUND Bilateral 12/13/2021   Procedure: IRRIGATION AND DEBRIDEMENT OF BILATERAL GROIN WOUNDS;  Surgeon: Lanis Fonda BRAVO, MD;  Location: [facility] OR;  Service: Vascular;  Laterality: Bilateral;   INSERTION OF ILIAC STENT Bilateral 11/26/2021   Procedure: BILATERAL ILIAC STENTING USING 31mmX80mm INNOVA STENT ON LEFT ILIAC AND 67mmX59mm, 34mmX79mm VBX AND 50mmX7.5cm VIABAHN STENT ON RIGHT ILIAC;  Surgeon: Serene Gaile ORN, MD;  Location: [facility] OR;  Service: Vascular;  Laterality: Bilateral;   INSERTION OF SEEDS IN PROSTATE     ORIF ANKLE FRACTURE Right    PERIPHERAL INTRAVASCULAR LITHOTRIPSY  08/08/2023   Procedure: PERIPHERAL INTRAVASCULAR LITHOTRIPSY;  Surgeon: Serene Gaile ORN, MD;  [facility];  Service: Cardiovascular;;   PROSTATE BIOPSY N/A 04/01/2021   Procedure: PROSTATE BIOPSY GRAYCE;  Surgeon: Kassie Ozell SAUNDERS, MD;  Location: [facility] ORS;  Service: Urology;  Laterality: N/A;   VEIN HARVEST Bilateral 11/26/2021   Procedure: VEIN HARVEST OF BILATERAL SPAHENOUS VEINS;  Surgeon: Serene Gaile ORN, MD;  Location: [facility] OR;  Service: Vascular;  Laterality: Bilateral;   WOUND DEBRIDEMENT Bilateral 12/22/2021   Procedure: INCISION AND DEBRIDEMENT OF BILATERAL GROINS;  Surgeon: Serene Gaile ORN, MD;  Location: [facility] OR;  Service: Vascular;  Laterality: Bilateral;    FAMILY HISTORY: Family History  Problem Relation Age of Onset   Heart disease Mother    Heart disease Brother     ADVANCED  DIRECTIVES (Y/N):  N  HEALTH MAINTENANCE: Social History   Tobacco Use   Smoking status: Former    Current packs/day: 0.00    Average packs/day: 1 pack/day for 47.0 years (47.0 ttl pk-yrs)    Types: Cigarettes    Start date: 48    Quit date: 2016    Years since quitting: 9.5    Passive exposure: Never   Smokeless tobacco: Never  Vaping Use   Vaping status: Never Used  Substance Use Topics   Alcohol use: Not Currently    Drug use: No     Colonoscopy:  PAP:  Bone density:  Lipid panel:  Allergies  Allergen Reactions   Jardiance [Empagliflozin]     Loss of balance, hypoglycemic     Current Outpatient Medications  Medication Sig Dispense Refill   allopurinol  (ZYLOPRIM ) 100 MG tablet Take 100 mg by mouth daily.     allopurinol  (ZYLOPRIM ) 300 MG tablet Take 600 mg by mouth daily.     amiodarone  (PACERONE ) 200 MG tablet Take 200 mg by mouth 2 (two) times daily.     aspirin  EC 81 MG tablet Take 81 mg by mouth every morning.     atenolol  (TENORMIN ) 25 MG tablet Take 25 mg by mouth daily. (Patient not taking: Reported on 03/14/2024)     chlorthalidone  (HYGROTON ) 25 MG tablet Take 25 mg by mouth daily.     Cholecalciferol  (VITAMIN D3) 25 MCG (1000 UT) CAPS Take 2,000 Units by mouth daily.     clopidogrel  (PLAVIX ) 75 MG tablet Take 1 tablet (75 mg total) by mouth daily. 30 tablet 11   ezetimibe  (ZETIA ) 10 MG tablet Take 1 tablet (10 mg total) by mouth daily. 90 tablet 3   iron  polysaccharides (NIFEREX) 150 MG capsule Take 1 capsule (150 mg total) by mouth 2 (two) times daily. (Patient not taking: Reported on 03/14/2024) 180 capsule 0   lisinopril  (ZESTRIL ) 40 MG tablet Take 40 mg by mouth daily.     simvastatin  (ZOCOR ) 20 MG tablet Take 20 mg by mouth in the morning.     triamcinolone  ointment (KENALOG ) 0.1 % Apply 1 Application topically 2 (two) times daily as needed (psoriasis).     zolpidem  (AMBIEN ) 10 MG tablet Take 10 mg by mouth at bedtime.     No current facility-administered medications for this visit.    OBJECTIVE: Vitals:   03/14/24 1414  BP: 121/62  Pulse: (!) 47  Resp: 18  Temp: 97.8 F (36.6 C)  SpO2: 100%     Body mass index is 37.22 kg/m.    ECOG FS:0 - Asymptomatic  General: Well-developed, well-nourished, no acute distress. Eyes: Pink conjunctiva, anicteric sclera. HEENT: Normocephalic, moist mucous membranes. Lungs: No audible wheezing or coughing. Heart: Regular rate and  rhythm. Abdomen: Soft, nontender, no obvious distention. Musculoskeletal: No edema, cyanosis, or clubbing. Neuro: Alert, answering all questions appropriately. Cranial nerves grossly intact. Skin: No rashes or petechiae noted. Psych: Normal affect.  LAB RESULTS:  Lab Results  Component Value Date   NA 140 03/10/2024   K 3.9 03/10/2024   CL 113 (H) 03/10/2024   CO2 21 (L) 03/10/2024   GLUCOSE 77 03/10/2024   BUN 49 (H) 03/10/2024   CREATININE 2.62 (H) 03/10/2024   CALCIUM  7.7 (L) 03/10/2024   PROT 4.2 (L) 03/08/2024   ALBUMIN  2.0 (L) 03/08/2024   AST 57 (H) 03/08/2024   ALT 51 (H) 03/08/2024   ALKPHOS 67 03/08/2024   BILITOT 0.5 03/08/2024   GFRNONAA 26 (L)  03/10/2024   GFRAA 56 (L) 10/26/2019    Lab Results  Component Value Date   WBC 7.3 03/13/2024   NEUTROABS 5.7 03/13/2024   HGB 9.1 (L) 03/13/2024   HCT 31.7 (L) 03/13/2024   MCV 85.7 03/13/2024   PLT 223 03/13/2024   Lab Results  Component Value Date   IRON  66 03/13/2024   TIBC 368 03/13/2024   IRONPCTSAT 18 03/13/2024   Lab Results  Component Value Date   FERRITIN 477 (H) 03/13/2024     STUDIES: ECHOCARDIOGRAM COMPLETE Result Date: 03/09/2024    ECHOCARDIOGRAM REPORT   Patient Name:   LOUKAS ANTONSON Ringgenberg Date of Exam: 03/09/2024 Medical Rec #:  [id number]       Height:       70.0 in Accession #:    [id number]      Weight:       268.0 lb Date of Birth:  1956-08-06       BSA:          2.364 m Patient Age:    68 years        BP:           129/56 mmHg Patient Gender: M               HR:           44 bpm. Exam Location:  [facility] Procedure: 2D Echo, Cardiac Doppler, Color Doppler and Intracardiac            Opacification Agent (Both Spectral and Color Flow Doppler were            utilized during procedure). Indications:     CHF I50.31  History:         Patient has prior history of Echocardiogram examinations, most                  recent 11/08/2021.  Sonographer:     Thedora Louder RDCS, FASE Referring Phys:  [id number] MADISON A  MANSY Diagnosing Phys: Cara JONETTA Lovelace MD  Sonographer Comments: Technically difficult study due to poor echo windows, suboptimal parasternal window, suboptimal apical window, suboptimal subcostal window and patient is obese. Image acquisition challenging due to patient body habitus and Image acquisition challenging due to respiratory motion. IMPRESSIONS  1. Left ventricular ejection fraction, by estimation, is 60 to 65%. The left ventricle has normal function. The left ventricle has no regional wall motion abnormalities. The left ventricular internal cavity size was moderately to severely dilated. There  is mild left ventricular hypertrophy. Left ventricular diastolic parameters are consistent with Grade II diastolic dysfunction (pseudonormalization).  2. Right ventricular systolic function is normal. The right ventricular size is normal.  3. Left atrial size was mildly dilated.  4. The mitral valve is normal in structure. No evidence of mitral valve regurgitation.  5. The aortic valve is normal in structure. Aortic valve regurgitation is not visualized.  6. Aortic dilatation noted. There is mild dilatation of the aortic root, measuring 40 mm. FINDINGS  Left Ventricle: Left ventricular ejection fraction, by estimation, is 60 to 65%. The left ventricle has normal function. The left ventricle has no regional wall motion abnormalities. Strain was performed and the global longitudinal strain is indeterminate. The left ventricular internal cavity size was moderately to severely dilated. There is mild left ventricular hypertrophy. Left ventricular diastolic parameters are consistent with Grade II diastolic dysfunction (pseudonormalization). Right Ventricle: The right ventricular size is normal. No increase in right ventricular wall thickness. Right ventricular systolic  function is normal. Left Atrium: Left atrial size was mildly dilated. Right Atrium: Right atrial size was normal in size. Pericardium: There is no  evidence of pericardial effusion. Mitral Valve: The mitral valve is normal in structure. No evidence of mitral valve regurgitation. Tricuspid Valve: The tricuspid valve is normal in structure. Tricuspid valve regurgitation is trivial. Aortic Valve: The aortic valve is normal in structure. Aortic valve regurgitation is not visualized. Aortic valve peak gradient measures 12.0 mmHg. Pulmonic Valve: The pulmonic valve was normal in structure. Pulmonic valve regurgitation is not visualized. Aorta: The ascending aorta was not well visualized and aortic dilatation noted. There is mild dilatation of the aortic root, measuring 40 mm. IAS/Shunts: No atrial level shunt detected by color flow Doppler. Additional Comments: 3D was performed not requiring image post processing on an independent workstation and was indeterminate.  LEFT VENTRICLE PLAX 2D LVIDd:         5.90 cm   Diastology LVIDs:         4.00 cm   LV e' medial:    7.40 cm/s LV PW:         1.10 cm   LV E/e' medial:  10.5 LV IVS:        1.20 cm   LV e' lateral:   11.20 cm/s LVOT diam:     2.10 cm   LV E/e' lateral: 6.9 LV SV:         77 LV SV Index:   33 LVOT Area:     3.46 cm  RIGHT VENTRICLE RV Basal diam:  3.90 cm RV S prime:     16.80 cm/s TAPSE (M-mode): 2.4 cm LEFT ATRIUM           Index        RIGHT ATRIUM           Index LA diam:      4.30 cm 1.82 cm/m   RA Area:     13.50 cm LA Vol (A4C): 72.9 ml 30.84 ml/m  RA Volume:   33.10 ml  14.00 ml/m  AORTIC VALVE                 PULMONIC VALVE AV Area (Vmax): 1.75 cm     PV Vmax:        1.14 m/s AV Vmax:        173.00 cm/s  PV Peak grad:   5.2 mmHg AV Peak Grad:   12.0 mmHg    RVOT Peak grad: 5 mmHg LVOT Vmax:      87.60 cm/s LVOT Vmean:     60.000 cm/s LVOT VTI:       0.223 m  AORTA Ao Root diam: 4.30 cm Ao Asc diam:  3.70 cm MITRAL VALVE MV Area (PHT): 2.11 cm    SHUNTS MV Decel Time: 359 msec    Systemic VTI:  0.22 m MV E velocity: 77.40 cm/s  Systemic Diam: 2.10 cm MV A velocity: 71.00 cm/s MV E/A ratio:   1.09 Dwayne D Callwood MD Electronically signed by Cara JONETTA Lovelace MD Signature Date/Time: 03/09/2024/3:56:13 PM    Final    DG Chest 2 View Result Date: 03/08/2024 CLINICAL DATA:  Shortness of breath.  Lower extremity edema. EXAM: CHEST - 2 VIEW COMPARISON:  October 19, 2022. FINDINGS: Stable cardiomediastinal silhouette. Both lungs are clear. The visualized skeletal structures are unremarkable. IMPRESSION: No active cardiopulmonary disease. Electronically Signed   By: Lynwood Landy Raddle M.D.   On: 03/08/2024 17:51  ASSESSMENT: Iron  deficiency anemia.  PLAN:    Iron  deficiency anemia: His most recent colonoscopy and EGD in January 2025 revealed nonbleeding superficial duodenal ulcer and angiectasia in the duodenum and the gastric body, all treated with argon plasma beam.  No other pathology was noted.  Patient's hemoglobin declined to 5.2 and patient was admitted to the hospital where he received 4 units of packed red blood cells as well as 2 doses of IV iron .  Hemoglobin is now significantly improved to 9.1 and his iron  stores are within normal limits.  He does not require additional transfusion or IV Venofer  today.  Return to clinic in 1 month with repeat laboratory, further evaluation, and continuation of treatment if necessary.    I spent a total of 30 minutes reviewing chart data, face-to-face evaluation with the patient, counseling and coordination of care as detailed above.   Patient expressed understanding and was in agreement with this plan. He also understands that He can call clinic at any time with any questions, concerns, or complaints.    Evalene JINNY Reusing, MD   03/14/2024 3:03 PM

## 2024-04-10 ENCOUNTER — Other Ambulatory Visit: Payer: Self-pay

## 2024-04-10 DIAGNOSIS — I739 Peripheral vascular disease, unspecified: Secondary | ICD-10-CM

## 2024-04-15 ENCOUNTER — Inpatient Hospital Stay: Attending: Oncology

## 2024-04-15 DIAGNOSIS — Z87891 Personal history of nicotine dependence: Secondary | ICD-10-CM | POA: Diagnosis not present

## 2024-04-15 DIAGNOSIS — D509 Iron deficiency anemia, unspecified: Secondary | ICD-10-CM | POA: Diagnosis present

## 2024-04-15 DIAGNOSIS — N289 Disorder of kidney and ureter, unspecified: Secondary | ICD-10-CM | POA: Insufficient documentation

## 2024-04-15 LAB — CBC WITH DIFFERENTIAL/PLATELET
Abs Immature Granulocytes: 0.04 K/uL (ref 0.00–0.07)
Basophils Absolute: 0 K/uL (ref 0.0–0.1)
Basophils Relative: 0 %
Eosinophils Absolute: 0.1 K/uL (ref 0.0–0.5)
Eosinophils Relative: 2 %
HCT: 33.3 % — ABNORMAL LOW (ref 39.0–52.0)
Hemoglobin: 10.2 g/dL — ABNORMAL LOW (ref 13.0–17.0)
Immature Granulocytes: 1 %
Lymphocytes Relative: 10 %
Lymphs Abs: 0.7 K/uL (ref 0.7–4.0)
MCH: 29.1 pg (ref 26.0–34.0)
MCHC: 30.6 g/dL (ref 30.0–36.0)
MCV: 95.1 fL (ref 80.0–100.0)
Monocytes Absolute: 0.6 K/uL (ref 0.1–1.0)
Monocytes Relative: 8 %
Neutro Abs: 6.1 K/uL (ref 1.7–7.7)
Neutrophils Relative %: 79 %
Platelets: 173 K/uL (ref 150–400)
RBC: 3.5 MIL/uL — ABNORMAL LOW (ref 4.22–5.81)
RDW: 28.5 % — ABNORMAL HIGH (ref 11.5–15.5)
WBC: 7.7 K/uL (ref 4.0–10.5)
nRBC: 0 % (ref 0.0–0.2)

## 2024-04-15 LAB — IRON AND TIBC
Iron: 60 ug/dL (ref 45–182)
Saturation Ratios: 25 % (ref 17.9–39.5)
TIBC: 245 ug/dL — ABNORMAL LOW (ref 250–450)
UIBC: 185 ug/dL

## 2024-04-15 LAB — FERRITIN: Ferritin: 209 ng/mL (ref 24–336)

## 2024-04-16 ENCOUNTER — Inpatient Hospital Stay

## 2024-04-16 ENCOUNTER — Encounter: Payer: Self-pay | Admitting: Oncology

## 2024-04-16 ENCOUNTER — Inpatient Hospital Stay: Admitting: Oncology

## 2024-04-16 VITALS — BP 105/56 | HR 57 | Temp 97.5°F | Resp 18 | Ht 70.0 in | Wt 258.0 lb

## 2024-04-16 DIAGNOSIS — D509 Iron deficiency anemia, unspecified: Secondary | ICD-10-CM

## 2024-04-16 NOTE — Progress Notes (Signed)
 [facility]  [phone] Fax:(336) [phone]  ID: Kevin Gross OB: 02-05-1956  MR#: [id number]  RDW#:[id number]  Patient Care Team: Autry Grayce LABOR, PA as PCP - General (Physician Assistant) Perla Evalene JINNY, MD as PCP - Cardiology (Cardiology) Jacobo Evalene JINNY, MD as Consulting Physician (Oncology)  CHIEF COMPLAINT: Iron  deficiency anemia.  INTERVAL HISTORY: Patient returns to clinic today for repeat laboratory work, further evaluation, and consideration of additional IV Venofer .  He currently feels well and is asymptomatic.  He does not complain of any weakness or fatigue today. He has no neurologic complaints.  He denies any fevers.  He has a good appetite and denies weight loss.  He has no chest pain, shortness of breath, cough, or hemoptysis.  He denies any nausea, vomiting, constipation, or diarrhea.  He has no melena or hematochezia.  He has no urinary complaints.  Patient offers no specific complaints today.  REVIEW OF SYSTEMS:   Review of Systems  Constitutional: Negative.  Negative for fever, malaise/fatigue and weight loss.  Respiratory: Negative.  Negative for cough, hemoptysis and shortness of breath.   Cardiovascular: Negative.  Negative for chest pain and leg swelling.  Gastrointestinal: Negative.  Negative for abdominal pain, blood in stool and melena.  Genitourinary: Negative.  Negative for hematuria.  Musculoskeletal: Negative.  Negative for back pain.  Skin: Negative.   Neurological: Negative.  Negative for dizziness, focal weakness, weakness and headaches.  Psychiatric/Behavioral: Negative.  The patient is not nervous/anxious.     As per HPI. Otherwise, a complete review of systems is negative.  PAST MEDICAL HISTORY: Past Medical History:  Diagnosis Date   Alcoholic cirrhosis of liver (HCC)    Anemia 2024   Cataract 2024   CHF (congestive heart failure) (HCC)    Chronic gouty arthritis    CKD (chronic kidney disease),  stage III (HCC)    Clotting disorder (HCC)    Bilateral legs-pt states plaque build up in both legs requiring intervention   Complication of anesthesia    pt states that years ago during colonoscopy at [facility] they had to abort the procedure due to some cardiac issue had to see cardiologist   COPD (chronic obstructive pulmonary disease) (HCC)    Hyperlipidemia    Hypertension    Hyperuricemia    Peripheral arterial disease (HCC)    Pre-diabetes    Prostate cancer (HCC) 2023   Sleep apnea    uses cpap    PAST SURGICAL HISTORY: Past Surgical History:  Procedure Laterality Date   ABDOMINAL AORTOGRAM W/LOWER EXTREMITY N/A 10/20/2021   Procedure: ABDOMINAL AORTOGRAM W/LOWER EXTREMITY;  Surgeon: Darron Deatrice LABOR, MD;  Location: [facility];  Service: Cardiovascular;  Laterality: N/A;   ABDOMINAL AORTOGRAM W/LOWER EXTREMITY N/A 08/08/2023   Procedure: ABDOMINAL AORTOGRAM W/LOWER EXTREMITY;  Surgeon: Serene Gaile ORN, MD;  Location: [facility];  Service: Cardiovascular;  Laterality: N/A;   ALLOGRAFT APPLICATION Bilateral 12/22/2021   Procedure: ALLOGRAFT APPLICATION;  Surgeon: Serene Gaile ORN, MD;  Location: [facility] OR;  Service: Vascular;  Laterality: Bilateral;   ANGIOPLASTY Left 11/26/2021   Procedure: LEFT INFRARENAL LITHOTRIPSY;  Surgeon: Serene Gaile ORN, MD;  Location: [facility] OR;  Service: Vascular;  Laterality: Left;   APPLICATION OF WOUND VAC Bilateral 12/13/2021   Procedure: APPLICATION OF WOUND VAC;  Surgeon: Lanis Fonda BRAVO, MD;  Location: [facility] OR;  Service: Vascular;  Laterality: Bilateral;   CARDIAC CATHETERIZATION     [facility]   CATARACT EXTRACTION W/PHACO Left 04/04/2023  Procedure: CATARACT EXTRACTION PHACO AND INTRAOCULAR LENS PLACEMENT (IOC) LEFT 8.71 00:45.5;  Surgeon: Jaye Fallow, MD;  Location: [facility];  Service: Ophthalmology;  Laterality: Left;   CATARACT EXTRACTION W/PHACO Right 04/18/2023   Procedure: CATARACT EXTRACTION PHACO AND INTRAOCULAR  LENS PLACEMENT (IOC) RIGHT 4.31 00:8.2;  Surgeon: Jaye Fallow, MD;  Location: [facility];  Service: Ophthalmology;  Laterality: Right;   COLONOSCOPY     COLONOSCOPY WITH PROPOFOL  N/A 01/17/2018   Procedure: COLONOSCOPY WITH PROPOFOL ;  Surgeon: Toledo, Ladell POUR, MD;  Location: [facility];  Service: Gastroenterology;  Laterality: N/A;   COLONOSCOPY WITH PROPOFOL  N/A 09/12/2022   Procedure: COLONOSCOPY WITH PROPOFOL ;  Surgeon: Maryruth Ole DASEN, MD;  Location: [facility];  Service: Endoscopy;  Laterality: N/A;   COLONOSCOPY WITH PROPOFOL  N/A 09/05/2023   Procedure: COLONOSCOPY WITH PROPOFOL ;  Surgeon: Maryruth Ole DASEN, MD;  Location: [facility];  Service: Endoscopy;  Laterality: N/A;   ENDARTERECTOMY FEMORAL Bilateral 11/26/2021   Procedure: BILATERAL FEMORAL ENDARTERECTOMY WITH VEIN PATCH ANGIOPLASTY;  Surgeon: Serene Gaile ORN, MD;  Location: [facility] OR;  Service: Vascular;  Laterality: Bilateral;   ENTEROSCOPY N/A 09/05/2023   Procedure: ENTEROSCOPY;  Surgeon: Maryruth Ole DASEN, MD;  Location: [facility];  Service: Endoscopy;  Laterality: N/A;   ESOPHAGOGASTRODUODENOSCOPY (EGD) WITH PROPOFOL  N/A 01/17/2018   Procedure: ESOPHAGOGASTRODUODENOSCOPY (EGD) WITH PROPOFOL ;  Surgeon: Toledo, Ladell POUR, MD;  Location: [facility];  Service: Gastroenterology;  Laterality: N/A;   ESOPHAGOGASTRODUODENOSCOPY (EGD) WITH PROPOFOL  N/A 09/12/2022   Procedure: ESOPHAGOGASTRODUODENOSCOPY (EGD) WITH PROPOFOL ;  Surgeon: Maryruth Ole DASEN, MD;  Location: [facility];  Service: Endoscopy;  Laterality: N/A;   EYE SURGERY     GROIN DEBRIDEMENT Bilateral 12/16/2021   Procedure: IRRIGATION AND DEBRIDEMENT OF BILATERAL GROINS;  Surgeon: Serene Gaile ORN, MD;  Location: [facility];  Service: Vascular;  Laterality: Bilateral;   INCISION AND DRAINAGE OF WOUND Bilateral 12/13/2021   Procedure: IRRIGATION AND DEBRIDEMENT OF BILATERAL GROIN WOUNDS;  Surgeon: Lanis Fonda BRAVO, MD;  Location: [facility] OR;   Service: Vascular;  Laterality: Bilateral;   INSERTION OF ILIAC STENT Bilateral 11/26/2021   Procedure: BILATERAL ILIAC STENTING USING 62mmX80mm INNOVA STENT ON LEFT ILIAC AND 22mmX59mm, 38mmX79mm VBX AND 65mmX7.5cm VIABAHN STENT ON RIGHT ILIAC;  Surgeon: Serene Gaile ORN, MD;  Location: [facility] OR;  Service: Vascular;  Laterality: Bilateral;   INSERTION OF SEEDS IN PROSTATE     ORIF ANKLE FRACTURE Right    PERIPHERAL INTRAVASCULAR LITHOTRIPSY  08/08/2023   Procedure: PERIPHERAL INTRAVASCULAR LITHOTRIPSY;  Surgeon: Serene Gaile ORN, MD;  Location: [facility];  Service: Cardiovascular;;   PROSTATE BIOPSY N/A 04/01/2021   Procedure: PROSTATE BIOPSY GRAYCE;  Surgeon: Kassie Ozell SAUNDERS, MD;  Location: [facility] ORS;  Service: Urology;  Laterality: N/A;   VEIN HARVEST Bilateral 11/26/2021   Procedure: VEIN HARVEST OF BILATERAL SPAHENOUS VEINS;  Surgeon: Serene Gaile ORN, MD;  Location: [facility];  Service: Vascular;  Laterality: Bilateral;   WOUND DEBRIDEMENT Bilateral 12/22/2021   Procedure: INCISION AND DEBRIDEMENT OF BILATERAL GROINS;  Surgeon: Serene Gaile ORN, MD;  Location: [facility] OR;  Service: Vascular;  Laterality: Bilateral;    FAMILY HISTORY: Family History  Problem Relation Age of Onset   Heart disease Mother    Heart disease Brother     ADVANCED DIRECTIVES (Y/N):  N  HEALTH MAINTENANCE: Social History   Tobacco Use   Smoking status: Former    Current packs/day: 0.00    Average packs/day: 1 pack/day for 47.0 years (47.0 ttl pk-yrs)  Types: Cigarettes    Start date: 85    Quit date: 2016    Years since quitting: 9.6    Passive exposure: Never   Smokeless tobacco: Never  Vaping Use   Vaping status: Never Used  Substance Use Topics   Alcohol use: Not Currently   Drug use: No     Colonoscopy:  PAP:  Bone density:  Lipid panel:  Allergies  Allergen Reactions   Jardiance [Empagliflozin]     Loss of balance, hypoglycemic     Current Outpatient Medications  Medication Sig  Dispense Refill   allopurinol  (ZYLOPRIM ) 100 MG tablet Take 100 mg by mouth daily.     allopurinol  (ZYLOPRIM ) 300 MG tablet Take 600 mg by mouth daily.     amiodarone  (PACERONE ) 200 MG tablet Take 200 mg by mouth 2 (two) times daily.     aspirin  EC 81 MG tablet Take 81 mg by mouth every morning.     chlorthalidone  (HYGROTON ) 25 MG tablet Take 25 mg by mouth daily.     Cholecalciferol  (VITAMIN D3) 25 MCG (1000 UT) CAPS Take 2,000 Units by mouth daily.     clopidogrel  (PLAVIX ) 75 MG tablet Take 1 tablet (75 mg total) by mouth daily. 30 tablet 11   ezetimibe  (ZETIA ) 10 MG tablet Take 1 tablet (10 mg total) by mouth daily. 90 tablet 3   iron  polysaccharides (NIFEREX) 150 MG capsule Take 1 capsule (150 mg total) by mouth 2 (two) times daily. 180 capsule 0   lisinopril  (ZESTRIL ) 40 MG tablet Take 40 mg by mouth daily.     simvastatin  (ZOCOR ) 20 MG tablet Take 20 mg by mouth in the morning.     triamcinolone  ointment (KENALOG ) 0.1 % Apply 1 Application topically 2 (two) times daily as needed (psoriasis).     zolpidem  (AMBIEN ) 10 MG tablet Take 10 mg by mouth at bedtime.     atenolol  (TENORMIN ) 25 MG tablet Take 25 mg by mouth daily. (Patient not taking: Reported on 04/16/2024)     No current facility-administered medications for this visit.    OBJECTIVE: Vitals:   04/16/24 1443  BP: (!) 105/56  Pulse: (!) 57  Resp: 18  Temp: (!) 97.5 F (36.4 C)  SpO2: 100%     Body mass index is 37.02 kg/m.    ECOG FS:0 - Asymptomatic  General: Well-developed, well-nourished, no acute distress. Eyes: Pink conjunctiva, anicteric sclera. HEENT: Normocephalic, moist mucous membranes. Lungs: No audible wheezing or coughing. Heart: Regular rate and rhythm. Abdomen: Soft, nontender, no obvious distention. Musculoskeletal: No edema, cyanosis, or clubbing. Neuro: Alert, answering all questions appropriately. Cranial nerves grossly intact. Skin: No rashes or petechiae noted. Psych: Normal affect.  LAB  RESULTS:  Lab Results  Component Value Date   NA 140 03/10/2024   K 3.9 03/10/2024   CL 113 (H) 03/10/2024   CO2 21 (L) 03/10/2024   GLUCOSE 77 03/10/2024   BUN 49 (H) 03/10/2024   CREATININE 2.62 (H) 03/10/2024   CALCIUM  7.7 (L) 03/10/2024   PROT 4.2 (L) 03/08/2024   ALBUMIN  2.0 (L) 03/08/2024   AST 57 (H) 03/08/2024   ALT 51 (H) 03/08/2024   ALKPHOS 67 03/08/2024   BILITOT 0.5 03/08/2024   GFRNONAA 26 (L) 03/10/2024   GFRAA 56 (L) 10/26/2019    Lab Results  Component Value Date   WBC 7.7 04/15/2024   NEUTROABS 6.1 04/15/2024   HGB 10.2 (L) 04/15/2024   HCT 33.3 (L) 04/15/2024   MCV 95.1 04/15/2024  PLT 173 04/15/2024   Lab Results  Component Value Date   IRON  60 04/15/2024   TIBC 245 (L) 04/15/2024   IRONPCTSAT 25 04/15/2024   Lab Results  Component Value Date   FERRITIN 209 04/15/2024     STUDIES: No results found.   ASSESSMENT: Iron  deficiency anemia.  PLAN:    Iron  deficiency anemia: His most recent colonoscopy and EGD in January 2025 revealed nonbleeding superficial duodenal ulcer and angiectasia in the duodenum and the gastric body, all treated with argon plasma beam.  No other pathology was noted.  Patient's hemoglobin continues to be decreased, but is trending up and now 10.2.  Iron  stores are now within normal limits.  Patient does not require additional IV Venofer  at this time.  He last received treatment on March 10, 2024.  No intervention is needed at this time.  Return to clinic in 3 months with repeat laboratory work, further evaluation, and consideration of additional treatment if necessary.   Renal insufficiency: Patient's last creatinine was reported at 2.62.  Monitor.   I spent a total of 20 minutes reviewing chart data, face-to-face evaluation with the patient, counseling and coordination of care as detailed above.    Patient expressed understanding and was in agreement with this plan. He also understands that He can call clinic at any  time with any questions, concerns, or complaints.    Evalene JINNY Reusing, MD   04/20/2024 8:15 AM

## 2024-04-16 NOTE — Progress Notes (Signed)
 Patient is feeling sluggish and weak. He was prescribed atenolol  a while back and he told me that he hasn't been taking it due to nonne ever sending over a prescription.

## 2024-04-20 ENCOUNTER — Encounter: Payer: Self-pay | Admitting: Oncology

## 2024-04-23 ENCOUNTER — Encounter: Payer: Self-pay | Admitting: Oncology

## 2024-05-03 ENCOUNTER — Ambulatory Visit
Admission: RE | Admit: 2024-05-03 | Discharge: 2024-05-03 | Disposition: A | Discharge status: Home / Self Care | Source: Ambulatory Visit | Attending: Physician Assistant | Admitting: Physician Assistant

## 2024-05-03 ENCOUNTER — Other Ambulatory Visit: Payer: Self-pay | Admitting: Physician Assistant

## 2024-05-03 DIAGNOSIS — J209 Acute bronchitis, unspecified: Secondary | ICD-10-CM

## 2024-05-08 ENCOUNTER — Encounter: Payer: Self-pay | Admitting: *Deleted

## 2024-05-13 ENCOUNTER — Other Ambulatory Visit: Payer: Self-pay | Admitting: Surgery

## 2024-05-13 DIAGNOSIS — I739 Peripheral vascular disease, unspecified: Secondary | ICD-10-CM

## 2024-05-15 ENCOUNTER — Emergency Department

## 2024-05-15 ENCOUNTER — Other Ambulatory Visit: Payer: Self-pay

## 2024-05-15 ENCOUNTER — Inpatient Hospital Stay

## 2024-05-15 ENCOUNTER — Inpatient Hospital Stay
Admission: EM | Admit: 2024-05-15 | Discharge: 2024-05-24 | DRG: 673 | Disposition: A | Discharge status: Home Health | Source: Ambulatory Visit | Attending: Internal Medicine | Admitting: Internal Medicine

## 2024-05-15 DIAGNOSIS — Z6837 Body mass index (BMI) 37.0-37.9, adult: Secondary | ICD-10-CM

## 2024-05-15 DIAGNOSIS — Z7902 Long term (current) use of antithrombotics/antiplatelets: Secondary | ICD-10-CM

## 2024-05-15 DIAGNOSIS — N170 Acute kidney failure with tubular necrosis: Secondary | ICD-10-CM | POA: Diagnosis present

## 2024-05-15 DIAGNOSIS — N184 Chronic kidney disease, stage 4 (severe): Secondary | ICD-10-CM

## 2024-05-15 DIAGNOSIS — Z87891 Personal history of nicotine dependence: Secondary | ICD-10-CM | POA: Diagnosis not present

## 2024-05-15 DIAGNOSIS — J440 Chronic obstructive pulmonary disease with acute lower respiratory infection: Secondary | ICD-10-CM | POA: Diagnosis not present

## 2024-05-15 DIAGNOSIS — R7401 Elevation of levels of liver transaminase levels: Secondary | ICD-10-CM

## 2024-05-15 DIAGNOSIS — Z23 Encounter for immunization: Secondary | ICD-10-CM | POA: Diagnosis present

## 2024-05-15 DIAGNOSIS — N186 End stage renal disease: Secondary | ICD-10-CM | POA: Diagnosis present

## 2024-05-15 DIAGNOSIS — D631 Anemia in chronic kidney disease: Secondary | ICD-10-CM | POA: Diagnosis present

## 2024-05-15 DIAGNOSIS — E8721 Acute metabolic acidosis: Secondary | ICD-10-CM | POA: Diagnosis present

## 2024-05-15 DIAGNOSIS — Z7982 Long term (current) use of aspirin: Secondary | ICD-10-CM | POA: Diagnosis not present

## 2024-05-15 DIAGNOSIS — I480 Paroxysmal atrial fibrillation: Secondary | ICD-10-CM | POA: Diagnosis present

## 2024-05-15 DIAGNOSIS — E8770 Fluid overload, unspecified: Secondary | ICD-10-CM | POA: Diagnosis present

## 2024-05-15 DIAGNOSIS — A0472 Enterocolitis due to Clostridium difficile, not specified as recurrent: Secondary | ICD-10-CM | POA: Diagnosis not present

## 2024-05-15 DIAGNOSIS — I10 Essential (primary) hypertension: Secondary | ICD-10-CM | POA: Diagnosis present

## 2024-05-15 DIAGNOSIS — R0602 Shortness of breath: Secondary | ICD-10-CM | POA: Diagnosis present

## 2024-05-15 DIAGNOSIS — I739 Peripheral vascular disease, unspecified: Secondary | ICD-10-CM | POA: Diagnosis present

## 2024-05-15 DIAGNOSIS — N189 Chronic kidney disease, unspecified: Secondary | ICD-10-CM | POA: Diagnosis not present

## 2024-05-15 DIAGNOSIS — J189 Pneumonia, unspecified organism: Secondary | ICD-10-CM | POA: Diagnosis not present

## 2024-05-15 DIAGNOSIS — Z1152 Encounter for screening for COVID-19: Secondary | ICD-10-CM | POA: Diagnosis not present

## 2024-05-15 DIAGNOSIS — F1010 Alcohol abuse, uncomplicated: Secondary | ICD-10-CM | POA: Diagnosis present

## 2024-05-15 DIAGNOSIS — I5032 Chronic diastolic (congestive) heart failure: Secondary | ICD-10-CM | POA: Diagnosis present

## 2024-05-15 DIAGNOSIS — Z8249 Family history of ischemic heart disease and other diseases of the circulatory system: Secondary | ICD-10-CM

## 2024-05-15 DIAGNOSIS — R579 Shock, unspecified: Secondary | ICD-10-CM | POA: Diagnosis not present

## 2024-05-15 DIAGNOSIS — Z8711 Personal history of peptic ulcer disease: Secondary | ICD-10-CM

## 2024-05-15 DIAGNOSIS — D649 Anemia, unspecified: Secondary | ICD-10-CM | POA: Diagnosis present

## 2024-05-15 DIAGNOSIS — I132 Hypertensive heart and chronic kidney disease with heart failure and with stage 5 chronic kidney disease, or end stage renal disease: Secondary | ICD-10-CM | POA: Diagnosis present

## 2024-05-15 DIAGNOSIS — Z888 Allergy status to other drugs, medicaments and biological substances status: Secondary | ICD-10-CM

## 2024-05-15 DIAGNOSIS — R197 Diarrhea, unspecified: Secondary | ICD-10-CM

## 2024-05-15 DIAGNOSIS — I5033 Acute on chronic diastolic (congestive) heart failure: Secondary | ICD-10-CM | POA: Diagnosis not present

## 2024-05-15 DIAGNOSIS — I81 Portal vein thrombosis: Secondary | ICD-10-CM | POA: Diagnosis present

## 2024-05-15 DIAGNOSIS — Z992 Dependence on renal dialysis: Secondary | ICD-10-CM | POA: Diagnosis not present

## 2024-05-15 DIAGNOSIS — J449 Chronic obstructive pulmonary disease, unspecified: Secondary | ICD-10-CM | POA: Diagnosis not present

## 2024-05-15 DIAGNOSIS — K7031 Alcoholic cirrhosis of liver with ascites: Secondary | ICD-10-CM | POA: Diagnosis present

## 2024-05-15 DIAGNOSIS — E66812 Obesity, class 2: Secondary | ICD-10-CM | POA: Diagnosis present

## 2024-05-15 DIAGNOSIS — E871 Hypo-osmolality and hyponatremia: Secondary | ICD-10-CM | POA: Diagnosis not present

## 2024-05-15 DIAGNOSIS — D696 Thrombocytopenia, unspecified: Secondary | ICD-10-CM | POA: Diagnosis present

## 2024-05-15 DIAGNOSIS — I959 Hypotension, unspecified: Secondary | ICD-10-CM

## 2024-05-15 DIAGNOSIS — N2581 Secondary hyperparathyroidism of renal origin: Secondary | ICD-10-CM | POA: Diagnosis present

## 2024-05-15 DIAGNOSIS — N179 Acute kidney failure, unspecified: Secondary | ICD-10-CM | POA: Diagnosis not present

## 2024-05-15 DIAGNOSIS — J90 Pleural effusion, not elsewhere classified: Secondary | ICD-10-CM | POA: Diagnosis not present

## 2024-05-15 DIAGNOSIS — Z7901 Long term (current) use of anticoagulants: Secondary | ICD-10-CM

## 2024-05-15 DIAGNOSIS — I9589 Other hypotension: Secondary | ICD-10-CM | POA: Diagnosis present

## 2024-05-15 DIAGNOSIS — M1A9XX0 Chronic gout, unspecified, without tophus (tophi): Secondary | ICD-10-CM | POA: Diagnosis present

## 2024-05-15 DIAGNOSIS — I2489 Other forms of acute ischemic heart disease: Secondary | ICD-10-CM | POA: Diagnosis present

## 2024-05-15 DIAGNOSIS — Z79899 Other long term (current) drug therapy: Secondary | ICD-10-CM | POA: Diagnosis not present

## 2024-05-15 DIAGNOSIS — R531 Weakness: Secondary | ICD-10-CM

## 2024-05-15 DIAGNOSIS — Z4901 Encounter for fitting and adjustment of extracorporeal dialysis catheter: Secondary | ICD-10-CM

## 2024-05-15 DIAGNOSIS — E876 Hypokalemia: Secondary | ICD-10-CM | POA: Diagnosis not present

## 2024-05-15 DIAGNOSIS — E785 Hyperlipidemia, unspecified: Secondary | ICD-10-CM | POA: Diagnosis present

## 2024-05-15 DIAGNOSIS — N185 Chronic kidney disease, stage 5: Secondary | ICD-10-CM | POA: Diagnosis not present

## 2024-05-15 DIAGNOSIS — G4733 Obstructive sleep apnea (adult) (pediatric): Secondary | ICD-10-CM | POA: Diagnosis present

## 2024-05-15 DIAGNOSIS — R571 Hypovolemic shock: Secondary | ICD-10-CM | POA: Diagnosis present

## 2024-05-15 DIAGNOSIS — R34 Anuria and oliguria: Secondary | ICD-10-CM | POA: Diagnosis present

## 2024-05-15 DIAGNOSIS — K529 Noninfective gastroenteritis and colitis, unspecified: Secondary | ICD-10-CM | POA: Diagnosis present

## 2024-05-15 DIAGNOSIS — Z8546 Personal history of malignant neoplasm of prostate: Secondary | ICD-10-CM

## 2024-05-15 LAB — COMPREHENSIVE METABOLIC PANEL WITH GFR
ALT: 49 U/L — ABNORMAL HIGH (ref 0–44)
AST: 98 U/L — ABNORMAL HIGH (ref 15–41)
Albumin: 2.3 g/dL — ABNORMAL LOW (ref 3.5–5.0)
Alkaline Phosphatase: 187 U/L — ABNORMAL HIGH (ref 38–126)
Anion gap: 19 — ABNORMAL HIGH (ref 5–15)
BUN: 117 mg/dL — ABNORMAL HIGH (ref 8–23)
CO2: 14 mmol/L — ABNORMAL LOW (ref 22–32)
Calcium: 8.2 mg/dL — ABNORMAL LOW (ref 8.9–10.3)
Chloride: 109 mmol/L (ref 98–111)
Creatinine, Ser: 11.33 mg/dL — ABNORMAL HIGH (ref 0.61–1.24)
GFR, Estimated: 5 mL/min — ABNORMAL LOW (ref >60)
Glucose, Bld: 102 mg/dL — ABNORMAL HIGH (ref 70–99)
Potassium: 5 mmol/L (ref 3.5–5.1)
Sodium: 142 mmol/L (ref 135–145)
Total Bilirubin: 1.2 mg/dL (ref 0.0–1.2)
Total Protein: 6.1 g/dL — ABNORMAL LOW (ref 6.5–8.1)

## 2024-05-15 LAB — BASIC METABOLIC PANEL WITH GFR
Anion gap: 15 (ref 5–15)
BUN: 114 mg/dL — ABNORMAL HIGH (ref 8–23)
CO2: 15 mmol/L — ABNORMAL LOW (ref 22–32)
Calcium: 7.8 mg/dL — ABNORMAL LOW (ref 8.9–10.3)
Chloride: 113 mmol/L — ABNORMAL HIGH (ref 98–111)
Creatinine, Ser: 11.02 mg/dL — ABNORMAL HIGH (ref 0.61–1.24)
GFR, Estimated: 5 mL/min — ABNORMAL LOW (ref >60)
Glucose, Bld: 103 mg/dL — ABNORMAL HIGH (ref 70–99)
Potassium: 5.1 mmol/L (ref 3.5–5.1)
Sodium: 143 mmol/L (ref 135–145)

## 2024-05-15 LAB — TROPONIN I (HIGH SENSITIVITY)
Troponin I (High Sensitivity): 24 ng/L — ABNORMAL HIGH (ref <18)
Troponin I (High Sensitivity): 26 ng/L — ABNORMAL HIGH (ref <18)

## 2024-05-15 LAB — MAGNESIUM: Magnesium: 2.4 mg/dL (ref 1.7–2.4)

## 2024-05-15 LAB — URINALYSIS, ROUTINE W REFLEX MICROSCOPIC
Bilirubin Urine: NEGATIVE
Glucose, UA: NEGATIVE mg/dL
Hgb urine dipstick: NEGATIVE
Ketones, ur: NEGATIVE mg/dL
Leukocytes,Ua: NEGATIVE
Nitrite: NEGATIVE
Protein, ur: NEGATIVE mg/dL
Specific Gravity, Urine: 1.015 (ref 1.005–1.030)
pH: 5 (ref 5.0–8.0)

## 2024-05-15 LAB — CBC
HCT: 36.4 % — ABNORMAL LOW (ref 39.0–52.0)
Hemoglobin: 11.4 g/dL — ABNORMAL LOW (ref 13.0–17.0)
MCH: 32.2 pg (ref 26.0–34.0)
MCHC: 31.3 g/dL (ref 30.0–36.0)
MCV: 102.8 fL — ABNORMAL HIGH (ref 80.0–100.0)
Platelets: 186 K/uL (ref 150–400)
RBC: 3.54 MIL/uL — ABNORMAL LOW (ref 4.22–5.81)
RDW: 24.2 % — ABNORMAL HIGH (ref 11.5–15.5)
WBC: 13.3 K/uL — ABNORMAL HIGH (ref 4.0–10.5)
nRBC: 0 % (ref 0.0–0.2)

## 2024-05-15 LAB — SARS CORONAVIRUS 2 BY RT PCR: SARS Coronavirus 2 by RT PCR: NEGATIVE

## 2024-05-15 LAB — BLOOD GAS, VENOUS

## 2024-05-15 LAB — LACTIC ACID, PLASMA: Lactic Acid, Venous: 1.5 mmol/L (ref 0.5–1.9)

## 2024-05-15 LAB — MRSA NEXT GEN BY PCR, NASAL: MRSA by PCR Next Gen: NOT DETECTED

## 2024-05-15 LAB — BRAIN NATRIURETIC PEPTIDE: B Natriuretic Peptide: 163.8 pg/mL — ABNORMAL HIGH (ref 0.0–100.0)

## 2024-05-15 LAB — NA AND K (SODIUM & POTASSIUM), RAND UR
Potassium Urine: 32 mmol/L
Sodium, Ur: 34 mmol/L

## 2024-05-15 LAB — CREATININE, URINE, RANDOM: Creatinine, Urine: 171 mg/dL

## 2024-05-15 MED ORDER — IPRATROPIUM-ALBUTEROL 0.5-2.5 (3) MG/3ML IN SOLN: 3.0000 mL | RESPIRATORY_TRACT | Status: DC | PRN

## 2024-05-15 MED ORDER — DOCUSATE SODIUM 100 MG PO CAPS: 100.0000 mg | ORAL_CAPSULE | Freq: Two times a day (BID) | ORAL | Status: DC | PRN

## 2024-05-15 MED ORDER — NOREPINEPHRINE 4 MG/250ML-% IV SOLN
0.0000 ug/min | INTRAVENOUS | Status: DC
  Administered 2024-05-15: 3 ug/min via INTRAVENOUS
  Administered 2024-05-16: 8 ug/min via INTRAVENOUS
  Filled 2024-05-15 (×4): qty 250

## 2024-05-15 MED ORDER — HEPARIN SODIUM (PORCINE) 5000 UNIT/ML IJ SOLN
5000.0000 [IU] | Freq: Three times a day (TID) | INTRAMUSCULAR | Status: DC
  Administered 2024-05-15 – 2024-05-18 (×8): 5000 [IU] via SUBCUTANEOUS
  Filled 2024-05-15 (×9): qty 1

## 2024-05-15 MED ORDER — SODIUM BICARBONATE 8.4 % IV SOLN: 100.0000 meq | Freq: Once | INTRAVENOUS | Status: DC

## 2024-05-15 MED ORDER — CLOPIDOGREL BISULFATE 75 MG PO TABS: 75.0000 mg | ORAL_TABLET | Freq: Every day | ORAL | Status: DC

## 2024-05-15 MED ORDER — SODIUM CHLORIDE 0.9 % IV BOLUS
1000.0000 mL | Freq: Once | INTRAVENOUS | Status: AC
  Administered 2024-05-15: 1000 mL via INTRAVENOUS

## 2024-05-15 MED ORDER — SODIUM CHLORIDE 0.9 % IV SOLN
2.0000 g | Freq: Once | INTRAVENOUS | Status: AC
  Administered 2024-05-15: 2 g via INTRAVENOUS
  Filled 2024-05-15: qty 20

## 2024-05-15 MED ORDER — SODIUM CHLORIDE 0.9 % IV SOLN
250.0000 mL | INTRAVENOUS | Status: AC
  Administered 2024-05-15: 250 mL via INTRAVENOUS

## 2024-05-15 MED ORDER — STERILE WATER FOR INJECTION IV SOLN
INTRAVENOUS | Status: AC
  Filled 2024-05-15 (×4): qty 1000

## 2024-05-15 MED ORDER — SODIUM BICARBONATE 8.4 % IV SOLN
50.0000 meq | Freq: Once | INTRAVENOUS | Status: AC
  Administered 2024-05-15: 50 meq via INTRAVENOUS
  Filled 2024-05-15: qty 50

## 2024-05-15 MED ORDER — POLYETHYLENE GLYCOL 3350 17 G PO PACK: 17.0000 g | PACK | Freq: Every day | ORAL | Status: DC | PRN

## 2024-05-15 MED ORDER — PANTOPRAZOLE SODIUM 40 MG PO TBEC
40.0000 mg | DELAYED_RELEASE_TABLET | Freq: Every day | ORAL | Status: DC
  Administered 2024-05-16 – 2024-05-24 (×9): 40 mg via ORAL
  Filled 2024-05-15 (×9): qty 1

## 2024-05-15 MED ORDER — CHLORHEXIDINE GLUCONATE CLOTH 2 % EX PADS
6.0000 | MEDICATED_PAD | Freq: Every day | CUTANEOUS | Status: DC
  Administered 2024-05-15: 6 via TOPICAL
  Filled 2024-05-15: qty 6

## 2024-05-15 NOTE — H&P (Cosign Needed Addendum)
 NAME:  Kevin Gross, MRN:  [id number], DOB:  04/30/1956, LOS: 0 ADMISSION DATE:  05/15/2024, CONSULTATION DATE:  05/15/24 REFERRING MD:  Dr. Willo, CHIEF COMPLAINT:  Shortness of Breath   History of Present Illness:  68 yo M presenting to [facility] ED from home for evaluation of SOB and progressive weakness.  History obtained per chart review and patient bedside report. Patient reports progressive weakness/ dizziness, decreased urinary output and poor PO intake over the last 2 weeks. This with correlating, multiple episodes of nausea/vomiting and diarrhea until 1-2 days ago (9/15-9/16). He denies noting any blood in his emesis or stool. He also endorses RLQ mild abdominal tenderness as well as increasing abdominal distension. He has also noted dyspnea with exertion over the last month or two. He denies chest pain, new cough, recent falls, LOC, blurred vision, dysuria. He reports his HR is chronically in the 40's-50's, which does correlate with my chart review.  Of note upon arrival in ED he denied any GI symptoms or new distension, also reporting his UOP as normal. Daughter reported to EDP that the patient has continued to drink fluids but has not eaten very much.  He reported continued abstinence from alcohol and tobacco products, he denied recreational drug use. And he is aware he should not use NSAID medications.  ED course: Upon arrival, patient alert and responsive, with chronic bradycardia and acutely soft hemodynamics. Labs significant for AGMA, acute renal failure, Transaminitis, mildly elevated BNP and mildly elevated but flat troponin suggestive of slight demand ischemia with leukocytosis. Imaging revealed small bilateral pleural effusions and small ascites with small ascites. Patient became hypotensive which responded to IVF bolus, then dropped again facilitating the initiation of peripheral vasopressor support. Medications given: 3 L IVF bolus, levophed  drip started  Initial Vitals: 97.8,  16, 54, 91/51 & 99% on RA Significant labs: (Labs/ Imaging personally reviewed) I, Jenita Ruth Rust-Chester, AGACNP-BC, personally viewed and interpreted this ECG. EKG Interpretation: Date: 05/15/24, EKG Time: 15:58, Rate: 51, Rhythm: SB vs ectopic, QRS Axis:  normal, Intervals: prolonged Qtc, ST/T Wave abnormalities: non specific T wave abnormalities, Narrative Interpretation: SB vs ectopic rhythm with prolonged Qt Chemistry: Na+: 142, K+: 5.0, BUN/Cr.: 117/11.33, Serum CO2/ AG: 14/ 19, Alk Phos: 187, albumin : 2.3, AST/ALT: 98/49, T.Bili: 1.2 Hematology: WBC: 13.3, Hgb: 11.4,  Troponin: 24, BNP: 163.8, Lactic: pending,  COVID-19 & Influenza A/B: pending VBG: pending UA: pending CXR 05/15/24: small bilateral pleural effusions Renal US  05/15/24:  Mild parenchyma atrophy and increased echogenicity. No hydronephrosis or shadowing stone. Probable small bilateral renal cysts. Small ascites.  PCCM consulted for admission due to circulatory shock requiring vasopressor support in the setting of AKI on CKD Stage 4.  Pertinent  Medical History  CKD Stage 4 Alcoholic Liver Cirrhosis COPD HLD HTN PAD OSA on CPAP Prostate Cancer Gout Prior ETOH abuse Iron  Deficiency Anemia Former smoker Bradycardia HFpEF (02/2024: LVEF 60-65%, G2DD)  Significant Hospital Events: Including procedures, antibiotic start and stop dates in addition to other pertinent events   05/15/24: Admit patient to ICU due to circulatory shock s/t unclear etiology in the setting of AKI on CKD stage 4 requiring vasopressor support.  Interim History / Subjective:  Patient alert and oriented, plan of care discussed, all questions and concerns answered at this time.  Objective    Blood pressure (!) 99/42, pulse (!) 52, temperature 97.8 F (36.6 C), resp. rate 18, height 5' 9 (1.753 m), weight 116.6 kg, SpO2 100%.       No  intake or output data in the 24 hours ending 05/15/24 1927 Filed Weights   05/15/24 1518  Weight: 116.6  kg    Examination: General: Adult male, acutely ill, lying in bed, NAD HEENT: MM pink/moist, anicteric, atraumatic, neck supple Neuro: A&O x 4, able to follow commands, PERRL +3, MAE CV: s1s2 RRR, SB on monitor, no r/m/g Pulm: Regular, non labored on RA , breath sounds clear-BUL & diminished-BLL GI: soft, distended, mild RLQ tender, bs x 4 Skin: LLE localized discoloration (chronic per patient), scatted excoriation and scabbed abrasions on bilateral hands/arms Extremities: warm/dry, pulses + 2 R/P, trace edema noted BLE  Resolved problem list   Assessment and Plan  Circulatory Shock s/t hypovolemia +/- sepsis? Initial interventions/workup included: 3 L of NS/LR - STAT lactic, VBG - CT abdomen/pelvis wo contrast - GI panel/ C. Diff testing ordered if diarrhea continues - Daily CBC, monitor WBC/ fever curve - consider empiric dose of Rocephin  to cover intra-abdominal sources vs sepsis work up depending on lactic (patient currently does not meet SIRS/sepsis criteria) low threshold for antibiotics - Continue vasopressors to maintain MAP< 65 or SBP > 90  Acute Kidney Injury superimposed on CKD Stage 4 AGMA Query HRS? Prior baseline Cr: 1.5-1.7, most recent Cr: 2.62 (02/2024),  Cr on admission: 11.33 Renal US  negative for hydronephrosis - Strict I/O's: alert provider if UOP < 0.5 mL/kg/hr - gentle IVF hydration, consider sodium bicarbonate  supplementation - STAT BMP, followed by Daily BMP, replace electrolytes PRN - Avoid nephrotoxic agents as able, ensure adequate renal perfusion - Nephrology following, appreciate input  - Obtain urine lytes  Acute on Chronic HFpEF Chronic Bradycardia Prolonged Qt PMHx: HTN, PAD, PAF, HLD - Continuous cardiac monitoring  - serial EKG - hold amiodarone  due to bradycardia in the low 40's with prolonged Qtc, consider restarting as patient stabilizes - hold chlorthalidone  & lisinopril  due to hypotension - hold simvastatin  & Zetia  due to  Transaminitis - continue Plavix  - Daily weights to assess volume status - Diurese with the use of IV lasix  as renal function and hemodynamics allow  Transaminitis  Alcoholic Cirrhosis PMHx: prior ETOH abuse - Trend hepatic function - f/u CT abdomen/pelvis - avoid hepatotoxic agents  COPD OSA on CPAP - CPAP at bedtime - bronchodilators PRN  Gout - allopurinol  on hold due to renal function, restart as able  Labs   CBC: Recent Labs  Lab 05/15/24 1528  WBC 13.3*  HGB 11.4*  HCT 36.4*  MCV 102.8*  PLT 186    Basic Metabolic Panel: Recent Labs  Lab 05/15/24 1528  NA 142  K 5.0  CL 109  CO2 14*  GLUCOSE 102*  BUN 117*  CREATININE 11.33*  CALCIUM  8.2*  MG 2.4   GFR: Estimated Creatinine Clearance: 8.1 mL/min (A) (by C-G formula based on SCr of 11.33 mg/dL (H)). Recent Labs  Lab 05/15/24 1528  WBC 13.3*    Liver Function Tests: Recent Labs  Lab 05/15/24 1528  AST 98*  ALT 49*  ALKPHOS 187*  BILITOT 1.2  PROT 6.1*  ALBUMIN  2.3*   No results for input(s): LIPASE, AMYLASE in the last 168 hours. No results for input(s): AMMONIA in the last 168 hours.  ABG    Component Value Date/Time   PHART 7.298 (L) 11/27/2021 0345   PCO2ART 39.7 11/27/2021 0345   PO2ART 116 (H) 11/27/2021 0345   HCO3 19.5 (L) 11/27/2021 0345   TCO2 21 (L) 08/08/2023 0916   ACIDBASEDEF 7.0 (H) 11/27/2021 0345   O2SAT 98  11/27/2021 0345     Coagulation Profile: No results for input(s): INR, PROTIME in the last 168 hours.  Cardiac Enzymes: No results for input(s): CKTOTAL, CKMB, CKMBINDEX, TROPONINI in the last 168 hours.  HbA1C: Hgb A1c MFr Bld  Date/Time Value Ref Range Status  11/27/2021 03:28 AM 5.6 4.8 - 5.6 % Final    Comment:    (NOTE) Pre diabetes:          5.7%-6.4%  Diabetes:              >6.4%  Glycemic control for   <7.0% adults with diabetes     CBG: No results for input(s): GLUCAP in the last 168 hours.  Review of Systems:  positives in BOLD  Gen: Denies fever, chills, weight change, fatigue, night sweats HEENT: Denies blurred vision, double vision, hearing loss, tinnitus, sinus congestion, rhinorrhea, sore throat, neck stiffness, dysphagia PULM: Denies shortness of breath, cough, sputum production, hemoptysis, wheezing CV: Denies chest pain, edema, orthopnea, paroxysmal nocturnal dyspnea, palpitations GI: Denies abdominal pain, nausea, vomiting, diarrhea, hematochezia, melena, constipation, change in bowel habits GU: Denies dysuria, hematuria, polyuria, oliguria, urethral discharge Endocrine: Denies hot or cold intolerance, polyuria, polyphagia or appetite change Derm: Denies rash, dry skin, scaling or peeling skin change Heme: Denies easy bruising, bleeding, bleeding gums Neuro: Denies headache, numbness, weakness, slurred speech, loss of memory or consciousness  Past Medical History:  He,  has a past medical history of Alcoholic cirrhosis of liver (HCC), Anemia (2024), Cataract (2024), CHF (congestive heart failure) (HCC), Chronic gouty arthritis, CKD (chronic kidney disease), stage III (HCC), Clotting disorder (HCC), Complication of anesthesia, COPD (chronic obstructive pulmonary disease) (HCC), Hyperlipidemia, Hypertension, Hyperuricemia, Peripheral arterial disease (HCC), Pre-diabetes, Prostate cancer (HCC) (2023), and Sleep apnea.   Surgical History:   Past Surgical History:  Procedure Laterality Date   ABDOMINAL AORTOGRAM W/LOWER EXTREMITY N/A 10/20/2021   Procedure: ABDOMINAL AORTOGRAM W/LOWER EXTREMITY;  Surgeon: Darron Deatrice LABOR, MD;  Location: [facility];  Service: Cardiovascular;  Laterality: N/A;   ABDOMINAL AORTOGRAM W/LOWER EXTREMITY N/A 08/08/2023   Procedure: ABDOMINAL AORTOGRAM W/LOWER EXTREMITY;  Surgeon: Serene Gaile ORN, MD;  Location: [facility];  Service: Cardiovascular;  Laterality: N/A;   ALLOGRAFT APPLICATION Bilateral 12/22/2021   Procedure: ALLOGRAFT APPLICATION;   Surgeon: Serene Gaile ORN, MD;  Location: [facility] OR;  Service: Vascular;  Laterality: Bilateral;   ANGIOPLASTY Left 11/26/2021   Procedure: LEFT INFRARENAL LITHOTRIPSY;  Surgeon: Serene Gaile ORN, MD;  Location: [facility] OR;  Service: Vascular;  Laterality: Left;   APPLICATION OF WOUND VAC Bilateral 12/13/2021   Procedure: APPLICATION OF WOUND VAC;  Surgeon: Lanis Fonda BRAVO, MD;  Location[facility] OR;  Service: Vascular;  Laterality: Bilateral;   CARDIAC CATHETERIZATION     [facility]   CATARACT EXTRACTION W/PHACO Left 04/04/2023   Procedure: CATARACT EXTRACTION PHACO AND INTRAOCULAR LENS PLACEMENT (IOC) LEFT 8.71 00:45.5;  Surgeon: Jaye Fallow, MD;  Location: [facility];  Service: Ophthalmology;  Laterality: Left;   CATARACT EXTRACTION W/PHACO Right 04/18/2023   Procedure: CATARACT EXTRACTION PHACO AND INTRAOCULAR LENS PLACEMENT (IOC) RIGHT 4.31 00:8.2;  Surgeon: Jaye Fallow, MD;  Location: [facility];  Service: Ophthalmology;  Laterality: Right;   COLONOSCOPY     COLONOSCOPY WITH PROPOFOL  N/A 01/17/2018   Procedure: COLONOSCOPY WITH PROPOFOL ;  Surgeon: Toledo, Ladell POUR, MD;  Location: [facility];  Service: Gastroenterology;  Laterality: N/A;   COLONOSCOPY WITH PROPOFOL  N/A 09/12/2022   Procedure: COLONOSCOPY WITH PROPOFOL ;  Surgeon: Maryruth Ole DASEN, MD;  Location: [facility];  Service: Endoscopy;  Laterality: N/A;   COLONOSCOPY WITH PROPOFOL  N/A 09/05/2023   Procedure: COLONOSCOPY WITH PROPOFOL ;  Surgeon: Maryruth Ole DASEN, MD;  Location: [facility];  Service: Endoscopy;  Laterality: N/A;   ENDARTERECTOMY FEMORAL Bilateral 11/26/2021   Procedure: BILATERAL FEMORAL ENDARTERECTOMY WITH VEIN PATCH ANGIOPLASTY;  Surgeon: Serene Gaile ORN, MD;  Location: [facility] OR;  Service: Vascular;  Laterality: Bilateral;   ENTEROSCOPY N/A 09/05/2023   Procedure: ENTEROSCOPY;  Surgeon: Maryruth Ole DASEN, MD;  Location: [facility];  Service: Endoscopy;  Laterality: N/A;    ESOPHAGOGASTRODUODENOSCOPY (EGD) WITH PROPOFOL  N/A 01/17/2018   Procedure: ESOPHAGOGASTRODUODENOSCOPY (EGD) WITH PROPOFOL ;  Surgeon: Toledo, Ladell POUR, MD;  Location: [facility];  Service: Gastroenterology;  Laterality: N/A;   ESOPHAGOGASTRODUODENOSCOPY (EGD) WITH PROPOFOL  N/A 09/12/2022   Procedure: ESOPHAGOGASTRODUODENOSCOPY (EGD) WITH PROPOFOL ;  Surgeon: Maryruth Ole DASEN, MD;  Location: [facility];  Service: Endoscopy;  Laterality: N/A;   EYE SURGERY     GROIN DEBRIDEMENT Bilateral 12/16/2021   Procedure: IRRIGATION AND DEBRIDEMENT OF BILATERAL GROINS;  Surgeon: Serene Gaile ORN, MD;  Location: [facility] OR;  Service: Vascular;  Laterality: Bilateral;   INCISION AND DRAINAGE OF WOUND Bilateral 12/13/2021   Procedure: IRRIGATION AND DEBRIDEMENT OF BILATERAL GROIN WOUNDS;  Surgeon: Lanis Fonda BRAVO, MD;  Location: [facility] OR;  Service: Vascular;  Laterality: Bilateral;   INSERTION OF ILIAC STENT Bilateral 11/26/2021   Procedure: BILATERAL ILIAC STENTING USING 66mmX80mm INNOVA STENT ON LEFT ILIAC AND 60mmX59mm, 69mmX79mm VBX AND 73mmX7.5cm VIABAHN STENT ON RIGHT ILIAC;  Surgeon: Serene Gaile ORN, MD;  Location: [facility] OR;  Service: Vascular;  Laterality: Bilateral;   INSERTION OF SEEDS IN PROSTATE     ORIF ANKLE FRACTURE Right    PERIPHERAL INTRAVASCULAR LITHOTRIPSY  08/08/2023   Procedure: PERIPHERAL INTRAVASCULAR LITHOTRIPSY;  Surgeon: Serene Gaile ORN, MD;  Location: [facility];  Service: Cardiovascular;;   PROSTATE BIOPSY N/A 04/01/2021   Procedure: PROSTATE BIOPSY GRAYCE;  Surgeon: Kassie Ozell SAUNDERS, MD;  Location: [facility] ORS;  Service: Urology;  Laterality: N/A;   VEIN HARVEST Bilateral 11/26/2021   Procedure: VEIN HARVEST OF BILATERAL SPAHENOUS VEINS;  Surgeon: Serene Gaile ORN, MD;  Location: [facility];  Service: Vascular;  Laterality: Bilateral;   WOUND DEBRIDEMENT Bilateral 12/22/2021   Procedure: INCISION AND DEBRIDEMENT OF BILATERAL GROINS;  Surgeon: Serene Gaile ORN, MD;  Location: [facility];   Service: Vascular;  Laterality: Bilateral;     Social History:   reports that he quit smoking about 9 years ago. His smoking use included cigarettes. He started smoking about 56 years ago. He has a 47 pack-year smoking history. He has never been exposed to tobacco smoke. He has never used smokeless tobacco. He reports that he does not currently use alcohol. He reports that he does not use drugs.   Family History:  His family history includes Heart disease in his brother and mother.   Allergies Allergies  Allergen Reactions   Jardiance [Empagliflozin]     Loss of balance, hypoglycemic      Home Medications  Prior to Admission medications   Medication Sig Start Date End Date Taking? Authorizing Provider  allopurinol  (ZYLOPRIM ) 100 MG tablet Take 100 mg by mouth daily.    [provider]  allopurinol  (ZYLOPRIM ) 300 MG tablet Take 600 mg by mouth daily. 02/23/24   [provider]  amiodarone  (PACERONE ) 200 MG tablet Take 200 mg by mouth 2 (two) times daily.    [provider]  aspirin  EC 81 MG  tablet Take 81 mg by mouth every morning.    [provider]  atenolol  (TENORMIN ) 25 MG tablet Take 25 mg by mouth daily. Patient not taking: Reported on 04/16/2024    [provider]  chlorthalidone  (HYGROTON ) 25 MG tablet Take 25 mg by mouth daily.    [provider]  Cholecalciferol  (VITAMIN D3) 25 MCG (1000 UT) CAPS Take 2,000 Units by mouth daily.    [provider]  clopidogrel  (PLAVIX ) 75 MG tablet Take 1 tablet (75 mg total) by mouth daily. 01/16/23   Baglia, Corrina, PA-C  ezetimibe  (ZETIA ) 10 MG tablet Take 1 tablet (10 mg total) by mouth daily. 04/10/18   Gollan, Timothy J, MD  iron  polysaccharides (NIFEREX) 150 MG capsule Take 1 capsule (150 mg total) by mouth 2 (two) times daily. 03/11/24 06/09/24  Jhonny Calvin NOVAK, MD  lisinopril  (ZESTRIL ) 40 MG tablet Take 40 mg by mouth daily.    [provider]  simvastatin  (ZOCOR )  20 MG tablet Take 20 mg by mouth in the morning.    [provider]  triamcinolone  ointment (KENALOG ) 0.1 % Apply 1 Application topically 2 (two) times daily as needed (psoriasis). 07/18/23   [provider]  zolpidem  (AMBIEN ) 10 MG tablet Take 10 mg by mouth at bedtime.    [provider]     Critical care time: 69 minutes     Jenita Jama Meek, AGACNP-BC Acute Care Nurse Practitioner [facility]   [phone] / [phone] Please see Amion for details.

## 2024-05-15 NOTE — Progress Notes (Signed)
 eLink Physician-Brief Progress Note Patient Name: CHRISOPHER PUSTEJOVSKY DOB: June 26, 1956 MRN: [id number]   Date of Service  05/15/2024  HPI/Events of Note  68 year old male with a history of metabolic syndrome, heart failure, CKD, PAD and alcoholic cirrhosis who presents with dyspnea and shock requiring vasopressor infusion.  Vitals show bradycardia but otherwise normal vitals saturating 100% on room air.  Currently on norepinephrine  infusion.  Results consistent with anion gap metabolic acidosis, transaminitis, acute kidney injury and leukocytosis.  Radiographically fluid overloaded.  Slow A-fib.  eICU Interventions  Status post 3 L crystalloid infusion, maintain vasopressor infusions for MAP greater than 65  Anticipate RRT if renal function does not improve  Anticipate empiric antibiotics  Consider ultrasound of lower extremities ABG  DVT prophylaxis with heparin  GI prophylaxis not indicated     Intervention Category Evaluation Type: New Patient Evaluation  Anikin Prosser 05/15/2024, 9:35 PM

## 2024-05-15 NOTE — ED Provider Notes (Signed)
 [facility] Provider Note    Event Date/Time   First MD Initiated Contact with Patient 05/15/24 1531     (approximate)   History   Chief Complaint Shortness of Breath   HPI  Kevin Gross is a 68 y.o. male with past medical history of hypertension, hyperlipidemia, CHF, CKD, iron  deficiency anemia, PAD, alcohol abuse, cirrhosis who presents to the ED complaining of shortness of breath.  Patient reports that he has been feeling increasingly weak in general with lightheadedness and shortness of breath over the past 7 days.  He denies any fevers or cough and has not had any pain in his chest.  He does have chronic swelling in his legs and abdomen that is unchanged today.  Daughter at bedside reports that he has continued to drink fluids but does not eat very much.  He reports that his urine output has been normal and he does not have any dysuria.     Physical Exam   Triage Vital Signs: ED Triage Vitals [05/15/24 1518]  Encounter Vitals Group     BP (!) 95/50     Girls Systolic BP Percentile      Girls Diastolic BP Percentile      Boys Systolic BP Percentile      Boys Diastolic BP Percentile      Pulse Rate 60     Resp 14     Temp 97.8 F (36.6 C)     Temp src      SpO2 100 %     Weight 257 lb (116.6 kg)     Height 5' 9 (1.753 m)     Head Circumference      Peak Flow      Pain Score      Pain Loc      Pain Education      Exclude from Growth Chart     Most recent vital signs: Vitals:   05/15/24 1700 05/15/24 1927  BP: (!) 99/42 (!) 86/71  Pulse: (!) 52 (!) 51  Resp: 18 14  Temp:  97.6 F (36.4 C)  SpO2: 100% 100%    Constitutional: Alert and oriented. Eyes: Conjunctivae are normal. Head: Atraumatic. Nose: No congestion/rhinnorhea. Mouth/Throat: Mucous membranes are moist.  Cardiovascular: Bradycardic, irregularly irregular rhythm. Grossly normal heart sounds.  2+ radial pulses bilaterally. Respiratory: Normal respiratory effort.   No retractions. Lungs CTAB. Gastrointestinal: Soft and nontender. No distention. Musculoskeletal: No lower extremity tenderness, 1+ pitting edema to mid shins bilaterally. Neurologic:  Normal speech and language. No gross focal neurologic deficits are appreciated.    ED Results / Procedures / Treatments   Labs (all labs ordered are listed, but only abnormal results are displayed) Labs Reviewed  CBC - Abnormal; Notable for the following components:      Result Value   WBC 13.3 (*)    RBC 3.54 (*)    Hemoglobin 11.4 (*)    HCT 36.4 (*)    MCV 102.8 (*)    RDW 24.2 (*)    All other components within normal limits  COMPREHENSIVE METABOLIC PANEL WITH GFR - Abnormal; Notable for the following components:   CO2 14 (*)    Glucose, Bld 102 (*)    BUN 117 (*)    Creatinine, Ser 11.33 (*)    Calcium  8.2 (*)    Total Protein 6.1 (*)    Albumin  2.3 (*)    AST 98 (*)    ALT 49 (*)  Alkaline Phosphatase 187 (*)    GFR, Estimated 5 (*)    Anion gap 19 (*)    All other components within normal limits  BRAIN NATRIURETIC PEPTIDE - Abnormal; Notable for the following components:   B Natriuretic Peptide 163.8 (*)    All other components within normal limits  TROPONIN I (HIGH SENSITIVITY) - Abnormal; Notable for the following components:   Troponin I (High Sensitivity) 24 (*)    All other components within normal limits  MAGNESIUM   URINALYSIS, ROUTINE W REFLEX MICROSCOPIC  PROTEIN / CREATININE RATIO, URINE  TROPONIN I (HIGH SENSITIVITY)     EKG  ED ECG REPORT I, Carlin Palin, the attending physician, personally viewed and interpreted this ECG.   Date: 05/15/2024  EKG Time: 15:27  Rate: 53  Rhythm: atrial fibrillation  Axis: Normal  Intervals:none  ST&T Change: None  RADIOLOGY Chest x-ray reviewed and interpreted by me with no infiltrate, edema, or effusion.  PROCEDURES:  Critical Care performed: Yes, see critical care procedure note(s)  .Critical Care  Performed  by: Palin Carlin, MD Authorized by: Palin Carlin, MD   Critical care provider statement:    Critical care time (minutes):  30   Critical care time was exclusive of:  Separately billable procedures and treating other patients and teaching time   Critical care was necessary to treat or prevent imminent or life-threatening deterioration of the following conditions:  Shock and renal failure   Critical care was time spent personally by me on the following activities:  Development of treatment plan with patient or surrogate, discussions with consultants, evaluation of patient's response to treatment, examination of patient, ordering and review of laboratory studies, ordering and review of radiographic studies, ordering and performing treatments and interventions, pulse oximetry, re-evaluation of patient's condition and review of old charts   I assumed direction of critical care for this patient from another provider in my specialty: no     Care discussed with: admitting provider      MEDICATIONS ORDERED IN ED: Medications  sodium chloride  0.9 % bolus 1,000 mL (has no administration in time range)  0.9 %  sodium chloride  infusion (has no administration in time range)  norepinephrine  (LEVOPHED ) 4mg  in (0.016 mg/mL) premix infusion (4 mcg/min Intravenous Rate/Dose Change 05/15/24 1929)  sodium chloride  0.9 % bolus 1,000 mL (1,000 mLs Intravenous New Bag/Given 05/15/24 1611)  sodium chloride  0.9 % bolus 1,000 mL (1,000 mLs Intravenous New Bag/Given 05/15/24 1714)     IMPRESSION / MDM / ASSESSMENT AND PLAN / ED COURSE  I reviewed the triage vital signs and the nursing notes.                              68 y.o. male with past medical history of hypertension, hyperlipidemia, CHF, PAD, CKD, alcohol abuse, cirrhosis, and iron  deficiency anemia presents to the ED with generalized weakness for the past 7 days with shortness of breath and lightheadedness.  Patient's presentation is most  consistent with acute presentation with potential threat to life or bodily function.  Differential diagnosis includes, but is not limited to, arrhythmia, ACS, anemia, electrolyte abnormality, AKI, sepsis, UTI, pneumonia.  Patient chronically ill appearing but in no acute distress, vital signs remarkable for borderline bradycardia and hypotension.  EKG appears to show atrial fibrillation with slow rate, no ischemic changes noted.  Patient is not on any AV nodal blocking medication and with his heart rate in the 50s, I  doubt this is the primary driver of his hypotension.  We will give IV fluid bolus, no other vital sign abnormality to suggest sepsis.  Lab results are pending, will also check chest x-ray and urinalysis to assess for infection.  Chest x-ray is unremarkable, labs with mild leukocytosis but no significant anemia.  Patient does have acute renal failure with creatinine of over 11 and BUN of 117, but potassium and other electrolytes are within normal limits.  Case discussed with Dr. Dennise of nephrology, recommends renal ultrasound that is unremarkable.  Patient remains hypotensive despite 2 L of IV fluids, will give additional liter of fluids and start on Levophed .  Case discussed with ICU team for admission.      FINAL CLINICAL IMPRESSION(S) / ED DIAGNOSES   Final diagnoses:  Acute renal failure, unspecified acute renal failure type (HCC)  Generalized weakness  Hypotension, unspecified hypotension type     Rx / DC Orders   ED Discharge Orders     None        Note:  This document was prepared using Dragon voice recognition software and may include unintentional dictation errors.   Willo Dunnings, MD 05/15/24 [phone]

## 2024-05-15 NOTE — ED Triage Notes (Signed)
 C/o SOB and dizziness that started a week ago. Pt reports decreased appetite, bilateral tremors x1 month. PMH: CKD, anemia, PAD. GCS 15

## 2024-05-15 NOTE — ED Notes (Signed)
 Attempted report. Per unit secretary room has not been assigned to a RN yet. Asked to call back in about 15 mins.

## 2024-05-16 ENCOUNTER — Inpatient Hospital Stay

## 2024-05-16 ENCOUNTER — Other Ambulatory Visit: Payer: Self-pay

## 2024-05-16 ENCOUNTER — Inpatient Hospital Stay
Admit: 2024-05-16 | Discharge: 2024-05-16 | Disposition: A | Discharge status: Home / Self Care | Attending: Student in an Organized Health Care Education/Training Program

## 2024-05-16 DIAGNOSIS — I5033 Acute on chronic diastolic (congestive) heart failure: Secondary | ICD-10-CM | POA: Diagnosis not present

## 2024-05-16 DIAGNOSIS — J90 Pleural effusion, not elsewhere classified: Secondary | ICD-10-CM

## 2024-05-16 DIAGNOSIS — A0472 Enterocolitis due to Clostridium difficile, not specified as recurrent: Secondary | ICD-10-CM

## 2024-05-16 LAB — BODY FLUID CELL COUNT WITH DIFFERENTIAL
Eos, Fluid: 0 %
Lymphs, Fluid: 51 %
Monocyte-Macrophage-Serous Fluid: 37 %
Neutrophil Count, Fluid: 12 %
Other Cells, Fluid: 0 %
Total Nucleated Cell Count, Fluid: 30 uL

## 2024-05-16 LAB — GLUCOSE, CAPILLARY
Glucose-Capillary: 80 mg/dL (ref 70–99)
Glucose-Capillary: 120 mg/dL — ABNORMAL HIGH (ref 70–99)

## 2024-05-16 LAB — BASIC METABOLIC PANEL WITH GFR
Anion gap: 16 — ABNORMAL HIGH (ref 5–15)
Anion gap: 14 (ref 5–15)
BUN: 118 mg/dL — ABNORMAL HIGH (ref 8–23)
BUN: 86 mg/dL — ABNORMAL HIGH (ref 8–23)
CO2: 15 mmol/L — ABNORMAL LOW (ref 22–32)
CO2: 19 mmol/L — ABNORMAL LOW (ref 22–32)
Calcium: 7.7 mg/dL — ABNORMAL LOW (ref 8.9–10.3)
Calcium: 7.6 mg/dL — ABNORMAL LOW (ref 8.9–10.3)
Chloride: 111 mmol/L (ref 98–111)
Chloride: 107 mmol/L (ref 98–111)
Creatinine, Ser: 10.51 mg/dL — ABNORMAL HIGH (ref 0.61–1.24)
Creatinine, Ser: 8.16 mg/dL — ABNORMAL HIGH (ref 0.61–1.24)
GFR, Estimated: 5 mL/min — ABNORMAL LOW (ref >60)
GFR, Estimated: 7 mL/min — ABNORMAL LOW (ref >60)
Glucose, Bld: 109 mg/dL — ABNORMAL HIGH (ref 70–99)
Glucose, Bld: 120 mg/dL — ABNORMAL HIGH (ref 70–99)
Potassium: 4.7 mmol/L (ref 3.5–5.1)
Potassium: 4.3 mmol/L (ref 3.5–5.1)
Sodium: 142 mmol/L (ref 135–145)
Sodium: 140 mmol/L (ref 135–145)

## 2024-05-16 LAB — ALBUMIN, PLEURAL OR PERITONEAL FLUID: Albumin, Fluid: <1.5 g/dL

## 2024-05-16 LAB — CBC
HCT: 33.8 % — ABNORMAL LOW (ref 39.0–52.0)
Hemoglobin: 10.8 g/dL — ABNORMAL LOW (ref 13.0–17.0)
MCH: 31.5 pg (ref 26.0–34.0)
MCHC: 32 g/dL (ref 30.0–36.0)
MCV: 98.5 fL (ref 80.0–100.0)
Platelets: 211 K/uL (ref 150–400)
RBC: 3.43 MIL/uL — ABNORMAL LOW (ref 4.22–5.81)
RDW: 24.5 % — ABNORMAL HIGH (ref 11.5–15.5)
WBC: 18.2 K/uL — ABNORMAL HIGH (ref 4.0–10.5)
nRBC: 0 % (ref 0.0–0.2)

## 2024-05-16 LAB — ECHOCARDIOGRAM COMPLETE
AR max vel: 2.25 cm2
AV Area VTI: 2.21 cm2
AV Area mean vel: 2.1 cm2
AV Mean grad: 7 mmHg
AV Peak grad: 12 mmHg
Ao pk vel: 1.73 m/s
Area-P 1/2: 3.7 cm2
Height: 69 in
MV VTI: 2.07 cm2
S' Lateral: 3.1 cm
Weight: 4077.63 [oz_av]

## 2024-05-16 LAB — PHOSPHORUS: Phosphorus: 8.5 mg/dL — ABNORMAL HIGH (ref 2.5–4.6)

## 2024-05-16 LAB — APTT: aPTT: 43 s — ABNORMAL HIGH (ref 24–36)

## 2024-05-16 LAB — MAGNESIUM: Magnesium: 2.5 mg/dL — ABNORMAL HIGH (ref 1.7–2.4)

## 2024-05-16 LAB — PROTEIN / CREATININE RATIO, URINE
Creatinine, Urine: 181 mg/dL
Protein Creatinine Ratio: 0.2 mg/mg{creat} — ABNORMAL HIGH (ref 0.00–0.15)
Total Protein, Urine: 36 mg/dL

## 2024-05-16 LAB — HEPATITIS B SURFACE ANTIGEN: Hepatitis B Surface Ag: NONREACTIVE

## 2024-05-16 LAB — COMPREHENSIVE METABOLIC PANEL WITH GFR
ALT: 47 U/L — ABNORMAL HIGH (ref 0–44)
AST: 95 U/L — ABNORMAL HIGH (ref 15–41)
Albumin: 2.2 g/dL — ABNORMAL LOW (ref 3.5–5.0)
Alkaline Phosphatase: 183 U/L — ABNORMAL HIGH (ref 38–126)
Anion gap: 10 (ref 5–15)
BUN: 115 mg/dL — ABNORMAL HIGH (ref 8–23)
CO2: 16 mmol/L — ABNORMAL LOW (ref 22–32)
Calcium: 7.6 mg/dL — ABNORMAL LOW (ref 8.9–10.3)
Chloride: 117 mmol/L — ABNORMAL HIGH (ref 98–111)
Creatinine, Ser: 10.89 mg/dL — ABNORMAL HIGH (ref 0.61–1.24)
GFR, Estimated: 5 mL/min — ABNORMAL LOW (ref >60)
Glucose, Bld: 104 mg/dL — ABNORMAL HIGH (ref 70–99)
Potassium: 4.8 mmol/L (ref 3.5–5.1)
Sodium: 143 mmol/L (ref 135–145)
Total Bilirubin: 1.3 mg/dL — ABNORMAL HIGH (ref 0.0–1.2)
Total Protein: 5.8 g/dL — ABNORMAL LOW (ref 6.5–8.1)

## 2024-05-16 LAB — BLOOD GAS, VENOUS
Acid-base deficit: 11.3 mmol/L — ABNORMAL HIGH (ref 0.0–2.0)
Bicarbonate: 14.1 mmol/L — ABNORMAL LOW (ref 20.0–28.0)
O2 Saturation: 69.2 %
Patient temperature: 37
pCO2, Ven: 30 mmHg — ABNORMAL LOW (ref 44–60)
pH, Ven: 7.28 (ref 7.25–7.43)
pO2, Ven: 41 mmHg (ref 32–45)

## 2024-05-16 LAB — PROTEIN, PLEURAL OR PERITONEAL FLUID: Total protein, fluid: <3 g/dL

## 2024-05-16 LAB — LACTIC ACID, PLASMA: Lactic Acid, Venous: 1.4 mmol/L (ref 0.5–1.9)

## 2024-05-16 LAB — LACTATE DEHYDROGENASE, PLEURAL OR PERITONEAL FLUID: LD, Fluid: 44 U/L — ABNORMAL HIGH (ref 3–23)

## 2024-05-16 LAB — GLUCOSE, PLEURAL OR PERITONEAL FLUID: Glucose, Fluid: 116 mg/dL

## 2024-05-16 LAB — BETA-HYDROXYBUTYRIC ACID: Beta-Hydroxybutyric Acid: 0.86 mmol/L — ABNORMAL HIGH (ref 0.05–0.27)

## 2024-05-16 LAB — AMMONIA: Ammonia: 67 umol/L — ABNORMAL HIGH (ref 9–35)

## 2024-05-16 LAB — PROTIME-INR
INR: 1.4 — ABNORMAL HIGH (ref 0.8–1.2)
Prothrombin Time: 17.8 s — ABNORMAL HIGH (ref 11.4–15.2)

## 2024-05-16 LAB — AMYLASE, PLEURAL OR PERITONEAL FLUID: Amylase, Fluid: 13 U/L

## 2024-05-16 MED ORDER — NOREPINEPHRINE 16 MG/250ML-% IV SOLN
0.0000 ug/min | INTRAVENOUS | Status: DC
  Administered 2024-05-16: 12 ug/min via INTRAVENOUS
  Administered 2024-05-17: 10 ug/min via INTRAVENOUS
  Filled 2024-05-16 (×2): qty 250

## 2024-05-16 MED ORDER — ORAL CARE MOUTH RINSE: 15.0000 mL | OROMUCOSAL | Status: DC | PRN

## 2024-05-16 MED ORDER — CHLORHEXIDINE GLUCONATE CLOTH 2 % EX PADS: 6.0000 | MEDICATED_PAD | Freq: Every day | CUTANEOUS | Status: DC

## 2024-05-16 MED ORDER — SODIUM CHLORIDE 0.9 % IV SOLN
2.0000 g | INTRAVENOUS | Status: AC
  Administered 2024-05-16 – 2024-05-20 (×5): 2 g via INTRAVENOUS
  Filled 2024-05-16 (×5): qty 20

## 2024-05-16 MED ORDER — VASOPRESSIN 20 UNITS/100 ML INFUSION FOR SHOCK
0.0000 [IU]/min | INTRAVENOUS | Status: DC
  Administered 2024-05-16: 0.04 [IU]/min via INTRAVENOUS
  Administered 2024-05-16: 0.03 [IU]/min via INTRAVENOUS
  Administered 2024-05-17: 0.04 [IU]/min via INTRAVENOUS
  Filled 2024-05-16 (×5): qty 100

## 2024-05-16 MED ORDER — ALBUMIN HUMAN 25 % IV SOLN: 25.0000 g | Freq: Once | INTRAVENOUS | Status: DC

## 2024-05-16 MED ORDER — ALBUMIN HUMAN 25 % IV SOLN
25.0000 g | Freq: Four times a day (QID) | INTRAVENOUS | Status: AC
Start: 2024-05-16 — End: 2024-05-18
  Administered 2024-05-16 – 2024-05-17 (×4): 25 g via INTRAVENOUS
  Administered 2024-05-17: 12.5 g via INTRAVENOUS
  Administered 2024-05-17 – 2024-05-18 (×3): 25 g via INTRAVENOUS
  Filled 2024-05-16 (×8): qty 100

## 2024-05-16 MED ORDER — ALBUMIN HUMAN 25 % IV SOLN
25.0000 g | Freq: Once | INTRAVENOUS | Status: AC
  Administered 2024-05-16: 25 g via INTRAVENOUS
  Filled 2024-05-16: qty 100

## 2024-05-16 MED ORDER — SODIUM CHLORIDE 0.9 % IV SOLN
500.0000 mg | INTRAVENOUS | Status: AC
  Administered 2024-05-16 – 2024-05-20 (×5): 500 mg via INTRAVENOUS
  Filled 2024-05-16 (×5): qty 5

## 2024-05-16 MED ORDER — DEXTROSE 5 % IV SOLN
10.0000 mg | Freq: Every day | INTRAVENOUS | Status: AC
  Administered 2024-05-16 – 2024-05-18 (×3): 10 mg via INTRAVENOUS
  Filled 2024-05-16 (×3): qty 1

## 2024-05-16 MED ORDER — INFLUENZA VAC SPLIT HIGH-DOSE 0.5 ML IM SUSY
0.5000 mL | PREFILLED_SYRINGE | INTRAMUSCULAR | Status: AC
  Administered 2024-05-17: 0.5 mL via INTRAMUSCULAR
  Filled 2024-05-16: qty 0.5

## 2024-05-16 MED ORDER — CHLORHEXIDINE GLUCONATE CLOTH 2 % EX PADS
6.0000 | MEDICATED_PAD | Freq: Every day | CUTANEOUS | Status: DC
  Administered 2024-05-16 – 2024-05-17 (×2): 6 via TOPICAL

## 2024-05-16 MED ORDER — LIDOCAINE HCL (PF) 1 % IJ SOLN
2.0000 mL | Freq: Once | INTRAMUSCULAR | Status: AC
Start: 2024-05-16 — End: 2024-05-16
  Administered 2024-05-16: 2 mL

## 2024-05-16 MED ORDER — LIDOCAINE HCL (PF) 1 % IJ SOLN
10.0000 mL | Freq: Once | INTRAMUSCULAR | Status: AC
  Administered 2024-05-16: 10 mL via INTRADERMAL

## 2024-05-16 NOTE — Plan of Care (Signed)
  Problem: Education: Goal: Knowledge of General Education information will improve Description: Including pain rating scale, medication(s)/side effects and non-pharmacologic comfort measures Outcome: Progressing   Problem: Clinical Measurements: Goal: Respiratory complications will improve Outcome: Progressing Goal: Cardiovascular complication will be avoided Outcome: Progressing   Problem: Pain Managment: Goal: General experience of comfort will improve and/or be controlled Outcome: Progressing   Problem: Nutrition: Goal: Adequate nutrition will be maintained Outcome: Not Progressing

## 2024-05-16 NOTE — Progress Notes (Signed)
 Initial Nutrition Assessment  DOCUMENTATION CODES:   Obesity unspecified  INTERVENTION:   Ensure Plus High Protein po TID with diet advancement, each supplement provides 350 kcal and 20 grams of protein  Magic cup TID with meals with diet advancement, each supplement provides 290 kcal and 9 grams of protein  MVI po daily with diet advancement   Rena-vit po daily with diet advancement   Thiamine  100mg  daily x 7 days with diet advancement   Pt at high refeed risk; recommend monitor potassium, magnesium  and phosphorus labs daily until stable  Daily weights   NUTRITION DIAGNOSIS:   Inadequate oral intake related to acute illness as evidenced by per patient/family report.  GOAL:   Patient will meet greater than or equal to 90% of their needs  MONITOR:   Diet advancement, Labs, Weight trends, I & O's, Skin  REASON FOR ASSESSMENT:   Malnutrition Screening Tool    ASSESSMENT:   68 y/o male with h/o COPD, PAD, CAD, etoh abuse, cirrhosis, OSA, CKD IV, IDA, HLD, CHF, PAF, prostate cancer and gout who is admitted with CHF, shock, bradycardia and AKI.  Met with pt in room today. Pt reports decreased appetite and oral intake for two weeks pta. Pt reports that he has lost ~60lbs recently. Pt reports that he is just not hungry. Pt denies any taste changes or nausea/vomiting but per chart review, pt is noted to have had nausea, vomiting and diarrhea pta. Per chart, pt is down 20lbs(8%) over the past 9 months; this is not significant. Pt currently NPO for paracentesis today. RD discussed with pt the importance of adequate nutrition needed to preserve lean muscle. Pt is agreeable to drink chocolate supplements. RD will add supplements once pt initiated on an oral diet. Pt is at refeed risk. Plan is for possible HD initiation.   Medications reviewed and include: heparin , protonix , albumin , azithromycin , ceftriaxone , levophed , sodium bicarbonate , vasopressin     Labs reviewed: K 4.7 wnl, BUN  118(H), creat 10.51(H), P 8.5(H), Mg 2.5(H), ammonia 67(H) Wbc- 18.2(H), Hgb 10.8(L), Hct 33.8(L)  UOP-   NUTRITION - FOCUSED PHYSICAL EXAM:  Flowsheet Row Most Recent Value  Orbital Region No depletion  Upper Arm Region Mild depletion  Thoracic and Lumbar Region No depletion  Buccal Region No depletion  Temple Region No depletion  Clavicle Bone Region No depletion  Clavicle and Acromion Bone Region No depletion  Scapular Bone Region No depletion  Dorsal Hand No depletion  Patellar Region No depletion  Anterior Thigh Region No depletion  Posterior Calf Region No depletion  Edema (RD Assessment) Moderate  Hair Reviewed  Eyes Reviewed  Mouth Reviewed  Skin Reviewed  Nails Reviewed   Diet Order:   Diet Order             Diet NPO time specified Except for: Sips with Meds  Diet effective now                  EDUCATION NEEDS:   Education needs have been addressed  Skin:  Skin Assessment: Reviewed RN Assessment  Last BM:  pta  Height:   Ht Readings from Last 1 Encounters:  05/15/24 5' 9 (1.753 m)    Weight:   Wt Readings from Last 1 Encounters:  05/16/24 115.6 kg    Ideal Body Weight:  72.7 kg  BMI:  Body mass index is 37.64 kg/m.  Estimated Nutritional Needs:   Kcal:  2300-2600kcal/day  Protein:  115-130g/day  Fluid:  1.9-2.2L/day  Kevin Shams  MS, RD, LDN If unable to be reached, please send secure chat to RD inpatient available from 8:00a-4:00p daily

## 2024-05-16 NOTE — Progress Notes (Signed)
 0800- MD dgayli and Lonell Moose NP notified that patient is on 10 of Levo and SBP/MAP under goal. Verbal order to increase levo past 10mcg/min in PIV.

## 2024-05-16 NOTE — Progress Notes (Signed)
*  PRELIMINARY RESULTS* Echocardiogram 2D Echocardiogram has been performed.  Kevin Gross 05/16/2024, 2:08 PM

## 2024-05-16 NOTE — Procedures (Signed)
 Central Venous Catheter Insertion Procedure Note  Kevin Gross  [id number]  Nov 13, 1955  Date:05/16/24  Time:9:45 AM   Provider Performing:Addley Ballinger KANDICE Moose   Procedure: Insertion of Non-tunneled Central Venous Catheter(36556)with US  guidance (23062)    Indication(s) Medication administration, Difficult access, and Hemodialysis  Consent Risks of the procedure as well as the alternatives and risks of each were explained to the patient and/or caregiver.  Consent for the procedure was obtained and is signed in the bedside chart  Anesthesia Topical only with 1% lidocaine    Timeout Verified patient identification, verified procedure, site/side was marked, verified correct patient position, special equipment/implants available, medications/allergies/relevant history reviewed, required imaging and test results available.  Sterile Technique Maximal sterile technique including full sterile barrier drape, hand hygiene, sterile gown, sterile gloves, mask, hair covering, sterile ultrasound probe cover (if used).  Procedure Description Area of catheter insertion was cleaned with chlorhexidine  and draped in sterile fashion.   With real-time ultrasound guidance a HD catheter was placed into the left femoral vein.  Nonpulsatile blood flow and easy flushing noted in all ports.  The catheter was sutured in place and sterile dressing applied.  Complications/Tolerance None; patient tolerated the procedure well. Chest X-ray is ordered to verify placement for internal jugular or subclavian cannulation.  Chest x-ray is not ordered for femoral cannulation.  EBL Minimal  Specimen(s) None  Lonell Moose, AGNP  Pulmonary/Critical Care Pager [phone] (please enter 7 digits) PCCM Consult Pager [phone] (please enter 7 digits)

## 2024-05-16 NOTE — Plan of Care (Signed)
  Problem: Clinical Measurements: Goal: Diagnostic test results will improve Outcome: Progressing Goal: Cardiovascular complication will be avoided Outcome: Progressing   Problem: Elimination: Goal: Will not experience complications related to urinary retention Outcome: Progressing   

## 2024-05-16 NOTE — Progress Notes (Signed)
 NAME:  Kevin Gross, MRN:  [id number], DOB:  May 15, 1956, LOS: 1 ADMISSION DATE:  05/15/2024, CHIEF COMPLAINT:  Hypotension   History of Present Illness:   68 yo M presenting to [facility] ED from home for evaluation of SOB and progressive weakness.  History obtained per chart review and patient bedside report. Patient reports progressive weakness/ dizziness, decreased urinary output and poor PO intake over the last 2 weeks. This with correlating, multiple episodes of nausea/vomiting and diarrhea until 1-2 days ago (9/15-9/16). He denies noting any blood in his emesis or stool. He also endorses RLQ mild abdominal tenderness as well as increasing abdominal distension. He has also noted dyspnea with exertion over the last month or two. He denies chest pain, new cough, recent falls, LOC, blurred vision, dysuria. He reports his HR is chronically in the 40's-50's, which does correlate with my chart review.   Of note upon arrival in ED he denied any GI symptoms or new distension, also reporting his UOP as normal. Daughter reported to EDP that the patient has continued to drink fluids but has not eaten very much.  He reported continued abstinence from alcohol and tobacco products, he denied recreational drug use. And he is aware he should not use NSAID medications.   ED course: Upon arrival, patient alert and responsive, with chronic bradycardia and acutely soft hemodynamics. Labs significant for AGMA, acute renal failure, Transaminitis, mildly elevated BNP and mildly elevated but flat troponin suggestive of slight demand ischemia with leukocytosis. Imaging revealed small bilateral pleural effusions and small ascites with small ascites. Patient became hypotensive which responded to IVF bolus, then dropped again facilitating the initiation of peripheral vasopressor support.  Pertinent  Medical History  CKD Stage 4 Alcoholic Liver Cirrhosis COPD HLD HTN PAD OSA on CPAP Prostate Cancer Gout Prior ETOH  abuse Iron  Deficiency Anemia Former smoker Bradycardia HFpEF (02/2024: LVEF 60-65%, G2DD)  Significant Hospital Events: Including procedures, antibiotic start and stop dates in addition to other pertinent events   05/15/24: Admit patient to ICU due to circulatory shock s/t unclear etiology in the setting of AKI on CKD stage 4 requiring vasopressor support. 05/16/24: remains on pressors  Interim History / Subjective:  Awake and alert, following commands. Denies any complaints.  Objective    Blood pressure (!) 110/31, pulse (!) 56, temperature 98 F (36.7 C), temperature source Oral, resp. rate 14, height 5' 9 (1.753 m), weight 115.6 kg, SpO2 97%.        Intake/Output Summary (Last 24 hours) at 05/16/2024 1516 Last data filed at 05/16/2024 1500 Gross per 24 hour  Intake 5205.78 ml  Output 325 ml  Net 4880.78 ml   Filed Weights   05/15/24 1518 05/15/24 2053 05/16/24 0456  Weight: 116.6 kg 115.6 kg 115.6 kg    Examination: Physical Exam Constitutional:      Appearance: Normal appearance.  Cardiovascular:     Rate and Rhythm: Normal rate and regular rhythm.     Pulses: Normal pulses.     Heart sounds: Normal heart sounds.  Pulmonary:     Effort: Pulmonary effort is normal.     Breath sounds: No wheezing.  Abdominal:     General: There is distension.     Palpations: Abdomen is soft.  Musculoskeletal:     Right lower leg: Edema present.     Left lower leg: Edema present.  Neurological:     General: No focal deficit present.     Mental Status: He is alert. Mental status is  at baseline.      Assessment and Plan   68 year old male with history of ascites presenting with weakness after recent diarrheal illness. He is found to be in circulatory shock with decompensated liver cirrhosis and acute renal failure. He is admitted to the ICU for vasopressor support and is currently initiated on renal replacement therapy.  #Circulatory Shock  #Septic Shock #Decompensated Liver  Cirrhosis #AKI on CKD  #AGMA #HFpEF #PAD  #HTN  #Afib  Neuro - Does not appear encephalopathic on exam, but does have a delayed response. Will check ammonia level as could be an increased risk for hepatic encephalopathy. Avoiding psychotropic medications as able.  CV - hypotensive and is requiring vasopressor support with nor-epinphrine and vasopressin . Differential for this includes septic shock vs vasoplegia secondary to his decompensated liver cirrhosis. TTE obtained and cardiac function is within normal. Lactic acid is within normal on two checks as well suggesting preserved perfusion. Maintained on nor-epinephrine  and vasopressin  for goal MAP > 65 mmHg while he undergoes his workup. Given liver cirrhosis we will volume resuscitate him with albumin .  Pulm - does have a small right sided pleural effusion that appears simple appearing on ultrasound with underlying consolidated vs atelectatic RLL. Differential diagnosis includes hepatic hydrothorax vs para-pneumonic effusion. On CTX/Azithro for CAP coverage. The pleural fluid appears simple and free flowing on bedside ultrasound and without any septation to suggest a complicated pleural space. Will continue to monitor with daily POCUS and consider sampling if grows in size.  Renal - AKI on CKD with metabolic acidosis in the setting of severe hypotension. Differential diagnosis includes pre-renal injury secondary to septic shock, dehydration from diarrhea, as well as a hepato-renal syndrome. Given history of decompensated liver cirrhosis, we will rule out SBP, and administer albumin  for volume resuscitation (for both SBP and HRS pending workup). He is currently on nor-epinephrine  and vasopressin , and addition of albumin  is sufficient as treatment for HRS. Should septic shock be ruled out we will consider terlipressin, but I fear his renal failure might be beyond salvage given severity. We've consulted with renal for consideration of CRRT given severe  AKI and associated metabolic acidosis.  GI - has underlying decompensated liver cirrhosis, with ascites noted. Will rule out SBP, and administer albumin  for volume resuscitation. Considering HRS on the differential (renal above). He is not showing signs of variceal bleeding. Will obtain a RUQ U/S to rule out portal vein thrombosis.  Hem/Onc- heparin  for DVT prophylaxis. Will consider cortisol if his shock is resistant to therapy. Will administer IV vitamin K  given liver cirrhosis and follow coags.  Endo - ICU glycemic protocol.  ID - will send blood cultures, and continue treatment with CTX/Azithromycin  for both CAP and SBP. Unable to send for legionella/strep urinary antigens as he has not produced urine. Paracentesis performed at bedside and samples sent for analysis.  -continue CTX 2 grams daily and Azithromycin  500 mg daily  -check blood cultures  -peritoneal fluid analysis  Labs   CBC: Recent Labs  Lab 05/15/24 1528 05/16/24 0441  WBC 13.3* 18.2*  HGB 11.4* 10.8*  HCT 36.4* 33.8*  MCV 102.8* 98.5  PLT 186 211    Basic Metabolic Panel: Recent Labs  Lab 05/15/24 1528 05/15/24 2148 05/16/24 0441 05/16/24 1128  NA 142 143 143 142  K 5.0 5.1 4.8 4.7  CL 109 113* 117* 111  CO2 14* 15* 16* 15*  GLUCOSE 102* 103* 104* 109*  BUN 117* 114* 115* 118*  CREATININE 11.33* 11.02*  10.89* 10.51*  CALCIUM  8.2* 7.8* 7.6* 7.7*  MG 2.4  --  2.5*  --   PHOS  --   --  8.5*  --    GFR: Estimated Creatinine Clearance: 8.7 mL/min (A) (by C-G formula based on SCr of 10.51 mg/dL (H)). Recent Labs  Lab 05/15/24 1528 05/15/24 2150 05/16/24 0441 05/16/24 1056  WBC 13.3*  --  18.2*  --   LATICACIDVEN  --  1.5  --  1.4    Liver Function Tests: Recent Labs  Lab 05/15/24 1528 05/16/24 0441  AST 98* 95*  ALT 49* 47*  ALKPHOS 187* 183*  BILITOT 1.2 1.3*  PROT 6.1* 5.8*  ALBUMIN  2.3* 2.2*   No results for input(s): LIPASE, AMYLASE in the last 168 hours. Recent Labs  Lab  05/16/24 1128  AMMONIA 67*    ABG    Component Value Date/Time   PHART 7.298 (L) 11/27/2021 0345   PCO2ART 39.7 11/27/2021 0345   PO2ART 116 (H) 11/27/2021 0345   HCO3 14.1 (L) 05/16/2024 0441   TCO2 21 (L) 08/08/2023 0916   ACIDBASEDEF 11.3 (H) 05/16/2024 0441   O2SAT 69.2 05/16/2024 0441     Coagulation Profile: No results for input(s): INR, PROTIME in the last 168 hours.  Cardiac Enzymes: No results for input(s): CKTOTAL, CKMB, CKMBINDEX, TROPONINI in the last 168 hours.  HbA1C: Hgb A1c MFr Bld  Date/Time Value Ref Range Status  11/27/2021 03:28 AM 5.6 4.8 - 5.6 % Final    Comment:    (NOTE) Pre diabetes:          5.7%-6.4%  Diabetes:              >6.4%  Glycemic control for   <7.0% adults with diabetes     CBG: Recent Labs  Lab 05/15/24 2047  GLUCAP 80    Review of Systems:   N/A  Past Medical History:  He,  has a past medical history of Alcoholic cirrhosis of liver (HCC), Anemia (2024), Cataract (2024), CHF (congestive heart failure) (HCC), Chronic gouty arthritis, CKD (chronic kidney disease), stage III (HCC), Clotting disorder (HCC), Complication of anesthesia, COPD (chronic obstructive pulmonary disease) (HCC), Hyperlipidemia, Hypertension, Hyperuricemia, Peripheral arterial disease (HCC), Pre-diabetes, Prostate cancer (HCC) (2023), and Sleep apnea.   Surgical History:   Past Surgical History:  Procedure Laterality Date   ABDOMINAL AORTOGRAM W/LOWER EXTREMITY N/A 10/20/2021   Procedure: ABDOMINAL AORTOGRAM W/LOWER EXTREMITY;  Surgeon: Darron Deatrice LABOR, MD;  Location: [facility];  Service: Cardiovascular;  Laterality: N/A;   ABDOMINAL AORTOGRAM W/LOWER EXTREMITY N/A 08/08/2023   Procedure: ABDOMINAL AORTOGRAM W/LOWER EXTREMITY;  Surgeon: Serene Gaile ORN, MD;  Location: [facility];  Service: Cardiovascular;  Laterality: N/A;   ALLOGRAFT APPLICATION Bilateral 12/22/2021   Procedure: ALLOGRAFT APPLICATION;  Surgeon:  Serene Gaile ORN, MD;  Location: [facility] OR;  Service: Vascular;  Laterality: Bilateral;   ANGIOPLASTY Left 11/26/2021   Procedure: LEFT INFRARENAL LITHOTRIPSY;  Surgeon: Serene Gaile ORN, MD;  Location: [facility] OR;  Service: Vascular;  Laterality: Left;   APPLICATION OF WOUND VAC Bilateral 12/13/2021   Procedure: APPLICATION OF WOUND VAC;  Surgeon: Lanis Fonda BRAVO, MD;  Location: [facility] OR;  Service: Vascular;  Laterality: Bilateral;   CARDIAC CATHETERIZATION     [facility]   CATARACT EXTRACTION W/PHACO Left 04/04/2023   Procedure: CATARACT EXTRACTION PHACO AND INTRAOCULAR LENS PLACEMENT (IOC) LEFT 8.71 00:45.5;  Surgeon: Jaye Fallow, MD;  Location: [facility];  Service: Ophthalmology;  Laterality: Left;  CATARACT EXTRACTION W/PHACO Right 04/18/2023   Procedure: CATARACT EXTRACTION PHACO AND INTRAOCULAR LENS PLACEMENT (IOC) RIGHT 4.31 00:8.2;  Surgeon: Jaye Fallow, MD;  Location: [facility];  Service: Ophthalmology;  Laterality: Right;   COLONOSCOPY     COLONOSCOPY WITH PROPOFOL  N/A 01/17/2018   Procedure: COLONOSCOPY WITH PROPOFOL ;  Surgeon: Toledo, Ladell POUR, MD;  Location: [facility];  Service: Gastroenterology;  Laterality: N/A;   COLONOSCOPY WITH PROPOFOL  N/A 09/12/2022   Procedure: COLONOSCOPY WITH PROPOFOL ;  Surgeon: Maryruth Ole DASEN, MD;  Location: [facility];  Service: Endoscopy;  Laterality: N/A;   COLONOSCOPY WITH PROPOFOL  N/A 09/05/2023   Procedure: COLONOSCOPY WITH PROPOFOL ;  Surgeon: Maryruth Ole DASEN, MD;  Location: [facility];  Service: Endoscopy;  Laterality: N/A;   ENDARTERECTOMY FEMORAL Bilateral 11/26/2021   Procedure: BILATERAL FEMORAL ENDARTERECTOMY WITH VEIN PATCH ANGIOPLASTY;  Surgeon: Serene Gaile ORN, MD;  Location: [facility];  Service: Vascular;  Laterality: Bilateral;   ENTEROSCOPY N/A 09/05/2023   Procedure: ENTEROSCOPY;  Surgeon: Maryruth Ole DASEN, MD;  Location: [facility];  Service: Endoscopy;  Laterality: N/A;    ESOPHAGOGASTRODUODENOSCOPY (EGD) WITH PROPOFOL  N/A 01/17/2018   Procedure: ESOPHAGOGASTRODUODENOSCOPY (EGD) WITH PROPOFOL ;  Surgeon: Toledo, Ladell POUR, MD;  Location: [facility];  Service: Gastroenterology;  Laterality: N/A;   ESOPHAGOGASTRODUODENOSCOPY (EGD) WITH PROPOFOL  N/A 09/12/2022   Procedure: ESOPHAGOGASTRODUODENOSCOPY (EGD) WITH PROPOFOL ;  Surgeon: Maryruth Ole DASEN, MD;  Location: [facility];  Service: Endoscopy;  Laterality: N/A;   EYE SURGERY     GROIN DEBRIDEMENT Bilateral 12/16/2021   Procedure: IRRIGATION AND DEBRIDEMENT OF BILATERAL GROINS;  Surgeon: Serene Gaile ORN, MD;  Location: [facility];  Service: Vascular;  Laterality: Bilateral;   INCISION AND DRAINAGE OF WOUND Bilateral 12/13/2021   Procedure: IRRIGATION AND DEBRIDEMENT OF BILATERAL GROIN WOUNDS;  Surgeon: Lanis Fonda BRAVO, MD;  Location: [facility] OR;  Service: Vascular;  Laterality: Bilateral;   INSERTION OF ILIAC STENT Bilateral 11/26/2021   Procedure: BILATERAL ILIAC STENTING USING 7mmX80mm INNOVA STENT ON LEFT ILIAC AND 88mmX59mm, 8mmX79mm VBX AND 19mmX7.5cm VIABAHN STENT ON RIGHT ILIAC;  Surgeon: Serene Gaile ORN, MD;  Location: [facility] OR;  Service: Vascular;  Laterality: Bilateral;   INSERTION OF SEEDS IN PROSTATE     ORIF ANKLE FRACTURE Right    PERIPHERAL INTRAVASCULAR LITHOTRIPSY  08/08/2023   Procedure: PERIPHERAL INTRAVASCULAR LITHOTRIPSY;  Surgeon: Serene Gaile ORN, MD;  [facility];  Service: Cardiovascular;;   PROSTATE BIOPSY N/A 04/01/2021   Procedure: PROSTATE BIOPSY GRAYCE;  Surgeon: Kassie Ozell SAUNDERS, MD;  Location: [facility] ORS;  Service: Urology;  Laterality: N/A;   VEIN HARVEST Bilateral 11/26/2021   Procedure: VEIN HARVEST OF BILATERAL SPAHENOUS VEINS;  Surgeon: Serene Gaile ORN, MD;  Location: [facility];  Service: Vascular;  Laterality: Bilateral;   WOUND DEBRIDEMENT Bilateral 12/22/2021   Procedure: INCISION AND DEBRIDEMENT OF BILATERAL GROINS;  Surgeon: Serene Gaile ORN, MD;  Location: [facility];   Service: Vascular;  Laterality: Bilateral;     Social History:   reports that he quit smoking about 9 years ago. His smoking use included cigarettes. He started smoking about 56 years ago. He has a 47 pack-year smoking history. He has never been exposed to tobacco smoke. He has never used smokeless tobacco. He reports that he does not currently use alcohol. He reports that he does not use drugs.   Family History:  His family history includes Heart disease in his brother and mother.   Allergies Allergies  Allergen Reactions   Jardiance [Empagliflozin]  Loss of balance, hypoglycemic      Home Medications  Prior to Admission medications   Medication Sig Start Date End Date Taking? Authorizing Provider  allopurinol  (ZYLOPRIM ) 100 MG tablet Take 150 mg by mouth daily.   Yes [provider]  amiodarone  (PACERONE ) 200 MG tablet Take 200 mg by mouth 2 (two) times daily.   Yes [provider]  amoxicillin-clavulanate (AUGMENTIN) 500-125 MG tablet Take 1 tablet by mouth 2 (two) times daily. 05/06/24  Yes [provider]  aspirin  EC 81 MG tablet Take 81 mg by mouth every morning.   Yes [provider]  chlorthalidone  (HYGROTON ) 25 MG tablet Take 25 mg by mouth daily.   Yes [provider]  Cholecalciferol  (VITAMIN D3) 25 MCG (1000 UT) CAPS Take 2,000 Units by mouth daily.   Yes [provider]  clopidogrel  (PLAVIX ) 75 MG tablet Take 1 tablet (75 mg total) by mouth daily. 01/16/23  Yes Baglia, Corrina, PA-C  ezetimibe  (ZETIA ) 10 MG tablet Take 1 tablet (10 mg total) by mouth daily. 04/10/18  Yes Gollan, Timothy J, MD  iron  polysaccharides (NIFEREX) 150 MG capsule Take 1 capsule (150 mg total) by mouth 2 (two) times daily. 03/11/24 06/09/24 Yes Sreenath, Sudheer B, MD  lisinopril  (ZESTRIL ) 30 MG tablet Take 30 mg by mouth daily.   Yes [provider]  simvastatin  (ZOCOR ) 20 MG tablet Take 20 mg by mouth in the morning.   Yes [provider]  triamcinolone  ointment (KENALOG ) 0.1 % Apply 1 Application topically 2 (two) times daily as needed (psoriasis). 07/18/23  Yes [provider]  zolpidem  (AMBIEN ) 10 MG tablet Take 10 mg by mouth at bedtime.   Yes [provider]  pantoprazole  (PROTONIX ) 40 MG tablet Take 40 mg by mouth daily.    [provider]     The patient is critically ill due to septic shock, liver cirrhosis, acute renal failure.  Critical care was necessary to treat or prevent imminent or life-threatening deterioration. Critical care time was spent by me on the following activities: development of a treatment plan with the patient and/or surrogate as well as nursing, discussions with consultants, evaluation of the patient's response to treatment, examination of the patient, obtaining a history from the patient or surrogate, ordering and performing treatments and interventions, ordering and review of laboratory studies, ordering and review of radiographic studies, review of telemetry data including pulse oximetry, re-evaluation of patient's condition and participation in multidisciplinary rounds.   I personally spent 46 minutes providing critical care not including any separately billable procedures.   Belva November, MD [facility] 05/16/2024 5:19 PM

## 2024-05-16 NOTE — Progress Notes (Signed)
   05/16/24 1800  Vitals  BP (!) 131/55  Pulse Rate 65  Resp 11  Weight 115.9 kg  Type of Weight Post-Dialysis  Oxygen Therapy  SpO2 99 %  O2 Device Room Air  Post Treatment  Dialyzer Clearance Clear  Liters Processed 24  Fluid Removed (mL) 0 mL  Tolerated HD Treatment Yes  Post-Hemodialysis Comments Patient tolerated first treatment. Access functioned at well at normal arterial and venous pressures. Vs have remained stable. No  verbalized concerns post tx

## 2024-05-16 NOTE — Procedures (Signed)
 PROCEDURE SUMMARY:  Successful image-guided paracentesis from the left lower abdomen.  Yielded 4.8 liters of clear yellow fluid.  No immediate complications.  EBL <  1 mL. Patient tolerated well.   Specimen was sent for labs.  Please see imaging section of Epic for full dictation.  Clotilda DELENA Hesselbach PA-C 05/16/2024 3:39 PM

## 2024-05-16 NOTE — TOC Initial Note (Signed)
 Transition of Care [facility]) - Initial/Assessment Note    Patient Details  Name: Kevin Gross MRN: [id number] Date of Birth: 01-Jul-1956  [facility] CM/SW Contact:    Corrie JINNY Ruts, LCSW Phone Number: 05/16/2024, 1:51 PM  Clinical Narrative:                 Chart reviewed. The patient was admitted for shock circulatory. I spoke with the patient at bedside today. I introduced myself, my role and reason for consult.The patient reports that his doing well today. The patient reports that he has a PCP. The patient reports that he lives with his wife, daughter, Wanda, and daughters boyfriend, Camellia. The patient reports that he needed assistance around the home. The patient reports that his daughter or the daughter boyfriend would help him with daily living task. The patient reports that his wife is bed bound. The patient reports that his daughter boyfriend would drive him to medical appointments. The patient reports that the daughter boyfriend will assist him during discharge. The patient reports that he uses [facility] for a pharmacy. The patient reports that he was active with [facility] 2 years ago. The patient reports that he has never been admitted into a SNF. The patient reports that he has a cane, walker, and BSC in the home.   The patient reports no other TOC needs at this time. TOC will follow the patient until discharge.     Barriers to Discharge: Continued Medical Work up   Patient Goals and CMS Choice            Expected Discharge Plan and Services                                              Prior Living Arrangements/Services   Lives with:: Spouse, Adult Children (Also lives with daughters boyfriend.) Patient language and need for interpreter reviewed:: Yes        Need for Family Participation in Patient Care: Yes (Comment)     Criminal Activity/Legal Involvement Pertinent to Current Situation/Hospitalization: No - Comment as  needed  Activities of Daily Living   ADL Screening (condition at time of admission) Independently performs ADLs?: Yes (appropriate for developmental age) Is the patient deaf or have difficulty hearing?: No Does the patient have difficulty seeing, even when wearing glasses/contacts?: No Does the patient have difficulty concentrating, remembering, or making decisions?: No  Permission Sought/Granted                  Emotional Assessment Appearance:: Appears stated age Attitude/Demeanor/Rapport: Engaged, Gracious Affect (typically observed): Calm, Pleasant Orientation: : Oriented to Self, Oriented to Place, Oriented to  Time, Oriented to Situation Alcohol / Substance Use: Not Applicable Psych Involvement: No (comment)  Admission diagnosis:  Shock circulatory (HCC) [R57.9] Generalized weakness [R53.1] Hypotension, unspecified hypotension type [I95.9] Acute renal failure, unspecified acute renal failure type (HCC) [N17.9] Patient Active Problem List   Diagnosis Date Noted   Shock circulatory (HCC) 05/15/2024   ABLA (acute blood loss anemia) 03/08/2024   Acute on chronic diastolic CHF (congestive heart failure) (HCC) 03/08/2024   Dyslipidemia 03/08/2024   Paroxysmal atrial fibrillation (HCC) 03/08/2024   Iron  deficiency anemia 07/17/2023   Symptomatic anemia 09/09/2022   Anemia 12/19/2021   Alcoholic cirrhosis of liver without ascites (HCC) 12/13/2021   Pre-diabetes 12/13/2021   OSA (obstructive  sleep apnea) 12/13/2021   Essential hypertension 12/13/2021   Wound infection after surgery 12/12/2021   PAF (paroxysmal atrial fibrillation) (HCC)    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) 11/26/2021   PAD (peripheral artery disease) (HCC) 11/26/2021   Chronic gouty arthritis 04/16/2019   Encounter for long-term (current) use of high-risk medication 04/16/2019   Stage 3b chronic kidney disease (CKD) (HCC) 03/05/2019   Hyperuricemia 03/05/2019    Cellulitis of left lower extremity 02/18/2019   Aortic atherosclerosis (HCC) 04/10/2018   Class 3 obesity 04/10/2018   CAD (coronary artery disease), native coronary artery 04/08/2018   COPD (chronic obstructive pulmonary disease) (HCC) 04/08/2018   Acute kidney injury superimposed on chronic kidney disease (HCC) 04/08/2018   Hx of adenomatous colonic polyps 08/29/2016   PCP:  Autry Grayce LABOR, PA Pharmacy:   [facility] - [place], [place] - [address] [employer] [place] [address] Phone: [phone] Fax: [phone]     Social Drivers of Health (SDOH) Social History: SDOH Screenings   Food Insecurity: No Food Insecurity (05/15/2024)  Housing: Low Risk  (05/15/2024)  Transportation Needs: No Transportation Needs (05/15/2024)  Utilities: Not At Risk (05/15/2024)  Depression (PHQ2-9): Low Risk  (03/14/2024)  Financial Resource Strain: Low Risk  (07/17/2023)  Social Connections: Moderately Isolated (05/15/2024)  Tobacco Use: Medium Risk (05/08/2024)   SDOH Interventions:     Readmission Risk Interventions    05/16/2024    1:46 PM 09/13/2022   11:09 AM 12/24/2021   12:03 PM  Readmission Risk Prevention Plan  Transportation Screening Complete Complete Complete  PCP or Specialist Appt within 5-7 Days   Complete  PCP or Specialist Appt within 3-5 Days Complete    Home Care Screening  Patient refused Complete  Medication Review (RN CM)  Complete Complete  Palliative Care Screening Not Applicable    Medication Review (RN Care Manager) Complete

## 2024-05-16 NOTE — Consult Note (Signed)
 [facility]  CONSULT NOTE    Date: 05/16/2024                  Patient Name:  Kevin Gross  MRN: [id number]  DOB: September 11, 1955  Age / Sex: 68 y.o., male         PCP: Autry Grayce LABOR, PA                 Service Requesting Consult: TRH                 Reason for Consult: Acute kidney injury            History of Present Illness: Kevin Gross is a 68 y.o.  male with past medical conditions including hypertension, COPD, obstructive sleep apnea with CPAP, anemia, and chronic kidney disease stage IV, who was admitted to [facility] on 05/15/2024 for Shock circulatory (HCC) [R57.9] Generalized weakness [R53.1] Hypotension, unspecified hypotension type [I95.9] Acute renal failure, unspecified acute renal failure type [facility]) [N17.9]  Patient is seen and evaluated at bedside in ICU.  Patient presented to the emergency department with complaints of dizziness and shortness of breath.  States those symptoms were occurring for about a week.  Also states he began having decreased appetite.  Low-dose levo present during time of visit along with sodium bicarb IV infusion.  Labs on ED arrival concerning for serum bicarb 14, BUN 117, creatinine 11.33 with GFR 5, anion gap 19, white count 13.3 with hemoglobin 11.4.  Chest x-ray shows small bilateral pleural effusions.  Renal ultrasound negative for obstruction with increased echogenicity and small bilateral nonobstructive renal cyst.  Decreased urine output.     Medications: Outpatient medications: Medications Prior to Admission  Medication Sig Dispense Refill Last Dose/Taking   allopurinol  (ZYLOPRIM ) 100 MG tablet Take 150 mg by mouth daily.   05/15/2024 Morning   amiodarone  (PACERONE ) 200 MG tablet Take 200 mg by mouth 2 (two) times daily.   05/15/2024 Morning   amoxicillin-clavulanate (AUGMENTIN) 500-125 MG tablet Take 1 tablet by mouth 2 (two) times daily.   05/15/2024 Morning   aspirin  EC 81 MG tablet Take 81 mg by mouth  every morning.   05/15/2024 Morning   chlorthalidone  (HYGROTON ) 25 MG tablet Take 25 mg by mouth daily.   05/15/2024 Morning   Cholecalciferol  (VITAMIN D3) 25 MCG (1000 UT) CAPS Take 2,000 Units by mouth daily.   05/15/2024 Morning   clopidogrel  (PLAVIX ) 75 MG tablet Take 1 tablet (75 mg total) by mouth daily. 30 tablet 11 05/15/2024 Morning   ezetimibe  (ZETIA ) 10 MG tablet Take 1 tablet (10 mg total) by mouth daily. 90 tablet 3 05/15/2024 Morning   iron  polysaccharides (NIFEREX) 150 MG capsule Take 1 capsule (150 mg total) by mouth 2 (two) times daily. 180 capsule 0 05/15/2024 Morning   lisinopril  (ZESTRIL ) 30 MG tablet Take 30 mg by mouth daily.   05/15/2024 Morning   simvastatin  (ZOCOR ) 20 MG tablet Take 20 mg by mouth in the morning.   05/15/2024 Morning   triamcinolone  ointment (KENALOG ) 0.1 % Apply 1 Application topically 2 (two) times daily as needed (psoriasis).   Unknown   zolpidem  (AMBIEN ) 10 MG tablet Take 10 mg by mouth at bedtime.   05/14/2024 Evening   pantoprazole  (PROTONIX ) 40 MG tablet Take 40 mg by mouth daily.   Unknown    Current medications: Current Facility-Administered Medications  Medication Dose Route Frequency Provider Last Rate Last Admin   0.9 %  sodium  chloride infusion  250 mL Intravenous Continuous Willo Dunnings, MD   Stopped at 05/15/24 2137   albumin  human 25 % solution 25 g  25 g Intravenous Q6H Isadora Hose, MD   Stopped at 05/16/24 1217   azithromycin  (ZITHROMAX ) 500 mg in sodium chloride  0.9 % 250 mL IVPB  500 mg Intravenous Q24H Isadora Hose, MD   Stopped at 05/16/24 1331   cefTRIAXone  (ROCEPHIN ) 2 g in sodium chloride  0.9 % 100 mL IVPB  2 g Intravenous Q24H Rust-Chester, Britton L, NP       Chlorhexidine  Gluconate Cloth 2 % PADS 6 each  6 each Topical Daily Isadora Hose, MD       NOREEN ON 05/17/2024] Chlorhexidine  Gluconate Cloth 2 % PADS 6 each  6 each Topical Q0600 Kailand Seda, Faith, NP       docusate sodium  (COLACE) capsule 100 mg  100 mg Oral BID PRN  Rust-Chester, Jenita CROME, NP       heparin  injection 5,000 Units  5,000 Units Subcutaneous Q8H Rust-Chester, Britton L, NP   5,000 Units at 05/16/24 1410   [START ON 05/17/2024] Influenza vac split trivalent PF (FLUZONE HIGH-DOSE) injection 0.5 mL  0.5 mL Intramuscular Tomorrow-1000 Rust-Chester, Britton L, NP       ipratropium-albuterol  (DUONEB) 0.5-2.5 (3) MG/3ML nebulizer solution 3 mL  3 mL Nebulization Q4H PRN Rust-Chester, Jenita L, NP       norepinephrine  (LEVOPHED ) 16 mg in 250mL (0.064 mg/mL) premix infusion  0-40 mcg/min Intravenous Titrated Nelson, Dana G, NP 16.88 mL/hr at 05/16/24 1500 18 mcg/min at 05/16/24 1500   Oral care mouth rinse  15 mL Mouth Rinse PRN Isadora Hose, MD       pantoprazole  (PROTONIX ) EC tablet 40 mg  40 mg Oral Daily Rust-Chester, Britton L, NP   40 mg at 05/16/24 1051   phytonadione  (VITAMIN K ) 10 mg in dextrose  5 % 50 mL IVPB  10 mg Intravenous Daily Dgayli, Hose, MD       polyethylene glycol (MIRALAX  / GLYCOLAX ) packet 17 g  17 g Oral Daily PRN Rust-Chester, Britton L, NP       sodium bicarbonate  150 mEq in sterile water  1,150 mL infusion   Intravenous Continuous Rust-Chester, Britton L, NP 75 mL/hr at 05/16/24 1538 New Bag at 05/16/24 1538   vasopressin  (PITRESSIN) 20 Units in 100 mL (0.2 unit/mL) infusion-*FOR SHOCK*  0-0.04 Units/min Intravenous Continuous Dgayli, Khabib, MD 9 mL/hr at 05/16/24 1500 0.03 Units/min at 05/16/24 1500      Allergies: Allergies  Allergen Reactions   Jardiance [Empagliflozin]     Loss of balance, hypoglycemic       Past Medical History: Past Medical History:  Diagnosis Date   Alcoholic cirrhosis of liver (HCC)    Anemia 2024   Cataract 2024   CHF (congestive heart failure) (HCC)    Chronic gouty arthritis    CKD (chronic kidney disease), stage III (HCC)    Clotting disorder (HCC)    Bilateral legs-pt states plaque build up in both legs requiring intervention   Complication of anesthesia    pt states that years  ago during colonoscopy at [facility] they had to abort the procedure due to some cardiac issue had to see cardiologist   COPD (chronic obstructive pulmonary disease) (HCC)    Hyperlipidemia    Hypertension    Hyperuricemia    Peripheral arterial disease (HCC)    Pre-diabetes    Prostate cancer (HCC) 2023   Sleep apnea    uses cpap  Past Surgical History: Past Surgical History:  Procedure Laterality Date   ABDOMINAL AORTOGRAM W/LOWER EXTREMITY N/A 10/20/2021   Procedure: ABDOMINAL AORTOGRAM W/LOWER EXTREMITY;  Surgeon: Darron Deatrice LABOR, MD;  [facility]: [facility];  Service: Cardiovascular;  Laterality: N/A;   ABDOMINAL AORTOGRAM W/LOWER EXTREMITY N/A 08/08/2023   Procedure: ABDOMINAL AORTOGRAM W/LOWER EXTREMITY;  Surgeon: Serene Gaile ORN, MD;  Location: [facility];  Service: Cardiovascular;  Laterality: N/A;   ALLOGRAFT APPLICATION Bilateral 12/22/2021   Procedure: ALLOGRAFT APPLICATION;  Surgeon: Serene Gaile ORN, MD;  Location: [facility] OR;  Service: Vascular;  Laterality: Bilateral;   ANGIOPLASTY Left 11/26/2021   Procedure: LEFT INFRARENAL LITHOTRIPSY;  Surgeon: Serene Gaile ORN, MD;  Location: [facility] OR;  Service: Vascular;  Laterality: Left;   APPLICATION OF WOUND VAC Bilateral 12/13/2021   Procedure: APPLICATION OF WOUND VAC;  Surgeon: Lanis Fonda BRAVO, MD;  Location: [facility] OR;  Service: Vascular;  Laterality: Bilateral;   CARDIAC CATHETERIZATION     [facility]   CATARACT EXTRACTION W/PHACO Left 04/04/2023   Procedure: CATARACT EXTRACTION PHACO AND INTRAOCULAR LENS PLACEMENT (IOC) LEFT 8.71 00:45.5;  Surgeon: Jaye Fallow, MD;  Location: [facility];  Service: Ophthalmology;  Laterality: Left;   CATARACT EXTRACTION W/PHACO Right 04/18/2023   Procedure: CATARACT EXTRACTION PHACO AND INTRAOCULAR LENS PLACEMENT (IOC) RIGHT 4.31 00:8.2;  Surgeon: Jaye Fallow, MD;  Location: [facility];  Service: Ophthalmology;  Laterality: Right;   COLONOSCOPY      COLONOSCOPY WITH PROPOFOL  N/A 01/17/2018   Procedure: COLONOSCOPY WITH PROPOFOL ;  Surgeon: Toledo, Ladell POUR, MD;  Location: [facility]COPY;  Service: Gastroenterology;  Laterality: N/A;   COLONOSCOPY WITH PROPOFOL  N/A 09/12/2022   Procedure: COLONOSCOPY WITH PROPOFOL ;  Surgeon: Maryruth Ole DASEN, MD;  Location: [facility] END[facility]COPY;  Service: Endoscopy;  Laterality: N/A;   COLONOSCOPY WITH PROPOFOL  N/A 09/05/2023   Procedure: COLONOSCOPY WITH PROPOFOL ;  Surgeon: Maryruth Ole DASEN, MD;  Location: [facility];  Service: Endoscopy;  Laterality: N/A;   ENDARTERECTOMY FEMORAL Bilateral 11/26/2021   Procedure: BILATERAL FEMORAL ENDARTERECTOMY WITH VEIN PATCH ANGIOPLASTY;  Surgeon: Serene Gaile ORN, MD;  Location[facility];  Service: Vascular;  Laterality: Bilateral;   ENTEROSCOPY N/A 09/05/2023   Procedure: ENTEROSCOPY;  Surgeon: Maryruth Ole DASEN, MD;  Location: [facility];  Service: Endoscopy;  Laterality: N/A;   ESOPHAGOGASTRODUODENOSCOPY (EGD) WITH PROPOFOL  N/A 01/17/2018   Procedure: ESOPHAGOGASTRODUODENOSCOPY (EGD) WITH PROPOFOL ;  Surgeon: Toledo, Ladell POUR, MD;  Location: [facility];  Service: Gastroenterology;  Laterality: N/A;   ESOPHAGOGASTRODUODENOSCOPY (EGD) WITH PROPOFOL  N/A 09/12/2022   Procedure: ESOPHAGOGASTRODUODENOSCOPY (EGD) WITH PROPOFOL ;  Surgeon: Maryruth Ole DASEN, MD;  Location: [facility];  Service: Endoscopy;  Laterality: N/A;   EYE SURGERY     GROIN DEBRIDEMENT Bilateral 12/16/2021   Procedure: IRRIGATION AND DEBRIDEMENT OF BILATERAL GROINS;  Surgeon: Serene Gaile ORN, MD;  Location[facility];  Service: Vascular;  Laterality: Bilateral;   INCISION AND DRAINAGE OF WOUND Bilateral 12/13/2021   Procedure: IRRIGATION AND DEBRIDEMENT OF BILATERAL GROIN WOUNDS;  Surgeon: Lanis Fonda BRAVO, MD;  Location: [facility] OR;  Service: Vascular;  Laterality: Bilateral;   INSERTION OF ILIAC STENT Bilateral 11/26/2021   Procedure: BILATERAL ILIAC STENTING USING 71mmX80mm INNOVA STENT ON LEFT  ILIAC AND 7mmX59mm, 68mmX79mm VBX AND 74mmX7.5cm VIABAHN STENT ON RIGHT ILIAC;  Surgeon: Serene Gaile ORN, MD;  Location: [facility];  Service: Vascular;  Laterality: Bilateral;   INSERTION OF SEEDS IN PROSTATE     ORIF ANKLE FRACTURE Right    PERIPHERAL INTRAVASCULAR LITHOTRIPSY  08/08/2023  Procedure: PERIPHERAL INTRAVASCULAR LITHOTRIPSY;  Surgeon: Serene Gaile ORN, MD;  [facility];  Service: Cardiovascular;;   PROSTATE BIOPSY N/A 04/01/2021   Procedure: PROSTATE BIOPSY GRAYCE;  Surgeon: Kassie Ozell SAUNDERS, MD;  Location: [facility] ORS;  Service: Urology;  Laterality: N/A;   VEIN HARVEST Bilateral 11/26/2021   Procedure: VEIN HARVEST OF BILATERAL SPAHENOUS VEINS;  Surgeon: Serene Gaile ORN, MD;  Location[facility];  Service: Vascular;  Laterality: Bilateral;   WOUND DEBRIDEMENT Bilateral 12/22/2021   Procedure: INCISION AND DEBRIDEMENT OF BILATERAL GROINS;  Surgeon: Serene Gaile ORN, MD;  Location: [facility];  Service: Vascular;  Laterality: Bilateral;     Family History: Family History  Problem Relation Age of Onset   Heart disease Mother    Heart disease Brother      Social History: Social History   Socioeconomic History   Marital status: Married    Spouse name: Adelle   Number of children: 2   Years of education: Not on file   Highest education level: Not on file  Occupational History   Occupation: disabled  Tobacco Use   Smoking status: Former    Current packs/day: 0.00    Average packs/day: 1 pack/day for 47.0 years (47.0 ttl pk-yrs)    Types: Cigarettes    Start date: 77    Quit date: 2016    Years since quitting: 9.7    Passive exposure: Never   Smokeless tobacco: Never  Vaping Use   Vaping status: Never Used  Substance and Sexual Activity   Alcohol use: Not Currently   Drug use: No   Sexual activity: Not Currently    Birth control/protection: Other-see comments    Comment: ED  Other Topics Concern   Not on file  Social History Narrative   Not on file    Social Drivers of Health   Financial Resource Strain: Low Risk  (07/17/2023)   Overall Financial Resource Strain (CARDIA)    Difficulty of Paying Living Expenses: Not hard at all  Food Insecurity: No Food Insecurity (05/15/2024)   Hunger Vital Sign    Worried About Running Out of Food in the Last Year: Never true    Ran Out of Food in the Last Year: Never true  Transportation Needs: No Transportation Needs (05/15/2024)   PRAPARE - Administrator, Civil Service (Medical): No    Lack of Transportation (Non-Medical): No  Physical Activity: Not on file  Stress: Not on file  Social Connections: Moderately Isolated (05/15/2024)   Social Connection and Isolation Panel    Frequency of Communication with Friends and Family: More than three times a week    Frequency of Social Gatherings with Friends and Family: More than three times a week    Attends Religious Services: Never    Database administrator or Organizations: No    Attends Banker Meetings: Never    Marital Status: Married  Catering manager Violence: Not At Risk (05/15/2024)   Humiliation, Afraid, Rape, and Kick questionnaire    Fear of Current or Ex-Partner: No    Emotionally Abused: No    Physically Abused: No    Sexually Abused: No     Review of Systems: Review of Systems  Constitutional:  Negative for chills, fever and malaise/fatigue.  HENT:  Negative for congestion, sore throat and tinnitus.   Eyes:  Negative for blurred vision and redness.  Respiratory:  Positive for shortness of breath. Negative for cough and wheezing.   Cardiovascular:  Negative for chest pain, palpitations, claudication and leg swelling.  Gastrointestinal:  Negative for abdominal pain, blood in stool, diarrhea, nausea and vomiting.  Genitourinary:  Negative for flank pain, frequency and hematuria.  Musculoskeletal:  Negative for back pain, falls and myalgias.  Skin:  Negative for rash.  Neurological:  Positive for  dizziness. Negative for weakness and headaches.  Endo/Heme/Allergies:  Does not bruise/bleed easily.  Psychiatric/Behavioral:  Negative for depression. The patient is not nervous/anxious and does not have insomnia.     Vital Signs: Blood pressure (!) 115/48, pulse 62, temperature 98.3 F (36.8 C), resp. rate 12, height 5' 9 (1.753 m), weight 115.8 kg, SpO2 98%.  Weight trends: Filed Weights   05/15/24 2053 05/16/24 0456 05/16/24 1533  Weight: 115.6 kg 115.6 kg 115.8 kg    Physical Exam: General: NAD  Head: Normocephalic, atraumatic. Moist oral mucosal membranes  Eyes: Anicteric  Lungs:  Clear to auscultation, normal effort  Heart: Regular rate and rhythm  Abdomen:  Soft, nontender, obese  Extremities: Trace peripheral edema.  Neurologic: Nonfocal, moving all four extremities  Skin: No lesions  Access: Left femoral HD temp cath     Lab results: Basic Metabolic Panel: Recent Labs  Lab 05/15/24 1528 05/15/24 2148 05/16/24 0441 05/16/24 1128  NA 142 143 143 142  K 5.0 5.1 4.8 4.7  CL 109 113* 117* 111  CO2 14* 15* 16* 15*  GLUCOSE 102* 103* 104* 109*  BUN 117* 114* 115* 118*  CREATININE 11.33* 11.02* 10.89* 10.51*  CALCIUM  8.2* 7.8* 7.6* 7.7*  MG 2.4  --  2.5*  --   PHOS  --   --  8.5*  --     Liver Function Tests: Recent Labs  Lab 05/15/24 1528 05/16/24 0441  AST 98* 95*  ALT 49* 47*  ALKPHOS 187* 183*  BILITOT 1.2 1.3*  PROT 6.1* 5.8*  ALBUMIN  2.3* 2.2*   No results for input(s): LIPASE, AMYLASE in the last 168 hours. Recent Labs  Lab 05/16/24 1128  AMMONIA 67*    CBC: Recent Labs  Lab 05/15/24 1528 05/16/24 0441  WBC 13.3* 18.2*  HGB 11.4* 10.8*  HCT 36.4* 33.8*  MCV 102.8* 98.5  PLT 186 211    Cardiac Enzymes: No results for input(s): CKTOTAL, CKMB, CKMBINDEX, TROPONINI in the last 168 hours.  BNP: Invalid input(s): POCBNP  CBG: Recent Labs  Lab 05/15/24 2047 05/16/24 1605  GLUCAP 80 120*     Microbiology: Results for orders placed or performed during the hospital encounter of 05/15/24  MRSA Next Gen by PCR, Nasal     Status: None   Collection Time: 05/15/24  8:57 PM   Specimen: Nasal Mucosa; Nasal Swab  Result Value Ref Range Status   MRSA by PCR Next Gen NOT DETECTED NOT DETECTED Final    Comment: (NOTE) The GeneXpert MRSA Assay (FDA approved for NASAL specimens only), is one component of a comprehensive MRSA colonization surveillance program. It is not intended to diagnose MRSA infection nor to guide or monitor treatment for MRSA infections. Test performance is not FDA approved in patients less than 71 years old. Performed at [facility], [address] [place], [place] [address]   SARS Coronavirus 2 by RT PCR (hospital order, performed in [facility] hospital lab) *cepheid single result test* Anterior Nasal Swab     Status: None   Collection Time: 05/15/24  8:58 PM   Specimen: Anterior Nasal Swab  Result Value Ref Range Status   SARS Coronavirus 2  by RT PCR NEGATIVE NEGATIVE Final    Comment: (NOTE) SARS-CoV-2 target nucleic acids are NOT DETECTED.  The SARS-CoV-2 RNA is generally detectable in upper and lower respiratory specimens during the acute phase of infection. The lowest concentration of SARS-CoV-2 viral copies this assay can detect is 250 copies / mL. A negative result does not preclude SARS-CoV-2 infection and should not be used as the sole basis for treatment or other patient management decisions.  A negative result may occur with improper specimen collection / handling, submission of specimen other than nasopharyngeal swab, presence of viral mutation(s) within the areas targeted by this assay, and inadequate number of viral copies (<250 copies / mL). A negative result must be combined with clinical observations, patient history, and epidemiological information.  Fact Sheet for Patients:    RoadLapTop.co.za  Fact Sheet for Healthcare Providers: http://kim-miller.com/  This test is not yet approved or  cleared by the [place]  FDA and has been authorized for detection and/or diagnosis of SARS-CoV-2 by FDA under an Emergency Use Authorization (EUA).  This EUA will remain in effect (meaning this test can be used) for the duration of the COVID-19 declaration under Section 564(b)(1) of the Act, 21 U.S.C. section 360bbb-3(b)(1), unless the authorization is terminated or revoked sooner.  Performed at [facility], [address] [place], [place] [address]     Coagulation Studies: No results for input(s): LABPROT, INR in the last 72 hours.  Urinalysis: Recent Labs    05/15/24 2309  COLORURINE YELLOW*  LABSPEC 1.015  PHURINE 5.0  GLUCOSEU NEGATIVE  HGBUR NEGATIVE  BILIRUBINUR NEGATIVE  KETONESUR NEGATIVE  PROTEINUR NEGATIVE  NITRITE NEGATIVE  LEUKOCYTESUR NEGATIVE      Imaging: US  Paracentesis Result Date: 05/16/2024 INDICATION: Patient admitted with history of ETOH cirrhosis, CHF who is currently admitted to the ICU for shock/sepsis. Noted to have ascites on recent imaging. Request for diagnostic and therapeutic paracentesis. EXAM: ULTRASOUND GUIDED DIAGNOSTIC AND THERAPEUTIC PARACENTESIS MEDICATIONS: 5 mL 1% lidocaine  COMPLICATIONS: None immediate. PROCEDURE: Informed written consent was obtained from the patient after a discussion of the risks, benefits and alternatives to treatment. A timeout was performed prior to the initiation of the procedure. Initial ultrasound scanning demonstrates a large amount of ascites within the left lower abdominal quadrant. The left lower abdomen was prepped and draped in the usual sterile fashion. 1% lidocaine  was used for local anesthesia. Following this, a 5 Fr, 7-cm Merit OneStep Centesis catheter was introduced. An ultrasound image was saved for documentation purposes.  The paracentesis was performed. The catheter was removed and a dressing was applied. The patient tolerated the procedure well without immediate post procedural complication. FINDINGS: A total of approximately 4.8 L of clear yellow fluid was removed. Samples were sent to the laboratory as requested by the clinical team. IMPRESSION: Successful ultrasound-guided paracentesis yielding 4.8 liters of peritoneal fluid. Performed by Kevin Hesselbach, PA-C Electronically Signed   By: Juliene Balder M.D.   On: 05/16/2024 16:36   ECHOCARDIOGRAM COMPLETE Result Date: 05/16/2024    ECHOCARDIOGRAM REPORT   Patient Name:   EGE MUCKEY Mcneal Date of Exam: 05/16/2024 Medical Rec #:  [id number]       Height:       69.0 in Accession #:    [id number]      Weight:       254.9 lb Date of Birth:  1956/05/03       BSA:          2.290 m Patient Age:  68 years        BP:           103/39 mmHg Patient Gender: M               HR:           57 bpm. Exam Location:  [facility] Procedure: 2D Echo, Cardiac Doppler and Color Doppler (Both Spectral and Color            Flow Doppler were utilized during procedure). Indications:     Congestive heart failure I50.9  History:         Patient has prior history of Echocardiogram examinations, most                  recent 03/09/2024. CHF; Risk Factors:Hypertension and Sleep                  Apnea.  Sonographer:     Christopher Furnace Referring Phys:  [id number] KHABIB DGAYLI Diagnosing Phys: Kevin Lunger MD IMPRESSIONS  1. Left ventricular ejection fraction, by estimation, is 60 to 65%. The left ventricle has normal function. The left ventricle has no regional wall motion abnormalities. The left ventricular internal cavity size was mildly dilated. There is mild left ventricular hypertrophy. Left ventricular diastolic parameters were normal.  2. Right ventricular systolic function is normal. The right ventricular size is normal. There is normal pulmonary artery systolic pressure. The estimated right ventricular systolic  pressure is 26.9 mmHg.  3. Left atrial size was mildly dilated.  4. The mitral valve is normal in structure. No evidence of mitral valve regurgitation. No evidence of mitral stenosis.  5. The aortic valve is normal in structure. Aortic valve regurgitation is not visualized. No aortic stenosis is present.  6. The inferior vena cava is normal in size with greater than 50% respiratory variability, suggesting right atrial pressure of 3 mmHg. FINDINGS  Left Ventricle: Left ventricular ejection fraction, by estimation, is 60 to 65%. The left ventricle has normal function. The left ventricle has no regional wall motion abnormalities. Strain was performed and the global longitudinal strain is indeterminate. The left ventricular internal cavity size was mildly dilated. There is mild left ventricular hypertrophy. Left ventricular diastolic parameters were normal. Right Ventricle: The right ventricular size is normal. No increase in right ventricular wall thickness. Right ventricular systolic function is normal. There is normal pulmonary artery systolic pressure. The tricuspid regurgitant velocity is 2.34 m/s, and  with an assumed right atrial pressure of 5 mmHg, the estimated right ventricular systolic pressure is 26.9 mmHg. Left Atrium: Left atrial size was mildly dilated. Right Atrium: Right atrial size was normal in size. Pericardium: There is no evidence of pericardial effusion. Mitral Valve: The mitral valve is normal in structure. No evidence of mitral valve regurgitation. No evidence of mitral valve stenosis. MV peak gradient, 4.8 mmHg. The mean mitral valve gradient is 2.0 mmHg. Tricuspid Valve: The tricuspid valve is normal in structure. Tricuspid valve regurgitation is mild . No evidence of tricuspid stenosis. Aortic Valve: The aortic valve is normal in structure. Aortic valve regurgitation is not visualized. No aortic stenosis is present. Aortic valve mean gradient measures 7.0 mmHg. Aortic valve peak gradient  measures 12.0 mmHg. Aortic valve area, by VTI measures 2.21 cm. Pulmonic Valve: The pulmonic valve was normal in structure. Pulmonic valve regurgitation is not visualized. No evidence of pulmonic stenosis. Aorta: The aortic root is normal in size and structure. Venous: The inferior vena cava is normal in size  with greater than 50% respiratory variability, suggesting right atrial pressure of 3 mmHg. IAS/Shunts: No atrial level shunt detected by color flow Doppler. Additional Comments: 3D was performed not requiring image post processing on an independent workstation and was indeterminate.  LEFT VENTRICLE PLAX 2D LVIDd:         5.40 cm   Diastology LVIDs:         3.10 cm   LV e' medial:    11.30 cm/s LV PW:         1.10 cm   LV E/e' medial:  9.2 LV IVS:        1.40 cm   LV e' lateral:   18.40 cm/s LVOT diam:     2.00 cm   LV E/e' lateral: 5.7 LV SV:         87 LV SV Index:   38 LVOT Area:     3.14 cm  RIGHT VENTRICLE RV Basal diam:  3.80 cm RV Mid diam:    4.20 cm RV S prime:     14.30 cm/s TAPSE (M-mode): 2.4 cm LEFT ATRIUM           Index        RIGHT ATRIUM           Index LA diam:      3.60 cm 1.57 cm/m   RA Area:     19.30 cm LA Vol (A2C): 48.4 ml 21.13 ml/m  RA Volume:   49.60 ml  21.66 ml/m LA Vol (A4C): 75.8 ml 33.10 ml/m  AORTIC VALVE AV Area (Vmax):    2.25 cm AV Area (Vmean):   2.10 cm AV Area (VTI):     2.21 cm AV Vmax:           173.00 cm/s AV Vmean:          119.500 cm/s AV VTI:            0.394 m AV Peak Grad:      12.0 mmHg AV Mean Grad:      7.0 mmHg LVOT Vmax:         124.00 cm/s LVOT Vmean:        79.900 cm/s LVOT VTI:          0.278 m LVOT/AV VTI ratio: 0.70  AORTA Ao Root diam: 3.50 cm MITRAL VALVE                TRICUSPID VALVE MV Area (PHT): 3.70 cm     TR Peak grad:   21.9 mmHg MV Area VTI:   2.07 cm     TR Vmax:        234.00 cm/s MV Peak grad:  4.8 mmHg MV Mean grad:  2.0 mmHg     SHUNTS MV Vmax:       1.10 m/s     Systemic VTI:  0.28 m MV Vmean:      62.8 cm/s    Systemic Diam:  2.00 cm MV Decel Time: 205 msec MV E velocity: 104.00 cm/s MV A velocity: 76.50 cm/s MV E/A ratio:  1.36 Kevin Lunger MD Electronically signed by Kevin Lunger MD Signature Date/Time: 05/16/2024/2:54:52 PM    Final    CT ABDOMEN PELVIS WO CONTRAST Result Date: 05/16/2024 CLINICAL DATA:  Right lower quadrant pain EXAM: CT ABDOMEN AND PELVIS WITHOUT CONTRAST TECHNIQUE: Multidetector CT imaging of the abdomen and pelvis was performed following the standard protocol without IV contrast. RADIATION DOSE REDUCTION: This exam was performed according  to the departmental dose-optimization program which includes automated exposure control, adjustment of the mA and/or kV according to patient size and/or use of iterative reconstruction technique. COMPARISON:  None Available. FINDINGS: Lower chest: Small left-sided pleural effusion is noted with left basilar atelectasis. Hepatobiliary: Liver is shrunken and nodular consistent with underlying cirrhotic change. Gallbladder is well distended with dependent gallstones. No biliary ductal dilatation is seen. Pancreas: Unremarkable. No pancreatic ductal dilatation or surrounding inflammatory changes. Spleen: Normal in size without focal abnormality. Adrenals/Urinary Tract: Adrenal glands are within normal limits. Kidneys are well visualized bilaterally. No renal calculi or obstructive changes are noted. Bladder is partially distended. Stomach/Bowel: No obstructive or inflammatory changes of the colon are noted. Scattered diverticular changes seen without evidence of diverticulitis. The appendix is not well visualized. No inflammatory changes to suggest appendicitis are noted. Small bowel and stomach are within normal limits. Vascular/Lymphatic: Aortic atherosclerosis. No enlarged abdominal or pelvic lymph nodes. Reproductive: Prostate is unremarkable.  Therapy seeds are noted. Other: Mild to moderate ascites is noted. Musculoskeletal: Degenerative changes of the lumbar spine are  noted. IMPRESSION: Changes consistent with cirrhotic change of the liver with associated ascites. Left-sided pleural effusion with lower lobe atelectasis. Diverticular change. Electronically Signed   By: Kevin Gross M.D.   On: 05/16/2024 00:01   US  Renal Result Date: 05/15/2024 CLINICAL DATA:  Acute renal insufficiency. EXAM: RENAL / URINARY TRACT ULTRASOUND COMPLETE COMPARISON:  None Available. FINDINGS: Evaluation is limited due to body habitus and ascites. Right Kidney: Renal measurements: 10.7 x 6.5 x 5.6 cm = volume: 202 mL. Mild parenchyma atrophy and increased echogenicity. No hydronephrosis or shadowing stone. Two small hyperechoic lesions measure up to 1.3 cm suboptimally evaluated due to small size, possibly cysts. Left Kidney: Renal measurements: 10.4 x 6.6 x 5.7 cm = volume: 204 mL. Mild parenchyma atrophy and increased echogenicity. No hydronephrosis or shadowing stone. A 2.7 cm hypoechoic lesion in the upper pole is suboptimally evaluated, likely a cyst. Bladder: The urinary bladder is not visualized. Other: There is a small ascites. IMPRESSION: 1. Mild parenchyma atrophy and increased echogenicity. No hydronephrosis or shadowing stone. 2. Probable small bilateral renal cysts. 3. Small ascites. Electronically Signed   By: Vanetta Chou M.D.   On: 05/15/2024 18:28   DG Chest 2 View Result Date: 05/15/2024 CLINICAL DATA:  Shortness of breath and dizziness for 1 week. EXAM: DG CHEST 2V COMPARISON:  May 03, 2024. FINDINGS: The heart size and mediastinal contours are within normal limits. Small bilateral pleural effusions are noted. No consolidative process is noted. The visualized skeletal structures are unremarkable. IMPRESSION: Small bilateral pleural effusions. Electronically Signed   By: Lynwood Landy Raddle M.D.   On: 05/15/2024 16:15     Assessment & Plan: Mr. LATRAVION GRAVES is a 68 y.o.  male with past medical conditions including hypertension, COPD, obstructive sleep apnea with  CPAP, anemia, and chronic kidney disease stage IV, who was admitted to [facility] 05/15/2024 for Shock circulatory (HCC) [R57.9] Generalized weakness [R53.1] Hypotension, unspecified hypotension type [I95.9] Acute renal failure, unspecified acute renal failure type (HCC) [N17.9]  Acute kidney injury on chronic kidney disease stage IV.  Baseline creatinine 2.77 with GFR 24 06/30/2024.  Acute kidney injury likely secondary to hypotension and infectious process.  Renal ultrasound negative for obstruction.  No recent IV contrast exposure.  Due to decreased urine output, we feel it is appropriate to proceed with hemodialysis, patient agreeable to proceed if needed.  Will order short treatment today and assess  need for additional treatment tomorrow.  2.  Acute metabolic acidosis, serum bicarb 14 on admission.  Currently being treated with sodium bicarb infusion.  Hemodialysis will also correct these levels.  3. .Anemia of chronic kidney disease  Lab Results  Component Value Date   HGB 10.8 (L) 05/16/2024    Hemoglobin acceptable at current level.  4.  Hypotension likely sepsis related, source unknown at this time.  Workup in progress.  Primary team to continue management with pressors, norepinephrine  currently infusing.  Prescribed broad-spectrum antibiotics at this time.  5. Secondary Hyperparathyroidism: with outpatient labs: PTH 52.7, phosphorus 4.1, calcium  8.6 on 7/2.   Lab Results  Component Value Date   CALCIUM  7.7 (L) 05/16/2024   CAION 1.21 08/08/2023   PHOS 8.5 (H) 05/16/2024    Will continue to monitor bone minerals during this admission.  LOS: 1 Deavion Strider 9/18/20254:43 PM

## 2024-05-17 ENCOUNTER — Inpatient Hospital Stay

## 2024-05-17 DIAGNOSIS — J189 Pneumonia, unspecified organism: Secondary | ICD-10-CM

## 2024-05-17 LAB — HEPATITIS B SURFACE ANTIBODY, QUANTITATIVE: Hep B S AB Quant (Post): <3.5 m[IU]/mL — ABNORMAL LOW

## 2024-05-17 LAB — CBC
HCT: 27.2 % — ABNORMAL LOW (ref 39.0–52.0)
Hemoglobin: 9.1 g/dL — ABNORMAL LOW (ref 13.0–17.0)
MCH: 32.4 pg (ref 26.0–34.0)
MCHC: 33.5 g/dL (ref 30.0–36.0)
MCV: 96.8 fL (ref 80.0–100.0)
Platelets: 123 K/uL — ABNORMAL LOW (ref 150–400)
RBC: 2.81 MIL/uL — ABNORMAL LOW (ref 4.22–5.81)
RDW: 23.2 % — ABNORMAL HIGH (ref 11.5–15.5)
WBC: 10.9 K/uL — ABNORMAL HIGH (ref 4.0–10.5)
nRBC: 0 % (ref 0.0–0.2)

## 2024-05-17 LAB — GASTROINTESTINAL PANEL BY PCR, STOOL (REPLACES STOOL CULTURE)

## 2024-05-17 LAB — COMPREHENSIVE METABOLIC PANEL WITH GFR
ALT: 41 U/L (ref 0–44)
AST: 95 U/L — ABNORMAL HIGH (ref 15–41)
Albumin: 3.2 g/dL — ABNORMAL LOW (ref 3.5–5.0)
Alkaline Phosphatase: 128 U/L — ABNORMAL HIGH (ref 38–126)
Anion gap: 16 — ABNORMAL HIGH (ref 5–15)
BUN: 91 mg/dL — ABNORMAL HIGH (ref 8–23)
CO2: 20 mmol/L — ABNORMAL LOW (ref 22–32)
Calcium: 7.8 mg/dL — ABNORMAL LOW (ref 8.9–10.3)
Chloride: 106 mmol/L (ref 98–111)
Creatinine, Ser: 7.87 mg/dL — ABNORMAL HIGH (ref 0.61–1.24)
GFR, Estimated: 7 mL/min — ABNORMAL LOW (ref >60)
Glucose, Bld: 121 mg/dL — ABNORMAL HIGH (ref 70–99)
Potassium: 4.1 mmol/L (ref 3.5–5.1)
Sodium: 142 mmol/L (ref 135–145)
Total Bilirubin: 1.7 mg/dL — ABNORMAL HIGH (ref 0.0–1.2)
Total Protein: 5.8 g/dL — ABNORMAL LOW (ref 6.5–8.1)

## 2024-05-17 LAB — C DIFFICILE QUICK SCREEN W PCR REFLEX
C Diff antigen: POSITIVE — AB
C Diff toxin: NEGATIVE

## 2024-05-17 LAB — MAGNESIUM: Magnesium: 2 mg/dL (ref 1.7–2.4)

## 2024-05-17 LAB — CLOSTRIDIUM DIFFICILE BY PCR, REFLEXED
Hypervirulent Strain: NEGATIVE
Toxigenic C. Difficile by PCR: NEGATIVE

## 2024-05-17 LAB — PHOSPHORUS: Phosphorus: 6.3 mg/dL — ABNORMAL HIGH (ref 2.5–4.6)

## 2024-05-17 LAB — PATHOLOGIST SMEAR REVIEW: Path Review: NEGATIVE

## 2024-05-17 MED ORDER — RENA-VITE PO TABS
1.0000 | ORAL_TABLET | Freq: Every day | ORAL | Status: DC
  Administered 2024-05-17 – 2024-05-23 (×7): 1 via ORAL
  Filled 2024-05-17 (×7): qty 1

## 2024-05-17 MED ORDER — THIAMINE HCL 100 MG PO TABS
100.0000 mg | ORAL_TABLET | Freq: Every day | ORAL | Status: AC
  Administered 2024-05-18 – 2024-05-24 (×7): 100 mg via ORAL
  Filled 2024-05-17 (×14): qty 1

## 2024-05-17 MED ORDER — VANCOMYCIN HCL 125 MG PO CAPS
125.0000 mg | ORAL_CAPSULE | Freq: Four times a day (QID) | ORAL | Status: AC
Start: 2024-05-17 — End: 2024-05-21
  Administered 2024-05-17 – 2024-05-21 (×18): 125 mg via ORAL
  Filled 2024-05-17 (×18): qty 1

## 2024-05-17 MED ORDER — MIDODRINE HCL 5 MG PO TABS
10.0000 mg | ORAL_TABLET | Freq: Three times a day (TID) | ORAL | Status: DC
  Administered 2024-05-17 – 2024-05-24 (×22): 10 mg via ORAL
  Filled 2024-05-17 (×20): qty 2

## 2024-05-17 MED ORDER — ALTEPLASE 2 MG IJ SOLR: 2.0000 mg | Freq: Once | INTRAMUSCULAR | Status: DC | PRN

## 2024-05-17 MED ORDER — ENSURE PLUS HIGH PROTEIN PO LIQD
237.0000 mL | Freq: Two times a day (BID) | ORAL | Status: DC
  Administered 2024-05-17: 237 mL via ORAL

## 2024-05-17 MED ORDER — HEPARIN SODIUM (PORCINE) 1000 UNIT/ML DIALYSIS
1000.0000 [IU] | INTRAMUSCULAR | Status: DC | PRN
  Administered 2024-05-17 (×4): 1000 [IU]

## 2024-05-17 MED ORDER — ENSURE PLUS HIGH PROTEIN PO LIQD
237.0000 mL | Freq: Three times a day (TID) | ORAL | Status: DC
  Administered 2024-05-17 – 2024-05-22 (×9): 237 mL via ORAL

## 2024-05-17 MED ORDER — HEPARIN SODIUM (PORCINE) 1000 UNIT/ML IJ SOLN
INTRAMUSCULAR | Status: AC
  Filled 2024-05-17: qty 5

## 2024-05-17 MED ORDER — ADULT MULTIVITAMIN W/MINERALS CH
1.0000 | ORAL_TABLET | Freq: Every day | ORAL | Status: DC
  Administered 2024-05-18 – 2024-05-22 (×5): 1 via ORAL
  Filled 2024-05-17 (×5): qty 1

## 2024-05-17 NOTE — Plan of Care (Signed)

## 2024-05-17 NOTE — Progress Notes (Signed)
 [facility]  ROUNDING NOTE   Subjective:   Patient seen laying in bed Alert and oriented Remains NPO Room air Right lower extremity edema   Objective:  Vital signs in last 24 hours:  Temp:  [97.9 F (36.6 C)-99.1 F (37.3 C)] 99.1 F (37.3 C) (09/19 0945) Pulse Rate:  [30-70] 57 (09/19 1245) Resp:  [9-23] 11 (09/19 1245) BP: (74-131)/(31-98) 110/54 (09/19 1245) SpO2:  [93 %-100 %] 97 % (09/19 1245) FiO2 (%):  [21 %] 21 % (09/18 2322) Weight:  [109.2 kg-115.9 kg] 109.2 kg (09/19 0945)  Weight change: -0.775 kg Filed Weights   05/16/24 2118 05/17/24 0449 05/17/24 0945  Weight: 114.2 kg 114.2 kg 109.2 kg    Intake/Output: I/O last 3 completed shifts: In: 6823.1 [I.V.:2882.5; IV Piggyback:3940.6] Out: 925 [Urine:925]   Intake/Output this shift:  Total I/O In: 125.6 [I.V.:66.7; IV Piggyback:58.9] Out: 200 [Urine:200]  Physical Exam: General: NAD  Head: Normocephalic, atraumatic. Moist oral mucosal membranes  Eyes: Anicteric  Neck: Supple  Lungs:  Clear to auscultation  Heart: Regular rate and rhythm  Abdomen:  Soft, nontender  Extremities:  peripheral edema.  Neurologic: Awake, alert, conversant  Skin: Warm,dry, no rash  Access: Lt femoral Trialysis cath    Basic Metabolic Panel: Recent Labs  Lab 05/15/24 1528 05/15/24 2148 05/16/24 0441 05/16/24 1128 05/16/24 2056 05/17/24 0544  NA 142 143 143 142 140 142  K 5.0 5.1 4.8 4.7 4.3 4.1  CL 109 113* 117* 111 107 106  CO2 14* 15* 16* 15* 19* 20*  GLUCOSE 102* 103* 104* 109* 120* 121*  BUN 117* 114* 115* 118* 86* 91*  CREATININE 11.33* 11.02* 10.89* 10.51* 8.16* 7.87*  CALCIUM  8.2* 7.8* 7.6* 7.7* 7.6* 7.8*  MG 2.4  --  2.5*  --   --  2.0  PHOS  --   --  8.5*  --   --  6.3*    Liver Function Tests: Recent Labs  Lab 05/15/24 1528 05/16/24 0441 05/17/24 0544  AST 98* 95* 95*  ALT 49* 47* 41  ALKPHOS 187* 183* 128*  BILITOT 1.2 1.3* 1.7*  PROT 6.1* 5.8* 5.8*  ALBUMIN  2.3* 2.2* 3.2*    No results for input(s): LIPASE, AMYLASE in the last 168 hours. Recent Labs  Lab 05/16/24 1128  AMMONIA 67*    CBC: Recent Labs  Lab 05/15/24 1528 05/16/24 0441 05/17/24 0544  WBC 13.3* 18.2* 10.9*  HGB 11.4* 10.8* 9.1*  HCT 36.4* 33.8* 27.2*  MCV 102.8* 98.5 96.8  PLT 186 211 123*    Cardiac Enzymes: No results for input(s): CKTOTAL, CKMB, CKMBINDEX, TROPONINI in the last 168 hours.  BNP: Invalid input(s): POCBNP  CBG: Recent Labs  Lab 05/15/24 2047 05/16/24 1605  GLUCAP 80 120*    Microbiology: Results for orders placed or performed during the hospital encounter of 05/15/24  MRSA Next Gen by PCR, Nasal     Status: None   Collection Time: 05/15/24  8:57 PM   Specimen: Nasal Mucosa; Nasal Swab  Result Value Ref Range Status   MRSA by PCR Next Gen NOT DETECTED NOT DETECTED Final    Comment: (NOTE) The GeneXpert MRSA Assay (FDA approved for NASAL specimens only), is one component of a comprehensive MRSA colonization surveillance program. It is not intended to diagnose MRSA infection nor to guide or monitor treatment for MRSA infections. Test performance is not FDA approved in patients less than 58 years old. Performed at [facility], [address], [place], [place]  [address]   SARS Coronavirus 2 by RT PCR (hospital order, performed in [facility] hospital lab) *cepheid single result test* Anterior Nasal Swab     Status: None   Collection Time: 05/15/24  8:58 PM   Specimen: Anterior Nasal Swab  Result Value Ref Range Status   SARS Coronavirus 2 by RT PCR NEGATIVE NEGATIVE Final    Comment: (NOTE) SARS-CoV-2 target nucleic acids are NOT DETECTED.  The SARS-CoV-2 RNA is generally detectable in upper and lower respiratory specimens during the acute phase of infection. The lowest concentration of SARS-CoV-2 viral copies this assay can detect is 250 copies / mL. A negative result does not preclude SARS-CoV-2 infection and should not  be used as the sole basis for treatment or other patient management decisions.  A negative result may occur with improper specimen collection / handling, submission of specimen other than nasopharyngeal swab, presence of viral mutation(s) within the areas targeted by this assay, and inadequate number of viral copies (<250 copies / mL). A negative result must be combined with clinical observations, patient history, and epidemiological information.  Fact Sheet for Patients:   RoadLapTop.co.za  Fact Sheet for Healthcare Providers: http://kim-miller.com/  This test is not yet approved or  cleared by the [place]  FDA and has been authorized for detection and/or diagnosis of SARS-CoV-2 by FDA under an Emergency Use Authorization (EUA).  This EUA will remain in effect (meaning this test can be used) for the duration of the COVID-19 declaration under Section 564(b)(1) of the Act, 21 U.S.C. section 360bbb-3(b)(1), unless the authorization is terminated or revoked sooner.  Performed at [facility], [address] [place], [place] [address]   Body fluid culture w Gram Stain     Status: None (Preliminary result)   Collection Time: 05/16/24  2:50 PM   Specimen: PATH Cytology Peritoneal fluid  Result Value Ref Range Status   Specimen Description   Final    PERITONEAL Performed at [facility], [address], [place], [place] [address]    Special Requests   Final    NONE Performed at [facility], [address] [place], [place] [address]    Gram Stain   Final    WBC PRESENT, PREDOMINANTLY MONONUCLEAR NO ORGANISMS SEEN CYTOSPIN SMEAR    Culture   Final    NO GROWTH < 12 HOURS Performed at [facility], [address], [place], [place] [address]    Report Status PENDING  Incomplete  Culture, blood (Routine X 2) w Reflex to ID Panel     Status: None (Preliminary result)   Collection Time: 05/16/24   6:15 PM   Specimen: BLOOD  Result Value Ref Range Status   Specimen Description BLOOD BLOOD RIGHT ARM  Final   Special Requests   Final    BOTTLES DRAWN AEROBIC AND ANAEROBIC Blood Culture adequate volume   Culture   Final    NO GROWTH < 12 HOURS Performed at [facility], [address] [place], [place] [address]    Report Status PENDING  Incomplete  Culture, blood (Routine X 2) w Reflex to ID Panel     Status: None (Preliminary result)   Collection Time: 05/16/24  6:28 PM   Specimen: BLOOD  Result Value Ref Range Status   Specimen Description BLOOD BLOOD RIGHT ARM  Final   Special Requests   Final    BOTTLES DRAWN AEROBIC AND ANAEROBIC Blood Culture adequate volume   Culture   Final  NO GROWTH < 12 HOURS Performed at [facility], [address] [place], [place] [address]    Report Status PENDING  Incomplete  Gastrointestinal Panel by PCR , Stool     Status: None   Collection Time: 05/17/24  3:20 AM   Specimen: Stool  Result Value Ref Range Status   Campylobacter species NOT DETECTED NOT DETECTED Final   Plesimonas shigelloides NOT DETECTED NOT DETECTED Final   Salmonella species NOT DETECTED NOT DETECTED Final   Yersinia enterocolitica NOT DETECTED NOT DETECTED Final   Vibrio species NOT DETECTED NOT DETECTED Final   Vibrio cholerae NOT DETECTED NOT DETECTED Final   Enteroaggregative E coli (EAEC) NOT DETECTED NOT DETECTED Final   Enteropathogenic E coli (EPEC) NOT DETECTED NOT DETECTED Final   Enterotoxigenic E coli (ETEC) NOT DETECTED NOT DETECTED Final   Shiga like toxin producing E coli (STEC) NOT DETECTED NOT DETECTED Final   Shigella/Enteroinvasive E coli (EIEC) NOT DETECTED NOT DETECTED Final   Cryptosporidium NOT DETECTED NOT DETECTED Final   Cyclospora cayetanensis NOT DETECTED NOT DETECTED Final   Entamoeba histolytica NOT DETECTED NOT DETECTED Final   Giardia lamblia NOT DETECTED NOT DETECTED Final   Adenovirus F40/41 NOT DETECTED NOT  DETECTED Final   Astrovirus NOT DETECTED NOT DETECTED Final   Norovirus GI/GII NOT DETECTED NOT DETECTED Final   Rotavirus A NOT DETECTED NOT DETECTED Final   Sapovirus (I, II, IV, and V) NOT DETECTED NOT DETECTED Final    Comment: Performed at [facility], [address] [place], [place] [address]    Coagulation Studies: Recent Labs    05/16/24 1658  LABPROT 17.8*  INR 1.4*    Urinalysis: Recent Labs    05/15/24 2309  COLORURINE YELLOW*  LABSPEC 1.015  PHURINE 5.0  GLUCOSEU NEGATIVE  HGBUR NEGATIVE  BILIRUBINUR NEGATIVE  KETONESUR NEGATIVE  PROTEINUR NEGATIVE  NITRITE NEGATIVE  LEUKOCYTESUR NEGATIVE      Imaging: US  Paracentesis Result Date: 05/16/2024 INDICATION: Patient admitted with history of ETOH cirrhosis, CHF who is currently admitted to the ICU for shock/sepsis. Noted to have ascites on recent imaging. Request for diagnostic and therapeutic paracentesis. EXAM: ULTRASOUND GUIDED DIAGNOSTIC AND THERAPEUTIC PARACENTESIS MEDICATIONS: 5 mL 1% lidocaine  COMPLICATIONS: None immediate. PROCEDURE: Informed written consent was obtained from the patient after a discussion of the risks, benefits and alternatives to treatment. A timeout was performed prior to the initiation of the procedure. Initial ultrasound scanning demonstrates a large amount of ascites within the left lower abdominal quadrant. The left lower abdomen was prepped and draped in the usual sterile fashion. 1% lidocaine  was used for local anesthesia. Following this, a 5 Fr, 7-cm Merit OneStep Centesis catheter was introduced. An ultrasound image was saved for documentation purposes. The paracentesis was performed. The catheter was removed and a dressing was applied. The patient tolerated the procedure well without immediate post procedural complication. FINDINGS: A total of approximately 4.8 L of clear yellow fluid was removed. Samples were sent to the laboratory as requested by the clinical team.  IMPRESSION: Successful ultrasound-guided paracentesis yielding 4.8 liters of peritoneal fluid. Performed by Clotilda Hesselbach, PA-C Electronically Signed   By: Juliene Balder M.D.   On: 05/16/2024 16:36   ECHOCARDIOGRAM COMPLETE Result Date: 05/16/2024    ECHOCARDIOGRAM REPORT   Patient Name:   ELISHAH ASHMORE Bellotti Date of Exam: 05/16/2024 Medical Rec #:  [id number]       Height:       69.0 in Accession #:    [id number]  Weight:       254.9 lb Date of Birth:  1956-03-04       BSA:          2.290 m Patient Age:    68 years        BP:           103/39 mmHg Patient Gender: M               HR:           57 bpm. Exam Location:  [facility] Procedure: 2D Echo, Cardiac Doppler and Color Doppler (Both Spectral and Color            Flow Doppler were utilized during procedure). Indications:     Congestive heart failure I50.9  History:         Patient has prior history of Echocardiogram examinations, most                  recent 03/09/2024. CHF; Risk Factors:Hypertension and Sleep                  Apnea.  Sonographer:     Christopher Furnace Referring Phys:  [id number] KHABIB DGAYLI Diagnosing Phys: Evalene Lunger MD IMPRESSIONS  1. Left ventricular ejection fraction, by estimation, is 60 to 65%. The left ventricle has normal function. The left ventricle has no regional wall motion abnormalities. The left ventricular internal cavity size was mildly dilated. There is mild left ventricular hypertrophy. Left ventricular diastolic parameters were normal.  2. Right ventricular systolic function is normal. The right ventricular size is normal. There is normal pulmonary artery systolic pressure. The estimated right ventricular systolic pressure is 26.9 mmHg.  3. Left atrial size was mildly dilated.  4. The mitral valve is normal in structure. No evidence of mitral valve regurgitation. No evidence of mitral stenosis.  5. The aortic valve is normal in structure. Aortic valve regurgitation is not visualized. No aortic stenosis is present.  6. The inferior vena  cava is normal in size with greater than 50% respiratory variability, suggesting right atrial pressure of 3 mmHg. FINDINGS  Left Ventricle: Left ventricular ejection fraction, by estimation, is 60 to 65%. The left ventricle has normal function. The left ventricle has no regional wall motion abnormalities. Strain was performed and the global longitudinal strain is indeterminate. The left ventricular internal cavity size was mildly dilated. There is mild left ventricular hypertrophy. Left ventricular diastolic parameters were normal. Right Ventricle: The right ventricular size is normal. No increase in right ventricular wall thickness. Right ventricular systolic function is normal. There is normal pulmonary artery systolic pressure. The tricuspid regurgitant velocity is 2.34 m/s, and  with an assumed right atrial pressure of 5 mmHg, the estimated right ventricular systolic pressure is 26.9 mmHg. Left Atrium: Left atrial size was mildly dilated. Right Atrium: Right atrial size was normal in size. Pericardium: There is no evidence of pericardial effusion. Mitral Valve: The mitral valve is normal in structure. No evidence of mitral valve regurgitation. No evidence of mitral valve stenosis. MV peak gradient, 4.8 mmHg. The mean mitral valve gradient is 2.0 mmHg. Tricuspid Valve: The tricuspid valve is normal in structure. Tricuspid valve regurgitation is mild . No evidence of tricuspid stenosis. Aortic Valve: The aortic valve is normal in structure. Aortic valve regurgitation is not visualized. No aortic stenosis is present. Aortic valve mean gradient measures 7.0 mmHg. Aortic valve peak gradient measures 12.0 mmHg. Aortic valve area, by VTI measures 2.21 cm. Pulmonic Valve: The  pulmonic valve was normal in structure. Pulmonic valve regurgitation is not visualized. No evidence of pulmonic stenosis. Aorta: The aortic root is normal in size and structure. Venous: The inferior vena cava is normal in size with greater than  50% respiratory variability, suggesting right atrial pressure of 3 mmHg. IAS/Shunts: No atrial level shunt detected by color flow Doppler. Additional Comments: 3D was performed not requiring image post processing on an independent workstation and was indeterminate.  LEFT VENTRICLE PLAX 2D LVIDd:         5.40 cm   Diastology LVIDs:         3.10 cm   LV e' medial:    11.30 cm/s LV PW:         1.10 cm   LV E/e' medial:  9.2 LV IVS:        1.40 cm   LV e' lateral:   18.40 cm/s LVOT diam:     2.00 cm   LV E/e' lateral: 5.7 LV SV:         87 LV SV Index:   38 LVOT Area:     3.14 cm  RIGHT VENTRICLE RV Basal diam:  3.80 cm RV Mid diam:    4.20 cm RV S prime:     14.30 cm/s TAPSE (M-mode): 2.4 cm LEFT ATRIUM           Index        RIGHT ATRIUM           Index LA diam:      3.60 cm 1.57 cm/m   RA Area:     19.30 cm LA Vol (A2C): 48.4 ml 21.13 ml/m  RA Volume:   49.60 ml  21.66 ml/m LA Vol (A4C): 75.8 ml 33.10 ml/m  AORTIC VALVE AV Area (Vmax):    2.25 cm AV Area (Vmean):   2.10 cm AV Area (VTI):     2.21 cm AV Vmax:           173.00 cm/s AV Vmean:          119.500 cm/s AV VTI:            0.394 m AV Peak Grad:      12.0 mmHg AV Mean Grad:      7.0 mmHg LVOT Vmax:         124.00 cm/s LVOT Vmean:        79.900 cm/s LVOT VTI:          0.278 m LVOT/AV VTI ratio: 0.70  AORTA Ao Root diam: 3.50 cm MITRAL VALVE                TRICUSPID VALVE MV Area (PHT): 3.70 cm     TR Peak grad:   21.9 mmHg MV Area VTI:   2.07 cm     TR Vmax:        234.00 cm/s MV Peak grad:  4.8 mmHg MV Mean grad:  2.0 mmHg     SHUNTS MV Vmax:       1.10 m/s     Systemic VTI:  0.28 m MV Vmean:      62.8 cm/s    Systemic Diam: 2.00 cm MV Decel Time: 205 msec MV E velocity: 104.00 cm/s MV A velocity: 76.50 cm/s MV E/A ratio:  1.36 Evalene Lunger MD Electronically signed by Evalene Lunger MD Signature Date/Time: 05/16/2024/2:54:52 PM    Final    CT ABDOMEN PELVIS WO CONTRAST Result Date: 05/16/2024 CLINICAL DATA:  Right  lower quadrant pain EXAM: CT  ABDOMEN AND PELVIS WITHOUT CONTRAST TECHNIQUE: Multidetector CT imaging of the abdomen and pelvis was performed following the standard protocol without IV contrast. RADIATION DOSE REDUCTION: This exam was performed according to the departmental dose-optimization program which includes automated exposure control, adjustment of the mA and/or kV according to patient size and/or use of iterative reconstruction technique. COMPARISON:  None Available. FINDINGS: Lower chest: Small left-sided pleural effusion is noted with left basilar atelectasis. Hepatobiliary: Liver is shrunken and nodular consistent with underlying cirrhotic change. Gallbladder is well distended with dependent gallstones. No biliary ductal dilatation is seen. Pancreas: Unremarkable. No pancreatic ductal dilatation or surrounding inflammatory changes. Spleen: Normal in size without focal abnormality. Adrenals/Urinary Tract: Adrenal glands are within normal limits. Kidneys are well visualized bilaterally. No renal calculi or obstructive changes are noted. Bladder is partially distended. Stomach/Bowel: No obstructive or inflammatory changes of the colon are noted. Scattered diverticular changes seen without evidence of diverticulitis. The appendix is not well visualized. No inflammatory changes to suggest appendicitis are noted. Small bowel and stomach are within normal limits. Vascular/Lymphatic: Aortic atherosclerosis. No enlarged abdominal or pelvic lymph nodes. Reproductive: Prostate is unremarkable.  Therapy seeds are noted. Other: Mild to moderate ascites is noted. Musculoskeletal: Degenerative changes of the lumbar spine are noted. IMPRESSION: Changes consistent with cirrhotic change of the liver with associated ascites. Left-sided pleural effusion with lower lobe atelectasis. Diverticular change. Electronically Signed   By: Oneil Devonshire M.D.   On: 05/16/2024 00:01   US  Renal Result Date: 05/15/2024 CLINICAL DATA:  Acute renal insufficiency.  EXAM: RENAL / URINARY TRACT ULTRASOUND COMPLETE COMPARISON:  None Available. FINDINGS: Evaluation is limited due to body habitus and ascites. Right Kidney: Renal measurements: 10.7 x 6.5 x 5.6 cm = volume: 202 mL. Mild parenchyma atrophy and increased echogenicity. No hydronephrosis or shadowing stone. Two small hyperechoic lesions measure up to 1.3 cm suboptimally evaluated due to small size, possibly cysts. Left Kidney: Renal measurements: 10.4 x 6.6 x 5.7 cm = volume: 204 mL. Mild parenchyma atrophy and increased echogenicity. No hydronephrosis or shadowing stone. A 2.7 cm hypoechoic lesion in the upper pole is suboptimally evaluated, likely a cyst. Bladder: The urinary bladder is not visualized. Other: There is a small ascites. IMPRESSION: 1. Mild parenchyma atrophy and increased echogenicity. No hydronephrosis or shadowing stone. 2. Probable small bilateral renal cysts. 3. Small ascites. Electronically Signed   By: Vanetta Chou M.D.   On: 05/15/2024 18:28   DG Chest 2 View Result Date: 05/15/2024 CLINICAL DATA:  Shortness of breath and dizziness for 1 week. EXAM: DG CHEST 2V COMPARISON:  May 03, 2024. FINDINGS: The heart size and mediastinal contours are within normal limits. Small bilateral pleural effusions are noted. No consolidative process is noted. The visualized skeletal structures are unremarkable. IMPRESSION: Small bilateral pleural effusions. Electronically Signed   By: Lynwood Landy Raddle M.D.   On: 05/15/2024 16:15     Medications:    albumin  human 25 g (05/17/24 1205)   azithromycin  500 mg (05/17/24 1202)   cefTRIAXone  (ROCEPHIN )  IV Stopped (05/16/24 2234)   norepinephrine  (LEVOPHED ) Adult infusion Stopped (05/17/24 9092)   phytonadione  (VITAMIN K ) 10 mg in dextrose  5 % 50 mL IVPB Stopped (05/17/24 0955)   vasopressin  0.03 Units/min (05/17/24 1200)    Chlorhexidine  Gluconate Cloth  6 each Topical Daily   heparin   5,000 Units Subcutaneous Q8H   pantoprazole   40 mg Oral Daily    alteplase , docusate sodium , heparin , ipratropium-albuterol , mouth rinse,  polyethylene glycol  Assessment/ Plan:  Mr. JORDY VERBA is a 68 y.o.  male  with past medical conditions including hypertension, COPD, obstructive sleep apnea with CPAP, anemia, and chronic kidney disease stage IV, who was admitted to [facility] on 05/15/2024 for Shock circulatory (HCC) [R57.9] Generalized weakness [R53.1] Hypotension, unspecified hypotension type [I95.9] Acute renal failure, unspecified acute renal failure type (HCC) [N17.9]   Acute kidney injury on chronic kidney disease stage IV. Baseline creatinine 2.77 with GFR 24 06/30/2024. Acute kidney injury likely secondary to hypotension and infectious process. Renal ultrasound negative for obstruction. No recent IV contrast exposure.  Patient received initial dialysis treatment yesterday. Tolerated well. Will receive second treatment today. Is producing more urine, UF . Will assess in am to determine need of dialysis.   Lab Results  Component Value Date   CREATININE 7.87 (H) 05/17/2024   CREATININE 8.16 (H) 05/16/2024   CREATININE 10.51 (H) 05/16/2024    Intake/Output Summary (Last 24 hours) at 05/17/2024 1253 Last data filed at 05/17/2024 1200 Gross per 24 hour  Intake 2303.71 ml  Output 800 ml  Net 1503.71 ml   2.  Acute metabolic acidosis, serum bicarb 14 on admission.  Correcting with dialysis  3. Anemia of chronic kidney disease Lab Results  Component Value Date   HGB 9.1 (L) 05/17/2024    Hgb decreased from 10.8. Will continue to monitor.   4.  Hypotension likely sepsis related, source unknown at this time.  Workup in progress.  Primary team to continue management with pressors, norepinephrine  and vasopressin  currently infusing.  Prescribed broad-spectrum antibiotics at this time.   5. Secondary Hyperparathyroidism: with outpatient labs: PTH 52.7, phosphorus 4.1, calcium  8.6 on 7/2.  Lab Results  Component Value Date   CALCIUM  7.8 (L)  05/17/2024   CAION 1.21 08/08/2023   PHOS 6.3 (H) 05/17/2024    Calcium  remains decreased with elevated phos.    LOS: 2 Ladislao Cohenour 9/19/202512:53 PM

## 2024-05-17 NOTE — Care Management Important Message (Signed)
 Important Message  Patient Details  Name: Kevin Gross MRN: [id number] Date of Birth: [birth year]   Important Message Given:  Yes - Medicare IM     [name] 05/17/2024, 4:19 PM

## 2024-05-17 NOTE — Plan of Care (Signed)
  Problem: Education: Goal: Knowledge of General Education information will improve Description: Including pain rating scale, medication(s)/side effects and non-pharmacologic comfort measures Outcome: Progressing   Problem: Clinical Measurements: Goal: Ability to maintain clinical measurements within normal limits will improve Outcome: Progressing Goal: Diagnostic test results will improve Outcome: Progressing Goal: Respiratory complications will improve Outcome: Progressing Goal: Cardiovascular complication will be avoided Outcome: Progressing   Problem: Activity: Goal: Risk for activity intolerance will decrease Outcome: Progressing   Problem: Coping: Goal: Level of anxiety will decrease Outcome: Progressing   Problem: Elimination: Goal: Will not experience complications related to bowel motility Outcome: Progressing Goal: Will not experience complications related to urinary retention Outcome: Progressing   Problem: Pain Managment: Goal: General experience of comfort will improve and/or be controlled Outcome: Progressing   Problem: Safety: Goal: Ability to remain free from injury will improve Outcome: Progressing   Problem: Skin Integrity: Goal: Risk for impaired skin integrity will decrease Outcome: Progressing   Problem: Clinical Measurements: Goal: Will remain free from infection Outcome: Not Progressing   Problem: Nutrition: Goal: Adequate nutrition will be maintained Outcome: Not Progressing

## 2024-05-17 NOTE — Progress Notes (Signed)
 PHARMACY CONSULT NOTE - ELECTROLYTES  Pharmacy Consult for Electrolyte Monitoring and Replacement   Recent Labs: Height: 5' 9 (175.3 cm) Weight: 114.2 kg (251 lb 12.3 oz) IBW/kg (Calculated) : 70.7 Estimated Creatinine Clearance: 11.5 mL/min (A) (by C-G formula based on SCr of 7.87 mg/dL (H)). Potassium (mmol/L)  Date Value  05/17/2024 4.1   Magnesium  (mg/dL)  Date Value  90/80/7974 2.0   Calcium  (mg/dL)  Date Value  90/80/7974 7.8 (L)   Albumin  (g/dL)  Date Value  90/80/7974 3.2 (L)   Phosphorus (mg/dL)  Date Value  90/80/7974 6.3 (H)   Sodium (mmol/L)  Date Value  05/17/2024 142  10/12/2021 145 (H)   Corrected Ca: 8 mg/dL  Assessment  Kevin Gross is a 68 y.o. male presenting with circulatory shock and AKI. PMH significant for gout, HTN, HLD. COPD, HFpEF, hepatic cirrhosis, CKD stage IV, PAD, alcohol abuse, atrial fibrillation not on AC. Pharmacy has been consulted to monitor and replace electrolytes.  Diet: NPO sips with meds  MIVF: albumin  25% @ 60 mL/hr q6h  Pertinent medications:   Goal of Therapy: Electrolytes WNL  9/19: K 4.1, Mg 2, Phos 6.3  Plan:  No electrolyte replacement warranted at this time  Check BMP, Mg, Phos with AM labs  Thank you for allowing pharmacy to be a part of this patient's care.   Ransom Blanch PGY-1 Pharmacy Resident  [facility]  05/17/2024 7:28 AM

## 2024-05-17 NOTE — Progress Notes (Signed)
 Post Hemodialysis:  Patient dialyzed for 2.5 hours via left femoral triple lumen temp catheter. Access functioned well. No medications given with treatment. Net UF=516ml. Patient tolerated treatment well. Vasopressin  infusion continuously throughout treatment.   05/17/24 1300  Vitals  Temp 97.7 F (36.5 C)  Temp Source Oral  BP (!) 102/49  MAP (mmHg) 65  BP Location Right Arm  BP Method Automatic  Patient Position (if appropriate) Lying  Pulse Rate (!) 57  Pulse Rate Source Monitor  ECG Heart Rate (!) 58  Resp 13  Oxygen Therapy  SpO2 97 %  O2 Device Room Air  Post Treatment  Dialyzer Clearance Lightly streaked  Hemodialysis Intake (mL) 500 mL  Liters Processed 37.5  Fluid Removed (mL) 500 mL  Tolerated HD Treatment Yes  Post-Hemodialysis Comments Patient tolerated 2nd dialysis treatment well, no adverse effects noted.  CVC Triple Lumen 05/16/24 Left Femoral  Placement Date/Time: 05/16/24 0946   Maximum sterile barrier precautions: Hand hygiene;Sterile gloves;Cap;Large sterile sheet;Sterile probe cover;Mask;Sterile gown  Site Prep: Chlorhexidine  (preferred);Skin Prep Completely Dry at the Time of First Ski...  Indication for Insertion or Continuance of Line Hemodialysis;Vasoactive infusions  Site Assessment Clean, Dry, Intact  Proximal Lumen Status Flushed;Dead end cap in place;Heparin  locked  Medial Lumen Status Flushed;Dead end cap in place;Heparin  locked  Distal Lumen Status Infusing (Primary team utilizing)  Dressing Type Transparent  Dressing Status Antimicrobial disc/dressing in place  Line Care Connections checked and tightened  Dressing Change Due 05/23/24

## 2024-05-17 NOTE — Progress Notes (Signed)
 NAME:  Kevin Gross, MRN:  [id number], DOB:  04/10/1956, LOS: 2 ADMISSION DATE:  05/15/2024, CONSULTATION DATE:  05/15/2024 REFERRING MD:  Dr. Willo, CHIEF COMPLAINT:  Shortness of breath, Hypotension    Brief Pt Description / Synopsis:  68 year old male with history of ascites presenting with weakness after recent diarrheal illness. He is found to be in circulatory shock with decompensated liver cirrhosis and acute renal failure. He is admitted to the ICU for vasopressor support and is currently initiated on renal replacement therapy.   History of Present Illness:  68 yo M presenting to [facility] ED from home for evaluation of SOB and progressive weakness.   History obtained per chart review and patient bedside report. Patient reports progressive weakness/ dizziness, decreased urinary output and poor PO intake over the last 2 weeks. This with correlating, multiple episodes of nausea/vomiting and diarrhea until 1-2 days ago (9/15-9/16). He denies noting any blood in his emesis or stool. He also endorses RLQ mild abdominal tenderness as well as increasing abdominal distension. He has also noted dyspnea with exertion over the last month or two. He denies chest pain, new cough, recent falls, LOC, blurred vision, dysuria. He reports his HR is chronically in the 40's-50's, which does correlate with my chart review.   Of note upon arrival in ED he denied any GI symptoms or new distension, also reporting his UOP as normal. Daughter reported to EDP that the patient has continued to drink fluids but has not eaten very much.  He reported continued abstinence from alcohol and tobacco products, he denied recreational drug use. And he is aware he should not use NSAID medications.   ED course: Upon arrival, patient alert and responsive, with chronic bradycardia and acutely soft hemodynamics. Labs significant for AGMA, acute renal failure, Transaminitis, mildly elevated BNP and mildly elevated but flat troponin  suggestive of slight demand ischemia with leukocytosis. Imaging revealed small bilateral pleural effusions and small ascites with small ascites. Patient became hypotensive which responded to IVF bolus, then dropped again facilitating the initiation of peripheral vasopressor support.  PCCM asked to admit for further workup and treatment.  Please see Significant Hospital Events section below for full detailed hospital course.   Pertinent  Medical History  CKD Stage 4 Alcoholic Liver Cirrhosis COPD HLD HTN PAD OSA on CPAP Prostate Cancer Gout Prior ETOH abuse Iron  Deficiency Anemia Former smoker Bradycardia HFpEF (02/2024: LVEF 60-65%, G2DD)  Micro Data:  9/17: COVID-19 PCR>> negative  9/17: MRSA PCR>> negative 9/18: Peritoneal fluid>> no growth to date 9/18: Blood cultures x2>> no growth to date  9/19: GI panel PCR>> negative   Antimicrobials:   Anti-infectives (From admission, onward)    Start     Dose/Rate Route Frequency Ordered Stop   05/16/24 2200  cefTRIAXone  (ROCEPHIN ) 2 g in sodium chloride  0.9 % 100 mL IVPB        2 g 200 mL/hr over 30 Minutes Intravenous Every 24 hours 05/16/24 0705     05/16/24 1200  azithromycin  (ZITHROMAX ) 500 mg in sodium chloride  0.9 % 250 mL IVPB        500 mg 250 mL/hr over 60 Minutes Intravenous Every 24 hours 05/16/24 1029     05/16/24 0030  cefTRIAXone  (ROCEPHIN ) 2 g in sodium chloride  0.9 % 100 mL IVPB        2 g 200 mL/hr over 30 Minutes Intravenous  Once 05/15/24 2333 05/16/24 0022       Significant Hospital Events: Including procedures,  antibiotic start and stop dates in addition to other pertinent events   05/15/24: Admit patient to ICU due to circulatory shock s/t unclear etiology in the setting of AKI on CKD stage 4 requiring vasopressor support. 05/16/24: remains on pressors 05/17/24: No significant events overnight.  Afebrile, remains on Levophed  and Vasopressin  (Levo requirements much improved), Peritoneal fluid not  consistent with SBP.  Plan for HD today at bedside.   Interim History / Subjective:  As outlined above under Significant Hospital Events section  Objective   Blood pressure (!) 111/45, pulse (!) 57, temperature 98.3 F (36.8 C), temperature source Oral, resp. rate 15, height 5' 9 (1.753 m), weight 114.2 kg, SpO2 95%.    FiO2 (%):  [21 %] 21 %   Intake/Output Summary (Last 24 hours) at 05/17/2024 0810 Last data filed at 05/17/2024 0700 Gross per 24 hour  Intake 2664.91 ml  Output 700 ml  Net 1964.91 ml   Filed Weights   05/16/24 1800 05/16/24 2118 05/17/24 0449  Weight: 115.9 kg 114.2 kg 114.2 kg    Examination: General: Acutely ill appearing male, laying in bed, on room air, in NAD HENT: Atraumatic, normocephalic, neck supple, no JVD Lungs: Clear breath sounds throughout, even, nonlabored, normal effort Cardiovascular: RRR, s1s2, no M/R/G Abdomen: Obese, distended, soft, nontender, no guarding or rebound tenderness, BS+ x4 Extremities: Bilateral LE edema Neuro: Awake and alert, follows commands, no focal deficits noted GU: Deferred   Resolved Hospital Problem list     Assessment & Plan:   #Shock: Hypovolemic +/- Septic #HFpEF PMHx: Atrial Fibrillation, PAD, HTN Echocardiogram 05/16/24: LVEF 60-65%,  normal diastolic parameters, RV systolic function normal, RV size normal -Continuous cardiac monitoring -Maintain MAP >65 -Continue IV Albumin  25g q6h -Vasopressors as needed to maintain MAP goal -Cortisol normal at 10.9 -Consider Stress dose steroids  -Lactic acid has normalized -Volume removal with Dialysis   #Acute Decompensated Liver Cirrhosis with ascites  -Follow LFT's and coags -SBP ruled out   #AKI on CKD, questionable Hepatorenal Syndrome  #Anion Gap Metabolic Acidosis -Monitor I&O's / urinary output -Follow BMP -Ensure adequate renal perfusion -Avoid nephrotoxic agents as able -Replace electrolytes as indicated ~ Pharmacy following for assistance with  electrolyte replacement -Nephrology following, appreciate input ~ Dialysis as per Nephrology   #Questionable CAP #Questionable SBP ~ Peritoneal fluid from 9/18 not consistent with SBP  -Monitor fever curve -Trend WBC's  -Follow cultures as above -Continue empiric Azithromycin  & Ceftriaxone  pending cultures & sensitivities  #Normocytic Normochromic Anemia without s/sx of bleeding #Thrombocytopenia -Monitor for S/Sx of bleeding -Trend CBC -Heparin  SQ for VTE Prophylaxis  -Transfuse for Hgb <7 -Transfuse platelets for Platelet count: <10K; <50 K with active bleeding; <100K with Neurosurgical procedures/processes -Continue Vitamin K      Best Practice (right click and Reselect all SmartList Selections daily)   Diet/type: Regular consistency (see orders) DVT prophylaxis: prophylactic heparin   GI prophylaxis: PPI Lines: Central line and yes and it is still needed Foley:  N/A Code Status:  full code Last date of multidisciplinary goals of care discussion [9/19]  9/19: Pt updated at bedside on plan of care.  Labs   CBC: Recent Labs  Lab 05/15/24 1528 05/16/24 0441 05/17/24 0544  WBC 13.3* 18.2* 10.9*  HGB 11.4* 10.8* 9.1*  HCT 36.4* 33.8* 27.2*  MCV 102.8* 98.5 96.8  PLT 186 211 123*    Basic Metabolic Panel: Recent Labs  Lab 05/15/24 1528 05/15/24 2148 05/16/24 0441 05/16/24 1128 05/16/24 2056 05/17/24 0544  NA 142 143 143  142 140 142  K 5.0 5.1 4.8 4.7 4.3 4.1  CL 109 113* 117* 111 107 106  CO2 14* 15* 16* 15* 19* 20*  GLUCOSE 102* 103* 104* 109* 120* 121*  BUN 117* 114* 115* 118* 86* 91*  CREATININE 11.33* 11.02* 10.89* 10.51* 8.16* 7.87*  CALCIUM  8.2* 7.8* 7.6* 7.7* 7.6* 7.8*  MG 2.4  --  2.5*  --   --  2.0  PHOS  --   --  8.5*  --   --  6.3*   GFR: Estimated Creatinine Clearance: 11.5 mL/min (A) (by C-G formula based on SCr of 7.87 mg/dL (H)). Recent Labs  Lab 05/15/24 1528 05/15/24 2150 05/16/24 0441 05/16/24 1056 05/17/24 0544  WBC 13.3*   --  18.2*  --  10.9*  LATICACIDVEN  --  1.5  --  1.4  --     Liver Function Tests: Recent Labs  Lab 05/15/24 1528 05/16/24 0441 05/17/24 0544  AST 98* 95* 95*  ALT 49* 47* 41  ALKPHOS 187* 183* 128*  BILITOT 1.2 1.3* 1.7*  PROT 6.1* 5.8* 5.8*  ALBUMIN  2.3* 2.2* 3.2*   No results for input(s): LIPASE, AMYLASE in the last 168 hours. Recent Labs  Lab 05/16/24 1128  AMMONIA 67*    ABG    Component Value Date/Time   PHART 7.298 (L) 11/27/2021 0345   PCO2ART 39.7 11/27/2021 0345   PO2ART 116 (H) 11/27/2021 0345   HCO3 14.1 (L) 05/16/2024 0441   TCO2 21 (L) 08/08/2023 0916   ACIDBASEDEF 11.3 (H) 05/16/2024 0441   O2SAT 69.2 05/16/2024 0441     Coagulation Profile: Recent Labs  Lab 05/16/24 1658  INR 1.4*    Cardiac Enzymes: No results for input(s): CKTOTAL, CKMB, CKMBINDEX, TROPONINI in the last 168 hours.  HbA1C: Hgb A1c MFr Bld  Date/Time Value Ref Range Status  11/27/2021 03:28 AM 5.6 4.8 - 5.6 % Final    Comment:    (NOTE) Pre diabetes:          5.7%-6.4%  Diabetes:              >6.4%  Glycemic control for   <7.0% adults with diabetes     CBG: Recent Labs  Lab 05/15/24 2047 05/16/24 1605  GLUCAP 80 120*    Review of Systems:   Positives in BOLD:  Gen: Denies fever, chills, weight change, fatigue, night sweats HEENT: Denies blurred vision, double vision, hearing loss, tinnitus, sinus congestion, rhinorrhea, sore throat, neck stiffness, dysphagia PULM: Denies shortness of breath, cough, sputum production, hemoptysis, wheezing CV: Denies chest pain, edema, orthopnea, paroxysmal nocturnal dyspnea, palpitations GI: Denies abdominal pain, nausea, vomiting, diarrhea, hematochezia, melena, constipation, change in bowel habits GU: Denies dysuria, hematuria, polyuria, oliguria, urethral discharge Endocrine: Denies hot or cold intolerance, polyuria, polyphagia or appetite change Derm: Denies rash, dry skin, scaling or peeling skin  change Heme: Denies easy bruising, bleeding, bleeding gums Neuro: Denies headache, numbness, weakness, slurred speech, loss of memory or consciousness   Past Medical History:  He,  has a past medical history of Alcoholic cirrhosis of liver (HCC), Anemia (2024), Cataract (2024), CHF (congestive heart failure) (HCC), Chronic gouty arthritis, CKD (chronic kidney disease), stage III (HCC), Clotting disorder (HCC), Complication of anesthesia, COPD (chronic obstructive pulmonary disease) (HCC), Hyperlipidemia, Hypertension, Hyperuricemia, Peripheral arterial disease (HCC), Pre-diabetes, Prostate cancer (HCC) (2023), and Sleep apnea.   Surgical History:   Past Surgical History:  Procedure Laterality Date   ABDOMINAL AORTOGRAM W/LOWER EXTREMITY N/A 10/20/2021   Procedure:  ABDOMINAL AORTOGRAM W/LOWER EXTREMITY;  Surgeon: Darron Deatrice LABOR, MD;  Location: [facility];  Service: Cardiovascular;  Laterality: N/A;   ABDOMINAL AORTOGRAM W/LOWER EXTREMITY N/A 08/08/2023   Procedure: ABDOMINAL AORTOGRAM W/LOWER EXTREMITY;  Surgeon: Serene Gaile ORN, MD;  Location: [facility];  Service: Cardiovascular;  Laterality: N/A;   ALLOGRAFT APPLICATION Bilateral 12/22/2021   Procedure: ALLOGRAFT APPLICATION;  Surgeon: Serene Gaile ORN, MD;  Location[facility] OR;  Service: Vascular;  Laterality: Bilateral;   ANGIOPLASTY Left 11/26/2021   Procedure: LEFT INFRARENAL LITHOTRIPSY;  Surgeon: Serene Gaile ORN, MD;  Location: [facility] OR;  Service: Vascular;  Laterality: Left;   APPLICATION OF WOUND VAC Bilateral 12/13/2021   Procedure: APPLICATION OF WOUND VAC;  Surgeon: Lanis Fonda BRAVO, MD;  Location: [facility] OR;  Service: Vascular;  Laterality: Bilateral;   CARDIAC CATHETERIZATION     [facility]   CATARACT EXTRACTION W/PHACO Left 04/04/2023   Procedure: CATARACT EXTRACTION PHACO AND INTRAOCULAR LENS PLACEMENT (IOC) LEFT 8.71 00:45.5;  Surgeon: Jaye Fallow, MD;  Location: [facility];  Service: Ophthalmology;   Laterality: Left;   CATARACT EXTRACTION W/PHACO Right 04/18/2023   Procedure: CATARACT EXTRACTION PHACO AND INTRAOCULAR LENS PLACEMENT (IOC) RIGHT 4.31 00:8.2;  Surgeon: Jaye Fallow, MD;  Location: [facility];  Service: Ophthalmology;  Laterality: Right;   COLONOSCOPY     COLONOSCOPY WITH PROPOFOL  N/A 01/17/2018   Procedure: COLONOSCOPY WITH PROPOFOL ;  Surgeon: Toledo, Ladell POUR, MD;  Location: [facility];  Service: Gastroenterology;  Laterality: N/A;   COLONOSCOPY WITH PROPOFOL  N/A 09/12/2022   Procedure: COLONOSCOPY WITH PROPOFOL ;  Surgeon: Maryruth Ole DASEN, MD;  Location: [facility];  Service: Endoscopy;  Laterality: N/A;   COLONOSCOPY WITH PROPOFOL  N/A 09/05/2023   Procedure: COLONOSCOPY WITH PROPOFOL ;  Surgeon: Maryruth Ole DASEN, MD;  Location: [facility];  Service: Endoscopy;  Laterality: N/A;   ENDARTERECTOMY FEMORAL Bilateral 11/26/2021   Procedure: BILATERAL FEMORAL ENDARTERECTOMY WITH VEIN PATCH ANGIOPLASTY;  Surgeon: Serene Gaile ORN, MD;  Location: [facility] OR;  Service: Vascular;  Laterality: Bilateral;   ENTEROSCOPY N/A 09/05/2023   Procedure: ENTEROSCOPY;  Surgeon: Maryruth Ole DASEN, MD;  Location: [facility];  Service: Endoscopy;  Laterality: N/A;   ESOPHAGOGASTRODUODENOSCOPY (EGD) WITH PROPOFOL  N/A 01/17/2018   Procedure: ESOPHAGOGASTRODUODENOSCOPY (EGD) WITH PROPOFOL ;  Surgeon: Toledo, Ladell POUR, MD;  Location: [facility];  Service: Gastroenterology;  Laterality: N/A;   ESOPHAGOGASTRODUODENOSCOPY (EGD) WITH PROPOFOL  N/A 09/12/2022   Procedure: ESOPHAGOGASTRODUODENOSCOPY (EGD) WITH PROPOFOL ;  Surgeon: Maryruth Ole DASEN, MD;  Location: [facility];  Service: Endoscopy;  Laterality: N/A;   EYE SURGERY     GROIN DEBRIDEMENT Bilateral 12/16/2021   Procedure: IRRIGATION AND DEBRIDEMENT OF BILATERAL GROINS;  Surgeon: Serene Gaile ORN, MD;  Location: [facility];  Service: Vascular;  Laterality: Bilateral;   INCISION AND DRAINAGE OF WOUND Bilateral  12/13/2021   Procedure: IRRIGATION AND DEBRIDEMENT OF BILATERAL GROIN WOUNDS;  Surgeon: Lanis Fonda BRAVO, MD;  Location: [facility] OR;  Service: Vascular;  Laterality: Bilateral;   INSERTION OF ILIAC STENT Bilateral 11/26/2021   Procedure: BILATERAL ILIAC STENTING USING 40mmX80mm INNOVA STENT ON LEFT ILIAC AND 70mmX59mm, 46mmX79mm VBX AND 47mmX7.5cm VIABAHN STENT ON RIGHT ILIAC;  Surgeon: Serene Gaile ORN, MD;  Location: [facility] OR;  Service: Vascular;  Laterality: Bilateral;   INSERTION OF SEEDS IN PROSTATE     ORIF ANKLE FRACTURE Right    PERIPHERAL INTRAVASCULAR LITHOTRIPSY  08/08/2023   Procedure: PERIPHERAL INTRAVASCULAR LITHOTRIPSY;  Surgeon: Serene Gaile ORN, MD;  Location: [facility];  Service: Cardiovascular;;  PROSTATE BIOPSY N/A 04/01/2021   Procedure: PROSTATE BIOPSY GRAYCE;  Surgeon: Kassie Ozell SAUNDERS, MD;  Location: [facility] ORS;  Service: Urology;  Laterality: N/A;   VEIN HARVEST Bilateral 11/26/2021   Procedure: VEIN HARVEST OF BILATERAL SPAHENOUS VEINS;  Surgeon: Serene Gaile ORN, MD;  Location: [facility];  Service: Vascular;  Laterality: Bilateral;   WOUND DEBRIDEMENT Bilateral 12/22/2021   Procedure: INCISION AND DEBRIDEMENT OF BILATERAL GROINS;  Surgeon: Serene Gaile ORN, MD;  Location: [facility];  Service: Vascular;  Laterality: Bilateral;     Social History:   reports that he quit smoking about 9 years ago. His smoking use included cigarettes. He started smoking about 56 years ago. He has a 47 pack-year smoking history. He has never been exposed to tobacco smoke. He has never used smokeless tobacco. He reports that he does not currently use alcohol. He reports that he does not use drugs.   Family History:  His family history includes Heart disease in his brother and mother.   Allergies Allergies  Allergen Reactions   Jardiance [Empagliflozin]     Loss of balance, hypoglycemic      Home Medications  Prior to Admission medications   Medication Sig Start Date End Date Taking?  Authorizing Provider  allopurinol  (ZYLOPRIM ) 100 MG tablet Take 150 mg by mouth daily.   Yes [provider]  amiodarone  (PACERONE ) 200 MG tablet Take 200 mg by mouth 2 (two) times daily.   Yes [provider]  amoxicillin-clavulanate (AUGMENTIN) 500-125 MG tablet Take 1 tablet by mouth 2 (two) times daily. 05/06/24  Yes [provider]  aspirin  EC 81 MG tablet Take 81 mg by mouth every morning.   Yes [provider]  chlorthalidone  (HYGROTON ) 25 MG tablet Take 25 mg by mouth daily.   Yes [provider]  Cholecalciferol  (VITAMIN D3) 25 MCG (1000 UT) CAPS Take 2,000 Units by mouth daily.   Yes [provider]  clopidogrel  (PLAVIX ) 75 MG tablet Take 1 tablet (75 mg total) by mouth daily. 01/16/23  Yes Baglia, Corrina, PA-C  ezetimibe  (ZETIA ) 10 MG tablet Take 1 tablet (10 mg total) by mouth daily. 04/10/18  Yes Gollan, Timothy J, MD  iron  polysaccharides (NIFEREX) 150 MG capsule Take 1 capsule (150 mg total) by mouth 2 (two) times daily. 03/11/24 06/09/24 Yes Sreenath, Sudheer B, MD  lisinopril  (ZESTRIL ) 30 MG tablet Take 30 mg by mouth daily.   Yes [provider]  simvastatin  (ZOCOR ) 20 MG tablet Take 20 mg by mouth in the morning.   Yes [provider]  triamcinolone  ointment (KENALOG ) 0.1 % Apply 1 Application topically 2 (two) times daily as needed (psoriasis). 07/18/23  Yes [provider]  zolpidem  (AMBIEN ) 10 MG tablet Take 10 mg by mouth at bedtime.   Yes [provider]  pantoprazole  (PROTONIX ) 40 MG tablet Take 40 mg by mouth daily.    [provider]     Critical care time: 40 minutes     Inge Lecher, AGACNP-BC [facility] Prefer epic messenger for cross cover needs If after hours, please call E-link

## 2024-05-18 ENCOUNTER — Encounter: Payer: Self-pay | Admitting: Student in an Organized Health Care Education/Training Program

## 2024-05-18 LAB — CBC
HCT: 27.8 % — ABNORMAL LOW (ref 39.0–52.0)
Hemoglobin: 9.1 g/dL — ABNORMAL LOW (ref 13.0–17.0)
MCH: 32 pg (ref 26.0–34.0)
MCHC: 32.7 g/dL (ref 30.0–36.0)
MCV: 97.9 fL (ref 80.0–100.0)
Platelets: 108 K/uL — ABNORMAL LOW (ref 150–400)
RBC: 2.84 MIL/uL — ABNORMAL LOW (ref 4.22–5.81)
RDW: 23.1 % — ABNORMAL HIGH (ref 11.5–15.5)
WBC: 9.4 K/uL (ref 4.0–10.5)
nRBC: 0 % (ref 0.0–0.2)

## 2024-05-18 LAB — PHOSPHORUS: Phosphorus: 4.5 mg/dL (ref 2.5–4.6)

## 2024-05-18 LAB — CREATININE, URINE, RANDOM: Creatinine, Urine: 174 mg/dL

## 2024-05-18 LAB — BASIC METABOLIC PANEL WITH GFR
Anion gap: 17 — ABNORMAL HIGH (ref 5–15)
BUN: 64 mg/dL — ABNORMAL HIGH (ref 8–23)
CO2: 22 mmol/L (ref 22–32)
Calcium: 8 mg/dL — ABNORMAL LOW (ref 8.9–10.3)
Chloride: 101 mmol/L (ref 98–111)
Creatinine, Ser: 5.73 mg/dL — ABNORMAL HIGH (ref 0.61–1.24)
GFR, Estimated: 10 mL/min — ABNORMAL LOW (ref >60)
Glucose, Bld: 112 mg/dL — ABNORMAL HIGH (ref 70–99)
Potassium: 3.8 mmol/L (ref 3.5–5.1)
Sodium: 140 mmol/L (ref 135–145)

## 2024-05-18 LAB — PROTIME-INR
INR: 1.5 — ABNORMAL HIGH (ref 0.8–1.2)
Prothrombin Time: 18.7 s — ABNORMAL HIGH (ref 11.4–15.2)

## 2024-05-18 LAB — SODIUM, URINE, RANDOM: Sodium, Ur: <30 mmol/L

## 2024-05-18 LAB — BLOOD GAS, VENOUS
Acid-base deficit: 12.7 mmol/L — ABNORMAL HIGH (ref 0.0–2.0)
Bicarbonate: 14.5 mmol/L — ABNORMAL LOW (ref 20.0–28.0)
O2 Saturation: 46.4 %
Patient temperature: 37
pCO2, Ven: 37 mmHg — ABNORMAL LOW (ref 44–60)
pH, Ven: 7.2 — ABNORMAL LOW (ref 7.25–7.43)

## 2024-05-18 LAB — APTT: aPTT: 50 s — ABNORMAL HIGH (ref 24–36)

## 2024-05-18 LAB — MAGNESIUM: Magnesium: 1.9 mg/dL (ref 1.7–2.4)

## 2024-05-18 LAB — HEPARIN LEVEL (UNFRACTIONATED): Heparin Unfractionated: 0.29 [IU]/mL — ABNORMAL LOW (ref 0.30–0.70)

## 2024-05-18 MED ORDER — SODIUM CHLORIDE 0.9 % IV SOLN
50.0000 ug/h | INTRAVENOUS | Status: AC
  Administered 2024-05-18 – 2024-05-21 (×6): 50 ug/h via INTRAVENOUS
  Filled 2024-05-18 (×11): qty 1

## 2024-05-18 MED ORDER — HEPARIN BOLUS VIA INFUSION
5000.0000 [IU] | Freq: Once | INTRAVENOUS | Status: AC
  Administered 2024-05-18: 5000 [IU] via INTRAVENOUS
  Filled 2024-05-18: qty 5000

## 2024-05-18 MED ORDER — OCTREOTIDE LOAD VIA INFUSION
50.0000 ug | Freq: Once | INTRAVENOUS | Status: AC
  Administered 2024-05-18: 50 ug via INTRAVENOUS
  Filled 2024-05-18: qty 25

## 2024-05-18 MED ORDER — ALTEPLASE 2 MG IJ SOLR: 2.0000 mg | Freq: Once | INTRAMUSCULAR | Status: DC | PRN

## 2024-05-18 MED ORDER — HEPARIN SODIUM (PORCINE) 1000 UNIT/ML DIALYSIS
1000.0000 [IU] | INTRAMUSCULAR | Status: DC | PRN
  Administered 2024-05-21: 2800 [IU]

## 2024-05-18 MED ORDER — HEPARIN BOLUS VIA INFUSION
1450.0000 [IU] | Freq: Once | INTRAVENOUS | Status: AC
Start: 2024-05-18 — End: 2024-05-18
  Administered 2024-05-18: 1450 [IU] via INTRAVENOUS
  Filled 2024-05-18: qty 1450

## 2024-05-18 MED ORDER — HEPARIN (PORCINE) 25000 UT/250ML-% IV SOLN
1650.0000 [IU]/h | INTRAVENOUS | Status: DC
  Administered 2024-05-18: 1500 [IU]/h via INTRAVENOUS
  Filled 2024-05-18 (×2): qty 250

## 2024-05-18 MED ORDER — MELATONIN 5 MG PO TABS
5.0000 mg | ORAL_TABLET | Freq: Every day | ORAL | Status: DC
  Administered 2024-05-18 – 2024-05-23 (×6): 5 mg via ORAL
  Filled 2024-05-18 (×6): qty 1

## 2024-05-18 MED ORDER — CHLORHEXIDINE GLUCONATE CLOTH 2 % EX PADS
6.0000 | MEDICATED_PAD | Freq: Every day | CUTANEOUS | Status: DC
  Administered 2024-05-19 – 2024-05-24 (×7): 6 via TOPICAL

## 2024-05-18 MED ORDER — CHLORHEXIDINE GLUCONATE CLOTH 2 % EX PADS: 6.0000 | MEDICATED_PAD | Freq: Every day | CUTANEOUS | Status: DC

## 2024-05-18 MED ORDER — ALBUMIN HUMAN 25 % IV SOLN
12.5000 g | Freq: Once | INTRAVENOUS | Status: AC
  Administered 2024-05-18: 12.5 g via INTRAVENOUS
  Filled 2024-05-18: qty 50

## 2024-05-18 NOTE — Plan of Care (Signed)
 Continuing with plan of care.

## 2024-05-18 NOTE — Progress Notes (Signed)
 [facility]  ROUNDING NOTE   Subjective:   Patient underwent hemodialysis treatment yesterday and the day before. Creatinine improved down to 5.7. Urine output only 600 cc over the preceding 24 hours. Currently off pressors.  09/19 0701 - 09/20 0700 In: 922 [I.V.:176.8; IV Piggyback:745.2] Out: 1100 [Urine:600] Lab Results  Component Value Date   CREATININE 5.73 (H) 05/18/2024   CREATININE 7.87 (H) 05/17/2024   CREATININE 8.16 (H) 05/16/2024      Objective:  Vital signs in last 24 hours:  Temp:  [97.7 F (36.5 C)-98.6 F (37 C)] 97.8 F (36.6 C) (09/20 0840) Pulse Rate:  [54-65] 59 (09/20 1000) Resp:  [9-22] 15 (09/20 1000) BP: (77-133)/(37-76) 119/57 (09/20 1000) SpO2:  [90 %-100 %] 97 % (09/20 1000) FiO2 (%):  [21 %] 21 % (09/20 0000) Weight:  [108.8 kg-114.5 kg] 114.5 kg (09/20 0500)  Weight change: -6.6 kg Filed Weights   05/17/24 0945 05/17/24 1300 05/18/24 0500  Weight: 109.2 kg 108.8 kg 114.5 kg    Intake/Output: I/O last 3 completed shifts: In: 2049.2 [I.V.:963.3; IV Piggyback:1085.9] Out: 1700 [Urine:1200; Other:500]   Intake/Output this shift:  Total I/O In: 5.6 [IV Piggyback:5.6] Out: -   Physical Exam: General: NAD  Head: Normocephalic, atraumatic. Moist oral mucosal membranes  Eyes: Anicteric  Neck: Supple  Lungs:  Clear to auscultation  Heart: Regular rate and rhythm  Abdomen:  Soft, nontender  Extremities: 1+ peripheral edema.  Neurologic: Awake, alert, conversant  Skin: Warm,dry, no rash  Access: Lt femoral Trialysis cath    Basic Metabolic Panel: Recent Labs  Lab 05/15/24 1528 05/15/24 2148 05/16/24 0441 05/16/24 1128 05/16/24 2056 05/17/24 0544 05/18/24 0447  NA 142   < > 143 142 140 142 140  K 5.0   < > 4.8 4.7 4.3 4.1 3.8  CL 109   < > 117* 111 107 106 101  CO2 14*   < > 16* 15* 19* 20* 22  GLUCOSE 102*   < > 104* 109* 120* 121* 112*  BUN 117*   < > 115* 118* 86* 91* 64*  CREATININE 11.33*   < > 10.89*  10.51* 8.16* 7.87* 5.73*  CALCIUM  8.2*   < > 7.6* 7.7* 7.6* 7.8* 8.0*  MG 2.4  --  2.5*  --   --  2.0 1.9  PHOS  --   --  8.5*  --   --  6.3* 4.5   < > = values in this interval not displayed.    Liver Function Tests: Recent Labs  Lab 05/15/24 1528 05/16/24 0441 05/17/24 0544  AST 98* 95* 95*  ALT 49* 47* 41  ALKPHOS 187* 183* 128*  BILITOT 1.2 1.3* 1.7*  PROT 6.1* 5.8* 5.8*  ALBUMIN  2.3* 2.2* 3.2*   No results for input(s): LIPASE, AMYLASE in the last 168 hours. Recent Labs  Lab 05/16/24 1128  AMMONIA 67*    CBC: Recent Labs  Lab 05/15/24 1528 05/16/24 0441 05/17/24 0544 05/18/24 0447  WBC 13.3* 18.2* 10.9* 9.4  HGB 11.4* 10.8* 9.1* 9.1*  HCT 36.4* 33.8* 27.2* 27.8*  MCV 102.8* 98.5 96.8 97.9  PLT 186 211 123* 108*    Cardiac Enzymes: No results for input(s): CKTOTAL, CKMB, CKMBINDEX, TROPONINI in the last 168 hours.  BNP: Invalid input(s): POCBNP  CBG: Recent Labs  Lab 05/15/24 2047 05/16/24 1605  GLUCAP 80 120*    Microbiology: Results for orders placed or performed during the hospital encounter of 05/15/24  MRSA Next Gen by PCR,  Nasal     Status: None   Collection Time: 05/15/24  8:57 PM   Specimen: Nasal Mucosa; Nasal Swab  Result Value Ref Range Status   MRSA by PCR Next Gen NOT DETECTED NOT DETECTED Final    Comment: (NOTE) The GeneXpert MRSA Assay (FDA approved for NASAL specimens only), is one component of a comprehensive MRSA colonization surveillance program. It is not intended to diagnose MRSA infection nor to guide or monitor treatment for MRSA infections. Test performance is not FDA approved in patients less than 59 years old. Performed at [facility], [address] [place], [place] [address]   SARS Coronavirus 2 by RT PCR (hospital order, performed in [facility] hospital lab) *cepheid single result test* Anterior Nasal Swab     Status: None   Collection Time: 05/15/24  8:58 PM   Specimen: Anterior  Nasal Swab  Result Value Ref Range Status   SARS Coronavirus 2 by RT PCR NEGATIVE NEGATIVE Final    Comment: (NOTE) SARS-CoV-2 target nucleic acids are NOT DETECTED.  The SARS-CoV-2 RNA is generally detectable in upper and lower respiratory specimens during the acute phase of infection. The lowest concentration of SARS-CoV-2 viral copies this assay can detect is 250 copies / mL. A negative result does not preclude SARS-CoV-2 infection and should not be used as the sole basis for treatment or other patient management decisions.  A negative result may occur with improper specimen collection / handling, submission of specimen other than nasopharyngeal swab, presence of viral mutation(s) within the areas targeted by this assay, and inadequate number of viral copies (<250 copies / mL). A negative result must be combined with clinical observations, patient history, and epidemiological information.  Fact Sheet for Patients:   RoadLapTop.co.za  Fact Sheet for Healthcare Providers: http://kim-miller.com/  This test is not yet approved or  cleared by the [place]  FDA and has been authorized for detection and/or diagnosis of SARS-CoV-2 by FDA under an Emergency Use Authorization (EUA).  This EUA will remain in effect (meaning this test can be used) for the duration of the COVID-19 declaration under Section 564(b)(1) of the Act, 21 U.S.C. section 360bbb-3(b)(1), unless the authorization is terminated or revoked sooner.  Performed at [facility], [address] [place], [place] [address]   Body fluid culture w Gram Stain     Status: None (Preliminary result)   Collection Time: 05/16/24  2:50 PM   Specimen: PATH Cytology Peritoneal fluid  Result Value Ref Range Status   Specimen Description   Final    PERITONEAL Performed at [facility], [address], [place], [place] [address]    Special Requests   Final     NONE Performed at [facility], [address] [place], [place] [address]    Gram Stain   Final    WBC PRESENT, PREDOMINANTLY MONONUCLEAR NO ORGANISMS SEEN CYTOSPIN SMEAR    Culture   Final    NO GROWTH < 12 HOURS Performed at [facility], [address] [place], [place] [address]    Report Status PENDING  Incomplete  Culture, blood (Routine X 2) w Reflex to ID Panel     Status: None (Preliminary result)   Collection Time: 05/16/24  6:15 PM   Specimen: BLOOD  Result Value Ref Range Status   Specimen Description BLOOD BLOOD RIGHT ARM  Final   Special Requests   Final    BOTTLES DRAWN AEROBIC AND ANAEROBIC Blood Culture adequate volume  Culture   Final    NO GROWTH 2 DAYS Performed at [facility], [address] [place], [place] [address]    Report Status PENDING  Incomplete  Culture, blood (Routine X 2) w Reflex to ID Panel     Status: None (Preliminary result)   Collection Time: 05/16/24  6:28 PM   Specimen: BLOOD  Result Value Ref Range Status   Specimen Description BLOOD BLOOD RIGHT ARM  Final   Special Requests   Final    BOTTLES DRAWN AEROBIC AND ANAEROBIC Blood Culture adequate volume   Culture   Final    NO GROWTH 2 DAYS Performed at [facility], [address], [place], [place] [address]    Report Status PENDING  Incomplete  Gastrointestinal Panel by PCR , Stool     Status: None   Collection Time: 05/17/24  3:20 AM   Specimen: Stool  Result Value Ref Range Status   Campylobacter species NOT DETECTED NOT DETECTED Final   Plesimonas shigelloides NOT DETECTED NOT DETECTED Final   Salmonella species NOT DETECTED NOT DETECTED Final   Yersinia enterocolitica NOT DETECTED NOT DETECTED Final   Vibrio species NOT DETECTED NOT DETECTED Final   Vibrio cholerae NOT DETECTED NOT DETECTED Final   Enteroaggregative E coli (EAEC) NOT DETECTED NOT DETECTED Final   Enteropathogenic E coli (EPEC) NOT DETECTED NOT DETECTED Final    Enterotoxigenic E coli (ETEC) NOT DETECTED NOT DETECTED Final   Shiga like toxin producing E coli (STEC) NOT DETECTED NOT DETECTED Final   Shigella/Enteroinvasive E coli (EIEC) NOT DETECTED NOT DETECTED Final   Cryptosporidium NOT DETECTED NOT DETECTED Final   Cyclospora cayetanensis NOT DETECTED NOT DETECTED Final   Entamoeba histolytica NOT DETECTED NOT DETECTED Final   Giardia lamblia NOT DETECTED NOT DETECTED Final   Adenovirus F40/41 NOT DETECTED NOT DETECTED Final   Astrovirus NOT DETECTED NOT DETECTED Final   Norovirus GI/GII NOT DETECTED NOT DETECTED Final   Rotavirus A NOT DETECTED NOT DETECTED Final   Sapovirus (I, II, IV, and V) NOT DETECTED NOT DETECTED Final    Comment: Performed at [facility], [address] [place], [place] [address]  C Difficile Quick Screen w PCR reflex     Status: Abnormal   Collection Time: 05/17/24  4:34 PM   Specimen: STOOL  Result Value Ref Range Status   C Diff antigen POSITIVE (A) NEGATIVE Final   C Diff toxin NEGATIVE NEGATIVE Final   C Diff interpretation Results are indeterminate. See PCR results.  Final    Comment: Performed at [facility], [address] [place], [place] [address]  C. Diff by PCR, Reflexed     Status: None   Collection Time: 05/17/24  4:34 PM  Result Value Ref Range Status   Toxigenic C. Difficile by PCR NEGATIVE NEGATIVE Final    Comment: Patient is colonized with non toxigenic C. difficile. May not need treatment unless significant symptoms are present.   Hypervirulent Strain PRESUMPTIVE NEGATIVE PRESUMPTIVE NEGATIVE Final    Comment: Performed at [facility], [address] [place], [place] [address]    Coagulation Studies: Recent Labs    05/16/24 1658  LABPROT 17.8*  INR 1.4*    Urinalysis: Recent Labs    05/15/24 2309  COLORURINE YELLOW*  LABSPEC 1.015  PHURINE 5.0  GLUCOSEU NEGATIVE  HGBUR NEGATIVE  BILIRUBINUR NEGATIVE  KETONESUR NEGATIVE  PROTEINUR NEGATIVE   NITRITE NEGATIVE  LEUKOCYTESUR NEGATIVE      Imaging: US   LIVER DOPPLER Result Date: 05/18/2024 CLINICAL DATA:  Cirrhosis and ascites. Status post large volume paracentesis on 05/16/2024. EXAM: DUPLEX ULTRASOUND OF LIVER TECHNIQUE: Color and duplex Doppler ultrasound was performed to evaluate the hepatic in-flow and out-flow vessels. COMPARISON:  CT abdomen 05/15/2024 FINDINGS: Liver: The liver demonstrates cirrhotic morphology with surface nodularity and right lobe atrophy. No focal lesion, mass or intrahepatic biliary ductal dilatation. Main Portal Vein size: 1.6 cm Portal Vein Velocities Main Prox:  23 cm/sec Main Mid: 24 cm/sec Main Dist:  16 cm/sec Right: Not able to be sampled. Left: 13 cm/sec Hepatic Vein Velocities Right:  21 cm/sec Middle:  26 cm/sec Left:  19 cm/sec IVC: Present and patent with normal respiratory phasicity. Hepatic Artery Velocity:  77 cm/sec Splenic Vein Velocity:  10 cm/sec Spleen: 15.7 cm x 14.3 cm x 5.2 cm with a total volume of 614 cm^3 (411 cm^3 is upper limit normal) Portal Vein Occlusion/Thrombus: Yes Splenic Vein Occlusion/Thrombus: No Ascites: Present Varices: None Thrombus identified in the main portal vein that likely extends into the intrahepatic right portal vein. The thrombus appears to be nonocclusive with some flow present in the main portal vein and intrahepatic left portal vein but the thrombus may be on the verge of becoming completely occlusive. No evidence of hepatic veno-occlusive disease. The splenic vein is patent. Small amount of ascites present in the right upper quadrant. There is moderate splenomegaly. IMPRESSION: 1. Cirrhosis of the liver with evidence of portal hypertension including splenomegaly and ascites. 2. 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 The thrombus may be on the verge of becoming completely occlusive. Electronically Signed   By: Marcey Moan M.D.   On: 05/18/2024  10:07   US  Paracentesis Result Date: 05/16/2024 INDICATION: Patient admitted with history of ETOH cirrhosis, CHF who is currently admitted to the ICU for shock/sepsis. Noted to have ascites on recent imaging. Request for diagnostic and therapeutic paracentesis. EXAM: ULTRASOUND GUIDED DIAGNOSTIC AND THERAPEUTIC PARACENTESIS MEDICATIONS: 5 mL 1% lidocaine  COMPLICATIONS: None immediate. PROCEDURE: Informed written consent was obtained from the patient after a discussion of the risks, benefits and alternatives to treatment. A timeout was performed prior to the initiation of the procedure. Initial ultrasound scanning demonstrates a large amount of ascites within the left lower abdominal quadrant. The left lower abdomen was prepped and draped in the usual sterile fashion. 1% lidocaine  was used for local anesthesia. Following this, a 5 Fr, 7-cm Merit OneStep Centesis catheter was introduced. An ultrasound image was saved for documentation purposes. The paracentesis was performed. The catheter was removed and a dressing was applied. The patient tolerated the procedure well without immediate post procedural complication. FINDINGS: A total of approximately 4.8 L of clear yellow fluid was removed. Samples were sent to the laboratory as requested by the clinical team. IMPRESSION: Successful ultrasound-guided paracentesis yielding 4.8 liters of peritoneal fluid. Performed by Clotilda Hesselbach, PA-C Electronically Signed   By: Juliene Balder M.D.   On: 05/16/2024 16:36   ECHOCARDIOGRAM COMPLETE Result Date: 05/16/2024    ECHOCARDIOGRAM REPORT   Patient Name:   Kevin Gross Date of Exam: 05/16/2024 Medical Rec #:  [id number]       Height:       69.0 in Accession #:    [id number]      Weight:       254.9 lb Date of Birth:  11-Jun-1956       BSA:  2.290 m Patient Age:    68 years        BP:           103/39 mmHg Patient Gender: M               HR:           57 bpm. Exam Location:  [facility] Procedure: 2D Echo, Cardiac Doppler  and Color Doppler (Both Spectral and Color            Flow Doppler were utilized during procedure). Indications:     Congestive heart failure I50.9  History:         Patient has prior history of Echocardiogram examinations, most                  recent 03/09/2024. CHF; Risk Factors:Hypertension and Sleep                  Apnea.  Sonographer:     Christopher Furnace Referring Phys:  [id number] KHABIB DGAYLI Diagnosing Phys: Evalene Lunger MD IMPRESSIONS  1. Left ventricular ejection fraction, by estimation, is 60 to 65%. The left ventricle has normal function. The left ventricle has no regional wall motion abnormalities. The left ventricular internal cavity size was mildly dilated. There is mild left ventricular hypertrophy. Left ventricular diastolic parameters were normal.  2. Right ventricular systolic function is normal. The right ventricular size is normal. There is normal pulmonary artery systolic pressure. The estimated right ventricular systolic pressure is 26.9 mmHg.  3. Left atrial size was mildly dilated.  4. The mitral valve is normal in structure. No evidence of mitral valve regurgitation. No evidence of mitral stenosis.  5. The aortic valve is normal in structure. Aortic valve regurgitation is not visualized. No aortic stenosis is present.  6. The inferior vena cava is normal in size with greater than 50% respiratory variability, suggesting right atrial pressure of 3 mmHg. FINDINGS  Left Ventricle: Left ventricular ejection fraction, by estimation, is 60 to 65%. The left ventricle has normal function. The left ventricle has no regional wall motion abnormalities. Strain was performed and the global longitudinal strain is indeterminate. The left ventricular internal cavity size was mildly dilated. There is mild left ventricular hypertrophy. Left ventricular diastolic parameters were normal. Right Ventricle: The right ventricular size is normal. No increase in right ventricular wall thickness. Right ventricular  systolic function is normal. There is normal pulmonary artery systolic pressure. The tricuspid regurgitant velocity is 2.34 m/s, and  with an assumed right atrial pressure of 5 mmHg, the estimated right ventricular systolic pressure is 26.9 mmHg. Left Atrium: Left atrial size was mildly dilated. Right Atrium: Right atrial size was normal in size. Pericardium: There is no evidence of pericardial effusion. Mitral Valve: The mitral valve is normal in structure. No evidence of mitral valve regurgitation. No evidence of mitral valve stenosis. MV peak gradient, 4.8 mmHg. The mean mitral valve gradient is 2.0 mmHg. Tricuspid Valve: The tricuspid valve is normal in structure. Tricuspid valve regurgitation is mild . No evidence of tricuspid stenosis. Aortic Valve: The aortic valve is normal in structure. Aortic valve regurgitation is not visualized. No aortic stenosis is present. Aortic valve mean gradient measures 7.0 mmHg. Aortic valve peak gradient measures 12.0 mmHg. Aortic valve area, by VTI measures 2.21 cm. Pulmonic Valve: The pulmonic valve was normal in structure. Pulmonic valve regurgitation is not visualized. No evidence of pulmonic stenosis. Aorta: The aortic root is normal in size and structure. Venous: The  inferior vena cava is normal in size with greater than 50% respiratory variability, suggesting right atrial pressure of 3 mmHg. IAS/Shunts: No atrial level shunt detected by color flow Doppler. Additional Comments: 3D was performed not requiring image post processing on an independent workstation and was indeterminate.  LEFT VENTRICLE PLAX 2D LVIDd:         5.40 cm   Diastology LVIDs:         3.10 cm   LV e' medial:    11.30 cm/s LV PW:         1.10 cm   LV E/e' medial:  9.2 LV IVS:        1.40 cm   LV e' lateral:   18.40 cm/s LVOT diam:     2.00 cm   LV E/e' lateral: 5.7 LV SV:         87 LV SV Index:   38 LVOT Area:     3.14 cm  RIGHT VENTRICLE RV Basal diam:  3.80 cm RV Mid diam:    4.20 cm RV S prime:      14.30 cm/s TAPSE (M-mode): 2.4 cm LEFT ATRIUM           Index        RIGHT ATRIUM           Index LA diam:      3.60 cm 1.57 cm/m   RA Area:     19.30 cm LA Vol (A2C): 48.4 ml 21.13 ml/m  RA Volume:   49.60 ml  21.66 ml/m LA Vol (A4C): 75.8 ml 33.10 ml/m  AORTIC VALVE AV Area (Vmax):    2.25 cm AV Area (Vmean):   2.10 cm AV Area (VTI):     2.21 cm AV Vmax:           173.00 cm/s AV Vmean:          119.500 cm/s AV VTI:            0.394 m AV Peak Grad:      12.0 mmHg AV Mean Grad:      7.0 mmHg LVOT Vmax:         124.00 cm/s LVOT Vmean:        79.900 cm/s LVOT VTI:          0.278 m LVOT/AV VTI ratio: 0.70  AORTA Ao Root diam: 3.50 cm MITRAL VALVE                TRICUSPID VALVE MV Area (PHT): 3.70 cm     TR Peak grad:   21.9 mmHg MV Area VTI:   2.07 cm     TR Vmax:        234.00 cm/s MV Peak grad:  4.8 mmHg MV Mean grad:  2.0 mmHg     SHUNTS MV Vmax:       1.10 m/s     Systemic VTI:  0.28 m MV Vmean:      62.8 cm/s    Systemic Diam: 2.00 cm MV Decel Time: 205 msec MV E velocity: 104.00 cm/s MV A velocity: 76.50 cm/s MV E/A ratio:  1.36 Evalene Lunger MD Electronically signed by Evalene Lunger MD Signature Date/Time: 05/16/2024/2:54:52 PM    Final      Medications:    albumin  human     azithromycin  Stopped (05/17/24 1302)   cefTRIAXone  (ROCEPHIN )  IV Stopped (05/17/24 2138)   norepinephrine  (LEVOPHED ) Adult infusion Stopped (05/18/24 0549)   phytonadione  (VITAMIN K ) 10 mg  in dextrose  5 % 50 mL IVPB Stopped (05/17/24 0955)   vasopressin  Stopped (05/17/24 1431)    Chlorhexidine  Gluconate Cloth  6 each Topical Q0600   feeding supplement  237 mL Oral TID BM   heparin   5,000 Units Subcutaneous Q8H   midodrine   10 mg Oral TID WC   multivitamin  1 tablet Oral QHS   multivitamin with minerals  1 tablet Oral Daily   pantoprazole   40 mg Oral Daily   thiamine   100 mg Oral Daily   vancomycin   125 mg Oral QID   alteplase , docusate sodium , heparin , ipratropium-albuterol , mouth rinse, polyethylene  glycol  Assessment/ Plan:  [name] is a 68 y.o.  male  with past medical conditions including hypertension, COPD, obstructive sleep apnea with CPAP, anemia, and chronic kidney disease stage IV, who was admitted to [facility] on 05/15/2024 for Shock circulatory (HCC) [R57.9] Generalized weakness [R53.1] Hypotension, unspecified hypotension type [I95.9] Acute renal failure, unspecified acute renal failure type (HCC) [N17.9]   Acute kidney injury on chronic kidney disease stage IV. Baseline creatinine 2.77 with GFR 24 06/30/2024. Acute kidney injury likely secondary to hypotension and infectious process. Renal ultrasound negative for obstruction. No recent IV contrast exposure.  Patient has received 2 dialysis treatments.  Creatinine down to 5.7 however urine output only 600 cc.  We will plan for his third dialysis treatment today.  Lab Results  Component Value Date   CREATININE 5.73 (H) 05/18/2024   CREATININE 7.87 (H) 05/17/2024   CREATININE 8.16 (H) 05/16/2024    Intake/Output Summary (Last 24 hours) at 05/18/2024 1012 Last data filed at 05/18/2024 0800 Gross per 24 hour  Intake 819.97 ml  Output 900 ml  Net -80.03 ml   2.  Acute metabolic acidosis, serum bicarb 14 on admission.  Serum bicarbonate now improved to 22.  Has corrected with dialysis.  3. Anemia of chronic kidney disease Lab Results  Component Value Date   HGB 9.1 (L) 05/18/2024  Hemoglobin still a bit low at 9.1.  Hold off on Epogen  for now.  4.  Hypotension likely sepsis related, source unknown at this time.  Patient off pressors at the moment.  5. Secondary Hyperparathyroidism: with outpatient labs: PTH 52.7, phosphorus 4.1, calcium  8.6 on 7/2.  Lab Results  Component Value Date   CALCIUM  8.0 (L) 05/18/2024   CAION 1.21 08/08/2023   PHOS 4.5 05/18/2024    Phosphorus now acceptable at 4.5.  Continue to monitor bone metabolism parameters.   LOS: 3 Eyana Stolze 9/20/202510:12 AM

## 2024-05-18 NOTE — Plan of Care (Signed)
  Problem: Education: Goal: Knowledge of General Education information will improve Description: Including pain rating scale, medication(s)/side effects and non-pharmacologic comfort measures Outcome: Progressing   Problem: Clinical Measurements: Goal: Ability to maintain clinical measurements within normal limits will improve Outcome: Progressing Goal: Diagnostic test results will improve Outcome: Progressing Goal: Respiratory complications will improve Outcome: Progressing Goal: Cardiovascular complication will be avoided Outcome: Progressing   Problem: Activity: Goal: Risk for activity intolerance will decrease Outcome: Progressing   Problem: Nutrition: Goal: Adequate nutrition will be maintained Outcome: Progressing   Problem: Coping: Goal: Level of anxiety will decrease Outcome: Progressing   Problem: Elimination: Goal: Will not experience complications related to bowel motility Outcome: Progressing Goal: Will not experience complications related to urinary retention Outcome: Progressing   Problem: Pain Managment: Goal: General experience of comfort will improve and/or be controlled Outcome: Progressing   Problem: Safety: Goal: Ability to remain free from injury will improve Outcome: Progressing   Problem: Clinical Measurements: Goal: Will remain free from infection Outcome: Not Progressing

## 2024-05-18 NOTE — Consult Note (Signed)
 Inpatient Consultation   Patient ID: Kevin Gross is a 68 y.o. male.  Requesting Provider: Belva November, MD   Date of Admission: 05/15/2024  Date of Consult: 05/18/24   Reason for Consultation: Cirrhosis Management   Patient's Chief Complaint:   Chief Complaint  Patient presents with   Shortness of Breath    68 year old Caucasian male with cirrhosis secondary to alcohol abuse, CHF, CKD, COPD, hyperlipidemia, hypertension, OSA who presents to the hospital with worsening shortness of breath and progressive weakness.  Prior to hospitalization he had worsening weakness and dizziness with decreased UOP.  Also had decreasing appetite.  He did over the last several days developing nausea vomiting and diarrhea after eating a salad.  No melena or hematochezia.  He did have increasing abdominal distention for which she went a paracentesis during this hospital course which was negative for SBP.  Underwent imaging which demonstrated nearly occlusive portal vein thrombosis acute.  He was dealing with hemodynamic instability specifically hypotension requiring vasopressor support hence his admission to the ICU.  Denies fever or chills.  Unfortunately his renal function has worsened requiring him to undergo hemodialysis.  This is his third day in a row today.  Unclear if he responded from albumin  challenge 100 g/day for 2 days as he was already on dialysis  Continues to remain abstinent from alcohol.  He is resting comfortably in bed during dialysis  Scheduled for outpatient colonoscopy on 05/27/2024  Denies NSAIDs, Anti-plt agents, and anticoagulants Denies family history of gastrointestinal disease and malignancy Previous Endoscopies: Most recent push enteroscopy and colonoscopy January 2025 with gastritis negative for H. pylori but positive for gastric intestinal metaplasia.  Duodenal ulcer and AVMs were noted. Diverticulosis and hemorrhoids also noted   Past Medical History:   Diagnosis Date   Alcoholic cirrhosis of liver (HCC)    Anemia 2024   Cataract 2024   CHF (congestive heart failure) (HCC)    Chronic gouty arthritis    CKD (chronic kidney disease), stage III (HCC)    Clotting disorder (HCC)    Bilateral legs-pt states plaque build up in both legs requiring intervention   Complication of anesthesia    pt states that years ago during colonoscopy at [facility] they had to abort the procedure due to some cardiac issue had to see cardiologist   COPD (chronic obstructive pulmonary disease) (HCC)    Hyperlipidemia    Hypertension    Hyperuricemia    Peripheral arterial disease (HCC)    Pre-diabetes    Prostate cancer (HCC) 2023   Sleep apnea    uses cpap    Past Surgical History:  Procedure Laterality Date   ABDOMINAL AORTOGRAM W/LOWER EXTREMITY N/A 10/20/2021   Procedure: ABDOMINAL AORTOGRAM W/LOWER EXTREMITY;  Surgeon: Darron Deatrice LABOR, MD;  Location: [facility];  Service: Cardiovascular;  Laterality: N/A;   ABDOMINAL AORTOGRAM W/LOWER EXTREMITY N/A 08/08/2023   Procedure: ABDOMINAL AORTOGRAM W/LOWER EXTREMITY;  Surgeon: Serene Gaile ORN, MD;  Location: [facility];  Service: Cardiovascular;  Laterality: N/A;   ALLOGRAFT APPLICATION Bilateral 12/22/2021   Procedure: ALLOGRAFT APPLICATION;  Surgeon: Serene Gaile ORN, MD;  Location: [facility] OR;  Service: Vascular;  Laterality: Bilateral;   ANGIOPLASTY Left 11/26/2021   Procedure: LEFT INFRARENAL LITHOTRIPSY;  Surgeon: Serene Gaile ORN, MD;  Location: [facility] OR;  Service: Vascular;  Laterality: Left;   APPLICATION OF WOUND VAC Bilateral 12/13/2021   Procedure: APPLICATION OF WOUND VAC;  Surgeon: Lanis Fonda BRAVO, MD;  Location: [facility] OR;  Service: Vascular;  Laterality: Bilateral;   CARDIAC CATHETERIZATION     [facility]   CATARACT EXTRACTION W/PHACO Left 04/04/2023   Procedure: CATARACT EXTRACTION PHACO AND INTRAOCULAR LENS PLACEMENT (IOC) LEFT 8.71 00:45.5;  Surgeon: Jaye Fallow, MD;  Location:  [facility];  Service: Ophthalmology;  Laterality: Left;   CATARACT EXTRACTION W/PHACO Right 04/18/2023   Procedure: CATARACT EXTRACTION PHACO AND INTRAOCULAR LENS PLACEMENT (IOC) RIGHT 4.31 00:8.2;  Surgeon: Jaye Fallow, MD;  Location: [facility];  Service: Ophthalmology;  Laterality: Right;   COLONOSCOPY     COLONOSCOPY WITH PROPOFOL  N/A 01/17/2018   Procedure: COLONOSCOPY WITH PROPOFOL ;  Surgeon: Toledo, Ladell POUR, MD;  Location: [facility] END[facility]COPY;  Service: Gastroenterology;  Laterality: N/A;   COLONOSCOPY WITH PROPOFOL  N/A 09/12/2022   Procedure: COLONOSCOPY WITH PROPOFOL ;  Surgeon: Maryruth Ole DASEN, MD;  Location: [facility] ENDOSCOPY;  Service: Endoscopy;  Laterality: N/A;   COLONOSCOPY WITH PROPOFOL  N/A 09/05/2023   Procedure: COLONOSCOPY WITH PROPOFOL ;  Surgeon: Maryruth Ole DASEN, MD;  Location: [facility] END[facility]COPY;  Service: Endoscopy;  Laterality: N/A;   ENDARTERECTOMY FEMORAL Bilateral 11/26/2021   Procedure: BILATERAL FEMORAL ENDARTERECTOMY WITH VEIN PATCH ANGIOPLASTY;  Surgeon: Serene Gaile ORN, MD;  Location: [facility] OR;  Service: Vascular;  Laterality: Bilateral;   ENTEROSCOPY N/A 09/05/2023   Procedure: ENTEROSCOPY;  Surgeon: Maryruth Ole DASEN, MD;  Location: [facility];  Service: Endoscopy;  Laterality: N/A;   ESOPHAGOGASTRODUODENOSCOPY (EGD) WITH PROPOFOL  N/A 01/17/2018   Procedure: ESOPHAGOGASTRODUODENOSCOPY (EGD) WITH PROPOFOL ;  Surgeon: Toledo, Ladell POUR, MD;  Location: [facility];  Service: Gastroenterology;  Laterality: N/A;   ESOPHAGOGASTRODUODENOSCOPY (EGD) WITH PROPOFOL  N/A 09/12/2022   Procedure: ESOPHAGOGASTRODUODENOSCOPY (EGD) WITH PROPOFOL ;  Surgeon: Maryruth Ole DASEN, MD;  Location: [facility];  Service: Endoscopy;  Laterality: N/A;   EYE SURGERY     GROIN DEBRIDEMENT Bilateral 12/16/2021   Procedure: IRRIGATION AND DEBRIDEMENT OF BILATERAL GROINS;  Surgeon: Serene Gaile ORN, MD;  Location: [facility];  Service: Vascular;  Laterality: Bilateral;    INCISION AND DRAINAGE OF WOUND Bilateral 12/13/2021   Procedure: IRRIGATION AND DEBRIDEMENT OF BILATERAL GROIN WOUNDS;  Surgeon: Lanis Fonda BRAVO, MD;  Location: [facility] OR;  Service: Vascular;  Laterality: Bilateral;   INSERTION OF ILIAC STENT Bilateral 11/26/2021   Procedure: BILATERAL ILIAC STENTING USING 42mmX80mm INNOVA STENT ON LEFT ILIAC AND 66mmX59mm, 82mmX79mm VBX AND 35mmX7.5cm VIABAHN STENT ON RIGHT ILIAC;  Surgeon: Serene Gaile ORN, MD;  Location: [facility];  Service: Vascular;  Laterality: Bilateral;   INSERTION OF SEEDS IN PROSTATE     ORIF ANKLE FRACTURE Right    PERIPHERAL INTRAVASCULAR LITHOTRIPSY  08/08/2023   Procedure: PERIPHERAL INTRAVASCULAR LITHOTRIPSY;  Surgeon: Serene Gaile ORN, MD;  Location: [facility];  Service: Cardiovascular;;   PROSTATE BIOPSY N/A 04/01/2021   Procedure: PROSTATE BIOPSY GRAYCE;  Surgeon: Kassie Ozell SAUNDERS, MD;  Location: [facility] ORS;  Service: Urology;  Laterality: N/A;   VEIN HARVEST Bilateral 11/26/2021   Procedure: VEIN HARVEST OF BILATERAL SPAHENOUS VEINS;  Surgeon: Serene Gaile ORN, MD;  Location: [facility];  Service: Vascular;  Laterality: Bilateral;   WOUND DEBRIDEMENT Bilateral 12/22/2021   Procedure: INCISION AND DEBRIDEMENT OF BILATERAL GROINS;  Surgeon: Serene Gaile ORN, MD;  Location: [facility];  Service: Vascular;  Laterality: Bilateral;    Allergies  Allergen Reactions   Jardiance [Empagliflozin]     Loss of balance, hypoglycemic     Family History  Problem Relation Age of Onset   Heart disease Mother    Heart disease Brother  Social History   Tobacco Use   Smoking status: Former    Current packs/day: 0.00    Average packs/day: 1 pack/day for 47.0 years (47.0 ttl pk-yrs)    Types: Cigarettes    Start date: 75    Quit date: 2016    Years since quitting: 9.7    Passive exposure: Never   Smokeless tobacco: Never  Vaping Use   Vaping status: Never Used  Substance Use Topics   Alcohol use: Not Currently   Drug use: No      Pertinent GI related history and allergies were reviewed with the patient  Review of Systems  Constitutional:  Positive for appetite change and fatigue. Negative for activity change, chills, diaphoresis, fever and unexpected weight change.  HENT:  Negative for trouble swallowing and voice change.   Respiratory:  Negative for shortness of breath and wheezing.   Cardiovascular:  Negative for chest pain, palpitations and leg swelling.  Gastrointestinal:  Positive for abdominal distention and diarrhea. Negative for abdominal pain, anal bleeding, blood in stool, constipation, nausea and vomiting.  Musculoskeletal:  Negative for arthralgias and myalgias.  Skin:  Negative for color change and pallor.  Neurological:  Positive for weakness and light-headedness. Negative for dizziness and syncope.  Psychiatric/Behavioral:  Negative for confusion. The patient is not nervous/anxious.   All other systems reviewed and are negative.    Medications Home Medications No current facility-administered medications on file prior to encounter.   Current Outpatient Medications on File Prior to Encounter  Medication Sig Dispense Refill   allopurinol  (ZYLOPRIM ) 100 MG tablet Take 150 mg by mouth daily.     amiodarone  (PACERONE ) 200 MG tablet Take 200 mg by mouth 2 (two) times daily.     amoxicillin-clavulanate (AUGMENTIN) 500-125 MG tablet Take 1 tablet by mouth 2 (two) times daily.     aspirin  EC 81 MG tablet Take 81 mg by mouth every morning.     chlorthalidone  (HYGROTON ) 25 MG tablet Take 25 mg by mouth daily.     Cholecalciferol  (VITAMIN D3) 25 MCG (1000 UT) CAPS Take 2,000 Units by mouth daily.     clopidogrel  (PLAVIX ) 75 MG tablet Take 1 tablet (75 mg total) by mouth daily. 30 tablet 11   ezetimibe  (ZETIA ) 10 MG tablet Take 1 tablet (10 mg total) by mouth daily. 90 tablet 3   iron  polysaccharides (NIFEREX) 150 MG capsule Take 1 capsule (150 mg total) by mouth 2 (two) times daily. 180 capsule 0    lisinopril  (ZESTRIL ) 30 MG tablet Take 30 mg by mouth daily.     simvastatin  (ZOCOR ) 20 MG tablet Take 20 mg by mouth in the morning.     triamcinolone  ointment (KENALOG ) 0.1 % Apply 1 Application topically 2 (two) times daily as needed (psoriasis).     zolpidem  (AMBIEN ) 10 MG tablet Take 10 mg by mouth at bedtime.     pantoprazole  (PROTONIX ) 40 MG tablet Take 40 mg by mouth daily.     Pertinent GI related medications were reviewed with the patient  Inpatient Medications  Current Facility-Administered Medications:    alteplase  (CATHFLO ACTIVASE ) injection 2 mg, 2 mg, Intracatheter, Once PRN, Levorn Ramonita SQUIBB, NP   azithromycin  (ZITHROMAX ) 500 mg in sodium chloride  0.9 % 250 mL IVPB, 500 mg, Intravenous, Q24H, Dgayli, Khabib, MD, Last Rate: 250 mL/hr at 05/18/24 1206, 500 mg at 05/18/24 1206   cefTRIAXone  (ROCEPHIN ) 2 g in sodium chloride  0.9 % 100 mL IVPB, 2 g, Intravenous, Q24H, Rust-Chester, Jenita CROME, NP,  Stopped at 05/17/24 2138   Chlorhexidine  Gluconate Cloth 2 % PADS 6 each, 6 each, Topical, Q0600, Levorn Ramonita SQUIBB, NP   docusate sodium  (COLACE) capsule 100 mg, 100 mg, Oral, BID PRN, Rust-Chester, Jenita CROME, NP   feeding supplement (ENSURE PLUS HIGH PROTEIN) liquid 237 mL, 237 mL, Oral, TID BM, Dgayli, Khabib, MD, 237 mL at 05/18/24 0845   heparin  ADULT infusion 100 units/mL (25000 units/250mL), 1,500 Units/hr, Intravenous, Continuous, Tobie Cathaleen RAMAN, RPH, Last Rate: 15 mL/hr at 05/18/24 1200, 1,500 Units/hr at 05/18/24 1200   heparin  injection 1,000 Units, 1,000 Units, Intracatheter, PRN, Levorn, [place] P, NP   ipratropium-albuterol  (DUONEB) 0.5-2.5 (3) MG/3ML nebulizer solution 3 mL, 3 mL, Nebulization, Q4H PRN, Rust-Chester, Britton L, NP   midodrine  (PROAMATINE ) tablet 10 mg, 10 mg, Oral, TID WC, Keene, Jeremiah D, NP, 10 mg at 05/18/24 1208   multivitamin (RENA-VIT) tablet 1 tablet, 1 tablet, Oral, QHS, Dgayli, Khabib, MD, 1 tablet at 05/17/24 2100   multivitamin with minerals  tablet 1 tablet, 1 tablet, Oral, Daily, Dgayli, Khabib, MD, 1 tablet at 05/18/24 0845   norepinephrine  (LEVOPHED ) 16 mg in 250mL (0.064 mg/mL) premix infusion, 0-40 mcg/min, Intravenous, Titrated, Nelson, Dana G, NP, Stopped at 05/18/24 0549   Oral care mouth rinse, 15 mL, Mouth Rinse, PRN, Isadora Hose, MD   pantoprazole  (PROTONIX ) EC tablet 40 mg, 40 mg, Oral, Daily, Rust-Chester, Britton L, NP, 40 mg at 05/18/24 0845   polyethylene glycol (MIRALAX  / GLYCOLAX ) packet 17 g, 17 g, Oral, Daily PRN, Rust-Chester, Jenita L, NP   thiamine  (VITAMIN B1) tablet 100 mg, 100 mg, Oral, Daily, Dgayli, Khabib, MD, 100 mg at 05/18/24 0844   vancomycin  (VANCOCIN ) capsule 125 mg, 125 mg, Oral, QID, Keene, Jeremiah D, NP, 125 mg at 05/18/24 0845  azithromycin  500 mg (05/18/24 1206)   cefTRIAXone  (ROCEPHIN )  IV Stopped (05/17/24 2138)   heparin  1,500 Units/hr (05/18/24 1200)   norepinephrine  (LEVOPHED ) Adult infusion Stopped (05/18/24 0549)    alteplase , docusate sodium , heparin , ipratropium-albuterol , mouth rinse, polyethylene glycol   Objective   Vitals:   05/18/24 1030 05/18/24 1100 05/18/24 1130 05/18/24 1200  BP: (!) 118/59 (!) 120/57 (!) 117/59 (!) 117/56  Pulse: (!) 59 60 61 60  Resp: 15 20 17 17   Temp:    98.6 F (37 C)  TempSrc:    Oral  SpO2: 96% 97% 95% 96%  Weight:      Height:         Physical Exam Vitals and nursing note reviewed.  Constitutional:      General: He is not in acute distress.    Appearance: He is obese. He is ill-appearing. He is not toxic-appearing or diaphoretic.  HENT:     Head: Normocephalic and atraumatic.     Nose: Nose normal.     Mouth/Throat:     Mouth: Mucous membranes are moist.     Pharynx: Oropharynx is clear.  Eyes:     General: No scleral icterus.    Extraocular Movements: Extraocular movements intact.  Cardiovascular:     Rate and Rhythm: Regular rhythm. Bradycardia present.     Heart sounds: Normal heart sounds. No murmur heard.    No  friction rub. No gallop.  Pulmonary:     Effort: Pulmonary effort is normal. No respiratory distress.     Breath sounds: Normal breath sounds. No wheezing, rhonchi or rales.  Abdominal:     General: Bowel sounds are normal. There is no distension.     Palpations:  Abdomen is soft.     Tenderness: There is no abdominal tenderness. There is no guarding or rebound.  Musculoskeletal:     Cervical back: Neck supple.     Right lower leg: Edema present.     Left lower leg: Edema present.  Skin:    General: Skin is warm and dry.     Coloration: Skin is not jaundiced or pale.  Neurological:     General: No focal deficit present.     Mental Status: He is alert and oriented to person, place, and time. Mental status is at baseline.  Psychiatric:        Mood and Affect: Mood normal.        Behavior: Behavior normal.        Thought Content: Thought content normal.        Judgment: Judgment normal.     Laboratory Data Recent Labs  Lab 05/16/24 0441 05/17/24 0544 05/18/24 0447  WBC 18.2* 10.9* 9.4  HGB 10.8* 9.1* 9.1*  HCT 33.8* 27.2* 27.8*  PLT 211 123* 108*   Recent Labs  Lab 05/15/24 1528 05/15/24 2148 05/16/24 0441 05/16/24 1128 05/16/24 2056 05/17/24 0544 05/18/24 0447  NA 142   < > 143   < > 140 142 140  K 5.0   < > 4.8   < > 4.3 4.1 3.8  CL 109   < > 117*   < > 107 106 101  CO2 14*   < > 16*   < > 19* 20* 22  BUN 117*   < > 115*   < > 86* 91* 64*  CALCIUM  8.2*   < > 7.6*   < > 7.6* 7.8* 8.0*  PROT 6.1*  --  5.8*  --   --  5.8*  --   BILITOT 1.2  --  1.3*  --   --  1.7*  --   ALKPHOS 187*  --  183*  --   --  128*  --   ALT 49*  --  47*  --   --  41  --   AST 98*  --  95*  --   --  95*  --   GLUCOSE 102*   < > 104*   < > 120* 121* 112*   < > = values in this interval not displayed.   Recent Labs  Lab 05/16/24 1658 05/18/24 1056  INR 1.4* 1.5*    No results for input(s): LIPASE in the last 72 hours.      Imaging Studies: US  LIVER DOPPLER Result Date:  05/18/2024 CLINICAL DATA:  Cirrhosis and ascites. Status post large volume paracentesis on 05/16/2024. EXAM: DUPLEX ULTRASOUND OF LIVER TECHNIQUE: Color and duplex Doppler ultrasound was performed to evaluate the hepatic in-flow and out-flow vessels. COMPARISON:  CT abdomen 05/15/2024 FINDINGS: Liver: The liver demonstrates cirrhotic morphology with surface nodularity and right lobe atrophy. No focal lesion, mass or intrahepatic biliary ductal dilatation. Main Portal Vein size: 1.6 cm Portal Vein Velocities Main Prox:  23 cm/sec Main Mid: 24 cm/sec Main Dist:  16 cm/sec Right: Not able to be sampled. Left: 13 cm/sec Hepatic Vein Velocities Right:  21 cm/sec Middle:  26 cm/sec Left:  19 cm/sec IVC: Present and patent with normal respiratory phasicity. Hepatic Artery Velocity:  77 cm/sec Splenic Vein Velocity:  10 cm/sec Spleen: 15.7 cm x 14.3 cm x 5.2 cm with a total volume of 614 cm^3 (411 cm^3 is upper limit normal) Portal Vein Occlusion/Thrombus: Yes  Splenic Vein Occlusion/Thrombus: No Ascites: Present Varices: None Thrombus identified in the main portal vein that likely extends into the intrahepatic right portal vein. The thrombus appears to be nonocclusive with some flow present in the main portal vein and intrahepatic left portal vein but the thrombus may be on the verge of becoming completely occlusive. No evidence of hepatic veno-occlusive disease. The splenic vein is patent. Small amount of ascites present in the right upper quadrant. There is moderate splenomegaly. IMPRESSION: 1. Cirrhosis of the liver with evidence of portal hypertension including splenomegaly and ascites. 2. 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 The thrombus may be on the verge of becoming completely occlusive. Electronically Signed   By: Marcey Moan M.D.   On: 05/18/2024 10:07   US  Paracentesis Result Date: 05/16/2024 INDICATION: Patient admitted with history of  ETOH cirrhosis, CHF who is currently admitted to the ICU for shock/sepsis. Noted to have ascites on recent imaging. Request for diagnostic and therapeutic paracentesis. EXAM: ULTRASOUND GUIDED DIAGNOSTIC AND THERAPEUTIC PARACENTESIS MEDICATIONS: 5 mL 1% lidocaine  COMPLICATIONS: None immediate. PROCEDURE: Informed written consent was obtained from the patient after a discussion of the risks, benefits and alternatives to treatment. A timeout was performed prior to the initiation of the procedure. Initial ultrasound scanning demonstrates a large amount of ascites within the left lower abdominal quadrant. The left lower abdomen was prepped and draped in the usual sterile fashion. 1% lidocaine  was used for local anesthesia. Following this, a 5 Fr, 7-cm Merit OneStep Centesis catheter was introduced. An ultrasound image was saved for documentation purposes. The paracentesis was performed. The catheter was removed and a dressing was applied. The patient tolerated the procedure well without immediate post procedural complication. FINDINGS: A total of approximately 4.8 L of clear yellow fluid was removed. Samples were sent to the laboratory as requested by the clinical team. IMPRESSION: Successful ultrasound-guided paracentesis yielding 4.8 liters of peritoneal fluid. Performed by Clotilda Hesselbach, PA-C Electronically Signed   By: Juliene Balder M.D.   On: 05/16/2024 16:36   ECHOCARDIOGRAM COMPLETE Result Date: 05/16/2024    ECHOCARDIOGRAM REPORT   Patient Name:   Kevin Gross Date of Exam: 05/16/2024 Medical Rec #:  [id number]       Height:       69.0 in Accession #:    [id number]      Weight:       254.9 lb Date of Birth:  1956/05/27       BSA:          2.290 m Patient Age:    68 years        BP:           103/39 mmHg Patient Gender: M               HR:           57 bpm. Exam Location:  [facility] Procedure: 2D Echo, Cardiac Doppler and Color Doppler (Both Spectral and Color            Flow Doppler were utilized during  procedure). Indications:     Congestive heart failure I50.9  History:         Patient has prior history of Echocardiogram examinations, most                  recent 03/09/2024. CHF; Risk Factors:Hypertension and Sleep  Apnea.  Sonographer:     Christopher Furnace Referring Phys:  [id number] KHABIB DGAYLI Diagnosing Phys: Evalene Lunger MD IMPRESSIONS  1. Left ventricular ejection fraction, by estimation, is 60 to 65%. The left ventricle has normal function. The left ventricle has no regional wall motion abnormalities. The left ventricular internal cavity size was mildly dilated. There is mild left ventricular hypertrophy. Left ventricular diastolic parameters were normal.  2. Right ventricular systolic function is normal. The right ventricular size is normal. There is normal pulmonary artery systolic pressure. The estimated right ventricular systolic pressure is 26.9 mmHg.  3. Left atrial size was mildly dilated.  4. The mitral valve is normal in structure. No evidence of mitral valve regurgitation. No evidence of mitral stenosis.  5. The aortic valve is normal in structure. Aortic valve regurgitation is not visualized. No aortic stenosis is present.  6. The inferior vena cava is normal in size with greater than 50% respiratory variability, suggesting right atrial pressure of 3 mmHg. FINDINGS  Left Ventricle: Left ventricular ejection fraction, by estimation, is 60 to 65%. The left ventricle has normal function. The left ventricle has no regional wall motion abnormalities. Strain was performed and the global longitudinal strain is indeterminate. The left ventricular internal cavity size was mildly dilated. There is mild left ventricular hypertrophy. Left ventricular diastolic parameters were normal. Right Ventricle: The right ventricular size is normal. No increase in right ventricular wall thickness. Right ventricular systolic function is normal. There is normal pulmonary artery systolic pressure. The tricuspid  regurgitant velocity is 2.34 m/s, and  with an assumed right atrial pressure of 5 mmHg, the estimated right ventricular systolic pressure is 26.9 mmHg. Left Atrium: Left atrial size was mildly dilated. Right Atrium: Right atrial size was normal in size. Pericardium: There is no evidence of pericardial effusion. Mitral Valve: The mitral valve is normal in structure. No evidence of mitral valve regurgitation. No evidence of mitral valve stenosis. MV peak gradient, 4.8 mmHg. The mean mitral valve gradient is 2.0 mmHg. Tricuspid Valve: The tricuspid valve is normal in structure. Tricuspid valve regurgitation is mild . No evidence of tricuspid stenosis. Aortic Valve: The aortic valve is normal in structure. Aortic valve regurgitation is not visualized. No aortic stenosis is present. Aortic valve mean gradient measures 7.0 mmHg. Aortic valve peak gradient measures 12.0 mmHg. Aortic valve area, by VTI measures 2.21 cm. Pulmonic Valve: The pulmonic valve was normal in structure. Pulmonic valve regurgitation is not visualized. No evidence of pulmonic stenosis. Aorta: The aortic root is normal in size and structure. Venous: The inferior vena cava is normal in size with greater than 50% respiratory variability, suggesting right atrial pressure of 3 mmHg. IAS/Shunts: No atrial level shunt detected by color flow Doppler. Additional Comments: 3D was performed not requiring image post processing on an independent workstation and was indeterminate.  LEFT VENTRICLE PLAX 2D LVIDd:         5.40 cm   Diastology LVIDs:         3.10 cm   LV e' medial:    11.30 cm/s LV PW:         1.10 cm   LV E/e' medial:  9.2 LV IVS:        1.40 cm   LV e' lateral:   18.40 cm/s LVOT diam:     2.00 cm   LV E/e' lateral: 5.7 LV SV:         87 LV SV Index:   38 LVOT Area:  3.14 cm  RIGHT VENTRICLE RV Basal diam:  3.80 cm RV Mid diam:    4.20 cm RV S prime:     14.30 cm/s TAPSE (M-mode): 2.4 cm LEFT ATRIUM           Index        RIGHT ATRIUM            Index LA diam:      3.60 cm 1.57 cm/m   RA Area:     19.30 cm LA Vol (A2C): 48.4 ml 21.13 ml/m  RA Volume:   49.60 ml  21.66 ml/m LA Vol (A4C): 75.8 ml 33.10 ml/m  AORTIC VALVE AV Area (Vmax):    2.25 cm AV Area (Vmean):   2.10 cm AV Area (VTI):     2.21 cm AV Vmax:           173.00 cm/s AV Vmean:          119.500 cm/s AV VTI:            0.394 m AV Peak Grad:      12.0 mmHg AV Mean Grad:      7.0 mmHg LVOT Vmax:         124.00 cm/s LVOT Vmean:        79.900 cm/s LVOT VTI:          0.278 m LVOT/AV VTI ratio: 0.70  AORTA Ao Root diam: 3.50 cm MITRAL VALVE                TRICUSPID VALVE MV Area (PHT): 3.70 cm     TR Peak grad:   21.9 mmHg MV Area VTI:   2.07 cm     TR Vmax:        234.00 cm/s MV Peak grad:  4.8 mmHg MV Mean grad:  2.0 mmHg     SHUNTS MV Vmax:       1.10 m/s     Systemic VTI:  0.28 m MV Vmean:      62.8 cm/s    Systemic Diam: 2.00 cm MV Decel Time: 205 msec MV E velocity: 104.00 cm/s MV A velocity: 76.50 cm/s MV E/A ratio:  1.36 Evalene Lunger MD Electronically signed by Evalene Lunger MD Signature Date/Time: 05/16/2024/2:54:52 PM    Final     Assessment:   # Cirrhosis secondary to alcohol abuse - Currently sober - ascites- 4.8 L removed; unclear if ascites developed 2/2 PVT or other decompensation featues - Concern for developing HRS versus other induced kidney injury - However, urine sodium 34 - Unclear if he responded to albumin  challenge as he was already on dialysis at that point  -Patient with portal vein thrombosis present now on anticoagulation which may have contributed to some decompensation -No other appearing inciting factors-SBP negative no signs of GI bleeding # aki on ckd  #Thrombocytopenia # CAP  #A-fib #OSA #Hypertension  Plan:  Has already received albumin  challenge On midodrine  Will add octreotide  gtt Dialysis as per nephrology-he is beginning to make urine again Paracentesis negative for SBP Continue to monitor for further signs of  decompensation Continue anticoagulation for PVT- cross-sectional imaging every 3 months to assess response; if recanalization or partial regression occurs, anticoagulation is continued with ongoing imaging. If there is no response or progression after 6 months, anticoagulation may be discontinued for futility, as further recanalization is unlikely  Pressors as needed per icu team   No other GI interventions planned at this time. GI to sign off. Available  as needed. Please do not hesitate to call regarding questions or concerns. Please call back if needed  Follow-up as outpatient with primary GI  Management of other medical comorbidities as per primary team  I personally performed the service.  Thank you for allowing us  to participate in this patient's care. Please don't hesitate to call if any questions or concerns arise.   Elspeth Ozell Jungling, DO [facility]  Portions of the record may have been created with voice recognition software. Occasional wrong-word or 'sound-a-like' substitutions may have occurred due to the inherent limitations of voice recognition software.  Read the chart carefully and recognize, using context, where substitutions may have occurred.

## 2024-05-18 NOTE — Consult Note (Signed)
 PHARMACY - ANTICOAGULATION CONSULT NOTE  Pharmacy Consult for Heparin   Indication: portal vein thrombosis  Allergies  Allergen Reactions   Jardiance [Empagliflozin]     Loss of balance, hypoglycemic     Patient Measurements: Height: 5' 9 (175.3 cm) Weight: 114.6 kg (252 lb 10.4 oz) IBW/kg (Calculated) : 70.7 HEPARIN  DW (KG): 96.5  Vital Signs: Temp: 98.7 F (37.1 C) (09/20 1634) Temp Source: Oral (09/20 1600) BP: 103/49 (09/20 1700) Pulse Rate: 63 (09/20 1700)  Labs: Recent Labs    05/15/24 1958 05/15/24 2148 05/16/24 0441 05/16/24 1128 05/16/24 1658 05/16/24 2056 05/17/24 0544 05/18/24 0447 05/18/24 1056 05/18/24 1643  HGB  --    < > 10.8*  --   --   --  9.1* 9.1*  --   --   HCT  --   --  33.8*  --   --   --  27.2* 27.8*  --   --   PLT  --   --  211  --   --   --  123* 108*  --   --   APTT  --   --   --   --  43*  --   --   --  50*  --   LABPROT  --   --   --   --  17.8*  --   --   --  18.7*  --   INR  --   --   --   --  1.4*  --   --   --  1.5*  --   HEPARINUNFRC  --   --   --   --   --   --   --   --   --  0.29*  CREATININE  --    < > 10.89*   < >  --  8.16* 7.87* 5.73*  --   --   TROPONINIHS 26*  --   --   --   --   --   --   --   --   --    < > = values in this interval not displayed.    Estimated Creatinine Clearance: 15.8 mL/min (A) (by C-G formula based on SCr of 5.73 mg/dL (H)).   Medical History: Past Medical History:  Diagnosis Date   Alcoholic cirrhosis of liver (HCC)    Anemia 2024   Cataract 2024   CHF (congestive heart failure) (HCC)    Chronic gouty arthritis    CKD (chronic kidney disease), stage III (HCC)    Clotting disorder (HCC)    Bilateral legs-pt states plaque build up in both legs requiring intervention   Complication of anesthesia    pt states that years ago during colonoscopy at [facility] they had to abort the procedure due to some cardiac issue had to see cardiologist   COPD (chronic obstructive pulmonary disease) (HCC)     Hyperlipidemia    Hypertension    Hyperuricemia    Peripheral arterial disease (HCC)    Pre-diabetes    Prostate cancer (HCC) 2023   Sleep apnea    uses cpap    Medications:  Medications Prior to Admission  Medication Sig Dispense Refill Last Dose/Taking   allopurinol  (ZYLOPRIM ) 100 MG tablet Take 150 mg by mouth daily.   05/15/2024 Morning   amiodarone  (PACERONE ) 200 MG tablet Take 200 mg by mouth 2 (two) times daily.   05/15/2024 Morning   amoxicillin-clavulanate (AUGMENTIN) 500-125 MG tablet Take 1 tablet  by mouth 2 (two) times daily.   05/15/2024 Morning   aspirin  EC 81 MG tablet Take 81 mg by mouth every morning.   05/15/2024 Morning   chlorthalidone  (HYGROTON ) 25 MG tablet Take 25 mg by mouth daily.   05/15/2024 Morning   Cholecalciferol  (VITAMIN D3) 25 MCG (1000 UT) CAPS Take 2,000 Units by mouth daily.   05/15/2024 Morning   clopidogrel  (PLAVIX ) 75 MG tablet Take 1 tablet (75 mg total) by mouth daily. 30 tablet 11 05/15/2024 Morning   ezetimibe  (ZETIA ) 10 MG tablet Take 1 tablet (10 mg total) by mouth daily. 90 tablet 3 05/15/2024 Morning   iron  polysaccharides (NIFEREX) 150 MG capsule Take 1 capsule (150 mg total) by mouth 2 (two) times daily. 180 capsule 0 05/15/2024 Morning   lisinopril  (ZESTRIL ) 30 MG tablet Take 30 mg by mouth daily.   05/15/2024 Morning   simvastatin  (ZOCOR ) 20 MG tablet Take 20 mg by mouth in the morning.   05/15/2024 Morning   triamcinolone  ointment (KENALOG ) 0.1 % Apply 1 Application topically 2 (two) times daily as needed (psoriasis).   Unknown   zolpidem  (AMBIEN ) 10 MG tablet Take 10 mg by mouth at bedtime.   05/14/2024 Evening   pantoprazole  (PROTONIX ) 40 MG tablet Take 40 mg by mouth daily.   Unknown   Scheduled:   Chlorhexidine  Gluconate Cloth  6 each Topical Q0600   feeding supplement  237 mL Oral TID BM   midodrine   10 mg Oral TID WC   multivitamin  1 tablet Oral QHS   multivitamin with minerals  1 tablet Oral Daily   pantoprazole   40 mg Oral Daily    thiamine   100 mg Oral Daily   vancomycin   125 mg Oral QID   Infusions:   azithromycin  Stopped (05/18/24 1306)   cefTRIAXone  (ROCEPHIN )  IV Stopped (05/17/24 2138)   heparin  1,500 Units/hr (05/18/24 1700)   norepinephrine  (LEVOPHED ) Adult infusion Stopped (05/18/24 0549)   octreotide  (SANDOSTATIN ) 500 mcg in sodium chloride  0.9 % 250 mL (2 mcg/mL) infusion 50 mcg/hr (05/18/24 1700)   PRN: alteplase , docusate sodium , heparin , ipratropium-albuterol , mouth rinse, polyethylene glycol Anti-infectives (From admission, onward)    Start     Dose/Rate Route Frequency Ordered Stop   05/17/24 1945  vancomycin  (VANCOCIN ) capsule 125 mg        125 mg Oral 4 times daily 05/17/24 1850 05/27/24 1759   05/16/24 2200  cefTRIAXone  (ROCEPHIN ) 2 g in sodium chloride  0.9 % 100 mL IVPB        2 g 200 mL/hr over 30 Minutes Intravenous Every 24 hours 05/16/24 0705     05/16/24 1200  azithromycin  (ZITHROMAX ) 500 mg in sodium chloride  0.9 % 250 mL IVPB        500 mg 250 mL/hr over 60 Minutes Intravenous Every 24 hours 05/16/24 1029     05/16/24 0030  cefTRIAXone  (ROCEPHIN ) 2 g in sodium chloride  0.9 % 100 mL IVPB        2 g 200 mL/hr over 30 Minutes Intravenous  Once 05/15/24 2333 05/16/24 0022       Assessment: 68 year old male with history of ascites presenting with weakness after recent diarrheal illness. He is found to be in circulatory shock with decompensated liver cirrhosis and acute renal failure. He is admitted to the ICU for vasopressor support and is currently initiated on renal replacement therapy. Pharmacy consulted to start heparin  for concern for portal vein thrombosis. CBC overall stable, but plt trending down. No DOAC PTA.  Goal of Therapy:  Heparin  level 0.3-0.7 units/ml Monitor platelets by anticoagulation protocol: Yes   Baseline Labs 0920: aPTT 50, INR 1.5 Date Time HL Rate/Comment  0920 1643 0.29 Subtherpeatic @ 1500 units/hr  Plan:  HL Subtherapeustic Give 1450 units bolus x1  and increase heparin  infusion to 1650 units/hr Check anti-Xa level in 8 hours and daily while on heparin  Continue to monitor H&H and platelets  Kevin Gross, PharmD Clinical Pharmacist 05/18/2024 6:13 PM

## 2024-05-18 NOTE — Progress Notes (Signed)
 PHARMACY CONSULT NOTE - ELECTROLYTES  Pharmacy Consult for Electrolyte Monitoring and Replacement   Recent Labs: Height: 5' 9 (175.3 cm) Weight: 114.5 kg (252 lb 6.8 oz) IBW/kg (Calculated) : 70.7 Estimated Creatinine Clearance: 15.8 mL/min (A) (by C-G formula based on SCr of 5.73 mg/dL (H)). Potassium (mmol/L)  Date Value  05/18/2024 3.8   Magnesium  (mg/dL)  Date Value  90/79/7974 1.9   Calcium  (mg/dL)  Date Value  90/79/7974 8.0 (L)   Albumin  (g/dL)  Date Value  90/80/7974 3.2 (L)   Phosphorus (mg/dL)  Date Value  90/79/7974 4.5   Sodium (mmol/L)  Date Value  05/18/2024 140  10/12/2021 145 (H)   Corrected Ca: 8 mg/dL  Assessment  Kevin Gross is a 68 y.o. male presenting with circulatory shock and AKI. PMH significant for gout, HTN, HLD. COPD, HFpEF, hepatic cirrhosis, CKD stage IV, PAD, alcohol abuse, atrial fibrillation not on AC. Pharmacy has been consulted to monitor and replace electrolytes.  Diet: NPO sips with meds  MIVF: albumin  25% @ 60 mL/hr q6h - completed.  Pertinent medications:   Goal of Therapy: Electrolytes WNL  9/19: K 4.1, Mg 2, Phos 6.3  Plan:  Mg 2 g IV x 1 F/u with AM labs.   Thank you for allowing pharmacy to be a part of this patient's care.   Cathaleen Blanch, PharmD, BCPS [facility]  05/18/2024 8:27 AM

## 2024-05-18 NOTE — Plan of Care (Signed)

## 2024-05-18 NOTE — Consult Note (Signed)
 PHARMACY - ANTICOAGULATION CONSULT NOTE  Pharmacy Consult for Heparin   Indication: portal vein thrombosis  Allergies  Allergen Reactions   Jardiance [Empagliflozin]     Loss of balance, hypoglycemic     Patient Measurements: Height: 5' 9 (175.3 cm) Weight: 114.5 kg (252 lb 6.8 oz) IBW/kg (Calculated) : 70.7 HEPARIN  DW (KG): 96.5  Vital Signs: Temp: 97.8 F (36.6 C) (09/20 0840) Temp Source: Oral (09/20 0800) BP: 119/57 (09/20 1000) Pulse Rate: 59 (09/20 1000)  Labs: Recent Labs    05/15/24 1528 05/15/24 1958 05/15/24 2148 05/16/24 0441 05/16/24 1128 05/16/24 1658 05/16/24 2056 05/17/24 0544 05/18/24 0447  HGB 11.4*  --   --  10.8*  --   --   --  9.1* 9.1*  HCT 36.4*  --   --  33.8*  --   --   --  27.2* 27.8*  PLT 186  --   --  211  --   --   --  123* 108*  APTT  --   --   --   --   --  43*  --   --   --   LABPROT  --   --   --   --   --  17.8*  --   --   --   INR  --   --   --   --   --  1.4*  --   --   --   CREATININE 11.33*  --    < > 10.89*   < >  --  8.16* 7.87* 5.73*  TROPONINIHS 24* 26*  --   --   --   --   --   --   --    < > = values in this interval not displayed.    Estimated Creatinine Clearance: 15.8 mL/min (A) (by C-G formula based on SCr of 5.73 mg/dL (H)).   Medical History: Past Medical History:  Diagnosis Date   Alcoholic cirrhosis of liver (HCC)    Anemia 2024   Cataract 2024   CHF (congestive heart failure) (HCC)    Chronic gouty arthritis    CKD (chronic kidney disease), stage III (HCC)    Clotting disorder (HCC)    Bilateral legs-pt states plaque build up in both legs requiring intervention   Complication of anesthesia    pt states that years ago during colonoscopy at [facility] they had to abort the procedure due to some cardiac issue had to see cardiologist   COPD (chronic obstructive pulmonary disease) (HCC)    Hyperlipidemia    Hypertension    Hyperuricemia    Peripheral arterial disease (HCC)    Pre-diabetes    Prostate cancer  (HCC) 2023   Sleep apnea    uses cpap    Medications:  Medications Prior to Admission  Medication Sig Dispense Refill Last Dose/Taking   allopurinol  (ZYLOPRIM ) 100 MG tablet Take 150 mg by mouth daily.   05/15/2024 Morning   amiodarone  (PACERONE ) 200 MG tablet Take 200 mg by mouth 2 (two) times daily.   05/15/2024 Morning   amoxicillin-clavulanate (AUGMENTIN) 500-125 MG tablet Take 1 tablet by mouth 2 (two) times daily.   05/15/2024 Morning   aspirin  EC 81 MG tablet Take 81 mg by mouth every morning.   05/15/2024 Morning   chlorthalidone  (HYGROTON ) 25 MG tablet Take 25 mg by mouth daily.   05/15/2024 Morning   Cholecalciferol  (VITAMIN D3) 25 MCG (1000 UT) CAPS Take 2,000 Units by mouth daily.  05/15/2024 Morning   clopidogrel  (PLAVIX ) 75 MG tablet Take 1 tablet (75 mg total) by mouth daily. 30 tablet 11 05/15/2024 Morning   ezetimibe  (ZETIA ) 10 MG tablet Take 1 tablet (10 mg total) by mouth daily. 90 tablet 3 05/15/2024 Morning   iron  polysaccharides (NIFEREX) 150 MG capsule Take 1 capsule (150 mg total) by mouth 2 (two) times daily. 180 capsule 0 05/15/2024 Morning   lisinopril  (ZESTRIL ) 30 MG tablet Take 30 mg by mouth daily.   05/15/2024 Morning   simvastatin  (ZOCOR ) 20 MG tablet Take 20 mg by mouth in the morning.   05/15/2024 Morning   triamcinolone  ointment (KENALOG ) 0.1 % Apply 1 Application topically 2 (two) times daily as needed (psoriasis).   Unknown   zolpidem  (AMBIEN ) 10 MG tablet Take 10 mg by mouth at bedtime.   05/14/2024 Evening   pantoprazole  (PROTONIX ) 40 MG tablet Take 40 mg by mouth daily.   Unknown   Scheduled:   Chlorhexidine  Gluconate Cloth  6 each Topical Q0600   feeding supplement  237 mL Oral TID BM   midodrine   10 mg Oral TID WC   multivitamin  1 tablet Oral QHS   multivitamin with minerals  1 tablet Oral Daily   pantoprazole   40 mg Oral Daily   thiamine   100 mg Oral Daily   vancomycin   125 mg Oral QID   Infusions:   albumin  human     azithromycin  Stopped (05/17/24  1302)   cefTRIAXone  (ROCEPHIN )  IV Stopped (05/17/24 2138)   norepinephrine  (LEVOPHED ) Adult infusion Stopped (05/18/24 0549)   phytonadione  (VITAMIN K ) 10 mg in dextrose  5 % 50 mL IVPB 10 mg (05/18/24 1022)   PRN: alteplase , docusate sodium , heparin , ipratropium-albuterol , mouth rinse, polyethylene glycol Anti-infectives (From admission, onward)    Start     Dose/Rate Route Frequency Ordered Stop   05/17/24 1945  vancomycin  (VANCOCIN ) capsule 125 mg        125 mg Oral 4 times daily 05/17/24 1850 05/27/24 1759   05/16/24 2200  cefTRIAXone  (ROCEPHIN ) 2 g in sodium chloride  0.9 % 100 mL IVPB        2 g 200 mL/hr over 30 Minutes Intravenous Every 24 hours 05/16/24 0705     05/16/24 1200  azithromycin  (ZITHROMAX ) 500 mg in sodium chloride  0.9 % 250 mL IVPB        500 mg 250 mL/hr over 60 Minutes Intravenous Every 24 hours 05/16/24 1029     05/16/24 0030  cefTRIAXone  (ROCEPHIN ) 2 g in sodium chloride  0.9 % 100 mL IVPB        2 g 200 mL/hr over 30 Minutes Intravenous  Once 05/15/24 2333 05/16/24 0022       Assessment: 68 year old male with history of ascites presenting with weakness after recent diarrheal illness. He is found to be in circulatory shock with decompensated liver cirrhosis and acute renal failure. He is admitted to the ICU for vasopressor support and is currently initiated on renal replacement therapy. Pharmacy consulted to start heparin  for concern for portal vein thrombosis. CBC overall stable, but plt trending down. No DOAC PTA.   Goal of Therapy:  Heparin  level 0.3-0.7 units/ml Monitor platelets by anticoagulation protocol: Yes   Plan:  Give 5000 units bolus x 1 Start heparin  infusion at 1500 units/hr Check anti-Xa level in 8 hours and daily while on heparin  Continue to monitor H&H and platelets  Cathaleen GORMAN Blanch, PharmD, BCPS 05/18/2024,10:34 AM

## 2024-05-18 NOTE — Progress Notes (Signed)
 NAME:  Kevin Gross, MRN:  [id number], DOB:  06-19-1956, LOS: 3 ADMISSION DATE:  05/15/2024, CHIEF COMPLAINT:  Renal Failure   History of Present Illness:   68 yo M presenting to [facility] ED from home for evaluation of SOB and progressive weakness.   History obtained per chart review and patient bedside report. Patient reports progressive weakness/ dizziness, decreased urinary output and poor PO intake over the last 2 weeks. This with correlating, multiple episodes of nausea/vomiting and diarrhea until 1-2 days ago (9/15-9/16). He denies noting any blood in his emesis or stool. He also endorses RLQ mild abdominal tenderness as well as increasing abdominal distension. He has also noted dyspnea with exertion over the last month or two. He denies chest pain, new cough, recent falls, LOC, blurred vision, dysuria. He reports his HR is chronically in the 40's-50's, which does correlate with my chart review.   Of note upon arrival in ED he denied any GI symptoms or new distension, also reporting his UOP as normal. Daughter reported to EDP that the patient has continued to drink fluids but has not eaten very much.  He reported continued abstinence from alcohol and tobacco products, he denied recreational drug use. And he is aware he should not use NSAID medications.   ED course: Upon arrival, patient alert and responsive, with chronic bradycardia and acutely soft hemodynamics. Labs significant for AGMA, acute renal failure, Transaminitis, mildly elevated BNP and mildly elevated but flat troponin suggestive of slight demand ischemia with leukocytosis. Imaging revealed small bilateral pleural effusions and small ascites with small ascites. Patient became hypotensive which responded to IVF bolus, then dropped again facilitating the initiation of peripheral vasopressor support.   PCCM asked to admit for further workup and treatment.   Please see Significant Hospital Events section below for full detailed  hospital course.  Pertinent  Medical History  CKD Stage 4 Alcoholic Liver Cirrhosis COPD HLD HTN PAD OSA on CPAP Prostate Cancer Gout Prior ETOH abuse Iron  Deficiency Anemia Former smoker Bradycardia HFpEF (02/2024: LVEF 60-65%, G2DD)  Significant Hospital Events: Including procedures, antibiotic start and stop dates in addition to other pertinent events   05/15/24: Admit patient to ICU due to circulatory shock s/t unclear etiology in the setting of AKI on CKD stage 4 requiring vasopressor support. 05/16/24: remains on pressors 05/17/24: No significant events overnight.  Afebrile, remains on Levophed  and Vasopressin  (Levo requirements much improved), Peritoneal fluid not consistent with SBP.  Plan for HD today at bedside. 05/18/24: off pressors today, planned for dialysis   Interim History / Subjective:  Feels well, denies abdominal pain.  Objective    Blood pressure (!) 121/59, pulse (!) 58, temperature 98.6 F (37 C), temperature source Oral, resp. rate 16, height 5' 9 (1.753 m), weight 114.6 kg, SpO2 100%.    FiO2 (%):  [21 %] 21 %   Intake/Output Summary (Last 24 hours) at 05/18/2024 1351 Last data filed at 05/18/2024 1100 Gross per 24 hour  Intake 728.94 ml  Output 500 ml  Net 228.94 ml   Filed Weights   05/17/24 1300 05/18/24 0500 05/18/24 1310  Weight: 108.8 kg 114.5 kg 114.6 kg    Examination: Physical Exam Constitutional:      Appearance: Normal appearance.  Cardiovascular:     Rate and Rhythm: Normal rate and regular rhythm.     Pulses: Normal pulses.     Heart sounds: Normal heart sounds.  Pulmonary:     Effort: Pulmonary effort is normal.  Breath sounds: No wheezing.  Abdominal:     General: There is distension.     Palpations: Abdomen is soft.  Musculoskeletal:     Right lower leg: Edema present.     Left lower leg: Edema present.  Neurological:     General: No focal deficit present.     Mental Status: He is alert. Mental status is at  baseline.     Assessment and Plan   68 year old male with history of ascites presenting with weakness after recent diarrheal illness. He is found to be in circulatory shock with decompensated liver cirrhosis and acute renal failure. He is admitted to the ICU for vasopressor support and is currently initiated on renal replacement therapy.   #Circulatory Shock  #Septic Shock #Decompensated Liver Cirrhosis #Portal Vein Thrombosis #AKI on CKD  #AGMA #HFpEF #PAD  #HTN  #Afib #C. Diff Colitis - present on admission   Neuro - Does not appear encephalopathic on exam, and mentation is at baseline. While ammonia level is mildly elevated, this is not clinically relevant. Will monitor for development of hepatic encephalopathy and avoid psychotropic meds as able.   CV - hypotensive requiring vasopressor support with nor-epinphrine and vasopressin . He has weaned off both vasopressors with normalization in his blood pressures. Differential for this includes septic shock vs vasoplegia secondary to his decompensated liver cirrhosis. TTE obtained and cardiac function is within normal. Lactic acid is within normal suggesting preserved perfusion. Given liver cirrhosis he was volume resuscitate him with albumin .   Pulm - does have a small right sided pleural effusion that was simple appearing on ultrasound with underlying consolidated vs atelectatic RLL. Differential diagnosis includes hepatic hydrothorax vs para-pneumonic effusion. On CTX/Azithro for CAP coverage. The pleural fluid appears simple and free flowing on bedside ultrasound and without any septation to suggest a complicated pleural space.   Renal - AKI on CKD with metabolic acidosis in the setting of severe hypotension. Differential diagnosis includes pre-renal injury secondary to septic shock, dehydration from diarrhea, as well as a hepato-renal syndrome. Given history of decompensated liver cirrhosis, SBP was ruled out with paracentesis. He has  also received albumin  for volume resuscitation and was started on HD. Appreciate input from nephrology for RRT.   GI - He had developed diarrhea prior to presentation which could have resulted in his dehydration and subsequent renal failure. Stool tested and positive for C. Diff antigen but not toxin. Difficult to rule out C. Diff as the cause behind this process and admission. He also has decompensated liver cirrhosis, with ascites and varices. Para with 4.8 liters drained and no sign of SBP on fluid analysis. Considering HRS on the differential (renal above). He is not showing signs of variceal bleeding. RUQ U/S showed portal vein thrombosis, prompting initiation of heparin  gtt. Continue with albumin  repletion. Will consult with gastroenterology today given decompensated cirrhosis and now portal vein thrombosis.  MELD 3.0: 24 at 05/18/2024 10:56 AM MELD-Na: 26 at 05/18/2024 10:56 AM Calculated from: Serum Creatinine: 5.73 mg/dL (Using max of 3 mg/dL) at 0/79/7974  5:52 AM Serum Sodium: 140 mmol/L (Using max of 137 mmol/L) at 05/18/2024  4:47 AM Total Bilirubin: 1.7 mg/dL at 0/80/7974  4:55 AM Serum Albumin : 3.2 g/dL at 0/80/7974  4:55 AM INR(ratio): 1.5 at 05/18/2024 10:56 AM Age at listing (hypothetical): 79 years Sex: Male at 05/18/2024 10:56 AM   Hem/Onc- Switched to heparin  gtt for treatment of portal vein thrombosis. Discussed with GI and IR, no role for further interventions such  as thrombectomy/thrombolysis given he is clinically stable. Will administer IV vitamin K  given liver cirrhosis and follow coags.   Endo - ICU glycemic protocol.   ID - blood cultures sent, and continue treatment with CTX/Azithromycin  for CAP. Unable to send for legionella/strep urinary antigens as he has not produced urine. Paracentesis performed at bedside and SBP ruled out. He did have diarrhea prior to presentation and C. Diff testing was sent, and subsequently initiated on oral vancomycin  for treatment.              -continue CTX 2 grams daily and Azithromycin  500 mg daily             -check blood cultures  -oral vancomycin  for treatment of C. Diff  Labs   CBC: Recent Labs  Lab 05/15/24 1528 05/16/24 0441 05/17/24 0544 05/18/24 0447  WBC 13.3* 18.2* 10.9* 9.4  HGB 11.4* 10.8* 9.1* 9.1*  HCT 36.4* 33.8* 27.2* 27.8*  MCV 102.8* 98.5 96.8 97.9  PLT 186 211 123* 108*    Basic Metabolic Panel: Recent Labs  Lab 05/15/24 1528 05/15/24 2148 05/16/24 0441 05/16/24 1128 05/16/24 2056 05/17/24 0544 05/18/24 0447  NA 142   < > 143 142 140 142 140  K 5.0   < > 4.8 4.7 4.3 4.1 3.8  CL 109   < > 117* 111 107 106 101  CO2 14*   < > 16* 15* 19* 20* 22  GLUCOSE 102*   < > 104* 109* 120* 121* 112*  BUN 117*   < > 115* 118* 86* 91* 64*  CREATININE 11.33*   < > 10.89* 10.51* 8.16* 7.87* 5.73*  CALCIUM  8.2*   < > 7.6* 7.7* 7.6* 7.8* 8.0*  MG 2.4  --  2.5*  --   --  2.0 1.9  PHOS  --   --  8.5*  --   --  6.3* 4.5   < > = values in this interval not displayed.   GFR: Estimated Creatinine Clearance: 15.8 mL/min (A) (by C-G formula based on SCr of 5.73 mg/dL (H)). Recent Labs  Lab 05/15/24 1528 05/15/24 2150 05/16/24 0441 05/16/24 1056 05/17/24 0544 05/18/24 0447  WBC 13.3*  --  18.2*  --  10.9* 9.4  LATICACIDVEN  --  1.5  --  1.4  --   --     Liver Function Tests: Recent Labs  Lab 05/15/24 1528 05/16/24 0441 05/17/24 0544  AST 98* 95* 95*  ALT 49* 47* 41  ALKPHOS 187* 183* 128*  BILITOT 1.2 1.3* 1.7*  PROT 6.1* 5.8* 5.8*  ALBUMIN  2.3* 2.2* 3.2*   No results for input(s): LIPASE, AMYLASE in the last 168 hours. Recent Labs  Lab 05/16/24 1128  AMMONIA 67*    ABG    Component Value Date/Time   PHART 7.298 (L) 11/27/2021 0345   PCO2ART 39.7 11/27/2021 0345   PO2ART 116 (H) 11/27/2021 0345   HCO3 14.1 (L) 05/16/2024 0441   TCO2 21 (L) 08/08/2023 0916   ACIDBASEDEF 11.3 (H) 05/16/2024 0441   O2SAT 69.2 05/16/2024 0441     Coagulation Profile: Recent Labs  Lab  05/16/24 1658 05/18/24 1056  INR 1.4* 1.5*    Cardiac Enzymes: No results for input(s): CKTOTAL, CKMB, CKMBINDEX, TROPONINI in the last 168 hours.  HbA1C: Hgb A1c MFr Bld  Date/Time Value Ref Range Status  11/27/2021 03:28 AM 5.6 4.8 - 5.6 % Final    Comment:    (NOTE) Pre diabetes:  5.7%-6.4%  Diabetes:              >6.4%  Glycemic control for   <7.0% adults with diabetes     CBG: Recent Labs  Lab 05/15/24 2047 05/16/24 1605  GLUCAP 80 120*    Review of Systems:   N/A  Past Medical History:  He,  has a past medical history of Alcoholic cirrhosis of liver (HCC), Anemia (2024), Cataract (2024), CHF (congestive heart failure) (HCC), Chronic gouty arthritis, CKD (chronic kidney disease), stage III (HCC), Clotting disorder (HCC), Complication of anesthesia, COPD (chronic obstructive pulmonary disease) (HCC), Hyperlipidemia, Hypertension, Hyperuricemia, Peripheral arterial disease (HCC), Pre-diabetes, Prostate cancer (HCC) (2023), and Sleep apnea.   Surgical History:   Past Surgical History:  Procedure Laterality Date   ABDOMINAL AORTOGRAM W/LOWER EXTREMITY N/A 10/20/2021   Procedure: ABDOMINAL AORTOGRAM W/LOWER EXTREMITY;  Surgeon: Darron Deatrice LABOR, MD;  Location: [facility];  Service: Cardiovascular;  Laterality: N/A;   ABDOMINAL AORTOGRAM W/LOWER EXTREMITY N/A 08/08/2023   Procedure: ABDOMINAL AORTOGRAM W/LOWER EXTREMITY;  Surgeon: Serene Gaile ORN, MD;  Location: [facility];  Service: Cardiovascular;  Laterality: N/A;   ALLOGRAFT APPLICATION Bilateral 12/22/2021   Procedure: ALLOGRAFT APPLICATION;  Surgeon: Serene Gaile ORN, MD;  Location: [facility] OR;  Service: Vascular;  Laterality: Bilateral;   ANGIOPLASTY Left 11/26/2021   Procedure: LEFT INFRARENAL LITHOTRIPSY;  Surgeon: Serene Gaile ORN, MD;  Location: [facility] OR;  Service: Vascular;  Laterality: Left;   APPLICATION OF WOUND VAC Bilateral 12/13/2021   Procedure: APPLICATION OF WOUND VAC;   Surgeon: Lanis Fonda BRAVO, MD;  Location: [facility] OR;  Service: Vascular;  Laterality: Bilateral;   CARDIAC CATHETERIZATION     [facility]   CATARACT EXTRACTION W/PHACO Left 04/04/2023   Procedure: CATARACT EXTRACTION PHACO AND INTRAOCULAR LENS PLACEMENT (IOC) LEFT 8.71 00:45.5;  Surgeon: Jaye Fallow, MD;  Location: [facility];  Service: Ophthalmology;  Laterality: Left;   CATARACT EXTRACTION W/PHACO Right 04/18/2023   Procedure: CATARACT EXTRACTION PHACO AND INTRAOCULAR LENS PLACEMENT (IOC) RIGHT 4.31 00:8.2;  Surgeon: Jaye Fallow, MD;  Location: [facility];  Service: Ophthalmology;  Laterality: Right;   COLONOSCOPY     COLONOSCOPY WITH PROPOFOL  N/A 01/17/2018   Procedure: COLONOSCOPY WITH PROPOFOL ;  Surgeon: Toledo, Ladell POUR, MD;  Location: [facility];  Service: Gastroenterology;  Laterality: N/A;   COLONOSCOPY WITH PROPOFOL  N/A 09/12/2022   Procedure: COLONOSCOPY WITH PROPOFOL ;  Surgeon: Maryruth Ole DASEN, MD;  Location: [facility];  Service: Endoscopy;  Laterality: N/A;   COLONOSCOPY WITH PROPOFOL  N/A 09/05/2023   Procedure: COLONOSCOPY WITH PROPOFOL ;  Surgeon: Maryruth Ole DASEN, MD;  Location: [facility];  Service: Endoscopy;  Laterality: N/A;   ENDARTERECTOMY FEMORAL Bilateral 11/26/2021   Procedure: BILATERAL FEMORAL ENDARTERECTOMY WITH VEIN PATCH ANGIOPLASTY;  Surgeon: Serene Gaile ORN, MD;  Location: [facility] OR;  Service: Vascular;  Laterality: Bilateral;   ENTEROSCOPY N/A 09/05/2023   Procedure: ENTEROSCOPY;  Surgeon: Maryruth Ole DASEN, MD;  Location: [facility];  Service: Endoscopy;  Laterality: N/A;   ESOPHAGOGASTRODUODENOSCOPY (EGD) WITH PROPOFOL  N/A 01/17/2018   Procedure: ESOPHAGOGASTRODUODENOSCOPY (EGD) WITH PROPOFOL ;  Surgeon: Toledo, Ladell POUR, MD;  Location: [facility];  Service: Gastroenterology;  Laterality: N/A;   ESOPHAGOGASTRODUODENOSCOPY (EGD) WITH PROPOFOL  N/A 09/12/2022   Procedure: ESOPHAGOGASTRODUODENOSCOPY (EGD) WITH PROPOFOL ;   Surgeon: Maryruth Ole DASEN, MD;  Location: [facility];  Service: Endoscopy;  Laterality: N/A;   EYE SURGERY     GROIN DEBRIDEMENT Bilateral 12/16/2021   Procedure: IRRIGATION AND DEBRIDEMENT OF BILATERAL GROINS;  Surgeon:  Serene Gaile ORN, MD;  Location: [facility] OR;  Service: Vascular;  Laterality: Bilateral;   INCISION AND DRAINAGE OF WOUND Bilateral 12/13/2021   Procedure: IRRIGATION AND DEBRIDEMENT OF BILATERAL GROIN WOUNDS;  Surgeon: Lanis Fonda BRAVO, MD;  Location: [facility] OR;  Service: Vascular;  Laterality: Bilateral;   INSERTION OF ILIAC STENT Bilateral 11/26/2021   Procedure: BILATERAL ILIAC STENTING USING 15mmX80mm INNOVA STENT ON LEFT ILIAC AND 22mmX59mm, 55mmX79mm VBX AND 74mmX7.5cm VIABAHN STENT ON RIGHT ILIAC;  Surgeon: Serene Gaile ORN, MD;  Location: [facility] OR;  Service: Vascular;  Laterality: Bilateral;   INSERTION OF SEEDS IN PROSTATE     ORIF ANKLE FRACTURE Right    PERIPHERAL INTRAVASCULAR LITHOTRIPSY  08/08/2023   Procedure: PERIPHERAL INTRAVASCULAR LITHOTRIPSY;  Surgeon: Serene Gaile ORN, MD;  Location: [facility];  Service: Cardiovascular;;   PROSTATE BIOPSY N/A 04/01/2021   Procedure: PROSTATE BIOPSY GRAYCE;  Surgeon: Kassie Ozell SAUNDERS, MD;  Location: [facility] ORS;  Service: Urology;  Laterality: N/A;   VEIN HARVEST Bilateral 11/26/2021   Procedure: VEIN HARVEST OF BILATERAL SPAHENOUS VEINS;  Surgeon: Serene Gaile ORN, MD;  Location: [facility];  Service: Vascular;  Laterality: Bilateral;   WOUND DEBRIDEMENT Bilateral 12/22/2021   Procedure: INCISION AND DEBRIDEMENT OF BILATERAL GROINS;  Surgeon: Serene Gaile ORN, MD;  Location: [facility];  Service: Vascular;  Laterality: Bilateral;     Social History:   reports that he quit smoking about 9 years ago. His smoking use included cigarettes. He started smoking about 56 years ago. He has a 47 pack-year smoking history. He has never been exposed to tobacco smoke. He has never used smokeless tobacco. He reports that he does not currently use  alcohol. He reports that he does not use drugs.   Family History:  His family history includes Heart disease in his brother and mother.   Allergies Allergies  Allergen Reactions   Jardiance [Empagliflozin]     Loss of balance, hypoglycemic      Home Medications  Prior to Admission medications   Medication Sig Start Date End Date Taking? Authorizing Provider  allopurinol  (ZYLOPRIM ) 100 MG tablet Take 150 mg by mouth daily.   Yes [provider]  amiodarone  (PACERONE ) 200 MG tablet Take 200 mg by mouth 2 (two) times daily.   Yes [provider]  amoxicillin-clavulanate (AUGMENTIN) 500-125 MG tablet Take 1 tablet by mouth 2 (two) times daily. 05/06/24  Yes [provider]  aspirin  EC 81 MG tablet Take 81 mg by mouth every morning.   Yes [provider]  chlorthalidone  (HYGROTON ) 25 MG tablet Take 25 mg by mouth daily.   Yes [provider]  Cholecalciferol  (VITAMIN D3) 25 MCG (1000 UT) CAPS Take 2,000 Units by mouth daily.   Yes [provider]  clopidogrel  (PLAVIX ) 75 MG tablet Take 1 tablet (75 mg total) by mouth daily. 01/16/23  Yes Baglia, Corrina, PA-C  ezetimibe  (ZETIA ) 10 MG tablet Take 1 tablet (10 mg total) by mouth daily. 04/10/18  Yes Gollan, Timothy J, MD  iron  polysaccharides (NIFEREX) 150 MG capsule Take 1 capsule (150 mg total) by mouth 2 (two) times daily. 03/11/24 06/09/24 Yes Sreenath, Sudheer B, MD  lisinopril  (ZESTRIL ) 30 MG tablet Take 30 mg by mouth daily.   Yes [provider]  simvastatin  (ZOCOR ) 20 MG tablet Take 20 mg by mouth in the morning.   Yes [provider]  triamcinolone  ointment (KENALOG ) 0.1 % Apply 1 Application topically 2 (two) times daily as needed (psoriasis). 07/18/23  Yes [provider]  zolpidem  (AMBIEN ) 10 MG tablet Take 10 mg by mouth at bedtime.   Yes [provider]  pantoprazole  (PROTONIX ) 40 MG tablet Take 40 mg by mouth daily.    [provider]      The patient is critically ill due to circulatory shock, renal failure, liver cirrhosis.  Critical care was necessary to treat or prevent imminent or life-threatening deterioration. Critical care time was spent by me on the following activities: development of a treatment plan with the patient and/or surrogate as well as nursing, discussions with consultants, evaluation of the patient's response to treatment, examination of the patient, obtaining a history from the patient or surrogate, ordering and performing treatments and interventions, ordering and review of laboratory studies, ordering and review of radiographic studies, review of telemetry data including pulse oximetry, re-evaluation of patient's condition and participation in multidisciplinary rounds.   I personally spent 32 minutes providing critical care not including any separately billable procedures.   Belva November, MD [facility] 05/18/2024 5:41 PM

## 2024-05-19 DIAGNOSIS — I739 Peripheral vascular disease, unspecified: Secondary | ICD-10-CM

## 2024-05-19 DIAGNOSIS — N179 Acute kidney failure, unspecified: Secondary | ICD-10-CM | POA: Diagnosis not present

## 2024-05-19 DIAGNOSIS — D649 Anemia, unspecified: Secondary | ICD-10-CM

## 2024-05-19 DIAGNOSIS — R579 Shock, unspecified: Secondary | ICD-10-CM

## 2024-05-19 DIAGNOSIS — K7031 Alcoholic cirrhosis of liver with ascites: Secondary | ICD-10-CM | POA: Diagnosis not present

## 2024-05-19 DIAGNOSIS — J189 Pneumonia, unspecified organism: Secondary | ICD-10-CM | POA: Diagnosis not present

## 2024-05-19 DIAGNOSIS — E785 Hyperlipidemia, unspecified: Secondary | ICD-10-CM

## 2024-05-19 DIAGNOSIS — I81 Portal vein thrombosis: Secondary | ICD-10-CM

## 2024-05-19 DIAGNOSIS — I10 Essential (primary) hypertension: Secondary | ICD-10-CM

## 2024-05-19 DIAGNOSIS — R197 Diarrhea, unspecified: Secondary | ICD-10-CM

## 2024-05-19 LAB — CBC
HCT: 27.6 % — ABNORMAL LOW (ref 39.0–52.0)
Hemoglobin: 8.9 g/dL — ABNORMAL LOW (ref 13.0–17.0)
MCH: 32 pg (ref 26.0–34.0)
MCHC: 32.2 g/dL (ref 30.0–36.0)
MCV: 99.3 fL (ref 80.0–100.0)
Platelets: 104 K/uL — ABNORMAL LOW (ref 150–400)
RBC: 2.78 MIL/uL — ABNORMAL LOW (ref 4.22–5.81)
RDW: 22.6 % — ABNORMAL HIGH (ref 11.5–15.5)
WBC: 10.6 K/uL — ABNORMAL HIGH (ref 4.0–10.5)
nRBC: 0.2 % (ref 0.0–0.2)

## 2024-05-19 LAB — BASIC METABOLIC PANEL WITH GFR
Anion gap: 10 (ref 5–15)
BUN: 47 mg/dL — ABNORMAL HIGH (ref 8–23)
CO2: 26 mmol/L (ref 22–32)
Calcium: 7.7 mg/dL — ABNORMAL LOW (ref 8.9–10.3)
Chloride: 100 mmol/L (ref 98–111)
Creatinine, Ser: 4.65 mg/dL — ABNORMAL HIGH (ref 0.61–1.24)
GFR, Estimated: 13 mL/min — ABNORMAL LOW (ref >60)
Glucose, Bld: 110 mg/dL — ABNORMAL HIGH (ref 70–99)
Potassium: 3.6 mmol/L (ref 3.5–5.1)
Sodium: 136 mmol/L (ref 135–145)

## 2024-05-19 LAB — APTT: aPTT: >200 s (ref 24–36)

## 2024-05-19 LAB — PROTIME-INR
INR: 1.6 — ABNORMAL HIGH (ref 0.8–1.2)
Prothrombin Time: 19.4 s — ABNORMAL HIGH (ref 11.4–15.2)

## 2024-05-19 LAB — HEPARIN LEVEL (UNFRACTIONATED): Heparin Unfractionated: 0.24 [IU]/mL — ABNORMAL LOW (ref 0.30–0.70)

## 2024-05-19 LAB — MAGNESIUM: Magnesium: 1.8 mg/dL (ref 1.7–2.4)

## 2024-05-19 MED ORDER — POLYSACCHARIDE IRON COMPLEX 150 MG PO CAPS
150.0000 mg | ORAL_CAPSULE | Freq: Two times a day (BID) | ORAL | Status: DC
  Administered 2024-05-19 – 2024-05-24 (×11): 150 mg via ORAL
  Filled 2024-05-19 (×12): qty 1

## 2024-05-19 MED ORDER — HEPARIN (PORCINE) 25000 UT/250ML-% IV SOLN: 1350.0000 [IU]/h | INTRAVENOUS | Status: DC

## 2024-05-19 MED ORDER — ASPIRIN 81 MG PO TBEC
81.0000 mg | DELAYED_RELEASE_TABLET | ORAL | Status: DC
Start: 2024-05-20 — End: 2024-05-19

## 2024-05-19 MED ORDER — APIXABAN 5 MG PO TABS
10.0000 mg | ORAL_TABLET | Freq: Two times a day (BID) | ORAL | Status: AC
  Administered 2024-05-19 – 2024-05-20 (×3): 10 mg via ORAL
  Filled 2024-05-19 (×3): qty 2

## 2024-05-19 MED ORDER — EZETIMIBE 10 MG PO TABS
10.0000 mg | ORAL_TABLET | Freq: Every day | ORAL | Status: DC
Start: 2024-05-19 — End: 2024-05-25
  Administered 2024-05-19 – 2024-05-24 (×6): 10 mg via ORAL
  Filled 2024-05-19 (×6): qty 1

## 2024-05-19 MED ORDER — APIXABAN 5 MG PO TABS: 5.0000 mg | ORAL_TABLET | Freq: Two times a day (BID) | ORAL | Status: DC

## 2024-05-19 MED ORDER — CLOPIDOGREL BISULFATE 75 MG PO TABS: 75.0000 mg | ORAL_TABLET | Freq: Every day | ORAL | Status: DC

## 2024-05-19 MED ORDER — SIMVASTATIN 20 MG PO TABS
20.0000 mg | ORAL_TABLET | Freq: Every day | ORAL | Status: DC
  Administered 2024-05-19 – 2024-05-23 (×5): 20 mg via ORAL
  Filled 2024-05-19 (×6): qty 1

## 2024-05-19 NOTE — Plan of Care (Signed)
 Continuing with plan of care.

## 2024-05-19 NOTE — Progress Notes (Signed)
 Progress Note   Patient: Kevin Gross FMW:[id number] DOB: Jan 27, 1956 DOA: 05/15/2024     4 DOS: the patient was seen and examined on 05/19/2024   Brief hospital course: PCCM transfer for 05/19/2024  Taken from prior notes.  ANIKET PAYE is a 68 y.o. male with past medical history of hypertension, hyperlipidemia, CHF, CKD, iron  deficiency anemia, PAD, alcohol abuse, cirrhosis who presents to the ED complaining of shortness of breath, generalized weakness, nausea vomiting and diarrhea until a day prior to the admission, admitted on 9/18.  Patient continued to drink fluid but otherwise poor p.o. intake.  Patient reported abstinence from alcohol and tobacco, no recreational drug use.  No NSAID use  On presentation patient was alert and responsive, chronic bradycardia, hypotensive which did not responded well to IV fluid so started on pressors and admitted in ICU with concern of circulatory shock.  On admission patient was found to have acute metabolic acidosis with bicarb of 14, AKI with underlying CKD stage IV-patient became  oliguric and was started on dialysis during current hospitalization. Metabolic acidosis improved.  Patient was initially thought to have septic shock, all cultures which include blood and acetic fluid remain negative, sepsis ruled out.  Likely has circulatory shock secondary to his decompensated liver cirrhosis.  Significant ascites s/p paracentesis with removal of 4.8L-cultures negative. MELD sodium score of 26 on 05/18/2024.    Liver ultrasound with portal vein thrombosis, received vitamin K  and started on heparin  infusion.  Stool studies for C. difficile with positive antigen only, negative PCR and toxin-as patient was sick decided to start on p.o. vancomycin .  Patient was also started on ceftriaxone  and Zithromax  for concern of pneumonia-no radiologic evidence and unable to get Legionella/strep urinary antigen as he was not making urine.  GI was consulted for  decompensated liver cirrhosis and portal thrombosis, recommending continue anticoagulation for at least 6 months with cross-sectional imaging every 3 months to assess the response.  Started on octreotide  for 72-hour Patient already scheduled for outpatient colonoscopy on 05/27/2024 and no further GI workup needed at this time so they signed off.  Patient was transitioned off from vasopressors.  Received 3 days of dialysis and care is being transferred to [facility].  9/21: Vital stable with borderline soft blood pressure, labs with INR of 1.6, hemoglobin of 8.9, mild leukocytosis at 10.6, creatinine improved to 4.65 with BUN of 47.  Assessment and Plan: * Shock circulatory (HCC) Multifactorial likely with volume depletion with gastroenteritis, underlying liver cirrhosis with concern of decompensation. Sepsis was considered but there was no obvious source of infection so sepsis ruled out. Initially required pressors, currently blood pressure within lower goal with midodrine . - Continue to monitor -Continue with midodrine   Acute renal failure (HCC) History of underlying CKD stage IV. Most likely now ESRD. Patient with significant AKI, multifactorial with volume depletion and hypotension.  Renal ultrasound was negative for any obstruction.  Nephrology was consulted and patient was started on hemodialysis, received 3 sessions.  UOP of 200 in 24-hour recorded. - Nephrology is on board -They will determine whether to continue dialysis tomorrow or they can give him a break with a close outpatient follow-up. - If we need to continue dialysis then will need outpatient dialysis chair before discharge  Community acquired pneumonia of right lower lobe of lung Concern of right lower lobe pleural effusion which can be hydrothorax secondary to cirrhosis versus underlying pneumonia. No significant upper respiratory symptoms but patient was started on antibiotics. - Will complete a  5-day course of ceftriaxone  and  Zithromax   Alcoholic cirrhosis of liver with ascites (HCC) Concern of decompensation, s/p paracentesis with removal of 4.8 L of fluid, cultures negative for any SBP. GI was consulted and they added octreotide  infusion for 72-hour. -Avoid constipation-need to use lactulose to titrate for 2-3 soft bowel movements -Outpatient GI follow-up  Portal vein thrombosis Patient was found to have portal vein thrombosis.  No indication for any intervention.  Patient was started on heparin  infusion and GI is recommending 6 months of anticoagulation and repeat imaging in 36-month to assess the response. - Switching heparin  with Eliquis  - Discontinuing home aspirin  and Plavix  to decrease the risk of bleeding  Hypertension Patient was on lisinopril  and chlorthalidone  at home which are currently on hold due to softer blood pressure and patient is on midodrine . - Continue with midodrine  -Monitor blood pressure  Peripheral arterial disease (HCC) Holding home aspirin  and Plavix  as patient is being started on Eliquis . - Continue with statin  Diarrhea Patient had diarrhea for the past 2 days before admission, has been resolved now.  C. difficile with positive antigen, negative PCR and toxin so less likely but he he was started on p.o. vancomycin  because of the risk and being very sick. GI pathogen panel negative. - Continue with p.o. vancomycin -likely can stop a day after completing antibiotics for concern of pneumonia  Anemia Patient with history of iron  deficiency anemia.  Hemoglobin currently stable and no sign of bleeding. - Continue with iron  supplement -Monitor hemoglobin closely  Hyperlipidemia - Continue statin and Zetia       Subjective: Patient was seen and examined today.  Denies any pain or shortness of breath.  No more diarrhea.  Eating and drinking okay.  Physical Exam: Vitals:   05/19/24 0800 05/19/24 0900 05/19/24 1000 05/19/24 1100  BP: (!) 117/56 (!) 108/54 125/60 (!) 100/53   Pulse: (!) 50 (!) 48 (!) 50 (!) 47  Resp: 14 14 15 15   Temp: 98.1 F (36.7 C)     TempSrc: Oral     SpO2: 95% 98% 98% 98%  Weight:      Height:       General.  Obese gentleman, in no acute distress. Pulmonary.  Lungs clear bilaterally, normal respiratory effort. CV.  Regular rate and rhythm, no JVD, rub or murmur. Abdomen.  Soft, nontender, mildly distended, BS positive. CNS.  Alert and oriented .  No focal neurologic deficit. Extremities.  No LE edema, 1+ upper extremity edema, pulses intact and symmetrical. Psychiatry.  Judgment and insight appears normal.   Data Reviewed: Prior data reviewed  Family Communication: Talked with wife on phone.  Disposition: Status is: Inpatient Remains inpatient appropriate because: Severity of illness.  Planned Discharge Destination: Home with Home Health  DVT prophylaxis.  Eliquis  Time spent: 50 minutes  This record has been created using Conservation officer, historic buildings. Errors have been sought and corrected,but may not always be located. Such creation errors do not reflect on the standard of care.   Author: Amaryllis Dare, MD 05/19/2024 12:35 PM  For on call review [email or web address].

## 2024-05-19 NOTE — Assessment & Plan Note (Signed)
 Patient with history of iron  deficiency anemia.  Hemoglobin currently stable and no sign of bleeding. - Continue with iron  supplement -Monitor hemoglobin closely

## 2024-05-19 NOTE — Hospital Course (Signed)
 PCCM transfer for 05/19/2024  Taken from prior notes.  Kevin Gross is a 68 y.o. male with past medical history of hypertension, hyperlipidemia, CHF, CKD, iron  deficiency anemia, PAD, alcohol abuse, cirrhosis who presents to the ED complaining of shortness of breath, generalized weakness, nausea vomiting and diarrhea until a day prior to the admission, admitted on 9/18.  Patient continued to drink fluid but otherwise poor p.o. intake.  Patient reported abstinence from alcohol and tobacco, no recreational drug use.  No NSAID use  On presentation patient was alert and responsive, chronic bradycardia, hypotensive which did not responded well to IV fluid so started on pressors and admitted in ICU with concern of circulatory shock.  On admission patient was found to have acute metabolic acidosis with bicarb of 14, AKI with underlying CKD stage IV-patient became  oliguric and was started on dialysis during current hospitalization. Metabolic acidosis improved.  Patient was initially thought to have septic shock, all cultures which include blood and acetic fluid remain negative, sepsis ruled out.  Likely has circulatory shock secondary to his decompensated liver cirrhosis.  Significant ascites s/p paracentesis with removal of 4.8L-cultures negative. MELD sodium score of 26 on 05/18/2024.    Liver ultrasound with portal vein thrombosis, received vitamin K  and started on heparin  infusion.  Stool studies for C. difficile with positive antigen only, negative PCR and toxin-as patient was sick decided to start on p.o. vancomycin .  Patient was also started on ceftriaxone  and Zithromax  for concern of pneumonia-no radiologic evidence and unable to get Legionella/strep urinary antigen as he was not making urine.  GI was consulted for decompensated liver cirrhosis and portal thrombosis, recommending continue anticoagulation for at least 6 months with cross-sectional imaging every 3 months to assess the response.   Started on octreotide  for 72-hour Patient already scheduled for outpatient colonoscopy on 05/27/2024 and no further GI workup needed at this time so they signed off.  Patient was transitioned off from vasopressors.  Received 3 days of dialysis and care is being transferred to [facility].  9/21: Vital stable with borderline soft blood pressure, labs with INR of 1.6, hemoglobin of 8.9, mild leukocytosis at 10.6, creatinine improved to 4.65 with BUN of 47.  9/22: Vital stable with some increase in creatinine, neurology ordered permanent HD catheter placement-likely be done tomorrow.  Patient will need outpatient dialysis chair before discharge. PT is recommending home health

## 2024-05-19 NOTE — Progress Notes (Signed)
 [facility]  ROUNDING NOTE   Subjective:   Patient has underwent 3 days of consecutive dialysis treatment. Creatinine down to 4.65. Urine output only 200 cc over the preceding 24 hours. Patient resting comfortably in bed.  09/20 0701 - 09/21 0700 In: 1784.3 [P.O.:560; I.V.:757.1; IV Piggyback:467.2] Out: 1600 [Urine:200] Lab Results  Component Value Date   CREATININE 4.65 (H) 05/19/2024   CREATININE 5.73 (H) 05/18/2024   CREATININE 7.87 (H) 05/17/2024      Objective:  Vital signs in last 24 hours:  Temp:  [97.8 F (36.6 C)-98.7 F (37.1 C)] 98.2 F (36.8 C) (09/21 0000) Pulse Rate:  [48-101] 48 (09/21 0900) Resp:  [12-20] 14 (09/21 0900) BP: (92-130)/(46-67) 108/54 (09/21 0900) SpO2:  [92 %-100 %] 98 % (09/21 0900) FiO2 (%):  [21 %] 21 % (09/21 0000) Weight:  [114.6 kg-115 kg] 115 kg (09/21 0459)  Weight change: 5.4 kg Filed Weights   05/18/24 0500 05/18/24 1310 05/19/24 0459  Weight: 114.5 kg 114.6 kg 115 kg    Intake/Output: I/O last 3 completed shifts: In: 2128.4 [P.O.:560; I.V.:832.3; IV Piggyback:736.1] Out: 1800 [Urine:400; Other:1400]   Intake/Output this shift:  Total I/O In: 25 [I.V.:25] Out: -   Physical Exam: General: NAD  Head: Normocephalic, atraumatic. Moist oral mucosal membranes  Eyes: Anicteric  Neck: Supple  Lungs:  Clear to auscultation  Heart: Regular rate and rhythm  Abdomen:  Soft, nontender  Extremities: 1+ peripheral edema.  Neurologic: Awake, alert, conversant  Skin: Warm,dry, no rash  Access: Lt femoral Trialysis cath    Basic Metabolic Panel: Recent Labs  Lab 05/15/24 1528 05/15/24 2148 05/16/24 0441 05/16/24 1128 05/16/24 2056 05/17/24 0544 05/18/24 0447 05/19/24 0435  NA 142   < > 143 142 140 142 140 136  K 5.0   < > 4.8 4.7 4.3 4.1 3.8 3.6  CL 109   < > 117* 111 107 106 101 100  CO2 14*   < > 16* 15* 19* 20* 22 26  GLUCOSE 102*   < > 104* 109* 120* 121* 112* 110*  BUN 117*   < > 115* 118* 86*  91* 64* 47*  CREATININE 11.33*   < > 10.89* 10.51* 8.16* 7.87* 5.73* 4.65*  CALCIUM  8.2*   < > 7.6* 7.7* 7.6* 7.8* 8.0* 7.7*  MG 2.4  --  2.5*  --   --  2.0 1.9 1.8  PHOS  --   --  8.5*  --   --  6.3* 4.5  --    < > = values in this interval not displayed.    Liver Function Tests: Recent Labs  Lab 05/15/24 1528 05/16/24 0441 05/17/24 0544  AST 98* 95* 95*  ALT 49* 47* 41  ALKPHOS 187* 183* 128*  BILITOT 1.2 1.3* 1.7*  PROT 6.1* 5.8* 5.8*  ALBUMIN  2.3* 2.2* 3.2*   No results for input(s): LIPASE, AMYLASE in the last 168 hours. Recent Labs  Lab 05/16/24 1128  AMMONIA 67*    CBC: Recent Labs  Lab 05/15/24 1528 05/16/24 0441 05/17/24 0544 05/18/24 0447 05/19/24 0435  WBC 13.3* 18.2* 10.9* 9.4 10.6*  HGB 11.4* 10.8* 9.1* 9.1* 8.9*  HCT 36.4* 33.8* 27.2* 27.8* 27.6*  MCV 102.8* 98.5 96.8 97.9 99.3  PLT 186 211 123* 108* 104*    Cardiac Enzymes: No results for input(s): CKTOTAL, CKMB, CKMBINDEX, TROPONINI in the last 168 hours.  BNP: Invalid input(s): POCBNP  CBG: Recent Labs  Lab 05/15/24 2047 05/16/24 1605  GLUCAP 80  120*    Microbiology: Results for orders placed or performed during the hospital encounter of 05/15/24  MRSA Next Gen by PCR, Nasal     Status: None   Collection Time: 05/15/24  8:57 PM   Specimen: Nasal Mucosa; Nasal Swab  Result Value Ref Range Status   MRSA by PCR Next Gen NOT DETECTED NOT DETECTED Final    Comment: (NOTE) The GeneXpert MRSA Assay (FDA approved for NASAL specimens only), is one component of a comprehensive MRSA colonization surveillance program. It is not intended to diagnose MRSA infection nor to guide or monitor treatment for MRSA infections. Test performance is not FDA approved in patients less than 38 years old. Performed at [facility], [address] [place], [place] [address]   SARS Coronavirus 2 by RT PCR (hospital order, performed in [facility] hospital lab) *cepheid single result  test* Anterior Nasal Swab     Status: None   Collection Time: 05/15/24  8:58 PM   Specimen: Anterior Nasal Swab  Result Value Ref Range Status   SARS Coronavirus 2 by RT PCR NEGATIVE NEGATIVE Final    Comment: (NOTE) SARS-CoV-2 target nucleic acids are NOT DETECTED.  The SARS-CoV-2 RNA is generally detectable in upper and lower respiratory specimens during the acute phase of infection. The lowest concentration of SARS-CoV-2 viral copies this assay can detect is 250 copies / mL. A negative result does not preclude SARS-CoV-2 infection and should not be used as the sole basis for treatment or other patient management decisions.  A negative result may occur with improper specimen collection / handling, submission of specimen other than nasopharyngeal swab, presence of viral mutation(s) within the areas targeted by this assay, and inadequate number of viral copies (<250 copies / mL). A negative result must be combined with clinical observations, patient history, and epidemiological information.  Fact Sheet for Patients:   RoadLapTop.co.za  Fact Sheet for Healthcare Providers: http://kim-miller.com/  This test is not yet approved or  cleared by the [place]  FDA and has been authorized for detection and/or diagnosis of SARS-CoV-2 by FDA under an Emergency Use Authorization (EUA).  This EUA will remain in effect (meaning this test can be used) for the duration of the COVID-19 declaration under Section 564(b)(1) of the Act, 21 U.S.C. section 360bbb-3(b)(1), unless the authorization is terminated or revoked sooner.  Performed at [facility], [address] [place], [place] [address]   Body fluid culture w Gram Stain     Status: None (Preliminary result)   Collection Time: 05/16/24  2:50 PM   Specimen: PATH Cytology Peritoneal fluid  Result Value Ref Range Status   Specimen Description   Final    PERITONEAL Performed at  [facility], [address] [place], [place] [address]    Special Requests   Final    NONE Performed at [facility], [address] [place], [place] [address]    Gram Stain   Final    WBC PRESENT, PREDOMINANTLY MONONUCLEAR NO ORGANISMS SEEN CYTOSPIN SMEAR    Culture   Final    NO GROWTH 2 DAYS Performed at [facility], [address] [place], [place] [address]    Report Status PENDING  Incomplete  Culture, blood (Routine X 2) w Reflex to ID Panel     Status: None (Preliminary result)   Collection Time: 05/16/24  6:15 PM   Specimen: BLOOD  Result Value Ref Range Status   Specimen Description BLOOD BLOOD RIGHT ARM  Final   Special Requests   Final    BOTTLES DRAWN AEROBIC AND ANAEROBIC Blood Culture adequate volume   Culture   Final    NO GROWTH 3 DAYS Performed at [facility], [address] [place], [place] [address]    Report Status PENDING  Incomplete  Culture, blood (Routine X 2) w Reflex to ID Panel     Status: None (Preliminary result)   Collection Time: 05/16/24  6:28 PM   Specimen: BLOOD  Result Value Ref Range Status   Specimen Description BLOOD BLOOD RIGHT ARM  Final   Special Requests   Final    BOTTLES DRAWN AEROBIC AND ANAEROBIC Blood Culture adequate volume   Culture   Final    NO GROWTH 3 DAYS Performed at [facility], [address], [place], [place] [address]    Report Status PENDING  Incomplete  Gastrointestinal Panel by PCR , Stool     Status: None   Collection Time: 05/17/24  3:20 AM   Specimen: Stool  Result Value Ref Range Status   Campylobacter species NOT DETECTED NOT DETECTED Final   Plesimonas shigelloides NOT DETECTED NOT DETECTED Final   Salmonella species NOT DETECTED NOT DETECTED Final   Yersinia enterocolitica NOT DETECTED NOT DETECTED Final   Vibrio species NOT DETECTED NOT DETECTED Final   Vibrio cholerae NOT DETECTED NOT DETECTED Final   Enteroaggregative E coli (EAEC) NOT  DETECTED NOT DETECTED Final   Enteropathogenic E coli (EPEC) NOT DETECTED NOT DETECTED Final   Enterotoxigenic E coli (ETEC) NOT DETECTED NOT DETECTED Final   Shiga like toxin producing E coli (STEC) NOT DETECTED NOT DETECTED Final   Shigella/Enteroinvasive E coli (EIEC) NOT DETECTED NOT DETECTED Final   Cryptosporidium NOT DETECTED NOT DETECTED Final   Cyclospora cayetanensis NOT DETECTED NOT DETECTED Final   Entamoeba histolytica NOT DETECTED NOT DETECTED Final   Giardia lamblia NOT DETECTED NOT DETECTED Final   Adenovirus F40/41 NOT DETECTED NOT DETECTED Final   Astrovirus NOT DETECTED NOT DETECTED Final   Norovirus GI/GII NOT DETECTED NOT DETECTED Final   Rotavirus A NOT DETECTED NOT DETECTED Final   Sapovirus (I, II, IV, and V) NOT DETECTED NOT DETECTED Final    Comment: Performed at [facility], [address] [place], [place] [address]  C Difficile Quick Screen w PCR reflex     Status: Abnormal   Collection Time: 05/17/24  4:34 PM   Specimen: STOOL  Result Value Ref Range Status   C Diff antigen POSITIVE (A) NEGATIVE Final   C Diff toxin NEGATIVE NEGATIVE Final   C Diff interpretation Results are indeterminate. See PCR results.  Final    Comment: Performed at [facility], [address] [place], [place] [address]  C. Diff by PCR, Reflexed     Status: None   Collection Time: 05/17/24  4:34 PM  Result Value Ref Range Status   Toxigenic C. Difficile by PCR NEGATIVE NEGATIVE Final    Comment: Patient is colonized with non toxigenic C. difficile. May not need treatment unless significant symptoms are present.   Hypervirulent Strain PRESUMPTIVE NEGATIVE PRESUMPTIVE NEGATIVE Final    Comment: Performed at [facility], [address] [place], [place] [address]    Coagulation Studies: Recent Labs    05/16/24 1658 05/18/24 1056 05/19/24 0435  LABPROT 17.8* 18.7* 19.4*  INR 1.4* 1.5* 1.6*    Urinalysis: No results for input(s): COLORURINE,  LABSPEC, PHURINE, GLUCOSEU, HGBUR, BILIRUBINUR, KETONESUR, PROTEINUR, UROBILINOGEN, NITRITE,  LEUKOCYTESUR in the last 72 hours.  Invalid input(s): APPERANCEUR     Imaging: US  LIVER DOPPLER Result Date: 05/18/2024 CLINICAL DATA:  Cirrhosis and ascites. Status post large volume paracentesis on 05/16/2024. EXAM: DUPLEX ULTRASOUND OF LIVER TECHNIQUE: Color and duplex Doppler ultrasound was performed to evaluate the hepatic in-flow and out-flow vessels. COMPARISON:  CT abdomen 05/15/2024 FINDINGS: Liver: The liver demonstrates cirrhotic morphology with surface nodularity and right lobe atrophy. No focal lesion, mass or intrahepatic biliary ductal dilatation. Main Portal Vein size: 1.6 cm Portal Vein Velocities Main Prox:  23 cm/sec Main Mid: 24 cm/sec Main Dist:  16 cm/sec Right: Not able to be sampled. Left: 13 cm/sec Hepatic Vein Velocities Right:  21 cm/sec Middle:  26 cm/sec Left:  19 cm/sec IVC: Present and patent with normal respiratory phasicity. Hepatic Artery Velocity:  77 cm/sec Splenic Vein Velocity:  10 cm/sec Spleen: 15.7 cm x 14.3 cm x 5.2 cm with a total volume of 614 cm^3 (411 cm^3 is upper limit normal) Portal Vein Occlusion/Thrombus: Yes Splenic Vein Occlusion/Thrombus: No Ascites: Present Varices: None Thrombus identified in the main portal vein that likely extends into the intrahepatic right portal vein. The thrombus appears to be nonocclusive with some flow present in the main portal vein and intrahepatic left portal vein but the thrombus may be on the verge of becoming completely occlusive. No evidence of hepatic veno-occlusive disease. The splenic vein is patent. Small amount of ascites present in the right upper quadrant. There is moderate splenomegaly. IMPRESSION: 1. Cirrhosis of the liver with evidence of portal hypertension including splenomegaly and ascites. 2. Nonocclusive thrombus in the main portal vein that likely extends into the intrahepatic right portal  vein. The thrombus may be on the verge of becoming completely occlusive. Electronically Signed   By: Marcey Moan M.D.   On: 05/18/2024 10:07     Medications:    azithromycin  Stopped (05/18/24 1306)   cefTRIAXone  (ROCEPHIN )  IV Stopped (05/18/24 2304)   heparin  1,350 Units/hr (05/19/24 0800)   norepinephrine  (LEVOPHED ) Adult infusion Stopped (05/18/24 0549)   octreotide  (SANDOSTATIN ) 500 mcg in sodium chloride  0.9 % 250 mL (2 mcg/mL) infusion 50 mcg/hr (05/19/24 0848)    Chlorhexidine  Gluconate Cloth  6 each Topical Q0600   feeding supplement  237 mL Oral TID BM   melatonin  5 mg Oral QHS   midodrine   10 mg Oral TID WC   multivitamin  1 tablet Oral QHS   multivitamin with minerals  1 tablet Oral Daily   pantoprazole   40 mg Oral Daily   thiamine   100 mg Oral Daily   vancomycin   125 mg Oral QID   alteplase , docusate sodium , heparin , ipratropium-albuterol , mouth rinse, polyethylene glycol  Assessment/ Plan:  [name] is a 68 y.o.  male  with past medical conditions including hypertension, COPD, obstructive sleep apnea with CPAP, anemia, and chronic kidney disease stage IV, who was admitted to [facility] on 05/15/2024 for Shock circulatory (HCC) [R57.9] Generalized weakness [R53.1] Hypotension, unspecified hypotension type [I95.9] Acute renal failure, unspecified acute renal failure type (HCC) [N17.9]   Acute kidney injury on chronic kidney disease stage IV. Baseline creatinine 2.77 with GFR 24 06/30/2024. Acute kidney injury likely secondary to hypotension and infectious process. Renal ultrasound negative for obstruction. No recent IV contrast exposure.  Patient has undergone 3 consecutive days of dialysis treatments.  Creatinine down to 4.65.  Urine output only 200 cc over the preceding 24 hours.  Hold off on additional dialysis today.  Reevaluate  for dialysis treatment again tomorrow.  Lab Results  Component Value Date   CREATININE 4.65 (H) 05/19/2024   CREATININE 5.73 (H)  05/18/2024   CREATININE 7.87 (H) 05/17/2024    Intake/Output Summary (Last 24 hours) at 05/19/2024 0919 Last data filed at 05/19/2024 0800 Gross per 24 hour  Intake 1803.74 ml  Output 1600 ml  Net 203.74 ml   2.  Acute metabolic acidosis, serum bicarbonate improved to 26.  3. Anemia of chronic kidney disease Lab Results  Component Value Date   HGB 8.9 (L) 05/19/2024  Hemoglobin slightly lower today at 8.9.  Consider Epogen  if he requires ongoing dialysis treatments.  4.  Hypotension likely sepsis related, source unknown at this time.  Patient off pressors at the moment.  5. Secondary Hyperparathyroidism: with outpatient labs: PTH 52.7, phosphorus 4.1, calcium  8.6 on 7/2.  Lab Results  Component Value Date   CALCIUM  7.7 (L) 05/19/2024   CAION 1.21 08/08/2023   PHOS 4.5 05/18/2024    Phosphorus was checked yesterday and was 4.5 and within acceptable limits.   LOS: 4 Kevin Gross 9/21/20259:19 AM

## 2024-05-19 NOTE — Assessment & Plan Note (Signed)
 Holding home aspirin  and Plavix  as patient is being started on Eliquis . - Continue with statin

## 2024-05-19 NOTE — Progress Notes (Signed)
 Order received from provider to place transfer order for med-surg with cardiac monitoring.

## 2024-05-19 NOTE — Progress Notes (Signed)
 PHARMACY CONSULT NOTE - ELECTROLYTES  Pharmacy Consult for Electrolyte Monitoring and Replacement   Recent Labs: Height: 5' 9 (175.3 cm) Weight: 115 kg (253 lb 8.5 oz) IBW/kg (Calculated) : 70.7 Estimated Creatinine Clearance: 19.5 mL/min (A) (by C-G formula based on SCr of 4.65 mg/dL (H)). Potassium (mmol/L)  Date Value  05/19/2024 3.6   Magnesium  (mg/dL)  Date Value  90/78/7974 1.8   Calcium  (mg/dL)  Date Value  90/78/7974 7.7 (L)   Albumin  (g/dL)  Date Value  90/80/7974 3.2 (L)   Phosphorus (mg/dL)  Date Value  90/79/7974 4.5   Sodium (mmol/L)  Date Value  05/19/2024 136  10/12/2021 145 (H)   Corrected Ca: 8 mg/dL  Assessment  Kevin Gross is a 68 y.o. male presenting with circulatory shock and AKI. PMH significant for gout, HTN, HLD. COPD, HFpEF, hepatic cirrhosis, CKD stage IV, PAD, alcohol abuse, atrial fibrillation not on AC. Pharmacy has been consulted to monitor and replace electrolytes.  Positive for C.diff. Toxin negative.   Diet: Thin  MIVF: albumin  25% @ 60 mL/hr q6h - completed.  Pertinent medications:   Goal of Therapy: Electrolytes WNL  Plan:  Mg 2 g IV x 1 F/u with AM labs.   Thank you for allowing pharmacy to be a part of this patient's care.   Cathaleen Blanch, PharmD, BCPS [facility]  05/19/2024 9:33 AM

## 2024-05-19 NOTE — Progress Notes (Signed)
 Report given to receiving nurse for patient, patient is being transferred to room 202 in hospital bed on cardiac monitoring, in stable condition, and with all belongings.

## 2024-05-19 NOTE — Assessment & Plan Note (Signed)
-   Continue statin and Zetia 

## 2024-05-19 NOTE — Assessment & Plan Note (Signed)
 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 No significant upper respiratory symptoms but patient was started on antibiotics. - Will complete a 5-day course of ceftriaxone  and Zithromax 

## 2024-05-19 NOTE — Assessment & Plan Note (Signed)
 History of underlying CKD stage IV. Most likely now ESRD. Patient with significant AKI, multifactorial with volume depletion and hypotension.  Renal ultrasound was negative for any obstruction.  Nephrology was consulted and patient was started on hemodialysis, received 3 sessions.  UOP of 200 in 24-hour recorded. - Nephrology is on board -They will determine whether to continue dialysis tomorrow or they can give him a break with a close outpatient follow-up. - If we need to continue dialysis then will need outpatient dialysis chair before discharge

## 2024-05-19 NOTE — Consult Note (Signed)
 PHARMACY - ANTICOAGULATION CONSULT NOTE  Pharmacy Consult for Heparin   Indication: portal vein thrombosis  Allergies  Allergen Reactions   Jardiance [Empagliflozin]     Loss of balance, hypoglycemic     Patient Measurements: Height: 5' 9 (175.3 cm) Weight: 115 kg (253 lb 8.5 oz) IBW/kg (Calculated) : 70.7 HEPARIN  DW (KG): 96.5  Vital Signs: Temp: 98.2 F (36.8 C) (09/21 0000) Temp Source: Oral (09/21 0000) BP: 105/53 (09/21 0600) Pulse Rate: 50 (09/21 0600)  Labs: Recent Labs    05/16/24 1658 05/16/24 2056 05/17/24 0544 05/18/24 0447 05/18/24 1056 05/18/24 1643 05/19/24 0435  HGB  --    < > 9.1* 9.1*  --   --  8.9*  HCT  --   --  27.2* 27.8*  --   --  27.6*  PLT  --   --  123* 108*  --   --  104*  APTT 43*  --   --   --  50*  --  >200*  LABPROT 17.8*  --   --   --  18.7*  --  19.4*  INR 1.4*  --   --   --  1.5*  --  1.6*  HEPARINUNFRC  --   --   --   --   --  0.29* 0.24*  CREATININE  --    < > 7.87* 5.73*  --   --  4.65*   < > = values in this interval not displayed.    Estimated Creatinine Clearance: 19.5 mL/min (A) (by C-G formula based on SCr of 4.65 mg/dL (H)).   Medical History: Past Medical History:  Diagnosis Date   Alcoholic cirrhosis of liver (HCC)    Anemia 2024   Cataract 2024   CHF (congestive heart failure) (HCC)    Chronic gouty arthritis    CKD (chronic kidney disease), stage III (HCC)    Clotting disorder (HCC)    Bilateral legs-pt states plaque build up in both legs requiring intervention   Complication of anesthesia    pt states that years ago during colonoscopy at [facility] they had to abort the procedure due to some cardiac issue had to see cardiologist   COPD (chronic obstructive pulmonary disease) (HCC)    Hyperlipidemia    Hypertension    Hyperuricemia    Peripheral arterial disease (HCC)    Pre-diabetes    Prostate cancer (HCC) 2023   Sleep apnea    uses cpap    Medications:  Medications Prior to Admission  Medication Sig  Dispense Refill Last Dose/Taking   allopurinol  (ZYLOPRIM ) 100 MG tablet Take 150 mg by mouth daily.   05/15/2024 Morning   amiodarone  (PACERONE ) 200 MG tablet Take 200 mg by mouth 2 (two) times daily.   05/15/2024 Morning   amoxicillin-clavulanate (AUGMENTIN) 500-125 MG tablet Take 1 tablet by mouth 2 (two) times daily.   05/15/2024 Morning   aspirin  EC 81 MG tablet Take 81 mg by mouth every morning.   05/15/2024 Morning   chlorthalidone  (HYGROTON ) 25 MG tablet Take 25 mg by mouth daily.   05/15/2024 Morning   Cholecalciferol  (VITAMIN D3) 25 MCG (1000 UT) CAPS Take 2,000 Units by mouth daily.   05/15/2024 Morning   clopidogrel  (PLAVIX ) 75 MG tablet Take 1 tablet (75 mg total) by mouth daily. 30 tablet 11 05/15/2024 Morning   ezetimibe  (ZETIA ) 10 MG tablet Take 1 tablet (10 mg total) by mouth daily. 90 tablet 3 05/15/2024 Morning   iron  polysaccharides (NIFEREX) 150 MG capsule  Take 1 capsule (150 mg total) by mouth 2 (two) times daily. 180 capsule 0 05/15/2024 Morning   lisinopril  (ZESTRIL ) 30 MG tablet Take 30 mg by mouth daily.   05/15/2024 Morning   simvastatin  (ZOCOR ) 20 MG tablet Take 20 mg by mouth in the morning.   05/15/2024 Morning   triamcinolone  ointment (KENALOG ) 0.1 % Apply 1 Application topically 2 (two) times daily as needed (psoriasis).   Unknown   zolpidem  (AMBIEN ) 10 MG tablet Take 10 mg by mouth at bedtime.   05/14/2024 Evening   pantoprazole  (PROTONIX ) 40 MG tablet Take 40 mg by mouth daily.   Unknown   Scheduled:   Chlorhexidine  Gluconate Cloth  6 each Topical Q0600   feeding supplement  237 mL Oral TID BM   melatonin  5 mg Oral QHS   midodrine   10 mg Oral TID WC   multivitamin  1 tablet Oral QHS   multivitamin with minerals  1 tablet Oral Daily   pantoprazole   40 mg Oral Daily   thiamine   100 mg Oral Daily   vancomycin   125 mg Oral QID   Infusions:   azithromycin  Stopped (05/18/24 1306)   cefTRIAXone  (ROCEPHIN )  IV Stopped (05/18/24 2304)   heparin  1,650 Units/hr (05/19/24  0600)   norepinephrine  (LEVOPHED ) Adult infusion Stopped (05/18/24 0549)   octreotide  (SANDOSTATIN ) 500 mcg in sodium chloride  0.9 % 250 mL (2 mcg/mL) infusion 50 mcg/hr (05/19/24 0600)   PRN: alteplase , docusate sodium , heparin , ipratropium-albuterol , mouth rinse, polyethylene glycol Anti-infectives (From admission, onward)    Start     Dose/Rate Route Frequency Ordered Stop   05/17/24 1945  vancomycin  (VANCOCIN ) capsule 125 mg        125 mg Oral 4 times daily 05/17/24 1850 05/27/24 1759   05/16/24 2200  cefTRIAXone  (ROCEPHIN ) 2 g in sodium chloride  0.9 % 100 mL IVPB        2 g 200 mL/hr over 30 Minutes Intravenous Every 24 hours 05/16/24 0705     05/16/24 1200  azithromycin  (ZITHROMAX ) 500 mg in sodium chloride  0.9 % 250 mL IVPB        500 mg 250 mL/hr over 60 Minutes Intravenous Every 24 hours 05/16/24 1029     05/16/24 0030  cefTRIAXone  (ROCEPHIN ) 2 g in sodium chloride  0.9 % 100 mL IVPB        2 g 200 mL/hr over 30 Minutes Intravenous  Once 05/15/24 2333 05/16/24 0022       Assessment: 68 year old male with history of ascites presenting with weakness after recent diarrheal illness. He is found to be in circulatory shock with decompensated liver cirrhosis and acute renal failure. He is admitted to the ICU for vasopressor support and is currently initiated on renal replacement therapy. Pharmacy consulted to start heparin  for concern for portal vein thrombosis. CBC overall stable, but plt trending down. No DOAC PTA.   Goal of Therapy:  Heparin  level 0.3-0.7 units/ml Monitor platelets by anticoagulation protocol: Yes   Baseline Labs 0920: aPTT 50, INR 1.5  Date Time HL Rate/Comment  0920 1643 0.29 Subtherpeatic @ 1500 units/hr 0921 0435 aPTT > 200 supratherapeutic / HL 0.24, not correlating  Plan:  aPTT supratherapeutic. Called RN, hold infusion for 1 hr Restart heparin  infusion at 1350 units/hr Recheck aPTT in 8 hrs after restart, will continue to follow aPTT until  correlation with HL is confirmed HL & CBC daily while on heparin   Rankin CANDIE Dills, PharmD, [facility] 05/19/2024 6:24 AM

## 2024-05-19 NOTE — Assessment & Plan Note (Signed)
 Multifactorial likely with volume depletion with gastroenteritis, underlying liver cirrhosis with concern of decompensation. Sepsis was considered but there was no obvious source of infection so sepsis ruled out. Initially required pressors, currently blood pressure within lower goal with midodrine . - Continue to monitor -Continue with midodrine 

## 2024-05-19 NOTE — Assessment & Plan Note (Signed)
 Patient was on lisinopril  and chlorthalidone  at home which are currently on hold due to softer blood pressure and patient is on midodrine . - Continue with midodrine  -Monitor blood pressure

## 2024-05-19 NOTE — Assessment & Plan Note (Signed)
 Concern of decompensation, s/p paracentesis with removal of 4.8 L of fluid, cultures negative for any SBP. GI was consulted and they added octreotide  infusion for 72-hour. -Avoid constipation-need to use lactulose to titrate for 2-3 soft bowel movements -Outpatient GI follow-up

## 2024-05-19 NOTE — Assessment & Plan Note (Signed)
 Patient was found to have portal vein thrombosis.  No indication for any intervention.  Patient was started on heparin  infusion and GI is recommending 6 months of anticoagulation and repeat imaging in 63-month to assess the response. - Switching heparin  with Eliquis  - Discontinuing home aspirin  and Plavix  to decrease the risk of bleeding

## 2024-05-19 NOTE — Assessment & Plan Note (Addendum)
 Patient had diarrhea for the past 2 days before admission, has been resolved now.  C. difficile with positive antigen, negative PCR and toxin so less likely but he he was started on p.o. vancomycin  because of the risk and being very sick. GI pathogen panel negative. - Continue with p.o. vancomycin -likely can stop a day after completing antibiotics for concern of pneumonia

## 2024-05-20 ENCOUNTER — Ambulatory Visit (HOSPITAL_COMMUNITY)

## 2024-05-20 ENCOUNTER — Encounter (HOSPITAL_COMMUNITY)

## 2024-05-20 ENCOUNTER — Other Ambulatory Visit (HOSPITAL_COMMUNITY): Payer: Self-pay

## 2024-05-20 ENCOUNTER — Telehealth (HOSPITAL_COMMUNITY): Payer: Self-pay | Admitting: Pharmacy Technician

## 2024-05-20 ENCOUNTER — Ambulatory Visit

## 2024-05-20 DIAGNOSIS — N179 Acute kidney failure, unspecified: Secondary | ICD-10-CM | POA: Diagnosis not present

## 2024-05-20 DIAGNOSIS — Z7902 Long term (current) use of antithrombotics/antiplatelets: Secondary | ICD-10-CM

## 2024-05-20 DIAGNOSIS — K7031 Alcoholic cirrhosis of liver with ascites: Secondary | ICD-10-CM | POA: Diagnosis not present

## 2024-05-20 DIAGNOSIS — Z79899 Other long term (current) drug therapy: Secondary | ICD-10-CM

## 2024-05-20 DIAGNOSIS — N185 Chronic kidney disease, stage 5: Secondary | ICD-10-CM

## 2024-05-20 DIAGNOSIS — R531 Weakness: Secondary | ICD-10-CM

## 2024-05-20 DIAGNOSIS — Z7982 Long term (current) use of aspirin: Secondary | ICD-10-CM

## 2024-05-20 DIAGNOSIS — J189 Pneumonia, unspecified organism: Secondary | ICD-10-CM | POA: Diagnosis not present

## 2024-05-20 DIAGNOSIS — Z87891 Personal history of nicotine dependence: Secondary | ICD-10-CM

## 2024-05-20 DIAGNOSIS — Z992 Dependence on renal dialysis: Secondary | ICD-10-CM

## 2024-05-20 DIAGNOSIS — R579 Shock, unspecified: Secondary | ICD-10-CM | POA: Diagnosis not present

## 2024-05-20 LAB — BASIC METABOLIC PANEL WITH GFR
Anion gap: 11 (ref 5–15)
BUN: 58 mg/dL — ABNORMAL HIGH (ref 8–23)
CO2: 26 mmol/L (ref 22–32)
Calcium: 7.8 mg/dL — ABNORMAL LOW (ref 8.9–10.3)
Chloride: 102 mmol/L (ref 98–111)
Creatinine, Ser: 5.32 mg/dL — ABNORMAL HIGH (ref 0.61–1.24)
GFR, Estimated: 11 mL/min — ABNORMAL LOW (ref >60)
Glucose, Bld: 114 mg/dL — ABNORMAL HIGH (ref 70–99)
Potassium: 3.8 mmol/L (ref 3.5–5.1)
Sodium: 139 mmol/L (ref 135–145)

## 2024-05-20 LAB — CBC
HCT: 31 % — ABNORMAL LOW (ref 39.0–52.0)
Hemoglobin: 10.1 g/dL — ABNORMAL LOW (ref 13.0–17.0)
MCH: 32.8 pg (ref 26.0–34.0)
MCHC: 32.6 g/dL (ref 30.0–36.0)
MCV: 100.6 fL — ABNORMAL HIGH (ref 80.0–100.0)
Platelets: 114 K/uL — ABNORMAL LOW (ref 150–400)
RBC: 3.08 MIL/uL — ABNORMAL LOW (ref 4.22–5.81)
RDW: 23 % — ABNORMAL HIGH (ref 11.5–15.5)
WBC: 10.4 K/uL (ref 4.0–10.5)
nRBC: 0.2 % (ref 0.0–0.2)

## 2024-05-20 LAB — BODY FLUID CULTURE W GRAM STAIN: Culture: NO GROWTH

## 2024-05-20 LAB — PROTIME-INR
INR: 2 — ABNORMAL HIGH (ref 0.8–1.2)
Prothrombin Time: 23.7 s — ABNORMAL HIGH (ref 11.4–15.2)

## 2024-05-20 LAB — MAGNESIUM: Magnesium: 1.7 mg/dL (ref 1.7–2.4)

## 2024-05-20 LAB — APTT: aPTT: 52 s — ABNORMAL HIGH (ref 24–36)

## 2024-05-20 MED ORDER — MAGNESIUM SULFATE 2 GM/50ML IV SOLN
2.0000 g | Freq: Once | INTRAVENOUS | Status: AC
  Administered 2024-05-20: 2 g via INTRAVENOUS
  Filled 2024-05-20: qty 50

## 2024-05-20 NOTE — Progress Notes (Signed)
 Requested to see pt for outpt HD needs at d/c.  Met with pt bedside.  Introduced self and explained role. Discussed HD options. If prefers [facility] .  Referral submitted to [facility]  for review.  Suzen Satchel Dialysis Navigator [phone]
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
## 2024-05-20 NOTE — Assessment & Plan Note (Signed)
 Multifactorial likely with volume depletion with gastroenteritis, underlying liver cirrhosis with concern of decompensation. Sepsis was considered but there was no obvious source of infection so sepsis ruled out. Initially required pressors, currently blood pressure within lower goal with midodrine . - Continue to monitor -Continue with midodrine 

## 2024-05-20 NOTE — Assessment & Plan Note (Signed)
 Patient had diarrhea for the past 2 days before admission, has been resolved now.  C. difficile with positive antigen, negative PCR and toxin so less likely but he he was started on p.o. vancomycin  because of the risk and being very sick. GI pathogen panel negative. - Will stop p.o. vancomycin  today

## 2024-05-20 NOTE — Progress Notes (Signed)
 Progress Note   Patient: Kevin Gross FMW:[id number] DOB: 1955/10/01 DOA: 05/15/2024     5 DOS: the patient was seen and examined on 05/20/2024   Brief hospital course: PCCM transfer for 05/19/2024  Taken from prior notes.  Kevin Gross is a 68 y.o. male with past medical history of hypertension, hyperlipidemia, CHF, CKD, iron  deficiency anemia, PAD, alcohol abuse, cirrhosis who presents to the ED complaining of shortness of breath, generalized weakness, nausea vomiting and diarrhea until a day prior to the admission, admitted on 9/18.  Patient continued to drink fluid but otherwise poor p.o. intake.  Patient reported abstinence from alcohol and tobacco, no recreational drug use.  No NSAID use  On presentation patient was alert and responsive, chronic bradycardia, hypotensive which did not responded well to IV fluid so started on pressors and admitted in ICU with concern of circulatory shock.  On admission patient was found to have acute metabolic acidosis with bicarb of 14, AKI with underlying CKD stage IV-patient became  oliguric and was started on dialysis during current hospitalization. Metabolic acidosis improved.  Patient was initially thought to have septic shock, all cultures which include blood and acetic fluid remain negative, sepsis ruled out.  Likely has circulatory shock secondary to his decompensated liver cirrhosis.  Significant ascites s/p paracentesis with removal of 4.8L-cultures negative. MELD sodium score of 26 on 05/18/2024.    Liver ultrasound with portal vein thrombosis, received vitamin K  and started on heparin  infusion.  Stool studies for C. difficile with positive antigen only, negative PCR and toxin-as patient was sick decided to start on p.o. vancomycin .  Patient was also started on ceftriaxone  and Zithromax  for concern of pneumonia-no radiologic evidence and unable to get Legionella/strep urinary antigen as he was not making urine.  GI was consulted for  decompensated liver cirrhosis and portal thrombosis, recommending continue anticoagulation for at least 6 months with cross-sectional imaging every 3 months to assess the response.  Started on octreotide  for 72-hour Patient already scheduled for outpatient colonoscopy on 05/27/2024 and no further GI workup needed at this time so they signed off.  Patient was transitioned off from vasopressors.  Received 3 days of dialysis and care is being transferred to [facility].  9/21: Vital stable with borderline soft blood pressure, labs with INR of 1.6, hemoglobin of 8.9, mild leukocytosis at 10.6, creatinine improved to 4.65 with BUN of 47.  9/22: Vital stable with some increase in creatinine, neurology ordered permanent HD catheter placement-likely be done tomorrow.  Patient will need outpatient dialysis chair before discharge. PT is recommending home health  Assessment and Plan: * Shock circulatory (HCC) Multifactorial likely with volume depletion with gastroenteritis, underlying liver cirrhosis with concern of decompensation. Sepsis was considered but there was no obvious source of infection so sepsis ruled out. Initially required pressors, currently blood pressure within lower goal with midodrine . - Continue to monitor -Continue with midodrine   Acute renal failure History of underlying CKD stage IV. Most likely now ESRD. Patient with significant AKI, multifactorial with volume depletion and hypotension.  Renal ultrasound was negative for any obstruction.  Nephrology was consulted and patient was started on hemodialysis, received 3 sessions. Permanent HD catheter placement was requested by nephrology-likely be done tomorrow - Nephrology is on board -He needs outpatient dialysis chair before discharge  Community acquired pneumonia of right lower lobe of lung Concern of right lower lobe pleural effusion which can be hydrothorax secondary to cirrhosis versus underlying pneumonia. No significant upper  respiratory symptoms but patient  was started on antibiotics. - Completed the course of ceftriaxone  and Zithromax   Alcoholic cirrhosis of liver with ascites (HCC) Concern of decompensation, s/p paracentesis with removal of 4.8 L of fluid, cultures negative for any SBP. GI was consulted and they added octreotide  infusion for 72-hour. -Avoid constipation-need to use lactulose to titrate for 2-3 soft bowel movements -Outpatient GI follow-up  Portal vein thrombosis Patient was found to have portal vein thrombosis.  No indication for any intervention.  Patient was started on heparin  infusion and GI is recommending 6 months of anticoagulation and repeat imaging in 6-month to assess the response. - Heparin  was switched with Eliquis  - Discontinuing home aspirin  and Plavix  to decrease the risk of bleeding  Hypertension Patient was on lisinopril  and chlorthalidone  at home which are currently on hold due to softer blood pressure and patient is on midodrine . - Continue with midodrine  -Monitor blood pressure  Peripheral arterial disease Holding home aspirin  and Plavix  as patient is being started on Eliquis . - Continue with statin  Diarrhea Patient had diarrhea for the past 2 days before admission, has been resolved now.  C. difficile with positive antigen, negative PCR and toxin so less likely but he he was started on p.o. vancomycin  because of the risk and being very sick. GI pathogen panel negative. - Continue with p.o. vancomycin -likely can stop a day after completing antibiotics for concern of pneumonia  Anemia Patient with history of iron  deficiency anemia.  Hemoglobin currently stable and no sign of bleeding. - Continue with iron  supplement -Monitor hemoglobin closely  Hyperlipidemia - Continue statin and Zetia       Subjective: Patient was sitting comfortably in chair when seen today.  No new concern.  He was aware about HD catheter placement and likely becoming ESRD.  Physical  Exam: Vitals:   05/19/24 1942 05/20/24 0344 05/20/24 0500 05/20/24 0815  BP: 106/62 121/64  122/60  Pulse: (!) 52 (!) 52  (!) 53  Resp: 16 16  18   Temp: 98.3 F (36.8 C) 98 F (36.7 C)  98 F (36.7 C)  TempSrc:    Oral  SpO2: 93% 94%  97%  Weight:   113.4 kg   Height:       General.  Obese gentleman, in no acute distress. Pulmonary.  Lungs clear bilaterally, normal respiratory effort. CV.  Regular rate and rhythm, no JVD, rub or murmur. Abdomen.  Soft, nontender, nondistended, BS positive. CNS.  Alert and oriented .  No focal neurologic deficit. Extremities.  1+ upper extremity edema Psychiatry.  Judgment and insight appears normal.    Data Reviewed: Prior data reviewed  Family Communication: Discussed with patient  Disposition: Status is: Inpatient Remains inpatient appropriate because: Severity of illness.  Planned Discharge Destination: Home with Home Health  DVT prophylaxis.  Eliquis  Time spent: 50 minutes  This record has been created using Conservation officer, historic buildings. Errors have been sought and corrected,but may not always be located. Such creation errors do not reflect on the standard of care.   Author: Amaryllis Dare, MD 05/20/2024 3:49 PM  For on call review [email or web address].

## 2024-05-20 NOTE — Consult Note (Signed)
 Hospital Consult    Reason for Consult:  Needs Dialysis [facility] Cath Placement  Requesting Physician:  Faith Harris NP  MRN #:  [id number]  History of Present Illness: This is a 68 y.o. male with a past medical history including hypertension, COPD, obstructive sleep apnea with CPAP, anemia, and chronic kidney disease stage IV who was admitted to [facility] on 05/15/2024 for circulatory shock and generalized weakness with hypotension.  At that time he was admitted to ICU with acute renal failure on top of chronic renal failure.  Patient's baseline creatinine was 2.77 with a GFR of 24 with acute kidney injury likely secondary to hypotension and a possible infectious process.  Patient had a temporary dialysis catheter placed in his left groin and has now progressed to needing dialysis permacatheter for long-term outpatient hemodialysis.  Vascular surgery was consulted to place dialysis access.  Past Medical History:  Diagnosis Date   Alcoholic cirrhosis of liver (HCC)    Anemia 2024   Cataract 2024   CHF (congestive heart failure) (HCC)    Chronic gouty arthritis    CKD (chronic kidney disease), stage III (HCC)    Clotting disorder (HCC)    Bilateral legs-pt states plaque build up in both legs requiring intervention   Complication of anesthesia    pt states that years ago during colonoscopy at [facility] they had to abort the procedure due to some cardiac issue had to see cardiologist   COPD (chronic obstructive pulmonary disease) (HCC)    Hyperlipidemia    Hypertension    Hyperuricemia    Peripheral arterial disease (HCC)    Pre-diabetes    Prostate cancer (HCC) 2023   Sleep apnea    uses cpap    Past Surgical History:  Procedure Laterality Date   ABDOMINAL AORTOGRAM W/LOWER EXTREMITY N/A 10/20/2021   Procedure: ABDOMINAL AORTOGRAM W/LOWER EXTREMITY;  Surgeon: Darron Deatrice LABOR, MD;  [facility];  Service: Cardiovascular;  Laterality: N/A;   ABDOMINAL AORTOGRAM W/LOWER  EXTREMITY N/A 08/08/2023   Procedure: ABDOMINAL AORTOGRAM W/LOWER EXTREMITY;  Surgeon: Serene Gaile ORN, MD;  [facility]: [facility];  Service: Cardiovascular;  Laterality: N/A;   ALLOGRAFT APPLICATION Bilateral 12/22/2021   Procedure: ALLOGRAFT APPLICATION;  Surgeon: Serene Gaile ORN, MD;  Location: [facility] OR;  Service: Vascular;  Laterality: Bilateral;   ANGIOPLASTY Left 11/26/2021   Procedure: LEFT INFRARENAL LITHOTRIPSY;  Surgeon: Serene Gaile ORN, MD;  Location: [facility] OR;  Service: Vascular;  Laterality: Left;   APPLICATION OF WOUND VAC Bilateral 12/13/2021   Procedure: APPLICATION OF WOUND VAC;  Surgeon: Lanis Fonda BRAVO, MD;  Location: [facility] OR;  Service: Vascular;  Laterality: Bilateral;   CARDIAC CATHETERIZATION     [facility]   CATARACT EXTRACTION W/PHACO Left 04/04/2023   Procedure: CATARACT EXTRACTION PHACO AND INTRAOCULAR LENS PLACEMENT (IOC) LEFT 8.71 00:45.5;  Surgeon: Jaye Fallow, MD;  Location: [facility];  Service: Ophthalmology;  Laterality: Left;   CATARACT EXTRACTION W/PHACO Right 04/18/2023   Procedure: CATARACT EXTRACTION PHACO AND INTRAOCULAR LENS PLACEMENT (IOC) RIGHT 4.31 00:8.2;  Surgeon: Jaye Fallow, MD;  Location: [facility];  Service: Ophthalmology;  Laterality: Right;   COLONOSCOPY     COLONOSCOPY WITH PROPOFOL  N/A 01/17/2018   Procedure: COLONOSCOPY WITH PROPOFOL ;  Surgeon: Toledo, Ladell POUR, MD;  Location: [facility] ENDOSCOPY;  Service: Gastroenterology;  Laterality: N/A;   COLONOSCOPY WITH PROPOFOL  N/A 09/12/2022   Procedure: COLONOSCOPY WITH PROPOFOL ;  Surgeon: Maryruth Ole DASEN, MD;  Location: [facility]COPY;  Service: Endoscopy;  Laterality:  N/A;   COLONOSCOPY WITH PROPOFOL  N/A 09/05/2023   Procedure: COLONOSCOPY WITH PROPOFOL ;  Surgeon: Maryruth Ole DASEN, MD;  Location: [facility]COPY;  Service: Endoscopy;  Laterality: N/A;   ENDARTERECTOMY FEMORAL Bilateral 11/26/2021   Procedure: BILATERAL FEMORAL ENDARTERECTOMY WITH VEIN PATCH  ANGIOPLASTY;  Surgeon: Serene Gaile ORN, MD;  [facility];  Service: Vascular;  Laterality: Bilateral;   ENTEROSCOPY N/A 09/05/2023   Procedure: ENTEROSCOPY;  Surgeon: Maryruth Ole DASEN, MD;  Location: [facility];  Service: Endoscopy;  Laterality: N/A;   ESOPHAGOGASTRODUODENOSCOPY (EGD) WITH PROPOFOL  N/A 01/17/2018   Procedure: ESOPHAGOGASTRODUODENOSCOPY (EGD) WITH PROPOFOL ;  Surgeon: Toledo, Ladell POUR, MD;  Location: [facility];  Service: Gastroenterology;  Laterality: N/A;   ESOPHAGOGASTRODUODENOSCOPY (EGD) WITH PROPOFOL  N/A 09/12/2022   Procedure: ESOPHAGOGASTRODUODENOSCOPY (EGD) WITH PROPOFOL ;  Surgeon: Maryruth Ole DASEN, MD;  Location: [facility];  Service: Endoscopy;  Laterality: N/A;   EYE SURGERY     GROIN DEBRIDEMENT Bilateral 12/16/2021   Procedure: IRRIGATION AND DEBRIDEMENT OF BILATERAL GROINS;  Surgeon: Serene Gaile ORN, MD;  [facility];  Service: Vascular;  Laterality: Bilateral;   INCISION AND DRAINAGE OF WOUND Bilateral 12/13/2021   Procedure: IRRIGATION AND DEBRIDEMENT OF BILATERAL GROIN WOUNDS;  Surgeon: Lanis Fonda BRAVO, MD;  Location: [facility] OR;  Service: Vascular;  Laterality: Bilateral;   INSERTION OF ILIAC STENT Bilateral 11/26/2021   Procedure: BILATERAL ILIAC STENTING USING 3mmX80mm INNOVA STENT ON LEFT ILIAC AND 47mmX59mm, 64mmX79mm VBX AND 108mmX7.5cm VIABAHN STENT ON RIGHT ILIAC;  Surgeon: Serene Gaile ORN, MD;  [facility];  Service: Vascular;  Laterality: Bilateral;   INSERTION OF SEEDS IN PROSTATE     ORIF ANKLE FRACTURE Right    PERIPHERAL INTRAVASCULAR LITHOTRIPSY  08/08/2023   Procedure: PERIPHERAL INTRAVASCULAR LITHOTRIPSY;  Surgeon: Serene Gaile ORN, MD;  [facility];  Service: Cardiovascular;;   PROSTATE BIOPSY N/A 04/01/2021   Procedure: PROSTATE BIOPSY GRAYCE;  Surgeon: Kassie Ozell SAUNDERS, MD;  Location: [facility] ORS;  Service: Urology;  Laterality: N/A;   VEIN HARVEST Bilateral 11/26/2021   Procedure: VEIN HARVEST OF BILATERAL  SPAHENOUS VEINS;  Surgeon: Serene Gaile ORN, MD;  [facility];  Service: Vascular;  Laterality: Bilateral;   WOUND DEBRIDEMENT Bilateral 12/22/2021   Procedure: INCISION AND DEBRIDEMENT OF BILATERAL GROINS;  Surgeon: Serene Gaile ORN, MD;  [facility];  Service: Vascular;  Laterality: Bilateral;    Allergies  Allergen Reactions   Jardiance [Empagliflozin]     Loss of balance, hypoglycemic     Prior to Admission medications   Medication Sig Start Date End Date Taking? Authorizing Provider  allopurinol  (ZYLOPRIM ) 100 MG tablet Take 150 mg by mouth daily.   Yes [provider]  amiodarone  (PACERONE ) 200 MG tablet Take 200 mg by mouth 2 (two) times daily.   Yes [provider]  amoxicillin-clavulanate (AUGMENTIN) 500-125 MG tablet Take 1 tablet by mouth 2 (two) times daily. 05/06/24  Yes [provider]  aspirin  EC 81 MG tablet Take 81 mg by mouth every morning.   Yes [provider]  chlorthalidone  (HYGROTON ) 25 MG tablet Take 25 mg by mouth daily.   Yes [provider]  Cholecalciferol  (VITAMIN D3) 25 MCG (1000 UT) CAPS Take 2,000 Units by mouth daily.   Yes [provider]  clopidogrel  (PLAVIX ) 75 MG tablet Take 1 tablet (75 mg total) by mouth daily. 01/16/23  Yes Baglia, Corrina, PA-C  ezetimibe  (ZETIA ) 10 MG tablet Take 1 tablet (10 mg total) by mouth daily. 04/10/18  Yes  Gollan, Timothy J, MD  iron  polysaccharides (NIFEREX) 150 MG capsule Take 1 capsule (150 mg total) by mouth 2 (two) times daily. 03/11/24 06/09/24 Yes Sreenath, Sudheer B, MD  lisinopril  (ZESTRIL ) 30 MG tablet Take 30 mg by mouth daily.   Yes [provider]  simvastatin  (ZOCOR ) 20 MG tablet Take 20 mg by mouth in the morning.   Yes [provider]  triamcinolone  ointment (KENALOG ) 0.1 % Apply 1 Application topically 2 (two) times daily as needed (psoriasis). 07/18/23  Yes [provider]  zolpidem  (AMBIEN ) 10 MG tablet Take 10 mg by mouth  at bedtime.   Yes [provider]  pantoprazole  (PROTONIX ) 40 MG tablet Take 40 mg by mouth daily.    [provider]    Social History   Socioeconomic History   Marital status: Married    Spouse name: Adelle   Number of children: 2   Years of education: Not on file   Highest education level: Not on file  Occupational History   Occupation: disabled  Tobacco Use   Smoking status: Former    Current packs/day: 0.00    Average packs/day: 1 pack/day for 47.0 years (47.0 ttl pk-yrs)    Types: Cigarettes    Start date: 62    Quit date: 2016    Years since quitting: 9.7    Passive exposure: Never   Smokeless tobacco: Never  Vaping Use   Vaping status: Never Used  Substance and Sexual Activity   Alcohol use: Not Currently   Drug use: No   Sexual activity: Not Currently    Birth control/protection: Other-see comments    Comment: ED  Other Topics Concern   Not on file  Social History Narrative   Not on file   Social Drivers of Health   Financial Resource Strain: Low Risk  (07/17/2023)   Overall Financial Resource Strain (CARDIA)    Difficulty of Paying Living Expenses: Not hard at all  Food Insecurity: No Food Insecurity (05/15/2024)   Hunger Vital Sign    Worried About Running Out of Food in the Last Year: Never true    Ran Out of Food in the Last Year: Never true  Transportation Needs: No Transportation Needs (05/15/2024)   PRAPARE - Administrator, Civil Service (Medical): No    Lack of Transportation (Non-Medical): No  Physical Activity: Not on file  Stress: Not on file  Social Connections: Moderately Isolated (05/15/2024)   Social Connection and Isolation Panel    Frequency of Communication with Friends and Family: More than three times a week    Frequency of Social Gatherings with Friends and Family: More than three times a week    Attends Religious Services: Never    Database administrator or Organizations: No    Attends Tax inspector Meetings: Never    Marital Status: Married  Catering manager Violence: Not At Risk (05/15/2024)   Humiliation, Afraid, Rape, and Kick questionnaire    Fear of Current or Ex-Partner: No    Emotionally Abused: No    Physically Abused: No    Sexually Abused: No     Family History  Problem Relation Age of Onset   Heart disease Mother    Heart disease Brother     ROS: Otherwise negative unless mentioned in HPI  Physical Examination  Vitals:   05/20/24 0344 05/20/24 0815  BP: 121/64 122/60  Pulse: (!) 52 (!) 53  Resp: 16 18  Temp: 98 F (36.7  C) 98 F (36.7 C)  SpO2: 94% 97%   Body mass index is 36.92 kg/m.  General:  WDWN in NAD Gait: Not observed HENT: WNL, normocephalic Pulmonary: normal non-labored breathing, without Rales, rhonchi,  wheezing Cardiac: regular, without  Murmurs, rubs or gallops; without carotid bruits Abdomen: Positive bowel sounds throughout, soft, NT/ND, no masses Skin: without rashes Vascular Exam/Pulses: Palpable pulses throughout. +1 upper extremity edema with +2 lower extremity edema.  Extremities: without ischemic changes, without Gangrene , without cellulitis; without open wounds;  Musculoskeletal: no muscle wasting or atrophy  Neurologic: A&O X 3;  No focal weakness or paresthesias are detected; speech is fluent/normal Psychiatric:  The pt has Normal affect. Lymph:  Unremarkable  CBC    Component Value Date/Time   WBC 10.4 05/20/2024 0313   RBC 3.08 (L) 05/20/2024 0313   HGB 10.1 (L) 05/20/2024 0313   HGB 13.3 10/12/2021 1314   HCT 31.0 (L) 05/20/2024 0313   HCT 39.4 10/12/2021 1314   PLT 114 (L) 05/20/2024 0313   PLT 175 10/12/2021 1314   MCV 100.6 (H) 05/20/2024 0313   MCV 97 10/12/2021 1314   MCH 32.8 05/20/2024 0313   MCHC 32.6 05/20/2024 0313   RDW 23.0 (H) 05/20/2024 0313   RDW 13.1 10/12/2021 1314   LYMPHSABS 0.7 04/15/2024 1328   LYMPHSABS 1.9 10/12/2021 1314   MONOABS 0.6 04/15/2024 1328   EOSABS 0.1  04/15/2024 1328   EOSABS 0.7 (H) 10/12/2021 1314   BASOSABS 0.0 04/15/2024 1328   BASOSABS 0.1 10/12/2021 1314    BMET    Component Value Date/Time   NA 139 05/20/2024 0313   NA 145 (H) 10/12/2021 1314   K 3.8 05/20/2024 0313   CL 102 05/20/2024 0313   CO2 26 05/20/2024 0313   GLUCOSE 114 (H) 05/20/2024 0313   BUN 58 (H) 05/20/2024 0313   BUN 37 (H) 10/12/2021 1314   CREATININE 5.32 (H) 05/20/2024 0313   CALCIUM  7.8 (L) 05/20/2024 0313   GFRNONAA 11 (L) 05/20/2024 0313   GFRAA 56 (L) 10/26/2019 0551    COAGS: Lab Results  Component Value Date   INR 2.0 (H) 05/20/2024   INR 1.6 (H) 05/19/2024   INR 1.5 (H) 05/18/2024     Non-Invasive Vascular Imaging:   None Ordered  Statin:  Yes.   Beta Blocker:  Yes.   Aspirin :  Yes.   ACEI:  No. ARB:  No. CCB use:  No Other antiplatelets/anticoagulants:  Yes.   Plavix  75 mg Daily.    ASSESSMENT/PLAN: This is a 68 y.o. male who presented to [facility] emergency department with increasing shortness of breath.  Send known history of CHF and chronic kidney disease stage V.  Upon admission temporary dialysis catheter was placed in the patient's left groin and he has been receiving dialysis.  His BUN and creatinine are elevated with a GFR worsening and therefore will need dialysis permacatheter access for hemodialysis outpatient.  Therefore vascular surgery recommends dialysis permacatheter placement for hemodialysis outpatient.  I discussed in detail this afternoon with the patient at the bedside the procedure, benefits, risk, and complications.  Patient verbalized understanding wishes to proceed.  I answered all the patient's questions to afternoon.  Patient will remain n.p.o. after midnight tonight for procedure tomorrow.  Patient's Eliquis  was placed on hold tonight and tomorrow morning prior to procedure.   -I discussed the case in detail with Dr. Cordella Shawl MD and he agrees with plan.   Gwendlyn JONELLE [facility]ank [facility] 05/20/2024 11:28 AM

## 2024-05-20 NOTE — Progress Notes (Signed)
 PHARMACY CONSULT NOTE - ELECTROLYTES  Pharmacy Consult for Electrolyte Monitoring and Replacement   Recent Labs: Height: 5' 9 (175.3 cm) Weight: 113.4 kg (250 lb) IBW/kg (Calculated) : 70.7 Estimated Creatinine Clearance: 17 mL/min (A) (by C-G formula based on SCr of 5.32 mg/dL (H)). Potassium (mmol/L)  Date Value  05/20/2024 3.8   Magnesium  (mg/dL)  Date Value  90/77/7974 1.7   Calcium  (mg/dL)  Date Value  90/77/7974 7.8 (L)   Albumin  (g/dL)  Date Value  90/80/7974 3.2 (L)   Phosphorus (mg/dL)  Date Value  90/79/7974 4.5   Sodium (mmol/L)  Date Value  05/20/2024 139  10/12/2021 145 (H)   Corrected Ca: 8 mg/dL  Assessment  Kevin Gross is a 68 y.o. male presenting with circulatory shock and AKI. PMH significant for gout, HTN, HLD. COPD, HFpEF, hepatic cirrhosis, CKD stage IV, PAD, alcohol abuse, atrial fibrillation not on AC. Pharmacy has been consulted to monitor and replace electrolytes.  Positive for C.diff. Toxin negative.   Diet: Thin  MIVF: albumin  25% @ 60 mL/hr q6h - completed.  Pertinent medications:   Goal of Therapy: Electrolytes WNL  Plan:  Mg 1.7, give MagSulf 2g IV x 1 F/u electrolytes with AM labs  Thank you for allowing pharmacy to be a part of this patient's care.   Mose Blew, PharmD, BCPS [facility]  05/20/2024 7:07 AM

## 2024-05-20 NOTE — Telephone Encounter (Signed)
 Patient Product/process development scientist completed.    The patient is insured through [employer]. Patient has Medicare and is not eligible for a copay card, but may be able to apply for patient assistance or Medicare RX Payment Plan (Patient Must reach out to their plan, if eligible for payment plan), if available.    Ran test claim for Eliquis  Starter Pack and the current 30 day co-pay is $12.15.   This test claim was processed through [facility]- copay amounts may vary at other pharmacies due to pharmacy/plan contracts, or as the patient moves through the different stages of their insurance plan.     Reyes Sharps, CPHT Pharmacy Technician III Certified Patient Advocate [facility] Patient Advocate Team Direct Number: [phone]  Fax: [phone]

## 2024-05-20 NOTE — Progress Notes (Signed)
 [facility]  ROUNDING NOTE   Subjective:   Patient sitting up in bed Alert Denies pain Room air  09/21 0701 - 09/22 0700 In: 925.3 [I.V.:575.5; IV Piggyback:349.8] Out: 401 [Urine:400; Stool:1] Lab Results  Component Value Date   CREATININE 5.32 (H) 05/20/2024   CREATININE 4.65 (H) 05/19/2024   CREATININE 5.73 (H) 05/18/2024      Objective:  Vital signs in last 24 hours:  Temp:  [97.7 F (36.5 C)-98.9 F (37.2 C)] 98 F (36.7 C) (09/22 0815) Pulse Rate:  [46-53] 53 (09/22 0815) Resp:  [14-27] 18 (09/22 0815) BP: (81-122)/(51-64) 122/60 (09/22 0815) SpO2:  [93 %-97 %] 97 % (09/22 0815) Weight:  [113.4 kg] 113.4 kg (09/22 0500)  Weight change: -1.2 kg Filed Weights   05/18/24 1310 05/19/24 0459 05/20/24 0500  Weight: 114.6 kg 115 kg 113.4 kg    Intake/Output: I/O last 3 completed shifts: In: 1872.4 [P.O.:360; I.V.:1062.5; IV Piggyback:449.9] Out: 401 [Urine:400; Stool:1]   Intake/Output this shift:  Total I/O In: 240 [P.O.:240] Out: -   Physical Exam: General: NAD  Head: Normocephalic, atraumatic. Moist oral mucosal membranes  Eyes: Anicteric  Lungs:  Clear to auscultation, normal effort  Heart: Regular rate and rhythm  Abdomen:  Soft, nontender  Extremities: Trace-1+ peripheral edema.  Neurologic: Awake, alert, conversant  Skin: Warm,dry, no rash  Access: Lt femoral Trialysis cath    Basic Metabolic Panel: Recent Labs  Lab 05/16/24 0441 05/16/24 1128 05/16/24 2056 05/17/24 0544 05/18/24 0447 05/19/24 0435 05/20/24 0313  NA 143   < > 140 142 140 136 139  K 4.8   < > 4.3 4.1 3.8 3.6 3.8  CL 117*   < > 107 106 101 100 102  CO2 16*   < > 19* 20* 22 26 26   GLUCOSE 104*   < > 120* 121* 112* 110* 114*  BUN 115*   < > 86* 91* 64* 47* 58*  CREATININE 10.89*   < > 8.16* 7.87* 5.73* 4.65* 5.32*  CALCIUM  7.6*   < > 7.6* 7.8* 8.0* 7.7* 7.8*  MG 2.5*  --   --  2.0 1.9 1.8 1.7  PHOS 8.5*  --   --  6.3* 4.5  --   --    < > = values in  this interval not displayed.    Liver Function Tests: Recent Labs  Lab 05/15/24 1528 05/16/24 0441 05/17/24 0544  AST 98* 95* 95*  ALT 49* 47* 41  ALKPHOS 187* 183* 128*  BILITOT 1.2 1.3* 1.7*  PROT 6.1* 5.8* 5.8*  ALBUMIN  2.3* 2.2* 3.2*   No results for input(s): LIPASE, AMYLASE in the last 168 hours. Recent Labs  Lab 05/16/24 1128  AMMONIA 67*    CBC: Recent Labs  Lab 05/16/24 0441 05/17/24 0544 05/18/24 0447 05/19/24 0435 05/20/24 0313  WBC 18.2* 10.9* 9.4 10.6* 10.4  HGB 10.8* 9.1* 9.1* 8.9* 10.1*  HCT 33.8* 27.2* 27.8* 27.6* 31.0*  MCV 98.5 96.8 97.9 99.3 100.6*  PLT 211 123* 108* 104* 114*    Cardiac Enzymes: No results for input(s): CKTOTAL, CKMB, CKMBINDEX, TROPONINI in the last 168 hours.  BNP: Invalid input(s): POCBNP  CBG: Recent Labs  Lab 05/15/24 2047 05/16/24 1605  GLUCAP 80 120*    Microbiology: Results for orders placed or performed during the hospital encounter of 05/15/24  MRSA Next Gen by PCR, Nasal     Status: None   Collection Time: 05/15/24  8:57 PM   Specimen: Nasal Mucosa; Nasal Swab  Result Value Ref Range Status   MRSA by PCR Next Gen NOT DETECTED NOT DETECTED Final    Comment: (NOTE) The GeneXpert MRSA Assay (FDA approved for NASAL specimens only), is one component of a comprehensive MRSA colonization surveillance program. It is not intended to diagnose MRSA infection nor to guide or monitor treatment for MRSA infections. Test performance is not FDA approved in patients less than 43 years old. Performed at [facility], [address] [place], [place] [address]   SARS Coronavirus 2 by RT PCR (hospital order, performed in [facility] hospital lab) *cepheid single result test* Anterior Nasal Swab     Status: None   Collection Time: 05/15/24  8:58 PM   Specimen: Anterior Nasal Swab  Result Value Ref Range Status   SARS Coronavirus 2 by RT PCR NEGATIVE NEGATIVE Final    Comment: (NOTE) SARS-CoV-2  target nucleic acids are NOT DETECTED.  The SARS-CoV-2 RNA is generally detectable in upper and lower respiratory specimens during the acute phase of infection. The lowest concentration of SARS-CoV-2 viral copies this assay can detect is 250 copies / mL. A negative result does not preclude SARS-CoV-2 infection and should not be used as the sole basis for treatment or other patient management decisions.  A negative result may occur with improper specimen collection / handling, submission of specimen other than nasopharyngeal swab, presence of viral mutation(s) within the areas targeted by this assay, and inadequate number of viral copies (<250 copies / mL). A negative result must be combined with clinical observations, patient history, and epidemiological information.  Fact Sheet for Patients:   RoadLapTop.co.za  Fact Sheet for Healthcare Providers: http://kim-miller.com/  This test is not yet approved or  cleared by the [place]  FDA and has been authorized for detection and/or diagnosis of SARS-CoV-2 by FDA under an Emergency Use Authorization (EUA).  This EUA will remain in effect (meaning this test can be used) for the duration of the COVID-19 declaration under Section 564(b)(1) of the Act, 21 U.S.C. section 360bbb-3(b)(1), unless the authorization is terminated or revoked sooner.  Performed at [facility], [address] [place], [place] [address]   Body fluid culture w Gram Stain     Status: None   Collection Time: 05/16/24  2:50 PM   Specimen: PATH Cytology Peritoneal fluid  Result Value Ref Range Status   Specimen Description   Final    PERITONEAL Performed at [facility], [address] [place], [place] [address]    Special Requests   Final    NONE Performed at [facility], [address] [place], [place] [address]    Gram Stain   Final    WBC PRESENT, PREDOMINANTLY MONONUCLEAR NO  ORGANISMS SEEN CYTOSPIN SMEAR    Culture   Final    NO GROWTH 3 DAYS Performed at [facility], [address], [place], [place] [address]    Report Status 05/20/2024 FINAL  Final  Culture, blood (Routine X 2) w Reflex to ID Panel     Status: None (Preliminary result)   Collection Time: 05/16/24  6:15 PM   Specimen: BLOOD  Result Value Ref Range Status   Specimen Description BLOOD BLOOD RIGHT ARM  Final   Special Requests   Final    BOTTLES DRAWN AEROBIC AND ANAEROBIC Blood Culture adequate volume   Culture   Final    NO GROWTH 4 DAYS Performed at [facility], [address] [place], [place] [address]  Report Status PENDING  Incomplete  Culture, blood (Routine X 2) w Reflex to ID Panel     Status: None (Preliminary result)   Collection Time: 05/16/24  6:28 PM   Specimen: BLOOD  Result Value Ref Range Status   Specimen Description BLOOD BLOOD RIGHT ARM  Final   Special Requests   Final    BOTTLES DRAWN AEROBIC AND ANAEROBIC Blood Culture adequate volume   Culture   Final    NO GROWTH 4 DAYS Performed at [facility], [address] [place], [place] [address]    Report Status PENDING  Incomplete  Gastrointestinal Panel by PCR , Stool     Status: None   Collection Time: 05/17/24  3:20 AM   Specimen: Stool  Result Value Ref Range Status   Campylobacter species NOT DETECTED NOT DETECTED Final   Plesimonas shigelloides NOT DETECTED NOT DETECTED Final   Salmonella species NOT DETECTED NOT DETECTED Final   Yersinia enterocolitica NOT DETECTED NOT DETECTED Final   Vibrio species NOT DETECTED NOT DETECTED Final   Vibrio cholerae NOT DETECTED NOT DETECTED Final   Enteroaggregative E coli (EAEC) NOT DETECTED NOT DETECTED Final   Enteropathogenic E coli (EPEC) NOT DETECTED NOT DETECTED Final   Enterotoxigenic E coli (ETEC) NOT DETECTED NOT DETECTED Final   Shiga like toxin producing E coli (STEC) NOT DETECTED NOT DETECTED Final   Shigella/Enteroinvasive  E coli (EIEC) NOT DETECTED NOT DETECTED Final   Cryptosporidium NOT DETECTED NOT DETECTED Final   Cyclospora cayetanensis NOT DETECTED NOT DETECTED Final   Entamoeba histolytica NOT DETECTED NOT DETECTED Final   Giardia lamblia NOT DETECTED NOT DETECTED Final   Adenovirus F40/41 NOT DETECTED NOT DETECTED Final   Astrovirus NOT DETECTED NOT DETECTED Final   Norovirus GI/GII NOT DETECTED NOT DETECTED Final   Rotavirus A NOT DETECTED NOT DETECTED Final   Sapovirus (I, II, IV, and V) NOT DETECTED NOT DETECTED Final    Comment: Performed at [facility], [address] [place], [place] [address]  C Difficile Quick Screen w PCR reflex     Status: Abnormal   Collection Time: 05/17/24  4:34 PM   Specimen: STOOL  Result Value Ref Range Status   C Diff antigen POSITIVE (A) NEGATIVE Final   C Diff toxin NEGATIVE NEGATIVE Final   C Diff interpretation Results are indeterminate. See PCR results.  Final    Comment: Performed at [facility], [address] [place], [place] [address]  C. Diff by PCR, Reflexed     Status: None   Collection Time: 05/17/24  4:34 PM  Result Value Ref Range Status   Toxigenic C. Difficile by PCR NEGATIVE NEGATIVE Final    Comment: Patient is colonized with non toxigenic C. difficile. May not need treatment unless significant symptoms are present.   Hypervirulent Strain PRESUMPTIVE NEGATIVE PRESUMPTIVE NEGATIVE Final    Comment: Performed at [facility], [address] [place], [place] [address]    Coagulation Studies: Recent Labs    05/18/24 1056 05/19/24 0435 05/20/24 0313  LABPROT 18.7* 19.4* 23.7*  INR 1.5* 1.6* 2.0*    Urinalysis: No results for input(s): COLORURINE, LABSPEC, PHURINE, GLUCOSEU, HGBUR, BILIRUBINUR, KETONESUR, PROTEINUR, UROBILINOGEN, NITRITE, LEUKOCYTESUR in the last 72 hours.  Invalid input(s): APPERANCEUR     Imaging: No results found.    Medications:    azithromycin   Stopped (05/19/24 1320)   cefTRIAXone  (ROCEPHIN )  IV Stopped (05/19/24 2257)   octreotide  (SANDOSTATIN ) 500 mcg in sodium chloride  0.9 %  250 mL (2 mcg/mL) infusion 50 mcg/hr (05/20/24 9166)    apixaban   10 mg Oral BID   Followed by   NOREEN ON 05/26/2024] apixaban   5 mg Oral BID   Chlorhexidine  Gluconate Cloth  6 each Topical Q0600   ezetimibe   10 mg Oral Daily   feeding supplement  237 mL Oral TID BM   iron  polysaccharides  150 mg Oral BID   melatonin  5 mg Oral QHS   midodrine   10 mg Oral TID WC   multivitamin  1 tablet Oral QHS   multivitamin with minerals  1 tablet Oral Daily   pantoprazole   40 mg Oral Daily   simvastatin   20 mg Oral QHS   thiamine   100 mg Oral Daily   vancomycin   125 mg Oral QID   alteplase , docusate sodium , heparin , ipratropium-albuterol , mouth rinse, polyethylene glycol  Assessment/ Plan:  Kevin Gross is a 68 y.o.  male  with past medical conditions including hypertension, COPD, obstructive sleep apnea with CPAP, anemia, and chronic kidney disease stage IV, who was admitted to [facility] on 05/15/2024 for Shock circulatory (HCC) [R57.9] Generalized weakness [R53.1] Hypotension, unspecified hypotension type [I95.9] Acute renal failure, unspecified acute renal failure type (HCC) [N17.9]   Acute kidney injury on chronic kidney disease stage IV. Baseline creatinine 2.77 with GFR 24 06/30/2024. Acute kidney injury likely secondary to hypotension and infectious process. Renal ultrasound negative for obstruction. No recent IV contrast exposure.    Urine output remains decreased with elevated creatinine. Discussed with patient that he will likely require dialysis for a short time after discharge. He is agreeable. Will consult vascular for permcath placement. Next treatment scheduled for Tuesday. Dialysis coordinator aware of patient and will seek placement at [facility].   Lab Results  Component Value Date   CREATININE 5.32 (H) 05/20/2024   CREATININE 4.65 (H)  05/19/2024   CREATININE 5.73 (H) 05/18/2024    Intake/Output Summary (Last 24 hours) at 05/20/2024 1124 Last data filed at 05/20/2024 1000 Gross per 24 hour  Intake 1051.99 ml  Output 401 ml  Net 650.99 ml   2.  Acute metabolic acidosis, serum bicarbonate corrected  3. Anemia of chronic kidney disease Lab Results  Component Value Date   HGB 10.1 (L) 05/20/2024  Hemoglobin 10.1.  Consider Epogen  if needed.   4.  Hypotension likely sepsis related, source unknown at this time.  Patient off pressors. Blood pressure stable, remains on midodrine .  5. Secondary Hyperparathyroidism: with outpatient labs: PTH 52.7, phosphorus 4.1, calcium  8.6 on 7/2.  Lab Results  Component Value Date   CALCIUM  7.8 (L) 05/20/2024   CAION 1.21 08/08/2023   PHOS 4.5 05/18/2024    Will continue to monitor bone minerals.    LOS: 5 Kevin Gross 9/22/202511:24 AM
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
## 2024-05-20 NOTE — Assessment & Plan Note (Signed)
 History of underlying CKD stage IV. Most likely now ESRD. Patient with significant AKI, multifactorial with volume depletion and hypotension.  Renal ultrasound was negative for any obstruction.  Nephrology was consulted and patient was started on hemodialysis, received 3 sessions. Permanent HD catheter placement was requested by nephrology-likely be done tomorrow - Nephrology is on board -He needs outpatient dialysis chair before discharge

## 2024-05-20 NOTE — H&P (View-Only) (Signed)
 Hospital Consult    Reason for Consult:  Needs Dialysis [facility] Cath Placement  Requesting Physician:  Faith Harris NP  MRN #:  [id number]  History of Present Illness: This is a 68 y.o. male with a past medical history including hypertension, COPD, obstructive sleep apnea with CPAP, anemia, and chronic kidney disease stage IV who was admitted to [facility] on 05/15/2024 for circulatory shock and generalized weakness with hypotension.  At that time he was admitted to ICU with acute renal failure on top of chronic renal failure.  Patient's baseline creatinine was 2.77 with a GFR of 24 with acute kidney injury likely secondary to hypotension and a possible infectious process.  Patient had a temporary dialysis catheter placed in his left groin and has now progressed to needing dialysis permacatheter for long-term outpatient hemodialysis.  Vascular surgery was consulted to place dialysis access.  Past Medical History:  Diagnosis Date   Alcoholic cirrhosis of liver (HCC)    Anemia 2024   Cataract 2024   CHF (congestive heart failure) (HCC)    Chronic gouty arthritis    CKD (chronic kidney disease), stage III (HCC)    Clotting disorder (HCC)    Bilateral legs-pt states plaque build up in both legs requiring intervention   Complication of anesthesia    pt states that years ago during colonoscopy at [facility] they had to abort the procedure due to some cardiac issue had to see cardiologist   COPD (chronic obstructive pulmonary disease) (HCC)    Hyperlipidemia    Hypertension    Hyperuricemia    Peripheral arterial disease (HCC)    Pre-diabetes    Prostate cancer (HCC) 2023   Sleep apnea    uses cpap    Past Surgical History:  Procedure Laterality Date   ABDOMINAL AORTOGRAM W/LOWER EXTREMITY N/A 10/20/2021   Procedure: ABDOMINAL AORTOGRAM W/LOWER EXTREMITY;  Surgeon: Darron Deatrice LABOR, MD;  [facility];  Service: Cardiovascular;  Laterality: N/A;   ABDOMINAL AORTOGRAM W/LOWER  EXTREMITY N/A 08/08/2023   Procedure: ABDOMINAL AORTOGRAM W/LOWER EXTREMITY;  Surgeon: Serene Gaile ORN, MD;  [facility]: [facility];  Service: Cardiovascular;  Laterality: N/A;   ALLOGRAFT APPLICATION Bilateral 12/22/2021   Procedure: ALLOGRAFT APPLICATION;  Surgeon: Serene Gaile ORN, MD;  Location: [facility] OR;  Service: Vascular;  Laterality: Bilateral;   ANGIOPLASTY Left 11/26/2021   Procedure: LEFT INFRARENAL LITHOTRIPSY;  Surgeon: Serene Gaile ORN, MD;  Location: [facility] OR;  Service: Vascular;  Laterality: Left;   APPLICATION OF WOUND VAC Bilateral 12/13/2021   Procedure: APPLICATION OF WOUND VAC;  Surgeon: Lanis Fonda BRAVO, MD;  Location: [facility] OR;  Service: Vascular;  Laterality: Bilateral;   CARDIAC CATHETERIZATION     [facility]   CATARACT EXTRACTION W/PHACO Left 04/04/2023   Procedure: CATARACT EXTRACTION PHACO AND INTRAOCULAR LENS PLACEMENT (IOC) LEFT 8.71 00:45.5;  Surgeon: Jaye Fallow, MD;  Location: [facility];  Service: Ophthalmology;  Laterality: Left;   CATARACT EXTRACTION W/PHACO Right 04/18/2023   Procedure: CATARACT EXTRACTION PHACO AND INTRAOCULAR LENS PLACEMENT (IOC) RIGHT 4.31 00:8.2;  Surgeon: Jaye Fallow, MD;  Location: [facility];  Service: Ophthalmology;  Laterality: Right;   COLONOSCOPY     COLONOSCOPY WITH PROPOFOL  N/A 01/17/2018   Procedure: COLONOSCOPY WITH PROPOFOL ;  Surgeon: Toledo, Ladell POUR, MD;  Location: [facility] ENDOSCOPY;  Service: Gastroenterology;  Laterality: N/A;   COLONOSCOPY WITH PROPOFOL  N/A 09/12/2022   Procedure: COLONOSCOPY WITH PROPOFOL ;  Surgeon: Maryruth Ole DASEN, MD;  Location: [facility]COPY;  Service: Endoscopy;  Laterality:  N/A;   COLONOSCOPY WITH PROPOFOL  N/A 09/05/2023   Procedure: COLONOSCOPY WITH PROPOFOL ;  Surgeon: Maryruth Ole DASEN, MD;  Location: [facility] END[facility]COPY;  Service: Endoscopy;  Laterality: N/A;   ENDARTERECTOMY FEMORAL Bilateral 11/26/2021   Procedure: BILATERAL FEMORAL ENDARTERECTOMY WITH VEIN PATCH  ANGIOPLASTY;  Surgeon: Serene Gaile ORN, MD;  [facility];  Service: Vascular;  Laterality: Bilateral;   ENTEROSCOPY N/A 09/05/2023   Procedure: ENTEROSCOPY;  Surgeon: Maryruth Ole DASEN, MD;  Location: [facility]COPY;  Service: Endoscopy;  Laterality: N/A;   ESOPHAGOGASTRODUODENOSCOPY (EGD) WITH PROPOFOL  N/A 01/17/2018   Procedure: ESOPHAGOGASTRODUODENOSCOPY (EGD) WITH PROPOFOL ;  Surgeon: Toledo, Ladell POUR, MD;  Location: [facility];  Service: Gastroenterology;  Laterality: N/A;   ESOPHAGOGASTRODUODENOSCOPY (EGD) WITH PROPOFOL  N/A 09/12/2022   Procedure: ESOPHAGOGASTRODUODENOSCOPY (EGD) WITH PROPOFOL ;  Surgeon: Maryruth Ole DASEN, MD;  Location: [facility];  Service: Endoscopy;  Laterality: N/A;   EYE SURGERY     GROIN DEBRIDEMENT Bilateral 12/16/2021   Procedure: IRRIGATION AND DEBRIDEMENT OF BILATERAL GROINS;  Surgeon: Serene Gaile ORN, MD;  [facility];  Service: Vascular;  Laterality: Bilateral;   INCISION AND DRAINAGE OF WOUND Bilateral 12/13/2021   Procedure: IRRIGATION AND DEBRIDEMENT OF BILATERAL GROIN WOUNDS;  Surgeon: Lanis Fonda BRAVO, MD;  Location: [facility] OR;  Service: Vascular;  Laterality: Bilateral;   INSERTION OF ILIAC STENT Bilateral 11/26/2021   Procedure: BILATERAL ILIAC STENTING USING 3mmX80mm INNOVA STENT ON LEFT ILIAC AND 47mmX59mm, 64mmX79mm VBX AND 108mmX7.5cm VIABAHN STENT ON RIGHT ILIAC;  Surgeon: Serene Gaile ORN, MD;  [facility];  Service: Vascular;  Laterality: Bilateral;   INSERTION OF SEEDS IN PROSTATE     ORIF ANKLE FRACTURE Right    PERIPHERAL INTRAVASCULAR LITHOTRIPSY  08/08/2023   Procedure: PERIPHERAL INTRAVASCULAR LITHOTRIPSY;  Surgeon: Serene Gaile ORN, MD;  [facility];  Service: Cardiovascular;;   PROSTATE BIOPSY N/A 04/01/2021   Procedure: PROSTATE BIOPSY GRAYCE;  Surgeon: Kassie Ozell SAUNDERS, MD;  Location: [facility] ORS;  Service: Urology;  Laterality: N/A;   VEIN HARVEST Bilateral 11/26/2021   Procedure: VEIN HARVEST OF BILATERAL  SPAHENOUS VEINS;  Surgeon: Serene Gaile ORN, MD;  [facility];  Service: Vascular;  Laterality: Bilateral;   WOUND DEBRIDEMENT Bilateral 12/22/2021   Procedure: INCISION AND DEBRIDEMENT OF BILATERAL GROINS;  Surgeon: Serene Gaile ORN, MD;  [facility];  Service: Vascular;  Laterality: Bilateral;    Allergies  Allergen Reactions   Jardiance [Empagliflozin]     Loss of balance, hypoglycemic     Prior to Admission medications   Medication Sig Start Date End Date Taking? Authorizing Provider  allopurinol  (ZYLOPRIM ) 100 MG tablet Take 150 mg by mouth daily.   Yes [provider]  amiodarone  (PACERONE ) 200 MG tablet Take 200 mg by mouth 2 (two) times daily.   Yes [provider]  amoxicillin-clavulanate (AUGMENTIN) 500-125 MG tablet Take 1 tablet by mouth 2 (two) times daily. 05/06/24  Yes [provider]  aspirin  EC 81 MG tablet Take 81 mg by mouth every morning.   Yes [provider]  chlorthalidone  (HYGROTON ) 25 MG tablet Take 25 mg by mouth daily.   Yes [provider]  Cholecalciferol  (VITAMIN D3) 25 MCG (1000 UT) CAPS Take 2,000 Units by mouth daily.   Yes [provider]  clopidogrel  (PLAVIX ) 75 MG tablet Take 1 tablet (75 mg total) by mouth daily. 01/16/23  Yes Baglia, Corrina, PA-C  ezetimibe  (ZETIA ) 10 MG tablet Take 1 tablet (10 mg total) by mouth daily. 04/10/18  Yes  Gollan, Timothy J, MD  iron  polysaccharides (NIFEREX) 150 MG capsule Take 1 capsule (150 mg total) by mouth 2 (two) times daily. 03/11/24 06/09/24 Yes Sreenath, Sudheer B, MD  lisinopril  (ZESTRIL ) 30 MG tablet Take 30 mg by mouth daily.   Yes [provider]  simvastatin  (ZOCOR ) 20 MG tablet Take 20 mg by mouth in the morning.   Yes [provider]  triamcinolone  ointment (KENALOG ) 0.1 % Apply 1 Application topically 2 (two) times daily as needed (psoriasis). 07/18/23  Yes [provider]  zolpidem  (AMBIEN ) 10 MG tablet Take 10 mg by mouth  at bedtime.   Yes [provider]  pantoprazole  (PROTONIX ) 40 MG tablet Take 40 mg by mouth daily.    [provider]    Social History   Socioeconomic History   Marital status: Married    Spouse name: Adelle   Number of children: 2   Years of education: Not on file   Highest education level: Not on file  Occupational History   Occupation: disabled  Tobacco Use   Smoking status: Former    Current packs/day: 0.00    Average packs/day: 1 pack/day for 47.0 years (47.0 ttl pk-yrs)    Types: Cigarettes    Start date: 62    Quit date: 2016    Years since quitting: 9.7    Passive exposure: Never   Smokeless tobacco: Never  Vaping Use   Vaping status: Never Used  Substance and Sexual Activity   Alcohol use: Not Currently   Drug use: No   Sexual activity: Not Currently    Birth control/protection: Other-see comments    Comment: ED  Other Topics Concern   Not on file  Social History Narrative   Not on file   Social Drivers of Health   Financial Resource Strain: Low Risk  (07/17/2023)   Overall Financial Resource Strain (CARDIA)    Difficulty of Paying Living Expenses: Not hard at all  Food Insecurity: No Food Insecurity (05/15/2024)   Hunger Vital Sign    Worried About Running Out of Food in the Last Year: Never true    Ran Out of Food in the Last Year: Never true  Transportation Needs: No Transportation Needs (05/15/2024)   PRAPARE - Administrator, Civil Service (Medical): No    Lack of Transportation (Non-Medical): No  Physical Activity: Not on file  Stress: Not on file  Social Connections: Moderately Isolated (05/15/2024)   Social Connection and Isolation Panel    Frequency of Communication with Friends and Family: More than three times a week    Frequency of Social Gatherings with Friends and Family: More than three times a week    Attends Religious Services: Never    Database administrator or Organizations: No    Attends Tax inspector Meetings: Never    Marital Status: Married  Catering manager Violence: Not At Risk (05/15/2024)   Humiliation, Afraid, Rape, and Kick questionnaire    Fear of Current or Ex-Partner: No    Emotionally Abused: No    Physically Abused: No    Sexually Abused: No     Family History  Problem Relation Age of Onset   Heart disease Mother    Heart disease Brother     ROS: Otherwise negative unless mentioned in HPI  Physical Examination  Vitals:   05/20/24 0344 05/20/24 0815  BP: 121/64 122/60  Pulse: (!) 52 (!) 53  Resp: 16 18  Temp: 98 F (36.7  C) 98 F (36.7 C)  SpO2: 94% 97%   Body mass index is 36.92 kg/m.  General:  WDWN in NAD Gait: Not observed HENT: WNL, normocephalic Pulmonary: normal non-labored breathing, without Rales, rhonchi,  wheezing Cardiac: regular, without  Murmurs, rubs or gallops; without carotid bruits Abdomen: Positive bowel sounds throughout, soft, NT/ND, no masses Skin: without rashes Vascular Exam/Pulses: Palpable pulses throughout. +1 upper extremity edema with +2 lower extremity edema.  Extremities: without ischemic changes, without Gangrene , without cellulitis; without open wounds;  Musculoskeletal: no muscle wasting or atrophy  Neurologic: A&O X 3;  No focal weakness or paresthesias are detected; speech is fluent/normal Psychiatric:  The pt has Normal affect. Lymph:  Unremarkable  CBC    Component Value Date/Time   WBC 10.4 05/20/2024 0313   RBC 3.08 (L) 05/20/2024 0313   HGB 10.1 (L) 05/20/2024 0313   HGB 13.3 10/12/2021 1314   HCT 31.0 (L) 05/20/2024 0313   HCT 39.4 10/12/2021 1314   PLT 114 (L) 05/20/2024 0313   PLT 175 10/12/2021 1314   MCV 100.6 (H) 05/20/2024 0313   MCV 97 10/12/2021 1314   MCH 32.8 05/20/2024 0313   MCHC 32.6 05/20/2024 0313   RDW 23.0 (H) 05/20/2024 0313   RDW 13.1 10/12/2021 1314   LYMPHSABS 0.7 04/15/2024 1328   LYMPHSABS 1.9 10/12/2021 1314   MONOABS 0.6 04/15/2024 1328   EOSABS 0.1  04/15/2024 1328   EOSABS 0.7 (H) 10/12/2021 1314   BASOSABS 0.0 04/15/2024 1328   BASOSABS 0.1 10/12/2021 1314    BMET    Component Value Date/Time   NA 139 05/20/2024 0313   NA 145 (H) 10/12/2021 1314   K 3.8 05/20/2024 0313   CL 102 05/20/2024 0313   CO2 26 05/20/2024 0313   GLUCOSE 114 (H) 05/20/2024 0313   BUN 58 (H) 05/20/2024 0313   BUN 37 (H) 10/12/2021 1314   CREATININE 5.32 (H) 05/20/2024 0313   CALCIUM  7.8 (L) 05/20/2024 0313   GFRNONAA 11 (L) 05/20/2024 0313   GFRAA 56 (L) 10/26/2019 0551    COAGS: Lab Results  Component Value Date   INR 2.0 (H) 05/20/2024   INR 1.6 (H) 05/19/2024   INR 1.5 (H) 05/18/2024     Non-Invasive Vascular Imaging:   None Ordered  Statin:  Yes.   Beta Blocker:  Yes.   Aspirin :  Yes.   ACEI:  No. ARB:  No. CCB use:  No Other antiplatelets/anticoagulants:  Yes.   Plavix  75 mg Daily.    ASSESSMENT/PLAN: This is a 68 y.o. male who presented to [facility] emergency department with increasing shortness of breath.  Send known history of CHF and chronic kidney disease stage V.  Upon admission temporary dialysis catheter was placed in the patient's left groin and he has been receiving dialysis.  His BUN and creatinine are elevated with a GFR worsening and therefore will need dialysis permacatheter access for hemodialysis outpatient.  Therefore vascular surgery recommends dialysis permacatheter placement for hemodialysis outpatient.  I discussed in detail this afternoon with the patient at the bedside the procedure, benefits, risk, and complications.  Patient verbalized understanding wishes to proceed.  I answered all the patient's questions to afternoon.  Patient will remain n.p.o. after midnight tonight for procedure tomorrow.  Patient's Eliquis  was placed on hold tonight and tomorrow morning prior to procedure.   -I discussed the case in detail with Dr. Cordella Shawl MD and he agrees with plan.   Gwendlyn JONELLE [facility]ank [facility] 05/20/2024 11:28 AM

## 2024-05-20 NOTE — Assessment & Plan Note (Signed)
 Patient was found to have portal vein thrombosis.  No indication for any intervention.  Patient was started on heparin  infusion and GI is recommending 6 months of anticoagulation and repeat imaging in 45-month to assess the response. - Heparin  was switched with Eliquis  - Discontinuing home aspirin  and Plavix  to decrease the risk of bleeding

## 2024-05-20 NOTE — Assessment & Plan Note (Signed)
 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 No significant upper respiratory symptoms but patient was started on antibiotics. - Completed the course of ceftriaxone  and Zithromax 

## 2024-05-21 ENCOUNTER — Encounter
Admission: EM | Disposition: A | Discharge status: Home Health | Payer: Self-pay | Source: Ambulatory Visit | Attending: Student in an Organized Health Care Education/Training Program

## 2024-05-21 DIAGNOSIS — J189 Pneumonia, unspecified organism: Secondary | ICD-10-CM | POA: Diagnosis not present

## 2024-05-21 DIAGNOSIS — R579 Shock, unspecified: Secondary | ICD-10-CM | POA: Diagnosis not present

## 2024-05-21 DIAGNOSIS — N189 Chronic kidney disease, unspecified: Secondary | ICD-10-CM

## 2024-05-21 DIAGNOSIS — N179 Acute kidney failure, unspecified: Secondary | ICD-10-CM | POA: Diagnosis not present

## 2024-05-21 DIAGNOSIS — K7031 Alcoholic cirrhosis of liver with ascites: Secondary | ICD-10-CM | POA: Diagnosis not present

## 2024-05-21 HISTORY — PX: DIALYSIS/PERMA CATHETER INSERTION: CATH118288

## 2024-05-21 LAB — CBC
HCT: 32.7 % — ABNORMAL LOW (ref 39.0–52.0)
Hemoglobin: 10.6 g/dL — ABNORMAL LOW (ref 13.0–17.0)
MCH: 32.7 pg (ref 26.0–34.0)
MCHC: 32.4 g/dL (ref 30.0–36.0)
MCV: 100.9 fL — ABNORMAL HIGH (ref 80.0–100.0)
Platelets: 146 K/uL — ABNORMAL LOW (ref 150–400)
RBC: 3.24 MIL/uL — ABNORMAL LOW (ref 4.22–5.81)
RDW: 22.8 % — ABNORMAL HIGH (ref 11.5–15.5)
WBC: 15.5 K/uL — ABNORMAL HIGH (ref 4.0–10.5)
nRBC: 0.1 % (ref 0.0–0.2)

## 2024-05-21 LAB — CULTURE, BLOOD (ROUTINE X 2)
Culture: NO GROWTH
Culture: NO GROWTH
Special Requests: ADEQUATE
Special Requests: ADEQUATE

## 2024-05-21 LAB — COMPREHENSIVE METABOLIC PANEL WITH GFR
ALT: 31 U/L (ref 0–44)
AST: 59 U/L — ABNORMAL HIGH (ref 15–41)
Albumin: 2.4 g/dL — ABNORMAL LOW (ref 3.5–5.0)
Alkaline Phosphatase: 97 U/L (ref 38–126)
Anion gap: 12 (ref 5–15)
BUN: 74 mg/dL — ABNORMAL HIGH (ref 8–23)
CO2: 22 mmol/L (ref 22–32)
Calcium: 7.9 mg/dL — ABNORMAL LOW (ref 8.9–10.3)
Chloride: 101 mmol/L (ref 98–111)
Creatinine, Ser: 6.09 mg/dL — ABNORMAL HIGH (ref 0.61–1.24)
GFR, Estimated: 9 mL/min — ABNORMAL LOW (ref >60)
Glucose, Bld: 120 mg/dL — ABNORMAL HIGH (ref 70–99)
Potassium: 3.4 mmol/L — ABNORMAL LOW (ref 3.5–5.1)
Sodium: 135 mmol/L (ref 135–145)
Total Bilirubin: 1.3 mg/dL — ABNORMAL HIGH (ref 0.0–1.2)
Total Protein: 5.2 g/dL — ABNORMAL LOW (ref 6.5–8.1)

## 2024-05-21 LAB — PROTIME-INR
INR: 2.5 — ABNORMAL HIGH (ref 0.8–1.2)
Prothrombin Time: 28.1 s — ABNORMAL HIGH (ref 11.4–15.2)

## 2024-05-21 LAB — APTT: aPTT: 54 s — ABNORMAL HIGH (ref 24–36)

## 2024-05-21 LAB — MAGNESIUM: Magnesium: 2.2 mg/dL (ref 1.7–2.4)

## 2024-05-21 LAB — HEPATITIS B CORE ANTIBODY, TOTAL: HEP B CORE AB: NEGATIVE

## 2024-05-21 SURGERY — DIALYSIS/PERMA CATHETER INSERTION
Anesthesia: Moderate Sedation | Laterality: Right

## 2024-05-21 MED ORDER — MIDAZOLAM HCL 5 MG/5ML IJ SOLN
INTRAMUSCULAR | Status: AC
  Filled 2024-05-21: qty 5

## 2024-05-21 MED ORDER — MIDODRINE HCL 5 MG PO TABS
10.0000 mg | ORAL_TABLET | Freq: Once | ORAL | Status: AC
  Administered 2024-05-21: 10 mg via ORAL
  Filled 2024-05-21: qty 2

## 2024-05-21 MED ORDER — APIXABAN 5 MG PO TABS
5.0000 mg | ORAL_TABLET | Freq: Two times a day (BID) | ORAL | Status: DC
Start: 2024-05-28 — End: 2024-05-25
  Filled 2024-05-21: qty 1

## 2024-05-21 MED ORDER — MIDAZOLAM HCL 2 MG/2ML IJ SOLN
INTRAMUSCULAR | Status: DC | PRN
  Administered 2024-05-21: 1 mg via INTRAVENOUS

## 2024-05-21 MED ORDER — APIXABAN 5 MG PO TABS
10.0000 mg | ORAL_TABLET | Freq: Two times a day (BID) | ORAL | Status: DC
  Administered 2024-05-22 – 2024-05-24 (×5): 10 mg via ORAL
  Filled 2024-05-21 (×6): qty 2

## 2024-05-21 MED ORDER — SODIUM CHLORIDE 0.9 % IV BOLUS
200.0000 mL | INTRAVENOUS | Status: AC
  Administered 2024-05-21: 200 mL via INTRAVENOUS

## 2024-05-21 MED ORDER — APIXABAN 5 MG PO TABS
10.0000 mg | ORAL_TABLET | Freq: Two times a day (BID) | ORAL | Status: DC
Start: 2024-05-21 — End: 2024-05-21
  Filled 2024-05-21: qty 2

## 2024-05-21 MED ORDER — HEPARIN (PORCINE) IN NACL 1000-0.9 UT/500ML-% IV SOLN
INTRAVENOUS | Status: DC | PRN
  Administered 2024-05-21: 500 mL

## 2024-05-21 MED ORDER — LIDOCAINE-EPINEPHRINE (PF) 1 %-1:200000 IJ SOLN
INTRAMUSCULAR | Status: DC | PRN
  Administered 2024-05-21: 20 mL

## 2024-05-21 MED ORDER — HEPARIN SODIUM (PORCINE) 10000 UNIT/ML IJ SOLN
INTRAMUSCULAR | Status: DC | PRN
  Administered 2024-05-21: 10000 [IU]

## 2024-05-21 MED ORDER — HEPARIN SODIUM (PORCINE) 10000 UNIT/ML IJ SOLN
INTRAMUSCULAR | Status: AC
  Filled 2024-05-21: qty 1

## 2024-05-21 MED ORDER — CEFAZOLIN SODIUM-DEXTROSE 1-4 GM/50ML-% IV SOLN
INTRAVENOUS | Status: AC
  Filled 2024-05-21: qty 50

## 2024-05-21 MED ORDER — SODIUM CHLORIDE 0.9 % IV SOLN: INTRAVENOUS | Status: DC

## 2024-05-21 MED ORDER — MIDODRINE HCL 5 MG PO TABS
ORAL_TABLET | ORAL | Status: AC
  Filled 2024-05-21: qty 2

## 2024-05-21 MED ORDER — CEFAZOLIN SODIUM-DEXTROSE 1-4 GM/50ML-% IV SOLN
1.0000 g | INTRAVENOUS | Status: AC
  Administered 2024-05-21: 1 g via INTRAVENOUS
  Filled 2024-05-21: qty 50

## 2024-05-21 MED ORDER — APIXABAN 5 MG PO TABS: 10.0000 mg | ORAL_TABLET | Freq: Two times a day (BID) | ORAL | Status: DC

## 2024-05-21 MED ORDER — APIXABAN 5 MG PO TABS
10.0000 mg | ORAL_TABLET | Freq: Two times a day (BID) | ORAL | Status: DC
  Filled 2024-05-21: qty 2

## 2024-05-21 MED ORDER — LACTATED RINGERS IV BOLUS
500.0000 mL | Freq: Once | INTRAVENOUS | Status: AC
  Administered 2024-05-21: 500 mL via INTRAVENOUS

## 2024-05-21 MED ORDER — FENTANYL CITRATE (PF) 100 MCG/2ML IJ SOLN
INTRAMUSCULAR | Status: DC | PRN
  Administered 2024-05-21: 25 ug via INTRAVENOUS

## 2024-05-21 MED ORDER — HEPARIN SODIUM (PORCINE) 1000 UNIT/ML IJ SOLN
INTRAMUSCULAR | Status: AC
  Filled 2024-05-21: qty 3

## 2024-05-21 MED ORDER — FENTANYL CITRATE (PF) 100 MCG/2ML IJ SOLN
INTRAMUSCULAR | Status: AC
  Filled 2024-05-21: qty 2

## 2024-05-21 MED ORDER — LIDOCAINE-PRILOCAINE 2.5-2.5 % EX CREA: 1.0000 | TOPICAL_CREAM | CUTANEOUS | Status: DC | PRN

## 2024-05-21 MED ORDER — APIXABAN 5 MG PO TABS
5.0000 mg | ORAL_TABLET | Freq: Two times a day (BID) | ORAL | Status: DC
  Filled 2024-05-21: qty 1

## 2024-05-21 MED ORDER — EPOETIN ALFA-EPBX 10000 UNIT/ML IJ SOLN
INTRAMUSCULAR | Status: AC
  Filled 2024-05-21: qty 1

## 2024-05-21 MED ORDER — PENTAFLUOROPROP-TETRAFLUOROETH EX AERO: 1.0000 | INHALATION_SPRAY | CUTANEOUS | Status: DC | PRN

## 2024-05-21 MED ORDER — APIXABAN 5 MG PO TABS
5.0000 mg | ORAL_TABLET | Freq: Two times a day (BID) | ORAL | Status: DC
Start: 2024-05-27 — End: 2024-05-21
  Filled 2024-05-21: qty 1

## 2024-05-21 MED ORDER — APIXABAN 5 MG PO TABS: 5.0000 mg | ORAL_TABLET | Freq: Two times a day (BID) | ORAL | Status: DC

## 2024-05-21 SURGICAL SUPPLY — 12 items
BIOPATCH RED 1 DISK 7.0 (GAUZE/BANDAGES/DRESSINGS) IMPLANT
CATH HEMO 15FR 19 SMART SEAL (HEMODIALYSIS SUPPLIES) IMPLANT
COVER PROBE ULTRASOUND 5X96 (MISCELLANEOUS) IMPLANT
DERMABOND ADVANCED .7 DNX12 (GAUZE/BANDAGES/DRESSINGS) IMPLANT
DRAPE INCISE IOBAN 66X45 STRL (DRAPES) IMPLANT
GOWN STRL REUS W/ TWL LRG LVL3 (GOWN DISPOSABLE) ×1 IMPLANT
NDL ENTRY 21GA 7CM ECHOTIP (NEEDLE) IMPLANT
NEEDLE ENTRY 21GA 7CM ECHOTIP (NEEDLE) ×1 IMPLANT
PACK ANGIOGRAPHY (CUSTOM PROCEDURE TRAY) ×1 IMPLANT
SET INTRO CAPELLA COAXIAL (SET/KITS/TRAYS/PACK) IMPLANT
SUT MNCRL AB 4-0 PS2 18 (SUTURE) IMPLANT
SUT SILK 0 FSL (SUTURE) IMPLANT

## 2024-05-21 NOTE — TOC Initial Note (Signed)
 Transition of Care [facility]) - Initial/Assessment Note    Patient Details  Name: Kevin Gross MRN: [id number] Date of Birth: Dec 20, 1955  Transition of Care [facility]) CM/SW Contact:    Corean ONEIDA Haddock, RN Phone Number: 05/21/2024, 10:42 AM  Clinical Narrative:                  Patient currently off the unit for HD, and plan for perm cath  placement today  Per Chart review Admitted for: AKI Admitted from: home with wife and children PCP: Autry Current home health/prior home health/DME: RW, cane, rollator   Therapy recommending HH and BSC.  CM to follow up with patient when he is available     Barriers to Discharge: Continued Medical Work up   Patient Goals and CMS Choice            Expected Discharge Plan and Services                                              Prior Living Arrangements/Services   Lives with:: Spouse, Adult Children (Also lives with daughters boyfriend.) Patient language and need for interpreter reviewed:: Yes        Need for Family Participation in Patient Care: Yes (Comment)     Criminal Activity/Legal Involvement Pertinent to Current Situation/Hospitalization: No - Comment as needed  Activities of Daily Living   ADL Screening (condition at time of admission) Independently performs ADLs?: Yes (appropriate for developmental age) Is the patient deaf or have difficulty hearing?: No Does the patient have difficulty seeing, even when wearing glasses/contacts?: No Does the patient have difficulty concentrating, remembering, or making decisions?: No  Permission Sought/Granted                  Emotional Assessment Appearance:: Appears stated age Attitude/Demeanor/Rapport: Engaged, Gracious Affect (typically observed): Calm, Pleasant Orientation: : Oriented to Self, Oriented to Place, Oriented to  Time, Oriented to Situation Alcohol / Substance Use: Not Applicable Psych Involvement: No (comment)  Admission diagnosis:  Shock  circulatory (HCC) [R57.9] Generalized weakness [R53.1] Hypotension, unspecified hypotension type [I95.9] Acute renal failure, unspecified acute renal failure type [N17.9] Patient Active Problem List   Diagnosis Date Noted   Generalized weakness 05/20/2024   Diarrhea 05/19/2024   Portal vein thrombosis 05/19/2024   Community acquired pneumonia of right lower lobe of lung 05/17/2024   Shock circulatory (HCC) 05/15/2024   ABLA (acute blood loss anemia) 03/08/2024   Acute on chronic diastolic CHF (congestive heart failure) (HCC) 03/08/2024   Hyperlipidemia 03/08/2024   Paroxysmal atrial fibrillation (HCC) 03/08/2024   Iron  deficiency anemia 07/17/2023   Symptomatic anemia 09/09/2022   Anemia 12/19/2021   Alcoholic cirrhosis of liver with ascites (HCC) 12/13/2021   Pre-diabetes 12/13/2021   OSA (obstructive sleep apnea) 12/13/2021   Hypertension 12/13/2021   Wound infection after surgery 12/12/2021   PAF (paroxysmal atrial fibrillation) (HCC)    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 11/26/2021   Peripheral arterial disease 11/26/2021   Chronic gouty arthritis 04/16/2019   Encounter for long-term (current) use of high-risk medication 04/16/2019   Stage 3b chronic kidney disease (CKD) (HCC) 03/05/2019   Hyperuricemia 03/05/2019   Cellulitis of left lower extremity 02/18/2019   Aortic atherosclerosis 04/10/2018   Class 3 obesity 04/10/2018   CAD (coronary artery disease), native coronary artery 04/08/2018   COPD (chronic  obstructive pulmonary disease) (HCC) 04/08/2018   Acute renal failure 04/08/2018   Hx of adenomatous colonic polyps 08/29/2016   PCP:  Autry Grayce LABOR, PA Pharmacy:   [facility] - [place], [place] - [address] [place] [address] Phone: [phone] Fax: [phone]     Social Drivers of Health (SDOH) Social History: SDOH Screenings   Food Insecurity: No Food Insecurity  (05/15/2024)  Housing: Low Risk  (05/15/2024)  Transportation Needs: No Transportation Needs (05/15/2024)  Utilities: Not At Risk (05/15/2024)  Depression (PHQ2-9): Low Risk  (03/14/2024)  Financial Resource Strain: Low Risk  (07/17/2023)  Social Connections: Moderately Isolated (05/15/2024)  Tobacco Use: Medium Risk (05/18/2024)   SDOH Interventions:     Readmission Risk Interventions    05/16/2024    1:46 PM 09/13/2022   11:09 AM 12/24/2021   12:03 PM  Readmission Risk Prevention Plan  Transportation Screening Complete Complete Complete  PCP or Specialist Appt within 5-7 Days   Complete  PCP or Specialist Appt within 3-5 Days Complete    Home Care Screening  Patient refused Complete  Medication Review (RN CM)  Complete Complete  Palliative Care Screening Not Applicable    Medication Review (RN Care Manager) Complete

## 2024-05-21 NOTE — Assessment & Plan Note (Signed)
 Patient was on lisinopril  and chlorthalidone  at home which are currently on hold due to softer blood pressure and patient is on midodrine . - Continue with midodrine  -Monitor blood pressure

## 2024-05-21 NOTE — Op Note (Signed)
 OPERATIVE NOTE   PROCEDURE: Insertion of tunneled dialysis catheter right IJ approach with ultrasound and fluoroscopic guidance.  PRE-OPERATIVE DIAGNOSIS: Acute on chronic renal insufficiency   POST-OPERATIVE DIAGNOSIS: Same  SURGEON: Cordella Shawl.  ANESTHESIA: Conscious sedation was administered under my direct supervision by the interventional radiology RN. IV Versed  plus fentanyl  were utilized. Continuous ECG, pulse oximetry and blood pressure was monitored throughout the entire procedure. Conscious sedation was for a total of 35 minutes.  ESTIMATED BLOOD LOSS: Minimal cc  CONTRAST USED:  None  FLUOROSCOPY TIME: 0.5 minutes  INDICATIONS:   Kevin Gross a 68 y.o. y.o. male who presents with acute on chronic renal insufficiency.  He underwent placement of a temporary dialysis catheter so that he could initiate dialysis and stabilize his hemodynamics and electrolytes.  At this time he is now undergoing placement of a tunneled dialysis catheter for continued hemodialysis therapy.  Risks and benefits of been reviewed all questions answered patient agrees to proceed  DESCRIPTION: After obtaining full informed written consent, the patient was positioned supine. The right neck and chest wall was prepped and draped in a sterile fashion. Ultrasound was placed in a sterile sleeve. Ultrasound was utilized to identify the right internal jugular vein which is noted to be echolucent and compressible indicating patency. Image is recorded for the permanent record. Under direct ultrasound visualization a micro-needle is inserted into the vein followed by the micro-wire. Micro-sheath was then advanced and a J wire is inserted without difficulty under fluoroscopic guidance. Small counterincision was made at the wire insertion site. Dilators are passed over the wire and the tunneled dialysis catheter is fed into the central venous system without difficulty.  Under fluoroscopy the catheter tip  positioned at the atrial caval junction. The catheter is then approximated to the right chest wall and an exit site selected. 1% lidocaine  is infiltrated in soft tissues at this level small incision is made and the tunneling device is then passed from the exit site to the right neck counterincision. Catheter is then connected to the tunneling device and the catheter was pulled subcutaneously. It is then transected and the hub assembly connected without difficulty. Both lumens aspirate and flush easily. After verification of smooth contour with proper tip position under fluoroscopy the catheter is packed with 5000 units of heparin  per lumen.  Catheter secured to the skin of the right chest wall with 0 silk. A sterile dressing is applied with a Biopatch.  COMPLICATIONS: None  CONDITION: Good  Cordella Shawl [facility]. Office:  [phone]   05/21/2024,5:30 PM

## 2024-05-21 NOTE — Progress Notes (Signed)
 Progress Note   Patient: Kevin Gross FMW:[id number] DOB: Jan 27, 1956 DOA: 05/15/2024     6 DOS: the patient was seen and examined on 05/21/2024   Brief hospital course: PCCM transfer for 05/19/2024  Taken from prior notes.  Kevin Gross is a 68 y.o. male with past medical history of hypertension, hyperlipidemia, CHF, CKD, iron  deficiency anemia, PAD, alcohol abuse, cirrhosis who presents to the ED complaining of shortness of breath, generalized weakness, nausea vomiting and diarrhea until a day prior to the admission, admitted on 9/18.  Patient continued to drink fluid but otherwise poor p.o. intake.  Patient reported abstinence from alcohol and tobacco, no recreational drug use.  No NSAID use  On presentation patient was alert and responsive, chronic bradycardia, hypotensive which did not responded well to IV fluid so started on pressors and admitted in ICU with concern of circulatory shock.  On admission patient was found to have acute metabolic acidosis with bicarb of 14, AKI with underlying CKD stage IV-patient became  oliguric and was started on dialysis during current hospitalization. Metabolic acidosis improved.  Patient was initially thought to have septic shock, all cultures which include blood and acetic fluid remain negative, sepsis ruled out.  Likely has circulatory shock secondary to his decompensated liver cirrhosis.  Significant ascites s/p paracentesis with removal of 4.8L-cultures negative. MELD sodium score of 26 on 05/18/2024.    Liver ultrasound with portal vein thrombosis, received vitamin K  and started on heparin  infusion.  Stool studies for C. difficile with positive antigen only, negative PCR and toxin-as patient was sick decided to start on p.o. vancomycin .  Patient was also started on ceftriaxone  and Zithromax  for concern of pneumonia-no radiologic evidence and unable to get Legionella/strep urinary antigen as he was not making urine.  GI was consulted for  decompensated liver cirrhosis and portal thrombosis, recommending continue anticoagulation for at least 6 months with cross-sectional imaging every 3 months to assess the response.  Started on octreotide  for 72-hour Patient already scheduled for outpatient colonoscopy on 05/27/2024 and no further GI workup needed at this time so they signed off.  Patient was transitioned off from vasopressors.  Received 3 days of dialysis and care is being transferred to [facility].  9/21: Vital stable with borderline soft blood pressure, labs with INR of 1.6, hemoglobin of 8.9, mild leukocytosis at 10.6, creatinine improved to 4.65 with BUN of 47.  9/22: Vital stable with some increase in creatinine, nephrology ordered permanent HD catheter placement-likely be done tomorrow.  Patient will need outpatient dialysis chair before discharge. PT is recommending home health  9/23: Vital stable, slight worsening of leukocytosis, no obvious infection.  Having his complete session of dialysis today, permanent HD catheter to be placed in the afternoon.  Assessment and Plan: * Shock circulatory (HCC) Multifactorial likely with volume depletion with gastroenteritis, underlying liver cirrhosis with concern of decompensation. Sepsis was considered but there was no obvious source of infection so sepsis ruled out. Initially required pressors, currently blood pressure within lower goal with midodrine . - Continue to monitor -Continue with midodrine   Acute renal failure History of underlying CKD stage IV. Most likely now ESRD. Patient with significant AKI, multifactorial with volume depletion and hypotension.  Renal ultrasound was negative for any obstruction.  Nephrology was consulted and patient was started on hemodialysis,  Permanent HD catheter to be placed later today - Nephrology is on board - Patient likely had dialysis chair in [place] with MWF schedule.  Community acquired pneumonia of right lower  lobe of lung Concern  of right lower lobe pleural effusion which can be hydrothorax secondary to cirrhosis versus underlying pneumonia. No significant upper respiratory symptoms but patient was started on antibiotics. - Completed the course of ceftriaxone  and Zithromax   Alcoholic cirrhosis of liver with ascites (HCC) Concern of decompensation, s/p paracentesis with removal of 4.8 L of fluid, cultures negative for any SBP. GI was consulted and they added octreotide  infusion for 72-hour. -Avoid constipation-need to use lactulose to titrate for 2-3 soft bowel movements -Outpatient GI follow-up  Portal vein thrombosis Patient was found to have portal vein thrombosis.  No indication for any intervention.  Patient was started on heparin  infusion and GI is recommending 6 months of anticoagulation and repeat imaging in 41-month to assess the response. - Heparin  was switched with Eliquis  - Discontinuing home aspirin  and Plavix  to decrease the risk of bleeding  Hypertension Patient was on lisinopril  and chlorthalidone  at home which are currently on hold due to softer blood pressure and patient is on midodrine . - Continue with midodrine  -Monitor blood pressure  Peripheral arterial disease Holding home aspirin  and Plavix  as patient is being started on Eliquis . - Continue with statin  Diarrhea Patient had diarrhea for the past 2 days before admission, has been resolved now.  C. difficile with positive antigen, negative PCR and toxin so less likely but he he was started on p.o. vancomycin  because of the risk and being very sick. GI pathogen panel negative. - Will stop p.o. vancomycin  today  Anemia Patient with history of iron  deficiency anemia.  Hemoglobin currently stable and no sign of bleeding. - Continue with iron  supplement -Monitor hemoglobin closely  Hyperlipidemia - Continue statin and Zetia   Generalized weakness PT evaluated him and recommending home health      Subjective: Patient was seen during  dialysis.  Denies any upper respiratory or abdominal symptoms.  No urinary symptoms.  No pain.  No more diarrhea.  Physical Exam: Vitals:   05/21/24 1245 05/21/24 1300 05/21/24 1310 05/21/24 1314  BP: (!) 86/45 (!) 92/49 (!) 102/52 (!) 99/51  Pulse: (!) 55 (!) 51 (!) 50 (!) 51  Resp: 13 15 14 17   Temp:    97.9 F (36.6 C)  TempSrc:    Oral  SpO2: 94% 96% 97% 97%  Weight:    114.6 kg  Height:       General.  Obese gentleman, in no acute distress. Pulmonary.  Lungs clear bilaterally, normal respiratory effort. CV.  Regular rate and rhythm, no JVD, rub or murmur. Abdomen.  Soft, nontender, nondistended, BS positive. CNS.  Alert and oriented .  No focal neurologic deficit. Extremities.  2+ upper extremity edema, no LE edema Psychiatry.  Judgment and insight appears normal.   Data Reviewed: Prior data reviewed  Family Communication: Discussed with patient  Disposition: Status is: Inpatient Remains inpatient appropriate because: Severity of illness.  Planned Discharge Destination: Home with Home Health  DVT prophylaxis.  Eliquis  Time spent: 50 minutes  This record has been created using Conservation officer, historic buildings. Errors have been sought and corrected,but may not always be located. Such creation errors do not reflect on the standard of care.   Author: Amaryllis Dare, MD 05/21/2024 1:38 PM  For on call review [email or web address].

## 2024-05-21 NOTE — Plan of Care (Signed)
  Problem: Education: Goal: Knowledge of General Education information will improve Description: Including pain rating scale, medication(s)/side effects and non-pharmacologic comfort measures Outcome: Progressing   Problem: Health Behavior/Discharge Planning: Goal: Ability to manage health-related needs will improve Outcome: Progressing   Problem: Clinical Measurements: Goal: Ability to maintain clinical measurements within normal limits will improve Outcome: Progressing Goal: Will remain free from infection Outcome: Progressing Goal: Diagnostic test results will improve Outcome: Progressing Goal: Respiratory complications will improve Outcome: Progressing Goal: Cardiovascular complication will be avoided Outcome: Progressing   Problem: Activity: Goal: Risk for activity intolerance will decrease Outcome: Progressing   Problem: Nutrition: Goal: Adequate nutrition will be maintained Outcome: Progressing   Problem: Coping: Goal: Level of anxiety will decrease Outcome: Progressing   Problem: Elimination: Goal: Will not experience complications related to bowel motility Outcome: Progressing Goal: Will not experience complications related to urinary retention Outcome: Progressing   Problem: Pain Managment: Goal: General experience of comfort will improve and/or be controlled Outcome: Progressing   Problem: Safety: Goal: Ability to remain free from injury will improve Outcome: Progressing   Problem: Skin Integrity: Goal: Risk for impaired skin integrity will decrease Outcome: Progressing   Problem: Education: Goal: Knowledge of disease and its progression will improve Outcome: Progressing   Problem: Health Behavior/Discharge Planning: Goal: Ability to manage health-related needs will improve Outcome: Progressing   Problem: Clinical Measurements: Goal: Complications related to the disease process or treatment will be avoided or minimized Outcome:  Progressing Goal: Dialysis access will remain free of complications Outcome: Progressing   Problem: Activity: Goal: Activity intolerance will improve Outcome: Progressing   Problem: Fluid Volume: Goal: Fluid volume balance will be maintained or improved Outcome: Progressing   Problem: Nutritional: Goal: Ability to make appropriate dietary choices will improve Outcome: Progressing   Problem: Respiratory: Goal: Respiratory symptoms related to disease process will be avoided Outcome: Progressing   Problem: Self-Concept: Goal: Body image disturbance will be avoided or minimized Outcome: Progressing   Problem: Urinary Elimination: Goal: Progression of disease will be identified and treated Outcome: Progressing

## 2024-05-21 NOTE — Interval H&P Note (Signed)
 History and Physical Interval Note:  05/21/2024 3:43 PM  Kevin Gross  has presented today for surgery, with the diagnosis of ESRD.  The various methods of treatment have been discussed with the patient and family. After consideration of risks, benefits and other options for treatment, the patient has consented to  Procedure(s): DIALYSIS/PERMA CATHETER INSERTION (Right) as a surgical intervention.  The patient's history has been reviewed, patient examined, no change in status, stable for surgery.  I have reviewed the patient's chart and labs.  Questions were answered to the patient's satisfaction.     Cordella Shawl

## 2024-05-21 NOTE — Progress Notes (Signed)
 [facility]  ROUNDING NOTE   Subjective:   Patient seen and evaluated during dialysis   HEMODIALYSIS FLOWSHEET:  Blood Flow Rate (mL/min): 300 mL/min Arterial Pressure (mmHg): -141.22 mmHg Venous Pressure (mmHg): 130.91 mmHg TMP (mmHg): 9.9 mmHg Ultrafiltration Rate (mL/min): 543 mL/min Dialysate Flow Rate (mL/min): 300 ml/min Dialysis Fluid Bolus: Normal Saline Bolus Amount (mL): 300 mL  Resting comfortably during treatment.   09/22 0701 - 09/23 0700 In: 1466.9 [P.O.:720; I.V.:496.9; IV Piggyback:250] Out: -  Lab Results  Component Value Date   CREATININE 6.09 (H) 05/21/2024   CREATININE 5.32 (H) 05/20/2024   CREATININE 4.65 (H) 05/19/2024      Objective:  Vital signs in last 24 hours:  Temp:  [97.9 F (36.6 C)-98.4 F (36.9 C)] 98.4 F (36.9 C) (09/23 0900) Pulse Rate:  [45-64] 55 (09/23 1045) Resp:  [14-17] 14 (09/23 1045) BP: (94-117)/(41-62) 94/54 (09/23 1045) SpO2:  [90 %-97 %] 94 % (09/23 1045) FiO2 (%):  [21 %] 21 % (09/22 2253) Weight:  [884 kg] 115 kg (09/23 0900)  Weight change:  Filed Weights   05/19/24 0459 05/20/24 0500 05/21/24 0900  Weight: 115 kg 113.4 kg 115 kg    Intake/Output: I/O last 3 completed shifts: In: 1873 [P.O.:720; I.V.:803; IV Piggyback:350] Out: 100 [Urine:100]   Intake/Output this shift:  No intake/output data recorded.  Physical Exam: General: NAD  Head: Normocephalic, atraumatic. Moist oral mucosal membranes  Eyes: Anicteric  Lungs:  Clear to auscultation, normal effort  Heart: Regular rate and rhythm  Abdomen:  Soft, nontender  Extremities: Trace-1+ peripheral edema.  Neurologic: Awake, alert, conversant  Skin: Warm,dry, no rash  Access: Lt femoral Trialysis cath    Basic Metabolic Panel: Recent Labs  Lab 05/16/24 0441 05/16/24 1128 05/17/24 0544 05/18/24 0447 05/19/24 0435 05/20/24 0313 05/21/24 0456  NA 143   < > 142 140 136 139 135  K 4.8   < > 4.1 3.8 3.6 3.8 3.4*  CL 117*   < >  106 101 100 102 101  CO2 16*   < > 20* 22 26 26 22   GLUCOSE 104*   < > 121* 112* 110* 114* 120*  BUN 115*   < > 91* 64* 47* 58* 74*  CREATININE 10.89*   < > 7.87* 5.73* 4.65* 5.32* 6.09*  CALCIUM  7.6*   < > 7.8* 8.0* 7.7* 7.8* 7.9*  MG 2.5*  --  2.0 1.9 1.8 1.7 2.2  PHOS 8.5*  --  6.3* 4.5  --   --   --    < > = values in this interval not displayed.    Liver Function Tests: Recent Labs  Lab 05/15/24 1528 05/16/24 0441 05/17/24 0544 05/21/24 0456  AST 98* 95* 95* 59*  ALT 49* 47* 41 31  ALKPHOS 187* 183* 128* 97  BILITOT 1.2 1.3* 1.7* 1.3*  PROT 6.1* 5.8* 5.8* 5.2*  ALBUMIN  2.3* 2.2* 3.2* 2.4*   No results for input(s): LIPASE, AMYLASE in the last 168 hours. Recent Labs  Lab 05/16/24 1128  AMMONIA 67*    CBC: Recent Labs  Lab 05/17/24 0544 05/18/24 0447 05/19/24 0435 05/20/24 0313 05/21/24 0456  WBC 10.9* 9.4 10.6* 10.4 15.5*  HGB 9.1* 9.1* 8.9* 10.1* 10.6*  HCT 27.2* 27.8* 27.6* 31.0* 32.7*  MCV 96.8 97.9 99.3 100.6* 100.9*  PLT 123* 108* 104* 114* 146*    Cardiac Enzymes: No results for input(s): CKTOTAL, CKMB, CKMBINDEX, TROPONINI in the last 168 hours.  BNP: Invalid input(s): POCBNP  CBG: Recent Labs  Lab 05/15/24 2047 05/16/24 1605  GLUCAP 80 120*    Microbiology: Results for orders placed or performed during the hospital encounter of 05/15/24  MRSA Next Gen by PCR, Nasal     Status: None   Collection Time: 05/15/24  8:57 PM   Specimen: Nasal Mucosa; Nasal Swab  Result Value Ref Range Status   MRSA by PCR Next Gen NOT DETECTED NOT DETECTED Final    Comment: (NOTE) The GeneXpert MRSA Assay (FDA approved for NASAL specimens only), is one component of a comprehensive MRSA colonization surveillance program. It is not intended to diagnose MRSA infection nor to guide or monitor treatment for MRSA infections. Test performance is not FDA approved in patients less than 33 years old. Performed at [facility], [address] [place], [place] [address]   SARS Coronavirus 2 by RT PCR (hospital order, performed in [facility] hospital lab) *cepheid single result test* Anterior Nasal Swab     Status: None   Collection Time: 05/15/24  8:58 PM   Specimen: Anterior Nasal Swab  Result Value Ref Range Status   SARS Coronavirus 2 by RT PCR NEGATIVE NEGATIVE Final    Comment: (NOTE) SARS-CoV-2 target nucleic acids are NOT DETECTED.  The SARS-CoV-2 RNA is generally detectable in upper and lower respiratory specimens during the acute phase of infection. The lowest concentration of SARS-CoV-2 viral copies this assay can detect is 250 copies / mL. A negative result does not preclude SARS-CoV-2 infection and should not be used as the sole basis for treatment or other patient management decisions.  A negative result may occur with improper specimen collection / handling, submission of specimen other than nasopharyngeal swab, presence of viral mutation(s) within the areas targeted by this assay, and inadequate number of viral copies (<250 copies / mL). A negative result must be combined with clinical observations, patient history, and epidemiological information.  Fact Sheet for Patients:   RoadLapTop.co.za  Fact Sheet for Healthcare Providers: http://kim-miller.com/  This test is not yet approved or  cleared by the [place]  FDA and has been authorized for detection and/or diagnosis of SARS-CoV-2 by FDA under an Emergency Use Authorization (EUA).  This EUA will remain in effect (meaning this test can be used) for the duration of the COVID-19 declaration under Section 564(b)(1) of the Act, 21 U.S.C. section 360bbb-3(b)(1), unless the authorization is terminated or revoked sooner.  Performed at [facility], [address] [place], [place] [address]   Body fluid culture w Gram Stain     Status: None   Collection Time: 05/16/24  2:50 PM   Specimen: PATH  Cytology Peritoneal fluid  Result Value Ref Range Status   Specimen Description   Final    PERITONEAL Performed at [facility], [address], [place], [place] [address]    Special Requests   Final    NONE Performed at [facility], [address] [place], [place] [address]    Gram Stain   Final    WBC PRESENT, PREDOMINANTLY MONONUCLEAR NO ORGANISMS SEEN CYTOSPIN SMEAR    Culture   Final    NO GROWTH 3 DAYS Performed at [facility], [address], [place], [place] [address]    Report Status 05/20/2024 FINAL  Final  Culture, blood (Routine X 2) w Reflex to ID Panel     Status: None   Collection Time: 05/16/24  6:15 PM   Specimen: BLOOD  Result Value Ref Range  Status   Specimen Description BLOOD BLOOD RIGHT ARM  Final   Special Requests   Final    BOTTLES DRAWN AEROBIC AND ANAEROBIC Blood Culture adequate volume   Culture   Final    NO GROWTH 5 DAYS Performed at [facility], [address] [place], [place] [address]    Report Status 05/21/2024 FINAL  Final  Culture, blood (Routine X 2) w Reflex to ID Panel     Status: None   Collection Time: 05/16/24  6:28 PM   Specimen: BLOOD  Result Value Ref Range Status   Specimen Description BLOOD BLOOD RIGHT ARM  Final   Special Requests   Final    BOTTLES DRAWN AEROBIC AND ANAEROBIC Blood Culture adequate volume   Culture   Final    NO GROWTH 5 DAYS Performed at [facility], [address] [place], [place] [address]    Report Status 05/21/2024 FINAL  Final  Gastrointestinal Panel by PCR , Stool     Status: None   Collection Time: 05/17/24  3:20 AM   Specimen: Stool  Result Value Ref Range Status   Campylobacter species NOT DETECTED NOT DETECTED Final   Plesimonas shigelloides NOT DETECTED NOT DETECTED Final   Salmonella species NOT DETECTED NOT DETECTED Final   Yersinia enterocolitica NOT DETECTED NOT DETECTED Final   Vibrio species NOT DETECTED NOT DETECTED Final    Vibrio cholerae NOT DETECTED NOT DETECTED Final   Enteroaggregative E coli (EAEC) NOT DETECTED NOT DETECTED Final   Enteropathogenic E coli (EPEC) NOT DETECTED NOT DETECTED Final   Enterotoxigenic E coli (ETEC) NOT DETECTED NOT DETECTED Final   Shiga like toxin producing E coli (STEC) NOT DETECTED NOT DETECTED Final   Shigella/Enteroinvasive E coli (EIEC) NOT DETECTED NOT DETECTED Final   Cryptosporidium NOT DETECTED NOT DETECTED Final   Cyclospora cayetanensis NOT DETECTED NOT DETECTED Final   Entamoeba histolytica NOT DETECTED NOT DETECTED Final   Giardia lamblia NOT DETECTED NOT DETECTED Final   Adenovirus F40/41 NOT DETECTED NOT DETECTED Final   Astrovirus NOT DETECTED NOT DETECTED Final   Norovirus GI/GII NOT DETECTED NOT DETECTED Final   Rotavirus A NOT DETECTED NOT DETECTED Final   Sapovirus (I, II, IV, and V) NOT DETECTED NOT DETECTED Final    Comment: Performed at [facility], [address] [place], [place] [address]  C Difficile Quick Screen w PCR reflex     Status: Abnormal   Collection Time: 05/17/24  4:34 PM   Specimen: STOOL  Result Value Ref Range Status   C Diff antigen POSITIVE (A) NEGATIVE Final   C Diff toxin NEGATIVE NEGATIVE Final   C Diff interpretation Results are indeterminate. See PCR results.  Final    Comment: Performed at [facility], [address] [place], [place] [address]  C. Diff by PCR, Reflexed     Status: None   Collection Time: 05/17/24  4:34 PM  Result Value Ref Range Status   Toxigenic C. Difficile by PCR NEGATIVE NEGATIVE Final    Comment: Patient is colonized with non toxigenic C. difficile. May not need treatment unless significant symptoms are present.   Hypervirulent Strain PRESUMPTIVE NEGATIVE PRESUMPTIVE NEGATIVE Final    Comment: Performed at [facility], [address] [place], [place] [address]    Coagulation Studies: Recent Labs    05/18/24 1056 05/19/24 0435 05/20/24 0313 05/21/24 0456   LABPROT 18.7* 19.4* 23.7* 28.1*  INR 1.5* 1.6* 2.0* 2.5*    Urinalysis:  No results for input(s): COLORURINE, LABSPEC, PHURINE, GLUCOSEU, HGBUR, BILIRUBINUR, KETONESUR, PROTEINUR, UROBILINOGEN, NITRITE, LEUKOCYTESUR in the last 72 hours.  Invalid input(s): APPERANCEUR     Imaging: No results found.    Medications:    octreotide  (SANDOSTATIN ) 500 mcg in sodium chloride  0.9 % 250 mL (2 mcg/mL) infusion 50 mcg/hr (05/21/24 0615)    Chlorhexidine  Gluconate Cloth  6 each Topical Q0600   ezetimibe   10 mg Oral Daily   feeding supplement  237 mL Oral TID BM   iron  polysaccharides  150 mg Oral BID   melatonin  5 mg Oral QHS   midodrine   10 mg Oral TID WC   multivitamin  1 tablet Oral QHS   multivitamin with minerals  1 tablet Oral Daily   pantoprazole   40 mg Oral Daily   simvastatin   20 mg Oral QHS   thiamine   100 mg Oral Daily   vancomycin   125 mg Oral QID   alteplase , docusate sodium , heparin , ipratropium-albuterol , lidocaine -prilocaine , mouth rinse, pentafluoroprop-tetrafluoroeth, polyethylene glycol  Assessment/ Plan:  [name] is a 68 y.o.  male  with past medical conditions including hypertension, COPD, obstructive sleep apnea with CPAP, anemia, and chronic kidney disease stage IV, who was admitted to [facility] on 05/15/2024 for Shock circulatory (HCC) [R57.9] Generalized weakness [R53.1] Hypotension, unspecified hypotension type [I95.9] Acute renal failure, unspecified acute renal failure type [N17.9]   Acute kidney injury on chronic kidney disease stage IV. Baseline creatinine 2.77 with GFR 24 06/30/2024. Acute kidney injury likely secondary to hypotension and infectious process. Renal ultrasound negative for obstruction. No recent IV contrast exposure.    Receiving dialysis today, UF 1L as tolerated. Next treatment scheduled for Thursday. Dialysis coordinator aware of patient and will seek placement at [facility].   Lab Results  Component  Value Date   CREATININE 6.09 (H) 05/21/2024   CREATININE 5.32 (H) 05/20/2024   CREATININE 4.65 (H) 05/19/2024    Intake/Output Summary (Last 24 hours) at 05/21/2024 1053 Last data filed at 05/21/2024 0309 Gross per 24 hour  Intake 1226.92 ml  Output --  Net 1226.92 ml   2.  Acute metabolic acidosis, serum bicarbonate corrected with dialysis  3. Anemia of chronic kidney disease Lab Results  Component Value Date   HGB 10.6 (L) 05/21/2024  Hemoglobin 10.6. No indication for ESA at this time  4.  Hypotension likely sepsis related, source unknown at this time.  Patient off pressors. Blood pressure remains soft but stable, 94/54 with dialysis. Continue Midodrine .   5. Secondary Hyperparathyroidism: with outpatient labs: PTH 52.7, phosphorus 4.1, calcium  8.6 on 7/2.  Lab Results  Component Value Date   CALCIUM  7.9 (L) 05/21/2024   CAION 1.21 08/08/2023   PHOS 4.5 05/18/2024    Calcium  decreased but stable.    LOS: 6 Alcides Nutting 9/23/202510:53 AM

## 2024-05-21 NOTE — Plan of Care (Signed)

## 2024-05-21 NOTE — Progress Notes (Signed)
 Mobility Specialist Progress Note:    05/21/24 1431  Mobility  Activity Pivoted/transferred to/from [facility]  Level of Assistance Contact guard assist, steadying assist  Assistive Device Front wheel walker  Distance Ambulated (ft) 2 ft  Range of Motion/Exercises Active;All extremities  Activity Response Tolerated well  Mobility visit 1 Mobility  Mobility Specialist Start Time (ACUTE ONLY) 1424  Mobility Specialist Stop Time (ACUTE ONLY) 1431  Mobility Specialist Time Calculation (min) (ACUTE ONLY) 7 min   Pt received requesting assistance. Required CGA to stand and transfer with RW. Tolerated well, asx throughout. Left supine, alarm on and nurse in room. All needs met.  Sherrilee Ditty Mobility Specialist Please contact via Special educational needs teacher or  Rehab office at [phone]

## 2024-05-21 NOTE — Assessment & Plan Note (Signed)
 PT evaluated him and recommending home health

## 2024-05-22 ENCOUNTER — Encounter: Payer: Self-pay | Admitting: Vascular Surgery

## 2024-05-22 DIAGNOSIS — Z4901 Encounter for fitting and adjustment of extracorporeal dialysis catheter: Secondary | ICD-10-CM

## 2024-05-22 DIAGNOSIS — Z9889 Other specified postprocedural states: Secondary | ICD-10-CM

## 2024-05-22 DIAGNOSIS — R579 Shock, unspecified: Secondary | ICD-10-CM | POA: Diagnosis not present

## 2024-05-22 DIAGNOSIS — Z95828 Presence of other vascular implants and grafts: Secondary | ICD-10-CM

## 2024-05-22 LAB — BASIC METABOLIC PANEL WITH GFR
Anion gap: 13 (ref 5–15)
BUN: 63 mg/dL — ABNORMAL HIGH (ref 8–23)
CO2: 21 mmol/L — ABNORMAL LOW (ref 22–32)
Calcium: 8 mg/dL — ABNORMAL LOW (ref 8.9–10.3)
Chloride: 100 mmol/L (ref 98–111)
Creatinine, Ser: 5.37 mg/dL — ABNORMAL HIGH (ref 0.61–1.24)
GFR, Estimated: 11 mL/min — ABNORMAL LOW (ref >60)
Glucose, Bld: 130 mg/dL — ABNORMAL HIGH (ref 70–99)
Potassium: 3.5 mmol/L (ref 3.5–5.1)
Sodium: 134 mmol/L — ABNORMAL LOW (ref 135–145)

## 2024-05-22 LAB — CBC
HCT: 34.3 % — ABNORMAL LOW (ref 39.0–52.0)
Hemoglobin: 11.1 g/dL — ABNORMAL LOW (ref 13.0–17.0)
MCH: 33.1 pg (ref 26.0–34.0)
MCHC: 32.4 g/dL (ref 30.0–36.0)
MCV: 102.4 fL — ABNORMAL HIGH (ref 80.0–100.0)
Platelets: 187 K/uL (ref 150–400)
RBC: 3.35 MIL/uL — ABNORMAL LOW (ref 4.22–5.81)
RDW: 23.5 % — ABNORMAL HIGH (ref 11.5–15.5)
WBC: 15.6 K/uL — ABNORMAL HIGH (ref 4.0–10.5)
nRBC: 0.1 % (ref 0.0–0.2)

## 2024-05-22 MED ORDER — NEPRO/CARBSTEADY PO LIQD: 237.0000 mL | Freq: Two times a day (BID) | ORAL | Status: DC

## 2024-05-22 NOTE — Progress Notes (Signed)
 [facility]  ROUNDING NOTE   Subjective:   Patient seen resting quietly Complains of mild soreness from PermCath site Tolerating meals without nausea or vomiting Room air  09/23 0701 - 09/24 0700 In: 1103 [P.O.:480; I.V.:161; IV Piggyback:462] Out: 400  Lab Results  Component Value Date   CREATININE 5.37 (H) 05/22/2024   CREATININE 6.09 (H) 05/21/2024   CREATININE 5.32 (H) 05/20/2024      Objective:  Vital signs in last 24 hours:  Temp:  [96.8 F (36 C)-99.3 F (37.4 C)] 99.3 F (37.4 C) (09/24 1000) Pulse Rate:  [46-69] 48 (09/24 1000) Resp:  [13-18] 16 (09/24 1000) BP: (80-126)/(40-105) 100/50 (09/24 1000) SpO2:  [86 %-100 %] 97 % (09/24 1000) Weight:  [114.6 kg] 114.6 kg (09/23 1314)  Weight change:  Filed Weights   05/20/24 0500 05/21/24 0900 05/21/24 1314  Weight: 113.4 kg 115 kg 114.6 kg    Intake/Output: I/O last 3 completed shifts: In: 1382.2 [P.O.:480; I.V.:440.3; IV Piggyback:462] Out: 400 [Other:400]   Intake/Output this shift:  No intake/output data recorded.  Physical Exam: General: NAD  Head: Normocephalic, atraumatic. Moist oral mucosal membranes  Eyes: Anicteric  Lungs:  Clear to auscultation, normal effort  Heart: Regular rate and rhythm  Abdomen:  Soft, nontender  Extremities: Trace-1+ peripheral edema.  Neurologic: Awake, alert, conversant  Skin: Warm,dry, no rash  Access: Right IJ PermCath    Basic Metabolic Panel: Recent Labs  Lab 05/16/24 0441 05/16/24 1128 05/17/24 0544 05/18/24 0447 05/19/24 0435 05/20/24 0313 05/21/24 0456 05/22/24 1129  NA 143   < > 142 140 136 139 135 134*  K 4.8   < > 4.1 3.8 3.6 3.8 3.4* 3.5  CL 117*   < > 106 101 100 102 101 100  CO2 16*   < > 20* 22 26 26 22  21*  GLUCOSE 104*   < > 121* 112* 110* 114* 120* 130*  BUN 115*   < > 91* 64* 47* 58* 74* 63*  CREATININE 10.89*   < > 7.87* 5.73* 4.65* 5.32* 6.09* 5.37*  CALCIUM  7.6*   < > 7.8* 8.0* 7.7* 7.8* 7.9* 8.0*  MG 2.5*  --  2.0  1.9 1.8 1.7 2.2  --   PHOS 8.5*  --  6.3* 4.5  --   --   --   --    < > = values in this interval not displayed.    Liver Function Tests: Recent Labs  Lab 05/15/24 1528 05/16/24 0441 05/17/24 0544 05/21/24 0456  AST 98* 95* 95* 59*  ALT 49* 47* 41 31  ALKPHOS 187* 183* 128* 97  BILITOT 1.2 1.3* 1.7* 1.3*  PROT 6.1* 5.8* 5.8* 5.2*  ALBUMIN  2.3* 2.2* 3.2* 2.4*   No results for input(s): LIPASE, AMYLASE in the last 168 hours. Recent Labs  Lab 05/16/24 1128  AMMONIA 67*    CBC: Recent Labs  Lab 05/18/24 0447 05/19/24 0435 05/20/24 0313 05/21/24 0456 05/22/24 1129  WBC 9.4 10.6* 10.4 15.5* 15.6*  HGB 9.1* 8.9* 10.1* 10.6* 11.1*  HCT 27.8* 27.6* 31.0* 32.7* 34.3*  MCV 97.9 99.3 100.6* 100.9* 102.4*  PLT 108* 104* 114* 146* 187    Cardiac Enzymes: No results for input(s): CKTOTAL, CKMB, CKMBINDEX, TROPONINI in the last 168 hours.  BNP: Invalid input(s): POCBNP  CBG: Recent Labs  Lab 05/15/24 2047 05/16/24 1605  GLUCAP 80 120*    Microbiology: Results for orders placed or performed during the hospital encounter of 05/15/24  MRSA Next Gen  by PCR, Nasal     Status: None   Collection Time: 05/15/24  8:57 PM   Specimen: Nasal Mucosa; Nasal Swab  Result Value Ref Range Status   MRSA by PCR Next Gen NOT DETECTED NOT DETECTED Final    Comment: (NOTE) The GeneXpert MRSA Assay (FDA approved for NASAL specimens only), is one component of a comprehensive MRSA colonization surveillance program. It is not intended to diagnose MRSA infection nor to guide or monitor treatment for MRSA infections. Test performance is not FDA approved in patients less than 79 years old. Performed at [facility], [address] [place], [place] [address]   SARS Coronavirus 2 by RT PCR (hospital order, performed in [facility] hospital lab) *cepheid single result test* Anterior Nasal Swab     Status: None   Collection Time: 05/15/24  8:58 PM   Specimen: Anterior  Nasal Swab  Result Value Ref Range Status   SARS Coronavirus 2 by RT PCR NEGATIVE NEGATIVE Final    Comment: (NOTE) SARS-CoV-2 target nucleic acids are NOT DETECTED.  The SARS-CoV-2 RNA is generally detectable in upper and lower respiratory specimens during the acute phase of infection. The lowest concentration of SARS-CoV-2 viral copies this assay can detect is 250 copies / mL. A negative result does not preclude SARS-CoV-2 infection and should not be used as the sole basis for treatment or other patient management decisions.  A negative result may occur with improper specimen collection / handling, submission of specimen other than nasopharyngeal swab, presence of viral mutation(s) within the areas targeted by this assay, and inadequate number of viral copies (<250 copies / mL). A negative result must be combined with clinical observations, patient history, and epidemiological information.  Fact Sheet for Patients:   RoadLapTop.co.za  Fact Sheet for Healthcare Providers: http://kim-miller.com/  This test is not yet approved or  cleared by the [place]  FDA and has been authorized for detection and/or diagnosis of SARS-CoV-2 by FDA under an Emergency Use Authorization (EUA).  This EUA will remain in effect (meaning this test can be used) for the duration of the COVID-19 declaration under Section 564(b)(1) of the Act, 21 U.S.C. section 360bbb-3(b)(1), unless the authorization is terminated or revoked sooner.  Performed at [facility], [address] [place], [place] [address]   Body fluid culture w Gram Stain     Status: None   Collection Time: 05/16/24  2:50 PM   Specimen: PATH Cytology Peritoneal fluid  Result Value Ref Range Status   Specimen Description   Final    PERITONEAL Performed at [facility], [address] [place], [place] [address]    Special Requests   Final    NONE Performed at  [facility], [address] [place], [place] [address]    Gram Stain   Final    WBC PRESENT, PREDOMINANTLY MONONUCLEAR NO ORGANISMS SEEN CYTOSPIN SMEAR    Culture   Final    NO GROWTH 3 DAYS Performed at [facility], [address] [place], [place] [address]    Report Status 05/20/2024 FINAL  Final  Culture, blood (Routine X 2) w Reflex to ID Panel     Status: None   Collection Time: 05/16/24  6:15 PM   Specimen: BLOOD  Result Value Ref Range Status   Specimen Description BLOOD BLOOD RIGHT ARM  Final   Special Requests   Final    BOTTLES DRAWN AEROBIC AND ANAEROBIC Blood Culture adequate volume   Culture  Final    NO GROWTH 5 DAYS Performed at [facility], [address] [place], [place] [address]    Report Status 05/21/2024 FINAL  Final  Culture, blood (Routine X 2) w Reflex to ID Panel     Status: None   Collection Time: 05/16/24  6:28 PM   Specimen: BLOOD  Result Value Ref Range Status   Specimen Description BLOOD BLOOD RIGHT ARM  Final   Special Requests   Final    BOTTLES DRAWN AEROBIC AND ANAEROBIC Blood Culture adequate volume   Culture   Final    NO GROWTH 5 DAYS Performed at [facility], [address] [place], [place] [address]    Report Status 05/21/2024 FINAL  Final  Gastrointestinal Panel by PCR , Stool     Status: None   Collection Time: 05/17/24  3:20 AM   Specimen: Stool  Result Value Ref Range Status   Campylobacter species NOT DETECTED NOT DETECTED Final   Plesimonas shigelloides NOT DETECTED NOT DETECTED Final   Salmonella species NOT DETECTED NOT DETECTED Final   Yersinia enterocolitica NOT DETECTED NOT DETECTED Final   Vibrio species NOT DETECTED NOT DETECTED Final   Vibrio cholerae NOT DETECTED NOT DETECTED Final   Enteroaggregative E coli (EAEC) NOT DETECTED NOT DETECTED Final   Enteropathogenic E coli (EPEC) NOT DETECTED NOT DETECTED Final   Enterotoxigenic E coli (ETEC) NOT DETECTED NOT DETECTED  Final   Shiga like toxin producing E coli (STEC) NOT DETECTED NOT DETECTED Final   Shigella/Enteroinvasive E coli (EIEC) NOT DETECTED NOT DETECTED Final   Cryptosporidium NOT DETECTED NOT DETECTED Final   Cyclospora cayetanensis NOT DETECTED NOT DETECTED Final   Entamoeba histolytica NOT DETECTED NOT DETECTED Final   Giardia lamblia NOT DETECTED NOT DETECTED Final   Adenovirus F40/41 NOT DETECTED NOT DETECTED Final   Astrovirus NOT DETECTED NOT DETECTED Final   Norovirus GI/GII NOT DETECTED NOT DETECTED Final   Rotavirus A NOT DETECTED NOT DETECTED Final   Sapovirus (I, II, IV, and V) NOT DETECTED NOT DETECTED Final    Comment: Performed at [facility], [address] [place], [place] [address]  C Difficile Quick Screen w PCR reflex     Status: Abnormal   Collection Time: 05/17/24  4:34 PM   Specimen: STOOL  Result Value Ref Range Status   C Diff antigen POSITIVE (A) NEGATIVE Final   C Diff toxin NEGATIVE NEGATIVE Final   C Diff interpretation Results are indeterminate. See PCR results.  Final    Comment: Performed at [facility], [address] [place], [place] [address]  C. Diff by PCR, Reflexed     Status: None   Collection Time: 05/17/24  4:34 PM  Result Value Ref Range Status   Toxigenic C. Difficile by PCR NEGATIVE NEGATIVE Final    Comment: Patient is colonized with non toxigenic C. difficile. May not need treatment unless significant symptoms are present.   Hypervirulent Strain PRESUMPTIVE NEGATIVE PRESUMPTIVE NEGATIVE Final    Comment: Performed at [facility], [address] [place], [place] [address]    Coagulation Studies: Recent Labs    05/20/24 0313 05/21/24 0456  LABPROT 23.7* 28.1*  INR 2.0* 2.5*    Urinalysis: No results for input(s): COLORURINE, LABSPEC, PHURINE, GLUCOSEU, HGBUR, BILIRUBINUR, KETONESUR, PROTEINUR, UROBILINOGEN, NITRITE, LEUKOCYTESUR in the last 72 hours.  Invalid input(s):  APPERANCEUR     Imaging: PERIPHERAL VASCULAR CATHETERIZATION Result Date: 05/21/2024 See surgical note for result.  Medications:      apixaban   10 mg Oral BID   Followed by   NOREEN ON 05/28/2024] apixaban   5 mg Oral BID   Chlorhexidine  Gluconate Cloth  6 each Topical Q0600   ezetimibe   10 mg Oral Daily   feeding supplement  237 mL Oral TID BM   iron  polysaccharides  150 mg Oral BID   melatonin  5 mg Oral QHS   midodrine   10 mg Oral TID WC   multivitamin  1 tablet Oral QHS   multivitamin with minerals  1 tablet Oral Daily   pantoprazole   40 mg Oral Daily   simvastatin   20 mg Oral QHS   thiamine   100 mg Oral Daily   docusate sodium , ipratropium-albuterol , mouth rinse, polyethylene glycol  Assessment/ Plan:  Kevin Gross is a 68 y.o.  male  with past medical conditions including hypertension, COPD, obstructive sleep apnea with CPAP, anemia, and chronic kidney disease stage IV, who was admitted to [facility] on 05/15/2024 for Shock circulatory (HCC) [R57.9] Generalized weakness [R53.1] Hypotension, unspecified hypotension type [I95.9] Acute renal failure, unspecified acute renal failure type [N17.9]   Acute kidney injury on chronic kidney disease stage IV. Baseline creatinine 2.77 with GFR 24 06/30/2024. Acute kidney injury likely secondary to hypotension and infectious process. Renal ultrasound negative for obstruction. No recent IV contrast exposure.    Appreciate vascular for placing right IJ PermCath yesterday.  Also appreciate nursing for removing HD temp cath.  Patient will receive scheduled dialysis on Thursday.  Awaiting outpatient clinic acceptance.  Lab Results  Component Value Date   CREATININE 5.37 (H) 05/22/2024   CREATININE 6.09 (H) 05/21/2024   CREATININE 5.32 (H) 05/20/2024    Intake/Output Summary (Last 24 hours) at 05/22/2024 1244 Last data filed at 05/22/2024 0235 Gross per 24 hour  Intake 1102.98 ml  Output 400 ml  Net 702.98 ml   2.  Acute  metabolic acidosis, serum bicarbonate corrected with dialysis  3. Anemia of chronic kidney disease Lab Results  Component Value Date   HGB 11.1 (L) 05/22/2024  Hemoglobin 11.1.  Will continue to monitor for now.  4.  Hypotension likely sepsis related, source unknown at this time.  Patient off pressors. Blood pressure remains soft 100/50.  Continue scheduled midodrine  3 times daily.  5. Secondary Hyperparathyroidism: with outpatient labs: PTH 52.7, phosphorus 4.1, calcium  8.6 on 7/2.  Lab Results  Component Value Date   CALCIUM  8.0 (L) 05/22/2024   CAION 1.21 08/08/2023   PHOS 4.5 05/18/2024    Will continue to monitor bone minerals during this admission.   LOS: 7 Hydee Fleece 9/24/202512:44 PM

## 2024-05-22 NOTE — TOC Initial Note (Signed)
 Transition of Care [facility]) - Initial/Assessment Note    Patient Details  Name: Kevin Gross MRN: [id number] Date of Birth: [birth year]  Transition of Care [facility] CM/SW Contact:    Corean ONEIDA Haddock, RN Phone Number: 05/22/2024, 11:04 AM  Clinical Narrative:                    Attempted to meet with patient to discuss recs for home health.  Nurse tech currently with patient providing patient care.  CM to follow up   Barriers to Discharge: Continued Medical Work up   Patient Goals and CMS Choice            Expected Discharge Plan and Services                                              Prior Living Arrangements/Services   Lives with:: Spouse, Adult Children (Also lives with daughters boyfriend.) Patient language and need for interpreter reviewed:: Yes        Need for Family Participation in Patient Care: Yes (Comment)     Criminal Activity/Legal Involvement Pertinent to Current Situation/Hospitalization: No - Comment as needed  Activities of Daily Living   ADL Screening (condition at time of admission) Independently performs ADLs?: Yes (appropriate for developmental age) Is the patient deaf or have difficulty hearing?: No Does the patient have difficulty seeing, even when wearing glasses/contacts?: No Does the patient have difficulty concentrating, remembering, or making decisions?: No  Permission Sought/Granted                  Emotional Assessment Appearance:: Appears stated age Attitude/Demeanor/Rapport: Engaged, Gracious Affect (typically observed): Calm, Pleasant Orientation: : Oriented to Self, Oriented to Place, Oriented to  Time, Oriented to Situation Alcohol / Substance Use: Not Applicable Psych Involvement: No (comment)  Admission diagnosis:  Shock circulatory (HCC) [R57.9] Generalized weakness [R53.1] Hypotension, unspecified hypotension type [I95.9] Acute renal failure, unspecified acute renal failure type [N17.9] Patient  Active Problem List   Diagnosis Date Noted   Generalized weakness 05/20/2024   Diarrhea 05/19/2024   Portal vein thrombosis 05/19/2024   Community acquired pneumonia of right lower lobe of lung 05/17/2024   Shock circulatory (HCC) 05/15/2024   ABLA (acute blood loss anemia) 03/08/2024   Acute on chronic diastolic CHF (congestive heart failure) (HCC) 03/08/2024   Hyperlipidemia 03/08/2024   Paroxysmal atrial fibrillation (HCC) 03/08/2024   Iron  deficiency anemia 07/17/2023   Symptomatic anemia 09/09/2022   Anemia 12/19/2021   Alcoholic cirrhosis of liver with ascites (HCC) 12/13/2021   Pre-diabetes 12/13/2021   OSA (obstructive sleep apnea) 12/13/2021   Hypertension 12/13/2021   Wound infection after surgery 12/12/2021   PAF (paroxysmal atrial fibrillation) (HCC)    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 11/26/2021   Peripheral arterial disease 11/26/2021   Chronic gouty arthritis 04/16/2019   Encounter for long-term (current) use of high-risk medication 04/16/2019   Stage 3b chronic kidney disease (CKD) (HCC) 03/05/2019   Hyperuricemia 03/05/2019   Cellulitis of left lower extremity 02/18/2019   Aortic atherosclerosis 04/10/2018   Class 3 obesity 04/10/2018   CAD (coronary artery disease), native coronary artery 04/08/2018   COPD (chronic obstructive pulmonary disease) (HCC) 04/08/2018   Acute renal failure 04/08/2018   Hx of adenomatous colonic polyps 08/29/2016   PCP:  Autry Grayce LABOR, PA Pharmacy:   [facility]  Mail [facility] - [place], [place] - [address] [place] [address] Phone: [phone] Fax: [phone]     Social Drivers of Health (SDOH) Social History: SDOH Screenings   Food Insecurity: No Food Insecurity (05/15/2024)  Housing: Low Risk  (05/15/2024)  Transportation Needs: No Transportation Needs (05/15/2024)  Utilities: Not At Risk (05/15/2024)  Depression (PHQ2-9): Low Risk  (03/14/2024)   Financial Resource Strain: Low Risk  (07/17/2023)  Social Connections: Moderately Isolated (05/15/2024)  Tobacco Use: Medium Risk (05/18/2024)   SDOH Interventions:     Readmission Risk Interventions    05/16/2024    1:46 PM 09/13/2022   11:09 AM 12/24/2021   12:03 PM  Readmission Risk Prevention Plan  Transportation Screening Complete Complete Complete  PCP or Specialist Appt within 5-7 Days   Complete  PCP or Specialist Appt within 3-5 Days Complete    Home Care Screening  Patient refused Complete  Medication Review (RN CM)  Complete Complete  Palliative Care Screening Not Applicable    Medication Review (RN Care Manager) Complete

## 2024-05-22 NOTE — Progress Notes (Signed)
 OT Cancellation Note  Patient Details Name: Kevin Gross MRN: [id number] DOB: 06-Sep-1955   Cancelled Treatment:    Reason Eval/Treat Not Completed: Fatigue/lethargy limiting ability to participate Pt received asleep in bed, states he has already walked 2x today and politely requests to nap. OT will check back as able.   Caius Silbernagel L. Jakyria Bleau, OTR/L  05/22/24, 4:17 PM

## 2024-05-22 NOTE — Progress Notes (Signed)
   05/22/24 1400  Mobility  Activity Ambulated with assistance;Stood at bedside  Level of Assistance Contact guard assist, steadying assist  Assistive Device Front wheel walker  Distance Ambulated (ft) 25 ft  Range of Motion/Exercises Active Assistive  Activity Response Tolerated fair  Mobility visit 1 Mobility  Mobility Specialist Start Time (ACUTE ONLY) 1421  Mobility Specialist Stop Time (ACUTE ONLY) 1446  Mobility Specialist Time Calculation (min) (ACUTE ONLY) 25 min   Mobility Specialist - Progress Note Pt was supine in bed upon entry. Pt agreed to mobility. Pt was able to EOB. Pt was able to STS with CGA. Pt was able to march in place for a set of 8.  Pt ambulated well with 2 [employer]. Pt did state lower leg fatigue. After activity pt returned to the bed for a moment. Pt requested to use the [facility]. Nurse entered the room. After activity pt returned to the bed with needs in reach.  Clem Rodes Mobility Specialist 05/22/24, 2:56 PM

## 2024-05-22 NOTE — Plan of Care (Signed)
  Problem: Education: Goal: Knowledge of General Education information will improve Description: Including pain rating scale, medication(s)/side effects and non-pharmacologic comfort measures Outcome: Progressing   Problem: Health Behavior/Discharge Planning: Goal: Ability to manage health-related needs will improve Outcome: Progressing   Problem: Clinical Measurements: Goal: Ability to maintain clinical measurements within normal limits will improve Outcome: Progressing   Problem: Activity: Goal: Risk for activity intolerance will decrease Outcome: Progressing   Problem: Nutrition: Goal: Adequate nutrition will be maintained Outcome: Progressing   Problem: Coping: Goal: Level of anxiety will decrease Outcome: Progressing   Problem: Elimination: Goal: Will not experience complications related to bowel motility Outcome: Progressing   Problem: Pain Managment: Goal: General experience of comfort will improve and/or be controlled Outcome: Progressing   Problem: Safety: Goal: Ability to remain free from injury will improve Outcome: Progressing   Problem: Skin Integrity: Goal: Risk for impaired skin integrity will decrease Outcome: Progressing   Problem: Fluid Volume: Goal: Fluid volume balance will be maintained or improved Outcome: Progressing   Problem: Nutritional: Goal: Ability to make appropriate dietary choices will improve Outcome: Progressing   Problem: Respiratory: Goal: Respiratory symptoms related to disease process will be avoided Outcome: Progressing   Problem: Urinary Elimination: Goal: Progression of disease will be identified and treated Outcome: Progressing

## 2024-05-22 NOTE — Discharge Instructions (Signed)
 SABRA

## 2024-05-22 NOTE — Progress Notes (Signed)
 Nutrition Follow-up  DOCUMENTATION CODES:   Obesity unspecified  INTERVENTION:   -Continue renal diet -Continue renal MVI daily -Nepro Shake po BID, each supplement provides 425 kcal and 19 grams protein  -D/c Ensure Plus High Protein po BID, each supplement provides 350 kcal and 20 grams of protein  -RD provided Nutrition for Dialysis handout from [facility]; attached to AVS/ discharge summary; pt will also have access to outpatient RD at HD center   NUTRITION DIAGNOSIS:   Inadequate oral intake related to acute illness as evidenced by per patient/family report.  Ongoing  GOAL:   Patient will meet greater than or equal to 90% of their needs  Progressing   MONITOR:   Diet advancement, Labs, Weight trends, I & O's, Skin  REASON FOR ASSESSMENT:   Malnutrition Screening Tool    ASSESSMENT:   68 y/o male with h/o COPD, PAD, CAD, etoh abuse, cirrhosis, OSA, CKD IV, IDA, HLD, CHF, PAF, prostate cancer and gout who is admitted with CHF, shock, bradycardia and AKI.  9/18- s/p lt paracentesis (4.8 L removed), first HD treatment 9/23- s/p permacath placement  Reviewed I/O's: +703 ml x 24 hours and +8.6 L since admission  Pt in with MD at time of visit.   Pt on a renal diet. Noted improved oral intake. Noted meal completions 25-100%.   Wt has been stable over the past week.   Per nephrology notes, plan for outpatient HD; awaiting outpatient chair. Per TOC notes, plan for home with home health services once medically stable.   Medications reviewed and include niferex, melatonin, midodrine , and thiamine .   Labs reviewed: Na: 134.    Diet Order:   Diet Order             Diet renal with fluid restriction Fluid consistency: Thin  Diet effective now                   EDUCATION NEEDS:   Education needs have been addressed  Skin:  Skin Assessment: Reviewed RN Assessment  Last BM:  05/22/24 (type 5)  Height:   Ht Readings from Last  1 Encounters:  05/15/24 5' 9 (1.753 m)    Weight:   Wt Readings from Last 1 Encounters:  05/21/24 114.6 kg    Ideal Body Weight:  72.7 kg  BMI:  Body mass index is 37.31 kg/m.  Estimated Nutritional Needs:   Kcal:  2300-2600kcal/day  Protein:  115-130g/day  Fluid:  1.9-2.2L/day    Margery ORN, RD, LDN, CDCES Registered Dietitian III Certified Diabetes Care and Education Specialist If unable to reach this RD, please use RD Inpatient group chat on secure chat between hours of 8am-4 pm daily

## 2024-05-22 NOTE — Progress Notes (Addendum)
 Progress Note    [name]  FMW:[id number] DOB: 02/08/1956  DOA: 05/15/2024 PCP: Autry Grayce LABOR, PA      Brief Narrative:    Medical records reviewed and are as summarized below:  Kevin Gross is a 68 y.o. male with medical history significant for hypertension, hyperlipidemia, CHF, CKD, iron  deficiency anemia, PUD, alcohol use disorder, liver cirrhosis, chronic bradycardia, who presented to the hospital with shortness of breath, general weakness, poor oral intake, nausea, vomiting and diarrhea.  Patient reported abstinence from alcohol and tobacco use.  He was bradycardic and hypotensive on admission.  He has chronic bradycardia.  He was given IV fluids without any response in blood pressure.  He was subsequently started on vasopressors and admitted to the ICU.  Septic shock was ruled out.  He was thought to have circulatory shock secondary to decompensated liver cirrhosis.  No growth on blood cultures of ascitic fluid.  He had paracentesis with removal of 4.8 L of fluid on 05/16/2024.  Liver ultrasound showed portal vein thrombosis.  He treated with IV vitamin K .  He was also treated with IV heparin  drip, IV octreotide  infusion and IV heparin  drip.  Gastroenterologist recommended anticoagulation for at least 6 months with cross-sectional imaging every 3 months to assess response.  Outpatient colonoscopy was recommended as well with GI signed off the case.  He was also started on hemodialysis for ESRD.  He was transferred to [facility] on 05/19/2024.   Assessment/Plan:   Principal Problem:   Shock circulatory (HCC) Active Problems:   Acute renal failure   Community acquired pneumonia of right lower lobe of lung   Alcoholic cirrhosis of liver with ascites (HCC)   Portal vein thrombosis   Peripheral arterial disease   Hypertension   Diarrhea   Anemia   Hyperlipidemia   Generalized weakness   Nutrition Problem: Inadequate oral intake Etiology:  acute illness  Signs/Symptoms: per patient/family report   Body mass index is 37.31 kg/m.  (Class II obesity)   Circulatory shock: Exact etiology unclear but volume depletion and decompensated liver cirrhosis suspected.  Sepsis ruled out. He is off of IV vasopressors.  Continue midodrine .   Leukocytosis: WBC at 15.6.  Etiology unclear.  Monitor CBC. Previously treated with antibiotics.   AKI on CKD stage IV likely with progression to ESRD: Initiated on hemodialysis on this admission. S/p placement of right IJ tunneled dialysis catheter on 05/21/2024. Awaiting confirmation for outpatient hemodialysis chair.  Follow-up with nephrologist.   Left-sided pleural effusion with lower lobe atelectasis: This is probably from liver cirrhosis.  Of note, patient was treated empirically with IV antibiotics for suspected pneumonia.   Decompensated liver cirrhosis: S/p treatment with IV octreotide  infusion. Portal vein thrombosis on liver ultrasound: S/p treatment with IV heparin  drip.  Continue Eliquis .  At least 6 months of anticoagulation recommended.  Repeat imaging in 3 months to assess response.   Diarrhea: Improved.  Stool for C. difficile antigen positive but PCR and toxin were negative.  GI panel was negative.  Initially treated with oral vancomycin  (up to 5 days).   General weakness: PT recommended home health therapy.   PAD: Aspirin  and Plavix  on hold since he is on Eliquis .  Likely resume aspirin  at discharge.   Mild hypokalemia: Improved Metabolic acidosis: Improved Mild hyponatremia: Asymptomatic.   Comorbidities include history of alcohol use disorder, hyperlipidemia, chronic anemia     Diet Order  Diet renal with fluid restriction Fluid consistency: Thin  Diet effective now                                  Consultants: Nephrologist Vascular surgeon Gastroenterology Intensivist  Procedures: Right IJ tunneled dialysis catheter  placement on 05/21/2024    Medications:    apixaban   10 mg Oral BID   Followed by   NOREEN ON 05/28/2024] apixaban   5 mg Oral BID   Chlorhexidine  Gluconate Cloth  6 each Topical Q0600   ezetimibe   10 mg Oral Daily   feeding supplement  237 mL Oral TID BM   iron  polysaccharides  150 mg Oral BID   melatonin  5 mg Oral QHS   midodrine   10 mg Oral TID WC   multivitamin  1 tablet Oral QHS   multivitamin with minerals  1 tablet Oral Daily   pantoprazole   40 mg Oral Daily   simvastatin   20 mg Oral QHS   thiamine   100 mg Oral Daily   Continuous Infusions:   Anti-infectives (From admission, onward)    Start     Dose/Rate Route Frequency Ordered Stop   05/21/24 1700  ceFAZolin  (ANCEF ) IVPB 1 g/50 mL premix        1 g 100 mL/hr over 30 Minutes Intravenous 30 min pre-op 05/21/24 1410 05/21/24 1708   05/17/24 1945  vancomycin  (VANCOCIN ) capsule 125 mg        125 mg Oral 4 times daily 05/17/24 1850 05/21/24 2102   05/16/24 2200  cefTRIAXone  (ROCEPHIN ) 2 g in sodium chloride  0.9 % 100 mL IVPB        2 g 200 mL/hr over 30 Minutes Intravenous Every 24 hours 05/16/24 0705 05/20/24 2146   05/16/24 1200  azithromycin  (ZITHROMAX ) 500 mg in sodium chloride  0.9 % 250 mL IVPB        500 mg 250 mL/hr over 60 Minutes Intravenous Every 24 hours 05/16/24 1029 05/20/24 1426   05/16/24 0030  cefTRIAXone  (ROCEPHIN ) 2 g in sodium chloride  0.9 % 100 mL IVPB        2 g 200 mL/hr over 30 Minutes Intravenous  Once 05/15/24 2333 05/16/24 0022              Family Communication/Anticipated D/C date and plan/Code Status   DVT prophylaxis: SCDs Start: 05/15/24 1940 apixaban  (ELIQUIS ) tablet 10 mg  apixaban  (ELIQUIS ) tablet 5 mg     Code Status: Full Code  Family Communication: None Disposition Plan: Plan to discharge home   Status is: Inpatient Remains inpatient appropriate because: Awaiting outpatient hemodialysis chair       Subjective:   Interval events noted.  He has no complaints.   He feels better today.  Objective:    Vitals:   05/21/24 2057 05/21/24 2125 05/22/24 0235 05/22/24 0722  BP: (!) 81/49 (!) 126/105 (!) 103/58 (!) 101/56  Pulse: (!) 47 (!) 49 (!) 51 (!) 46  Resp: 18 16 16    Temp: 97.7 F (36.5 C) 97.7 F (36.5 C) 98.3 F (36.8 C) 99.1 F (37.3 C)  TempSrc:    Oral  SpO2: 94% 95% 95%   Weight:      Height:       No data found.   Intake/Output Summary (Last 24 hours) at 05/22/2024 0928 Last data filed at 05/21/2024 2145 Gross per 24 hour  Intake 862.98 ml  Output 400 ml  Net 462.98 ml  Filed Weights   05/20/24 0500 05/21/24 0900 05/21/24 1314  Weight: 113.4 kg 115 kg 114.6 kg    Exam:  GEN: NAD SKIN: Warm and dry EYES: No pallor or icterus ENT: MMM CV: RRR PULM: CTA B ABD: soft, ND, NT, +BS CNS: AAO x 3, non focal EXT: B/l upper and lower extremity edema, erythema left distal forearm, no tenderness       Data Reviewed:   I have personally reviewed following labs and imaging studies:  Labs: Labs show the following:   Basic Metabolic Panel: Recent Labs  Lab 05/16/24 0441 05/16/24 1128 05/17/24 0544 05/18/24 0447 05/19/24 0435 05/20/24 0313 05/21/24 0456  NA 143   < > 142 140 136 139 135  K 4.8   < > 4.1 3.8 3.6 3.8 3.4*  CL 117*   < > 106 101 100 102 101  CO2 16*   < > 20* 22 26 26 22   GLUCOSE 104*   < > 121* 112* 110* 114* 120*  BUN 115*   < > 91* 64* 47* 58* 74*  CREATININE 10.89*   < > 7.87* 5.73* 4.65* 5.32* 6.09*  CALCIUM  7.6*   < > 7.8* 8.0* 7.7* 7.8* 7.9*  MG 2.5*  --  2.0 1.9 1.8 1.7 2.2  PHOS 8.5*  --  6.3* 4.5  --   --   --    < > = values in this interval not displayed.   GFR Estimated Creatinine Clearance: 14.9 mL/min (A) (by C-G formula based on SCr of 6.09 mg/dL (H)). Liver Function Tests: Recent Labs  Lab 05/15/24 1528 05/16/24 0441 05/17/24 0544 05/21/24 0456  AST 98* 95* 95* 59*  ALT 49* 47* 41 31  ALKPHOS 187* 183* 128* 97  BILITOT 1.2 1.3* 1.7* 1.3*  PROT 6.1* 5.8* 5.8* 5.2*   ALBUMIN  2.3* 2.2* 3.2* 2.4*   No results for input(s): LIPASE, AMYLASE in the last 168 hours. Recent Labs  Lab 05/16/24 1128  AMMONIA 67*   Coagulation profile Recent Labs  Lab 05/16/24 1658 05/18/24 1056 05/19/24 0435 05/20/24 0313 05/21/24 0456  INR 1.4* 1.5* 1.6* 2.0* 2.5*    CBC: Recent Labs  Lab 05/17/24 0544 05/18/24 0447 05/19/24 0435 05/20/24 0313 05/21/24 0456  WBC 10.9* 9.4 10.6* 10.4 15.5*  HGB 9.1* 9.1* 8.9* 10.1* 10.6*  HCT 27.2* 27.8* 27.6* 31.0* 32.7*  MCV 96.8 97.9 99.3 100.6* 100.9*  PLT 123* 108* 104* 114* 146*   Cardiac Enzymes: No results for input(s): CKTOTAL, CKMB, CKMBINDEX, TROPONINI in the last 168 hours. BNP (last 3 results) No results for input(s): PROBNP in the last 8760 hours. CBG: Recent Labs  Lab 05/15/24 2047 05/16/24 1605  GLUCAP 80 120*   D-Dimer: No results for input(s): DDIMER in the last 72 hours. Hgb A1c: No results for input(s): HGBA1C in the last 72 hours. Lipid Profile: No results for input(s): CHOL, HDL, LDLCALC, TRIG, CHOLHDL, LDLDIRECT in the last 72 hours. Thyroid function studies: No results for input(s): TSH, T4TOTAL, T3FREE, THYROIDAB in the last 72 hours.  Invalid input(s): FREET3 Anemia work up: No results for input(s): VITAMINB12, FOLATE, FERRITIN, TIBC, IRON , RETICCTPCT in the last 72 hours. Sepsis Labs: Recent Labs  Lab 05/15/24 2150 05/16/24 0441 05/16/24 1056 05/17/24 0544 05/18/24 0447 05/19/24 0435 05/20/24 0313 05/21/24 0456  WBC  --    < >  --    < > 9.4 10.6* 10.4 15.5*  LATICACIDVEN 1.5  --  1.4  --   --   --   --   --    < > =  values in this interval not displayed.    Microbiology Recent Results (from the past 240 hours)  MRSA Next Gen by PCR, Nasal     Status: None   Collection Time: 05/15/24  8:57 PM   Specimen: Nasal Mucosa; Nasal Swab  Result Value Ref Range Status   MRSA by PCR Next Gen NOT DETECTED NOT DETECTED Final     Comment: (NOTE) The GeneXpert MRSA Assay (FDA approved for NASAL specimens only), is one component of a comprehensive MRSA colonization surveillance program. It is not intended to diagnose MRSA infection nor to guide or monitor treatment for MRSA infections. Test performance is not FDA approved in patients less than 60 years old. Performed at [facility], [address] [place], [place] [address]   SARS Coronavirus 2 by RT PCR (hospital order, performed in [facility] hospital lab) *cepheid single result test* Anterior Nasal Swab     Status: None   Collection Time: 05/15/24  8:58 PM   Specimen: Anterior Nasal Swab  Result Value Ref Range Status   SARS Coronavirus 2 by RT PCR NEGATIVE NEGATIVE Final    Comment: (NOTE) SARS-CoV-2 target nucleic acids are NOT DETECTED.  The SARS-CoV-2 RNA is generally detectable in upper and lower respiratory specimens during the acute phase of infection. The lowest concentration of SARS-CoV-2 viral copies this assay can detect is 250 copies / mL. A negative result does not preclude SARS-CoV-2 infection and should not be used as the sole basis for treatment or other patient management decisions.  A negative result may occur with improper specimen collection / handling, submission of specimen other than nasopharyngeal swab, presence of viral mutation(s) within the areas targeted by this assay, and inadequate number of viral copies (<250 copies / mL). A negative result must be combined with clinical observations, patient history, and epidemiological information.  Fact Sheet for Patients:   RoadLapTop.co.za  Fact Sheet for Healthcare Providers: http://kim-miller.com/  This test is not yet approved or  cleared by the [place]  FDA and has been authorized for detection and/or diagnosis of SARS-CoV-2 by FDA under an Emergency Use Authorization (EUA).  This EUA will remain in effect (meaning  this test can be used) for the duration of the COVID-19 declaration under Section 564(b)(1) of the Act, 21 U.S.C. section 360bbb-3(b)(1), unless the authorization is terminated or revoked sooner.  Performed at [facility], [address] [place], [place] [address]   Body fluid culture w Gram Stain     Status: None   Collection Time: 05/16/24  2:50 PM   Specimen: PATH Cytology Peritoneal fluid  Result Value Ref Range Status   Specimen Description   Final    PERITONEAL Performed at [facility], [address], [place], [place] [address]    Special Requests   Final    NONE Performed at [facility], [address] [place], [place] [address]    Gram Stain   Final    WBC PRESENT, PREDOMINANTLY MONONUCLEAR NO ORGANISMS SEEN CYTOSPIN SMEAR    Culture   Final    NO GROWTH 3 DAYS Performed at [facility], [address], [place], [place] [address]    Report Status 05/20/2024 FINAL  Final  Culture, blood (Routine X 2) w Reflex to ID Panel     Status: None   Collection Time: 05/16/24  6:15 PM   Specimen: BLOOD  Result Value Ref Range Status   Specimen Description BLOOD BLOOD RIGHT ARM  Final  Special Requests   Final    BOTTLES DRAWN AEROBIC AND ANAEROBIC Blood Culture adequate volume   Culture   Final    NO GROWTH 5 DAYS Performed at [facility], [address] [place], [place] [address]    Report Status 05/21/2024 FINAL  Final  Culture, blood (Routine X 2) w Reflex to ID Panel     Status: None   Collection Time: 05/16/24  6:28 PM   Specimen: BLOOD  Result Value Ref Range Status   Specimen Description BLOOD BLOOD RIGHT ARM  Final   Special Requests   Final    BOTTLES DRAWN AEROBIC AND ANAEROBIC Blood Culture adequate volume   Culture   Final    NO GROWTH 5 DAYS Performed at [facility], [address] [place], [place] [address]    Report Status 05/21/2024 FINAL  Final  Gastrointestinal Panel by PCR , Stool      Status: None   Collection Time: 05/17/24  3:20 AM   Specimen: Stool  Result Value Ref Range Status   Campylobacter species NOT DETECTED NOT DETECTED Final   Plesimonas shigelloides NOT DETECTED NOT DETECTED Final   Salmonella species NOT DETECTED NOT DETECTED Final   Yersinia enterocolitica NOT DETECTED NOT DETECTED Final   Vibrio species NOT DETECTED NOT DETECTED Final   Vibrio cholerae NOT DETECTED NOT DETECTED Final   Enteroaggregative E coli (EAEC) NOT DETECTED NOT DETECTED Final   Enteropathogenic E coli (EPEC) NOT DETECTED NOT DETECTED Final   Enterotoxigenic E coli (ETEC) NOT DETECTED NOT DETECTED Final   Shiga like toxin producing E coli (STEC) NOT DETECTED NOT DETECTED Final   Shigella/Enteroinvasive E coli (EIEC) NOT DETECTED NOT DETECTED Final   Cryptosporidium NOT DETECTED NOT DETECTED Final   Cyclospora cayetanensis NOT DETECTED NOT DETECTED Final   Entamoeba histolytica NOT DETECTED NOT DETECTED Final   Giardia lamblia NOT DETECTED NOT DETECTED Final   Adenovirus F40/41 NOT DETECTED NOT DETECTED Final   Astrovirus NOT DETECTED NOT DETECTED Final   Norovirus GI/GII NOT DETECTED NOT DETECTED Final   Rotavirus A NOT DETECTED NOT DETECTED Final   Sapovirus (I, II, IV, and V) NOT DETECTED NOT DETECTED Final    Comment: Performed at [facility], [address] [place], [place] [address]  C Difficile Quick Screen w PCR reflex     Status: Abnormal   Collection Time: 05/17/24  4:34 PM   Specimen: STOOL  Result Value Ref Range Status   C Diff antigen POSITIVE (A) NEGATIVE Final   C Diff toxin NEGATIVE NEGATIVE Final   C Diff interpretation Results are indeterminate. See PCR results.  Final    Comment: Performed at [facility], [address] [place], [place] [address]  C. Diff by PCR, Reflexed     Status: None   Collection Time: 05/17/24  4:34 PM  Result Value Ref Range Status   Toxigenic C. Difficile by PCR NEGATIVE NEGATIVE Final    Comment:  Patient is colonized with non toxigenic C. difficile. May not need treatment unless significant symptoms are present.   Hypervirulent Strain PRESUMPTIVE NEGATIVE PRESUMPTIVE NEGATIVE Final    Comment: Performed at [facility], [address] [place], [place] [address]    Procedures and diagnostic studies:  PERIPHERAL VASCULAR CATHETERIZATION Result Date: 05/21/2024 See surgical note for result.              LOS: 7 days   Atlas Kuc  [facility]   Pager on [email or web address].  If 7PM-7AM, please contact night-coverage at [email or web address]     05/22/2024, 9:28 AM

## 2024-05-22 NOTE — Progress Notes (Signed)
 Physical Therapy Treatment Patient Details Name: Kevin Gross MRN: [id number] DOB: [birth year] Today's Date: 05/22/2024   History of Present Illness Pt is a 68 yo male presenting to [facility] ED from home for evaluation of SOB and progressive weakness. Initially admitted to ICU on pressors for circulatory shock, acute renal failure, CAP and portal vein thrombosis. PMH: alcoholic cirrhosis of liver, CHF, COPD, HLD, HTN, stage III CKD, gout, anemia, PVD, OSA on CPAP.    PT Comments  Patient received in bed, he is very pleasant and agreeable to PT session. Patient reports he is feeling better. He is mod I with bed mobility and transfers. He ambulated 40 feet with RW and supervision. Required 1 seated rest during ambulation due to fatigue. Patient will continue to benefit from skilled PT to improve functional independence and safety.    If plan is discharge home, recommend the following: A little help with walking and/or transfers;A little help with bathing/dressing/bathroom;Help with stairs or ramp for entrance;Assistance with cooking/housework   Can travel by private vehicle      yes  Equipment Recommendations  None recommended by PT    Recommendations for Other Services       Precautions / Restrictions Precautions Precautions: Fall Recall of Precautions/Restrictions: Intact Restrictions Weight Bearing Restrictions Per Provider Order: No     Mobility  Bed Mobility Overal bed mobility: Modified Independent Bed Mobility: Supine to Sit     Supine to sit: Used rails, HOB elevated, Supervision          Transfers Overall transfer level: Modified independent Equipment used: Rolling walker (2 wheels) Transfers: Sit to/from Stand Sit to Stand: Modified independent (Device/Increase time)           General transfer comment: no assist needed standing from low bed height    Ambulation/Gait Ambulation/Gait assistance: Supervision Gait Distance (Feet): 40 Feet Assistive device:  Rolling walker (2 wheels) Gait Pattern/deviations: Step-through pattern, Decreased step length - right, Decreased step length - left Gait velocity: [facility]     General Gait Details: patient with improved activity tolerance this session. 1 seated rest during ambulation.   Stairs             Wheelchair Mobility     Tilt Bed    Modified Rankin (Stroke Patients Only)       Balance Overall balance assessment: Modified Independent Sitting-balance support: Feet supported Sitting balance-Leahy Scale: Normal     Standing balance support: Bilateral upper extremity supported, During functional activity, Reliant on assistive device for balance Standing balance-Leahy Scale: Good Standing balance comment: Able to maintain standing balance using RW. Reliant on RW for support                            Communication Communication Communication: No apparent difficulties  Cognition Arousal: Alert Behavior During Therapy: WFL for tasks assessed/performed   PT - Cognitive impairments: No apparent impairments                       PT - Cognition Comments: pleasant and agreeable to PT session Following commands: Intact      Cueing Cueing Techniques: Verbal cues  Exercises Other Exercises Other Exercises: Educated patient on seated LE exercises LAQ and seated marching.    General Comments        Pertinent Vitals/Pain Pain Assessment Pain Assessment: No/denies pain    Home Living  Prior Function            PT Goals (current goals can now be found in the care plan section) Acute Rehab PT Goals Patient Stated Goal: to move around and go home PT Goal Formulation: With patient Time For Goal Achievement: 06/03/24 Potential to Achieve Goals: Good Progress towards PT goals: Progressing toward goals    Frequency    Min 2X/week      PT Plan      Co-evaluation              AM-PAC PT 6 Clicks Mobility    Outcome Measure  Help needed turning from your back to your side while in a flat bed without using bedrails?: None Help needed moving from lying on your back to sitting on the side of a flat bed without using bedrails?: None Help needed moving to and from a bed to a chair (including a wheelchair)?: A Little Help needed standing up from a chair using your arms (e.g., wheelchair or bedside chair)?: None Help needed to walk in hospital room?: A Little Help needed climbing 3-5 steps with a railing? : A Lot 6 Click Score: 20    End of Session   Activity Tolerance: Patient tolerated treatment well Patient left: in chair;with chair alarm set;with call bell/phone within reach Nurse Communication: Mobility status PT Visit Diagnosis: Muscle weakness (generalized) (M62.81);Difficulty in walking, not elsewhere classified (R26.2)     Time: 8961-8942 PT Time Calculation (min) (ACUTE ONLY): 19 min  Charges:    $Therapeutic Activity: 8-22 mins PT General Charges $$ ACUTE PT VISIT: 1 Visit                     Maiyah Goyne, PT, GCS 05/22/24,11:02 AM

## 2024-05-22 NOTE — Progress Notes (Signed)
  Progress Note    05/22/2024 11:21 AM 1 Day Post-Op  Subjective:  Kevin Gross is a 68 yo male now POD #1 from:  PROCEDURE: Insertion of tunneled dialysis catheter right IJ approach with ultrasound and fluoroscopic guidance.  Patient resting comfortably in bed this morning.  Patient endorses soreness to right chest where catheter was placed.  Dressing is clean dry tact.  No complaints overnight vitals are   Vitals:   05/22/24 0722 05/22/24 1000  BP: (!) 101/56 (!) 100/50  Pulse: (!) 46 (!) 48  Resp:  16  Temp: 99.1 F (37.3 C) 99.3 F (37.4 C)  SpO2:  97%   Physical Exam: Cardiac:  RRR, normal S1 and S2.  No murmurs. Lungs: Nonlabored breathing, clear to auscultation but diminished in the bases with slight rales.  No rhonchi or wheezing noted. Incisions: Right chest incision with new dialysis permacatheter placement.  Dressings clean dry and intact.  No hematoma seroma noted. Extremities: Palpable pulses throughout.  +2 lower extremity edema with +1 lower extremity edema.  All extremities are warm to touch. Abdomen: Positive bowel sounds throughout, soft, nontender nondistended. Neurologic: Alert and oriented x 3 answers questions and follows commands.  CBC    Component Value Date/Time   WBC 15.5 (H) 05/21/2024 0456   RBC 3.24 (L) 05/21/2024 0456   HGB 10.6 (L) 05/21/2024 0456   HGB 13.3 10/12/2021 1314   HCT 32.7 (L) 05/21/2024 0456   HCT 39.4 10/12/2021 1314   PLT 146 (L) 05/21/2024 0456   PLT 175 10/12/2021 1314   MCV 100.9 (H) 05/21/2024 0456   MCV 97 10/12/2021 1314   MCH 32.7 05/21/2024 0456   MCHC 32.4 05/21/2024 0456   RDW 22.8 (H) 05/21/2024 0456   RDW 13.1 10/12/2021 1314   LYMPHSABS 0.7 04/15/2024 1328   LYMPHSABS 1.9 10/12/2021 1314   MONOABS 0.6 04/15/2024 1328   EOSABS 0.1 04/15/2024 1328   EOSABS 0.7 (H) 10/12/2021 1314   BASOSABS 0.0 04/15/2024 1328   BASOSABS 0.1 10/12/2021 1314    BMET    Component Value Date/Time   NA 135 05/21/2024  0456   NA 145 (H) 10/12/2021 1314   K 3.4 (L) 05/21/2024 0456   CL 101 05/21/2024 0456   CO2 22 05/21/2024 0456   GLUCOSE 120 (H) 05/21/2024 0456   BUN 74 (H) 05/21/2024 0456   BUN 37 (H) 10/12/2021 1314   CREATININE 6.09 (H) 05/21/2024 0456   CALCIUM  7.9 (L) 05/21/2024 0456   GFRNONAA 9 (L) 05/21/2024 0456   GFRAA 56 (L) 10/26/2019 0551    INR    Component Value Date/Time   INR 2.5 (H) 05/21/2024 0456     Intake/Output Summary (Last 24 hours) at 05/22/2024 1121 Last data filed at 05/22/2024 0235 Gross per 24 hour  Intake 1102.98 ml  Output 400 ml  Net 702.98 ml     Assessment/Plan:  68 y.o. male is s/p SEE ABOVE 1 Day Post-Op   PLAN Okay per vascular surgery for hemodialysis to use dialysis permacatheter that was placed yesterday.  No complications to note.  Vitals are remained stable. Vascular surgery to sign off at this time.  DVT prophylaxis: Heparin  with dialysis   Gwendlyn JONELLE Shank [facility] 05/22/2024 11:21 AM

## 2024-05-23 DIAGNOSIS — R579 Shock, unspecified: Secondary | ICD-10-CM | POA: Diagnosis not present

## 2024-05-23 LAB — CBC
HCT: 32.6 % — ABNORMAL LOW (ref 39.0–52.0)
Hemoglobin: 10.5 g/dL — ABNORMAL LOW (ref 13.0–17.0)
MCH: 32.8 pg (ref 26.0–34.0)
MCHC: 32.2 g/dL (ref 30.0–36.0)
MCV: 101.9 fL — ABNORMAL HIGH (ref 80.0–100.0)
Platelets: 121 K/uL — ABNORMAL LOW (ref 150–400)
RBC: 3.2 MIL/uL — ABNORMAL LOW (ref 4.22–5.81)
RDW: 23.2 % — ABNORMAL HIGH (ref 11.5–15.5)
WBC: 14.2 K/uL — ABNORMAL HIGH (ref 4.0–10.5)
nRBC: 0 % (ref 0.0–0.2)

## 2024-05-23 LAB — BASIC METABOLIC PANEL WITH GFR
Anion gap: 12 (ref 5–15)
BUN: 67 mg/dL — ABNORMAL HIGH (ref 8–23)
CO2: 24 mmol/L (ref 22–32)
Calcium: 8 mg/dL — ABNORMAL LOW (ref 8.9–10.3)
Chloride: 100 mmol/L (ref 98–111)
Creatinine, Ser: 6.82 mg/dL — ABNORMAL HIGH (ref 0.61–1.24)
GFR, Estimated: 8 mL/min — ABNORMAL LOW (ref >60)
Glucose, Bld: 117 mg/dL — ABNORMAL HIGH (ref 70–99)
Potassium: 4.4 mmol/L (ref 3.5–5.1)
Sodium: 136 mmol/L (ref 135–145)

## 2024-05-23 LAB — PHOSPHORUS: Phosphorus: 4.8 mg/dL — ABNORMAL HIGH (ref 2.5–4.6)

## 2024-05-23 MED ORDER — ALBUMIN HUMAN 25 % IV SOLN
INTRAVENOUS | Status: AC
  Filled 2024-05-23: qty 50

## 2024-05-23 MED ORDER — ALTEPLASE 2 MG IJ SOLR: 2.0000 mg | Freq: Once | INTRAMUSCULAR | Status: DC | PRN

## 2024-05-23 MED ORDER — HEPARIN SODIUM (PORCINE) 1000 UNIT/ML DIALYSIS: 1000.0000 [IU] | INTRAMUSCULAR | Status: DC | PRN

## 2024-05-23 MED ORDER — ONDANSETRON HCL 4 MG/2ML IJ SOLN
4.0000 mg | Freq: Four times a day (QID) | INTRAMUSCULAR | Status: DC | PRN
  Administered 2024-05-23: 4 mg via INTRAVENOUS
  Filled 2024-05-23: qty 2

## 2024-05-23 MED ORDER — ALBUMIN HUMAN 25 % IV SOLN
25.0000 g | Freq: Once | INTRAVENOUS | Status: AC
  Administered 2024-05-23: 25 g via INTRAVENOUS

## 2024-05-23 MED ORDER — HEPARIN SODIUM (PORCINE) 1000 UNIT/ML IJ SOLN
INTRAMUSCULAR | Status: AC
  Filled 2024-05-23: qty 4

## 2024-05-23 MED ORDER — MIDODRINE HCL 5 MG PO TABS
ORAL_TABLET | ORAL | Status: AC
  Filled 2024-05-23: qty 2

## 2024-05-23 NOTE — Progress Notes (Addendum)
 Progress Note    Kevin Gross  FMW:[id number] DOB: 1955/09/07  DOA: 05/15/2024 PCP: Autry Grayce LABOR, PA      Brief Narrative:    Medical records reviewed and are as summarized below:  Kevin Gross is a 68 y.o. male with medical history significant for hypertension, hyperlipidemia, CHF, CKD, iron  deficiency anemia, PUD, alcohol use disorder, liver cirrhosis, chronic bradycardia, who presented to the hospital with shortness of breath, general weakness, poor oral intake, nausea, vomiting and diarrhea.  Patient reported abstinence from alcohol and tobacco use.  He was bradycardic and hypotensive on admission.  He has chronic bradycardia.  He was given IV fluids without any response in blood pressure.  He was subsequently started on vasopressors and admitted to the ICU.  Septic shock was ruled out.  He was thought to have circulatory shock secondary to decompensated liver cirrhosis.  No growth on blood cultures of ascitic fluid.  He had paracentesis with removal of 4.8 L of fluid on 05/16/2024.  Liver ultrasound showed portal vein thrombosis.  He treated with IV vitamin K .  He was also treated with IV heparin  drip, IV octreotide  infusion and IV heparin  drip.  Gastroenterologist recommended anticoagulation for at least 6 months with cross-sectional imaging every 3 months to assess response.  Outpatient colonoscopy was recommended as well with GI signed off the case.  He was also started on hemodialysis for ESRD.  [facility] was [facility] on 05/19/2024.   Assessment/Plan:   Principal Problem:   Shock circulatory (HCC) Active Problems:   Acute renal failure   Community acquired pneumonia of right lower lobe of lung   Alcoholic cirrhosis of liver with ascites (HCC)   Portal vein thrombosis   Peripheral arterial disease   Hypertension   Diarrhea   Anemia   Hyperlipidemia   Generalized weakness   Encounter for dialysis and dialysis catheter  care   Nutrition Problem: Inadequate oral intake Etiology: acute illness  Signs/Symptoms: per patient/family report   Body mass index is 37.24 kg/m.  (Class II obesity)   Circulatory shock: Exact etiology unclear but volume depletion and decompensated liver cirrhosis suspected.  Sepsis ruled out. He is off of IV vasopressors.  Continue midodrine .   Leukocytosis: Slowly improving, 15.6-14.2.  Etiology unclear.  Monitor CBC. Previously treated with antibiotics.   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 S/p placement of right IJ tunneled dialysis catheter on 05/21/2024. He underwent hemodialysis today.  Case discussed with Dr. Marcelino, nephrologist.  He wants patient to have another session of hemodialysis tomorrow prior to discharge. Outpatient hemodialysis chair has been confirmed at [facility] at 11 AM.   Left-sided pleural effusion with lower lobe atelectasis: This is probably from liver cirrhosis.  Of note, patient was treated empirically with IV antibiotics for suspected pneumonia.   Decompensated liver cirrhosis: S/p treatment with IV octreotide  infusion. Portal vein thrombosis on liver ultrasound: S/p treatment with IV heparin  drip.  Continue Eliquis .  At least 6 months of anticoagulation recommended.  Repeat imaging in 3 months to assess response.   Diarrhea: Improved.  Stool for C. difficile antigen positive but PCR and toxin were negative.  GI panel was negative.  Initially treated with oral vancomycin  (up to 5 days).   General weakness: PT recommended home health therapy.   PAD: Aspirin  and Plavix  on hold since he is on Eliquis .  Likely resume aspirin  at discharge.   Thrombocytopenia:  Platelet count is stable.   Mild hypokalemia: Improved Metabolic acidosis: Improved Mild hyponatremia: Asymptomatic.   Comorbidities include history of alcohol use disorder, hyperlipidemia, chronic  anemia     Diet Order             Diet renal with fluid restriction Fluid consistency: Thin  Diet effective now                                  Consultants: Nephrologist Vascular surgeon Gastroenterology Intensivist  Procedures: Right IJ tunneled dialysis catheter placement on 05/21/2024    Medications:    apixaban   10 mg Oral BID   Followed by   NOREEN ON 05/28/2024] apixaban   5 mg Oral BID   Chlorhexidine  Gluconate Cloth  6 each Topical Q0600   ezetimibe   10 mg Oral Daily   feeding supplement (NEPRO CARB STEADY)  237 mL Oral BID BM   iron  polysaccharides  150 mg Oral BID   melatonin  5 mg Oral QHS   midodrine   10 mg Oral TID WC   multivitamin  1 tablet Oral QHS   pantoprazole   40 mg Oral Daily   simvastatin   20 mg Oral QHS   thiamine   100 mg Oral Daily   Continuous Infusions:   Anti-infectives (From admission, onward)    Start     Dose/Rate Route Frequency Ordered Stop   05/21/24 1700  ceFAZolin  (ANCEF ) IVPB 1 g/50 mL premix        1 g 100 mL/hr over 30 Minutes Intravenous 30 min pre-op 05/21/24 1410 05/21/24 1708   05/17/24 1945  vancomycin  (VANCOCIN ) capsule 125 mg        125 mg Oral 4 times daily 05/17/24 1850 05/21/24 2102   05/16/24 2200  cefTRIAXone  (ROCEPHIN ) 2 g in sodium chloride  0.9 % 100 mL IVPB        2 g 200 mL/hr over 30 Minutes Intravenous Every 24 hours 05/16/24 0705 05/20/24 2146   05/16/24 1200  azithromycin  (ZITHROMAX ) 500 mg in sodium chloride  0.9 % 250 mL IVPB        500 mg 250 mL/hr over 60 Minutes Intravenous Every 24 hours 05/16/24 1029 05/20/24 1426   05/16/24 0030  cefTRIAXone  (ROCEPHIN ) 2 g in sodium chloride  0.9 % 100 mL IVPB        2 g 200 mL/hr over 30 Minutes Intravenous  Once 05/15/24 2333 05/16/24 0022              Family Communication/Anticipated D/C date and plan/Code Status   DVT prophylaxis: SCDs Start: 05/15/24 1940 apixaban  (ELIQUIS ) tablet 10 mg  apixaban  (ELIQUIS ) tablet 5 mg      Code Status: Full Code  Family Communication: None Disposition Plan: Plan to discharge home   Status is: Inpatient Remains inpatient appropriate because: Nephrologist wants him to have hemodialysis tomorrow prior to discharge.         Subjective:   No complaints.  No shortness of breath or chest pain.  He had hemodialysis today.  Objective:    Vitals:   05/23/24 1145 05/23/24 1211 05/23/24 1236 05/23/24 1419  BP: (!) 89/47 (!) 81/48 (!) 91/50 131/69  Pulse: (!) 46 (!) 50 (!) 49 63  Resp: 11 14 13 14   Temp:   (!) 97.5 F (36.4 C) 98.4 F (36.9 C)  TempSrc:      SpO2:    94%  Weight:   114.4 kg  Height:       No data found.   Intake/Output Summary (Last 24 hours) at 05/23/2024 1552 Last data filed at 05/23/2024 1236 Gross per 24 hour  Intake 240 ml  Output 1050 ml  Net -810 ml   Filed Weights   05/23/24 0605 05/23/24 0837 05/23/24 1236  Weight: 114.5 kg 115.2 kg 114.4 kg    Exam:  GEN: NAD SKIN: Warm and dry EYES: No pallor or icterus ENT: MMM CV: RRR PULM: CTA B ABD: soft, obese, NT, +BS CNS: AAO x 3, non focal EXT: Swelling of bilateral upper and lower extremities.  No tenderness       Data Reviewed:   I have personally reviewed following labs and imaging studies:  Labs: Labs show the following:   Basic Metabolic Panel: Recent Labs  Lab 05/17/24 0544 05/18/24 0447 05/19/24 0435 05/20/24 0313 05/21/24 0456 05/22/24 1129 05/23/24 0645  NA 142 140 136 139 135 134* 136  K 4.1 3.8 3.6 3.8 3.4* 3.5 4.4  CL 106 101 100 102 101 100 100  CO2 20* 22 26 26 22  21* 24  GLUCOSE 121* 112* 110* 114* 120* 130* 117*  BUN 91* 64* 47* 58* 74* 63* 67*  CREATININE 7.87* 5.73* 4.65* 5.32* 6.09* 5.37* 6.82*  CALCIUM  7.8* 8.0* 7.7* 7.8* 7.9* 8.0* 8.0*  MG 2.0 1.9 1.8 1.7 2.2  --   --   PHOS 6.3* 4.5  --   --   --   --  4.8*   GFR Estimated Creatinine Clearance: 13.3 mL/min (A) (by C-G formula based on SCr of 6.82 mg/dL (H)). Liver Function  Tests: Recent Labs  Lab 05/17/24 0544 05/21/24 0456  AST 95* 59*  ALT 41 31  ALKPHOS 128* 97  BILITOT 1.7* 1.3*  PROT 5.8* 5.2*  ALBUMIN  3.2* 2.4*   No results for input(s): LIPASE, AMYLASE in the last 168 hours. No results for input(s): AMMONIA in the last 168 hours.  Coagulation profile Recent Labs  Lab 05/16/24 1658 05/18/24 1056 05/19/24 0435 05/20/24 0313 05/21/24 0456  INR 1.4* 1.5* 1.6* 2.0* 2.5*    CBC: Recent Labs  Lab 05/19/24 0435 05/20/24 0313 05/21/24 0456 05/22/24 1129 05/23/24 0645  WBC 10.6* 10.4 15.5* 15.6* 14.2*  HGB 8.9* 10.1* 10.6* 11.1* 10.5*  HCT 27.6* 31.0* 32.7* 34.3* 32.6*  MCV 99.3 100.6* 100.9* 102.4* 101.9*  PLT 104* 114* 146* 187 121*   Cardiac Enzymes: No results for input(s): CKTOTAL, CKMB, CKMBINDEX, TROPONINI in the last 168 hours. BNP (last 3 results) No results for input(s): PROBNP in the last 8760 hours. CBG: Recent Labs  Lab 05/16/24 1605  GLUCAP 120*   D-Dimer: No results for input(s): DDIMER in the last 72 hours. Hgb A1c: No results for input(s): HGBA1C in the last 72 hours. Lipid Profile: No results for input(s): CHOL, HDL, LDLCALC, TRIG, CHOLHDL, LDLDIRECT in the last 72 hours. Thyroid function studies: No results for input(s): TSH, T4TOTAL, T3FREE, THYROIDAB in the last 72 hours.  Invalid input(s): FREET3 Anemia work up: No results for input(s): VITAMINB12, FOLATE, FERRITIN, TIBC, IRON , RETICCTPCT in the last 72 hours. Sepsis Labs: Recent Labs  Lab 05/20/24 0313 05/21/24 0456 05/22/24 1129 05/23/24 0645  WBC 10.4 15.5* 15.6* 14.2*    Microbiology Recent Results (from the past 240 hours)  MRSA Next Gen by PCR, Nasal     Status: None   Collection Time: 05/15/24  8:57 PM   Specimen: Nasal Mucosa; Nasal Swab  Result Value Ref Range Status  MRSA by PCR Next Gen NOT DETECTED NOT DETECTED Final    Comment: (NOTE) The GeneXpert MRSA Assay (FDA  approved for NASAL specimens only), is one component of a comprehensive MRSA colonization surveillance program. It is not intended to diagnose MRSA infection nor to guide or monitor treatment for MRSA infections. Test performance is not FDA approved in patients less than 39 years old. Performed at [facility], [address] [place], [place] [address]   SARS Coronavirus 2 by RT PCR (hospital order, performed in [facility] hospital lab) *cepheid single result test* Anterior Nasal Swab     Status: None   Collection Time: 05/15/24  8:58 PM   Specimen: Anterior Nasal Swab  Result Value Ref Range Status   SARS Coronavirus 2 by RT PCR NEGATIVE NEGATIVE Final    Comment: (NOTE) SARS-CoV-2 target nucleic acids are NOT DETECTED.  The SARS-CoV-2 RNA is generally detectable in upper and lower respiratory specimens during the acute phase of infection. The lowest concentration of SARS-CoV-2 viral copies this assay can detect is 250 copies / mL. A negative result does not preclude SARS-CoV-2 infection and should not be used as the sole basis for treatment or other patient management decisions.  A negative result may occur with improper specimen collection / handling, submission of specimen other than nasopharyngeal swab, presence of viral mutation(s) within the areas targeted by this assay, and inadequate number of viral copies (<250 copies / mL). A negative result must be combined with clinical observations, patient history, and epidemiological information.  Fact Sheet for Patients:   RoadLapTop.co.za  Fact Sheet for Healthcare Providers: http://kim-miller.com/  This test is not yet approved or  cleared by the [place]  FDA and has been authorized for detection and/or diagnosis of SARS-CoV-2 by FDA under an Emergency Use Authorization (EUA).  This EUA will remain in effect (meaning this test can be used) for the duration of  the COVID-19 declaration under Section 564(b)(1) of the Act, 21 U.S.C. section 360bbb-3(b)(1), unless the authorization is terminated or revoked sooner.  Performed at [facility], [address] [place], [place] [address]   Body fluid culture w Gram Stain     Status: None   Collection Time: 05/16/24  2:50 PM   Specimen: PATH Cytology Peritoneal fluid  Result Value Ref Range Status   Specimen Description   Final    PERITONEAL Performed at [facility], [address] [place], [place] [address]    Special Requests   Final    NONE Performed at [facility], [address] [place], [place] [address]    Gram Stain   Final    WBC PRESENT, PREDOMINANTLY MONONUCLEAR NO ORGANISMS SEEN CYTOSPIN SMEAR    Culture   Final    NO GROWTH 3 DAYS Performed at [facility], [address] [place], [place] [address]    Report Status 05/20/2024 FINAL  Final  Culture, blood (Routine X 2) w Reflex to ID Panel     Status: None   Collection Time: 05/16/24  6:15 PM   Specimen: BLOOD  Result Value Ref Range Status   Specimen Description BLOOD BLOOD RIGHT ARM  Final   Special Requests   Final    BOTTLES DRAWN AEROBIC AND ANAEROBIC Blood Culture adequate volume   Culture   Final    NO GROWTH 5 DAYS Performed at [facility], [address] [place], [place] [address]    Report Status 05/21/2024 FINAL  Final  Culture, blood (Routine X 2) w Reflex to ID Panel     Status: None   Collection Time: 05/16/24  6:28 PM   Specimen: BLOOD  Result Value Ref Range Status   Specimen Description BLOOD BLOOD RIGHT ARM  Final   Special Requests   Final    BOTTLES DRAWN AEROBIC AND ANAEROBIC Blood Culture adequate volume   Culture   Final    NO GROWTH 5 DAYS Performed at [facility], [address] [place], [place] [address]    Report Status 05/21/2024 FINAL  Final  Gastrointestinal Panel by PCR , Stool     Status: None   Collection Time:  05/17/24  3:20 AM   Specimen: Stool  Result Value Ref Range Status   Campylobacter species NOT DETECTED NOT DETECTED Final   Plesimonas shigelloides NOT DETECTED NOT DETECTED Final   Salmonella species NOT DETECTED NOT DETECTED Final   Yersinia enterocolitica NOT DETECTED NOT DETECTED Final   Vibrio species NOT DETECTED NOT DETECTED Final   Vibrio cholerae NOT DETECTED NOT DETECTED Final   Enteroaggregative E coli (EAEC) NOT DETECTED NOT DETECTED Final   Enteropathogenic E coli (EPEC) NOT DETECTED NOT DETECTED Final   Enterotoxigenic E coli (ETEC) NOT DETECTED NOT DETECTED Final   Shiga like toxin producing E coli (STEC) NOT DETECTED NOT DETECTED Final   Shigella/Enteroinvasive E coli (EIEC) NOT DETECTED NOT DETECTED Final   Cryptosporidium NOT DETECTED NOT DETECTED Final   Cyclospora cayetanensis NOT DETECTED NOT DETECTED Final   Entamoeba histolytica NOT DETECTED NOT DETECTED Final   Giardia lamblia NOT DETECTED NOT DETECTED Final   Adenovirus F40/41 NOT DETECTED NOT DETECTED Final   Astrovirus NOT DETECTED NOT DETECTED Final   Norovirus GI/GII NOT DETECTED NOT DETECTED Final   Rotavirus A NOT DETECTED NOT DETECTED Final   Sapovirus (I, II, IV, and V) NOT DETECTED NOT DETECTED Final    Comment: Performed at [facility], [address] [place], [place] [address]  C Difficile Quick Screen w PCR reflex     Status: Abnormal   Collection Time: 05/17/24  4:34 PM   Specimen: STOOL  Result Value Ref Range Status   C Diff antigen POSITIVE (A) NEGATIVE Final   C Diff toxin NEGATIVE NEGATIVE Final   C Diff interpretation Results are indeterminate. See PCR results.  Final    Comment: Performed at [facility], [address] [place], [place] [address]  C. Diff by PCR, Reflexed     Status: None   Collection Time: 05/17/24  4:34 PM  Result Value Ref Range Status   Toxigenic C. Difficile by PCR NEGATIVE NEGATIVE Final    Comment: Patient is colonized with non toxigenic  C. difficile. May not need treatment unless significant symptoms are present.   Hypervirulent Strain PRESUMPTIVE NEGATIVE PRESUMPTIVE NEGATIVE Final    Comment: Performed at [facility], [address] [place], [place] [address]    Procedures and diagnostic studies:  PERIPHERAL VASCULAR CATHETERIZATION Result Date: 05/21/2024 See surgical note for result.              LOS: 8 days   Trebor Galdamez  [facility]   Pager on [email or web address]. If 7PM-7AM, please contact night-coverage at [email or web address]     05/23/2024, 3:52 PM

## 2024-05-23 NOTE — Plan of Care (Signed)
  Problem: Education: Goal: Knowledge of General Education information will improve Description: Including pain rating scale, medication(s)/side effects and non-pharmacologic comfort measures Outcome: Progressing   Problem: Clinical Measurements: Goal: Ability to maintain clinical measurements within normal limits will improve Outcome: Progressing Goal: Will remain free from infection Outcome: Progressing   Problem: Clinical Measurements: Goal: Will remain free from infection Outcome: Progressing

## 2024-05-23 NOTE — Progress Notes (Signed)
 [facility]  ROUNDING NOTE   Subjective:   Patient seen and evaluated during dialysis   HEMODIALYSIS FLOWSHEET:  Blood Flow Rate (mL/min): 199 mL/min Arterial Pressure (mmHg): -71.11 mmHg Venous Pressure (mmHg): 24.24 mmHg TMP (mmHg): 3.84 mmHg Ultrafiltration Rate (mL/min): 686 mL/min Dialysate Flow Rate (mL/min): 300 ml/min Dialysis Fluid Bolus: Normal Saline Bolus Amount (mL): 300 mL  States he is weeping in BUE Remains on room air  09/24 0701 - 09/25 0700 In: 240 [P.O.:240] Out: 50 [Urine:50] Lab Results  Component Value Date   CREATININE 6.82 (H) 05/23/2024   CREATININE 5.37 (H) 05/22/2024   CREATININE 6.09 (H) 05/21/2024      Objective:  Vital signs in last 24 hours:  Temp:  [97.8 F (36.6 C)-98.3 F (36.8 C)] 97.8 F (36.6 C) (09/25 0837) Pulse Rate:  [45-50] 48 (09/25 0841) Resp:  [11-19] 12 (09/25 0841) BP: (94-105)/(47-51) 94/50 (09/25 0841) SpO2:  [94 %-98 %] 94 % (09/25 0837) Weight:  [114.5 kg-115.2 kg] 115.2 kg (09/25 0837)  Weight change: -0.5 kg Filed Weights   05/21/24 1314 05/23/24 0605 05/23/24 0837  Weight: 114.6 kg 114.5 kg 115.2 kg    Intake/Output: I/O last 3 completed shifts: In: 720 [P.O.:720] Out: 50 [Urine:50]   Intake/Output this shift:  No intake/output data recorded.  Physical Exam: General: NAD  Head: Normocephalic, atraumatic. Moist oral mucosal membranes  Eyes: Anicteric  Lungs:  Clear to auscultation, normal effort  Heart: Regular rate and rhythm  Abdomen:  Soft, nontender  Extremities: Trace-1+ peripheral edema.  Neurologic: Awake, alert, conversant  Skin: Warm,dry, no rash  Access: Right IJ PermCath    Basic Metabolic Panel: Recent Labs  Lab 05/17/24 0544 05/18/24 0447 05/19/24 0435 05/20/24 0313 05/21/24 0456 05/22/24 1129 05/23/24 0645  NA 142 140 136 139 135 134* 136  K 4.1 3.8 3.6 3.8 3.4* 3.5 4.4  CL 106 101 100 102 101 100 100  CO2 20* 22 26 26 22  21* 24  GLUCOSE 121* 112*  110* 114* 120* 130* 117*  BUN 91* 64* 47* 58* 74* 63* 67*  CREATININE 7.87* 5.73* 4.65* 5.32* 6.09* 5.37* 6.82*  CALCIUM  7.8* 8.0* 7.7* 7.8* 7.9* 8.0* 8.0*  MG 2.0 1.9 1.8 1.7 2.2  --   --   PHOS 6.3* 4.5  --   --   --   --   --     Liver Function Tests: Recent Labs  Lab 05/17/24 0544 05/21/24 0456  AST 95* 59*  ALT 41 31  ALKPHOS 128* 97  BILITOT 1.7* 1.3*  PROT 5.8* 5.2*  ALBUMIN  3.2* 2.4*   No results for input(s): LIPASE, AMYLASE in the last 168 hours. Recent Labs  Lab 05/16/24 1128  AMMONIA 67*    CBC: Recent Labs  Lab 05/19/24 0435 05/20/24 0313 05/21/24 0456 05/22/24 1129 05/23/24 0645  WBC 10.6* 10.4 15.5* 15.6* 14.2*  HGB 8.9* 10.1* 10.6* 11.1* 10.5*  HCT 27.6* 31.0* 32.7* 34.3* 32.6*  MCV 99.3 100.6* 100.9* 102.4* 101.9*  PLT 104* 114* 146* 187 121*    Cardiac Enzymes: No results for input(s): CKTOTAL, CKMB, CKMBINDEX, TROPONINI in the last 168 hours.  BNP: Invalid input(s): POCBNP  CBG: Recent Labs  Lab 05/16/24 1605  GLUCAP 120*    Microbiology: Results for orders placed or performed during the hospital encounter of 05/15/24  MRSA Next Gen by PCR, Nasal     Status: None   Collection Time: 05/15/24  8:57 PM   Specimen: Nasal Mucosa; Nasal Swab  Result  Value Ref Range Status   MRSA by PCR Next Gen NOT DETECTED NOT DETECTED Final    Comment: (NOTE) The GeneXpert MRSA Assay (FDA approved for NASAL specimens only), is one component of a comprehensive MRSA colonization surveillance program. It is not intended to diagnose MRSA infection nor to guide or monitor treatment for MRSA infections. Test performance is not FDA approved in patients less than 45 years old. Performed at [facility], [address] [place], [place] [address]   SARS Coronavirus 2 by RT PCR (hospital order, performed in [facility] hospital lab) *cepheid single result test* Anterior Nasal Swab     Status: None   Collection Time: 05/15/24  8:58 PM    Specimen: Anterior Nasal Swab  Result Value Ref Range Status   SARS Coronavirus 2 by RT PCR NEGATIVE NEGATIVE Final    Comment: (NOTE) SARS-CoV-2 target nucleic acids are NOT DETECTED.  The SARS-CoV-2 RNA is generally detectable in upper and lower respiratory specimens during the acute phase of infection. The lowest concentration of SARS-CoV-2 viral copies this assay can detect is 250 copies / mL. A negative result does not preclude SARS-CoV-2 infection and should not be used as the sole basis for treatment or other patient management decisions.  A negative result may occur with improper specimen collection / handling, submission of specimen other than nasopharyngeal swab, presence of viral mutation(s) within the areas targeted by this assay, and inadequate number of viral copies (<250 copies / mL). A negative result must be combined with clinical observations, patient history, and epidemiological information.  Fact Sheet for Patients:   RoadLapTop.co.za  Fact Sheet for Healthcare Providers: http://kim-miller.com/  This test is not yet approved or  cleared by the [place]  FDA and has been authorized for detection and/or diagnosis of SARS-CoV-2 by FDA under an Emergency Use Authorization (EUA).  This EUA will remain in effect (meaning this test can be used) for the duration of the COVID-19 declaration under Section 564(b)(1) of the Act, 21 U.S.C. section 360bbb-3(b)(1), unless the authorization is terminated or revoked sooner.  Performed at [facility], [address] [place], [place] [address]   Body fluid culture w Gram Stain     Status: None   Collection Time: 05/16/24  2:50 PM   Specimen: PATH Cytology Peritoneal fluid  Result Value Ref Range Status   Specimen Description   Final    PERITONEAL Performed at [facility], [address], [place], [place] [address]    Special Requests   Final     NONE Performed at [facility], [address] [place], [place] [address]    Gram Stain   Final    WBC PRESENT, PREDOMINANTLY MONONUCLEAR NO ORGANISMS SEEN CYTOSPIN SMEAR    Culture   Final    NO GROWTH 3 DAYS Performed at [facility], [address], [place], [place] [address]    Report Status 05/20/2024 FINAL  Final  Culture, blood (Routine X 2) w Reflex to ID Panel     Status: None   Collection Time: 05/16/24  6:15 PM   Specimen: BLOOD  Result Value Ref Range Status   Specimen Description BLOOD BLOOD RIGHT ARM  Final   Special Requests   Final    BOTTLES DRAWN AEROBIC AND ANAEROBIC Blood Culture adequate volume   Culture   Final    NO GROWTH 5 DAYS Performed at [facility], [address] [place], [place] [address]    Report  Status 05/21/2024 FINAL  Final  Culture, blood (Routine X 2) w Reflex to ID Panel     Status: None   Collection Time: 05/16/24  6:28 PM   Specimen: BLOOD  Result Value Ref Range Status   Specimen Description BLOOD BLOOD RIGHT ARM  Final   Special Requests   Final    BOTTLES DRAWN AEROBIC AND ANAEROBIC Blood Culture adequate volume   Culture   Final    NO GROWTH 5 DAYS Performed at [facility], [address] [place], [place] [address]    Report Status 05/21/2024 FINAL  Final  Gastrointestinal Panel by PCR , Stool     Status: None   Collection Time: 05/17/24  3:20 AM   Specimen: Stool  Result Value Ref Range Status   Campylobacter species NOT DETECTED NOT DETECTED Final   Plesimonas shigelloides NOT DETECTED NOT DETECTED Final   Salmonella species NOT DETECTED NOT DETECTED Final   Yersinia enterocolitica NOT DETECTED NOT DETECTED Final   Vibrio species NOT DETECTED NOT DETECTED Final   Vibrio cholerae NOT DETECTED NOT DETECTED Final   Enteroaggregative E coli (EAEC) NOT DETECTED NOT DETECTED Final   Enteropathogenic E coli (EPEC) NOT DETECTED NOT DETECTED Final   Enterotoxigenic E coli (ETEC) NOT  DETECTED NOT DETECTED Final   Shiga like toxin producing E coli (STEC) NOT DETECTED NOT DETECTED Final   Shigella/Enteroinvasive E coli (EIEC) NOT DETECTED NOT DETECTED Final   Cryptosporidium NOT DETECTED NOT DETECTED Final   Cyclospora cayetanensis NOT DETECTED NOT DETECTED Final   Entamoeba histolytica NOT DETECTED NOT DETECTED Final   Giardia lamblia NOT DETECTED NOT DETECTED Final   Adenovirus F40/41 NOT DETECTED NOT DETECTED Final   Astrovirus NOT DETECTED NOT DETECTED Final   Norovirus GI/GII NOT DETECTED NOT DETECTED Final   Rotavirus A NOT DETECTED NOT DETECTED Final   Sapovirus (I, II, IV, and V) NOT DETECTED NOT DETECTED Final    Comment: Performed at [facility], [address] [place], [place] [address]  C Difficile Quick Screen w PCR reflex     Status: Abnormal   Collection Time: 05/17/24  4:34 PM   Specimen: STOOL  Result Value Ref Range Status   C Diff antigen POSITIVE (A) NEGATIVE Final   C Diff toxin NEGATIVE NEGATIVE Final   C Diff interpretation Results are indeterminate. See PCR results.  Final    Comment: Performed at [facility], [address] [place], [place] [address]  C. Diff by PCR, Reflexed     Status: None   Collection Time: 05/17/24  4:34 PM  Result Value Ref Range Status   Toxigenic C. Difficile by PCR NEGATIVE NEGATIVE Final    Comment: Patient is colonized with non toxigenic C. difficile. May not need treatment unless significant symptoms are present.   Hypervirulent Strain PRESUMPTIVE NEGATIVE PRESUMPTIVE NEGATIVE Final    Comment: Performed at [facility], [address] [place], [place] [address]    Coagulation Studies: Recent Labs    05/21/24 0456  LABPROT 28.1*  INR 2.5*    Urinalysis: No results for input(s): COLORURINE, LABSPEC, PHURINE, GLUCOSEU, HGBUR, BILIRUBINUR, KETONESUR, PROTEINUR, UROBILINOGEN, NITRITE, LEUKOCYTESUR in the last 72 hours.  Invalid input(s):  APPERANCEUR     Imaging: PERIPHERAL VASCULAR CATHETERIZATION Result Date: 05/21/2024 See surgical note for result.     Medications:      apixaban   10 mg Oral BID   Followed by   NOREEN ON 05/28/2024] apixaban   5 mg Oral BID  Chlorhexidine  Gluconate Cloth  6 each Topical Q0600   ezetimibe   10 mg Oral Daily   feeding supplement (NEPRO CARB STEADY)  237 mL Oral BID BM   iron  polysaccharides  150 mg Oral BID   melatonin  5 mg Oral QHS   midodrine   10 mg Oral TID WC   multivitamin  1 tablet Oral QHS   pantoprazole   40 mg Oral Daily   simvastatin   20 mg Oral QHS   thiamine   100 mg Oral Daily   alteplase , docusate sodium , heparin , ipratropium-albuterol , mouth rinse, polyethylene glycol  Assessment/ Plan:  [name] is a 68 y.o.  male  with past medical conditions including hypertension, COPD, obstructive sleep apnea with CPAP, anemia, and chronic kidney disease stage IV, who was admitted to [facility] on 05/15/2024 for Shock circulatory (HCC) [R57.9] Generalized weakness [R53.1] Hypotension, unspecified hypotension type [I95.9] Acute renal failure, unspecified acute renal failure type [N17.9]   Acute kidney injury on chronic kidney disease stage IV. Baseline creatinine 2.77 with GFR 24 06/30/2024. Acute kidney injury likely secondary to hypotension and infectious process. Renal ultrasound negative for obstruction. No recent IV contrast exposure.    Appreciate vascular for placing right IJ PermCath on 9/23.  Receiving dialysis today, will increase UF to 2L due to weeping. Will receive Albumin  with dialysis to optimize fluid removal and support blood pressure. Next treatment scheduled for Saturday.   Lab Results  Component Value Date   CREATININE 6.82 (H) 05/23/2024   CREATININE 5.37 (H) 05/22/2024   CREATININE 6.09 (H) 05/21/2024    Intake/Output Summary (Last 24 hours) at 05/23/2024 1028 Last data filed at 05/22/2024 2300 Gross per 24 hour  Intake 240 ml  Output 50 ml   Net 190 ml   2.  Acute metabolic acidosis, serum bicarbonate corrected with dialysis  3. Anemia of chronic kidney disease Lab Results  Component Value Date   HGB 10.5 (L) 05/23/2024  Hemoglobin 10.5.  No need for ESA at this time  4.  Hypotension likely sepsis related, source unknown at this time.  Patient off pressors. Blood pressure 94/50 during dialysis.   5. Secondary Hyperparathyroidism: with outpatient labs: PTH 52.7, phosphorus 4.1, calcium  8.6 on 7/2.  Lab Results  Component Value Date   CALCIUM  8.0 (L) 05/23/2024   CAION 1.21 08/08/2023   PHOS 4.5 05/18/2024   Calcium  stable. Awaiting updated Phos   LOS: 8 Tyia Binford 9/25/202510:28 AM

## 2024-05-23 NOTE — Progress Notes (Signed)
 Pt accepted at [facility] TTS@11am .  Pt can start  05/28/2024 and will need to arrive at 10:45am.                      . Suzen Satchel Dialysis Navigator [email or web address]

## 2024-05-23 NOTE — Progress Notes (Signed)
 Occupational Therapy Treatment Patient Details Name: Kevin Gross MRN: [id number] DOB: 03-Dec-1955 Today's Date: 05/23/2024   History of present illness Pt is a 68 yo male presenting to [facility] ED from home for evaluation of SOB and progressive weakness. Initially admitted to ICU on pressors for circulatory shock, acute renal failure, CAP and portal vein thrombosis. PMH: alcoholic cirrhosis of liver, CHF, COPD, HLD, HTN, stage III CKD, gout, anemia, PVD, OSA on CPAP.   OT comments  Pt is supine in bed on arrival. Pleasant and agreeable to OT session. He denies pain. Pt performed bed mobility with supervision, increased time and effort and use of bed features. Reports this may be a challenge returning home, therefore educated on purchase of bed rail or sleeping in recliner as needed. He demo transfers from EOB and comfort height toilet with SBA, cues for hand placement and seated peri-care after BM with supervision. He declined further ADLs this date and wished to sit up in recliner to eat after dialysis, therefore ambulated ~10-15 ft with SBA using RW. Pt with small bout of saliva emesis with nurse notified and cold washcloth placed on his forehead. He was left seated in recliner with all needs in place and will cont to require skilled acute OT services to maximize his safety and IND to return to PLOF.       If plan is discharge home, recommend the following:  A little help with walking and/or transfers;A little help with bathing/dressing/bathroom;Assistance with cooking/housework;Assist for transportation;Help with stairs or ramp for entrance   Equipment Recommendations  BSC/3in1    Recommendations for Other Services      Precautions / Restrictions Precautions Precautions: Fall Recall of Precautions/Restrictions: Intact Restrictions Weight Bearing Restrictions Per Provider Order: No       Mobility Bed Mobility Overal bed mobility: Needs Assistance Bed Mobility: Supine to Sit      Supine to sit: Used rails, HOB elevated, Supervision     General bed mobility comments: increased time and effort required to bring trunk upright, but no physical assist required    Transfers Overall transfer level: Needs assistance Equipment used: Rolling walker (2 wheels) Transfers: Sit to/from Stand Sit to Stand: Supervision, Contact guard assist           General transfer comment: able to stand from EOB with SBA with cues for hand placement and ambulate to the bathroom and back around bed to recliner utilizing RW with SBA     Balance Overall balance assessment: Needs assistance Sitting-balance support: Feet supported Sitting balance-Leahy Scale: Normal     Standing balance support: Bilateral upper extremity supported, During functional activity, Reliant on assistive device for balance Standing balance-Leahy Scale: Good Standing balance comment: no LOB utilizing RW to ambulate                           ADL either performed or assessed with clinical judgement   ADL Overall ADL's : Needs assistance/impaired     Grooming: Wash/dry hands;Standing;Contact guard assist                   Toilet Transfer: Contact guard assist;Rolling walker (2 wheels);Comfort height toilet;Ambulation   Toileting- Clothing Manipulation and Hygiene: Supervision/safety;Sitting/lateral lean Toileting - Clothing Manipulation Details (indicate cue type and reason): posterior hygiene/peri-care     Functional mobility during ADLs: Contact guard assist;Rolling walker (2 wheels)      Extremity/Trunk Assessment  Vision       Perception     Praxis     Communication Communication Communication: No apparent difficulties   Cognition Arousal: Alert Behavior During Therapy: WFL for tasks assessed/performed                                 Following commands: Intact        Cueing   Cueing Techniques: Verbal cues  Exercises       Shoulder Instructions       General Comments pt with small bout of emesis/saliva at end of session once he sat in recliner-nurse notified    Pertinent Vitals/ Pain       Pain Assessment Pain Assessment: No/denies pain  Home Living                                          Prior Functioning/Environment              Frequency  Min 2X/week        Progress Toward Goals  OT Goals(current goals can now be found in the care plan section)  Progress towards OT goals: Progressing toward goals  Acute Rehab OT Goals Patient Stated Goal: return home OT Goal Formulation: With patient Time For Goal Achievement: 06/03/24 Potential to Achieve Goals: Good  Plan      Co-evaluation                 AM-PAC OT 6 Clicks Daily Activity     Outcome Measure   Help from another person eating meals?: None Help from another person taking care of personal grooming?: None Help from another person toileting, which includes using toliet, bedpan, or urinal?: A Little Help from another person bathing (including washing, rinsing, drying)?: A Little Help from another person to put on and taking off regular upper body clothing?: None Help from another person to put on and taking off regular lower body clothing?: A Little 6 Click Score: 21    End of Session Equipment Utilized During Treatment: Gait belt;Rolling walker (2 wheels)  OT Visit Diagnosis: Other abnormalities of gait and mobility (R26.89);Muscle weakness (generalized) (M62.81)   Activity Tolerance Patient tolerated treatment well   Patient Left in chair;with call bell/phone within reach;with chair alarm set   Nurse Communication Mobility status        Time: 8462-8442 OT Time Calculation (min): 20 min  Charges: OT General Charges $OT Visit: 1 Visit OT Treatments $Self Care/Home Management : 8-22 mins  Neill Jurewicz, OTR/L  05/23/24, 4:24 PM  Devine Klingel E Yarah Fuente 05/23/2024, 4:21 PM

## 2024-05-23 NOTE — Plan of Care (Signed)
  Problem: Education: Goal: Knowledge of General Education information will improve Description: Including pain rating scale, medication(s)/side effects and non-pharmacologic comfort measures Outcome: Progressing   Problem: Health Behavior/Discharge Planning: Goal: Ability to manage health-related needs will improve Outcome: Progressing   Problem: Clinical Measurements: Goal: Ability to maintain clinical measurements within normal limits will improve Outcome: Progressing Goal: Will remain free from infection Outcome: Progressing Goal: Diagnostic test results will improve Outcome: Progressing Goal: Respiratory complications will improve Outcome: Progressing Goal: Cardiovascular complication will be avoided Outcome: Progressing   Problem: Activity: Goal: Risk for activity intolerance will decrease Outcome: Progressing   Problem: Nutrition: Goal: Adequate nutrition will be maintained Outcome: Progressing   Problem: Coping: Goal: Level of anxiety will decrease Outcome: Progressing   Problem: Elimination: Goal: Will not experience complications related to bowel motility Outcome: Progressing Goal: Will not experience complications related to urinary retention Outcome: Progressing   Problem: Pain Managment: Goal: General experience of comfort will improve and/or be controlled Outcome: Progressing   Problem: Safety: Goal: Ability to remain free from injury will improve Outcome: Progressing   Problem: Skin Integrity: Goal: Risk for impaired skin integrity will decrease Outcome: Progressing   Problem: Education: Goal: Knowledge of disease and its progression will improve Outcome: Progressing   Problem: Health Behavior/Discharge Planning: Goal: Ability to manage health-related needs will improve Outcome: Progressing   Problem: Clinical Measurements: Goal: Complications related to the disease process or treatment will be avoided or minimized Outcome:  Progressing Goal: Dialysis access will remain free of complications Outcome: Progressing   Problem: Activity: Goal: Activity intolerance will improve Outcome: Progressing   Problem: Fluid Volume: Goal: Fluid volume balance will be maintained or improved Outcome: Progressing   Problem: Nutritional: Goal: Ability to make appropriate dietary choices will improve Outcome: Progressing   Problem: Respiratory: Goal: Respiratory symptoms related to disease process will be avoided Outcome: Progressing   Problem: Self-Concept: Goal: Body image disturbance will be avoided or minimized Outcome: Progressing   Problem: Urinary Elimination: Goal: Progression of disease will be identified and treated Outcome: Progressing

## 2024-05-24 ENCOUNTER — Other Ambulatory Visit: Payer: Self-pay

## 2024-05-24 ENCOUNTER — Encounter: Payer: Self-pay | Admitting: Oncology

## 2024-05-24 DIAGNOSIS — R579 Shock, unspecified: Secondary | ICD-10-CM | POA: Diagnosis not present

## 2024-05-24 LAB — CBC
HCT: 31.1 % — ABNORMAL LOW (ref 39.0–52.0)
Hemoglobin: 10 g/dL — ABNORMAL LOW (ref 13.0–17.0)
MCH: 32.6 pg (ref 26.0–34.0)
MCHC: 32.2 g/dL (ref 30.0–36.0)
MCV: 101.3 fL — ABNORMAL HIGH (ref 80.0–100.0)
Platelets: 116 K/uL — ABNORMAL LOW (ref 150–400)
RBC: 3.07 MIL/uL — ABNORMAL LOW (ref 4.22–5.81)
RDW: 22.8 % — ABNORMAL HIGH (ref 11.5–15.5)
WBC: 11.4 K/uL — ABNORMAL HIGH (ref 4.0–10.5)
nRBC: 0 % (ref 0.0–0.2)

## 2024-05-24 LAB — RENAL FUNCTION PANEL
Albumin: 2.7 g/dL — ABNORMAL LOW (ref 3.5–5.0)
Anion gap: 13 (ref 5–15)
BUN: 49 mg/dL — ABNORMAL HIGH (ref 8–23)
CO2: 24 mmol/L (ref 22–32)
Calcium: 8 mg/dL — ABNORMAL LOW (ref 8.9–10.3)
Chloride: 98 mmol/L (ref 98–111)
Creatinine, Ser: 5.39 mg/dL — ABNORMAL HIGH (ref 0.61–1.24)
GFR, Estimated: 11 mL/min — ABNORMAL LOW (ref >60)
Glucose, Bld: 121 mg/dL — ABNORMAL HIGH (ref 70–99)
Phosphorus: 5.3 mg/dL — ABNORMAL HIGH (ref 2.5–4.6)
Potassium: 3.5 mmol/L (ref 3.5–5.1)
Sodium: 135 mmol/L (ref 135–145)

## 2024-05-24 MED ORDER — HEPARIN SODIUM (PORCINE) 1000 UNIT/ML IJ SOLN
INTRAMUSCULAR | Status: AC
  Filled 2024-05-24: qty 5

## 2024-05-24 MED ORDER — MIDODRINE HCL 5 MG PO TABS
10.0000 mg | ORAL_TABLET | Freq: Once | ORAL | Status: AC
  Administered 2024-05-24: 10 mg via ORAL

## 2024-05-24 MED ORDER — APIXABAN (ELIQUIS) VTE STARTER PACK (10MG AND 5MG)
ORAL_TABLET | ORAL | 0 refills | Status: DC
  Filled 2024-05-24: qty 74, 28d supply, fill #0

## 2024-05-24 MED ORDER — MIDODRINE HCL 5 MG PO TABS
ORAL_TABLET | ORAL | Status: AC
  Filled 2024-05-24: qty 2

## 2024-05-24 MED ORDER — MIDODRINE HCL 10 MG PO TABS
10.0000 mg | ORAL_TABLET | Freq: Three times a day (TID) | ORAL | 0 refills | Status: DC
  Filled 2024-05-24: qty 90, 30d supply, fill #0

## 2024-05-24 NOTE — Progress Notes (Signed)
 New Dialysis Start (AKI)   Patient identified as new dialysis start. Kidney Education packet assembled and given. Discussed the following items with patient:     Current medications and possible changes once started:  Discussed that patient's medications may change over time.  Ex; hypertension medications and diabetes medication.  Nephrologists will adjust as needed.   Fluid restrictions reviewed:  32 oz daily goal:  All liquids count; soups, ice, jello, fruits. Will also refer dietitian.   Phosphorus and potassium: Handout given showing high potassium and phosphorus foods.  Alternative food and drink options given. Will also refer dietitian.   Family support:  None at bedside   Outpatient Clinic Resources:  Discussed roles of Outpatient clinic staff and advised to make a list of needs, if any, to talk with outpatient staff if needed   Care plan schedule: Informed patient of Care Plans in outpatient setting and to participate in the care plan.  An invitation would be given from outpatient clinic.    Dialysis Access Options:  Reviewed access options with patients. If dialysis catheter present, educated that patient could not take showers.  Catheter dressing changes were to be done by outpatient clinic staff only.     Patient verbalized understanding. Will continue to round on patient during admission.    Alfonso Sar Dialysis Nurse Coordinator [phone]

## 2024-05-24 NOTE — Progress Notes (Signed)
 [facility]  ROUNDING NOTE   Subjective:   Patient seen resting today Room air Tolerating meals  09/25 0701 - 09/26 0700 In: 0  Out: 1000  Lab Results  Component Value Date   CREATININE 6.82 (H) 05/23/2024   CREATININE 5.37 (H) 05/22/2024   CREATININE 6.09 (H) 05/21/2024      Objective:  Vital signs in last 24 hours:  Temp:  [97.5 F (36.4 C)-98.4 F (36.9 C)] 97.7 F (36.5 C) (09/26 1024) Pulse Rate:  [44-63] 50 (09/26 1024) Resp:  [13-23] 16 (09/26 1024) BP: (81-131)/(42-69) 96/51 (09/26 1024) SpO2:  [94 %-98 %] 96 % (09/26 1024) Weight:  [114.3 kg-114.4 kg] 114.3 kg (09/26 0521)  Weight change: 0.7 kg Filed Weights   05/23/24 0837 05/23/24 1236 05/24/24 0521  Weight: 115.2 kg 114.4 kg 114.3 kg    Intake/Output: I/O last 3 completed shifts: In: 0  Out: 1050 [Urine:50; Other:1000]   Intake/Output this shift:  Total I/O In: 480 [P.O.:480] Out: -   Physical Exam: General: NAD  Head: Normocephalic, atraumatic. Moist oral mucosal membranes  Eyes: Anicteric  Lungs:  Clear to auscultation, normal effort  Heart: Regular rate and rhythm  Abdomen:  Soft, nontender  Extremities: Trace peripheral edema.  Neurologic: Awake, alert, conversant  Skin: Warm,dry, no rash  Access: Right IJ PermCath    Basic Metabolic Panel: Recent Labs  Lab 05/18/24 0447 05/19/24 0435 05/20/24 0313 05/21/24 0456 05/22/24 1129 05/23/24 0645  NA 140 136 139 135 134* 136  K 3.8 3.6 3.8 3.4* 3.5 4.4  CL 101 100 102 101 100 100  CO2 22 26 26 22  21* 24  GLUCOSE 112* 110* 114* 120* 130* 117*  BUN 64* 47* 58* 74* 63* 67*  CREATININE 5.73* 4.65* 5.32* 6.09* 5.37* 6.82*  CALCIUM  8.0* 7.7* 7.8* 7.9* 8.0* 8.0*  MG 1.9 1.8 1.7 2.2  --   --   PHOS 4.5  --   --   --   --  4.8*    Liver Function Tests: Recent Labs  Lab 05/21/24 0456  AST 59*  ALT 31  ALKPHOS 97  BILITOT 1.3*  PROT 5.2*  ALBUMIN  2.4*   No results for input(s): LIPASE, AMYLASE in the last  168 hours. No results for input(s): AMMONIA in the last 168 hours.   CBC: Recent Labs  Lab 05/20/24 0313 05/21/24 0456 05/22/24 1129 05/23/24 0645 05/24/24 0435  WBC 10.4 15.5* 15.6* 14.2* 11.4*  HGB 10.1* 10.6* 11.1* 10.5* 10.0*  HCT 31.0* 32.7* 34.3* 32.6* 31.1*  MCV 100.6* 100.9* 102.4* 101.9* 101.3*  PLT 114* 146* 187 121* 116*    Cardiac Enzymes: No results for input(s): CKTOTAL, CKMB, CKMBINDEX, TROPONINI in the last 168 hours.  BNP: Invalid input(s): POCBNP  CBG: No results for input(s): GLUCAP in the last 168 hours.   Microbiology: Results for orders placed or performed during the hospital encounter of 05/15/24  MRSA Next Gen by PCR, Nasal     Status: None   Collection Time: 05/15/24  8:57 PM   Specimen: Nasal Mucosa; Nasal Swab  Result Value Ref Range Status   MRSA by PCR Next Gen NOT DETECTED NOT DETECTED Final    Comment: (NOTE) The GeneXpert MRSA Assay (FDA approved for NASAL specimens only), is one component of a comprehensive MRSA colonization surveillance program. It is not intended to diagnose MRSA infection nor to guide or monitor treatment for MRSA infections. Test performance is not FDA approved in patients less than 52 years old. Performed  at [facility], [address] [place], [place] [address]   SARS Coronavirus 2 by RT PCR (hospital order, performed in [facility] hospital lab) *cepheid single result test* Anterior Nasal Swab     Status: None   Collection Time: 05/15/24  8:58 PM   Specimen: Anterior Nasal Swab  Result Value Ref Range Status   SARS Coronavirus 2 by RT PCR NEGATIVE NEGATIVE Final    Comment: (NOTE) SARS-CoV-2 target nucleic acids are NOT DETECTED.  The SARS-CoV-2 RNA is generally detectable in upper and lower respiratory specimens during the acute phase of infection. The lowest concentration of SARS-CoV-2 viral copies this assay can detect is 250 copies / mL. A negative result does not preclude  SARS-CoV-2 infection and should not be used as the sole basis for treatment or other patient management decisions.  A negative result may occur with improper specimen collection / handling, submission of specimen other than nasopharyngeal swab, presence of viral mutation(s) within the areas targeted by this assay, and inadequate number of viral copies (<250 copies / mL). A negative result must be combined with clinical observations, patient history, and epidemiological information.  Fact Sheet for Patients:   RoadLapTop.co.za  Fact Sheet for Healthcare Providers: http://kim-miller.com/  This test is not yet approved or  cleared by the [place]  FDA and has been authorized for detection and/or diagnosis of SARS-CoV-2 by FDA under an Emergency Use Authorization (EUA).  This EUA will remain in effect (meaning this test can be used) for the duration of the COVID-19 declaration under Section 564(b)(1) of the Act, 21 U.S.C. section 360bbb-3(b)(1), unless the authorization is terminated or revoked sooner.  Performed at [facility], [address] [place], [place] [address]   Body fluid culture w Gram Stain     Status: None   Collection Time: 05/16/24  2:50 PM   Specimen: PATH Cytology Peritoneal fluid  Result Value Ref Range Status   Specimen Description   Final    PERITONEAL Performed at [facility], [address], [place], [place] [address]    Special Requests   Final    NONE Performed at [facility], [address] [place], [place] [address]    Gram Stain   Final    WBC PRESENT, PREDOMINANTLY MONONUCLEAR NO ORGANISMS SEEN CYTOSPIN SMEAR    Culture   Final    NO GROWTH 3 DAYS Performed at [facility], [address], [place], [place] [address]    Report Status 05/20/2024 FINAL  Final  Culture, blood (Routine X 2) w Reflex to ID Panel     Status: None   Collection Time: 05/16/24  6:15  PM   Specimen: BLOOD  Result Value Ref Range Status   Specimen Description BLOOD BLOOD RIGHT ARM  Final   Special Requests   Final    BOTTLES DRAWN AEROBIC AND ANAEROBIC Blood Culture adequate volume   Culture   Final    NO GROWTH 5 DAYS Performed at [facility], [address] [place], [place] [address]    Report Status 05/21/2024 FINAL  Final  Culture, blood (Routine X 2) w Reflex to ID Panel     Status: None   Collection Time: 05/16/24  6:28 PM   Specimen: BLOOD  Result Value Ref Range Status   Specimen Description BLOOD BLOOD RIGHT ARM  Final   Special Requests   Final    BOTTLES DRAWN AEROBIC AND ANAEROBIC Blood Culture adequate volume   Culture  Final    NO GROWTH 5 DAYS Performed at [facility], [address] [place], [place] [address]    Report Status 05/21/2024 FINAL  Final  Gastrointestinal Panel by PCR , Stool     Status: None   Collection Time: 05/17/24  3:20 AM   Specimen: Stool  Result Value Ref Range Status   Campylobacter species NOT DETECTED NOT DETECTED Final   Plesimonas shigelloides NOT DETECTED NOT DETECTED Final   Salmonella species NOT DETECTED NOT DETECTED Final   Yersinia enterocolitica NOT DETECTED NOT DETECTED Final   Vibrio species NOT DETECTED NOT DETECTED Final   Vibrio cholerae NOT DETECTED NOT DETECTED Final   Enteroaggregative E coli (EAEC) NOT DETECTED NOT DETECTED Final   Enteropathogenic E coli (EPEC) NOT DETECTED NOT DETECTED Final   Enterotoxigenic E coli (ETEC) NOT DETECTED NOT DETECTED Final   Shiga like toxin producing E coli (STEC) NOT DETECTED NOT DETECTED Final   Shigella/Enteroinvasive E coli (EIEC) NOT DETECTED NOT DETECTED Final   Cryptosporidium NOT DETECTED NOT DETECTED Final   Cyclospora cayetanensis NOT DETECTED NOT DETECTED Final   Entamoeba histolytica NOT DETECTED NOT DETECTED Final   Giardia lamblia NOT DETECTED NOT DETECTED Final   Adenovirus F40/41 NOT DETECTED NOT DETECTED Final   Astrovirus  NOT DETECTED NOT DETECTED Final   Norovirus GI/GII NOT DETECTED NOT DETECTED Final   Rotavirus A NOT DETECTED NOT DETECTED Final   Sapovirus (I, II, IV, and V) NOT DETECTED NOT DETECTED Final    Comment: Performed at [facility], [address] [place], [place] [address]  C Difficile Quick Screen w PCR reflex     Status: Abnormal   Collection Time: 05/17/24  4:34 PM   Specimen: STOOL  Result Value Ref Range Status   C Diff antigen POSITIVE (A) NEGATIVE Final   C Diff toxin NEGATIVE NEGATIVE Final   C Diff interpretation Results are indeterminate. See PCR results.  Final    Comment: Performed at [facility], [address] [place], [place] [address]  C. Diff by PCR, Reflexed     Status: None   Collection Time: 05/17/24  4:34 PM  Result Value Ref Range Status   Toxigenic C. Difficile by PCR NEGATIVE NEGATIVE Final    Comment: Patient is colonized with non toxigenic C. difficile. May not need treatment unless significant symptoms are present.   Hypervirulent Strain PRESUMPTIVE NEGATIVE PRESUMPTIVE NEGATIVE Final    Comment: Performed at [facility], [address] [place], [place] [address]    Coagulation Studies: No results for input(s): LABPROT, INR in the last 72 hours.   Urinalysis: No results for input(s): COLORURINE, LABSPEC, PHURINE, GLUCOSEU, HGBUR, BILIRUBINUR, KETONESUR, PROTEINUR, UROBILINOGEN, NITRITE, LEUKOCYTESUR in the last 72 hours.  Invalid input(s): APPERANCEUR     Imaging: No results found.     Medications:      apixaban   10 mg Oral BID   Followed by   NOREEN ON 05/28/2024] apixaban   5 mg Oral BID   Chlorhexidine  Gluconate Cloth  6 each Topical Q0600   ezetimibe   10 mg Oral Daily   feeding supplement (NEPRO CARB STEADY)  237 mL Oral BID BM   iron  polysaccharides  150 mg Oral BID   melatonin  5 mg Oral QHS   midodrine   10 mg Oral TID WC   multivitamin  1 tablet Oral QHS   pantoprazole    40 mg Oral Daily   simvastatin   20 mg Oral QHS   thiamine   100 mg Oral Daily   docusate sodium , ipratropium-albuterol , ondansetron  (ZOFRAN ) IV, mouth rinse, polyethylene glycol  Assessment/ Plan:  Kevin Gross is a 68 y.o.  male  with past medical conditions including hypertension, COPD, obstructive sleep apnea with CPAP, anemia, and chronic kidney disease stage IV, who was admitted to [facility] on 05/15/2024 for Shock circulatory (HCC) [R57.9] Generalized weakness [R53.1] Hypotension, unspecified hypotension type [I95.9] Acute renal failure, unspecified acute renal failure type [N17.9]   Acute kidney injury on chronic kidney disease stage IV. Baseline creatinine 2.77 with GFR 24 06/30/2024. Acute kidney injury likely secondary to hypotension and infectious process. Renal ultrasound negative for obstruction. No recent IV contrast exposure.    Appreciate vascular for placing right IJ PermCath on 9/23. Dialysis received yesterday, UF 1L achieved. Will provide a short treatment today. Confirmed outpatient clinic at [facility] on TTS schedule, can start on Tuesday.   Lab Results  Component Value Date   CREATININE 6.82 (H) 05/23/2024   CREATININE 5.37 (H) 05/22/2024   CREATININE 6.09 (H) 05/21/2024    Intake/Output Summary (Last 24 hours) at 05/24/2024 1203 Last data filed at 05/24/2024 0900 Gross per 24 hour  Intake 480 ml  Output 1000 ml  Net -520 ml   2.  Acute metabolic acidosis, serum bicarbonate corrected with dialysis  3. Anemia of chronic kidney disease Lab Results  Component Value Date   HGB 10.0 (L) 05/24/2024  Hemoglobin 10.0.  No need for ESA at this time  4.  Hypotension likely sepsis related, source unknown at this time.  Patient off pressors. Blood pressure remains soft   5. Secondary Hyperparathyroidism: with outpatient labs: PTH 52.7, phosphorus 4.1, calcium  8.6 on 7/2.  Lab Results  Component Value Date   CALCIUM  8.0 (L) 05/23/2024   CAION 1.21 08/08/2023    PHOS 4.8 (H) 05/23/2024   Will continue to monitor.    LOS: 9 Kevin Gross 9/26/202512:03 PM

## 2024-05-24 NOTE — Progress Notes (Signed)
 PT Cancellation Note  Patient Details Name: Kevin Gross MRN: [id number] DOB: 06-Jan-1956   Cancelled Treatment:    Reason Eval/Treat Not Completed: Medical issues which prohibited therapy (Most recent vitals show a MAP 54, not appropriate for PT intervention at this time. Will follow and resume once ready.)  8:20 AM, 05/24/24 [name] Linear, PT, DPT Physical Therapist - [facility]  [phone] (ASCOM)    Precious Gilchrest C 05/24/2024, 8:20 AM

## 2024-05-24 NOTE — Progress Notes (Signed)
 Pt discharged home with family in stable condition. DC instructions and meds given. Pt verbalized understanding. No immediate questions or concerns at this time. DC'd from unit via wheelchair.

## 2024-05-24 NOTE — Progress Notes (Signed)
 Hemodialysis Note:  Received patient in bed to unit. Alert and oriented. Informed consent singed and in chart.  Treatment initiated: 1400 Treatment completed: 1655  Access used: Right internal jugular catheter Access issues: None  Patient tolerated well. Transported back to room, alert without acute distress. Report given to patient's RN.  Total UF removed: 1 Liter Medications given: Midodrine  10 mg Tablet  Post HD weight: 113.2 Kg  [facility]

## 2024-05-24 NOTE — Discharge Summary (Signed)
 Physician Discharge Summary   Patient: Kevin Gross MRN: [id number] DOB: 11-Nov-1955  Admit date:     05/15/2024  Discharge date: 05/24/24  Discharge Physician: AIDA CHO   PCP: Autry Grayce LABOR, PA   Recommendations at discharge:   Follow-up with PCP in 1 week Follow-up with nephrologist as an outpatient for outpatient hemodialysis on Tuesdays, Thursdays and Saturdays  Discharge Diagnoses: Principal Problem:   Shock circulatory (HCC) Active Problems:   Acute renal failure   Community acquired pneumonia of right lower lobe of lung   Alcoholic cirrhosis of liver with ascites (HCC)   Portal vein thrombosis   Peripheral arterial disease   Hypertension   Diarrhea   Anemia   Hyperlipidemia   Generalized weakness   Encounter for dialysis and dialysis catheter care  Resolved Problems:   * No resolved hospital problems. *  Hospital Course:   Kevin Gross is a 68 y.o. male with medical history significant for hypertension, hyperlipidemia, CHF, CKD, iron  deficiency anemia, PUD, alcohol use disorder, liver cirrhosis, chronic bradycardia, who presented to the hospital with shortness of breath, general weakness, poor oral intake, nausea, vomiting and diarrhea.  Patient reported abstinence from alcohol and tobacco use.   He was bradycardic and hypotensive on admission.  He has chronic bradycardia.  He was given IV fluids without any response in blood pressure.  He was subsequently started on vasopressors and admitted to the ICU.  Septic shock was ruled out.  He was thought to have circulatory shock secondary to decompensated liver cirrhosis.  No growth on blood cultures of ascitic fluid.  He had paracentesis with removal of 4.8 L of fluid on 05/16/2024.   Liver ultrasound showed portal vein thrombosis.  He treated with IV vitamin K .  He was also treated with IV heparin  drip, IV octreotide  infusion and IV heparin  drip.  Gastroenterologist recommended anticoagulation for at least 6  months with cross-sectional imaging every 3 months to assess response.  Outpatient colonoscopy was recommended as well with GI signed off the case.   He was also started on hemodialysis for ESRD.   He was transferred to [facility] hospitalist service on 05/19/2024.        Assessment and Plan:  Circulatory shock: Exact etiology unclear but volume depletion and decompensated liver cirrhosis suspected.  Sepsis ruled out. He is off of IV vasopressors.  Continue midodrine  for chronic hypotension.     Leukocytosis: Slowly improving, 15.6-14.2.  Etiology unclear.  Monitor CBC. Previously treated with antibiotics.     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 S/p placement of right IJ tunneled dialysis catheter on 05/21/2024. He underwent hemodialysis today.  Outpatient hemodialysis chair has been confirmed at [facility] and Tuesdays, Thursdays and Saturdays at 11 AM.     Left-sided pleural effusion with lower lobe atelectasis: This is probably from liver cirrhosis.  Of note, patient was treated empirically with IV antibiotics for suspected pneumonia.     Decompensated liver cirrhosis: S/p treatment with IV octreotide  infusion. Portal vein thrombosis on liver ultrasound: S/p treatment with IV heparin  drip.  Continue Eliquis .  At least 6 months of anticoagulation recommended.  Repeat imaging in 3 months to assess response.     Diarrhea: Improved.  Stool for C. difficile antigen positive but PCR and toxin were negative.  GI panel was negative.  Initially treated with oral vancomycin  (up to 5 days).     General weakness: PT recommended home health therapy.  PAD s/p bilateral iliac stent placement in March 2023: Resume aspirin  at discharge.  Discontinue Plavix  at discharge.     Thrombocytopenia: Platelet count is stable.     Mild hypokalemia: Improved Metabolic acidosis: Improved Mild hyponatremia: Improved     Comorbidities  include history of alcohol use disorder, hyperlipidemia, chronic anemia          Consultants: Nephrologist, vascular surgeon, gastroenterologist, intensivist Procedures performed: Right IJ tunneled dialysis catheter placement on 05/21/2024   Disposition: Home health Diet recommendation:  Discharge Diet Orders (From admission, onward)     Start     Ordered   05/24/24 0000  Diet - low sodium heart healthy        05/24/24 1559           Renal diet DISCHARGE MEDICATION: Allergies as of 05/24/2024       Reactions   Jardiance [empagliflozin]    Loss of balance, hypoglycemic         Medication List     STOP taking these medications    amoxicillin-clavulanate 500-125 MG tablet Commonly known as: AUGMENTIN   chlorthalidone  25 MG tablet Commonly known as: HYGROTON    clopidogrel  75 MG tablet Commonly known as: PLAVIX    lisinopril  30 MG tablet Commonly known as: ZESTRIL        TAKE these medications    allopurinol  100 MG tablet Commonly known as: ZYLOPRIM  Take 150 mg by mouth daily.   amiodarone  200 MG tablet Commonly known as: PACERONE  Take 200 mg by mouth 2 (two) times daily.   Apixaban  Starter Pack (10mg  and 5mg ) Commonly known as: ELIQUIS  STARTER PACK Take as directed on package: start with two-5mg  tablets twice daily for 7 days. On day 8, switch to one-5mg  tablet twice daily.   aspirin  EC 81 MG tablet Take 81 mg by mouth every morning.   ezetimibe  10 MG tablet Commonly known as: ZETIA  Take 1 tablet (10 mg total) by mouth daily.   iron  polysaccharides 150 MG capsule Commonly known as: NIFEREX Take 1 capsule (150 mg total) by mouth 2 (two) times daily.   midodrine  10 MG tablet Commonly known as: PROAMATINE  Take 1 tablet (10 mg total) by mouth 3 (three) times daily with meals.   pantoprazole  40 MG tablet Commonly known as: PROTONIX  Take 40 mg by mouth daily.   simvastatin  20 MG tablet Commonly known as: ZOCOR  Take 20 mg by mouth in the  morning.   triamcinolone  ointment 0.1 % Commonly known as: KENALOG  Apply 1 Application topically 2 (two) times daily as needed (psoriasis).   Vitamin D3 25 MCG (1000 UT) Caps Take 2,000 Units by mouth daily.   zolpidem  10 MG tablet Commonly known as: AMBIEN  Take 10 mg by mouth at bedtime.        Follow-up Information     Dialysis, [facility] Follow up on 05/28/2024.   Why: Patient outpatient dialysis days ate Tuesday, Thursday, Saturday A 11am.  Patient first day is to arrive at 10:45 for new patient paperwork. Contact information: [address] [place] [address] [phone]                Discharge Exam: Fredricka Weights   05/23/24 1236 05/24/24 0521 05/24/24 1337  Weight: 114.4 kg 114.3 kg 114.1 kg   GEN: NAD SKIN: Warm and dry EYES: No pallor or icterus ENT: MMM CV: RRR PULM: CTA B ABD: soft, obese, NT, +BS CNS: AAO x 3, non focal EXT: Swelling of bilateral upper extremities but swelling in bilateral  legs has improved   Condition at discharge: good  The results of significant diagnostics from this hospitalization (including imaging, microbiology, ancillary and laboratory) are listed below for reference.   Imaging Studies: PERIPHERAL VASCULAR CATHETERIZATION Result Date: 05/21/2024 See surgical note for result.  US  LIVER DOPPLER Result Date: 05/18/2024 CLINICAL DATA:  Cirrhosis and ascites. Status post large volume paracentesis on 05/16/2024. EXAM: DUPLEX ULTRASOUND OF LIVER TECHNIQUE: Color and duplex Doppler ultrasound was performed to evaluate the hepatic in-flow and out-flow vessels. COMPARISON:  CT abdomen 05/15/2024 FINDINGS: Liver: The liver demonstrates cirrhotic morphology with surface nodularity and right lobe atrophy. No focal lesion, mass or intrahepatic biliary ductal dilatation. Main Portal Vein size: 1.6 cm Portal Vein Velocities Main Prox:  23 cm/sec Main Mid: 24 cm/sec Main Dist:  16 cm/sec Right: Not able to be sampled. Left: 13  cm/sec Hepatic Vein Velocities Right:  21 cm/sec Middle:  26 cm/sec Left:  19 cm/sec IVC: Present and patent with normal respiratory phasicity. Hepatic Artery Velocity:  77 cm/sec Splenic Vein Velocity:  10 cm/sec Spleen: 15.7 cm x 14.3 cm x 5.2 cm with a total volume of 614 cm^3 (411 cm^3 is upper limit normal) Portal Vein Occlusion/Thrombus: Yes Splenic Vein Occlusion/Thrombus: No Ascites: Present Varices: None Thrombus identified in the main portal vein that likely extends into the intrahepatic right portal vein. The thrombus appears to be nonocclusive with some flow present in the main portal vein and intrahepatic left portal vein but the thrombus may be on the verge of becoming completely occlusive. No evidence of hepatic veno-occlusive disease. The splenic vein is patent. Small amount of ascites present in the right upper quadrant. There is moderate splenomegaly. IMPRESSION: 1. Cirrhosis of the liver with evidence of portal hypertension including splenomegaly and ascites. 2. 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 The thrombus may be on the verge of becoming completely occlusive. Electronically Signed   By: Marcey Moan M.D.   On: 05/18/2024 10:07   US  Paracentesis Result Date: 05/16/2024 INDICATION: Patient admitted with history of ETOH cirrhosis, CHF who is currently admitted to the ICU for shock/sepsis. Noted to have ascites on recent imaging. Request for diagnostic and therapeutic paracentesis. EXAM: ULTRASOUND GUIDED DIAGNOSTIC AND THERAPEUTIC PARACENTESIS MEDICATIONS: 5 mL 1% lidocaine  COMPLICATIONS: None immediate. PROCEDURE: Informed written consent was obtained from the patient after a discussion of the risks, benefits and alternatives to treatment. A timeout was performed prior to the initiation of the procedure. Initial ultrasound scanning demonstrates a large amount of ascites within the left lower abdominal quadrant. The left  lower abdomen was prepped and draped in the usual sterile fashion. 1% lidocaine  was used for local anesthesia. Following this, a 5 Fr, 7-cm Merit OneStep Centesis catheter was introduced. An ultrasound image was saved for documentation purposes. The paracentesis was performed. The catheter was removed and a dressing was applied. The patient tolerated the procedure well without immediate post procedural complication. FINDINGS: A total of approximately 4.8 L of clear yellow fluid was removed. Samples were sent to the laboratory as requested by the clinical team. IMPRESSION: Successful ultrasound-guided paracentesis yielding 4.8 liters of peritoneal fluid. Performed by Clotilda Hesselbach, PA-C Electronically Signed   By: Juliene Balder M.D.   On: 05/16/2024 16:36   ECHOCARDIOGRAM COMPLETE Result Date: 05/16/2024    ECHOCARDIOGRAM REPORT   Patient Name:   Kevin Gross Heinsohn Date of Exam: 05/16/2024 Medical Rec #:  [id number]       Height:  69.0 in Accession #:    [id number]      Weight:       254.9 lb Date of Birth:  09/23/1955       BSA:          2.290 m Patient Age:    68 years        BP:           103/39 mmHg Patient Gender: M               HR:           57 bpm. Exam Location:  [facility] Procedure: 2D Echo, Cardiac Doppler and Color Doppler (Both Spectral and Color            Flow Doppler were utilized during procedure). Indications:     Congestive heart failure I50.9  History:         Patient has prior history of Echocardiogram examinations, most                  recent 03/09/2024. CHF; Risk Factors:Hypertension and Sleep                  Apnea.  Sonographer:     Christopher Furnace Referring Phys:  [id number] KHABIB DGAYLI Diagnosing Phys: Evalene Lunger MD IMPRESSIONS  1. Left ventricular ejection fraction, by estimation, is 60 to 65%. The left ventricle has normal function. The left ventricle has no regional wall motion abnormalities. The left ventricular internal cavity size was mildly dilated. There is mild left ventricular  hypertrophy. Left ventricular diastolic parameters were normal.  2. Right ventricular systolic function is normal. The right ventricular size is normal. There is normal pulmonary artery systolic pressure. The estimated right ventricular systolic pressure is 26.9 mmHg.  3. Left atrial size was mildly dilated.  4. The mitral valve is normal in structure. No evidence of mitral valve regurgitation. No evidence of mitral stenosis.  5. The aortic valve is normal in structure. Aortic valve regurgitation is not visualized. No aortic stenosis is present.  6. The inferior vena cava is normal in size with greater than 50% respiratory variability, suggesting right atrial pressure of 3 mmHg. FINDINGS  Left Ventricle: Left ventricular ejection fraction, by estimation, is 60 to 65%. The left ventricle has normal function. The left ventricle has no regional wall motion abnormalities. Strain was performed and the global longitudinal strain is indeterminate. The left ventricular internal cavity size was mildly dilated. There is mild left ventricular hypertrophy. Left ventricular diastolic parameters were normal. Right Ventricle: The right ventricular size is normal. No increase in right ventricular wall thickness. Right ventricular systolic function is normal. There is normal pulmonary artery systolic pressure. The tricuspid regurgitant velocity is 2.34 m/s, and  with an assumed right atrial pressure of 5 mmHg, the estimated right ventricular systolic pressure is 26.9 mmHg. Left Atrium: Left atrial size was mildly dilated. Right Atrium: Right atrial size was normal in size. Pericardium: There is no evidence of pericardial effusion. Mitral Valve: The mitral valve is normal in structure. No evidence of mitral valve regurgitation. No evidence of mitral valve stenosis. MV peak gradient, 4.8 mmHg. The mean mitral valve gradient is 2.0 mmHg. Tricuspid Valve: The tricuspid valve is normal in structure. Tricuspid valve regurgitation is mild  . No evidence of tricuspid stenosis. Aortic Valve: The aortic valve is normal in structure. Aortic valve regurgitation is not visualized. No aortic stenosis is present. Aortic valve mean gradient measures 7.0 mmHg. Aortic valve peak gradient  measures 12.0 mmHg. Aortic valve area, by VTI measures 2.21 cm. Pulmonic Valve: The pulmonic valve was normal in structure. Pulmonic valve regurgitation is not visualized. No evidence of pulmonic stenosis. Aorta: The aortic root is normal in size and structure. Venous: The inferior vena cava is normal in size with greater than 50% respiratory variability, suggesting right atrial pressure of 3 mmHg. IAS/Shunts: No atrial level shunt detected by color flow Doppler. Additional Comments: 3D was performed not requiring image post processing on an independent workstation and was indeterminate.  LEFT VENTRICLE PLAX 2D LVIDd:         5.40 cm   Diastology LVIDs:         3.10 cm   LV e' medial:    11.30 cm/s LV PW:         1.10 cm   LV E/e' medial:  9.2 LV IVS:        1.40 cm   LV e' lateral:   18.40 cm/s LVOT diam:     2.00 cm   LV E/e' lateral: 5.7 LV SV:         87 LV SV Index:   38 LVOT Area:     3.14 cm  RIGHT VENTRICLE RV Basal diam:  3.80 cm RV Mid diam:    4.20 cm RV S prime:     14.30 cm/s TAPSE (M-mode): 2.4 cm LEFT ATRIUM           Index        RIGHT ATRIUM           Index LA diam:      3.60 cm 1.57 cm/m   RA Area:     19.30 cm LA Vol (A2C): 48.4 ml 21.13 ml/m  RA Volume:   49.60 ml  21.66 ml/m LA Vol (A4C): 75.8 ml 33.10 ml/m  AORTIC VALVE AV Area (Vmax):    2.25 cm AV Area (Vmean):   2.10 cm AV Area (VTI):     2.21 cm AV Vmax:           173.00 cm/s AV Vmean:          119.500 cm/s AV VTI:            0.394 m AV Peak Grad:      12.0 mmHg AV Mean Grad:      7.0 mmHg LVOT Vmax:         124.00 cm/s LVOT Vmean:        79.900 cm/s LVOT VTI:          0.278 m LVOT/AV VTI ratio: 0.70  AORTA Ao Root diam: 3.50 cm MITRAL VALVE                TRICUSPID VALVE MV Area (PHT): 3.70  cm     TR Peak grad:   21.9 mmHg MV Area VTI:   2.07 cm     TR Vmax:        234.00 cm/s MV Peak grad:  4.8 mmHg MV Mean grad:  2.0 mmHg     SHUNTS MV Vmax:       1.10 m/s     Systemic VTI:  0.28 m MV Vmean:      62.8 cm/s    Systemic Diam: 2.00 cm MV Decel Time: 205 msec MV E velocity: 104.00 cm/s MV A velocity: 76.50 cm/s MV E/A ratio:  1.36 Evalene Lunger MD Electronically signed by Evalene Lunger MD Signature Date/Time: 05/16/2024/2:54:52 PM    Final  CT ABDOMEN PELVIS WO CONTRAST Result Date: 05/16/2024 CLINICAL DATA:  Right lower quadrant pain EXAM: CT ABDOMEN AND PELVIS WITHOUT CONTRAST TECHNIQUE: Multidetector CT imaging of the abdomen and pelvis was performed following the standard protocol without IV contrast. RADIATION DOSE REDUCTION: This exam was performed according to the departmental dose-optimization program which includes automated exposure control, adjustment of the mA and/or kV according to patient size and/or use of iterative reconstruction technique. COMPARISON:  None Available. FINDINGS: Lower chest: Small left-sided pleural effusion is noted with left basilar atelectasis. Hepatobiliary: Liver is shrunken and nodular consistent with underlying cirrhotic change. Gallbladder is well distended with dependent gallstones. No biliary ductal dilatation is seen. Pancreas: Unremarkable. No pancreatic ductal dilatation or surrounding inflammatory changes. Spleen: Normal in size without focal abnormality. Adrenals/Urinary Tract: Adrenal glands are within normal limits. Kidneys are well visualized bilaterally. No renal calculi or obstructive changes are noted. Bladder is partially distended. Stomach/Bowel: No obstructive or inflammatory changes of the colon are noted. Scattered diverticular changes seen without evidence of diverticulitis. The appendix is not well visualized. No inflammatory changes to suggest appendicitis are noted. Small bowel and stomach are within normal limits. Vascular/Lymphatic:  Aortic atherosclerosis. No enlarged abdominal or pelvic lymph nodes. Reproductive: Prostate is unremarkable.  Therapy seeds are noted. Other: Mild to moderate ascites is noted. Musculoskeletal: Degenerative changes of the lumbar spine are noted. IMPRESSION: Changes consistent with cirrhotic change of the liver with associated ascites. Left-sided pleural effusion with lower lobe atelectasis. Diverticular change. Electronically Signed   By: Oneil Devonshire M.D.   On: 05/16/2024 00:01   US  Renal Result Date: 05/15/2024 CLINICAL DATA:  Acute renal insufficiency. EXAM: RENAL / URINARY TRACT ULTRASOUND COMPLETE COMPARISON:  None Available. FINDINGS: Evaluation is limited due to body habitus and ascites. Right Kidney: Renal measurements: 10.7 x 6.5 x 5.6 cm = volume: 202 mL. Mild parenchyma atrophy and increased echogenicity. No hydronephrosis or shadowing stone. Two small hyperechoic lesions measure up to 1.3 cm suboptimally evaluated due to small size, possibly cysts. Left Kidney: Renal measurements: 10.4 x 6.6 x 5.7 cm = volume: 204 mL. Mild parenchyma atrophy and increased echogenicity. No hydronephrosis or shadowing stone. A 2.7 cm hypoechoic lesion in the upper pole is suboptimally evaluated, likely a cyst. Bladder: The urinary bladder is not visualized. Other: There is a small ascites. IMPRESSION: 1. Mild parenchyma atrophy and increased echogenicity. No hydronephrosis or shadowing stone. 2. Probable small bilateral renal cysts. 3. Small ascites. Electronically Signed   By: Vanetta Chou M.D.   On: 05/15/2024 18:28   DG Chest 2 View Result Date: 05/15/2024 CLINICAL DATA:  Shortness of breath and dizziness for 1 week. EXAM: DG CHEST 2V COMPARISON:  May 03, 2024. FINDINGS: The heart size and mediastinal contours are within normal limits. Small bilateral pleural effusions are noted. No consolidative process is noted. The visualized skeletal structures are unremarkable. IMPRESSION: Small bilateral pleural  effusions. Electronically Signed   By: Lynwood Landy Raddle M.D.   On: 05/15/2024 16:15   DG Chest 2 View Result Date: 05/11/2024 CLINICAL DATA:  Chest pain and bronchitis. EXAM: CHEST - 2 VIEW COMPARISON:  Chest radiograph dated 03/08/2024. FINDINGS: Left lung base atelectasis or scarring versus pneumonia. The right lung is clear. No pleural effusion pneumothorax. The cardiac silhouette is within normal limits. Atherosclerotic calcification of the aorta. No acute osseous pathology. IMPRESSION: Left lung base atelectasis versus pneumonia. Electronically Signed   By: Vanetta Chou M.D.   On: 05/11/2024 20:42    Microbiology: Results for  orders placed or performed during the hospital encounter of 05/15/24  MRSA Next Gen by PCR, Nasal     Status: None   Collection Time: 05/15/24  8:57 PM   Specimen: Nasal Mucosa; Nasal Swab  Result Value Ref Range Status   MRSA by PCR Next Gen NOT DETECTED NOT DETECTED Final    Comment: (NOTE) The GeneXpert MRSA Assay (FDA approved for NASAL specimens only), is one component of a comprehensive MRSA colonization surveillance program. It is not intended to diagnose MRSA infection nor to guide or monitor treatment for MRSA infections. Test performance is not FDA approved in patients less than 94 years old. Performed at [facility], [address] [place], [place] [address]   SARS Coronavirus 2 by RT PCR (hospital order, performed in [facility] hospital lab) *cepheid single result test* Anterior Nasal Swab     Status: None   Collection Time: 05/15/24  8:58 PM   Specimen: Anterior Nasal Swab  Result Value Ref Range Status   SARS Coronavirus 2 by RT PCR NEGATIVE NEGATIVE Final    Comment: (NOTE) SARS-CoV-2 target nucleic acids are NOT DETECTED.  The SARS-CoV-2 RNA is generally detectable in upper and lower respiratory specimens during the acute phase of infection. The lowest concentration of SARS-CoV-2 viral copies this assay can detect is  250 copies / mL. A negative result does not preclude SARS-CoV-2 infection and should not be used as the sole basis for treatment or other patient management decisions.  A negative result may occur with improper specimen collection / handling, submission of specimen other than nasopharyngeal swab, presence of viral mutation(s) within the areas targeted by this assay, and inadequate number of viral copies (<250 copies / mL). A negative result must be combined with clinical observations, patient history, and epidemiological information.  Fact Sheet for Patients:   RoadLapTop.co.za  Fact Sheet for Healthcare Providers: http://kim-miller.com/  This test is not yet approved or  cleared by the [place]  FDA and has been authorized for detection and/or diagnosis of SARS-CoV-2 by FDA under an Emergency Use Authorization (EUA).  This EUA will remain in effect (meaning this test can be used) for the duration of the COVID-19 declaration under Section 564(b)(1) of the Act, 21 U.S.C. section 360bbb-3(b)(1), unless the authorization is terminated or revoked sooner.  Performed at [facility], [address] [place], [place] [address]   Body fluid culture w Gram Stain     Status: None   Collection Time: 05/16/24  2:50 PM   Specimen: PATH Cytology Peritoneal fluid  Result Value Ref Range Status   Specimen Description   Final    PERITONEAL Performed at [facility], [address] [place], [place] [address]    Special Requests   Final    NONE Performed at [facility], [address] [place], [place] [address]    Gram Stain   Final    WBC PRESENT, PREDOMINANTLY MONONUCLEAR NO ORGANISMS SEEN CYTOSPIN SMEAR    Culture   Final    NO GROWTH 3 DAYS Performed at [facility], [address] [place], [place] [address]    Report Status 05/20/2024 FINAL  Final  Culture, blood (Routine X 2) w Reflex to ID  Panel     Status: None   Collection Time: 05/16/24  6:15 PM   Specimen: BLOOD  Result Value Ref Range Status   Specimen Description BLOOD BLOOD RIGHT ARM  Final   Special Requests   Final  BOTTLES DRAWN AEROBIC AND ANAEROBIC Blood Culture adequate volume   Culture   Final    NO GROWTH 5 DAYS Performed at [facility], [address] [place], [place] [address]    Report Status 05/21/2024 FINAL  Final  Culture, blood (Routine X 2) w Reflex to ID Panel     Status: None   Collection Time: 05/16/24  6:28 PM   Specimen: BLOOD  Result Value Ref Range Status   Specimen Description BLOOD BLOOD RIGHT ARM  Final   Special Requests   Final    BOTTLES DRAWN AEROBIC AND ANAEROBIC Blood Culture adequate volume   Culture   Final    NO GROWTH 5 DAYS Performed at [facility] [facility], [address] [place], [place] [address]    Report Status 05/21/2024 FINAL  Final  Gastrointestinal Panel by PCR , Stool     Status: None   Collection Time: 05/17/24  3:20 AM   Specimen: Stool  Result Value Ref Range Status   Campylobacter species NOT DETECTED NOT DETECTED Final   Plesimonas shigelloides NOT DETECTED NOT DETECTED Final   Salmonella species NOT DETECTED NOT DETECTED Final   Yersinia enterocolitica NOT DETECTED NOT DETECTED Final   Vibrio species NOT DETECTED NOT DETECTED Final   Vibrio cholerae NOT DETECTED NOT DETECTED Final   Enteroaggregative E coli (EAEC) NOT DETECTED NOT DETECTED Final   Enteropathogenic E coli (EPEC) NOT DETECTED NOT DETECTED Final   Enterotoxigenic E coli (ETEC) NOT DETECTED NOT DETECTED Final   Shiga like toxin producing E coli (STEC) NOT DETECTED NOT DETECTED Final   Shigella/Enteroinvasive E coli (EIEC) NOT DETECTED NOT DETECTED Final   Cryptosporidium NOT DETECTED NOT DETECTED Final   Cyclospora cayetanensis NOT DETECTED NOT DETECTED Final   Entamoeba histolytica NOT DETECTED NOT DETECTED Final   Giardia lamblia NOT DETECTED NOT DETECTED Final    Adenovirus F40/41 NOT DETECTED NOT DETECTED Final   Astrovirus NOT DETECTED NOT DETECTED Final   Norovirus GI/GII NOT DETECTED NOT DETECTED Final   Rotavirus A NOT DETECTED NOT DETECTED Final   Sapovirus (I, II, IV, and V) NOT DETECTED NOT DETECTED Final    Comment: Performed at [facility], [address] [place], [place] [address]  C Difficile Quick Screen w PCR reflex     Status: Abnormal   Collection Time: 05/17/24  4:34 PM   Specimen: STOOL  Result Value Ref Range Status   C Diff antigen POSITIVE (A) NEGATIVE Final   C Diff toxin NEGATIVE NEGATIVE Final   C Diff interpretation Results are indeterminate. See PCR results.  Final    Comment: Performed at [facility], [address] [place], [place] [address]  C. Diff by PCR, Reflexed     Status: None   Collection Time: 05/17/24  4:34 PM  Result Value Ref Range Status   Toxigenic C. Difficile by PCR NEGATIVE NEGATIVE Final    Comment: Patient is colonized with non toxigenic C. difficile. May not need treatment unless significant symptoms are present.   Hypervirulent Strain PRESUMPTIVE NEGATIVE PRESUMPTIVE NEGATIVE Final    Comment: Performed at [facility], [address] [place], [place] [address]    Labs: CBC: Recent Labs  Lab 05/20/24 [phone] 05/21/24 0456 05/22/24 1129 05/23/24 0645 05/24/24 0435  WBC 10.4 15.5* 15.6* 14.2* 11.4*  HGB 10.1* 10.6* 11.1* 10.5* 10.0*  HCT 31.0* 32.7* 34.3* 32.6* 31.1*  MCV 100.6* 100.9* 102.4* 101.9* 101.3*  PLT 114* 146* 187 121* 116*  Basic Metabolic Panel: Recent Labs  Lab 05/18/24 0447 05/19/24 0435 05/20/24 0313 05/21/24 0456 05/22/24 1129 05/23/24 0645 05/24/24 1404  NA 140 136 139 135 134* 136 135  K 3.8 3.6 3.8 3.4* 3.5 4.4 3.5  CL 101 100 102 101 100 100 98  CO2 22 26 26 22  21* 24 24  GLUCOSE 112* 110* 114* 120* 130* 117* 121*  BUN 64* 47* 58* 74* 63* 67* 49*  CREATININE 5.73* 4.65* 5.32* 6.09* 5.37* 6.82* 5.39*  CALCIUM  8.0* 7.7*  7.8* 7.9* 8.0* 8.0* 8.0*  MG 1.9 1.8 1.7 2.2  --   --   --   PHOS 4.5  --   --   --   --  4.8* 5.3*   Liver Function Tests: Recent Labs  Lab 05/21/24 0456 05/24/24 1404  AST 59*  --   ALT 31  --   ALKPHOS 97  --   BILITOT 1.3*  --   PROT 5.2*  --   ALBUMIN  2.4* 2.7*   CBG: No results for input(s): GLUCAP in the last 168 hours.  Discharge time spent: greater than 30 minutes.  Signed: AIDA CHO, MD [facility] 05/24/2024

## 2024-05-24 NOTE — Progress Notes (Signed)
 Mobility Specialist - Progress Note   05/24/24 1218  Mobility  Activity Pivoted/transferred from bed to chair  Level of Assistance Standby assist, set-up cues, supervision of patient - no hands on  Assistive Device None (utilizing chair arms)  Distance Ambulated (ft) 4 ft  Activity Response Tolerated well  Mobility visit 1 Mobility  Mobility Specialist Start Time (ACUTE ONLY) 1208  Mobility Specialist Stop Time (ACUTE ONLY) 1217  Mobility Specialist Time Calculation (min) (ACUTE ONLY) 9 min   Pt transferred bed to chair via SPT, utilizing the arms of the chair for support--- sits prematurely, requiring CGA for hips during descent. Pt left seated with alarm set and needs within reach.  America Silvan Mobility Specialist 05/24/24 12:21 PM

## 2024-05-24 NOTE — Progress Notes (Deleted)
 Attempted to call report  x2 to [facility]. Unable to reach nurse. Will notify transport.

## 2024-05-24 NOTE — Plan of Care (Signed)
  Problem: Education: Goal: Knowledge of General Education information will improve Description: Including pain rating scale, medication(s)/side effects and non-pharmacologic comfort measures Outcome: Progressing   Problem: Health Behavior/Discharge Planning: Goal: Ability to manage health-related needs will improve Outcome: Progressing   Problem: Clinical Measurements: Goal: Ability to maintain clinical measurements within normal limits will improve Outcome: Progressing Goal: Will remain free from infection Outcome: Progressing Goal: Diagnostic test results will improve Outcome: Progressing Goal: Respiratory complications will improve Outcome: Progressing Goal: Cardiovascular complication will be avoided Outcome: Progressing   Problem: Activity: Goal: Risk for activity intolerance will decrease Outcome: Progressing   Problem: Nutrition: Goal: Adequate nutrition will be maintained Outcome: Progressing   Problem: Coping: Goal: Level of anxiety will decrease Outcome: Progressing   Problem: Elimination: Goal: Will not experience complications related to bowel motility Outcome: Progressing Goal: Will not experience complications related to urinary retention Outcome: Progressing   Problem: Pain Managment: Goal: General experience of comfort will improve and/or be controlled Outcome: Progressing   Problem: Safety: Goal: Ability to remain free from injury will improve Outcome: Progressing   Problem: Skin Integrity: Goal: Risk for impaired skin integrity will decrease Outcome: Progressing   Problem: Education: Goal: Knowledge of disease and its progression will improve Outcome: Progressing   Problem: Health Behavior/Discharge Planning: Goal: Ability to manage health-related needs will improve Outcome: Progressing   Problem: Clinical Measurements: Goal: Complications related to the disease process or treatment will be avoided or minimized Outcome:  Progressing Goal: Dialysis access will remain free of complications Outcome: Progressing   Problem: Activity: Goal: Activity intolerance will improve Outcome: Progressing   Problem: Fluid Volume: Goal: Fluid volume balance will be maintained or improved Outcome: Progressing   Problem: Nutritional: Goal: Ability to make appropriate dietary choices will improve Outcome: Progressing   Problem: Respiratory: Goal: Respiratory symptoms related to disease process will be avoided Outcome: Progressing   Problem: Self-Concept: Goal: Body image disturbance will be avoided or minimized Outcome: Progressing   Problem: Urinary Elimination: Goal: Progression of disease will be identified and treated Outcome: Progressing

## 2024-05-24 NOTE — TOC Progression Note (Signed)
 Transition of Care [facility]) - Progression Note    Patient Details  Name: ADRIANA LINA MRN: [id number] Date of Birth: 07/17/1956  Transition of [facility]) CM/SW Contact  Alfonso Rummer, LCSW Phone Number: 05/24/2024, 1:04 PM  Clinical Narrative:     KEN DELENA Rummer spk with pt regardind dx recommendations. Pt is agreeable to home health pt/ot. Kelly with [facility] accepted referral and will follow pt for services to begin Monday.     Barriers to Discharge: Continued Medical Work up               Expected Discharge Plan and Services                                               Social Drivers of Health (SDOH) Interventions SDOH Screenings   Food Insecurity: No Food Insecurity (05/15/2024)  Housing: Low Risk  (05/15/2024)  Transportation Needs: No Transportation Needs (05/15/2024)  Utilities: Not At Risk (05/15/2024)  Depression (PHQ2-9): Low Risk  (03/14/2024)  Financial Resource Strain: Low Risk  (07/17/2023)  Social Connections: Moderately Isolated (05/15/2024)  Tobacco Use: Medium Risk (05/18/2024)    Readmission Risk Interventions    05/16/2024    1:46 PM 09/13/2022   11:09 AM 12/24/2021   12:03 PM  Readmission Risk Prevention Plan  Transportation Screening Complete Complete Complete  PCP or Specialist Appt within 5-7 Days   Complete  PCP or Specialist Appt within 3-5 Days Complete    Home Care Screening  Patient refused Complete  Medication Review (RN CM)  Complete Complete  Palliative Care Screening Not Applicable    Medication Review (RN Care Manager) Complete

## 2024-05-27 ENCOUNTER — Ambulatory Visit: Admission: RE | Admit: 2024-05-27 | Source: Home / Self Care

## 2024-05-27 SURGERY — COLONOSCOPY
Anesthesia: General

## 2024-05-29 ENCOUNTER — Inpatient Hospital Stay

## 2024-05-29 ENCOUNTER — Emergency Department

## 2024-05-29 ENCOUNTER — Inpatient Hospital Stay
Admission: EM | Admit: 2024-05-29 | Discharge: 2024-06-29 | DRG: 871 | Disposition: E | Attending: Student in an Organized Health Care Education/Training Program | Admitting: Student in an Organized Health Care Education/Training Program

## 2024-05-29 DIAGNOSIS — L03116 Cellulitis of left lower limb: Secondary | ICD-10-CM | POA: Diagnosis present

## 2024-05-29 DIAGNOSIS — J690 Pneumonitis due to inhalation of food and vomit: Secondary | ICD-10-CM | POA: Diagnosis present

## 2024-05-29 DIAGNOSIS — R7401 Elevation of levels of liver transaminase levels: Secondary | ICD-10-CM

## 2024-05-29 DIAGNOSIS — K7030 Alcoholic cirrhosis of liver without ascites: Secondary | ICD-10-CM | POA: Diagnosis not present

## 2024-05-29 DIAGNOSIS — J9601 Acute respiratory failure with hypoxia: Secondary | ICD-10-CM | POA: Diagnosis not present

## 2024-05-29 DIAGNOSIS — R54 Age-related physical debility: Secondary | ICD-10-CM | POA: Diagnosis present

## 2024-05-29 DIAGNOSIS — J189 Pneumonia, unspecified organism: Secondary | ICD-10-CM | POA: Diagnosis present

## 2024-05-29 DIAGNOSIS — Z7189 Other specified counseling: Secondary | ICD-10-CM | POA: Diagnosis not present

## 2024-05-29 DIAGNOSIS — F1011 Alcohol abuse, in remission: Secondary | ICD-10-CM | POA: Diagnosis not present

## 2024-05-29 DIAGNOSIS — K921 Melena: Secondary | ICD-10-CM | POA: Diagnosis not present

## 2024-05-29 DIAGNOSIS — A599 Trichomoniasis, unspecified: Secondary | ICD-10-CM | POA: Diagnosis not present

## 2024-05-29 DIAGNOSIS — K7210 Chronic hepatic failure without coma: Secondary | ICD-10-CM | POA: Diagnosis present

## 2024-05-29 DIAGNOSIS — E441 Mild protein-calorie malnutrition: Secondary | ICD-10-CM | POA: Diagnosis present

## 2024-05-29 DIAGNOSIS — R570 Cardiogenic shock: Secondary | ICD-10-CM | POA: Diagnosis not present

## 2024-05-29 DIAGNOSIS — I132 Hypertensive heart and chronic kidney disease with heart failure and with stage 5 chronic kidney disease, or end stage renal disease: Secondary | ICD-10-CM | POA: Diagnosis present

## 2024-05-29 DIAGNOSIS — T45515A Adverse effect of anticoagulants, initial encounter: Secondary | ICD-10-CM | POA: Diagnosis present

## 2024-05-29 DIAGNOSIS — E872 Acidosis, unspecified: Secondary | ICD-10-CM | POA: Diagnosis present

## 2024-05-29 DIAGNOSIS — K922 Gastrointestinal hemorrhage, unspecified: Secondary | ICD-10-CM | POA: Diagnosis not present

## 2024-05-29 DIAGNOSIS — I5032 Chronic diastolic (congestive) heart failure: Secondary | ICD-10-CM | POA: Diagnosis not present

## 2024-05-29 DIAGNOSIS — I81 Portal vein thrombosis: Secondary | ICD-10-CM | POA: Diagnosis not present

## 2024-05-29 DIAGNOSIS — I5030 Unspecified diastolic (congestive) heart failure: Secondary | ICD-10-CM | POA: Diagnosis not present

## 2024-05-29 DIAGNOSIS — I480 Paroxysmal atrial fibrillation: Secondary | ICD-10-CM | POA: Diagnosis present

## 2024-05-29 DIAGNOSIS — D696 Thrombocytopenia, unspecified: Secondary | ICD-10-CM | POA: Diagnosis present

## 2024-05-29 DIAGNOSIS — Z789 Other specified health status: Secondary | ICD-10-CM

## 2024-05-29 DIAGNOSIS — A419 Sepsis, unspecified organism: Principal | ICD-10-CM | POA: Diagnosis present

## 2024-05-29 DIAGNOSIS — Z66 Do not resuscitate: Secondary | ICD-10-CM | POA: Diagnosis not present

## 2024-05-29 DIAGNOSIS — Z7901 Long term (current) use of anticoagulants: Secondary | ICD-10-CM

## 2024-05-29 DIAGNOSIS — R001 Bradycardia, unspecified: Secondary | ICD-10-CM | POA: Diagnosis present

## 2024-05-29 DIAGNOSIS — Z87891 Personal history of nicotine dependence: Secondary | ICD-10-CM

## 2024-05-29 DIAGNOSIS — Z1152 Encounter for screening for COVID-19: Secondary | ICD-10-CM

## 2024-05-29 DIAGNOSIS — G4733 Obstructive sleep apnea (adult) (pediatric): Secondary | ICD-10-CM | POA: Diagnosis present

## 2024-05-29 DIAGNOSIS — R578 Other shock: Secondary | ICD-10-CM | POA: Diagnosis present

## 2024-05-29 DIAGNOSIS — Z888 Allergy status to other drugs, medicaments and biological substances status: Secondary | ICD-10-CM

## 2024-05-29 DIAGNOSIS — Z79899 Other long term (current) drug therapy: Secondary | ICD-10-CM

## 2024-05-29 DIAGNOSIS — B3781 Candidal esophagitis: Secondary | ICD-10-CM | POA: Diagnosis not present

## 2024-05-29 DIAGNOSIS — N179 Acute kidney failure, unspecified: Secondary | ICD-10-CM | POA: Diagnosis present

## 2024-05-29 DIAGNOSIS — N186 End stage renal disease: Secondary | ICD-10-CM | POA: Diagnosis present

## 2024-05-29 DIAGNOSIS — J439 Emphysema, unspecified: Secondary | ICD-10-CM | POA: Diagnosis present

## 2024-05-29 DIAGNOSIS — K264 Chronic or unspecified duodenal ulcer with hemorrhage: Secondary | ICD-10-CM | POA: Diagnosis present

## 2024-05-29 DIAGNOSIS — N2581 Secondary hyperparathyroidism of renal origin: Secondary | ICD-10-CM | POA: Diagnosis present

## 2024-05-29 DIAGNOSIS — E669 Obesity, unspecified: Secondary | ICD-10-CM | POA: Diagnosis present

## 2024-05-29 DIAGNOSIS — I820 Budd-Chiari syndrome: Secondary | ICD-10-CM | POA: Diagnosis not present

## 2024-05-29 DIAGNOSIS — Z992 Dependence on renal dialysis: Secondary | ICD-10-CM

## 2024-05-29 DIAGNOSIS — A598 Trichomoniasis of other sites: Secondary | ICD-10-CM | POA: Diagnosis present

## 2024-05-29 DIAGNOSIS — Z86718 Personal history of other venous thrombosis and embolism: Secondary | ICD-10-CM

## 2024-05-29 DIAGNOSIS — K648 Other hemorrhoids: Secondary | ICD-10-CM | POA: Diagnosis present

## 2024-05-29 DIAGNOSIS — R571 Hypovolemic shock: Secondary | ICD-10-CM | POA: Diagnosis present

## 2024-05-29 DIAGNOSIS — E785 Hyperlipidemia, unspecified: Secondary | ICD-10-CM | POA: Diagnosis present

## 2024-05-29 DIAGNOSIS — Z6832 Body mass index (BMI) 32.0-32.9, adult: Secondary | ICD-10-CM

## 2024-05-29 DIAGNOSIS — K7031 Alcoholic cirrhosis of liver with ascites: Secondary | ICD-10-CM | POA: Diagnosis present

## 2024-05-29 DIAGNOSIS — D6832 Hemorrhagic disorder due to extrinsic circulating anticoagulants: Secondary | ICD-10-CM | POA: Diagnosis present

## 2024-05-29 DIAGNOSIS — B370 Candidal stomatitis: Secondary | ICD-10-CM | POA: Diagnosis present

## 2024-05-29 DIAGNOSIS — D631 Anemia in chronic kidney disease: Secondary | ICD-10-CM | POA: Diagnosis present

## 2024-05-29 DIAGNOSIS — Z515 Encounter for palliative care: Secondary | ICD-10-CM | POA: Diagnosis not present

## 2024-05-29 DIAGNOSIS — E861 Hypovolemia: Secondary | ICD-10-CM | POA: Diagnosis present

## 2024-05-29 DIAGNOSIS — I739 Peripheral vascular disease, unspecified: Secondary | ICD-10-CM | POA: Diagnosis present

## 2024-05-29 DIAGNOSIS — L89156 Pressure-induced deep tissue damage of sacral region: Secondary | ICD-10-CM | POA: Diagnosis present

## 2024-05-29 DIAGNOSIS — D509 Iron deficiency anemia, unspecified: Secondary | ICD-10-CM | POA: Diagnosis not present

## 2024-05-29 DIAGNOSIS — D649 Anemia, unspecified: Secondary | ICD-10-CM | POA: Diagnosis not present

## 2024-05-29 DIAGNOSIS — M1A9XX0 Chronic gout, unspecified, without tophus (tophi): Secondary | ICD-10-CM | POA: Diagnosis present

## 2024-05-29 DIAGNOSIS — K5730 Diverticulosis of large intestine without perforation or abscess without bleeding: Secondary | ICD-10-CM | POA: Diagnosis present

## 2024-05-29 DIAGNOSIS — Z7982 Long term (current) use of aspirin: Secondary | ICD-10-CM

## 2024-05-29 DIAGNOSIS — E7220 Disorder of urea cycle metabolism, unspecified: Secondary | ICD-10-CM | POA: Diagnosis not present

## 2024-05-29 DIAGNOSIS — G9341 Metabolic encephalopathy: Secondary | ICD-10-CM | POA: Diagnosis not present

## 2024-05-29 DIAGNOSIS — R6521 Severe sepsis with septic shock: Secondary | ICD-10-CM | POA: Diagnosis present

## 2024-05-29 DIAGNOSIS — Z8249 Family history of ischemic heart disease and other diseases of the circulatory system: Secondary | ICD-10-CM

## 2024-05-29 DIAGNOSIS — I2489 Other forms of acute ischemic heart disease: Secondary | ICD-10-CM | POA: Diagnosis present

## 2024-05-29 DIAGNOSIS — Z8546 Personal history of malignant neoplasm of prostate: Secondary | ICD-10-CM

## 2024-05-29 DIAGNOSIS — D62 Acute posthemorrhagic anemia: Secondary | ICD-10-CM | POA: Diagnosis present

## 2024-05-29 DIAGNOSIS — Z8701 Personal history of pneumonia (recurrent): Secondary | ICD-10-CM

## 2024-05-29 DIAGNOSIS — Z87898 Personal history of other specified conditions: Secondary | ICD-10-CM

## 2024-05-29 DIAGNOSIS — K828 Other specified diseases of gallbladder: Secondary | ICD-10-CM | POA: Diagnosis present

## 2024-05-29 DIAGNOSIS — Z8719 Personal history of other diseases of the digestive system: Secondary | ICD-10-CM

## 2024-05-29 DIAGNOSIS — E8809 Other disorders of plasma-protein metabolism, not elsewhere classified: Secondary | ICD-10-CM | POA: Diagnosis present

## 2024-05-29 DIAGNOSIS — Z636 Dependent relative needing care at home: Secondary | ICD-10-CM

## 2024-05-29 LAB — CBC WITH DIFFERENTIAL/PLATELET
Abs Immature Granulocytes: 0.17 K/uL — ABNORMAL HIGH (ref 0.00–0.07)
Abs Immature Granulocytes: 0.42 K/uL — ABNORMAL HIGH (ref 0.00–0.07)
Basophils Absolute: 0.1 K/uL (ref 0.0–0.1)
Basophils Absolute: 0.2 K/uL — ABNORMAL HIGH (ref 0.0–0.1)
Basophils Relative: 0 %
Basophils Relative: 1 %
Eosinophils Absolute: 0.1 K/uL (ref 0.0–0.5)
Eosinophils Absolute: 0.2 K/uL (ref 0.0–0.5)
Eosinophils Relative: 1 %
Eosinophils Relative: 1 %
HCT: 24.9 % — ABNORMAL LOW (ref 39.0–52.0)
HCT: 24.4 % — ABNORMAL LOW (ref 39.0–52.0)
Hemoglobin: 7.9 g/dL — ABNORMAL LOW (ref 13.0–17.0)
Hemoglobin: 7.7 g/dL — ABNORMAL LOW (ref 13.0–17.0)
Immature Granulocytes: 1 %
Immature Granulocytes: 1 %
Lymphocytes Relative: 4 %
Lymphocytes Relative: 6 %
Lymphs Abs: 0.8 K/uL (ref 0.7–4.0)
Lymphs Abs: 1.8 K/uL (ref 0.7–4.0)
MCH: 33.9 pg (ref 26.0–34.0)
MCH: 33.8 pg (ref 26.0–34.0)
MCHC: 31.7 g/dL (ref 30.0–36.0)
MCHC: 31.6 g/dL (ref 30.0–36.0)
MCV: 106.9 fL — ABNORMAL HIGH (ref 80.0–100.0)
MCV: 107 fL — ABNORMAL HIGH (ref 80.0–100.0)
Monocytes Absolute: 1.3 K/uL — ABNORMAL HIGH (ref 0.1–1.0)
Monocytes Absolute: 2.2 K/uL — ABNORMAL HIGH (ref 0.1–1.0)
Monocytes Relative: 6 %
Monocytes Relative: 7 %
Neutro Abs: 18.6 K/uL — ABNORMAL HIGH (ref 1.7–7.7)
Neutro Abs: 27.5 K/uL — ABNORMAL HIGH (ref 1.7–7.7)
Neutrophils Relative %: 88 %
Neutrophils Relative %: 84 %
Platelets: 117 K/uL — ABNORMAL LOW (ref 150–400)
Platelets: 180 K/uL (ref 150–400)
RBC: 2.33 MIL/uL — ABNORMAL LOW (ref 4.22–5.81)
RBC: 2.28 MIL/uL — ABNORMAL LOW (ref 4.22–5.81)
RDW: 25.1 % — ABNORMAL HIGH (ref 11.5–15.5)
RDW: 25.3 % — ABNORMAL HIGH (ref 11.5–15.5)
Smear Review: NORMAL
Smear Review: NORMAL
WBC: 21.1 K/uL — ABNORMAL HIGH (ref 4.0–10.5)
WBC: 32.3 K/uL — ABNORMAL HIGH (ref 4.0–10.5)
nRBC: 0.5 % — ABNORMAL HIGH (ref 0.0–0.2)
nRBC: 1.1 % — ABNORMAL HIGH (ref 0.0–0.2)

## 2024-05-29 LAB — BLOOD GAS, VENOUS
Acid-base deficit: 2.8 mmol/L — ABNORMAL HIGH (ref 0.0–2.0)
Bicarbonate: 21.9 mmol/L (ref 20.0–28.0)
O2 Saturation: 78.9 %
Patient temperature: 37
pCO2, Ven: 37 mmHg — ABNORMAL LOW (ref 44–60)
pH, Ven: 7.38 (ref 7.25–7.43)
pO2, Ven: 47 mmHg — ABNORMAL HIGH (ref 32–45)

## 2024-05-29 LAB — RESPIRATORY PANEL BY PCR

## 2024-05-29 LAB — COMPREHENSIVE METABOLIC PANEL WITH GFR
ALT: 90 U/L — ABNORMAL HIGH (ref 0–44)
AST: 301 U/L — ABNORMAL HIGH (ref 15–41)
Albumin: 2.3 g/dL — ABNORMAL LOW (ref 3.5–5.0)
Alkaline Phosphatase: 226 U/L — ABNORMAL HIGH (ref 38–126)
Anion gap: 17 — ABNORMAL HIGH (ref 5–15)
BUN: 71 mg/dL — ABNORMAL HIGH (ref 8–23)
CO2: 19 mmol/L — ABNORMAL LOW (ref 22–32)
Calcium: 7.9 mg/dL — ABNORMAL LOW (ref 8.9–10.3)
Chloride: 101 mmol/L (ref 98–111)
Creatinine, Ser: 6.72 mg/dL — ABNORMAL HIGH (ref 0.61–1.24)
GFR, Estimated: 8 mL/min — ABNORMAL LOW (ref >60)
Glucose, Bld: 82 mg/dL (ref 70–99)
Potassium: 3.5 mmol/L (ref 3.5–5.1)
Sodium: 137 mmol/L (ref 135–145)
Total Bilirubin: 3.1 mg/dL — ABNORMAL HIGH (ref 0.0–1.2)
Total Protein: 4.9 g/dL — ABNORMAL LOW (ref 6.5–8.1)

## 2024-05-29 LAB — TROPONIN I (HIGH SENSITIVITY)
Troponin I (High Sensitivity): 68 ng/L — ABNORMAL HIGH (ref <18)
Troponin I (High Sensitivity): 78 ng/L — ABNORMAL HIGH (ref <18)
Troponin I (High Sensitivity): 88 ng/L — ABNORMAL HIGH (ref <18)

## 2024-05-29 LAB — LACTIC ACID, PLASMA
Lactic Acid, Venous: 5.8 mmol/L (ref 0.5–1.9)
Lactic Acid, Venous: 4.5 mmol/L (ref 0.5–1.9)
Lactic Acid, Venous: 3.2 mmol/L (ref 0.5–1.9)

## 2024-05-29 LAB — PROTIME-INR
INR: 6.4 (ref 0.8–1.2)
Prothrombin Time: 59 s — ABNORMAL HIGH (ref 11.4–15.2)

## 2024-05-29 LAB — MAGNESIUM: Magnesium: 2.1 mg/dL (ref 1.7–2.4)

## 2024-05-29 LAB — RESP PANEL BY RT-PCR (RSV, FLU A&B, COVID)  RVPGX2
Influenza A by PCR: NEGATIVE
Influenza B by PCR: NEGATIVE
Resp Syncytial Virus by PCR: NEGATIVE
SARS Coronavirus 2 by RT PCR: NEGATIVE

## 2024-05-29 LAB — GROUP A STREP BY PCR: Group A Strep by PCR: NOT DETECTED

## 2024-05-29 LAB — MRSA NEXT GEN BY PCR, NASAL
MRSA by PCR Next Gen: NOT DETECTED
MRSA by PCR Next Gen: NOT DETECTED

## 2024-05-29 LAB — GLUCOSE, CAPILLARY: Glucose-Capillary: 113 mg/dL — ABNORMAL HIGH (ref 70–99)

## 2024-05-29 LAB — PROCALCITONIN: Procalcitonin: 2.62 ng/mL

## 2024-05-29 MED ORDER — VANCOMYCIN HCL 2000 MG/400ML IV SOLN
2000.0000 mg | Freq: Once | INTRAVENOUS | Status: AC
  Administered 2024-05-29: 2000 mg via INTRAVENOUS
  Filled 2024-05-29: qty 400

## 2024-05-29 MED ORDER — LACTATED RINGERS IV SOLN: INTRAVENOUS | Status: DC

## 2024-05-29 MED ORDER — POLYETHYLENE GLYCOL 3350 17 G PO PACK: 17.0000 g | PACK | Freq: Every day | ORAL | Status: DC | PRN

## 2024-05-29 MED ORDER — VASOPRESSIN 20 UNITS/100 ML INFUSION FOR SHOCK
0.0000 [IU]/min | INTRAVENOUS | Status: DC
  Administered 2024-05-29 – 2024-06-06 (×22): 0.04 [IU]/min via INTRAVENOUS
  Administered 2024-06-07 – 2024-06-08 (×3): 0.03 [IU]/min via INTRAVENOUS
  Administered 2024-06-08: 0.04 [IU]/min via INTRAVENOUS
  Administered 2024-06-08: 0.03 [IU]/min via INTRAVENOUS
  Administered 2024-06-09: 0.04 [IU]/min via INTRAVENOUS
  Filled 2024-05-29 (×32): qty 100

## 2024-05-29 MED ORDER — SODIUM CHLORIDE 0.9 % IV SOLN: 250.0000 mL | INTRAVENOUS | Status: AC

## 2024-05-29 MED ORDER — LACTATED RINGERS IV BOLUS
500.0000 mL | Freq: Once | INTRAVENOUS | Status: AC
  Administered 2024-05-29: 500 mL via INTRAVENOUS

## 2024-05-29 MED ORDER — MIDODRINE HCL 5 MG PO TABS
10.0000 mg | ORAL_TABLET | Freq: Once | ORAL | Status: AC
  Administered 2024-05-29: 10 mg via ORAL
  Filled 2024-05-29: qty 2

## 2024-05-29 MED ORDER — NOREPINEPHRINE 16 MG/250ML-% IV SOLN
0.0000 ug/min | INTRAVENOUS | Status: DC
  Administered 2024-05-29: 24 ug/min via INTRAVENOUS
  Administered 2024-05-30: 33 ug/min via INTRAVENOUS
  Administered 2024-05-30: 26 ug/min via INTRAVENOUS
  Administered 2024-05-30: 36 ug/min via INTRAVENOUS
  Administered 2024-05-31 (×2): 26 ug/min via INTRAVENOUS
  Administered 2024-06-01 (×2): 20 ug/min via INTRAVENOUS
  Administered 2024-06-02: 16 ug/min via INTRAVENOUS
  Administered 2024-06-02: 18 ug/min via INTRAVENOUS
  Administered 2024-06-03: 5 ug/min via INTRAVENOUS
  Administered 2024-06-05: 8 ug/min via INTRAVENOUS
  Administered 2024-06-05: 3 ug/min via INTRAVENOUS
  Administered 2024-06-07: 5 ug/min via INTRAVENOUS
  Administered 2024-06-08: 4 ug/min via INTRAVENOUS
  Administered 2024-06-10: 7 ug/min via INTRAVENOUS
  Administered 2024-06-12: 16 ug/min via INTRAVENOUS
  Administered 2024-06-12 – 2024-06-13 (×2): 38 ug/min via INTRAVENOUS
  Administered 2024-06-13: 40 ug/min via INTRAVENOUS
  Filled 2024-05-29 (×20): qty 250

## 2024-05-29 MED ORDER — NOREPINEPHRINE 4 MG/250ML-% IV SOLN
0.0000 ug/min | INTRAVENOUS | Status: DC
  Administered 2024-05-29: 10 ug/min via INTRAVENOUS
  Filled 2024-05-29: qty 250

## 2024-05-29 MED ORDER — LACTATED RINGERS IV BOLUS
1000.0000 mL | Freq: Once | INTRAVENOUS | Status: AC
  Administered 2024-05-29: 1000 mL via INTRAVENOUS

## 2024-05-29 MED ORDER — CHLORHEXIDINE GLUCONATE CLOTH 2 % EX PADS
6.0000 | MEDICATED_PAD | Freq: Every day | CUTANEOUS | Status: DC
  Administered 2024-05-29 – 2024-06-12 (×14): 6 via TOPICAL
  Filled 2024-05-29: qty 6

## 2024-05-29 MED ORDER — PIPERACILLIN-TAZOBACTAM IN DEX 2-0.25 GM/50ML IV SOLN
2.2500 g | Freq: Three times a day (TID) | INTRAVENOUS | Status: DC
  Administered 2024-05-29 – 2024-05-31 (×5): 2.25 g via INTRAVENOUS
  Filled 2024-05-29 (×6): qty 50

## 2024-05-29 MED ORDER — PIPERACILLIN-TAZOBACTAM 3.375 G IVPB
3.3750 g | Freq: Once | INTRAVENOUS | Status: AC
  Administered 2024-05-29: 3.375 g via INTRAVENOUS
  Filled 2024-05-29: qty 50

## 2024-05-29 MED ORDER — DOCUSATE SODIUM 100 MG PO CAPS: 100.0000 mg | ORAL_CAPSULE | Freq: Two times a day (BID) | ORAL | Status: DC | PRN

## 2024-05-29 MED ORDER — NOREPINEPHRINE 4 MG/250ML-% IV SOLN
0.0000 ug/min | INTRAVENOUS | Status: DC
  Filled 2024-05-29: qty 250

## 2024-05-29 NOTE — H&P (Signed)
 NAME:  KRISTOPH SATTLER, MRN:  [id number], DOB:  [birth year], LOS: 0 ADMISSION DATE:  05/29/2024, CONSULTATION DATE:  05/29/2024 REFERRING MD:  Dr. Claudene, CHIEF COMPLAINT:  Generalized weakness/hypotension   History of Present Illness:  Mr. Reznik is a 68 y.o. male who presented to [facility] ED via EMS from his home to be evaluated for hypotension and weakness. He reports his initial outpatient hemodialysis appointment was 05/28/2024 at [facility]. This initial appointment is following a recent admission at [facility], of which he was discharged on 05/24/24.  His overall weakness began yesterday following his dialysis appointment and has continued today, with the addition of upper URI symptoms to include minor congestion and a sore throat as of this morning. Upon EMS arrival, he was found to have a BP of 64/28, to which he received fluids and BP increased to 94/42. He reported a sore throat and generalized fatigue/weakness that resulted in a fall this morning. He also stated at baseline he has no issue ambulating with a walker.   ED Course:  1.5L of isotonic fluids Started on Midodrine  Transitioned to peripheral vasopressors (Levophed ) for septic shock WBC: 21.1 Lactic Acidosis: 5.8 Procalcitonin: 2.62 Hemoglobin: 7.9 Platelets: 117 Creatinine: 6.72 BUN: 71 AST 301/ALT 90/Alk Phos 226 Total Bilirubin 3.1 Troponin: 68 Blood cultures drawn Respiratory Panel PCR: flu/covid/RSV negative CXR: small left pleural effusion, similar to prior exam Non-Contrast CT Chest/Abdomen/Pelvis ordered due to uncertain etiology Broad Spectrum ABX started: Zosyn  + Vancomycin    PCCM was consulted and examined the patient. Due to his need for peripheral vasopressors and overall poor clinical status, he was admitted into the ICU.   Pertinent Medical History  ETOH Hypertension Hyperlipidemia Congestive Heart Failure End Stage Renal Disease   Significant Hospital Events: Including procedures, antibiotic  start and stop dates in addition to other pertinent events   05/29/24: Started on Levophed  in the ED following refractory hypotension. Midodrine  10mg  x 1. 1.5L fluids given without improvement. Transferred to the ICU for further management. 2L [place] started with improvement in O2.   Interim History / Subjective:  See above under significant hospital events.   Micro Data: 05/29/24: Blood cultures x2 >> 05/29/24: MRSA PCR >> negative 05/29/24: Group A Strep PCR >> negative 05/29/24: Respiratory Panel PCR >> negative 05/29/24: Legionella Urine Antigen >> 05/29/24: Strep Pneumo Urine Antigen >>  Objective    Blood pressure (!) 97/44, pulse (!) 59, temperature 98.1 F (36.7 C), temperature source Oral, resp. rate (!) 21, height 5' 9 (1.753 m), weight 113.2 kg, SpO2 100%.       Intake/Output Summary (Last 24 hours) at 05/29/2024 1451 Last data filed at 05/29/2024 1432 Gross per 24 hour  Intake 1055 ml  Output --  Net 1055 ml   Filed Weights   05/29/24 1259  Weight: 113.2 kg    Examination: General: ill appearing, no acute distress, lying in bed HENT: moist mucous membranes, anicteric, atraumatic, supple, no JVD Lungs: clear and equal lung sounds bilaterally, no wheezing/rales/rhonchi, non-labored Cardiovascular: regular rate, bradycardia, S1 S2, no m/r/g Abdomen: soft, nontender, bowel sounds x 4 Extremities: warm/dry, small erythematous blister of LLE, sporadic bruising of bilateral upper extremities, 3+ pretibial edema Neuro: A/O x4, follows commands, no focal neuro deficits, PERRL GU: deferred  Resolved problem list    Assessment and Plan   #Lactic Acidosis secondary to Septic Shock #Hypovolemic Shock secondary to Volume Depletion & Decompensated Liver Cirrhosis #HFpEF #Chronic Bradycardia - Hx: Hypertension, Hyperlipidemia, prolonged QT, Paroxysmal Atrial Fibrillation - Continuous cardiac monitoring -  MAP goal of >65 - Vasopressors as indicated - Continue Levophed  and  wean as able - Midodrine  10mg  x 1 completed - CVC of left IJV - Mild elevation in troponin likely secondary to demand ischemia (68 >> 78) - Trend lactics (5.8 >> 4.5)   #COPD #OSA on CPAP #Stable Left Pleural Effusion Hx: COPD, OSA on CPAP, former smoker - Supplemental O2 as needed; 2L [place] - Maintain O2 >88-92% given COPD hx - Intermittent CXR and ABG as needed - Duonebs as needed for dyspnea   #Alcoholic Liver Cirrhosis #Transaminitis #Hypoalbuminemia #Portal Vein Thrombosis (Identified during previous admission) Hx: ETOH abuse - Monitor hepatic trends and coags - Diet: NPO - Constipation protocol as needed: Miralax  and Colace - Holding Eliquis  for Portal Vein Thrombosis until active bleed is ruled out   #ESRD requiring Hemodialysis #Acute Kidney Injury Superimposed on CKD Stage 4 - S/P Perma-Cath  #Anion Gap Metabolic Acidosis - Strict I/O - Trend BMP - Replace electrolytes per protocol - Monitor renal function - Diurese as able when renal function permits - Gentle IV hydration (NS) given hypovolemia - Consider Nephrology consult    #Acute on Chronic Iron  Deficiency Anemia #Thrombocytopenia Hx: Iron  Deficiency Anemia - Trend CBC - Monitor H&H - Transfuse if HBG <7 - Monitor for s/sx of bleeding - CT pending to rule out active GI bleed - Hold DVT prophylaxis and home anticoagulants (Eliquis ) until active bleed ruled out   ENDO - Hypo/hyperglycemia protocol - CBG Q4H   #Sepsis of Unknown Etiology #?Possible Cellulitis of LLE - Trend WBC and fever curve - Blood cultures pending - Respiratory Viral Panel pending - Strep Pneumo Urinary Antigen pending - Legionella Urine Antigen pending - Continue broad spectrum ABX: Zosyn   - Discontinued Vancomycin  - Narrow ABX pending culture and sensitivity results - Appreciate pharmacy consult   #Prior ETOH Abuse - Provide supportive care as needed   Labs   CBC: Recent Labs  Lab 05/23/24 0645 05/24/24 0435  05/29/24 1256  WBC 14.2* 11.4* 21.1*  NEUTROABS  --   --  18.6*  HGB 10.5* 10.0* 7.9*  HCT 32.6* 31.1* 24.9*  MCV 101.9* 101.3* 106.9*  PLT 121* 116* 117*    Basic Metabolic Panel: Recent Labs  Lab 05/23/24 0645 05/24/24 1404 05/29/24 1256  NA 136 135 137  K 4.4 3.5 3.5  CL 100 98 101  CO2 24 24 19*  GLUCOSE 117* 121* 82  BUN 67* 49* 71*  CREATININE 6.82* 5.39* 6.72*  CALCIUM  8.0* 8.0* 7.9*  MG  --   --  2.1  PHOS 4.8* 5.3*  --    GFR: Estimated Creatinine Clearance: 13.4 mL/min (A) (by C-G formula based on SCr of 6.72 mg/dL (H)). Recent Labs  Lab 05/23/24 0645 05/24/24 0435 05/29/24 1256  PROCALCITON  --   --  2.62  WBC 14.2* 11.4* 21.1*  LATICACIDVEN  --   --  5.8*    Liver Function Tests: Recent Labs  Lab 05/24/24 1404 05/29/24 1256  AST  --  301*  ALT  --  90*  ALKPHOS  --  226*  BILITOT  --  3.1*  PROT  --  4.9*  ALBUMIN  2.7* 2.3*   No results for input(s): LIPASE, AMYLASE in the last 168 hours. No results for input(s): AMMONIA in the last 168 hours.  ABG    Component Value Date/Time   PHART 7.298 (L) 11/27/2021 0345   PCO2ART 39.7 11/27/2021 0345   PO2ART 116 (H) 11/27/2021 0345   HCO3  14.1 (L) 05/16/2024 0441   TCO2 21 (L) 08/08/2023 0916   ACIDBASEDEF 11.3 (H) 05/16/2024 0441   O2SAT 69.2 05/16/2024 0441     Coagulation Profile: No results for input(s): INR, PROTIME in the last 168 hours.  Cardiac Enzymes: No results for input(s): CKTOTAL, CKMB, CKMBINDEX, TROPONINI in the last 168 hours.  HbA1C: Hgb A1c MFr Bld  Date/Time Value Ref Range Status  11/27/2021 03:28 AM 5.6 4.8 - 5.6 % Final    Comment:    (NOTE) Pre diabetes:          5.7%-6.4%  Diabetes:              >6.4%  Glycemic control for   <7.0% adults with diabetes     CBG: No results for input(s): GLUCAP in the last 168 hours.  Review of Systems:   Review of Systems  Constitutional:  Positive for malaise/fatigue.  HENT:  Positive for  congestion and sore throat.   Respiratory:  Positive for shortness of breath. Negative for cough and wheezing.   Cardiovascular:  Positive for leg swelling. Negative for chest pain and palpitations.  Gastrointestinal:  Negative for abdominal pain, nausea and vomiting.  Neurological:  Negative for loss of consciousness and headaches.     Past Medical History:  He,  has a past medical history of Alcoholic cirrhosis of liver (HCC), Anemia (2024), Cataract (2024), CHF (congestive heart failure) (HCC), Chronic gouty arthritis, CKD (chronic kidney disease), stage III (HCC), Clotting disorder, Complication of anesthesia, COPD (chronic obstructive pulmonary disease) (HCC), Hyperlipidemia, Hypertension, Hyperuricemia, Peripheral arterial disease, Pre-diabetes, Prostate cancer (HCC) (2023), and Sleep apnea.   Surgical History:   Past Surgical History:  Procedure Laterality Date   ABDOMINAL AORTOGRAM W/LOWER EXTREMITY N/A 10/20/2021   Procedure: ABDOMINAL AORTOGRAM W/LOWER EXTREMITY;  Surgeon: Darron Deatrice LABOR, MD;  Location: [facility];  Service: Cardiovascular;  Laterality: N/A;   ABDOMINAL AORTOGRAM W/LOWER EXTREMITY N/A 08/08/2023   Procedure: ABDOMINAL AORTOGRAM W/LOWER EXTREMITY;  Surgeon: Serene Gaile ORN, MD;  Location: [facility];  Service: Cardiovascular;  Laterality: N/A;   ALLOGRAFT APPLICATION Bilateral 12/22/2021   Procedure: ALLOGRAFT APPLICATION;  Surgeon: Serene Gaile ORN, MD;  Location: [facility] OR;  Service: Vascular;  Laterality: Bilateral;   ANGIOPLASTY Left 11/26/2021   Procedure: LEFT INFRARENAL LITHOTRIPSY;  Surgeon: Serene Gaile ORN, MD;  Location: [facility] OR;  Service: Vascular;  Laterality: Left;   APPLICATION OF WOUND VAC Bilateral 12/13/2021   Procedure: APPLICATION OF WOUND VAC;  Surgeon: Lanis Fonda BRAVO, MD;  Location: [facility] OR;  Service: Vascular;  Laterality: Bilateral;   CARDIAC CATHETERIZATION     [facility]   CATARACT EXTRACTION W/PHACO Left 04/04/2023   Procedure:  CATARACT EXTRACTION PHACO AND INTRAOCULAR LENS PLACEMENT (IOC) LEFT 8.71 00:45.5;  Surgeon: Jaye Fallow, MD;  Location: [facility];  Service: Ophthalmology;  Laterality: Left;   CATARACT EXTRACTION W/PHACO Right 04/18/2023   Procedure: CATARACT EXTRACTION PHACO AND INTRAOCULAR LENS PLACEMENT (IOC) RIGHT 4.31 00:8.2;  Surgeon: Jaye Fallow, MD;  Location: [facility];  Service: Ophthalmology;  Laterality: Right;   COLONOSCOPY     COLONOSCOPY WITH PROPOFOL  N/A 01/17/2018   Procedure: COLONOSCOPY WITH PROPOFOL ;  Surgeon: Toledo, Ladell POUR, MD;  Location: [facility];  Service: Gastroenterology;  Laterality: N/A;   COLONOSCOPY WITH PROPOFOL  N/A 09/12/2022   Procedure: COLONOSCOPY WITH PROPOFOL ;  Surgeon: Maryruth Ole DASEN, MD;  Location: [facility];  Service: Endoscopy;  Laterality: N/A;   COLONOSCOPY WITH PROPOFOL  N/A 09/05/2023   Procedure: COLONOSCOPY  WITH PROPOFOL ;  Surgeon: Maryruth Ole DASEN, MD;  Location: [facility];  Service: Endoscopy;  Laterality: N/A;   DIALYSIS/PERMA CATHETER INSERTION Right 05/21/2024   Procedure: DIALYSIS/PERMA CATHETER INSERTION;  Surgeon: Jama Cordella MATSU, MD;  Location: [facility];  Service: Cardiovascular;  Laterality: Right;   ENDARTERECTOMY FEMORAL Bilateral 11/26/2021   Procedure: BILATERAL FEMORAL ENDARTERECTOMY WITH VEIN PATCH ANGIOPLASTY;  Surgeon: Serene Gaile ORN, MD;  Location: [facility];  Service: Vascular;  Laterality: Bilateral;   ENTEROSCOPY N/A 09/05/2023   Procedure: ENTEROSCOPY;  Surgeon: Maryruth Ole DASEN, MD;  Location: [facility];  Service: Endoscopy;  Laterality: N/A;   ESOPHAGOGASTRODUODENOSCOPY (EGD) WITH PROPOFOL  N/A 01/17/2018   Procedure: ESOPHAGOGASTRODUODENOSCOPY (EGD) WITH PROPOFOL ;  Surgeon: Toledo, Ladell POUR, MD;  Location: [facility];  Service: Gastroenterology;  Laterality: N/A;   ESOPHAGOGASTRODUODENOSCOPY (EGD) WITH PROPOFOL  N/A 09/12/2022   Procedure: ESOPHAGOGASTRODUODENOSCOPY  (EGD) WITH PROPOFOL ;  Surgeon: Maryruth Ole DASEN, MD;  Location: [facility];  Service: Endoscopy;  Laterality: N/A;   EYE SURGERY     GROIN DEBRIDEMENT Bilateral 12/16/2021   Procedure: IRRIGATION AND DEBRIDEMENT OF BILATERAL GROINS;  Surgeon: Serene Gaile ORN, MD;  Location: [facility];  Service: Vascular;  Laterality: Bilateral;   INCISION AND DRAINAGE OF WOUND Bilateral 12/13/2021   Procedure: IRRIGATION AND DEBRIDEMENT OF BILATERAL GROIN WOUNDS;  Surgeon: Lanis Fonda BRAVO, MD;  Location: [facility] OR;  Service: Vascular;  Laterality: Bilateral;   INSERTION OF ILIAC STENT Bilateral 11/26/2021   Procedure: BILATERAL ILIAC STENTING USING 82mmX80mm INNOVA STENT ON LEFT ILIAC AND 53mmX59mm, 38mmX79mm VBX AND 29mmX7.5cm VIABAHN STENT ON RIGHT ILIAC;  Surgeon: Serene Gaile ORN, MD;  Location: [facility];  Service: Vascular;  Laterality: Bilateral;   INSERTION OF SEEDS IN PROSTATE     ORIF ANKLE FRACTURE Right    PERIPHERAL INTRAVASCULAR LITHOTRIPSY  08/08/2023   Procedure: PERIPHERAL INTRAVASCULAR LITHOTRIPSY;  Surgeon: Serene Gaile ORN, MD;  Location: [facility];  Service: Cardiovascular;;   PROSTATE BIOPSY N/A 04/01/2021   Procedure: PROSTATE BIOPSY GRAYCE;  Surgeon: Kassie Ozell SAUNDERS, MD;  Location: [facility] ORS;  Service: Urology;  Laterality: N/A;   VEIN HARVEST Bilateral 11/26/2021   Procedure: VEIN HARVEST OF BILATERAL SPAHENOUS VEINS;  Surgeon: Serene Gaile ORN, MD;  Location: [facility];  Service: Vascular;  Laterality: Bilateral;   WOUND DEBRIDEMENT Bilateral 12/22/2021   Procedure: INCISION AND DEBRIDEMENT OF BILATERAL GROINS;  Surgeon: Serene Gaile ORN, MD;  Location: [facility];  Service: Vascular;  Laterality: Bilateral;     Social History:   reports that he quit smoking about 9 years ago. His smoking use included cigarettes. He started smoking about 56 years ago. He has a 47 pack-year smoking history. He has never been exposed to tobacco smoke. He has never used smokeless tobacco. He reports that he does  not currently use alcohol. He reports that he does not use drugs.   Family History:  His family history includes Heart disease in his brother and mother.   Allergies Allergies  Allergen Reactions   Jardiance [Empagliflozin]     Loss of balance, hypoglycemic      Home Medications  Prior to Admission medications   Medication Sig Start Date End Date Taking? Authorizing Provider  allopurinol  (ZYLOPRIM ) 100 MG tablet Take 150 mg by mouth daily.    [provider]  amiodarone  (PACERONE ) 200 MG tablet Take 200 mg by mouth 2 (two) times daily.    [provider]  APIXABAN  (ELIQUIS ) VTE STARTER PACK (10MG  AND 5MG ) Take as directed  on package: start with two-5mg  tablets twice daily for 7 days. On day 8, switch to one-5mg  tablet twice daily. 05/24/24   Jens Durand, MD  aspirin  EC 81 MG tablet Take 81 mg by mouth every morning.    [provider]  Cholecalciferol  (VITAMIN D3) 25 MCG (1000 UT) CAPS Take 2,000 Units by mouth daily.    [provider]  ezetimibe  (ZETIA ) 10 MG tablet Take 1 tablet (10 mg total) by mouth daily. 04/10/18   Gollan, Timothy J, MD  iron  polysaccharides (NIFEREX) 150 MG capsule Take 1 capsule (150 mg total) by mouth 2 (two) times daily. 03/11/24 06/09/24  Jhonny Calvin NOVAK, MD  midodrine  (PROAMATINE ) 10 MG tablet Take 1 tablet (10 mg total) by mouth 3 (three) times daily with meals. 05/24/24   Jens Durand, MD  pantoprazole  (PROTONIX ) 40 MG tablet Take 40 mg by mouth daily.    [provider]  simvastatin  (ZOCOR ) 20 MG tablet Take 20 mg by mouth in the morning.    [provider]  triamcinolone  ointment (KENALOG ) 0.1 % Apply 1 Application topically 2 (two) times daily as needed (psoriasis). 07/18/23   [provider]  zolpidem  (AMBIEN ) 10 MG tablet Take 10 mg by mouth at bedtime.    [provider]     Critical care time: 55 minutes  Sequoyah Ramone, PA-C Pulmonary/Critical Care PCCM Team Contact  Info: [phone]

## 2024-05-29 NOTE — ED Notes (Signed)
 Pt's BP 68/28. MD aware. Midodrine  given.

## 2024-05-29 NOTE — ED Triage Notes (Signed)
 Pt presents to the ED via [facility] from home. Pt was d/c from this facility on fiday. Pt has end stage renal failure and was started on out patient dialysis yesterday. Pt was hypotensive with a BP of 64/28 on EMS arrival. Pt received 500mL of fluids with a BP of 94/42.   CBG 106 98% RA 98.4

## 2024-05-29 NOTE — ED Notes (Signed)
MD at bedside for US IV access

## 2024-05-29 NOTE — Consult Note (Signed)
 CODE SEPSIS - PHARMACY COMMUNICATION  **Broad Spectrum Antibiotics should be administered within 1 hour of Sepsis diagnosis**  Time Code Sepsis Called/Page Received: 1454  Antibiotics Ordered: Vancomycin  2G x1 and Zosyn  3.375G x1   Time of 1st antibiotic administration: 1359  Additional action taken by pharmacy: none   Annabella LOISE Banks ,PharmD Clinical Pharmacist  05/29/2024  3:00 PM

## 2024-05-29 NOTE — Progress Notes (Signed)
[facility] is following sepsis bundle. 

## 2024-05-29 NOTE — Consult Note (Signed)
 WOC Nurse Consult Note: Reason for Consult: sacral wound  Wound type: 1.  Deep Tissue Pressure Injury sacrum and coccyx purple maroon discoloration  2.  Partial thickness skin loss L arm likely r/t trauma scattered pink moist areas  Pressure Injury POA: Yes Measurement: see nursing flowsheet  Wound bed: as above  Drainage (amount, consistency, odor) sacrum dry; serosanguinous L arm  Periwound: ecchymosis L arm  Dressing procedure/placement/frequency: Cleanse sacrum/coccyx with soap and water , dry and apply Xeroform gauze ([id number] [phone]) to wound bed daily and secure with silicone foam.  Cleanse L arm wounds with NS, apply Xeroform gauze and secure with silicone foam.  May lift foam daily to replace xeroform, change foam q3 days and prn soiling.  POC discussed with bedside nurse. Sophronia GEANNIE Amend, RN assistance with this consult.    WOC team will not follow. Re-consult if further needs arise.   Thank you,    Powell Bar MSN, RN-BC, [employer]

## 2024-05-29 NOTE — ED Provider Notes (Signed)
 [facility] Provider Note    Event Date/Time   First MD Initiated Contact with Patient 05/29/24 1251     (approximate)   History   Hypotension   HPI  Kevin Gross is a 68 y.o. male who presents to the ED for evaluation of Hypotension   Review medical DC summary from 9/26.  Obese patient with history of alcohol use disorder, HTN, HLD, CHF and CKD.  Started on hemodialysis for ESRD, anticoagulation due to portal vein thrombosis.  Started on TThS hemodialysis via right tunneled tunneled catheter  Patient presents to the ED from home via EMS for evaluation of generalized weakness.  Went to his first outpatient hemodialysis 2 days ago and completed that session.  Reports generalized fatigue without focal symptoms   Physical Exam   Triage Vital Signs: ED Triage Vitals  Encounter Vitals Group     BP      Girls Systolic BP Percentile      Girls Diastolic BP Percentile      Boys Systolic BP Percentile      Boys Diastolic BP Percentile      Pulse      Resp      Temp      Temp src      SpO2      Weight      Height      Head Circumference      Peak Flow      Pain Score      Pain Loc      Pain Education      Exclude from Growth Chart     Most recent vital signs: Vitals:   05/29/24 1435 05/29/24 1451  BP: (!) 97/44 (!) 106/44  Pulse: (!) 59 (!) 57  Resp: (!) 21 13  Temp:    SpO2: 100% 100%    General: Somnolent, no distress.  CV:  Good peripheral perfusion.  Resp:  Normal effort.  Abd:  No distention.  Soft and nontender MSK:  No deformity noted.  Neuro:  No focal deficits appreciated. Other:     ED Results / Procedures / Treatments   Labs (all labs ordered are listed, but only abnormal results are displayed) Labs Reviewed  COMPREHENSIVE METABOLIC PANEL WITH GFR - Abnormal; Notable for the following components:      Result Value   CO2 19 (*)    BUN 71 (*)    Creatinine, Ser 6.72 (*)    Calcium  7.9 (*)    Total Protein 4.9 (*)     Albumin  2.3 (*)    AST 301 (*)    ALT 90 (*)    Alkaline Phosphatase 226 (*)    Total Bilirubin 3.1 (*)    GFR, Estimated 8 (*)    Anion gap 17 (*)    All other components within normal limits  CBC WITH DIFFERENTIAL/PLATELET - Abnormal; Notable for the following components:   WBC 21.1 (*)    RBC 2.33 (*)    Hemoglobin 7.9 (*)    HCT 24.9 (*)    MCV 106.9 (*)    RDW 25.1 (*)    Platelets 117 (*)    nRBC 0.5 (*)    Neutro Abs 18.6 (*)    Monocytes Absolute 1.3 (*)    Abs Immature Granulocytes 0.17 (*)    All other components within normal limits  LACTIC ACID, PLASMA - Abnormal; Notable for the following components:   Lactic Acid, Venous 5.8 (*)  All other components within normal limits  TROPONIN I (HIGH SENSITIVITY) - Abnormal; Notable for the following components:   Troponin I (High Sensitivity) 68 (*)    All other components within normal limits  RESP PANEL BY RT-PCR (RSV, FLU A&B, COVID)  RVPGX2  CULTURE, BLOOD (ROUTINE X 2)  CULTURE, BLOOD (ROUTINE X 2)  MRSA NEXT GEN BY PCR, NASAL  GROUP A STREP BY PCR  RESPIRATORY PANEL BY PCR  MAGNESIUM   PROCALCITONIN  URINALYSIS, ROUTINE W REFLEX MICROSCOPIC  PROTIME-INR  LACTIC ACID, PLASMA  LEGIONELLA PNEUMOPHILA SEROGP 1 UR AG  STREP PNEUMONIAE URINARY ANTIGEN  TYPE AND SCREEN  TROPONIN I (HIGH SENSITIVITY)    EKG Low amplitude EKG demonstrates a sinus rhythm with rate of 61 bpm.  No STEMI  RADIOLOGY CXR interpreted by me without evidence of acute cardiopulmonary pathology.  Official radiology report(s): DG Chest Portable 1 View Result Date: 05/29/2024 CLINICAL DATA:  Weakness.  Hypotensive. EXAM: PORTABLE CHEST 1 VIEW COMPARISON:  05/15/2024. FINDINGS: The heart size and mediastinal contours are within normal limits. Status post placement of right IJ double-lumen hemodialysis catheter with tips overlying the lower SVC and superior cavoatrial junction. Aortic atherosclerosis. Small left pleural effusion, similar to  the prior exam. No overt pulmonary edema, focal consolidation, or pneumothorax. No acute osseous abnormality. IMPRESSION: 1. Small left pleural effusion, similar to the prior exam. 2. Interval placement of right IJ double-lumen hemodialysis catheter. Electronically Signed   By: Harrietta Sherry M.D.   On: 05/29/2024 13:50    PROCEDURES and INTERVENTIONS:  .1-3 Lead EKG Interpretation  Performed by: Claudene Rover, MD Authorized by: Claudene Rover, MD     Interpretation: normal     ECG rate:  62   ECG rate assessment: normal     Rhythm: sinus rhythm     Ectopy: none     Conduction: normal   .Critical Care  Performed by: Claudene Rover, MD Authorized by: Claudene Rover, MD   Critical care provider statement:    Critical care time (minutes):  30   Critical care time was exclusive of:  Separately billable procedures and treating other patients   Critical care was necessary to treat or prevent imminent or life-threatening deterioration of the following conditions:  Circulatory failure and sepsis   Critical care was time spent personally by me on the following activities:  Development of treatment plan with patient or surrogate, discussions with consultants, evaluation of patient's response to treatment, examination of patient, ordering and review of laboratory studies, ordering and review of radiographic studies, ordering and performing treatments and interventions, pulse oximetry, re-evaluation of patient's condition and review of old charts .Ultrasound ED Peripheral IV (Provider)  Date/Time: 05/29/2024 2:53 PM  Performed by: Claudene Rover, MD Authorized by: Claudene Rover, MD   Procedure details:    Indications: multiple failed IV attempts and poor IV access     Skin Prep: chlorhexidine  gluconate     Location: right cephalic v.   Angiocath:  18 G   Bedside Ultrasound Guided: Yes     Images: not archived     Patient tolerated procedure without complications: Yes     Dressing applied: Yes      Medications  vancomycin  (VANCOREADY) IVPB 2000 mg/400 mL (2,000 mg Intravenous New Bag/Given 05/29/24 1433)  norepinephrine  (LEVOPHED ) 4mg  in (0.016 mg/mL) premix infusion (20 mcg/min Intravenous Rate/Dose Change 05/29/24 1445)  Chlorhexidine  Gluconate Cloth 2 % PADS 6 each (has no administration in time range)  docusate sodium  (COLACE) capsule 100 mg (  has no administration in time range)  polyethylene glycol (MIRALAX  / GLYCOLAX ) packet 17 g (has no administration in time range)  lactated ringers  infusion (has no administration in time range)  lactated ringers  bolus 1,000 mL (0 mLs Intravenous Stopped 05/29/24 1432)  midodrine  (PROAMATINE ) tablet 10 mg (10 mg Oral Given 05/29/24 1323)  piperacillin -tazobactam (ZOSYN ) IVPB 3.375 g (0 g Intravenous Stopped 05/29/24 1432)     IMPRESSION / MDM / ASSESSMENT AND PLAN / ED COURSE  I reviewed the triage vital signs and the nursing notes.  Differential diagnosis includes, but is not limited to, medication noncompliance, sepsis, hypovolemia, viral syndrome  {Patient presents with symptoms of an acute illness or injury that is potentially life-threatening.  Patient recently started hemodialysis presents with generalized fatigue found to have evidence of septic shock requiring ICU admission.  Hypotensive despite 1.5 L of isotonic fluid and transition to peripheral vasopressors, Levophed  through 18-gauge ultrasound IV that I placed.  No clear source but overall workup concerning for sepsis and infection.  Leukocytosis, lactic acidosis, elevated procalcitonin.  Cultures are drawn he is provided broad-spectrum antibiotics.  Due to uncertain etiology noncontrast CT chest, abdomen and pelvis is ordered and pending at the time of admission to the ICU.  Clinical Course as of 05/29/24 1456  Wed May 29, 2024  1303 reassessed [DS]  1341 Reassessed. USIV right cephalic v. 18g [DS]  1355 Despite 1.5 L IV fluids, oral midodrine  remains hypotensive.  Will  transition to vasopressors, particularly with results we are getting back with leukocytosis and the possibility of sepsis.  Cultures have been drawn, we will provide broad-spectrum antibiotics. [DS]  1417 I consult ICU who agrees to evaluate for admission [DS]  1417 Patient looks much improved and reports feeling better on 12 mics per minute of Levophed  [DS]    Clinical Course User Index [DS] Claudene Rover, MD     FINAL CLINICAL IMPRESSION(S) / ED DIAGNOSES   Final diagnoses:  Septic shock (HCC)     Rx / DC Orders   ED Discharge Orders     None        Note:  This document was prepared using Dragon voice recognition software and may include unintentional dictation errors.   Claudene Rover, MD 05/29/24 [id number]

## 2024-05-29 NOTE — ED Notes (Signed)
 Called CCMD for central monitoring at this time

## 2024-05-29 NOTE — Procedures (Signed)
 Central Venous Catheter Insertion Procedure Note  ACEY WOODFIELD  [id number]  [birth year]  Date:05/29/24  Time:5:03 PM   Provider Performing:Brynna [place] Terrace   Procedure: Insertion of Non-tunneled Central Venous Catheter(36556) with US  guidance (23062)   Indication(s) Medication administration and Difficult access  Consent Risks of the procedure as well as the alternatives and risks of each were explained to the patient and/or caregiver.  Consent for the procedure was obtained and is signed in the bedside chart  Anesthesia Topical only with 1% lidocaine    Timeout Verified patient identification, verified procedure, site/side was marked, verified correct patient position, special equipment/implants available, medications/allergies/relevant history reviewed, required imaging and test results available.  Sterile Technique Maximal sterile technique including full sterile barrier drape, hand hygiene, sterile gown, sterile gloves, mask, hair covering, sterile ultrasound probe cover (if used).  Procedure Description Area of catheter insertion was cleaned with chlorhexidine  and draped in sterile fashion.  With real-time ultrasound guidance a central venous catheter was placed into the left internal jugular vein. Nonpulsatile blood flow and easy flushing noted in all ports.  The catheter was sutured in place and sterile dressing applied.  Complications/Tolerance None; patient tolerated the procedure well. Chest X-ray is ordered to verify placement for internal jugular or subclavian cannulation.   Chest x-ray is not ordered for femoral cannulation.  EBL Minimal  Specimen(s) None  [place], PA-C Pulmonary/Critical Care PCCM Team Contact Info: [phone]

## 2024-05-30 ENCOUNTER — Inpatient Hospital Stay

## 2024-05-30 LAB — BASIC METABOLIC PANEL WITH GFR
Anion gap: 13 (ref 5–15)
BUN: 74 mg/dL — ABNORMAL HIGH (ref 8–23)
CO2: 25 mmol/L (ref 22–32)
Calcium: 7.9 mg/dL — ABNORMAL LOW (ref 8.9–10.3)
Chloride: 100 mmol/L (ref 98–111)
Creatinine, Ser: 6.89 mg/dL — ABNORMAL HIGH (ref 0.61–1.24)
GFR, Estimated: 8 mL/min — ABNORMAL LOW (ref >60)
Glucose, Bld: 147 mg/dL — ABNORMAL HIGH (ref 70–99)
Potassium: 4 mmol/L (ref 3.5–5.1)
Sodium: 138 mmol/L (ref 135–145)

## 2024-05-30 LAB — HEPATIC FUNCTION PANEL
ALT: 91 U/L — ABNORMAL HIGH (ref 0–44)
AST: 270 U/L — ABNORMAL HIGH (ref 15–41)
Albumin: 2 g/dL — ABNORMAL LOW (ref 3.5–5.0)
Alkaline Phosphatase: 205 U/L — ABNORMAL HIGH (ref 38–126)
Bilirubin, Direct: 2.1 mg/dL — ABNORMAL HIGH (ref 0.0–0.2)
Indirect Bilirubin: 1.5 mg/dL — ABNORMAL HIGH (ref 0.3–0.9)
Total Bilirubin: 3.6 mg/dL — ABNORMAL HIGH (ref 0.0–1.2)
Total Protein: 4.4 g/dL — ABNORMAL LOW (ref 6.5–8.1)

## 2024-05-30 LAB — CBC
HCT: 20.5 % — ABNORMAL LOW (ref 39.0–52.0)
Hemoglobin: 6.6 g/dL — ABNORMAL LOW (ref 13.0–17.0)
MCH: 33.8 pg (ref 26.0–34.0)
MCHC: 32.2 g/dL (ref 30.0–36.0)
MCV: 105.1 fL — ABNORMAL HIGH (ref 80.0–100.0)
Platelets: 163 K/uL (ref 150–400)
RBC: 1.95 MIL/uL — ABNORMAL LOW (ref 4.22–5.81)
RDW: 25.3 % — ABNORMAL HIGH (ref 11.5–15.5)
WBC: 29.9 K/uL — ABNORMAL HIGH (ref 4.0–10.5)
nRBC: 1.1 % — ABNORMAL HIGH (ref 0.0–0.2)

## 2024-05-30 LAB — HEMOGLOBIN AND HEMATOCRIT, BLOOD
HCT: 24.6 % — ABNORMAL LOW (ref 39.0–52.0)
HCT: 24 % — ABNORMAL LOW (ref 39.0–52.0)
Hemoglobin: 8.1 g/dL — ABNORMAL LOW (ref 13.0–17.0)
Hemoglobin: 8 g/dL — ABNORMAL LOW (ref 13.0–17.0)

## 2024-05-30 LAB — TROPONIN I (HIGH SENSITIVITY)
Troponin I (High Sensitivity): 101 ng/L (ref <18)
Troponin I (High Sensitivity): 101 ng/L (ref <18)
Troponin I (High Sensitivity): 95 ng/L — ABNORMAL HIGH (ref <18)

## 2024-05-30 LAB — PROTIME-INR
INR: 7.9 (ref 0.8–1.2)
Prothrombin Time: 69.1 s — ABNORMAL HIGH (ref 11.4–15.2)

## 2024-05-30 LAB — LACTIC ACID, PLASMA
Lactic Acid, Venous: 2.9 mmol/L (ref 0.5–1.9)
Lactic Acid, Venous: 3.2 mmol/L (ref 0.5–1.9)

## 2024-05-30 LAB — MAGNESIUM: Magnesium: 1.7 mg/dL (ref 1.7–2.4)

## 2024-05-30 LAB — PREPARE RBC (CROSSMATCH)

## 2024-05-30 LAB — COOXEMETRY PANEL
Carboxyhemoglobin: 2.9 % — ABNORMAL HIGH (ref 0.5–1.5)
Methemoglobin: 0.8 % (ref 0.0–1.5)
O2 Saturation: 88 %
Total hemoglobin: 7.4 g/dL — ABNORMAL LOW (ref 12.0–16.0)
Total oxygen content: 84.7 %

## 2024-05-30 LAB — PHOSPHORUS: Phosphorus: 4.9 mg/dL — ABNORMAL HIGH (ref 2.5–4.6)

## 2024-05-30 MED ORDER — VITAMIN K1 10 MG/ML IJ SOLN
10.0000 mg | Freq: Once | INTRAVENOUS | Status: AC
  Administered 2024-05-30: 10 mg via INTRAVENOUS
  Filled 2024-05-30: qty 1

## 2024-05-30 MED ORDER — HEPARIN SODIUM (PORCINE) 1000 UNIT/ML DIALYSIS: 1000.0000 [IU] | INTRAMUSCULAR | Status: DC | PRN

## 2024-05-30 MED ORDER — SODIUM CHLORIDE 0.9% IV SOLUTION: Freq: Once | INTRAVENOUS | Status: AC

## 2024-05-30 MED ORDER — ALTEPLASE 2 MG IJ SOLR: 2.0000 mg | Freq: Once | INTRAMUSCULAR | Status: DC | PRN

## 2024-05-30 MED ORDER — EPOETIN ALFA-EPBX 10000 UNIT/ML IJ SOLN
10000.0000 [IU] | INTRAMUSCULAR | Status: DC
  Administered 2024-05-30: 10000 [IU] via INTRAVENOUS
  Filled 2024-05-30: qty 2

## 2024-05-30 MED ORDER — MAGNESIUM SULFATE 2 GM/50ML IV SOLN
2.0000 g | Freq: Once | INTRAVENOUS | Status: AC
  Administered 2024-05-30: 2 g via INTRAVENOUS
  Filled 2024-05-30: qty 50

## 2024-05-30 MED ORDER — PANTOPRAZOLE SODIUM 40 MG IV SOLR
40.0000 mg | Freq: Two times a day (BID) | INTRAVENOUS | Status: DC
Start: 2024-05-30 — End: 2024-06-12
  Administered 2024-05-30 – 2024-06-12 (×27): 40 mg via INTRAVENOUS
  Filled 2024-05-30 (×28): qty 10

## 2024-05-30 MED ORDER — PANTOPRAZOLE SODIUM 40 MG IV SOLR
80.0000 mg | Freq: Once | INTRAVENOUS | Status: AC
  Administered 2024-05-30: 80 mg via INTRAVENOUS
  Filled 2024-05-30: qty 20

## 2024-05-30 NOTE — Consult Note (Signed)
 Ruel Kung , MD [address], [address], [place], [place], [address] Phone: [phone] Fax: [phone]  Consultation  Referring Provider:    ICU Primary Care Physician:  Autry Grayce LABOR, PA Primary Gastroenterologist:  Dr. Maryruth Reason for Consultation: GI bleed  Date of Admission:  05/29/2024 Date of Consultation:  05/30/2024         HPI:   Kevin Gross is a 68 y.o. male who follows with Dr. Maryruth at the office last seen on 04/04/2024 for iron  deficiency anemia baseline hemoglobin usually around 8 to 9 g and seen at the office was 5.9 g.  In January 2025 underwent colonoscopyhe underwent a colonoscopy that showed diverticulosis of the colon as well as internal hemorrhoids.  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  Plan at the office was repeat EGD and colonoscopy and if negative will consider small bowel enteroscopy versus repeating capsule study.   He presented to the emergency room yesterday from his home with hypotension and weakness he undergoes dialysis in [place].  He had been having some URI symptoms with minor congestion and sore throat in the ER was found to be hypotensive hemoglobin was 7.9 g creatinine was 6.72 chest x-ray showed small left pleural effusion CT chest abdomen pelvis was also performed that showed small left greater than right pleural effusion, liver cirrhosis, moderate ascites, distended gallbladder exophytic lesions in the left kidney further MRI should be considered was recommended.  05/30/2024: AST 270 ALT 91 alkaline phosphatase 205 total bilirubin 3.62 weeks back transaminases were much lower. INR 7.9, hemoglobin 6.6 g with an MCV of 105 and a white cell count  of 29  He is on Eliquis  for portal vein thrombosis, had 2 episodes of melena last night none since. He says he has been having nasal bleeds yesterday . Denies any nsaid use, no hematemesis , no nsaid use. No abdominal pain     Past Medical History:  Diagnosis Date   Alcoholic cirrhosis of liver (HCC)    Anemia 2024   Cataract 2024   CHF (congestive heart failure) (HCC)    Chronic gouty arthritis    CKD (chronic kidney disease), stage III (HCC)    Clotting disorder    Bilateral legs-pt states plaque build up in both legs requiring intervention   Complication of anesthesia    pt states that years ago during colonoscopy at [facility] they had to abort the procedure due to some cardiac issue had to see cardiologist   COPD (chronic obstructive pulmonary disease) (HCC)    Hyperlipidemia    Hypertension    Hyperuricemia    Peripheral arterial disease    Pre-diabetes    Prostate cancer (HCC) 2023   Sleep apnea    uses cpap    Past Surgical History:  Procedure Laterality Date   ABDOMINAL AORTOGRAM W/LOWER EXTREMITY N/A 10/20/2021   Procedure: ABDOMINAL AORTOGRAM W/LOWER EXTREMITY;  Surgeon: Darron Deatrice LABOR, MD;  [facility];  Service: Cardiovascular;  Laterality: N/A;   ABDOMINAL AORTOGRAM W/LOWER EXTREMITY N/A 08/08/2023   Procedure: ABDOMINAL AORTOGRAM W/LOWER EXTREMITY;  Surgeon: Serene Gaile ORN, MD;  [facility]: [facility];  Service: Cardiovascular;  Laterality: N/A;  ALLOGRAFT APPLICATION Bilateral 12/22/2021   Procedure: ALLOGRAFT APPLICATION;  Surgeon: Serene Gaile ORN, MD;  Location: [facility] OR;  Service: Vascular;  Laterality: Bilateral;   ANGIOPLASTY Left 11/26/2021   Procedure: LEFT INFRARENAL LITHOTRIPSY;  Surgeon: Serene Gaile ORN, MD;  Location[facility];  Service: Vascular;  Laterality: Left;   APPLICATION OF WOUND VAC Bilateral 12/13/2021   Procedure: APPLICATION OF WOUND VAC;  Surgeon: Lanis Fonda BRAVO, MD;  Location: [facility] OR;  Service: Vascular;   Laterality: Bilateral;   CARDIAC CATHETERIZATION     [facility]   CATARACT EXTRACTION W/PHACO Left 04/04/2023   Procedure: CATARACT EXTRACTION PHACO AND INTRAOCULAR LENS PLACEMENT (IOC) LEFT 8.71 00:45.5;  Surgeon: Jaye Fallow, MD;  Location: [facility];  Service: Ophthalmology;  Laterality: Left;   CATARACT EXTRACTION W/PHACO Right 04/18/2023   Procedure: CATARACT EXTRACTION PHACO AND INTRAOCULAR LENS PLACEMENT (IOC) RIGHT 4.31 00:8.2;  Surgeon: Jaye Fallow, MD;  Location: [facility];  Service: Ophthalmology;  Laterality: Right;   COLONOSCOPY     COLONOSCOPY WITH PROPOFOL  N/A 01/17/2018   Procedure: COLONOSCOPY WITH PROPOFOL ;  Surgeon: Toledo, Ladell POUR, MD;  Location: [facility];  Service: Gastroenterology;  Laterality: N/A;   COLONOSCOPY WITH PROPOFOL  N/A 09/12/2022   Procedure: COLONOSCOPY WITH PROPOFOL ;  Surgeon: Maryruth Ole DASEN, MD;  Location: [facility];  Service: Endoscopy;  Laterality: N/A;   COLONOSCOPY WITH PROPOFOL  N/A 09/05/2023   Procedure: COLONOSCOPY WITH PROPOFOL ;  Surgeon: Maryruth Ole DASEN, MD;  Location: [facility];  Service: Endoscopy;  Laterality: N/A;   DIALYSIS/PERMA CATHETER INSERTION Right 05/21/2024   Procedure: DIALYSIS/PERMA CATHETER INSERTION;  Surgeon: Jama Cordella MATSU, MD;  Location: [facility];  Service: Cardiovascular;  Laterality: Right;   ENDARTERECTOMY FEMORAL Bilateral 11/26/2021   Procedure: BILATERAL FEMORAL ENDARTERECTOMY WITH VEIN PATCH ANGIOPLASTY;  Surgeon: Serene Gaile ORN, MD;  [facility];  Service: Vascular;  Laterality: Bilateral;   ENTEROSCOPY N/A 09/05/2023   Procedure: ENTEROSCOPY;  Surgeon: Maryruth Ole DASEN, MD;  Location: [facility];  Service: Endoscopy;  Laterality: N/A;   ESOPHAGOGASTRODUODENOSCOPY (EGD) WITH PROPOFOL  N/A 01/17/2018   Procedure: ESOPHAGOGASTRODUODENOSCOPY (EGD) WITH PROPOFOL ;  Surgeon: Toledo, Ladell POUR, MD;  Location: [facility];  Service: Gastroenterology;   Laterality: N/A;   ESOPHAGOGASTRODUODENOSCOPY (EGD) WITH PROPOFOL  N/A 09/12/2022   Procedure: ESOPHAGOGASTRODUODENOSCOPY (EGD) WITH PROPOFOL ;  Surgeon: Maryruth Ole DASEN, MD;  Location: [facility];  Service: Endoscopy;  Laterality: N/A;   EYE SURGERY     GROIN DEBRIDEMENT Bilateral 12/16/2021   Procedure: IRRIGATION AND DEBRIDEMENT OF BILATERAL GROINS;  Surgeon: Serene Gaile ORN, MD;  [facility];  Service: Vascular;  Laterality: Bilateral;   INCISION AND DRAINAGE OF WOUND Bilateral 12/13/2021   Procedure: IRRIGATION AND DEBRIDEMENT OF BILATERAL GROIN WOUNDS;  Surgeon: Lanis Fonda BRAVO, MD;  Location: [facility] OR;  Service: Vascular;  Laterality: Bilateral;   INSERTION OF ILIAC STENT Bilateral 11/26/2021   Procedure: BILATERAL ILIAC STENTING USING 68mmX80mm INNOVA STENT ON LEFT ILIAC AND 45mmX59mm, 63mmX79mm VBX AND 77mmX7.5cm VIABAHN STENT ON RIGHT ILIAC;  Surgeon: Serene Gaile ORN, MD;  Location: [facility];  Service: Vascular;  Laterality: Bilateral;   INSERTION OF SEEDS IN PROSTATE     ORIF ANKLE FRACTURE Right    PERIPHERAL INTRAVASCULAR LITHOTRIPSY  08/08/2023   Procedure: PERIPHERAL INTRAVASCULAR LITHOTRIPSY;  Surgeon: Serene Gaile ORN, MD;  [facility];  Service: Cardiovascular;;   PROSTATE BIOPSY N/A 04/01/2021   Procedure: PROSTATE BIOPSY GRAYCE;  Surgeon: Kassie Ozell SAUNDERS, MD;  Location: [facility];  Service: Urology;  Laterality: N/A;   VEIN HARVEST Bilateral 11/26/2021   Procedure: VEIN HARVEST OF BILATERAL SPAHENOUS VEINS;  Surgeon: Serene Gaile ORN, MD;  Location[facility];  Service: Vascular;  Laterality: Bilateral;   WOUND DEBRIDEMENT Bilateral 12/22/2021   Procedure: INCISION AND DEBRIDEMENT OF BILATERAL GROINS;  Surgeon: Serene Gaile ORN, MD;  Location: [facility];  Service: Vascular;  Laterality: Bilateral;    Prior to Admission medications   Medication Sig Start Date End Date Taking? Authorizing Provider  allopurinol  (ZYLOPRIM ) 100 MG tablet Take 150 mg by mouth daily.    Yes [provider]  amiodarone  (PACERONE ) 200 MG tablet Take 200 mg by mouth 2 (two) times daily.   Yes [provider]  APIXABAN  (ELIQUIS ) VTE STARTER PACK (10MG  AND 5MG ) Take as directed on package: start with two-5mg  tablets twice daily for 7 days. On day 8, switch to one-5mg  tablet twice daily. 05/24/24  Yes Jens Durand, MD  aspirin  EC 81 MG tablet Take 81 mg by mouth every morning.   Yes [provider]  benzonatate (TESSALON) 100 MG capsule Take 200 mg by mouth 3 (three) times daily as needed. 05/17/24  Yes [provider]  Cholecalciferol  (VITAMIN D3) 25 MCG (1000 UT) CAPS Take 2,000 Units by mouth daily.   Yes [provider]  ezetimibe  (ZETIA ) 10 MG tablet Take 1 tablet (10 mg total) by mouth daily. 04/10/18  Yes Gollan, Timothy J, MD  iron  polysaccharides (NIFEREX) 150 MG capsule Take 1 capsule (150 mg total) by mouth 2 (two) times daily. 03/11/24 06/09/24 Yes Sreenath, Calvin NOVAK, MD  midodrine  (PROAMATINE ) 10 MG tablet Take 1 tablet (10 mg total) by mouth 3 (three) times daily with meals. 05/24/24  Yes Jens Durand, MD  pantoprazole  (PROTONIX ) 40 MG tablet Take 40 mg by mouth daily.   Yes [provider]  simvastatin  (ZOCOR ) 20 MG tablet Take 20 mg by mouth in the morning.   Yes [provider]  triamcinolone  ointment (KENALOG ) 0.1 % Apply 1 Application topically 2 (two) times daily as needed (psoriasis). 07/18/23  Yes [provider]  zolpidem  (AMBIEN ) 10 MG tablet Take 10 mg by mouth at bedtime.   Yes [provider]    Family History  Problem Relation Age of Onset   Heart disease Mother    Heart disease Brother      Social History   Tobacco Use   Smoking status: Former    Current packs/day: 0.00    Average packs/day: 1 pack/day for 47.0 years (47.0 ttl pk-yrs)    Types: Cigarettes    Start date: 22    Quit date: 2016    Years since quitting: 9.7    Passive exposure: Never   Smokeless  tobacco: Never  Vaping Use   Vaping status: Never Used  Substance Use Topics   Alcohol use: Not Currently   Drug use: No    Allergies as of 05/29/2024 - Review Complete 05/29/2024  Allergen Reaction Noted   Jardiance [empagliflozin]  08/02/2023    Review of Systems:    All systems reviewed and negative except where noted in HPI.   Physical Exam:  Vital signs in last 24 hours: Temp:  [97.5 F (36.4 C)-98.8 F (37.1 C)] 98.8 F (37.1 C) (10/02 0700) Pulse Rate:  [55-84] 58 (10/02 0745) Resp:  [1-26] 13 (10/02 0745) BP: (68-131)/(24-107) 124/80 (10/02 0745) SpO2:  [78 %-100 %] 100 % (10/02 0745) Weight:  [70.5 kg-116.7 kg] 116.7 kg (10/02 0500) Last BM Date :  05/30/24 General:   Pleasant, cooperative in NAD Head:  Normocephalic and atraumatic. Eyes:   No icterus.   Conjunctiva pink. PERRLA. Ears:  Normal auditory acuity. Neck:  Supple; no masses or thyroidomegaly Lungs: Respirations even and unlabored. Lungs clear to auscultation bilaterally.   No wheezes, crackles, or rhonchi.  Heart:  Regular rate and rhythm;  Without murmur, clicks, rubs or gallops Abdomen:  Soft, nondistended, nontender. Normal bowel sounds. No appreciable masses or hepatomegaly.  No rebound or guarding.  Neurologic:  Alert and oriented x3;  grossly normal neurologically. Psych:  Alert and cooperative. Normal affect.  LAB RESULTS: Recent Labs    05/29/24 1256 05/29/24 2040 05/30/24 0338  WBC 21.1* 32.3* 29.9*  HGB 7.9* 7.7* 6.6*  HCT 24.9* 24.4* 20.5*  PLT 117* 180 163   BMET Recent Labs    05/29/24 1256 05/30/24 0338  NA 137 138  K 3.5 4.0  CL 101 100  CO2 19* 25  GLUCOSE 82 147*  BUN 71* 74*  CREATININE 6.72* 6.89*  CALCIUM  7.9* 7.9*   LFT Recent Labs    05/30/24 0338  PROT 4.4*  ALBUMIN  2.0*  AST 270*  ALT 91*  ALKPHOS 205*  BILITOT 3.6*  BILIDIR 2.1*  IBILI 1.5*   PT/INR Recent Labs    05/29/24 1256 05/30/24 0338  LABPROT 59.0* 69.1*  INR 6.4* 7.9*     STUDIES: CT CHEST ABDOMEN PELVIS WO CONTRAST Result Date: 05/29/2024 CLINICAL DATA:  Septic shock without clear source new end-stage renal disease EXAM: CT CHEST, ABDOMEN AND PELVIS WITHOUT CONTRAST TECHNIQUE: Multidetector CT imaging of the chest, abdomen and pelvis was performed following the standard protocol without IV contrast. RADIATION DOSE REDUCTION: This exam was performed according to the departmental dose-optimization program which includes automated exposure control, adjustment of the mA and/or kV according to patient size and/or use of iterative reconstruction technique. COMPARISON:  Ultrasound 05/17/2024, CT 05/15/2024, chest x-ray 05/29/2024, chest CT 11/08/2023 FINDINGS: CT CHEST FINDINGS Cardiovascular: Limited without intravenous contrast. Moderate aortic atherosclerosis. 4.1 cm diameter ascending aorta. Multi-vessel coronary vascular calcification. No sizable pericardial effusion. Left-sided IJ central venous catheter with tip cough in the SVC. Right sided dialysis catheter with tips at the SVC and cavoatrial junction. Mediastinum/Nodes: Patent trachea. No thyroid mass. No suspicious lymph nodes. Esophagus within normal limits Lungs/Pleura: Mild emphysema. Small left greater than right pleural effusions. Dependent atelectasis in the left lower lobe. Patchy foci of ground-glass disease in the upper lobes and right middle lobe. No pneumothorax. Musculoskeletal: Sternum appears intact. No acute osseous abnormality. CT ABDOMEN PELVIS FINDINGS Hepatobiliary: Liver cirrhosis. Distended gallbladder with small stones. No biliary dilatation Pancreas: Unremarkable. No pancreatic ductal dilatation or surrounding inflammatory changes. Spleen: Normal in size without focal abnormality. Adrenals/Urinary Tract: Adrenal glands are normal. No hydronephrosis. Several slightly dense exophytic lesions left kidney, largest off the superior pole and measuring 2.6 cm. The bladder is slightly thick walled but  decompressed Stomach/Bowel: The stomach is within normal limits. Few mildly dilated loops of jejunum in the left upper quadrant but without obstruction. No definitive acute bowel wall thickening. Scattered diverticular disease of the colon Vascular/Lymphatic: Advanced aortic atherosclerosis. No definitive aneurysm. No suspicious lymph nodes. Postsurgical changes at the left groin. Reproductive: Prostate seeds Other: Negative for free air. Moderate ascites in the abdomen and pelvis but overall decreased compared to the CT from September. Musculoskeletal: No acute or suspicious osseous abnormality. IMPRESSION: 1. Small left greater than right pleural effusions with dependent atelectasis in the left lower lobe. Patchy foci  of ground-glass disease in the upper lobes and right middle lobe, suspect infectious or inflammatory. 2. Liver cirrhosis. Moderate ascites in the abdomen and pelvis but overall decreased compared to the CT from mid September. 3. Distended gallbladder with small stones. 4. Several slightly dense exophytic lesions left kidney, largest off the superior pole and measuring 2.6 cm. These are indeterminate in the absence of contrast. When the patient is clinically stable and able to follow directions and hold their breath (preferably as an outpatient) further evaluation with dedicated abdominal MRI should be considered. 5. Aortic atherosclerosis. Maximum ascending aortic diameter of 4.1 cm. Recommend annual imaging followup by CTA or MRA. This recommendation follows 2010 ACCF/AHA/AATS/ACR/ASA/SCA/SCAI/SIR/STS/SVM Guidelines for the Diagnosis and Management of Patients with Thoracic Aortic Disease. Circulation. 2010; 121: Z733-z630. Aortic aneurysm NOS (ICD10-I71.9) Aortic Atherosclerosis (ICD10-I70.0). Electronically Signed   By: Luke Bun M.D.   On: 05/29/2024 18:26   DG Chest Port 1 View Result Date: 05/29/2024 CLINICAL DATA:  Central line EXAM: PORTABLE CHEST 1 VIEW COMPARISON:  05/29/2024  FINDINGS: Right-sided central venous catheter tips at the right atrial level. Left IJ central venous catheter tip at the proximal right atrium. Cardiomegaly with vascular congestion. Possible small left effusion. Increasing airspace disease left base. Aortic atherosclerosis. No definitive pneumothorax. IMPRESSION: 1. Right-sided central venous catheter tips at the atrial level. Left IJ central venous catheter tip at the proximal right atrium. No definitive pneumothorax. 2. Cardiomegaly with vascular congestion. Increasing airspace disease at the left base which may be due to atelectasis or pneumonia. Small left effusion Electronically Signed   By: Luke Bun M.D.   On: 05/29/2024 17:35   DG Chest Portable 1 View Result Date: 05/29/2024 CLINICAL DATA:  Weakness.  Hypotensive. EXAM: PORTABLE CHEST 1 VIEW COMPARISON:  05/15/2024. FINDINGS: The heart size and mediastinal contours are within normal limits. Status post placement of right IJ double-lumen hemodialysis catheter with tips overlying the lower SVC and superior cavoatrial junction. Aortic atherosclerosis. Small left pleural effusion, similar to the prior exam. No overt pulmonary edema, focal consolidation, or pneumothorax. No acute osseous abnormality. IMPRESSION: 1. Small left pleural effusion, similar to the prior exam. 2. Interval placement of right IJ double-lumen hemodialysis catheter. Electronically Signed   By: Harrietta Sherry M.D.   On: 05/29/2024 13:50      Impression / Plan:   Kevin Gross is a 68 y.o. y/o male with history of hemodialysis iron  deficiency anemia who follows with Dr. Maryruth in the outpatient had an EGD and colonoscopy in January 2025 which showed diverticulosis in the colon internal hemorrhoids nonbleeding small duodenal ulcer AVMs in the duodenum as well as stomach which was ablated with APC admitted with hypotension upper respiratory tract symptoms on Eliquis  for portal vein thrombosis history of liver cirrhosis.  I  have been consulted for drop in hemoglobin and melena in the setting of very significantly elevated INR of 7.9.  Elevated liver function test.  Drop in hemoglobin from baseline from 7.9-6.6. Coukld likely be related to his nose bleeds which he says was going on yesterday that has stopped .    Impression/Plan. 1.  Drop in hemoglobin and melena likely a GI bleed secondary to supratherapeutic INR.  He does have a longstanding history of iron  deficiency anemia which is being evaluated by Dr. Tasia as an outpatient and recommended repeat EGD and colonoscopy at the last visit in 04/17/2024.  Once the INR returns to less than 1.5 can consider EGD/colonoscopy depending on respiratory status.  I also  note that the white cell count was significantly elevated at 29.9 which needs to be evaluated. He dioes have a history of nose bleeds and says he did have some yesterday - consider evaluation if felt necessary .   2.  Elevated white cell count consider ruling out SBP as ascites seen on imaging if not already in plan for work up of sepsis.when INR down or empirically treat  .  3.  Monitor CBC transfuse as needed consider repeating portal vein Doppler as the recent Doppler showed nonocclusive thrombus in the main portal vein likely extending into the intrahepatic right portal vein may be becoming on the verge of getting completely occlusive and if it is progressing consider vascular intervention if it is a possibility.  Discussed plan with Dr Isaiah  Thank you for involving me in the care of this patient.      LOS: 1 day   Ruel Kung, MD  05/30/2024, 8:09 AM

## 2024-05-30 NOTE — Progress Notes (Addendum)
 Pt had two episodes of black tarry stools. NP notified and protonix  bolus ordered.  Labs drawn early to monitor Hgb levels, which showed a decrease. NP notified and 1 unit of blood ordered.  INR levels also have increased to which Vit K was ordered. Pt mentation remains the same, vital signs are stable with pressors.

## 2024-05-30 NOTE — Progress Notes (Addendum)
 PHARMACY CONSULT NOTE - ELECTROLYTES  Pharmacy Consult for Electrolyte Monitoring and Replacement   Recent Labs: Height: 5' 9 (175.3 cm) Weight: 116.7 kg (257 lb 4.4 oz) IBW/kg (Calculated) : 70.7 Estimated Creatinine Clearance: 13.3 mL/min (A) (by C-G formula based on SCr of 6.89 mg/dL (H)). Potassium (mmol/L)  Date Value  05/30/2024 4.0   Magnesium  (mg/dL)  Date Value  89/97/7974 1.7   Calcium  (mg/dL)  Date Value  89/97/7974 7.9 (L)   Albumin  (g/dL)  Date Value  89/97/7974 2.0 (L)   Phosphorus (mg/dL)  Date Value  89/97/7974 4.9 (H)   Sodium (mmol/L)  Date Value  05/30/2024 138  10/12/2021 145 (H)    Assessment  Kevin Gross is a 68 y.o. male presenting with septic shock. PMH significant for Portal vein thrombosis 9/17, CHF, ESRD on HD TTS, ETOH disorder, chronic bradycardia. Pharmacy has been consulted to monitor and replace electrolytes.  Diet: NPO MIVF: LR @ 150 mL/hr Pertinent medications:   Goal of Therapy: Electrolytes WNL  Plan:  Mg sulfate 2 g IV x 1  Check BMP, Mg, Phos with AM labs  Thank you for allowing pharmacy to be a part of this patient's care.   Ransom Blanch PGY-1 Pharmacy Resident  [place] [facility]  05/30/2024 7:06 AM

## 2024-05-30 NOTE — Progress Notes (Addendum)
  Received patient in bed to unit.   Informed consent signed and in chart.    TX duration:3:30     Transported by  Hand-off given to patient's nurse.    Access used: Right internal jugular Cath  Access issues: none   Total UF removed: 0 Medication(s) given: Epo 10,000 u Post HD VS: wnl Post HD weight: 120.3 kg Pt was on the tx for 20 mins and c/o sob o2 97% at the time. and difficult breathing. Notified pt nurse Mayra and notified NP Breeze and Dr. Marcelino. Pt began to have low bp 85/38, levo was increased from 16 to 24 bp was checked again 78/43 NP Breeze sent message to decreased bfr. BFR was decreased to 300.    nurse increased Levo to 40 max and bp increased to 97/45. After 20 mins pt's sob went away and pressure was stable. GEANNIE Sar LPN [facility]

## 2024-05-30 NOTE — Progress Notes (Signed)
 [facility]  ROUNDING NOTE   Subjective:   Kevin Gross  is a 68 y.o.  male  with past medical conditions including hypertension, COPD, obstructive sleep apnea with CPAP, anemia, and recent acute kidney injury requiring hemodialysis. Patient presents to ED with decreased blood pressure and has been admitted to ICU for Septic shock (HCC) [A41.9, R65.21]  Patient seen and evaluated at bedside. Alert and oriented. States he went to dialysis on Tuesday, no complications. States he began feeling week on Wednesday. Does feel better today. 2 pressors present, Vaso and Levo. Denies pain or discomfort. NPO  Labs on ED arrival concerning from 0 bicarb 19, BUN 71, creatinine 6.72 with GFR 8, calcium  7.9, albumin  2.3, troponin 68, lactic acid 5.8, white count 21.1 with hemoglobin 7.9.  INR 6.4.  Respiratory panel negative for influenza, COVID-19, and.  Expanded panel also negative.  Chest x-ray shows small left pleural effusion, unchanged from previous and.  CT chest abdomen pelvis confirmed small left greater than right pleural effusions, left cirrhosis and ascites, distended gallbladder with small stones dense lesions on left kidney.  We have been consulted to manage dialysis needs during this admission   Objective:  Vital signs in last 24 hours:  Temp:  [97.5 F (36.4 C)-98.8 F (37.1 C)] 98.4 F (36.9 C) (10/02 1024) Pulse Rate:  [55-84] 67 (10/02 1145) Resp:  [1-26] 15 (10/02 1145) BP: (68-131)/(24-107) 103/49 (10/02 1145) SpO2:  [78 %-100 %] 97 % (10/02 1145) FiO2 (%):  [21 %] 21 % (10/02 1051) Weight:  [70.5 kg-116.7 kg] 116.7 kg (10/02 0500)  Weight change:  Filed Weights   05/29/24 1259 05/29/24 1530 05/30/24 0500  Weight: 113.2 kg 70.5 kg 116.7 kg    Intake/Output: I/O last 3 completed shifts: In: 4610.8 [I.V.:2245.7; Blood:1260; IV Piggyback:1105] Out: 0    Intake/Output this shift:  Total I/O In: 333 [Blood:333] Out: -   Physical Exam: General: NAD  Head:  Normocephalic, atraumatic. Moist oral mucosal membranes  Eyes: Anicteric  Lungs:  Clear to auscultation, normal effort  Heart: Regular rate and rhythm  Abdomen:  Soft, nontender  Extremities: Trace peripheral edema.  Neurologic: Awake, alert, conversant  Skin: Warm,dry, no rash  Access: Right IJ PermCath    Basic Metabolic Panel: Recent Labs  Lab 05/24/24 1404 05/29/24 1256 05/30/24 0338  NA 135 137 138  K 3.5 3.5 4.0  CL 98 101 100  CO2 24 19* 25  GLUCOSE 121* 82 147*  BUN 49* 71* 74*  CREATININE 5.39* 6.72* 6.89*  CALCIUM  8.0* 7.9* 7.9*  MG  --  2.1 1.7  PHOS 5.3*  --  4.9*    Liver Function Tests: Recent Labs  Lab 05/24/24 1404 05/29/24 1256 05/30/24 0338  AST  --  301* 270*  ALT  --  90* 91*  ALKPHOS  --  226* 205*  BILITOT  --  3.1* 3.6*  PROT  --  4.9* 4.4*  ALBUMIN  2.7* 2.3* 2.0*   No results for input(s): LIPASE, AMYLASE in the last 168 hours. No results for input(s): AMMONIA in the last 168 hours.   CBC: Recent Labs  Lab 05/24/24 0435 05/29/24 1256 05/29/24 2040 05/30/24 0338  WBC 11.4* 21.1* 32.3* 29.9*  NEUTROABS  --  18.6* 27.5*  --   HGB 10.0* 7.9* 7.7* 6.6*  HCT 31.1* 24.9* 24.4* 20.5*  MCV 101.3* 106.9* 107.0* 105.1*  PLT 116* 117* 180 163    Cardiac Enzymes: No results for input(s): CKTOTAL, CKMB, CKMBINDEX, TROPONINI  in the last 168 hours.  BNP: Invalid input(s): POCBNP  CBG: Recent Labs  Lab 05/29/24 1540  GLUCAP 113*     Microbiology: Results for orders placed or performed during the hospital encounter of 05/29/24  Resp panel by RT-PCR (RSV, Flu A&B, Covid) Anterior Nasal Swab     Status: None   Collection Time: 05/29/24 12:56 PM   Specimen: Anterior Nasal Swab  Result Value Ref Range Status   SARS Coronavirus 2 by RT PCR NEGATIVE NEGATIVE Final    Comment: (NOTE) SARS-CoV-2 target nucleic acids are NOT DETECTED.  The SARS-CoV-2 RNA is generally detectable in upper respiratory specimens during the  acute phase of infection. The lowest concentration of SARS-CoV-2 viral copies this assay can detect is 138 copies/mL. A negative result does not preclude SARS-Cov-2 infection and should not be used as the sole basis for treatment or other patient management decisions. A negative result may occur with  improper specimen collection/handling, submission of specimen other than nasopharyngeal swab, presence of viral mutation(s) within the areas targeted by this assay, and inadequate number of viral copies(<138 copies/mL). A negative result must be combined with clinical observations, patient history, and epidemiological information. The expected result is Negative.  Fact Sheet for Patients:  BloggerCourse.com  Fact Sheet for Healthcare Providers:  SeriousBroker.it  This test is no t yet approved or cleared by the [place]  FDA and  has been authorized for detection and/or diagnosis of SARS-CoV-2 by FDA under an Emergency Use Authorization (EUA). This EUA will remain  in effect (meaning this test can be used) for the duration of the COVID-19 declaration under Section 564(b)(1) of the Act, 21 U.S.C.section 360bbb-3(b)(1), unless the authorization is terminated  or revoked sooner.       Influenza A by PCR NEGATIVE NEGATIVE Final   Influenza B by PCR NEGATIVE NEGATIVE Final    Comment: (NOTE) The Xpert Xpress SARS-CoV-2/FLU/RSV plus assay is intended as an aid in the diagnosis of influenza from Nasopharyngeal swab specimens and should not be used as a sole basis for treatment. Nasal washings and aspirates are unacceptable for Xpert Xpress SARS-CoV-2/FLU/RSV testing.  Fact Sheet for Patients: BloggerCourse.com  Fact Sheet for Healthcare Providers: SeriousBroker.it  This test is not yet approved or cleared by the [place]  FDA and has been authorized for detection and/or  diagnosis of SARS-CoV-2 by FDA under an Emergency Use Authorization (EUA). This EUA will remain in effect (meaning this test can be used) for the duration of the COVID-19 declaration under Section 564(b)(1) of the Act, 21 U.S.C. section 360bbb-3(b)(1), unless the authorization is terminated or revoked.     Resp Syncytial Virus by PCR NEGATIVE NEGATIVE Final    Comment: (NOTE) Fact Sheet for Patients: BloggerCourse.com  Fact Sheet for Healthcare Providers: SeriousBroker.it  This test is not yet approved or cleared by the [place]  FDA and has been authorized for detection and/or diagnosis of SARS-CoV-2 by FDA under an Emergency Use Authorization (EUA). This EUA will remain in effect (meaning this test can be used) for the duration of the COVID-19 declaration under Section 564(b)(1) of the Act, 21 U.S.C. section 360bbb-3(b)(1), unless the authorization is terminated or revoked.  Performed at [facility], [address] [place], [place] [address]   Blood culture (routine x 2)     Status: None (Preliminary result)   Collection Time: 05/29/24 12:56 PM   Specimen: BLOOD  Result Value Ref Range Status   Specimen Description BLOOD RIGHT ANTECUBITAL  Final   Special  Requests   Final    BOTTLES DRAWN AEROBIC AND ANAEROBIC Blood Culture results may not be optimal due to an inadequate volume of blood received in culture bottles   Culture   Final    NO GROWTH < 24 HOURS Performed at [facility], [address] [place], [place] [address]    Report Status PENDING  Incomplete  Blood culture (routine x 2)     Status: None (Preliminary result)   Collection Time: 05/29/24 12:56 PM   Specimen: BLOOD  Result Value Ref Range Status   Specimen Description BLOOD LEFT ANTECUBITAL  Final   Special Requests   Final    BOTTLES DRAWN AEROBIC AND ANAEROBIC Blood Culture results may not be optimal due to an inadequate volume  of blood received in culture bottles   Culture   Final    NO GROWTH < 24 HOURS Performed at [facility], [address] [place], [place] [address]    Report Status PENDING  Incomplete  MRSA Next Gen by PCR, Nasal     Status: None   Collection Time: 05/29/24  2:58 PM   Specimen: Nasal Mucosa; Nasal Swab  Result Value Ref Range Status   MRSA by PCR Next Gen NOT DETECTED NOT DETECTED Final    Comment: (NOTE) The GeneXpert MRSA Assay (FDA approved for NASAL specimens only), is one component of a comprehensive MRSA colonization surveillance program. It is not intended to diagnose MRSA infection nor to guide or monitor treatment for MRSA infections. Test performance is not FDA approved in patients less than 27 years old. Performed at [facility], [address] [place], [place] [address]   Group A Strep by PCR     Status: None   Collection Time: 05/29/24  2:58 PM   Specimen: Throat; Sterile Swab  Result Value Ref Range Status   Group A Strep by PCR NOT DETECTED NOT DETECTED Final    Comment: Performed at [facility], [address] [place], [place] [address]  Respiratory (~20 pathogens) panel by PCR     Status: None   Collection Time: 05/29/24  3:00 PM  Result Value Ref Range Status   Adenovirus NOT DETECTED NOT DETECTED Final   Coronavirus 229E NOT DETECTED NOT DETECTED Final    Comment: (NOTE) The Coronavirus on the Respiratory Panel, DOES NOT test for the novel  Coronavirus (2019 nCoV)    Coronavirus HKU1 NOT DETECTED NOT DETECTED Final   Coronavirus NL63 NOT DETECTED NOT DETECTED Final   Coronavirus OC43 NOT DETECTED NOT DETECTED Final   Metapneumovirus NOT DETECTED NOT DETECTED Final   Rhinovirus / Enterovirus NOT DETECTED NOT DETECTED Final   Influenza A NOT DETECTED NOT DETECTED Final   Influenza B NOT DETECTED NOT DETECTED Final   Parainfluenza Virus 1 NOT DETECTED NOT DETECTED Final   Parainfluenza Virus 2 NOT DETECTED NOT DETECTED  Final   Parainfluenza Virus 3 NOT DETECTED NOT DETECTED Final   Parainfluenza Virus 4 NOT DETECTED NOT DETECTED Final   Respiratory Syncytial Virus NOT DETECTED NOT DETECTED Final   Bordetella pertussis NOT DETECTED NOT DETECTED Final   Bordetella Parapertussis NOT DETECTED NOT DETECTED Final   Chlamydophila pneumoniae NOT DETECTED NOT DETECTED Final   Mycoplasma pneumoniae NOT DETECTED NOT DETECTED Final    Comment: Performed at [facility], [address] [place], [place] [address]  MRSA Next Gen by PCR, Nasal     Status: None   Collection Time: 05/29/24  3:50 PM  Specimen: Nasal Mucosa; Nasal Swab  Result Value Ref Range Status   MRSA by PCR Next Gen NOT DETECTED NOT DETECTED Final    Comment: (NOTE) The GeneXpert MRSA Assay (FDA approved for NASAL specimens only), is one component of a comprehensive MRSA colonization surveillance program. It is not intended to diagnose MRSA infection nor to guide or monitor treatment for MRSA infections. Test performance is not FDA approved in patients less than 30 years old. Performed at [facility], [address] [place], [place] [address]     Coagulation Studies: Recent Labs    05/29/24 1256 05/30/24 0338  LABPROT 59.0* 69.1*  INR 6.4* 7.9*     Urinalysis: No results for input(s): COLORURINE, LABSPEC, PHURINE, GLUCOSEU, HGBUR, BILIRUBINUR, KETONESUR, PROTEINUR, UROBILINOGEN, NITRITE, LEUKOCYTESUR in the last 72 hours.  Invalid input(s): APPERANCEUR     Imaging: CT CHEST ABDOMEN PELVIS WO CONTRAST Result Date: 05/29/2024 CLINICAL DATA:  Septic shock without clear source new end-stage renal disease EXAM: CT CHEST, ABDOMEN AND PELVIS WITHOUT CONTRAST TECHNIQUE: Multidetector CT imaging of the chest, abdomen and pelvis was performed following the standard protocol without IV contrast. RADIATION DOSE REDUCTION: This exam was performed according to the departmental dose-optimization program  which includes automated exposure control, adjustment of the mA and/or kV according to patient size and/or use of iterative reconstruction technique. COMPARISON:  Ultrasound 05/17/2024, CT 05/15/2024, chest x-ray 05/29/2024, chest CT 11/08/2023 FINDINGS: CT CHEST FINDINGS Cardiovascular: Limited without intravenous contrast. Moderate aortic atherosclerosis. 4.1 cm diameter ascending aorta. Multi-vessel coronary vascular calcification. No sizable pericardial effusion. Left-sided IJ central venous catheter with tip cough in the SVC. Right sided dialysis catheter with tips at the SVC and cavoatrial junction. Mediastinum/Nodes: Patent trachea. No thyroid mass. No suspicious lymph nodes. Esophagus within normal limits Lungs/Pleura: Mild emphysema. Small left greater than right pleural effusions. Dependent atelectasis in the left lower lobe. Patchy foci of ground-glass disease in the upper lobes and right middle lobe. No pneumothorax. Musculoskeletal: Sternum appears intact. No acute osseous abnormality. CT ABDOMEN PELVIS FINDINGS Hepatobiliary: Liver cirrhosis. Distended gallbladder with small stones. No biliary dilatation Pancreas: Unremarkable. No pancreatic ductal dilatation or surrounding inflammatory changes. Spleen: Normal in size without focal abnormality. Adrenals/Urinary Tract: Adrenal glands are normal. No hydronephrosis. Several slightly dense exophytic lesions left kidney, largest off the superior pole and measuring 2.6 cm. The bladder is slightly thick walled but decompressed Stomach/Bowel: The stomach is within normal limits. Few mildly dilated loops of jejunum in the left upper quadrant but without obstruction. No definitive acute bowel wall thickening. Scattered diverticular disease of the colon Vascular/Lymphatic: Advanced aortic atherosclerosis. No definitive aneurysm. No suspicious lymph nodes. Postsurgical changes at the left groin. Reproductive: Prostate seeds Other: Negative for free air. Moderate  ascites in the abdomen and pelvis but overall decreased compared to the CT from September. Musculoskeletal: No acute or suspicious osseous abnormality. IMPRESSION: 1. Small left greater than right pleural effusions with dependent atelectasis in the left lower lobe. Patchy foci of ground-glass disease in the upper lobes and right middle lobe, suspect infectious or inflammatory. 2. Liver cirrhosis. Moderate ascites in the abdomen and pelvis but overall decreased compared to the CT from mid September. 3. Distended gallbladder with small stones. 4. Several slightly dense exophytic lesions left kidney, largest off the superior pole and measuring 2.6 cm. These are indeterminate in the absence of contrast. When the patient is clinically stable and able to follow directions and hold their breath (preferably as an outpatient) further evaluation with dedicated abdominal MRI  should be considered. 5. Aortic atherosclerosis. Maximum ascending aortic diameter of 4.1 cm. Recommend annual imaging followup by CTA or MRA. This recommendation follows 2010 ACCF/AHA/AATS/ACR/ASA/SCA/SCAI/SIR/STS/SVM Guidelines for the Diagnosis and Management of Patients with Thoracic Aortic Disease. Circulation. 2010; 121: Z733-z630. Aortic aneurysm NOS (ICD10-I71.9) Aortic Atherosclerosis (ICD10-I70.0). Electronically Signed   By: Luke Bun M.D.   On: 05/29/2024 18:26   DG Chest Port 1 View Result Date: 05/29/2024 CLINICAL DATA:  Central line EXAM: PORTABLE CHEST 1 VIEW COMPARISON:  05/29/2024 FINDINGS: Right-sided central venous catheter tips at the right atrial level. Left IJ central venous catheter tip at the proximal right atrium. Cardiomegaly with vascular congestion. Possible small left effusion. Increasing airspace disease left base. Aortic atherosclerosis. No definitive pneumothorax. IMPRESSION: 1. Right-sided central venous catheter tips at the atrial level. Left IJ central venous catheter tip at the proximal right atrium. No  definitive pneumothorax. 2. Cardiomegaly with vascular congestion. Increasing airspace disease at the left base which may be due to atelectasis or pneumonia. Small left effusion Electronically Signed   By: Luke Bun M.D.   On: 05/29/2024 17:35   DG Chest Portable 1 View Result Date: 05/29/2024 CLINICAL DATA:  Weakness.  Hypotensive. EXAM: PORTABLE CHEST 1 VIEW COMPARISON:  05/15/2024. FINDINGS: The heart size and mediastinal contours are within normal limits. Status post placement of right IJ double-lumen hemodialysis catheter with tips overlying the lower SVC and superior cavoatrial junction. Aortic atherosclerosis. Small left pleural effusion, similar to the prior exam. No overt pulmonary edema, focal consolidation, or pneumothorax. No acute osseous abnormality. IMPRESSION: 1. Small left pleural effusion, similar to the prior exam. 2. Interval placement of right IJ double-lumen hemodialysis catheter. Electronically Signed   By: Harrietta Sherry M.D.   On: 05/29/2024 13:50       Medications:    sodium chloride      norepinephrine  (LEVOPHED ) Adult infusion 18 mcg/min (05/30/24 9147)   piperacillin -tazobactam (ZOSYN )  IV 2.25 g (05/30/24 0509)   vasopressin  0.04 Units/min (05/30/24 0500)     Chlorhexidine  Gluconate Cloth  6 each Topical Daily   pantoprazole  (PROTONIX ) IV  40 mg Intravenous Q12H   docusate sodium , polyethylene glycol  Assessment/ Plan:  [name] is a 68 y.o.  male  with past medical conditions including hypertension, COPD, obstructive sleep apnea with CPAP, anemia, and chronic kidney disease stage IV, who was admitted to [facility] on 05/29/2024 for Septic shock (HCC) [A41.9, R65.21]  [facility] [place]/TTS/RT permcath  Acute kidney injury on chronic kidney disease stage IV. Baseline creatinine 2.77 with GFR 24 06/30/2024. CT abd/pelvis shows dense lesion on left kidney. Last dialysis treatment on Tuesday. Will plan for dialysis today, no UF due to presence of  pressors. Will attempt fluid removal when patient more stable. If unable to tolerate hemodialysis, can consider CRRT.      Lab Results  Component Value Date   CREATININE 6.89 (H) 05/30/2024   CREATININE 6.72 (H) 05/29/2024   CREATININE 5.39 (H) 05/24/2024    Intake/Output Summary (Last 24 hours) at 05/30/2024 1201 Last data filed at 05/30/2024 0824 Gross per 24 hour  Intake 4943.75 ml  Output 0 ml  Net 4943.75 ml   2. Hypotension likely due to shock secondary to liver cirrhosis. Pressors in place, attempting to wean. Levo restarted with dialysis treatment.   3. Anemia of chronic kidney disease Lab Results  Component Value Date   HGB 6.6 (L) 05/30/2024  Hemoglobin 6.6, patient has received blood transfusion and FFP. Will order Retacrit  10000 units.  4.  Secondary Hyperparathyroidism: with outpatient labs: PTH 52.7, phosphorus 4.1, calcium  8.6 on 7/2.  Lab Results  Component Value Date   CALCIUM  7.9 (L) 05/30/2024   CAION 1.21 08/08/2023   PHOS 4.9 (H) 05/30/2024   Will continue to monitor.    LOS: 1 Emilie Carp 10/2/202512:01 PM

## 2024-05-30 NOTE — Progress Notes (Signed)
 Pt receives outpt HD at [facility] TTS @ 11am. Navigator following to assist with any HD needs.    Suzen Satchel  Dialysis Navigator  [phone]

## 2024-05-30 NOTE — Plan of Care (Signed)

## 2024-05-30 NOTE — Progress Notes (Signed)
 Suspected Acute Gastrointestinal Bleed in the setting of Chronic Iron  Deficiency Anemia Supratherapeutic INR Patient had 2 small black tarry stools overnight, with drop in Hgb to 6.6. Patient remains on vasopressor support. - chronic anticoagulation on hold - Protonix  bolus 80 mg followed by 40 mg BID - 1 unit of pRBC's ordered - 1 unit of FFP ordered - Maintain up to date type & screen - continuous cardiac monitoring - NPO - Monitor for s/s of bleeding - H&H Q 6 h, Daily CBC & PT/ INR monitoring - Transfuse for Hgb <7 - GI consulted, appreciate input   Jenita Jama Meek, AGACNP-BC Acute Care Nurse Practitioner [facility]   [phone] / [phone] Please see Amion for details.

## 2024-05-30 NOTE — Progress Notes (Addendum)
 0800 Awake and alert. Pleasant. Talkative. Concerned about wife. Called home and satisfied that wife was ok. RBCs and FFP finished at change of shifts. Patient states he feels better. Levophed  weaned from 26 mcg to 18 mcg very quickly. 1100 Dialysis started and blood pressure dropped. Levophed  adjusted and Dr. Marcelino consulted for dialysis changes. 1130 Levophed  quickly increased to 38 mcg/min. Dialysis blood flow rate decreased and dialysis done. 1430 Dialysis complete. 1452 Levophed  ate decreased to 36 mcg/min. 1530 Patient had large tarry stool with some bright red streaks. Patient sated he felt better.1615 Requested Vitamin K  drip early.1830 Patient nibbling at food.States he is tired and not hungry. Continuing to wean Levophed .

## 2024-05-30 NOTE — Progress Notes (Signed)
 NAME:  [facility], MRN:  [id number], DOB:  [birth year], LOS: 1 ADMISSION DATE:  05/29/2024, CONSULTATION DATE:  05/29/24 REFERRING MD:  Dr. Claudene, CHIEF COMPLAINT:  Generalized weakness/hypotension   History of Present Illness:  [facility] is a 68 y.o. male who presented to [facility] via EMS from his home to be evaluated for hypotension and weakness. He reports his initial outpatient hemodialysis appointment was 05/28/2024 at [facility]. This initial appointment is following a recent admission at [facility], of which he was discharged on 05/24/24.   His overall weakness began yesterday following his dialysis appointment and has continued today, with the addition of upper URI symptoms to include minor congestion and a sore throat as of this morning. Upon EMS arrival, he was found to have a BP of 64/28, to which he received fluids and BP increased to 94/42. He reported a sore throat and generalized fatigue/weakness that resulted in a fall this morning. He also stated at baseline he has no issue ambulating with a walker.  Pertinent  Kevin History  ETOH Hypertensin Hyperlipidemia Congestive Heart Failure End Stage Renal Disease  Significant Hospital Events: Including procedures, antibiotic start and stop dates in addition to other pertinent events   05/29/24: Started on Levophed  in the ED following refractory hypotension. Midodrine  10mg  x 1. 1.5L fluids given without improvement. Transferred to the ICU for further management. 2L [facility] started with improvement in O2.  05/30/24: Remains on levophed  and started on vaso overnight. Minimal oxygen requirements. Oliguric with plan for HD today at 11.  Interim History / Subjective:  Patient has increasing pressor requirements, now on levophed  and vasopressin . See above under significant hospital events.  Micro Data: 05/29/24: Blood cultures x2 >> 05/29/24: MRSA PCR >> negative 05/29/24: Group A Strep PCR >> negative 05/29/24: Respiratory Panel PCR >>  negative 05/29/24: Legionella Urine Antigen >> 05/29/24: Strep Pneumo Urine Antigen >> 05/30/24: Viral Respiratory Panel >> negative  Objective    Blood pressure (!) 118/46, pulse (!) 56, temperature 98.3 F (36.8 C), temperature source Oral, resp. rate 13, height 5' 9 (1.753 m), weight 116.7 kg, SpO2 99%.        Intake/Output Summary (Last 24 hours) at 05/30/2024 9072 Last data filed at 05/30/2024 9175 Gross per 24 hour  Intake 4943.75 ml  Output 0 ml  Net 4943.75 ml   Filed Weights   05/29/24 1259 05/29/24 1530 05/30/24 0500  Weight: 113.2 kg 70.5 kg 116.7 kg    Examination: General: ill appearing but better today, no acute distress, resting in bed HENT: moist mucous membranes, anicteric, atraumatic, supple, no JVD Lungs: clear and equal breath sounds bilaterally, no wheezing/rales/rhonchi, non-labored Cardiovascular: sinus bradycardia, S1 S2, no m/r/g Abdomen: soft, nontender, bowel sounds x 4 Extremities: warm/dry, small erythematous blister of [facility] wrapped, sporadic bruising of bilateral upper extremities, 3+ pretibial edema, 2+ radial pulses / 1+pedal pulses Neuro: A&O x 4, follows commands, no focal neuro deficits, PERRL GU: deferred  Resolved problem list   Assessment and Plan   #Lactic Acidosis secondary to Septic Shock #Hypovolemic Shock secondary to Volume Depletion & Decompensated Liver Cirrhosis #HFpEF #Chronic Bradycardia - Hx: Hypertension, Hyperlipidemia, prolonged QT, Paroxysmal Atrial Fibrillation - Continuous cardiac monitoring - MAP goal of >65 - Vasopressors as indicated and wean as able - Continue Levophed  - Placed on Vasopressin  overnight - Mild elevation in troponin likely secondary to demand ischemia (68 > 78 > 101) - Trend lactics (5.8 > 4.5 > 2.9) - Monitor Coox and CVP      #  COPD #OSA on CPAP #Stable Left Pleural Effusion Hx: COPD, OSA on CPAP, former smoker - Supplemental O2 as needed; 2L [facility] - Maintain O2 >88-92% given COPD hx -  Intermittent CXR and ABG as needed - Duonebs as needed for dyspnea     #Alcoholic Liver Cirrhosis #Acute Decompensated Liver Cirrhosis #Transaminitis #Hypoalbuminemia #Portal Vein Thrombosis (Identified during previous admission) Hx: ETOH abuse 05/29/24 CT Chest/Abdomen/Pelvis: small bilateral pleural effusions, liver cirrhosis, distended gallbladder with small stones, exophytic lesions of left kidney, aortic atherosclerosis. - Monitor hepatic trends and coags - Hepatic trends mildly improved today - Diet: NPO - Constipation protocol as needed: Miralax  and Colace - GI prophylaxis: Protonix  40mg  - Holding Eliquis  for Portal Vein Thrombosis until active bleed is ruled out - GI consult ordered; appreciate input - Consider paracentesis once INR stable for evaluation of ascitic fluid to rule out SBP - US  Liver Doppler ordered to evaluate Portal Vein Thrombosis     #ESRD requiring Hemodialysis #Acute Kidney Injury Superimposed on CKD Stage 4 - S/P Perma-Cath  #Anion Gap Metabolic Acidosis #Oliguria - Strict I/O - Trend BMP, Mg and Phos - Replace electrolytes per protocol; Mg replaced this am - Monitor renal function - Creatinine 6.72 > 6.89 - Diurese as able when renal function permits - Will hold IVF given his oliguria - Nephrology following; appreciate input     #Suspected Acute GI Bleed in setting of Acute on Chronic Iron  Deficiency Anemia #Thrombocytopenia #Supratherapeutic INR Hx: Iron  Deficiency Anemia - Trend CBC - Monitor for s/sx of bleeding - Monitor H/H and INR (6.4 > 7.9) - Hemoglobin 7.7 > 6.6 - Transfuse if HBG <7 - Completed 1 unit pRBC's and 1 unit FFP - Given Protonix  bolus overnight following two melenic stools - Vitamin K  as indicated for INR control - Hold DVT prophylaxis and home anticoagulants (Eliquis ) until active bleed ruled out - Tolerating diet     ENDO - Hypo/hyperglycemia protocol - CBG Q4H     #Sepsis of Unknown Etiology #?Possible  Cellulitis of [facility] - Trend WBC and fever curve - Remains afebrile and WBC trending down - Blood cultures pending - Respiratory Viral Panel negative - Strep Pneumo and Legionella Urinary Antigen pending - Continue broad spectrum ABX: Zosyn   - Narrow ABX pending culture and sensitivity results - Appreciate pharmacy consult - Monitor [facility] for further cellulitic changes - Wound culture pending     #Prior ETOH Abuse - Provide supportive care as needed   Labs   CBC: Recent Labs  Lab 05/24/24 0435 05/29/24 1256 05/29/24 2040 05/30/24 0338  WBC 11.4* 21.1* 32.3* 29.9*  NEUTROABS  --  18.6* 27.5*  --   HGB 10.0* 7.9* 7.7* 6.6*  HCT 31.1* 24.9* 24.4* 20.5*  MCV 101.3* 106.9* 107.0* 105.1*  PLT 116* 117* 180 163    Basic Metabolic Panel: Recent Labs  Lab 05/24/24 1404 05/29/24 1256 05/30/24 0338  NA 135 137 138  K 3.5 3.5 4.0  CL 98 101 100  CO2 24 19* 25  GLUCOSE 121* 82 147*  BUN 49* 71* 74*  CREATININE 5.39* 6.72* 6.89*  CALCIUM  8.0* 7.9* 7.9*  MG  --  2.1 1.7  PHOS 5.3*  --  4.9*   GFR: Estimated Creatinine Clearance: 13.3 mL/min (A) (by C-G formula based on SCr of 6.89 mg/dL (H)). Recent Labs  Lab 05/24/24 0435 05/29/24 1256 05/29/24 1256 05/29/24 1458 05/29/24 2040 05/29/24 2349 05/30/24 0337 05/30/24 0338  PROCALCITON  --  2.62  --   --   --   --   --   --  WBC 11.4* 21.1*  --   --  32.3*  --   --  29.9*  LATICACIDVEN  --  5.8*   < > 4.5* 3.2* 3.2* 2.9*  --    < > = values in this interval not displayed.    Liver Function Tests: Recent Labs  Lab 05/24/24 1404 05/29/24 1256 05/30/24 0338  AST  --  301* 270*  ALT  --  90* 91*  ALKPHOS  --  226* 205*  BILITOT  --  3.1* 3.6*  PROT  --  4.9* 4.4*  ALBUMIN  2.7* 2.3* 2.0*   No results for input(s): LIPASE, AMYLASE in the last 168 hours. No results for input(s): AMMONIA in the last 168 hours.  ABG    Component Value Date/Time   PHART 7.298 (L) 11/27/2021 0345   PCO2ART 39.7 11/27/2021  0345   PO2ART 116 (H) 11/27/2021 0345   HCO3 21.9 05/29/2024 2040   TCO2 21 (L) 08/08/2023 0916   ACIDBASEDEF 2.8 (H) 05/29/2024 2040   O2SAT 78.9 05/29/2024 2040     Coagulation Profile: Recent Labs  Lab 05/29/24 1256 05/30/24 0338  INR 6.4* 7.9*    Cardiac Enzymes: No results for input(s): CKTOTAL, CKMB, CKMBINDEX, TROPONINI in the last 168 hours.  HbA1C: Hgb A1c MFr Bld  Date/Time Value Ref Range Status  11/27/2021 03:28 AM 5.6 4.8 - 5.6 % Final    Comment:    (NOTE) Pre diabetes:          5.7%-6.4%  Diabetes:              >6.4%  Glycemic control for   <7.0% adults with diabetes     CBG: Recent Labs  Lab 05/29/24 1540  GLUCAP 113*    Review of Systems:   Patient reports mild shortness of breath. All other systems are negative.  Past [facility] History:  He,  has a past Kevin history of Alcoholic cirrhosis of liver (HCC), Anemia (2024), Cataract (2024), CHF (congestive heart failure) (HCC), Chronic gouty arthritis, CKD (chronic kidney disease), stage III (HCC), Clotting disorder, Complication of anesthesia, COPD (chronic obstructive pulmonary disease) (HCC), Hyperlipidemia, Hypertension, Hyperuricemia, Peripheral arterial disease, Pre-diabetes, Prostate cancer (HCC) (2023), and Sleep apnea.   Surgical History:   Past Surgical History:  Procedure Laterality Date   ABDOMINAL AORTOGRAM W/LOWER EXTREMITY N/A 10/20/2021   Procedure: ABDOMINAL AORTOGRAM W/LOWER EXTREMITY;  Surgeon: Darron Deatrice LABOR, MD;  Location: [facility];  Service: Cardiovascular;  Laterality: N/A;   ABDOMINAL AORTOGRAM W/LOWER EXTREMITY N/A 08/08/2023   Procedure: ABDOMINAL AORTOGRAM W/LOWER EXTREMITY;  Surgeon: Serene Gaile ORN, MD;  Location: [facility];  Service: Cardiovascular;  Laterality: N/A;   ALLOGRAFT APPLICATION Bilateral 12/22/2021   Procedure: ALLOGRAFT APPLICATION;  Surgeon: Serene Gaile ORN, MD;  Location: [facility] OR;  Service: Vascular;  Laterality:  Bilateral;   ANGIOPLASTY Left 11/26/2021   Procedure: LEFT INFRARENAL LITHOTRIPSY;  Surgeon: Serene Gaile ORN, MD;  Location[facility];  Service: Vascular;  Laterality: Left;   APPLICATION OF WOUND VAC Bilateral 12/13/2021   Procedure: APPLICATION OF WOUND VAC;  Surgeon: Lanis Fonda BRAVO, MD;  Location: [facility] OR;  Service: Vascular;  Laterality: Bilateral;   CARDIAC CATHETERIZATION     [facility]   CATARACT EXTRACTION W/PHACO Left 04/04/2023   Procedure: CATARACT EXTRACTION PHACO AND INTRAOCULAR LENS PLACEMENT (IOC) LEFT 8.71 00:45.5;  Surgeon: Jaye Fallow, MD;  Location: [facility];  Service: Ophthalmology;  Laterality: Left;   CATARACT EXTRACTION W/PHACO Right 04/18/2023   Procedure: CATARACT  EXTRACTION PHACO AND INTRAOCULAR LENS PLACEMENT (IOC) RIGHT 4.31 00:8.2;  Surgeon: Jaye Fallow, MD;  Location: [facility];  Service: Ophthalmology;  Laterality: Right;   COLONOSCOPY     COLONOSCOPY WITH PROPOFOL  N/A 01/17/2018   Procedure: COLONOSCOPY WITH PROPOFOL ;  Surgeon: Toledo, Ladell POUR, MD;  Location: [facility];  Service: Gastroenterology;  Laterality: N/A;   COLONOSCOPY WITH PROPOFOL  N/A 09/12/2022   Procedure: COLONOSCOPY WITH PROPOFOL ;  Surgeon: Maryruth Ole DASEN, MD;  Location: [facility];  Service: Endoscopy;  Laterality: N/A;   COLONOSCOPY WITH PROPOFOL  N/A 09/05/2023   Procedure: COLONOSCOPY WITH PROPOFOL ;  Surgeon: Maryruth Ole DASEN, MD;  Location: [facility];  Service: Endoscopy;  Laterality: N/A;   DIALYSIS/PERMA CATHETER INSERTION Right 05/21/2024   Procedure: DIALYSIS/PERMA CATHETER INSERTION;  Surgeon: Jama Cordella MATSU, MD;  Location: [facility];  Service: Cardiovascular;  Laterality: Right;   ENDARTERECTOMY FEMORAL Bilateral 11/26/2021   Procedure: BILATERAL FEMORAL ENDARTERECTOMY WITH VEIN PATCH ANGIOPLASTY;  Surgeon: Serene Gaile ORN, MD;  Location: [facility];  Service: Vascular;  Laterality: Bilateral;   ENTEROSCOPY N/A 09/05/2023    Procedure: ENTEROSCOPY;  Surgeon: Maryruth Ole DASEN, MD;  Location: [facility];  Service: Endoscopy;  Laterality: N/A;   ESOPHAGOGASTRODUODENOSCOPY (EGD) WITH PROPOFOL  N/A 01/17/2018   Procedure: ESOPHAGOGASTRODUODENOSCOPY (EGD) WITH PROPOFOL ;  Surgeon: Toledo, Ladell POUR, MD;  Location: [facility];  Service: Gastroenterology;  Laterality: N/A;   ESOPHAGOGASTRODUODENOSCOPY (EGD) WITH PROPOFOL  N/A 09/12/2022   Procedure: ESOPHAGOGASTRODUODENOSCOPY (EGD) WITH PROPOFOL ;  Surgeon: Maryruth Ole DASEN, MD;  Location: [facility];  Service: Endoscopy;  Laterality: N/A;   EYE SURGERY     GROIN DEBRIDEMENT Bilateral 12/16/2021   Procedure: IRRIGATION AND DEBRIDEMENT OF BILATERAL GROINS;  Surgeon: Serene Gaile ORN, MD;  Location: [facility];  Service: Vascular;  Laterality: Bilateral;   INCISION AND DRAINAGE OF WOUND Bilateral 12/13/2021   Procedure: IRRIGATION AND DEBRIDEMENT OF BILATERAL GROIN WOUNDS;  Surgeon: Lanis Fonda BRAVO, MD;  Location: [facility];  Service: Vascular;  Laterality: Bilateral;   INSERTION OF ILIAC STENT Bilateral 11/26/2021   Procedure: BILATERAL ILIAC STENTING USING 10mmX80mm INNOVA STENT ON LEFT ILIAC AND 43mmX59mm, 102mmX79mm VBX AND 15mmX7.5cm VIABAHN STENT ON RIGHT ILIAC;  Surgeon: Serene Gaile ORN, MD;  Location: [facility];  Service: Vascular;  Laterality: Bilateral;   INSERTION OF SEEDS IN PROSTATE     ORIF ANKLE FRACTURE Right    PERIPHERAL INTRAVASCULAR LITHOTRIPSY  08/08/2023   Procedure: PERIPHERAL INTRAVASCULAR LITHOTRIPSY;  Surgeon: Serene Gaile ORN, MD;  Location: [facility];  Service: Cardiovascular;;   PROSTATE BIOPSY N/A 04/01/2021   Procedure: PROSTATE BIOPSY GRAYCE;  Surgeon: Kassie Ozell SAUNDERS, MD;  Location: [facility];  Service: Urology;  Laterality: N/A;   VEIN HARVEST Bilateral 11/26/2021   Procedure: VEIN HARVEST OF BILATERAL SPAHENOUS VEINS;  Surgeon: Serene Gaile ORN, MD;  Location: [facility];  Service: Vascular;  Laterality: Bilateral;   WOUND DEBRIDEMENT  Bilateral 12/22/2021   Procedure: INCISION AND DEBRIDEMENT OF BILATERAL GROINS;  Surgeon: Serene Gaile ORN, MD;  Location: [facility];  Service: Vascular;  Laterality: Bilateral;     Social History:   reports that he quit smoking about 9 years ago. His smoking use included cigarettes. He started smoking about 56 years ago. He has a 47 pack-year smoking history. He has never been exposed to tobacco smoke. He has never used smokeless tobacco. He reports that he does not currently use alcohol. He reports that he does not use drugs.   Family History:  His family history includes  Heart disease in his brother and mother.   Allergies Allergies  Allergen Reactions   Jardiance [Empagliflozin]     Loss of balance, hypoglycemic      Home Medications  Prior to Admission medications   Medication Sig Start Date End Date Taking? Authorizing Provider  allopurinol  (ZYLOPRIM ) 100 MG tablet Take 150 mg by mouth daily.   Yes [provider]  amiodarone  (PACERONE ) 200 MG tablet Take 200 mg by mouth 2 (two) times daily.   Yes [provider]  APIXABAN  (ELIQUIS ) VTE STARTER PACK (10MG  AND 5MG ) Take as directed on package: start with two-5mg  tablets twice daily for 7 days. On day 8, switch to one-5mg  tablet twice daily. 05/24/24  Yes Jens Durand, MD  aspirin  EC 81 MG tablet Take 81 mg by mouth every morning.   Yes [provider]  benzonatate (TESSALON) 100 MG capsule Take 200 mg by mouth 3 (three) times daily as needed. 05/17/24  Yes [provider]  Cholecalciferol  (VITAMIN D3) 25 MCG (1000 UT) CAPS Take 2,000 Units by mouth daily.   Yes [provider]  ezetimibe  (ZETIA ) 10 MG tablet Take 1 tablet (10 mg total) by mouth daily. 04/10/18  Yes Gollan, Timothy J, MD  iron  polysaccharides (NIFEREX) 150 MG capsule Take 1 capsule (150 mg total) by mouth 2 (two) times daily. 03/11/24 06/09/24 Yes Sreenath, Sudheer B, MD  midodrine  (PROAMATINE ) 10 MG tablet Take 1 tablet (10 mg  total) by mouth 3 (three) times daily with meals. 05/24/24  Yes Jens Durand, MD  pantoprazole  (PROTONIX ) 40 MG tablet Take 40 mg by mouth daily.   Yes [provider]  simvastatin  (ZOCOR ) 20 MG tablet Take 20 mg by mouth in the morning.   Yes [provider]  triamcinolone  ointment (KENALOG ) 0.1 % Apply 1 Application topically 2 (two) times daily as needed (psoriasis). 07/18/23  Yes [provider]  zolpidem  (AMBIEN ) 10 MG tablet Take 10 mg by mouth at bedtime.   Yes [provider]     Critical care time: 55 minutes    Exander Shaul, PA-C Pulmonary/Critical Care PCCM Team Contact Info: [phone]

## 2024-05-31 DIAGNOSIS — K921 Melena: Secondary | ICD-10-CM | POA: Diagnosis not present

## 2024-05-31 LAB — PREPARE FRESH FROZEN PLASMA: Unit division: 0

## 2024-05-31 LAB — BASIC METABOLIC PANEL WITH GFR
Anion gap: 13 (ref 5–15)
BUN: 60 mg/dL — ABNORMAL HIGH (ref 8–23)
CO2: 22 mmol/L (ref 22–32)
Calcium: 8 mg/dL — ABNORMAL LOW (ref 8.9–10.3)
Chloride: 98 mmol/L (ref 98–111)
Creatinine, Ser: 5.37 mg/dL — ABNORMAL HIGH (ref 0.61–1.24)
GFR, Estimated: 11 mL/min — ABNORMAL LOW (ref >60)
Glucose, Bld: 128 mg/dL — ABNORMAL HIGH (ref 70–99)
Potassium: 3.9 mmol/L (ref 3.5–5.1)
Sodium: 133 mmol/L — ABNORMAL LOW (ref 135–145)

## 2024-05-31 LAB — CBC
HCT: 21 % — ABNORMAL LOW (ref 39.0–52.0)
Hemoglobin: 6.9 g/dL — ABNORMAL LOW (ref 13.0–17.0)
MCH: 32.5 pg (ref 26.0–34.0)
MCHC: 32.9 g/dL (ref 30.0–36.0)
MCV: 99.1 fL (ref 80.0–100.0)
Platelets: 116 K/uL — ABNORMAL LOW (ref 150–400)
RBC: 2.12 MIL/uL — ABNORMAL LOW (ref 4.22–5.81)
WBC: 27.4 K/uL — ABNORMAL HIGH (ref 4.0–10.5)
nRBC: 1.4 % — ABNORMAL HIGH (ref 0.0–0.2)

## 2024-05-31 LAB — HEPATIC FUNCTION PANEL
ALT: 94 U/L — ABNORMAL HIGH (ref 0–44)
AST: 250 U/L — ABNORMAL HIGH (ref 15–41)
Albumin: 2.1 g/dL — ABNORMAL LOW (ref 3.5–5.0)
Alkaline Phosphatase: 206 U/L — ABNORMAL HIGH (ref 38–126)
Bilirubin, Direct: 3.2 mg/dL — ABNORMAL HIGH (ref 0.0–0.2)
Indirect Bilirubin: 2.2 mg/dL — ABNORMAL HIGH (ref 0.3–0.9)
Total Bilirubin: 5.4 mg/dL — ABNORMAL HIGH (ref 0.0–1.2)
Total Protein: 4.6 g/dL — ABNORMAL LOW (ref 6.5–8.1)

## 2024-05-31 LAB — PROTIME-INR
INR: 4.9 (ref 0.8–1.2)
Prothrombin Time: 47.9 s — ABNORMAL HIGH (ref 11.4–15.2)

## 2024-05-31 LAB — BPAM FFP
Blood Product Expiration Date: 202510042359
ISSUE DATE / TIME: 202510020618
Unit Type and Rh: 2800

## 2024-05-31 LAB — COOXEMETRY PANEL
Carboxyhemoglobin: 1.8 % — ABNORMAL HIGH (ref 0.5–1.5)
Methemoglobin: <0.7 % (ref 0.0–1.5)
O2 Saturation: 75.9 %
Total hemoglobin: 6.9 g/dL — CL (ref 12.0–16.0)
Total oxygen content: 74.1 %

## 2024-05-31 LAB — PHOSPHORUS: Phosphorus: 4 mg/dL (ref 2.5–4.6)

## 2024-05-31 LAB — RENAL FUNCTION PANEL
Albumin: 2.1 g/dL — ABNORMAL LOW (ref 3.5–5.0)
Anion gap: 14 (ref 5–15)
BUN: 52 mg/dL — ABNORMAL HIGH (ref 8–23)
CO2: 22 mmol/L (ref 22–32)
Calcium: 7.6 mg/dL — ABNORMAL LOW (ref 8.9–10.3)
Chloride: 97 mmol/L — ABNORMAL LOW (ref 98–111)
Creatinine, Ser: 4.62 mg/dL — ABNORMAL HIGH (ref 0.61–1.24)
GFR, Estimated: 13 mL/min — ABNORMAL LOW (ref >60)
Glucose, Bld: 140 mg/dL — ABNORMAL HIGH (ref 70–99)
Phosphorus: 3.5 mg/dL (ref 2.5–4.6)
Potassium: 3.8 mmol/L (ref 3.5–5.1)
Sodium: 133 mmol/L — ABNORMAL LOW (ref 135–145)

## 2024-05-31 LAB — HEMOGLOBIN AND HEMATOCRIT, BLOOD
HCT: 22.1 % — ABNORMAL LOW (ref 39.0–52.0)
HCT: 22.1 % — ABNORMAL LOW (ref 39.0–52.0)
HCT: 22.2 % — ABNORMAL LOW (ref 39.0–52.0)
HCT: 23.6 % — ABNORMAL LOW (ref 39.0–52.0)
HCT: 24.1 % — ABNORMAL LOW (ref 39.0–52.0)
Hemoglobin: 7.2 g/dL — ABNORMAL LOW (ref 13.0–17.0)
Hemoglobin: 7.4 g/dL — ABNORMAL LOW (ref 13.0–17.0)
Hemoglobin: 7.5 g/dL — ABNORMAL LOW (ref 13.0–17.0)
Hemoglobin: 7.8 g/dL — ABNORMAL LOW (ref 13.0–17.0)
Hemoglobin: 8.1 g/dL — ABNORMAL LOW (ref 13.0–17.0)

## 2024-05-31 LAB — PREPARE RBC (CROSSMATCH)

## 2024-05-31 LAB — LACTIC ACID, PLASMA
Lactic Acid, Venous: 2.7 mmol/L (ref 0.5–1.9)
Lactic Acid, Venous: 2.6 mmol/L (ref 0.5–1.9)

## 2024-05-31 LAB — MAGNESIUM: Magnesium: 2.1 mg/dL (ref 1.7–2.4)

## 2024-05-31 MED ORDER — VITAMIN C 500 MG PO TABS
500.0000 mg | ORAL_TABLET | Freq: Two times a day (BID) | ORAL | Status: DC
Start: 2024-05-31 — End: 2024-06-13
  Administered 2024-05-31 – 2024-06-12 (×25): 500 mg via ORAL
  Filled 2024-05-31 (×25): qty 1

## 2024-05-31 MED ORDER — NEPRO/CARBSTEADY PO LIQD
237.0000 mL | Freq: Three times a day (TID) | ORAL | Status: DC
  Administered 2024-05-31 – 2024-06-04 (×9): 237 mL via ORAL

## 2024-05-31 MED ORDER — ALBUMIN HUMAN 25 % IV SOLN
25.0000 g | Freq: Two times a day (BID) | INTRAVENOUS | Status: AC
  Administered 2024-05-31 – 2024-06-01 (×3): 25 g via INTRAVENOUS
  Administered 2024-06-01: 12.5 g via INTRAVENOUS
  Filled 2024-05-31 (×4): qty 100

## 2024-05-31 MED ORDER — EPOETIN ALFA-EPBX 10000 UNIT/ML IJ SOLN
10000.0000 [IU] | INTRAMUSCULAR | Status: DC
  Administered 2024-06-01 – 2024-06-11 (×5): 10000 [IU] via SUBCUTANEOUS
  Filled 2024-05-31 (×6): qty 2

## 2024-05-31 MED ORDER — PRISMASOL BGK 4/2.5 32-4-2.5 MEQ/L EC SOLN: Status: DC

## 2024-05-31 MED ORDER — THIAMINE HCL 100 MG PO TABS
100.0000 mg | ORAL_TABLET | Freq: Every day | ORAL | Status: AC
  Administered 2024-06-01 – 2024-06-07 (×7): 100 mg via ORAL
  Filled 2024-05-31 (×14): qty 1

## 2024-05-31 MED ORDER — VITAMIN K1 10 MG/ML IJ SOLN
10.0000 mg | Freq: Once | INTRAVENOUS | Status: AC
  Administered 2024-05-31: 10 mg via INTRAVENOUS
  Filled 2024-05-31: qty 1

## 2024-05-31 MED ORDER — SODIUM CHLORIDE 0.9% IV SOLUTION: Freq: Once | INTRAVENOUS | Status: AC

## 2024-05-31 MED ORDER — RENA-VITE PO TABS
1.0000 | ORAL_TABLET | Freq: Every day | ORAL | Status: DC
  Administered 2024-05-31 – 2024-06-12 (×13): 1 via ORAL
  Filled 2024-05-31 (×13): qty 1

## 2024-05-31 MED ORDER — PIPERACILLIN-TAZOBACTAM 3.375 G IVPB 30 MIN
3.3750 g | Freq: Four times a day (QID) | INTRAVENOUS | Status: AC
  Administered 2024-05-31 – 2024-06-06 (×26): 3.375 g via INTRAVENOUS
  Filled 2024-05-31 (×47): qty 50

## 2024-05-31 MED ORDER — PIPERACILLIN-TAZOBACTAM 3.375 G IVPB: 3.3750 g | Freq: Four times a day (QID) | INTRAVENOUS | Status: DC

## 2024-05-31 MED ORDER — HYDROCORTISONE SOD SUC (PF) 100 MG IJ SOLR
50.0000 mg | Freq: Four times a day (QID) | INTRAMUSCULAR | Status: DC
  Administered 2024-05-31 – 2024-06-06 (×25): 50 mg via INTRAVENOUS
  Filled 2024-05-31 (×25): qty 2

## 2024-05-31 MED ORDER — ALBUMIN HUMAN 25 % IV SOLN
12.5000 g | Freq: Two times a day (BID) | INTRAVENOUS | Status: DC
Start: 2024-05-31 — End: 2024-05-31

## 2024-05-31 MED ORDER — HEPARIN SODIUM (PORCINE) 1000 UNIT/ML DIALYSIS
1000.0000 [IU] | INTRAMUSCULAR | Status: DC | PRN
  Administered 2024-06-05: 5000 [IU] via INTRAVENOUS_CENTRAL
  Administered 2024-06-06: 4200 [IU] via INTRAVENOUS_CENTRAL
  Filled 2024-05-31: qty 5
  Filled 2024-05-31: qty 4

## 2024-05-31 NOTE — Progress Notes (Signed)
 [facility]  ROUNDING NOTE   Subjective:   Kevin Gross  is a 68 y.o.  male  with past medical conditions including hypertension, COPD, obstructive sleep apnea with CPAP, anemia, and recent acute kidney injury requiring hemodialysis. Patient presents to ED with decreased blood pressure and has been admitted to ICU for Septic shock (HCC) [A41.9, R65.21]  Patient seen and evaluated at bedside. Alert and oriented. States he went to dialysis on Tuesday, no complications.   Update: Patient seen and evaluated at bedside No complaints Remains on norepinephrine  and vasopressin  Also ordered blood transfusion and albumin  Weeping from upper extremities   Objective:  Vital signs in last 24 hours:  Temp:  [97.7 F (36.5 C)-98.6 F (37 C)] 98.2 F (36.8 C) (10/03 1039) Pulse Rate:  [57-80] 57 (10/03 1039) Resp:  [0-20] 15 (10/03 1039) BP: (74-124)/(41-80) 116/46 (10/03 1039) SpO2:  [81 %-100 %] 95 % (10/03 1039) Weight:  [72.6 kg-120.3 kg] 72.6 kg (10/03 0500)  Weight change: 7.1 kg Filed Weights   05/30/24 0500 05/30/24 1437 05/31/24 0500  Weight: 116.7 kg 120.3 kg 72.6 kg    Intake/Output: I/O last 3 completed shifts: In: 5101.5 [I.V.:3258.4; Blood:1593; IV Piggyback:250] Out: 0    Intake/Output this shift:  Total I/O In: 427.4 [P.O.:240; I.V.:187.4] Out: -   Physical Exam: General: NAD  Head: Normocephalic, atraumatic. Moist oral mucosal membranes  Eyes: Anicteric  Lungs:  Clear to auscultation, normal effort  Heart: Regular rate and rhythm  Abdomen:  Soft, nontender  Extremities: 3+ peripheral edema.  Neurologic: Awake, alert, conversant  Skin: Generalized ecchymosis, weeping  Access: Right IJ PermCath    Basic Metabolic Panel: Recent Labs  Lab 05/24/24 1404 05/29/24 1256 05/30/24 0338 05/31/24 0459  NA 135 137 138 133*  K 3.5 3.5 4.0 3.9  CL 98 101 100 98  CO2 24 19* 25 22  GLUCOSE 121* 82 147* 128*  BUN 49* 71* 74* 60*  CREATININE 5.39*  6.72* 6.89* 5.37*  CALCIUM  8.0* 7.9* 7.9* 8.0*  MG  --  2.1 1.7 2.1  PHOS 5.3*  --  4.9* 4.0    Liver Function Tests: Recent Labs  Lab 05/24/24 1404 05/29/24 1256 05/30/24 0338 05/31/24 0459  AST  --  301* 270* 250*  ALT  --  90* 91* 94*  ALKPHOS  --  226* 205* 206*  BILITOT  --  3.1* 3.6* 5.4*  PROT  --  4.9* 4.4* 4.6*  ALBUMIN  2.7* 2.3* 2.0* 2.1*   No results for input(s): LIPASE, AMYLASE in the last 168 hours. No results for input(s): AMMONIA in the last 168 hours.   CBC: Recent Labs  Lab 05/29/24 1256 05/29/24 2040 05/30/24 0338 05/30/24 1400 05/30/24 1800 05/31/24 0026 05/31/24 0459 05/31/24 0638  WBC 21.1* 32.3* 29.9*  --   --   --   --  27.4*  NEUTROABS 18.6* 27.5*  --   --   --   --   --   --   HGB 7.9* 7.7* 6.6* 8.1* 8.0* 7.4* 7.2* 6.9*  HCT 24.9* 24.4* 20.5* 24.6* 24.0* 22.2* 22.1* 21.0*  MCV 106.9* 107.0* 105.1*  --   --   --   --  99.1  PLT 117* 180 163  --   --   --   --  116*    Cardiac Enzymes: No results for input(s): CKTOTAL, CKMB, CKMBINDEX, TROPONINI in the last 168 hours.  BNP: Invalid input(s): POCBNP  CBG: Recent Labs  Lab 05/29/24 1540  GLUCAP 113*     Microbiology: Results for orders placed or performed during the hospital encounter of 05/29/24  Resp panel by RT-PCR (RSV, Flu A&B, Covid) Anterior Nasal Swab     Status: None   Collection Time: 05/29/24 12:56 PM   Specimen: Anterior Nasal Swab  Result Value Ref Range Status   SARS Coronavirus 2 by RT PCR NEGATIVE NEGATIVE Final    Comment: (NOTE) SARS-CoV-2 target nucleic acids are NOT DETECTED.  The SARS-CoV-2 RNA is generally detectable in upper respiratory specimens during the acute phase of infection. The lowest concentration of SARS-CoV-2 viral copies this assay can detect is 138 copies/mL. A negative result does not preclude SARS-Cov-2 infection and should not be used as the sole basis for treatment or other patient management decisions. A negative  result may occur with  improper specimen collection/handling, submission of specimen other than nasopharyngeal swab, presence of viral mutation(s) within the areas targeted by this assay, and inadequate number of viral copies(<138 copies/mL). A negative result must be combined with clinical observations, patient history, and epidemiological information. The expected result is Negative.  Fact Sheet for Patients:  BloggerCourse.com  Fact Sheet for Healthcare Providers:  SeriousBroker.it  This test is no t yet approved or cleared by the [place]  FDA and  has been authorized for detection and/or diagnosis of SARS-CoV-2 by FDA under an Emergency Use Authorization (EUA). This EUA will remain  in effect (meaning this test can be used) for the duration of the COVID-19 declaration under Section 564(b)(1) of the Act, 21 U.S.C.section 360bbb-3(b)(1), unless the authorization is terminated  or revoked sooner.       Influenza A by PCR NEGATIVE NEGATIVE Final   Influenza B by PCR NEGATIVE NEGATIVE Final    Comment: (NOTE) The Xpert Xpress SARS-CoV-2/FLU/RSV plus assay is intended as an aid in the diagnosis of influenza from Nasopharyngeal swab specimens and should not be used as a sole basis for treatment. Nasal washings and aspirates are unacceptable for Xpert Xpress SARS-CoV-2/FLU/RSV testing.  Fact Sheet for Patients: BloggerCourse.com  Fact Sheet for Healthcare Providers: SeriousBroker.it  This test is not yet approved or cleared by the [place]  FDA and has been authorized for detection and/or diagnosis of SARS-CoV-2 by FDA under an Emergency Use Authorization (EUA). This EUA will remain in effect (meaning this test can be used) for the duration of the COVID-19 declaration under Section 564(b)(1) of the Act, 21 U.S.C. section 360bbb-3(b)(1), unless the authorization is  terminated or revoked.     Resp Syncytial Virus by PCR NEGATIVE NEGATIVE Final    Comment: (NOTE) Fact Sheet for Patients: BloggerCourse.com  Fact Sheet for Healthcare Providers: SeriousBroker.it  This test is not yet approved or cleared by the [place]  FDA and has been authorized for detection and/or diagnosis of SARS-CoV-2 by FDA under an Emergency Use Authorization (EUA). This EUA will remain in effect (meaning this test can be used) for the duration of the COVID-19 declaration under Section 564(b)(1) of the Act, 21 U.S.C. section 360bbb-3(b)(1), unless the authorization is terminated or revoked.  Performed at [facility], [address] [place], [place] [address]   Blood culture (routine x 2)     Status: None (Preliminary result)   Collection Time: 05/29/24 12:56 PM   Specimen: BLOOD  Result Value Ref Range Status   Specimen Description BLOOD RIGHT ANTECUBITAL  Final   Special Requests   Final    BOTTLES DRAWN AEROBIC AND ANAEROBIC Blood Culture results may not be optimal  due to an inadequate volume of blood received in culture bottles   Culture   Final    NO GROWTH 2 DAYS Performed at [facility], [address] [place], [place] [address]    Report Status PENDING  Incomplete  Blood culture (routine x 2)     Status: None (Preliminary result)   Collection Time: 05/29/24 12:56 PM   Specimen: BLOOD  Result Value Ref Range Status   Specimen Description BLOOD LEFT ANTECUBITAL  Final   Special Requests   Final    BOTTLES DRAWN AEROBIC AND ANAEROBIC Blood Culture results may not be optimal due to an inadequate volume of blood received in culture bottles   Culture   Final    NO GROWTH 2 DAYS Performed at [facility], [address], [place], [place] [address]    Report Status PENDING  Incomplete  MRSA Next Gen by PCR, Nasal     Status: None   Collection Time: 05/29/24  2:58 PM    Specimen: Nasal Mucosa; Nasal Swab  Result Value Ref Range Status   MRSA by PCR Next Gen NOT DETECTED NOT DETECTED Final    Comment: (NOTE) The GeneXpert MRSA Assay (FDA approved for NASAL specimens only), is one component of a comprehensive MRSA colonization surveillance program. It is not intended to diagnose MRSA infection nor to guide or monitor treatment for MRSA infections. Test performance is not FDA approved in patients less than 46 years old. Performed at [facility], [address] [place], [place] [address]   Group A Strep by PCR     Status: None   Collection Time: 05/29/24  2:58 PM   Specimen: Throat; Sterile Swab  Result Value Ref Range Status   Group A Strep by PCR NOT DETECTED NOT DETECTED Final    Comment: Performed at [facility], [address] [place], [place] [address]  Respiratory (~20 pathogens) panel by PCR     Status: None   Collection Time: 05/29/24  3:00 PM  Result Value Ref Range Status   Adenovirus NOT DETECTED NOT DETECTED Final   Coronavirus 229E NOT DETECTED NOT DETECTED Final    Comment: (NOTE) The Coronavirus on the Respiratory Panel, DOES NOT test for the novel  Coronavirus (2019 nCoV)    Coronavirus HKU1 NOT DETECTED NOT DETECTED Final   Coronavirus NL63 NOT DETECTED NOT DETECTED Final   Coronavirus OC43 NOT DETECTED NOT DETECTED Final   Metapneumovirus NOT DETECTED NOT DETECTED Final   Rhinovirus / Enterovirus NOT DETECTED NOT DETECTED Final   Influenza A NOT DETECTED NOT DETECTED Final   Influenza B NOT DETECTED NOT DETECTED Final   Parainfluenza Virus 1 NOT DETECTED NOT DETECTED Final   Parainfluenza Virus 2 NOT DETECTED NOT DETECTED Final   Parainfluenza Virus 3 NOT DETECTED NOT DETECTED Final   Parainfluenza Virus 4 NOT DETECTED NOT DETECTED Final   Respiratory Syncytial Virus NOT DETECTED NOT DETECTED Final   Bordetella pertussis NOT DETECTED NOT DETECTED Final   Bordetella Parapertussis NOT DETECTED NOT DETECTED  Final   Chlamydophila pneumoniae NOT DETECTED NOT DETECTED Final   Mycoplasma pneumoniae NOT DETECTED NOT DETECTED Final    Comment: Performed at [facility], [address], [place], [place] [address]  MRSA Next Gen by PCR, Nasal     Status: None   Collection Time: 05/29/24  3:50 PM   Specimen: Nasal Mucosa; Nasal Swab  Result Value Ref Range Status   MRSA by PCR Next Gen NOT  DETECTED NOT DETECTED Final    Comment: (NOTE) The GeneXpert MRSA Assay (FDA approved for NASAL specimens only), is one component of a comprehensive MRSA colonization surveillance program. It is not intended to diagnose MRSA infection nor to guide or monitor treatment for MRSA infections. Test performance is not FDA approved in patients less than 86 years old. Performed at [facility], [address] [place], [place] [address]     Coagulation Studies: Recent Labs    05/29/24 1256 05/30/24 0338 05/31/24 0459  LABPROT 59.0* 69.1* 47.9*  INR 6.4* 7.9* 4.9*     Urinalysis: No results for input(s): COLORURINE, LABSPEC, PHURINE, GLUCOSEU, HGBUR, BILIRUBINUR, KETONESUR, PROTEINUR, UROBILINOGEN, NITRITE, LEUKOCYTESUR in the last 72 hours.  Invalid input(s): APPERANCEUR     Imaging: US  ABDOMINAL PELVIC ART/VENT FLOW DOPPLER LIMITED Result Date: 05/30/2024 CLINICAL DATA:  History of portal vein thrombosis EXAM: LIMITED DUPLEX ULTRASOUND OF LIVER TECHNIQUE: Color and duplex Doppler ultrasound was performed to evaluate the hepatic in-flow and out-flow vessels. COMPARISON:  Ultrasound 05/17/2024, CT 05/29/2024 FINDINGS: Liver: Cirrhotic morphology of the liver. No focal lesion, mass or intrahepatic biliary ductal dilatation. Main Portal Vein size: 0.9 cm Portal Vein Velocities Main Prox:  19.5 cm/sec Main Mid: 28.9 cm/sec Main Dist:  34.1 cm/sec Right: 32.7 cm/sec Left: 20 cm/sec Hepatic Vein Velocities Not evaluated IVC: Not evaluated Hepatic Artery Velocity: Not evaluated  Splenic Vein Velocity: Not a by Portal Vein Occlusion/Thrombus: No Splenic Vein Occlusion/Thrombus: Not assessed Ascites: Moderate to large volume of ascites within the abdomen and pelvis. Hepatopetal flow within the imaged portal veins. IMPRESSION: 1. Cirrhotic morphology of the liver. Moderate to large volume of ascites. 2. Patent portal veins with hepatopetal flow. Previously noted portal vein thrombus has resolved Electronically Signed   By: Luke Bun M.D.   On: 05/30/2024 20:00   CT CHEST ABDOMEN PELVIS WO CONTRAST Result Date: 05/29/2024 CLINICAL DATA:  Septic shock without clear source new end-stage renal disease EXAM: CT CHEST, ABDOMEN AND PELVIS WITHOUT CONTRAST TECHNIQUE: Multidetector CT imaging of the chest, abdomen and pelvis was performed following the standard protocol without IV contrast. RADIATION DOSE REDUCTION: This exam was performed according to the departmental dose-optimization program which includes automated exposure control, adjustment of the mA and/or kV according to patient size and/or use of iterative reconstruction technique. COMPARISON:  Ultrasound 05/17/2024, CT 05/15/2024, chest x-ray 05/29/2024, chest CT 11/08/2023 FINDINGS: CT CHEST FINDINGS Cardiovascular: Limited without intravenous contrast. Moderate aortic atherosclerosis. 4.1 cm diameter ascending aorta. Multi-vessel coronary vascular calcification. No sizable pericardial effusion. Left-sided IJ central venous catheter with tip cough in the SVC. Right sided dialysis catheter with tips at the SVC and cavoatrial junction. Mediastinum/Nodes: Patent trachea. No thyroid mass. No suspicious lymph nodes. Esophagus within normal limits Lungs/Pleura: Mild emphysema. Small left greater than right pleural effusions. Dependent atelectasis in the left lower lobe. Patchy foci of ground-glass disease in the upper lobes and right middle lobe. No pneumothorax. Musculoskeletal: Sternum appears intact. No acute osseous abnormality. CT  ABDOMEN PELVIS FINDINGS Hepatobiliary: Liver cirrhosis. Distended gallbladder with small stones. No biliary dilatation Pancreas: Unremarkable. No pancreatic ductal dilatation or surrounding inflammatory changes. Spleen: Normal in size without focal abnormality. Adrenals/Urinary Tract: Adrenal glands are normal. No hydronephrosis. Several slightly dense exophytic lesions left kidney, largest off the superior pole and measuring 2.6 cm. The bladder is slightly thick walled but decompressed Stomach/Bowel: The stomach is within normal limits. Few mildly dilated loops of jejunum in the left upper quadrant but without obstruction. No definitive acute bowel  wall thickening. Scattered diverticular disease of the colon Vascular/Lymphatic: Advanced aortic atherosclerosis. No definitive aneurysm. No suspicious lymph nodes. Postsurgical changes at the left groin. Reproductive: Prostate seeds Other: Negative for free air. Moderate ascites in the abdomen and pelvis but overall decreased compared to the CT from September. Musculoskeletal: No acute or suspicious osseous abnormality. IMPRESSION: 1. Small left greater than right pleural effusions with dependent atelectasis in the left lower lobe. Patchy foci of ground-glass disease in the upper lobes and right middle lobe, suspect infectious or inflammatory. 2. Liver cirrhosis. Moderate ascites in the abdomen and pelvis but overall decreased compared to the CT from mid September. 3. Distended gallbladder with small stones. 4. Several slightly dense exophytic lesions left kidney, largest off the superior pole and measuring 2.6 cm. These are indeterminate in the absence of contrast. When the patient is clinically stable and able to follow directions and hold their breath (preferably as an outpatient) further evaluation with dedicated abdominal MRI should be considered. 5. Aortic atherosclerosis. Maximum ascending aortic diameter of 4.1 cm. Recommend annual imaging followup by CTA or  MRA. This recommendation follows 2010 ACCF/AHA/AATS/ACR/ASA/SCA/SCAI/SIR/STS/SVM Guidelines for the Diagnosis and Management of Patients with Thoracic Aortic Disease. Circulation. 2010; 121: Z733-z630. Aortic aneurysm NOS (ICD10-I71.9) Aortic Atherosclerosis (ICD10-I70.0). Electronically Signed   By: Luke Bun M.D.   On: 05/29/2024 18:26   DG Chest Port 1 View Result Date: 05/29/2024 CLINICAL DATA:  Central line EXAM: PORTABLE CHEST 1 VIEW COMPARISON:  05/29/2024 FINDINGS: Right-sided central venous catheter tips at the right atrial level. Left IJ central venous catheter tip at the proximal right atrium. Cardiomegaly with vascular congestion. Possible small left effusion. Increasing airspace disease left base. Aortic atherosclerosis. No definitive pneumothorax. IMPRESSION: 1. Right-sided central venous catheter tips at the atrial level. Left IJ central venous catheter tip at the proximal right atrium. No definitive pneumothorax. 2. Cardiomegaly with vascular congestion. Increasing airspace disease at the left base which may be due to atelectasis or pneumonia. Small left effusion Electronically Signed   By: Luke Bun M.D.   On: 05/29/2024 17:35   DG Chest Portable 1 View Result Date: 05/29/2024 CLINICAL DATA:  Weakness.  Hypotensive. EXAM: PORTABLE CHEST 1 VIEW COMPARISON:  05/15/2024. FINDINGS: The heart size and mediastinal contours are within normal limits. Status post placement of right IJ double-lumen hemodialysis catheter with tips overlying the lower SVC and superior cavoatrial junction. Aortic atherosclerosis. Small left pleural effusion, similar to the prior exam. No overt pulmonary edema, focal consolidation, or pneumothorax. No acute osseous abnormality. IMPRESSION: 1. Small left pleural effusion, similar to the prior exam. 2. Interval placement of right IJ double-lumen hemodialysis catheter. Electronically Signed   By: Harrietta Sherry M.D.   On: 05/29/2024 13:50       Medications:     albumin  human     norepinephrine  (LEVOPHED ) Adult infusion 26 mcg/min (05/31/24 1053)   phytonadione  (VITAMIN K ) 10 mg in dextrose  5 % 50 mL IVPB     piperacillin -tazobactam (ZOSYN )  IV Stopped (05/31/24 0534)   prismasol BGK 4/2.5     prismasol BGK 4/2.5     prismasol BGK 4/2.5     vasopressin  0.04 Units/min (05/31/24 1053)     Chlorhexidine  Gluconate Cloth  6 each Topical Daily   epoetin  alfa-epbx (RETACRIT ) injection  10,000 Units Intravenous Q T,Th,Sat-1800   hydrocortisone sod succinate (SOLU-CORTEF) inj  50 mg Intravenous Q6H   pantoprazole  (PROTONIX ) IV  40 mg Intravenous Q12H   alteplase , alteplase , docusate sodium , heparin , heparin , heparin , polyethylene  glycol  Assessment/ Plan:  [name] is a 68 y.o.  male  with past medical conditions including hypertension, COPD, obstructive sleep apnea with CPAP, anemia, and chronic kidney disease stage IV, who was admitted to [facility] on 05/29/2024 for Septic shock (HCC) [A41.9, R65.21]  [facility]/TTS/RT permcath  Acute kidney injury on chronic kidney disease stage IV. Baseline creatinine 2.77 with GFR 24 06/30/2024. CT abd/pelvis shows dense lesion on left kidney. Patient received hemodialysis yesterday, tolerated failure.  Did not require norepinephrine  increased.  Patient show poor tolerance to traditional dialysis without fluid removal.  Patient remains very fluid overloaded and will benefit from CRRT.  Primary team agrees and orders have been placed.     Lab Results  Component Value Date   CREATININE 5.37 (H) 05/31/2024   CREATININE 6.89 (H) 05/30/2024   CREATININE 6.72 (H) 05/29/2024    Intake/Output Summary (Last 24 hours) at 05/31/2024 1205 Last data filed at 05/31/2024 1053 Gross per 24 hour  Intake 1640.16 ml  Output 0 ml  Net 1640.16 ml   2. Hypotension likely due to shock secondary to liver cirrhosis.  Failed attempts to wean norepinephrine  yesterday.  Patient remains on norepinephrine  and  vasopressin  at this time.  3. Anemia of chronic kidney disease Lab Results  Component Value Date   HGB 6.9 (L) 05/31/2024  Hemoglobin remains decreased with dark, tarry stools reported overnight.  GI consulted.  Patient has received multiple blood products during this admission.  Continue Retacrit  10000 units.  Due to CRRT, will change Retacrit  to subcu injections.  4.  Secondary Hyperparathyroidism: with outpatient labs: PTH 52.7, phosphorus 4.1, calcium  8.6 on 7/2.  Lab Results  Component Value Date   CALCIUM  8.0 (L) 05/31/2024   CAION 1.21 08/08/2023   PHOS 4.0 05/31/2024   Calcium  and phosphorus acceptable.   LOS: 2 Sharol Croghan 10/3/202512:05 PM

## 2024-05-31 NOTE — Progress Notes (Signed)
 This RT checked pt's cpap machine,the mask and humidification chamber are dirty. I would not advised pt. To wear unless they have been clean.

## 2024-05-31 NOTE — Progress Notes (Signed)
 Rogelia Copping, MD [facility]   [address], [address] [place], [place] [address] Phone: [phone] Fax : [phone]   Subjective: This patient was evaluated by Dr. Marty yesterday with black stools and a drop in hemoglobin.  The patient has received FFP yesterday and hemoglobin this morning 6.9.  The patient's INR continues to be elevated at 4.9.  Patient denies any abdominal pain at this time.  He has a history of alcohol use but states that he has not drank in 3 years.  The patient also has a history of having a small bowel enteroscopy with a clean-based duodenal ulcer seen in the second portion of duodenum previously.   Objective: Vital signs in last 24 hours: Vitals:   05/31/24 0630 05/31/24 0645 05/31/24 0700 05/31/24 0715  BP: (!) 103/44 (!) 105/41 (!) 112/49 (!) 113/47  Pulse: 60 (!) 59 60 62  Resp: 14 13 14 15   Temp:      TempSrc:      SpO2: 96% 97% 95% 96%  Weight:      Height:       Weight change: 7.1 kg  Intake/Output Summary (Last 24 hours) at 05/31/2024 0851 Last data filed at 05/31/2024 0400 Gross per 24 hour  Intake 1085.34 ml  Output 0 ml  Net 1085.34 ml     Exam: General: Patient laying in bed in no apparent distress alert and oriented.   Lab Results: @LABTEST2 @ Micro Results: Recent Results (from the past 240 hours)  Resp panel by RT-PCR (RSV, Flu A&B, Covid) Anterior Nasal Swab     Status: None   Collection Time: 05/29/24 12:56 PM   Specimen: Anterior Nasal Swab  Result Value Ref Range Status   SARS Coronavirus 2 by RT PCR NEGATIVE NEGATIVE Final    Comment: (NOTE) SARS-CoV-2 target nucleic acids are NOT DETECTED.  The SARS-CoV-2 RNA is generally detectable in upper respiratory specimens during the acute phase of infection. The lowest concentration of SARS-CoV-2 viral copies this assay can detect is 138 copies/mL. A negative result does not preclude SARS-Cov-2 infection and should not be used as the sole basis for treatment or other patient  management decisions. A negative result may occur with  improper specimen collection/handling, submission of specimen other than nasopharyngeal swab, presence of viral mutation(s) within the areas targeted by this assay, and inadequate number of viral copies(<138 copies/mL). A negative result must be combined with clinical observations, patient history, and epidemiological information. The expected result is Negative.  Fact Sheet for Patients:  BloggerCourse.com  Fact Sheet for Healthcare Providers:  SeriousBroker.it  This test is no t yet approved or cleared by the [place]  FDA and  has been authorized for detection and/or diagnosis of SARS-CoV-2 by FDA under an Emergency Use Authorization (EUA). This EUA will remain  in effect (meaning this test can be used) for the duration of the COVID-19 declaration under Section 564(b)(1) of the Act, 21 U.S.C.section 360bbb-3(b)(1), unless the authorization is terminated  or revoked sooner.       Influenza A by PCR NEGATIVE NEGATIVE Final   Influenza B by PCR NEGATIVE NEGATIVE Final    Comment: (NOTE) The Xpert Xpress SARS-CoV-2/FLU/RSV plus assay is intended as an aid in the diagnosis of influenza from Nasopharyngeal swab specimens and should not be used as a sole basis for treatment. Nasal washings and aspirates are unacceptable for Xpert Xpress SARS-CoV-2/FLU/RSV testing.  Fact Sheet for Patients: BloggerCourse.com  Fact Sheet for Healthcare Providers: SeriousBroker.it  This test is not  yet approved or cleared by the [place]  FDA and has been authorized for detection and/or diagnosis of SARS-CoV-2 by FDA under an Emergency Use Authorization (EUA). This EUA will remain in effect (meaning this test can be used) for the duration of the COVID-19 declaration under Section 564(b)(1) of the Act, 21 U.S.C. section 360bbb-3(b)(1),  unless the authorization is terminated or revoked.     Resp Syncytial Virus by PCR NEGATIVE NEGATIVE Final    Comment: (NOTE) Fact Sheet for Patients: BloggerCourse.com  Fact Sheet for Healthcare Providers: SeriousBroker.it  This test is not yet approved or cleared by the [place]  FDA and has been authorized for detection and/or diagnosis of SARS-CoV-2 by FDA under an Emergency Use Authorization (EUA). This EUA will remain in effect (meaning this test can be used) for the duration of the COVID-19 declaration under Section 564(b)(1) of the Act, 21 U.S.C. section 360bbb-3(b)(1), unless the authorization is terminated or revoked.  Performed at [facility], [address] [place], [place] [address]   Blood culture (routine x 2)     Status: None (Preliminary result)   Collection Time: 05/29/24 12:56 PM   Specimen: BLOOD  Result Value Ref Range Status   Specimen Description BLOOD RIGHT ANTECUBITAL  Final   Special Requests   Final    BOTTLES DRAWN AEROBIC AND ANAEROBIC Blood Culture results may not be optimal due to an inadequate volume of blood received in culture bottles   Culture   Final    NO GROWTH 2 DAYS Performed at [facility], [address] [place], [place] [address]    Report Status PENDING  Incomplete  Blood culture (routine x 2)     Status: None (Preliminary result)   Collection Time: 05/29/24 12:56 PM   Specimen: BLOOD  Result Value Ref Range Status   Specimen Description BLOOD LEFT ANTECUBITAL  Final   Special Requests   Final    BOTTLES DRAWN AEROBIC AND ANAEROBIC Blood Culture results may not be optimal due to an inadequate volume of blood received in culture bottles   Culture   Final    NO GROWTH 2 DAYS Performed at [facility], [address], [place], [place] [address]    Report Status PENDING  Incomplete  MRSA Next Gen by PCR, Nasal     Status: None   Collection  Time: 05/29/24  2:58 PM   Specimen: Nasal Mucosa; Nasal Swab  Result Value Ref Range Status   MRSA by PCR Next Gen NOT DETECTED NOT DETECTED Final    Comment: (NOTE) The GeneXpert MRSA Assay (FDA approved for NASAL specimens only), is one component of a comprehensive MRSA colonization surveillance program. It is not intended to diagnose MRSA infection nor to guide or monitor treatment for MRSA infections. Test performance is not FDA approved in patients less than 3 years old. Performed at [facility], [address] [place], [place] [address]   Group A Strep by PCR     Status: None   Collection Time: 05/29/24  2:58 PM   Specimen: Throat; Sterile Swab  Result Value Ref Range Status   Group A Strep by PCR NOT DETECTED NOT DETECTED Final    Comment: Performed at [facility], [address] [place], [place] [address]  Respiratory (~20 pathogens) panel by PCR     Status: None   Collection Time: 05/29/24  3:00 PM  Result Value Ref Range Status   Adenovirus NOT DETECTED NOT DETECTED Final   Coronavirus  229E NOT DETECTED NOT DETECTED Final    Comment: (NOTE) The Coronavirus on the Respiratory Panel, DOES NOT test for the novel  Coronavirus (2019 nCoV)    Coronavirus HKU1 NOT DETECTED NOT DETECTED Final   Coronavirus NL63 NOT DETECTED NOT DETECTED Final   Coronavirus OC43 NOT DETECTED NOT DETECTED Final   Metapneumovirus NOT DETECTED NOT DETECTED Final   Rhinovirus / Enterovirus NOT DETECTED NOT DETECTED Final   Influenza A NOT DETECTED NOT DETECTED Final   Influenza B NOT DETECTED NOT DETECTED Final   Parainfluenza Virus 1 NOT DETECTED NOT DETECTED Final   Parainfluenza Virus 2 NOT DETECTED NOT DETECTED Final   Parainfluenza Virus 3 NOT DETECTED NOT DETECTED Final   Parainfluenza Virus 4 NOT DETECTED NOT DETECTED Final   Respiratory Syncytial Virus NOT DETECTED NOT DETECTED Final   Bordetella pertussis NOT DETECTED NOT DETECTED Final   Bordetella  Parapertussis NOT DETECTED NOT DETECTED Final   Chlamydophila pneumoniae NOT DETECTED NOT DETECTED Final   Mycoplasma pneumoniae NOT DETECTED NOT DETECTED Final    Comment: Performed at [facility], [address], [place], [place] [address]  MRSA Next Gen by PCR, Nasal     Status: None   Collection Time: 05/29/24  3:50 PM   Specimen: Nasal Mucosa; Nasal Swab  Result Value Ref Range Status   MRSA by PCR Next Gen NOT DETECTED NOT DETECTED Final    Comment: (NOTE) The GeneXpert MRSA Assay (FDA approved for NASAL specimens only), is one component of a comprehensive MRSA colonization surveillance program. It is not intended to diagnose MRSA infection nor to guide or monitor treatment for MRSA infections. Test performance is not FDA approved in patients less than 84 years old. Performed at [facility], [address] [place], [place] [address]    Studies/Results: US  ABDOMINAL PELVIC ART/VENT FLOW DOPPLER LIMITED Result Date: 05/30/2024 CLINICAL DATA:  History of portal vein thrombosis EXAM: LIMITED DUPLEX ULTRASOUND OF LIVER TECHNIQUE: Color and duplex Doppler ultrasound was performed to evaluate the hepatic in-flow and out-flow vessels. COMPARISON:  Ultrasound 05/17/2024, CT 05/29/2024 FINDINGS: Liver: Cirrhotic morphology of the liver. No focal lesion, mass or intrahepatic biliary ductal dilatation. Main Portal Vein size: 0.9 cm Portal Vein Velocities Main Prox:  19.5 cm/sec Main Mid: 28.9 cm/sec Main Dist:  34.1 cm/sec Right: 32.7 cm/sec Left: 20 cm/sec Hepatic Vein Velocities Not evaluated IVC: Not evaluated Hepatic Artery Velocity: Not evaluated Splenic Vein Velocity: Not a by Portal Vein Occlusion/Thrombus: No Splenic Vein Occlusion/Thrombus: Not assessed Ascites: Moderate to large volume of ascites within the abdomen and pelvis. Hepatopetal flow within the imaged portal veins. IMPRESSION: 1. Cirrhotic morphology of the liver. Moderate to large volume of ascites. 2. Patent  portal veins with hepatopetal flow. Previously noted portal vein thrombus has resolved Electronically Signed   By: Luke Bun M.D.   On: 05/30/2024 20:00   CT CHEST ABDOMEN PELVIS WO CONTRAST Result Date: 05/29/2024 CLINICAL DATA:  Septic shock without clear source new end-stage renal disease EXAM: CT CHEST, ABDOMEN AND PELVIS WITHOUT CONTRAST TECHNIQUE: Multidetector CT imaging of the chest, abdomen and pelvis was performed following the standard protocol without IV contrast. RADIATION DOSE REDUCTION: This exam was performed according to the departmental dose-optimization program which includes automated exposure control, adjustment of the mA and/or kV according to patient size and/or use of iterative reconstruction technique. COMPARISON:  Ultrasound 05/17/2024, CT 05/15/2024, chest x-ray 05/29/2024, chest CT 11/08/2023 FINDINGS: CT CHEST FINDINGS Cardiovascular: Limited without intravenous contrast. Moderate aortic atherosclerosis.  4.1 cm diameter ascending aorta. Multi-vessel coronary vascular calcification. No sizable pericardial effusion. Left-sided IJ central venous catheter with tip cough in the SVC. Right sided dialysis catheter with tips at the SVC and cavoatrial junction. Mediastinum/Nodes: Patent trachea. No thyroid mass. No suspicious lymph nodes. Esophagus within normal limits Lungs/Pleura: Mild emphysema. Small left greater than right pleural effusions. Dependent atelectasis in the left lower lobe. Patchy foci of ground-glass disease in the upper lobes and right middle lobe. No pneumothorax. Musculoskeletal: Sternum appears intact. No acute osseous abnormality. CT ABDOMEN PELVIS FINDINGS Hepatobiliary: Liver cirrhosis. Distended gallbladder with small stones. No biliary dilatation Pancreas: Unremarkable. No pancreatic ductal dilatation or surrounding inflammatory changes. Spleen: Normal in size without focal abnormality. Adrenals/Urinary Tract: Adrenal glands are normal. No hydronephrosis.  Several slightly dense exophytic lesions left kidney, largest off the superior pole and measuring 2.6 cm. The bladder is slightly thick walled but decompressed Stomach/Bowel: The stomach is within normal limits. Few mildly dilated loops of jejunum in the left upper quadrant but without obstruction. No definitive acute bowel wall thickening. Scattered diverticular disease of the colon Vascular/Lymphatic: Advanced aortic atherosclerosis. No definitive aneurysm. No suspicious lymph nodes. Postsurgical changes at the left groin. Reproductive: Prostate seeds Other: Negative for free air. Moderate ascites in the abdomen and pelvis but overall decreased compared to the CT from September. Musculoskeletal: No acute or suspicious osseous abnormality. IMPRESSION: 1. Small left greater than right pleural effusions with dependent atelectasis in the left lower lobe. Patchy foci of ground-glass disease in the upper lobes and right middle lobe, suspect infectious or inflammatory. 2. Liver cirrhosis. Moderate ascites in the abdomen and pelvis but overall decreased compared to the CT from mid September. 3. Distended gallbladder with small stones. 4. Several slightly dense exophytic lesions left kidney, largest off the superior pole and measuring 2.6 cm. These are indeterminate in the absence of contrast. When the patient is clinically stable and able to follow directions and hold their breath (preferably as an outpatient) further evaluation with dedicated abdominal MRI should be considered. 5. Aortic atherosclerosis. Maximum ascending aortic diameter of 4.1 cm. Recommend annual imaging followup by CTA or MRA. This recommendation follows 2010 ACCF/AHA/AATS/ACR/ASA/SCA/SCAI/SIR/STS/SVM Guidelines for the Diagnosis and Management of Patients with Thoracic Aortic Disease. Circulation. 2010; 121: Z733-z630. Aortic aneurysm NOS (ICD10-I71.9) Aortic Atherosclerosis (ICD10-I70.0). Electronically Signed   By: Luke Bun M.D.   On:  05/29/2024 18:26   DG Chest Port 1 View Result Date: 05/29/2024 CLINICAL DATA:  Central line EXAM: PORTABLE CHEST 1 VIEW COMPARISON:  05/29/2024 FINDINGS: Right-sided central venous catheter tips at the right atrial level. Left IJ central venous catheter tip at the proximal right atrium. Cardiomegaly with vascular congestion. Possible small left effusion. Increasing airspace disease left base. Aortic atherosclerosis. No definitive pneumothorax. IMPRESSION: 1. Right-sided central venous catheter tips at the atrial level. Left IJ central venous catheter tip at the proximal right atrium. No definitive pneumothorax. 2. Cardiomegaly with vascular congestion. Increasing airspace disease at the left base which may be due to atelectasis or pneumonia. Small left effusion Electronically Signed   By: Luke Bun M.D.   On: 05/29/2024 17:35   DG Chest Portable 1 View Result Date: 05/29/2024 CLINICAL DATA:  Weakness.  Hypotensive. EXAM: PORTABLE CHEST 1 VIEW COMPARISON:  05/15/2024. FINDINGS: The heart size and mediastinal contours are within normal limits. Status post placement of right IJ double-lumen hemodialysis catheter with tips overlying the lower SVC and superior cavoatrial junction. Aortic atherosclerosis. Small left pleural effusion, similar to the prior exam.  No overt pulmonary edema, focal consolidation, or pneumothorax. No acute osseous abnormality. IMPRESSION: 1. Small left pleural effusion, similar to the prior exam. 2. Interval placement of right IJ double-lumen hemodialysis catheter. Electronically Signed   By: Harrietta Sherry M.D.   On: 05/29/2024 13:50   Medications: I have reviewed the patient's current medications. Scheduled Meds:  sodium chloride    Intravenous Once   Chlorhexidine  Gluconate Cloth  6 each Topical Daily   epoetin  alfa-epbx (RETACRIT ) injection  10,000 Units Intravenous Q T,Th,Sat-1800   hydrocortisone sod succinate (SOLU-CORTEF) inj  50 mg Intravenous Q6H   pantoprazole   (PROTONIX ) IV  40 mg Intravenous Q12H   Continuous Infusions:  norepinephrine  (LEVOPHED ) Adult infusion 26 mcg/min (05/31/24 0612)   piperacillin -tazobactam (ZOSYN )  IV 2.25 g (05/31/24 0504)   vasopressin  0.04 Units/min (05/31/24 0531)   PRN Meds:.alteplase , alteplase , docusate sodium , heparin , heparin , polyethylene glycol   Assessment: Principal Problem:   Septic shock (HCC)    Plan: As per Dr. Williemae note the drop in hemoglobin and melena likely due to GI bleed secondary to supratherapeutic INR.  He also has a longstanding history as noted of iron  deficiency anemia.  It was recommended that when the INR goes below 1.5 we can consider EGD and colonoscopy.  Continue to transfuse as needed.  Dr. Unk will be covering the service this weekend.   LOS: 2 days   Rogelia Copping, MD.FACG 05/31/2024, 8:51 AM Pager [phone] 7am-5pm  Check AMION for 5pm -7am coverage and on weekends

## 2024-05-31 NOTE — Progress Notes (Signed)
 Initial Nutrition Assessment  DOCUMENTATION CODES:   Obesity unspecified  INTERVENTION:   Nepro Shake po TID, each supplement provides 425 kcal and 19 grams protein  Magic cup TID with meals, each supplement provides 290 kcal and 9 grams of protein  Rena-vite po daily   Vitamin C  500mg  po BID   Thiamine  100mg  po daily x 7 days  Pt at high refeed risk; recommend monitor potassium, magnesium  and phosphorus labs daily until stable  Daily weights   NUTRITION DIAGNOSIS:   Increased nutrient needs related to chronic illness (ESRD on HD) as evidenced by estimated needs.  GOAL:   Patient will meet greater than or equal to 90% of their needs  MONITOR:   PO intake, Supplement acceptance, Labs, Weight trends, I & O's, Skin  REASON FOR ASSESSMENT:   Consult Assessment of nutrition requirement/status  ASSESSMENT:   68 y/o male with h/o COPD, PAD, CAD, etoh abuse, cirrhosis, OSA, diverticulosis, hemorrhoids, IDA, HLD, CHF, PAF, PUD (with duodenal ulcer and angiodysplasia), prostate cancer, gout and recent admission for CHF, shock, bradycardia and AKI with progression to ESRD s/p new HD 9/18 and who is now admitted with weakness and congestion and was found to have CHF, sepsis, shock and melena.  Met with pt in room today. Pt is well known to this RD from previous admissions. Pt reports fair appetite and oral intake pta and in hospital. Pt reports that he ate 100% of his oatmeal for breakfast this morning. Pt is unsure if he has been taking his rena-vit at home; pt reports that his daughter has been managing his medications. RD discussed with pt the importance of adequate nutrition needed to preserve lean muscle, replace losses from HD and to support wound healing. Pt is agreeable to drink chocolate Ensure but is willing to try the mixed berry or butter pecan Nepro. RD will add vitamins to support wound healing. Pt also noted to have ecchymosis. Per chart, pt is down ~26lbs(10%) since  January. Pt is up ~16lbs from his last admission. Pt +7.0L on his I & Os. Pt is noted to have significant edema in his BUE. Plan is for CRRT today. Pt continues to have small amounts of melena.      Medications reviewed and include: epoetin , solu-cortef, protonix , albumin , levophed  vitamin K , zosyn , vasopressin    Labs reviewed: Na 133(L), K 3.9 wnl, BUN 60(H), creat 5.37(H), P 4.0 wnl, Mg 2.1 wnl Wbc- 27.4(H), Hgb 6.9(L), Hct 21.0(L) Iron  60, TIBC 245(L), ferritin 209- 8/18  NUTRITION - FOCUSED PHYSICAL EXAM:  Flowsheet Row Most Recent Value  Orbital Region No depletion  Upper Arm Region No depletion  Thoracic and Lumbar Region No depletion  Buccal Region No depletion  Temple Region No depletion  Clavicle Bone Region Mild depletion  Clavicle and Acromion Bone Region Mild depletion  Scapular Bone Region No depletion  Dorsal Hand No depletion  Patellar Region No depletion  Anterior Thigh Region No depletion  Posterior Calf Region No depletion  Edema (RD Assessment) Moderate  Hair Reviewed  Eyes Reviewed  Mouth Reviewed  Skin Reviewed  Nails Reviewed   Diet Order:   Diet Order             Diet renal with fluid restriction Fluid restriction: 1200 mL Fluid; Room service appropriate? Yes; Fluid consistency: Thin  Diet effective now                  EDUCATION NEEDS:   Education needs have been addressed  Skin:  Skin Assessment: Reviewed RN Assessment (DTI sacrum and coccyx, partial thickness skin loss L arm, L leg wound)  Last BM:  10/3- type 7  Height:   Ht Readings from Last 1 Encounters:  05/29/24 5' 9 (1.753 m)    Weight:   Wt Readings from Last 1 Encounters:  05/31/24 72.6 kg    Ideal Body Weight:  72.7 kg  BMI:  Body mass index is 23.64 kg/m.  Estimated Nutritional Needs:   Kcal:  2300-2600kcal/day  Protein:  115-130g/day  Fluid:  1.9-2.2L/day  Augustin Shams MS, RD, LDN If unable to be reached, please send secure chat to RD inpatient  available from 8:00a-4:00p daily

## 2024-05-31 NOTE — Progress Notes (Signed)
 PHARMACY CONSULT NOTE - ELECTROLYTES  Pharmacy Consult for Electrolyte Monitoring and Replacement   Recent Labs: Height: 5' 9 (175.3 cm) Weight: 72.6 kg (160 lb 0.9 oz) IBW/kg (Calculated) : 70.7 Estimated Creatinine Clearance: 13.5 mL/min (A) (by C-G formula based on SCr of 5.37 mg/dL (H)). Potassium (mmol/L)  Date Value  05/31/2024 3.9   Magnesium  (mg/dL)  Date Value  89/96/7974 2.1   Calcium  (mg/dL)  Date Value  89/96/7974 8.0 (L)   Albumin  (g/dL)  Date Value  89/96/7974 2.1 (L)   Phosphorus (mg/dL)  Date Value  89/96/7974 4.0   Sodium (mmol/L)  Date Value  05/31/2024 133 (L)  10/12/2021 145 (H)    Assessment  Kevin Gross is a 68 y.o. male presenting with septic shock. PMH significant for Portal vein thrombosis 9/17, CHF, ESRD on HD TTS, ETOH disorder, chronic bradycardia. Pharmacy has been consulted to monitor and replace electrolytes.  Diet: renal with fluid restriction  MIVF:  Pertinent medications:   Goal of Therapy: Electrolytes WNL  Plan:  No electrolyte replacement warranted at this time  Check BMP and Mg with AM labs  Thank you for allowing pharmacy to be a part of this patient's care.   Ransom Blanch PGY-1 Pharmacy Resident  [place] [facility]  05/31/2024 7:33 AM

## 2024-05-31 NOTE — Plan of Care (Signed)

## 2024-05-31 NOTE — Progress Notes (Signed)
 NAME:  TU BAYLE, MRN:  [id number], DOB:  1956/07/28, LOS: 2 ADMISSION DATE:  05/29/2024, CONSULTATION DATE:  05/29/2024 REFERRING MD:  Dr. Claudene, CHIEF COMPLAINT:  Generalized Weakness & Hypotension    Brief Pt Description / Synopsis:  68 y.o. male with PMHx significant for ESRD on HD, HFpEF, COPD, OSA, Alcoholic Cirrhosis, and portal vein thrombosis, who is admitted with Multifactorial shock (hypovolemic/hemorrhagic vs septic), Sepsis of unknown etiology (questionable LLE cellulitis vs Pneumonia vs SBP), Acute GI Bleed, and Acute Decompensated Cirrhosis.   History of Present Illness:  Mr. Petitjean is a 68 y.o. male who presented to [facility] ED via EMS from his home to be evaluated for hypotension and weakness. He reports his initial outpatient hemodialysis appointment was 05/28/2024 at [facility]. This initial appointment is following a recent admission at [facility], of which he was discharged on 05/24/24.   His overall weakness began yesterday following his dialysis appointment and has continued today, with the addition of upper URI symptoms to include minor congestion and a sore throat as of this morning. Upon EMS arrival, he was found to have a BP of 64/28, to which he received fluids and BP increased to 94/42. He reported a sore throat and generalized fatigue/weakness that resulted in a fall this morning. He also stated at baseline he has no issue ambulating with a walker.  PCCM asked to admit for further workup and treatment.  Please see Significant Hospital Events section below for full detailed hospital course.   Pertinent  Medical History  ETOH Hypertensin Hyperlipidemia Congestive Heart Failure End Stage Renal Disease  Micro Data:  05/29/24: Blood cultures x2 >> no growth to date  05/29/24: MRSA PCR >> negative 05/29/24: Group A Strep PCR >> negative 05/29/24: COVID/FLU/RSV PCR >> negative 05/29/24: Legionella Urine Antigen >> 05/29/24: Strep Pneumo Urine Antigen >> 05/30/24:  Viral Respiratory Panel >> negative  Antimicrobials:   Anti-infectives (From admission, onward)    Start     Dose/Rate Route Frequency Ordered Stop   05/29/24 2200  piperacillin -tazobactam (ZOSYN ) IVPB 2.25 g        2.25 g 100 mL/hr over 30 Minutes Intravenous Every 8 hours 05/29/24 1752     05/29/24 1400  piperacillin -tazobactam (ZOSYN ) IVPB 3.375 g        3.375 g 100 mL/hr over 30 Minutes Intravenous  Once 05/29/24 1353 05/29/24 1432   05/29/24 1400  vancomycin  (VANCOREADY) IVPB 2000 mg/400 mL        2,000 mg 200 mL/hr over 120 Minutes Intravenous  Once 05/29/24 1353 05/29/24 1600       Significant Hospital Events: Including procedures, antibiotic start and stop dates in addition to other pertinent events   05/29/24: Started on Levophed  in the ED following refractory hypotension. Midodrine  10mg  x 1. 1.5L fluids given without improvement. Transferred to the ICU for further management. 2L [place] started with improvement in O2.  05/30/24: Remains on levophed  and started on vaso overnight. Minimal oxygen requirements. Oliguric with plan for HD today at 11.  Abdominal Arterial Ultrasound with patent portal vein and hepatopetal flow (PORTAL VEIN THROMBOSIS RESOLVED), notes cirrhosis and moderate to large ascites.  05/31/24: No significant events overnight, nursing noted several small episodes of melena.  Hgb down to 6.9 this am, giving 1 unit pRBCs.  Remains on Levophed  and Vasopressin , start stress dose steroids and give Albumin .  Ultrasound yesterday negative for portal vein thrombosis, will give Vitamin K  for supratherapeutic INR.  Nephrology starting CRRT.   Interim History / Subjective:  As outlined above under Significant Hospital Events section  Objective   Blood pressure (!) 113/47, pulse 62, temperature 98.4 F (36.9 C), temperature source Oral, resp. rate 15, height 5' 9 (1.753 m), weight 72.6 kg, SpO2 96%. CVP:  [0 mmHg-24 mmHg] 4 mmHg  FiO2 (%):  [21 %] 21 %   Intake/Output  Summary (Last 24 hours) at 05/31/2024 0830 Last data filed at 05/31/2024 0400 Gross per 24 hour  Intake 1085.34 ml  Output 0 ml  Net 1085.34 ml   Filed Weights   05/30/24 0500 05/30/24 1437 05/31/24 0500  Weight: 116.7 kg 120.3 kg 72.6 kg    Examination: General: ill appearing but better today, no acute distress, resting in bed HENT: moist mucous membranes, anicteric, atraumatic, supple, no JVD Lungs: clear and equal breath sounds bilaterally, no wheezing/rales/rhonchi, non-labored Cardiovascular: sinus bradycardia, S1 S2, no m/r/g Abdomen: soft, nontender, bowel sounds x 4 Extremities: warm/dry, small erythematous blister of LLE wrapped, sporadic bruising of bilateral upper extremities, 3+ pretibial edema, 2+ radial pulses / 1+pedal pulses Neuro: A&O x 4, follows commands, no focal neuro deficits, PERRL GU: deferred  Resolved Hospital Problem list     Assessment & Plan:   #Shock: Hypovolemic/Hemorrhagic vs Septic exacerbated by Decompensated Liver Cirrhosis  #HFpEF without acute exacerbation  #Mildly Elevated Troponin due to demand ischemia  PMHx: Bradycardia, HTN, HLD, Paroxsymal A. Fib, prolonged QT -Echocardiogram 05/16/24: LVEF 60-65%, mild LVH, normal diastolic parameters, RV systolic function normal, RV size normal, normal pulmonary artery systolic pressure  -Continuous cardiac monitoring -Maintain MAP >65 -Cautious IV fluids -Transfusions as indicated   -Vasopressors as needed to maintain MAP goal -Trend lactic acid until normalized -HS Troponin peaked at 101 -[place] 75.9  -Volume removal with renal replacement therapy  #Sepsis of Unknown Etiology #Questionable Cellulitis of LLE vs Pneumonia vs Spontaneous Bacterial Peritonitis  -Monitor fever curve -Trend WBC's & Procalcitonin -Follow cultures as above -Continue empiric Zosyn  pending cultures & sensitivities -Once INR stabilized, would consider Thoracentesis to rule out SBP   #COPD #OSA on CPAP #Stable left  Pleural Effusion -Supplemental O2 as needed to maintain O2 sats 88 to 92% -BiPAP if needed  -Follow intermittent Chest X-ray & ABG as needed -Bronchodilators prn -ABX as above -Volume removal with renal replacement therapy  -Pulmonary toilet as able  #ESRD on Hemodialysis  #Anion Gap Metabolic Acidosis  -Monitor I&O's / urinary output -Follow BMP -Ensure adequate renal perfusion -Avoid nephrotoxic agents as able -Replace electrolytes as indicated ~ Pharmacy following for assistance with electrolyte replacement -Nephrology following, appreciate input ~ HD vs CRRT as per Nephrology   #Suspected Acute GI Bleed superimposed on Chronic Iron  Deficiency Anemia #Thrombocytopenia, suspect due to cirrhosis  #Supratherapeutic INR Hx: Iron  Deficiency Anemia -Monitor for S/Sx of bleeding -Trend CBC & Coags -SCD's for VTE Prophylaxis  -Transfuse for Hgb <7 ~ status post 1 unit of pRBCs and 1 FFP ~ plan for 1 unit pRBC on 10/3 -Transfuse Platelets for platelet count <10K; <50K with active bleeding; <100K for Neurosurgical procedures -Will give Vitamin K  10/3  #Alcoholic Liver Cirrhosis #Acute Decompensated Liver Cirrhosis #Transaminitis #Hypoalbuminemia #Portal Vein Thrombosis (Identified during previous admission) ~ RESOLVED  Hx: ETOH abuse -05/29/24 CT Chest/Abdomen/Pelvis: small bilateral pleural effusions, liver cirrhosis, distended gallbladder with small stones, exophytic lesions of left kidney, aortic atherosclerosis. -05/30/24 Abdominal Pelvic Arterial/Venous Flow Doppler: shows resolution of previous portal vein thrombus, also notes cirrhosis and moderate to large volume of ascites  -Monitor LFT's and coags -Continue Protonix  BID -Holding Eliquis  currently  until active GI Bleed ruled out -GI following, appreciate input  -Once INR stabilized, recommend Paracentesis      Best Practice (right click and Reselect all SmartList Selections daily)   Diet/type: Regular consistency  (see orders) DVT prophylaxis: SCD GI prophylaxis: PPI Lines: Central line, Dialysis Catheter, and yes and it is still needed Foley:  Yes, and it is still needed Code Status:  full code Last date of multidisciplinary goals of care discussion [10/3]  10/3: Pt and family updated at bedside on plan of care.  Labs   CBC: Recent Labs  Lab 05/29/24 1256 05/29/24 2040 05/30/24 0338 05/30/24 1400 05/30/24 1800 05/31/24 0026 05/31/24 0459 05/31/24 0638  WBC 21.1* 32.3* 29.9*  --   --   --   --  27.4*  NEUTROABS 18.6* 27.5*  --   --   --   --   --   --   HGB 7.9* 7.7* 6.6* 8.1* 8.0* 7.4* 7.2* 6.9*  HCT 24.9* 24.4* 20.5* 24.6* 24.0* 22.2* 22.1* 21.0*  MCV 106.9* 107.0* 105.1*  --   --   --   --  99.1  PLT 117* 180 163  --   --   --   --  116*    Basic Metabolic Panel: Recent Labs  Lab 05/24/24 1404 05/29/24 1256 05/30/24 0338 05/31/24 0459  NA 135 137 138 133*  K 3.5 3.5 4.0 3.9  CL 98 101 100 98  CO2 24 19* 25 22  GLUCOSE 121* 82 147* 128*  BUN 49* 71* 74* 60*  CREATININE 5.39* 6.72* 6.89* 5.37*  CALCIUM  8.0* 7.9* 7.9* 8.0*  MG  --  2.1 1.7 2.1  PHOS 5.3*  --  4.9* 4.0   GFR: Estimated Creatinine Clearance: 13.5 mL/min (A) (by C-G formula based on SCr of 5.37 mg/dL (H)). Recent Labs  Lab 05/29/24 1256 05/29/24 1458 05/29/24 2040 05/29/24 2349 05/30/24 0337 05/30/24 0338 05/31/24 0638  PROCALCITON 2.62  --   --   --   --   --   --   WBC 21.1*  --  32.3*  --   --  29.9* 27.4*  LATICACIDVEN 5.8* 4.5* 3.2* 3.2* 2.9*  --   --     Liver Function Tests: Recent Labs  Lab 05/24/24 1404 05/29/24 1256 05/30/24 0338 05/31/24 0459  AST  --  301* 270* 250*  ALT  --  90* 91* 94*  ALKPHOS  --  226* 205* 206*  BILITOT  --  3.1* 3.6* 5.4*  PROT  --  4.9* 4.4* 4.6*  ALBUMIN  2.7* 2.3* 2.0* 2.1*   No results for input(s): LIPASE, AMYLASE in the last 168 hours. No results for input(s): AMMONIA in the last 168 hours.  ABG    Component Value Date/Time   PHART  7.298 (L) 11/27/2021 0345   PCO2ART 39.7 11/27/2021 0345   PO2ART 116 (H) 11/27/2021 0345   HCO3 21.9 05/29/2024 2040   TCO2 21 (L) 08/08/2023 0916   ACIDBASEDEF 2.8 (H) 05/29/2024 2040   O2SAT 88 05/30/2024 1038     Coagulation Profile: Recent Labs  Lab 05/29/24 1256 05/30/24 0338 05/31/24 0459  INR 6.4* 7.9* 4.9*    Cardiac Enzymes: No results for input(s): CKTOTAL, CKMB, CKMBINDEX, TROPONINI in the last 168 hours.  HbA1C: Hgb A1c MFr Bld  Date/Time Value Ref Range Status  11/27/2021 03:28 AM 5.6 4.8 - 5.6 % Final    Comment:    (NOTE) Pre diabetes:  5.7%-6.4%  Diabetes:              >6.4%  Glycemic control for   <7.0% adults with diabetes     CBG: Recent Labs  Lab 05/29/24 1540  GLUCAP 113*    Review of Systems:   Positives in BOLD: Currently denies all complaints  Gen: Denies fever, chills, weight change, fatigue, night sweats HEENT: Denies blurred vision, double vision, hearing loss, tinnitus, sinus congestion, rhinorrhea, sore throat, neck stiffness, dysphagia PULM: Denies shortness of breath, cough, sputum production, hemoptysis, wheezing CV: Denies chest pain, edema, orthopnea, paroxysmal nocturnal dyspnea, palpitations GI: Denies abdominal pain, nausea, vomiting, diarrhea, hematochezia, melena, constipation, change in bowel habits GU: Denies dysuria, hematuria, polyuria, oliguria, urethral discharge Endocrine: Denies hot or cold intolerance, polyuria, polyphagia or appetite change Derm: Denies rash, dry skin, scaling or peeling skin change Heme: Denies easy bruising, bleeding, bleeding gums Neuro: Denies headache, numbness, weakness, slurred speech, loss of memory or consciousness   Past Medical History:  He,  has a past medical history of Alcoholic cirrhosis of liver (HCC), Anemia (2024), Cataract (2024), CHF (congestive heart failure) (HCC), Chronic gouty arthritis, CKD (chronic kidney disease), stage III (HCC), Clotting disorder,  Complication of anesthesia, COPD (chronic obstructive pulmonary disease) (HCC), Hyperlipidemia, Hypertension, Hyperuricemia, Peripheral arterial disease, Pre-diabetes, Prostate cancer (HCC) (2023), and Sleep apnea.   Surgical History:   Past Surgical History:  Procedure Laterality Date   ABDOMINAL AORTOGRAM W/LOWER EXTREMITY N/A 10/20/2021   Procedure: ABDOMINAL AORTOGRAM W/LOWER EXTREMITY;  Surgeon: Darron Deatrice LABOR, MD;  Location: [facility];  Service: Cardiovascular;  Laterality: N/A;   ABDOMINAL AORTOGRAM W/LOWER EXTREMITY N/A 08/08/2023   Procedure: ABDOMINAL AORTOGRAM W/LOWER EXTREMITY;  Surgeon: Serene Gaile ORN, MD;  Location: [facility];  Service: Cardiovascular;  Laterality: N/A;   ALLOGRAFT APPLICATION Bilateral 12/22/2021   Procedure: ALLOGRAFT APPLICATION;  Surgeon: Serene Gaile ORN, MD;  Location: [facility] OR;  Service: Vascular;  Laterality: Bilateral;   ANGIOPLASTY Left 11/26/2021   Procedure: LEFT INFRARENAL LITHOTRIPSY;  Surgeon: Serene Gaile ORN, MD;  Location: [facility] OR;  Service: Vascular;  Laterality: Left;   APPLICATION OF WOUND VAC Bilateral 12/13/2021   Procedure: APPLICATION OF WOUND VAC;  Surgeon: Lanis Fonda BRAVO, MD;  Location: [facility] OR;  Service: Vascular;  Laterality: Bilateral;   CARDIAC CATHETERIZATION     [facility]   CATARACT EXTRACTION W/PHACO Left 04/04/2023   Procedure: CATARACT EXTRACTION PHACO AND INTRAOCULAR LENS PLACEMENT (IOC) LEFT 8.71 00:45.5;  Surgeon: Jaye Fallow, MD;  Location: [facility];  Service: Ophthalmology;  Laterality: Left;   CATARACT EXTRACTION W/PHACO Right 04/18/2023   Procedure: CATARACT EXTRACTION PHACO AND INTRAOCULAR LENS PLACEMENT (IOC) RIGHT 4.31 00:8.2;  Surgeon: Jaye Fallow, MD;  Location: [facility];  Service: Ophthalmology;  Laterality: Right;   COLONOSCOPY     COLONOSCOPY WITH PROPOFOL  N/A 01/17/2018   Procedure: COLONOSCOPY WITH PROPOFOL ;  Surgeon: Toledo, Ladell POUR, MD;  Location: [facility]  ENDOSCOPY;  Service: Gastroenterology;  Laterality: N/A;   COLONOSCOPY WITH PROPOFOL  N/A 09/12/2022   Procedure: COLONOSCOPY WITH PROPOFOL ;  Surgeon: Maryruth Ole DASEN, MD;  Location: [facility] ENDOSCOPY;  Service: Endoscopy;  Laterality: N/A;   COLONOSCOPY WITH PROPOFOL  N/A 09/05/2023   Procedure: COLONOSCOPY WITH PROPOFOL ;  Surgeon: Maryruth Ole DASEN, MD;  Location: [facility] ENDOSCOPY;  Service: Endoscopy;  Laterality: N/A;   DIALYSIS/PERMA CATHETER INSERTION Right 05/21/2024   Procedure: DIALYSIS/PERMA CATHETER INSERTION;  Surgeon: Jama Cordella MATSU, MD;  Location: [facility];  Service: Cardiovascular;  Laterality: Right;  ENDARTERECTOMY FEMORAL Bilateral 11/26/2021   Procedure: BILATERAL FEMORAL ENDARTERECTOMY WITH VEIN PATCH ANGIOPLASTY;  Surgeon: Serene Gaile ORN, MD;  Location: [facility] OR;  Service: Vascular;  Laterality: Bilateral;   ENTEROSCOPY N/A 09/05/2023   Procedure: ENTEROSCOPY;  Surgeon: Maryruth Ole DASEN, MD;  Location: [facility] ENDOSCOPY;  Service: Endoscopy;  Laterality: N/A;   ESOPHAGOGASTRODUODENOSCOPY (EGD) WITH PROPOFOL  N/A 01/17/2018   Procedure: ESOPHAGOGASTRODUODENOSCOPY (EGD) WITH PROPOFOL ;  Surgeon: Toledo, Ladell POUR, MD;  Location: [facility] ENDOSCOPY;  Service: Gastroenterology;  Laterality: N/A;   ESOPHAGOGASTRODUODENOSCOPY (EGD) WITH PROPOFOL  N/A 09/12/2022   Procedure: ESOPHAGOGASTRODUODENOSCOPY (EGD) WITH PROPOFOL ;  Surgeon: Maryruth Ole DASEN, MD;  Location: [facility] ENDOSCOPY;  Service: Endoscopy;  Laterality: N/A;   EYE SURGERY     GROIN DEBRIDEMENT Bilateral 12/16/2021   Procedure: IRRIGATION AND DEBRIDEMENT OF BILATERAL GROINS;  Surgeon: Serene Gaile ORN, MD;  Location: [facility] OR;  Service: Vascular;  Laterality: Bilateral;   INCISION AND DRAINAGE OF WOUND Bilateral 12/13/2021   Procedure: IRRIGATION AND DEBRIDEMENT OF BILATERAL GROIN WOUNDS;  Surgeon: Lanis Fonda BRAVO, MD;  Location: [facility] OR;  Service: Vascular;  Laterality: Bilateral;   INSERTION OF ILIAC STENT Bilateral  11/26/2021   Procedure: BILATERAL ILIAC STENTING USING 56mmX80mm INNOVA STENT ON LEFT ILIAC AND 63mmX59mm, 38mmX79mm VBX AND 74mmX7.5cm VIABAHN STENT ON RIGHT ILIAC;  Surgeon: Serene Gaile ORN, MD;  Location: [facility] OR;  Service: Vascular;  Laterality: Bilateral;   INSERTION OF SEEDS IN PROSTATE     ORIF ANKLE FRACTURE Right    PERIPHERAL INTRAVASCULAR LITHOTRIPSY  08/08/2023   Procedure: PERIPHERAL INTRAVASCULAR LITHOTRIPSY;  Surgeon: Serene Gaile ORN, MD;  Location: [facility];  Service: Cardiovascular;;   PROSTATE BIOPSY N/A 04/01/2021   Procedure: PROSTATE BIOPSY GRAYCE;  Surgeon: Kassie Ozell SAUNDERS, MD;  Location: [facility] ORS;  Service: Urology;  Laterality: N/A;   VEIN HARVEST Bilateral 11/26/2021   Procedure: VEIN HARVEST OF BILATERAL SPAHENOUS VEINS;  Surgeon: Serene Gaile ORN, MD;  Location: [facility];  Service: Vascular;  Laterality: Bilateral;   WOUND DEBRIDEMENT Bilateral 12/22/2021   Procedure: INCISION AND DEBRIDEMENT OF BILATERAL GROINS;  Surgeon: Serene Gaile ORN, MD;  Location: [facility] OR;  Service: Vascular;  Laterality: Bilateral;     Social History:   reports that he quit smoking about 9 years ago. His smoking use included cigarettes. He started smoking about 56 years ago. He has a 47 pack-year smoking history. He has never been exposed to tobacco smoke. He has never used smokeless tobacco. He reports that he does not currently use alcohol. He reports that he does not use drugs.   Family History:  His family history includes Heart disease in his brother and mother.   Allergies Allergies  Allergen Reactions   Jardiance [Empagliflozin]     Loss of balance, hypoglycemic      Home Medications  Prior to Admission medications   Medication Sig Start Date End Date Taking? Authorizing Provider  allopurinol  (ZYLOPRIM ) 100 MG tablet Take 150 mg by mouth daily.   Yes [provider]  amiodarone  (PACERONE ) 200 MG tablet Take 200 mg by mouth 2 (two) times daily.   Yes [provider]  APIXABAN  (ELIQUIS ) VTE STARTER PACK (10MG  AND 5MG ) Take as directed on package: start with two-5mg  tablets twice daily for 7 days. On day 8, switch to one-5mg  tablet twice daily. 05/24/24  Yes Jens Durand, MD  aspirin  EC 81 MG tablet Take 81 mg by mouth every morning.   Yes [provider]  benzonatate (TESSALON) 100 MG capsule  Take 200 mg by mouth 3 (three) times daily as needed. 05/17/24  Yes [provider]  Cholecalciferol  (VITAMIN D3) 25 MCG (1000 UT) CAPS Take 2,000 Units by mouth daily.   Yes [provider]  ezetimibe  (ZETIA ) 10 MG tablet Take 1 tablet (10 mg total) by mouth daily. 04/10/18  Yes Gollan, Timothy J, MD  iron  polysaccharides (NIFEREX) 150 MG capsule Take 1 capsule (150 mg total) by mouth 2 (two) times daily. 03/11/24 06/09/24 Yes Sreenath, Sudheer B, MD  midodrine  (PROAMATINE ) 10 MG tablet Take 1 tablet (10 mg total) by mouth 3 (three) times daily with meals. 05/24/24  Yes Jens Durand, MD  pantoprazole  (PROTONIX ) 40 MG tablet Take 40 mg by mouth daily.   Yes [provider]  simvastatin  (ZOCOR ) 20 MG tablet Take 20 mg by mouth in the morning.   Yes [provider]  triamcinolone  ointment (KENALOG ) 0.1 % Apply 1 Application topically 2 (two) times daily as needed (psoriasis). 07/18/23  Yes [provider]  zolpidem  (AMBIEN ) 10 MG tablet Take 10 mg by mouth at bedtime.   Yes [provider]     Critical care time: 40 minutes     Inge Lecher, AGACNP-BC [facility] Prefer epic messenger for cross cover needs If after hours, please call E-link

## 2024-05-31 NOTE — Plan of Care (Signed)

## 2024-05-31 NOTE — Progress Notes (Signed)
 MD notified: hemoglobin is 6.9 this morning it dropped from 8.1 after 1 unit of blood and FFP yesterday. INR is 4.9. Dr. Jinny was informed at the bedside this morning that the patient continue to have black tarry stool almost every hour per night shift. Dr. Jinny mention that we would monitor for now since INR is still elevated.

## 2024-06-01 DIAGNOSIS — I5032 Chronic diastolic (congestive) heart failure: Secondary | ICD-10-CM | POA: Diagnosis not present

## 2024-06-01 DIAGNOSIS — Z992 Dependence on renal dialysis: Secondary | ICD-10-CM | POA: Diagnosis not present

## 2024-06-01 DIAGNOSIS — K7030 Alcoholic cirrhosis of liver without ascites: Secondary | ICD-10-CM

## 2024-06-01 DIAGNOSIS — A419 Sepsis, unspecified organism: Secondary | ICD-10-CM | POA: Diagnosis not present

## 2024-06-01 DIAGNOSIS — R7401 Elevation of levels of liver transaminase levels: Secondary | ICD-10-CM

## 2024-06-01 DIAGNOSIS — N186 End stage renal disease: Secondary | ICD-10-CM

## 2024-06-01 DIAGNOSIS — D649 Anemia, unspecified: Secondary | ICD-10-CM

## 2024-06-01 DIAGNOSIS — E7220 Disorder of urea cycle metabolism, unspecified: Secondary | ICD-10-CM

## 2024-06-01 LAB — BPAM RBC
Blood Product Expiration Date: 202510052359
Blood Product Expiration Date: 202511022359
ISSUE DATE / TIME: 202510020429
ISSUE DATE / TIME: 202510031037
Unit Type and Rh: 600
Unit Type and Rh: 6200

## 2024-06-01 LAB — CBC
HCT: 22.5 % — ABNORMAL LOW (ref 39.0–52.0)
Hemoglobin: 7.5 g/dL — ABNORMAL LOW (ref 13.0–17.0)
MCH: 32.2 pg (ref 26.0–34.0)
MCHC: 33.3 g/dL (ref 30.0–36.0)
MCV: 96.6 fL (ref 80.0–100.0)
Platelets: 107 K/uL — ABNORMAL LOW (ref 150–400)
RBC: 2.33 MIL/uL — ABNORMAL LOW (ref 4.22–5.81)
RDW: 29.2 % — ABNORMAL HIGH (ref 11.5–15.5)
WBC: 23 K/uL — ABNORMAL HIGH (ref 4.0–10.5)
nRBC: 0.4 % — ABNORMAL HIGH (ref 0.0–0.2)

## 2024-06-01 LAB — LACTIC ACID, PLASMA
Lactic Acid, Venous: 2 mmol/L (ref 0.5–1.9)
Lactic Acid, Venous: 2 mmol/L (ref 0.5–1.9)

## 2024-06-01 LAB — TYPE AND SCREEN
ABO/RH(D): A POS
Antibody Screen: NEGATIVE
Unit division: 0
Unit division: 0

## 2024-06-01 LAB — RENAL FUNCTION PANEL
Albumin: 2.5 g/dL — ABNORMAL LOW (ref 3.5–5.0)
Albumin: 2.9 g/dL — ABNORMAL LOW (ref 3.5–5.0)
Anion gap: 10 (ref 5–15)
Anion gap: 10 (ref 5–15)
BUN: 44 mg/dL — ABNORMAL HIGH (ref 8–23)
BUN: 40 mg/dL — ABNORMAL HIGH (ref 8–23)
CO2: 25 mmol/L (ref 22–32)
CO2: 23 mmol/L (ref 22–32)
Calcium: 8 mg/dL — ABNORMAL LOW (ref 8.9–10.3)
Calcium: 8 mg/dL — ABNORMAL LOW (ref 8.9–10.3)
Chloride: 98 mmol/L (ref 98–111)
Chloride: 102 mmol/L (ref 98–111)
Creatinine, Ser: 3.49 mg/dL — ABNORMAL HIGH (ref 0.61–1.24)
Creatinine, Ser: 2.69 mg/dL — ABNORMAL HIGH (ref 0.61–1.24)
GFR, Estimated: 19 mL/min — ABNORMAL LOW (ref >60)
GFR, Estimated: 25 mL/min — ABNORMAL LOW (ref >60)
Glucose, Bld: 134 mg/dL — ABNORMAL HIGH (ref 70–99)
Glucose, Bld: 131 mg/dL — ABNORMAL HIGH (ref 70–99)
Phosphorus: 3.1 mg/dL (ref 2.5–4.6)
Phosphorus: 3.3 mg/dL (ref 2.5–4.6)
Potassium: 3.9 mmol/L (ref 3.5–5.1)
Potassium: 3.7 mmol/L (ref 3.5–5.1)
Sodium: 133 mmol/L — ABNORMAL LOW (ref 135–145)
Sodium: 135 mmol/L (ref 135–145)

## 2024-06-01 LAB — HEMOGLOBIN AND HEMATOCRIT, BLOOD
HCT: 22.3 % — ABNORMAL LOW (ref 39.0–52.0)
HCT: 22 % — ABNORMAL LOW (ref 39.0–52.0)
Hemoglobin: 7.4 g/dL — ABNORMAL LOW (ref 13.0–17.0)
Hemoglobin: 7.4 g/dL — ABNORMAL LOW (ref 13.0–17.0)

## 2024-06-01 LAB — COOXEMETRY PANEL
Carboxyhemoglobin: 1.6 % — ABNORMAL HIGH (ref 0.5–1.5)
Methemoglobin: <0.7 % (ref 0.0–1.5)
O2 Saturation: 73.5 %
Total hemoglobin: 7.7 g/dL — ABNORMAL LOW (ref 12.0–16.0)
Total oxygen content: 72 %

## 2024-06-01 LAB — PROTIME-INR
INR: 3.5 — ABNORMAL HIGH (ref 0.8–1.2)
Prothrombin Time: 36.9 s — ABNORMAL HIGH (ref 11.4–15.2)

## 2024-06-01 LAB — MAGNESIUM: Magnesium: 2.3 mg/dL (ref 1.7–2.4)

## 2024-06-01 MED ORDER — ZOLPIDEM TARTRATE 5 MG PO TABS
5.0000 mg | ORAL_TABLET | Freq: Every evening | ORAL | Status: DC | PRN
  Administered 2024-06-01 – 2024-06-09 (×9): 5 mg via ORAL
  Filled 2024-06-01 (×9): qty 1

## 2024-06-01 NOTE — Care Management Important Message (Signed)
 Important Message  Patient Details  Name: Kevin Gross MRN: [id number] Date of Birth: 05-Jun-1956   Important Message Given:  Yes - Medicare IM     [name] 06/01/2024, 12:23 PM

## 2024-06-01 NOTE — Plan of Care (Signed)
 Continuing with plan of care.

## 2024-06-01 NOTE — Progress Notes (Addendum)
 PHARMACY CONSULT NOTE - ELECTROLYTES  Pharmacy Consult for Electrolyte Monitoring and Replacement   Recent Labs: Height: 5' 9 (175.3 cm) Weight: 118.5 kg (261 lb 3.9 oz) IBW/kg (Calculated) : 70.7 Estimated Creatinine Clearance: 26.4 mL/min (A) (by C-G formula based on SCr of 3.49 mg/dL (H)). Potassium (mmol/L)  Date Value  06/01/2024 3.9   Magnesium  (mg/dL)  Date Value  89/95/7974 2.3   Calcium  (mg/dL)  Date Value  89/95/7974 8.0 (L)   Albumin  (g/dL)  Date Value  89/95/7974 2.5 (L)   Phosphorus (mg/dL)  Date Value  89/95/7974 3.1   Sodium (mmol/L)  Date Value  06/01/2024 133 (L)  10/12/2021 145 (H)    Assessment  Kevin Gross is a 68 y.o. male presenting with septic shock. PMH significant for Portal vein thrombosis 9/17, CHF, ESRD on HD TTS, ETOH disorder, chronic bradycardia. Pharmacy has been consulted to monitor and replace electrolytes.  Diet: renal with fluid restriction  MIVF:  Pertinent medications:   Goal of Therapy: Electrolytes WNL  Plan:  No electrolyte replacement warranted at this time  Check Renal Panel at 1600 Pharmacy will continue to monitor replace per protocol   Thank you for allowing pharmacy to be a part of this patient's care.   [name], PharmD, BCPS Clinical Pharmacist 06/01/2024 6:23 AM

## 2024-06-01 NOTE — Progress Notes (Signed)
 NAME:  Kevin Gross, MRN:  [id number], DOB:  [birth year], LOS: 3 ADMISSION DATE:  05/29/2024, CONSULTATION DATE:  05/29/2024 REFERRING MD:  Dr. Claudene, CHIEF COMPLAINT:  Generalized Weakness & Hypotension    Brief Pt Description / Synopsis:  68 y.o. male with PMHx significant for ESRD on HD, HFpEF, COPD, OSA, Alcoholic Cirrhosis, and portal vein thrombosis, who is admitted with Multifactorial shock (hypovolemic/hemorrhagic vs septic), Sepsis of unknown etiology (questionable LLE cellulitis vs Pneumonia vs SBP), Acute GI Bleed, and Acute Decompensated Cirrhosis.   History of Present Illness:  Kevin Gross is a 68 y.o. male who presented to [facility] ED via EMS from his home to be evaluated for hypotension and weakness. He reports his initial outpatient hemodialysis appointment was 05/28/2024 at [facility]. This initial appointment is following a recent admission at [facility], of which he was discharged on 05/24/24.   His overall weakness began yesterday following his dialysis appointment and has continued today, with the addition of upper URI symptoms to include minor congestion and a sore throat as of this morning. Upon EMS arrival, he was found to have a BP of 64/28, to which he received fluids and BP increased to 94/42. He reported a sore throat and generalized fatigue/weakness that resulted in a fall this morning. He also stated at baseline he has no issue ambulating with a walker.  PCCM asked to admit for further workup and treatment.  Please see Significant Hospital Events section below for full detailed hospital course.   Pertinent  Medical History  ETOH Hypertensin Hyperlipidemia Congestive Heart Failure End Stage Renal Disease  Micro Data:  05/29/24: Blood cultures x2 >> no growth to date  05/29/24: MRSA PCR >> negative 05/29/24: Group A Strep PCR >> negative 05/29/24: COVID/FLU/RSV PCR >> negative 05/29/24: Legionella Urine Antigen >> 05/29/24: Strep Pneumo Urine Antigen >> 05/30/24:  Viral Respiratory Panel >> negative  Antimicrobials:   Anti-infectives (From admission, onward)    Start     Dose/Rate Route Frequency Ordered Stop   05/31/24 1800  piperacillin -tazobactam (ZOSYN ) IVPB 3.375 g  Status:  Discontinued        3.375 g 12.5 mL/hr over 240 Minutes Intravenous Every 6 hours 05/31/24 1300 05/31/24 1305   05/31/24 1400  piperacillin -tazobactam (ZOSYN ) IVPB 3.375 g        3.375 g 100 mL/hr over 30 Minutes Intravenous Every 6 hours 05/31/24 1306     05/29/24 2200  piperacillin -tazobactam (ZOSYN ) IVPB 2.25 g  Status:  Discontinued        2.25 g 100 mL/hr over 30 Minutes Intravenous Every 8 hours 05/29/24 1752 05/31/24 1300   05/29/24 1400  piperacillin -tazobactam (ZOSYN ) IVPB 3.375 g        3.375 g 100 mL/hr over 30 Minutes Intravenous  Once 05/29/24 1353 05/29/24 1432   05/29/24 1400  vancomycin  (VANCOREADY) IVPB 2000 mg/400 mL        2,000 mg 200 mL/hr over 120 Minutes Intravenous  Once 05/29/24 1353 05/29/24 1600       Significant Hospital Events: Including procedures, antibiotic start and stop dates in addition to other pertinent events   05/29/24: Started on Levophed  in the ED following refractory hypotension. Midodrine  10mg  x 1. 1.5L fluids given without improvement. Transferred to the ICU for further management. 2L [place] started with improvement in O2.  05/30/24: Remains on levophed  and started on vaso overnight. Minimal oxygen requirements. Oliguric with plan for HD today at 11.  Abdominal Arterial Ultrasound with patent portal vein and hepatopetal flow (PORTAL  VEIN THROMBOSIS RESOLVED), notes cirrhosis and moderate to large ascites.  05/31/24: No significant events overnight, nursing noted several small episodes of melena.  Hgb down to 6.9 this am, giving 1 unit pRBCs.  Remains on Levophed  and Vasopressin , start stress dose steroids and give Albumin .  Ultrasound yesterday negative for portal vein thrombosis, will give Vitamin K  for supratherapeutic INR.   Nephrology starting CRRT.  10/4 shock and on pressors  Interim History / Subjective:  Remains critically ill End stage liver cirrhosis and end stage kidney disease Prognosis is grave, remains on pressors   Objective   Blood pressure (!) 123/57, pulse 64, temperature 97.7 F (36.5 C), temperature source Oral, resp. rate 18, height 5' 9 (1.753 m), weight 118.5 kg, SpO2 95%. CVP:  [0 mmHg-14 mmHg] 0 mmHg  FiO2 (%):  [21 %] 21 %   Intake/Output Summary (Last 24 hours) at 06/01/2024 0729 Last data filed at 06/01/2024 0700 Gross per 24 hour  Intake 1952.08 ml  Output 2360 ml  Net -407.92 ml   Filed Weights   05/30/24 1437 05/31/24 0500 06/01/24 0413  Weight: 120.3 kg 72.6 kg 118.5 kg       Physical Examination:   General Appearance: No distress  EYES PERRLA, EOM intact.   NECK Supple, No JVD Pulmonary: normal breath sounds, No wheezing.  CardiovascularNormal S1,S2.  No m/r/g.   Abdomen: Benign, Soft, non-tender. Neurology UE/LE 5/5 strength, no focal deficits Ext pulses intact, cap refill intact +EDEMA ALL OTHER ROS ARE NEGATIVE Resolved Hospital Problem list     Assessment & Plan:  68 yo morbidly obese male admitted for decompensated liver cirrhosis and failure with progressive end stage kidney failure With severe shock hypovolemic/Hemorrhagic vs Septic exacerbated by Decompensated Liver Cirrhosis    Shock: HFpEF without acute exacerbation  Mildly Elevated Troponin due to demand ischemia  PMHx: Bradycardia, HTN, HLD, Paroxsymal A. Fib, prolonged QT -Echocardiogram 05/16/24: LVEF 60-65%, mild LVH, normal diastolic parameters, RV systolic function normal, RV size normal, normal pulmonary artery systolic pressure  -Continuous cardiac monitoring -Maintain MAP >65 -Cautious IV fluids -Transfusions as indicated   -Vasopressors as needed to maintain MAP goal -Trend lactic acid until normalized -HS Troponin peaked at 101 -Coox 75.9  -Volume removal with renal replacement  therapy  Sepsis of Unknown Etiology Questionable Cellulitis of LLE vs Pneumonia vs Spontaneous Bacterial Peritonitis  -Monitor fever curve -Trend WBC's & Procalcitonin -Follow cultures as above -Continue empiric Zosyn  pending cultures & sensitivities -Once INR stabilized, would consider Thoracentesis to rule out SBP   ESRD on Hemodialysis  Anion Gap Metabolic Acidosis  -Monitor I&O's / urinary output -Follow BMP -Ensure adequate renal perfusion -Avoid nephrotoxic agents as able -CRRT as per Nephrology   Suspected Acute GI Bleed superimposed on Chronic Iron  Deficiency Anemia Thrombocytopenia, suspect due to cirrhosis  Supratherapeutic INR -SCD's for VTE Prophylaxis  -Transfuse for Hgb <7 ~ status post 1 unit of pRBCs and 1 FFP ~ plan for 1 unit pRBC on 10/3 -Transfuse Platelets for platelet count <10K; <50K with active bleeding; <100K for Neurosurgical procedures -Vitamin K  10/3  #Alcoholic Liver Cirrhosis Acute Decompensated Liver Cirrhosis Transaminitis Hypoalbuminemia Portal Vein Thrombosis (Identified during previous admission) ~ RESOLVED  Hx: ETOH abuse -05/29/24 CT Chest/Abdomen/Pelvis: small bilateral pleural effusions, liver cirrhosis, distended gallbladder with small stones, exophytic lesions of left kidney, aortic atherosclerosis. -05/30/24 Abdominal Pelvic Arterial/Venous Flow Doppler: shows resolution of previous portal vein thrombus, also notes cirrhosis and moderate to large volume of ascites  -Monitor LFT's and coags -Continue  Protonix  BID -Holding Eliquis  currently until active GI Bleed ruled out -GI following, appreciate input  -Once INR stabilized, recommend Paracentesis     OVERALL POOR PROGNOSIS, DAUGHTER AWARE OF GRAVE CONDITION PATIENT WITH MULTIORGAN FAILURE AND END STAGE DISEASE I DO NOT THINK PATIENT WILL BE GOING HOME      Best Practice (right click and Reselect all SmartList Selections daily)   Diet/type: Regular consistency (see  orders) DVT prophylaxis: SCD GI prophylaxis: PPI Lines: Central line, Dialysis Catheter, and yes and it is still needed Foley:  Yes, and it is still needed Code Status:  full code Last date of multidisciplinary goals of care discussion [10/3]  10/3: Pt and family updated at bedside on plan of care.  Labs   CBC: Recent Labs  Lab 05/29/24 1256 05/29/24 2040 05/30/24 0338 05/30/24 1400 05/31/24 9361 05/31/24 1451 05/31/24 1729 05/31/24 2338 06/01/24 0457  WBC 21.1* 32.3* 29.9*  --  27.4*  --   --   --  23.0*  NEUTROABS 18.6* 27.5*  --   --   --   --   --   --   --   HGB 7.9* 7.7* 6.6*   < > 6.9* 8.1* 7.8* 7.5* 7.5*  HCT 24.9* 24.4* 20.5*   < > 21.0* 24.1* 23.6* 22.1* 22.5*  MCV 106.9* 107.0* 105.1*  --  99.1  --   --   --  96.6  PLT 117* 180 163  --  116*  --   --   --  107*   < > = values in this interval not displayed.    Basic Metabolic Panel: Recent Labs  Lab 05/29/24 1256 05/30/24 0338 05/31/24 0459 05/31/24 1558 06/01/24 0457  NA 137 138 133* 133* 133*  K 3.5 4.0 3.9 3.8 3.9  CL 101 100 98 97* 98  CO2 19* 25 22 22 25   GLUCOSE 82 147* 128* 140* 134*  BUN 71* 74* 60* 52* 44*  CREATININE 6.72* 6.89* 5.37* 4.62* 3.49*  CALCIUM  7.9* 7.9* 8.0* 7.6* 8.0*  MG 2.1 1.7 2.1  --  2.3  PHOS  --  4.9* 4.0 3.5 3.1   GFR: Estimated Creatinine Clearance: 26.4 mL/min (A) (by C-G formula based on SCr of 3.49 mg/dL (H)). Recent Labs  Lab 05/29/24 1256 05/29/24 1458 05/29/24 2040 05/29/24 2349 05/30/24 0337 05/30/24 0338 05/31/24 0638 05/31/24 1028 05/31/24 1451 06/01/24 0457  PROCALCITON 2.62  --   --   --   --   --   --   --   --   --   WBC 21.1*  --  32.3*  --   --  29.9* 27.4*  --   --  23.0*  LATICACIDVEN 5.8*   < > 3.2* 3.2* 2.9*  --   --  2.7* 2.6*  --    < > = values in this interval not displayed.    Liver Function Tests: Recent Labs  Lab 05/29/24 1256 05/30/24 0338 05/31/24 0459 05/31/24 1558 06/01/24 0457  AST 301* 270* 250*  --   --   ALT 90*  91* 94*  --   --   ALKPHOS 226* 205* 206*  --   --   BILITOT 3.1* 3.6* 5.4*  --   --   PROT 4.9* 4.4* 4.6*  --   --   ALBUMIN  2.3* 2.0* 2.1* 2.1* 2.5*   No results for input(s): LIPASE, AMYLASE in the last 168 hours. No results for input(s): AMMONIA in the last 168 hours.  ABG    Component Value Date/Time   PHART 7.298 (L) 11/27/2021 0345   PCO2ART 39.7 11/27/2021 0345   PO2ART 116 (H) 11/27/2021 0345   HCO3 21.9 05/29/2024 2040   TCO2 21 (L) 08/08/2023 0916   ACIDBASEDEF 2.8 (H) 05/29/2024 2040   O2SAT 75.9 05/31/2024 0927     Coagulation Profile: Recent Labs  Lab 05/29/24 1256 05/30/24 0338 05/31/24 0459 06/01/24 0457  INR 6.4* 7.9* 4.9* 3.5*    Cardiac Enzymes: No results for input(s): CKTOTAL, CKMB, CKMBINDEX, TROPONINI in the last 168 hours.  HbA1C: Hgb A1c MFr Bld  Date/Time Value Ref Range Status  11/27/2021 03:28 AM 5.6 4.8 - 5.6 % Final    Comment:    (NOTE) Pre diabetes:          5.7%-6.4%  Diabetes:              >6.4%  Glycemic control for   <7.0% adults with diabetes     CBG: Recent Labs  Lab 05/29/24 1540  GLUCAP 113*     DVT/GI PRX  assessed I Assessed the need for Labs I Assessed the need for Foley I Assessed the need for Central Venous Line Family Discussion when available I Assessed the need for Mobilization I made an Assessment of medications to be adjusted accordingly Safety Risk assessment completed  CASE DISCUSSED IN MULTIDISCIPLINARY ROUNDS WITH ICU TEAM     Critical Care Time devoted to patient care services described in this note is 55 minutes.  Critical care was necessary to treat /prevent imminent and life-threatening deterioration. Overall, patient is critically ill, prognosis is guarded.  Patient with Multiorgan failure and at high risk for cardiac arrest and death.    Nickolas Alm Cellar, M.D.  [facility]  Medical Director [facility]

## 2024-06-01 NOTE — Progress Notes (Signed)
 Order received from provider to draw a co-ox and lactic acid.

## 2024-06-01 NOTE — Progress Notes (Signed)
 [facility]  ROUNDING NOTE   Subjective:   Kevin Gross  is a 68 y.o.  male  with past medical conditions including hypertension, COPD, obstructive sleep apnea with CPAP, anemia, and recent acute kidney injury requiring hemodialysis. Patient presents to ED with decreased blood pressure and has been admitted to ICU for Septic shock (HCC) [A41.9, R65.21]  Patient seen and evaluated at bedside. Alert and oriented. States he went to dialysis on Tuesday, no complications.   Update: Patient sitting up in bed in ICU Alert and oriented Room air Pressors: Levo and Vaso Albumin  infusing CRRT with net UF -63ml/hr   Objective:  Vital signs in last 24 hours:  Temp:  [97.7 F (36.5 C)-98.2 F (36.8 C)] 97.7 F (36.5 C) (10/04 0800) Pulse Rate:  [45-70] 61 (10/04 1130) Resp:  [10-24] 16 (10/04 1130) BP: (89-147)/(42-130) 132/58 (10/04 1130) SpO2:  [91 %-100 %] 94 % (10/04 1130) FiO2 (%):  [21 %] 21 % (10/03 2231) Weight:  [118.5 kg] 118.5 kg (10/04 0413)  Weight change: -1.8 kg Filed Weights   05/30/24 1437 05/31/24 0500 06/01/24 0413  Weight: 120.3 kg 72.6 kg 118.5 kg    Intake/Output: I/O last 3 completed shifts: In: 3201.4 [P.O.:300; I.V.:1864.8; Blood:330; Other:66; IV Piggyback:640.6] Out: 2360    Intake/Output this shift:  Total I/O In: 223 [I.V.:123; IV Piggyback:100.1] Out: 449 [Stool:20]  Physical Exam: General: NAD  Head: Normocephalic, atraumatic. Moist oral mucosal membranes  Eyes: Anicteric  Lungs:  Clear to auscultation, normal effort  Heart: Regular rate and rhythm  Abdomen:  Soft, nontender  Extremities: 3+ peripheral edema.  Neurologic: Awake, alert, conversant  Skin: Generalized ecchymosis, weeping  Access: Right IJ PermCath    Basic Metabolic Panel: Recent Labs  Lab 05/29/24 1256 05/30/24 0338 05/31/24 0459 05/31/24 1558 06/01/24 0457  NA 137 138 133* 133* 133*  K 3.5 4.0 3.9 3.8 3.9  CL 101 100 98 97* 98  CO2 19* 25 22 22 25    GLUCOSE 82 147* 128* 140* 134*  BUN 71* 74* 60* 52* 44*  CREATININE 6.72* 6.89* 5.37* 4.62* 3.49*  CALCIUM  7.9* 7.9* 8.0* 7.6* 8.0*  MG 2.1 1.7 2.1  --  2.3  PHOS  --  4.9* 4.0 3.5 3.1    Liver Function Tests: Recent Labs  Lab 05/29/24 1256 05/30/24 0338 05/31/24 0459 05/31/24 1558 06/01/24 0457  AST 301* 270* 250*  --   --   ALT 90* 91* 94*  --   --   ALKPHOS 226* 205* 206*  --   --   BILITOT 3.1* 3.6* 5.4*  --   --   PROT 4.9* 4.4* 4.6*  --   --   ALBUMIN  2.3* 2.0* 2.1* 2.1* 2.5*   No results for input(s): LIPASE, AMYLASE in the last 168 hours. No results for input(s): AMMONIA in the last 168 hours.   CBC: Recent Labs  Lab 05/29/24 1256 05/29/24 2040 05/30/24 0338 05/30/24 1400 05/31/24 0638 05/31/24 1451 05/31/24 1729 05/31/24 2338 06/01/24 0457 06/01/24 1125  WBC 21.1* 32.3* 29.9*  --  27.4*  --   --   --  23.0*  --   NEUTROABS 18.6* 27.5*  --   --   --   --   --   --   --   --   HGB 7.9* 7.7* 6.6*   < > 6.9* 8.1* 7.8* 7.5* 7.5* 7.4*  HCT 24.9* 24.4* 20.5*   < > 21.0* 24.1* 23.6* 22.1* 22.5* 22.3*  MCV  106.9* 107.0* 105.1*  --  99.1  --   --   --  96.6  --   PLT 117* 180 163  --  116*  --   --   --  107*  --    < > = values in this interval not displayed.    Cardiac Enzymes: No results for input(s): CKTOTAL, CKMB, CKMBINDEX, TROPONINI in the last 168 hours.  BNP: Invalid input(s): POCBNP  CBG: Recent Labs  Lab 05/29/24 1540  GLUCAP 113*     Microbiology: Results for orders placed or performed during the hospital encounter of 05/29/24  Resp panel by RT-PCR (RSV, Flu A&B, Covid) Anterior Nasal Swab     Status: None   Collection Time: 05/29/24 12:56 PM   Specimen: Anterior Nasal Swab  Result Value Ref Range Status   SARS Coronavirus 2 by RT PCR NEGATIVE NEGATIVE Final    Comment: (NOTE) SARS-CoV-2 target nucleic acids are NOT DETECTED.  The SARS-CoV-2 RNA is generally detectable in upper respiratory specimens during the  acute phase of infection. The lowest concentration of SARS-CoV-2 viral copies this assay can detect is 138 copies/mL. A negative result does not preclude SARS-Cov-2 infection and should not be used as the sole basis for treatment or other patient management decisions. A negative result may occur with  improper specimen collection/handling, submission of specimen other than nasopharyngeal swab, presence of viral mutation(s) within the areas targeted by this assay, and inadequate number of viral copies(<138 copies/mL). A negative result must be combined with clinical observations, patient history, and epidemiological information. The expected result is Negative.  Fact Sheet for Patients:  BloggerCourse.com  Fact Sheet for Healthcare Providers:  SeriousBroker.it  This test is no t yet approved or cleared by the [place]  FDA and  has been authorized for detection and/or diagnosis of SARS-CoV-2 by FDA under an Emergency Use Authorization (EUA). This EUA will remain  in effect (meaning this test can be used) for the duration of the COVID-19 declaration under Section 564(b)(1) of the Act, 21 U.S.C.section 360bbb-3(b)(1), unless the authorization is terminated  or revoked sooner.       Influenza A by PCR NEGATIVE NEGATIVE Final   Influenza B by PCR NEGATIVE NEGATIVE Final    Comment: (NOTE) The Xpert Xpress SARS-CoV-2/FLU/RSV plus assay is intended as an aid in the diagnosis of influenza from Nasopharyngeal swab specimens and should not be used as a sole basis for treatment. Nasal washings and aspirates are unacceptable for Xpert Xpress SARS-CoV-2/FLU/RSV testing.  Fact Sheet for Patients: BloggerCourse.com  Fact Sheet for Healthcare Providers: SeriousBroker.it  This test is not yet approved or cleared by the [place]  FDA and has been authorized for detection and/or  diagnosis of SARS-CoV-2 by FDA under an Emergency Use Authorization (EUA). This EUA will remain in effect (meaning this test can be used) for the duration of the COVID-19 declaration under Section 564(b)(1) of the Act, 21 U.S.C. section 360bbb-3(b)(1), unless the authorization is terminated or revoked.     Resp Syncytial Virus by PCR NEGATIVE NEGATIVE Final    Comment: (NOTE) Fact Sheet for Patients: BloggerCourse.com  Fact Sheet for Healthcare Providers: SeriousBroker.it  This test is not yet approved or cleared by the [place]  FDA and has been authorized for detection and/or diagnosis of SARS-CoV-2 by FDA under an Emergency Use Authorization (EUA). This EUA will remain in effect (meaning this test can be used) for the duration of the COVID-19 declaration under Section 564(b)(1) of the Act, 21 U.S.C.  section 360bbb-3(b)(1), unless the authorization is terminated or revoked.  Performed at [facility], [address] [place], [place] [address]   Blood culture (routine x 2)     Status: None (Preliminary result)   Collection Time: 05/29/24 12:56 PM   Specimen: BLOOD  Result Value Ref Range Status   Specimen Description BLOOD RIGHT ANTECUBITAL  Final   Special Requests   Final    BOTTLES DRAWN AEROBIC AND ANAEROBIC Blood Culture results may not be optimal due to an inadequate volume of blood received in culture bottles   Culture   Final    NO GROWTH 3 DAYS Performed at [facility], [address], [place], [place] [address]    Report Status PENDING  Incomplete  Blood culture (routine x 2)     Status: None (Preliminary result)   Collection Time: 05/29/24 12:56 PM   Specimen: BLOOD  Result Value Ref Range Status   Specimen Description BLOOD LEFT ANTECUBITAL  Final   Special Requests   Final    BOTTLES DRAWN AEROBIC AND ANAEROBIC Blood Culture results may not be optimal due to an inadequate volume of  blood received in culture bottles   Culture   Final    NO GROWTH 3 DAYS Performed at [facility], [address] [place], [place] [address]    Report Status PENDING  Incomplete  MRSA Next Gen by PCR, Nasal     Status: None   Collection Time: 05/29/24  2:58 PM   Specimen: Nasal Mucosa; Nasal Swab  Result Value Ref Range Status   MRSA by PCR Next Gen NOT DETECTED NOT DETECTED Final    Comment: (NOTE) The GeneXpert MRSA Assay (FDA approved for NASAL specimens only), is one component of a comprehensive MRSA colonization surveillance program. It is not intended to diagnose MRSA infection nor to guide or monitor treatment for MRSA infections. Test performance is not FDA approved in patients less than 5 years old. Performed at [facility], [address] [place], [place] [address]   Group A Strep by PCR     Status: None   Collection Time: 05/29/24  2:58 PM   Specimen: Throat; Sterile Swab  Result Value Ref Range Status   Group A Strep by PCR NOT DETECTED NOT DETECTED Final    Comment: Performed at [facility], [address] [place], [place] [address]  Respiratory (~20 pathogens) panel by PCR     Status: None   Collection Time: 05/29/24  3:00 PM  Result Value Ref Range Status   Adenovirus NOT DETECTED NOT DETECTED Final   Coronavirus 229E NOT DETECTED NOT DETECTED Final    Comment: (NOTE) The Coronavirus on the Respiratory Panel, DOES NOT test for the novel  Coronavirus (2019 nCoV)    Coronavirus HKU1 NOT DETECTED NOT DETECTED Final   Coronavirus NL63 NOT DETECTED NOT DETECTED Final   Coronavirus OC43 NOT DETECTED NOT DETECTED Final   Metapneumovirus NOT DETECTED NOT DETECTED Final   Rhinovirus / Enterovirus NOT DETECTED NOT DETECTED Final   Influenza A NOT DETECTED NOT DETECTED Final   Influenza B NOT DETECTED NOT DETECTED Final   Parainfluenza Virus 1 NOT DETECTED NOT DETECTED Final   Parainfluenza Virus 2 NOT DETECTED NOT DETECTED Final    Parainfluenza Virus 3 NOT DETECTED NOT DETECTED Final   Parainfluenza Virus 4 NOT DETECTED NOT DETECTED Final   Respiratory Syncytial Virus NOT DETECTED NOT DETECTED Final   Bordetella pertussis NOT DETECTED NOT DETECTED Final  Bordetella Parapertussis NOT DETECTED NOT DETECTED Final   Chlamydophila pneumoniae NOT DETECTED NOT DETECTED Final   Mycoplasma pneumoniae NOT DETECTED NOT DETECTED Final    Comment: Performed at [facility], [address], [place], [place] [address]  MRSA Next Gen by PCR, Nasal     Status: None   Collection Time: 05/29/24  3:50 PM   Specimen: Nasal Mucosa; Nasal Swab  Result Value Ref Range Status   MRSA by PCR Next Gen NOT DETECTED NOT DETECTED Final    Comment: (NOTE) The GeneXpert MRSA Assay (FDA approved for NASAL specimens only), is one component of a comprehensive MRSA colonization surveillance program. It is not intended to diagnose MRSA infection nor to guide or monitor treatment for MRSA infections. Test performance is not FDA approved in patients less than 13 years old. Performed at [facility], [address] [place], [place] [address]     Coagulation Studies: Recent Labs    05/29/24 1256 05/30/24 0338 05/31/24 0459 06/01/24 0457  LABPROT 59.0* 69.1* 47.9* 36.9*  INR 6.4* 7.9* 4.9* 3.5*     Urinalysis: No results for input(s): COLORURINE, LABSPEC, PHURINE, GLUCOSEU, HGBUR, BILIRUBINUR, KETONESUR, PROTEINUR, UROBILINOGEN, NITRITE, LEUKOCYTESUR in the last 72 hours.  Invalid input(s): APPERANCEUR     Imaging: US  ABDOMINAL PELVIC ART/VENT FLOW DOPPLER LIMITED Result Date: 05/30/2024 CLINICAL DATA:  History of portal vein thrombosis EXAM: LIMITED DUPLEX ULTRASOUND OF LIVER TECHNIQUE: Color and duplex Doppler ultrasound was performed to evaluate the hepatic in-flow and out-flow vessels. COMPARISON:  Ultrasound 05/17/2024, CT 05/29/2024 FINDINGS: Liver: Cirrhotic morphology of the liver. No focal  lesion, mass or intrahepatic biliary ductal dilatation. Main Portal Vein size: 0.9 cm Portal Vein Velocities Main Prox:  19.5 cm/sec Main Mid: 28.9 cm/sec Main Dist:  34.1 cm/sec Right: 32.7 cm/sec Left: 20 cm/sec Hepatic Vein Velocities Not evaluated IVC: Not evaluated Hepatic Artery Velocity: Not evaluated Splenic Vein Velocity: Not a by Portal Vein Occlusion/Thrombus: No Splenic Vein Occlusion/Thrombus: Not assessed Ascites: Moderate to large volume of ascites within the abdomen and pelvis. Hepatopetal flow within the imaged portal veins. IMPRESSION: 1. Cirrhotic morphology of the liver. Moderate to large volume of ascites. 2. Patent portal veins with hepatopetal flow. Previously noted portal vein thrombus has resolved Electronically Signed   By: Luke Bun M.D.   On: 05/30/2024 20:00       Medications:    albumin  human Stopped (06/01/24 1049)   norepinephrine  (LEVOPHED ) Adult infusion 20 mcg/min (06/01/24 1100)   piperacillin -tazobactam Stopped (06/01/24 0539)   prismasol BGK 4/2.5 400 mL/hr at 06/01/24 0219   prismasol BGK 4/2.5 400 mL/hr at 06/01/24 0655   prismasol BGK 4/2.5 1,500 mL/hr at 06/01/24 1015   vasopressin  0.04 Units/min (06/01/24 1100)     vitamin C   500 mg Oral BID   Chlorhexidine  Gluconate Cloth  6 each Topical Daily   epoetin  alfa-epbx (RETACRIT ) injection  10,000 Units Subcutaneous Q T,Th,Sat-1800   feeding supplement (NEPRO CARB STEADY)  237 mL Oral TID BM   hydrocortisone sod succinate (SOLU-CORTEF) inj  50 mg Intravenous Q6H   multivitamin  1 tablet Oral QHS   pantoprazole  (PROTONIX ) IV  40 mg Intravenous Q12H   thiamine   100 mg Oral Daily   alteplase , alteplase , docusate sodium , heparin , heparin , heparin , polyethylene glycol  Assessment/ Plan:  [name] is a 68 y.o.  male  with past medical conditions including hypertension, COPD, obstructive sleep apnea with CPAP, anemia, and chronic kidney disease stage IV, who was admitted  to [facility] on 05/29/2024  for Septic shock (HCC) [A41.9, R65.21]  [facility]/TTS/RT permcath  Acute kidney injury on chronic kidney disease stage IV. Baseline creatinine 2.77 with GFR 24 06/30/2024. CT abd/pelvis shows dense lesion on left kidney. CRRT initiated on 10/3 to manage volume status. Will increase UF to -172ml/hr.      Lab Results  Component Value Date   CREATININE 3.49 (H) 06/01/2024   CREATININE 4.62 (H) 05/31/2024   CREATININE 5.37 (H) 05/31/2024    Intake/Output Summary (Last 24 hours) at 06/01/2024 1143 Last data filed at 06/01/2024 1100 Gross per 24 hour  Intake 1784.28 ml  Output 2809 ml  Net -1024.72 ml   2. Hypotension likely due to shock secondary to liver cirrhosis.  Failed attempts to wean norepinephrine  yesterday.  Patient remains on norepinephrine  and vasopressin , managed by primary team.  3. Anemia of chronic kidney disease Lab Results  Component Value Date   HGB 7.4 (L) 06/01/2024  Hemoglobin remains decreased with dark, tarry stools reported overnight.  GI consulted.  Patient has received multiple blood products during this admission.  Continue Retacrit  10000 units  subcu injections.  4.  Secondary Hyperparathyroidism: with outpatient labs: PTH 52.7, phosphorus 4.1, calcium  8.6 on 7/2.  Lab Results  Component Value Date   CALCIUM  8.0 (L) 06/01/2024   CAION 1.21 08/08/2023   PHOS 3.1 06/01/2024   Calcium  and phosphorus acceptable.   LOS: 3 Nyla Creason 10/4/202511:43 AM

## 2024-06-01 NOTE — Plan of Care (Addendum)
 A&O patient currently receiving CRRT (Net Hourly Fluid Balance Goal: -50 ml/hr) with no difficulties noted this shift; Levophed  infusing @ 20 mcg/min. And Vasopressin  @ 0.04 units/min. Patient denies pain this shift. Patient has had multiple smear to small- green mucous stools. Patient only had 60 ml of PO intake this shift. Dressing changed to sacrum, BL arms and left leg. Call light placed within reach and patient instructed to call when assistance is needed. Pt verbalized understanding.  Problem: Education: Goal: Knowledge of General Education information will improve Description: Including pain rating scale, medication(s)/side effects and non-pharmacologic comfort measures Outcome: Progressing   Problem: Clinical Measurements: Goal: Respiratory complications will improve Outcome: Progressing Goal: Cardiovascular complication will be avoided Outcome: Progressing   Problem: Coping: Goal: Level of anxiety will decrease Outcome: Progressing   Problem: Elimination: Goal: Will not experience complications related to bowel motility Outcome: Progressing Goal: Will not experience complications related to urinary retention Outcome: Progressing   Problem: Pain Managment: Goal: General experience of comfort will improve and/or be controlled Outcome: Progressing   Problem: Safety: Goal: Ability to remain free from injury will improve Outcome: Progressing

## 2024-06-01 NOTE — Progress Notes (Signed)
 Patient continues on CRRT along with vasopressor support; net volume increased today to and patient has tolerated well.

## 2024-06-01 NOTE — Progress Notes (Signed)
 Weight discrepancy noted. Patients last weight was 72.6 kg and this mornings weight is 118.5 kg; Verified with patient that his last weight taken at home was 257 lbs. After, discussing with patient current weight obtained this morning via bed was entered.

## 2024-06-02 DIAGNOSIS — N186 End stage renal disease: Secondary | ICD-10-CM | POA: Diagnosis not present

## 2024-06-02 DIAGNOSIS — Z992 Dependence on renal dialysis: Secondary | ICD-10-CM | POA: Diagnosis not present

## 2024-06-02 DIAGNOSIS — A419 Sepsis, unspecified organism: Secondary | ICD-10-CM | POA: Diagnosis not present

## 2024-06-02 DIAGNOSIS — I5032 Chronic diastolic (congestive) heart failure: Secondary | ICD-10-CM | POA: Diagnosis not present

## 2024-06-02 LAB — CBC
HCT: 22.4 % — ABNORMAL LOW (ref 39.0–52.0)
Hemoglobin: 7.3 g/dL — ABNORMAL LOW (ref 13.0–17.0)
MCH: 32.4 pg (ref 26.0–34.0)
MCHC: 32.6 g/dL (ref 30.0–36.0)
MCV: 99.6 fL (ref 80.0–100.0)
Platelets: 98 K/uL — ABNORMAL LOW (ref 150–400)
RBC: 2.25 MIL/uL — ABNORMAL LOW (ref 4.22–5.81)
RDW: 29.6 % — ABNORMAL HIGH (ref 11.5–15.5)
WBC: 23 K/uL — ABNORMAL HIGH (ref 4.0–10.5)
nRBC: 0.2 % (ref 0.0–0.2)

## 2024-06-02 LAB — RENAL FUNCTION PANEL
Albumin: 2.6 g/dL — ABNORMAL LOW (ref 3.5–5.0)
Albumin: 2.6 g/dL — ABNORMAL LOW (ref 3.5–5.0)
Anion gap: 11 (ref 5–15)
Anion gap: 11 (ref 5–15)
BUN: 32 mg/dL — ABNORMAL HIGH (ref 8–23)
BUN: 29 mg/dL — ABNORMAL HIGH (ref 8–23)
CO2: 24 mmol/L (ref 22–32)
CO2: 24 mmol/L (ref 22–32)
Calcium: 8.2 mg/dL — ABNORMAL LOW (ref 8.9–10.3)
Calcium: 8.2 mg/dL — ABNORMAL LOW (ref 8.9–10.3)
Chloride: 100 mmol/L (ref 98–111)
Chloride: 100 mmol/L (ref 98–111)
Creatinine, Ser: 2.37 mg/dL — ABNORMAL HIGH (ref 0.61–1.24)
Creatinine, Ser: 1.97 mg/dL — ABNORMAL HIGH (ref 0.61–1.24)
GFR, Estimated: 29 mL/min — ABNORMAL LOW (ref >60)
GFR, Estimated: 37 mL/min — ABNORMAL LOW (ref >60)
Glucose, Bld: 129 mg/dL — ABNORMAL HIGH (ref 70–99)
Glucose, Bld: 138 mg/dL — ABNORMAL HIGH (ref 70–99)
Phosphorus: 2.8 mg/dL (ref 2.5–4.6)
Phosphorus: 2.7 mg/dL (ref 2.5–4.6)
Potassium: 3.9 mmol/L (ref 3.5–5.1)
Potassium: 4 mmol/L (ref 3.5–5.1)
Sodium: 135 mmol/L (ref 135–145)
Sodium: 135 mmol/L (ref 135–145)

## 2024-06-02 LAB — HEMOGLOBIN AND HEMATOCRIT, BLOOD
HCT: 22.2 % — ABNORMAL LOW (ref 39.0–52.0)
HCT: 22.4 % — ABNORMAL LOW (ref 39.0–52.0)
HCT: 22.5 % — ABNORMAL LOW (ref 39.0–52.0)
HCT: 23.6 % — ABNORMAL LOW (ref 39.0–52.0)
Hemoglobin: 7.3 g/dL — ABNORMAL LOW (ref 13.0–17.0)
Hemoglobin: 7.4 g/dL — ABNORMAL LOW (ref 13.0–17.0)
Hemoglobin: 7.4 g/dL — ABNORMAL LOW (ref 13.0–17.0)
Hemoglobin: 7.6 g/dL — ABNORMAL LOW (ref 13.0–17.0)

## 2024-06-02 LAB — MAGNESIUM: Magnesium: 2.5 mg/dL — ABNORMAL HIGH (ref 1.7–2.4)

## 2024-06-02 LAB — PROTIME-INR
INR: 2.4 — ABNORMAL HIGH (ref 0.8–1.2)
Prothrombin Time: 27.6 s — ABNORMAL HIGH (ref 11.4–15.2)

## 2024-06-02 NOTE — Progress Notes (Signed)
 Per nephrology provider increase CRRT output removal to 153mL/hr.

## 2024-06-02 NOTE — Plan of Care (Addendum)
   A&O patient continues to be on CRRT (Net Hourly Fluid Balance Goal: -100 ml/hr) with no difficulties noted this shift; Levophed  titrated per order currently infusing @ 16 mcg/min. And Vasopressin  @ 0.04 units/min. Patient denies pain this shift. Patient has had several small- green stools. Patient was able to tolerate cpap for a short amount of time tonight and then placed on 2L [place] to keep O2 sats >90% this morning. Ambien  somewhat effective;  per patient, he was able to sleep about 3 hours tonight. Dressing changed to sacrum. Call light placed within reach and patient instructed to call when assistance is needed. Pt verbalized understanding     Problem: Education: Goal: Knowledge of General Education information will improve Description: Including pain rating scale, medication(s)/side effects and non-pharmacologic comfort measures Outcome: Progressing   Problem: Clinical Measurements: Goal: Respiratory complications will improve Outcome: Progressing Goal: Cardiovascular complication will be avoided Outcome: Progressing   Problem: Coping: Goal: Level of anxiety will decrease Outcome: Progressing   Problem: Elimination: Goal: Will not experience complications related to bowel motility Outcome: Progressing Goal: Will not experience complications related to urinary retention Outcome: Progressing   Problem: Pain Managment: Goal: General experience of comfort will improve and/or be controlled Outcome: Progressing   Problem: Safety: Goal: Ability to remain free from injury will improve Outcome: Progressing   Problem: Skin Integrity: Goal: Risk for impaired skin integrity will decrease Outcome: Progressing

## 2024-06-02 NOTE — Progress Notes (Signed)
 NAME:  Kevin Gross, MRN:  [id number], DOB:  [birth year], LOS: 4 ADMISSION DATE:  05/29/2024, CONSULTATION DATE:  05/29/2024 REFERRING MD:  Dr. Claudene, CHIEF COMPLAINT:  Generalized Weakness & Hypotension    Brief Pt Description / Synopsis:  68 y.o. male with PMHx significant for ESRD on HD, HFpEF, COPD, OSA, Alcoholic Cirrhosis, and portal vein thrombosis, who is admitted with Multifactorial shock (hypovolemic/hemorrhagic vs septic), Sepsis of unknown etiology (questionable LLE cellulitis vs Pneumonia vs SBP), Acute GI Bleed, and Acute Decompensated Cirrhosis.   History of Present Illness:  Kevin Gross is a 68 y.o. male who presented to [facility] ED via EMS from his home to be evaluated for hypotension and weakness. He reports his initial outpatient hemodialysis appointment was 05/28/2024 at [facility]. This initial appointment is following a recent admission at [facility], of which he was discharged on 05/24/24.   His overall weakness began yesterday following his dialysis appointment and has continued today, with the addition of upper URI symptoms to include minor congestion and a sore throat as of this morning. Upon EMS arrival, he was found to have a BP of 64/28, to which he received fluids and BP increased to 94/42. He reported a sore throat and generalized fatigue/weakness that resulted in a fall this morning. He also stated at baseline he has no issue ambulating with a walker.  PCCM asked to admit for further workup and treatment.  Please see Significant Hospital Events section below for full detailed hospital course.   Pertinent  Medical History  ETOH Hypertensin Hyperlipidemia Congestive Heart Failure End Stage Renal Disease  Micro Data:  05/29/24: Blood cultures x2 >> no growth to date  05/29/24: MRSA PCR >> negative 05/29/24: Group A Strep PCR >> negative 05/29/24: COVID/FLU/RSV PCR >> negative 05/29/24: Legionella Urine Antigen >> 05/29/24: Strep Pneumo Urine Antigen >> 05/30/24:  Viral Respiratory Panel >> negative  Antimicrobials:   Anti-infectives (From admission, onward)    Start     Dose/Rate Route Frequency Ordered Stop   05/31/24 1800  piperacillin -tazobactam (ZOSYN ) IVPB 3.375 g  Status:  Discontinued        3.375 g 12.5 mL/hr over 240 Minutes Intravenous Every 6 hours 05/31/24 1300 05/31/24 1305   05/31/24 1400  piperacillin -tazobactam (ZOSYN ) IVPB 3.375 g        3.375 g 100 mL/hr over 30 Minutes Intravenous Every 6 hours 05/31/24 1306 06/07/24 1159   05/29/24 2200  piperacillin -tazobactam (ZOSYN ) IVPB 2.25 g  Status:  Discontinued        2.25 g 100 mL/hr over 30 Minutes Intravenous Every 8 hours 05/29/24 1752 05/31/24 1300   05/29/24 1400  piperacillin -tazobactam (ZOSYN ) IVPB 3.375 g        3.375 g 100 mL/hr over 30 Minutes Intravenous  Once 05/29/24 1353 05/29/24 1432   05/29/24 1400  vancomycin  (VANCOREADY) IVPB 2000 mg/400 mL        2,000 mg 200 mL/hr over 120 Minutes Intravenous  Once 05/29/24 1353 05/29/24 1600       Significant Hospital Events: Including procedures, antibiotic start and stop dates in addition to other pertinent events   05/29/24: Started on Levophed  in the ED following refractory hypotension. Midodrine  10mg  x 1. 1.5L fluids given without improvement. Transferred to the ICU for further management. 2L [place] started with improvement in O2.  05/30/24: Remains on levophed  and started on vaso overnight. Minimal oxygen requirements. Oliguric with plan for HD today at 11.  Abdominal Arterial Ultrasound with patent portal vein and hepatopetal flow (PORTAL  VEIN THROMBOSIS RESOLVED), notes cirrhosis and moderate to large ascites.  05/31/24: No significant events overnight, nursing noted several small episodes of melena.  Hgb down to 6.9 this am, giving 1 unit pRBCs.  Remains on Levophed  and Vasopressin , start stress dose steroids and give Albumin .  Ultrasound yesterday negative for portal vein thrombosis, will give Vitamin K  for supratherapeutic  INR.  Nephrology starting CRRT.  10/4 shock and on pressors 10/5 remains on pressors and CRRT  Interim History / Subjective:  Remains critically ill End stage liver cirrhosis and end stage kidney disease Prognosis is grave, remains on pressors   Objective   Blood pressure (!) 114/54, pulse (!) 57, temperature 98.1 F (36.7 C), temperature source Oral, resp. rate 16, height 5' 9 (1.753 m), weight 115.6 kg, SpO2 100%. CVP:  [0 mmHg-70 mmHg] 70 mmHg  FiO2 (%):  [21 %] 21 %   Intake/Output Summary (Last 24 hours) at 06/02/2024 1808 Last data filed at 06/02/2024 1800 Gross per 24 hour  Intake 1118.88 ml  Output 3908 ml  Net -2789.12 ml   Filed Weights   05/31/24 0500 06/01/24 0413 06/02/24 0500  Weight: 72.6 kg 118.5 kg 115.6 kg     Physical Examination:   General Appearance: No distress  EYES PERRLA, EOM intact.  Icteric sclera NECK Supple, No JVD Pulmonary: normal breath sounds, No wheezing.  CardiovascularNormal S1,S2.  No m/r/g.   Abdomen: Benign, Soft, non-tender. Neurology UE/LE 5/5 strength, no focal deficits Ext pulses intact, cap refill intact +edema ALL OTHER ROS ARE NEGATIVE     Assessment & Plan:  68 yo morbidly obese male morbidly obese male admitted for decompensated liver cirrhosis and failure with progressive end stage kidney failure With severe shock hypovolemic/Hemorrhagic vs Septic exacerbated by Decompensated Liver Cirrhosis    Shock: HFpEF without acute exacerbation  Mildly Elevated Troponin due to demand ischemia  PMHx: Bradycardia, HTN, HLD, Paroxsymal A. Fib, prolonged QT -Echocardiogram 05/16/24: LVEF 60-65%, mild LVH, normal diastolic parameters, RV systolic function normal, RV size normal, normal pulmonary artery systolic pressure  -Continuous cardiac monitoring -Vasopressors as needed to maintain MAP goal -HS Troponin peaked at 101 -Coox 75.9  -Volume removal with renal replacement therapy  Sepsis of Unknown Etiology Questionable Cellulitis of LLE vs  Pneumonia vs Spontaneous Bacterial Peritonitis  -Follow cultures as above -Continue empiric Zosyn  pending cultures & sensitivities  ESRD on Hemodialysis  Anion Gap Metabolic Acidosis  -Monitor I&O's / urinary output -Follow BMP -Ensure adequate renal perfusion -Avoid nephrotoxic agents as able -CRRT as per Nephrology   Suspected Acute GI Bleed superimposed on Chronic Iron  Deficiency Anemia Thrombocytopenia, suspect due to cirrhosis  Supratherapeutic INR now 2.4 -SCD's for VTE Prophylaxis  -Transfuse for Hgb <7 ~ status post 1 unit of pRBCs and 1 FFP ~ plan for 1 unit pRBC on 10/3 -Transfuse Platelets for platelet count <10K; <50K with active bleeding; <100K for Neurosurgical procedures -Vitamin K  10/3  Alcoholic Liver Cirrhosis Acute Decompensated Liver Cirrhosis Transaminitis Hypoalbuminemia Portal Vein Thrombosis (Identified during previous admission) ~ RESOLVED  Hx: ETOH abuse -05/29/24 CT Chest/Abdomen/Pelvis: small bilateral pleural effusions, liver cirrhosis, distended gallbladder with small stones, exophytic lesions of left kidney, aortic atherosclerosis. -05/30/24 Abdominal Pelvic Arterial/Venous Flow Doppler: shows resolution of previous portal vein thrombus, also notes cirrhosis and moderate to large volume of ascites  -Continue Protonix  BID -Holding Eliquis  currently until active GI Bleed ruled out -GI following, appreciate input  No need for paracentesis at this time    OVERALL POOR PROGNOSIS PATIENT WITH MULTIORGAN FAILURE AND  END STAGE DISEASE I DO NOT THINK PATIENT WILL BE GOING HOME     Best Practice (right click and Reselect all SmartList Selections daily)   Diet/type: Regular consistency (see orders) DVT prophylaxis: SCD GI prophylaxis: PPI Lines: Central line, Dialysis Catheter, and yes and it is still needed Foley:  Yes, and it is still needed Code Status:  full code Last date of multidisciplinary goals of care discussion [10/3]  10/3: Pt and  family updated at bedside on plan of care.  Labs   CBC: Recent Labs  Lab 05/29/24 1256 05/29/24 2040 05/30/24 0338 05/30/24 1400 05/31/24 0638 05/31/24 1451 06/01/24 0457 06/01/24 1125 06/01/24 1806 06/02/24 0001 06/02/24 0505 06/02/24 1227 06/02/24 1630  WBC 21.1* 32.3* 29.9*  --  27.4*  --  23.0*  --   --   --  23.0*  --   --   NEUTROABS 18.6* 27.5*  --   --   --   --   --   --   --   --   --   --   --   HGB 7.9* 7.7* 6.6*   < > 6.9*   < > 7.5*   < > 7.4* 7.4* 7.3*  7.4* 7.3* 7.6*  HCT 24.9* 24.4* 20.5*   < > 21.0*   < > 22.5*   < > 22.0* 22.4* 22.4*  22.5* 22.2* 23.6*  MCV 106.9* 107.0* 105.1*  --  99.1  --  96.6  --   --   --  99.6  --   --   PLT 117* 180 163  --  116*  --  107*  --   --   --  98*  --   --    < > = values in this interval not displayed.    Basic Metabolic Panel: Recent Labs  Lab 05/29/24 1256 05/29/24 1256 05/30/24 0338 05/31/24 0459 05/31/24 1558 06/01/24 0457 06/01/24 1545 06/02/24 0505 06/02/24 1630  NA 137  --  138 133* 133* 133* 135 135 135  K 3.5  --  4.0 3.9 3.8 3.9 3.7 3.9 4.0  CL 101  --  100 98 97* 98 102 100 100  CO2 19*  --  25 22 22 25 23 24 24   GLUCOSE 82  --  147* 128* 140* 134* 131* 129* 138*  BUN 71*  --  74* 60* 52* 44* 40* 32* 29*  CREATININE 6.72*  --  6.89* 5.37* 4.62* 3.49* 2.69* 2.37* 1.97*  CALCIUM  7.9*  --  7.9* 8.0* 7.6* 8.0* 8.0* 8.2* 8.2*  MG 2.1  --  1.7 2.1  --  2.3  --  2.5*  --   PHOS  --    < > 4.9* 4.0 3.5 3.1 3.3 2.8 2.7   < > = values in this interval not displayed.   GFR: Estimated Creatinine Clearance: 46.3 mL/min (A) (by C-G formula based on SCr of 1.97 mg/dL (H)). Recent Labs  Lab 05/29/24 1256 05/29/24 1458 05/30/24 0338 05/31/24 0638 05/31/24 1028 05/31/24 1451 06/01/24 0457 06/01/24 1125 06/01/24 1410 06/02/24 0505  PROCALCITON 2.62  --   --   --   --   --   --   --   --   --   WBC 21.1*   < > 29.9* 27.4*  --   --  23.0*  --   --  23.0*  LATICACIDVEN 5.8*   < >  --   --  2.7* 2.6*  --  2.0* 2.0*  --    < > = values in this interval not displayed.    Liver Function Tests: Recent Labs  Lab 05/29/24 1256 05/30/24 0338 05/31/24 0459 05/31/24 1558 06/01/24 0457 06/01/24 1545 06/02/24 0505 06/02/24 1630  AST 301* 270* 250*  --   --   --   --   --   ALT 90* 91* 94*  --   --   --   --   --   ALKPHOS 226* 205* 206*  --   --   --   --   --   BILITOT 3.1* 3.6* 5.4*  --   --   --   --   --   PROT 4.9* 4.4* 4.6*  --   --   --   --   --   ALBUMIN  2.3* 2.0* 2.1* 2.1* 2.5* 2.9* 2.6* 2.6*   No results for input(s): LIPASE, AMYLASE in the last 168 hours. No results for input(s): AMMONIA in the last 168 hours.  ABG    Component Value Date/Time   PHART 7.298 (L) 11/27/2021 0345   PCO2ART 39.7 11/27/2021 0345   PO2ART 116 (H) 11/27/2021 0345   HCO3 21.9 05/29/2024 2040   TCO2 21 (L) 08/08/2023 0916   ACIDBASEDEF 2.8 (H) 05/29/2024 2040   O2SAT 73.5 06/01/2024 1125     Coagulation Profile: Recent Labs  Lab 05/29/24 1256 05/30/24 0338 05/31/24 0459 06/01/24 0457 06/02/24 0505  INR 6.4* 7.9* 4.9* 3.5* 2.4*    Cardiac Enzymes: No results for input(s): CKTOTAL, CKMB, CKMBINDEX, TROPONINI in the last 168 hours.  HbA1C: Hgb A1c MFr Bld  Date/Time Value Ref Range Status  11/27/2021 03:28 AM 5.6 4.8 - 5.6 % Final    Comment:    (NOTE) Pre diabetes:          5.7%-6.4%  Diabetes:              >6.4%  Glycemic control for   <7.0% adults with diabetes     CBG: Recent Labs  Lab 05/29/24 1540  GLUCAP 113*      DVT/GI PRX  assessed I Assessed the need for Labs I Assessed the need for Foley I Assessed the need for Central Venous Line Family Discussion when available I Assessed the need for Mobilization I made an Assessment of medications to be adjusted accordingly Safety Risk assessment completed    Critical Care Time devoted to patient care services described in this note is 55 minutes.  Critical care was necessary to treat /prevent  imminent and life-threatening deterioration. Overall, patient is critically ill, prognosis is guarded.  Patient with Multiorgan failure and at high risk for cardiac arrest and death.    Nickolas Alm Cellar, M.D.  [facility]  Medical Director [facility]

## 2024-06-02 NOTE — Progress Notes (Signed)
 PHARMACY CONSULT NOTE - ELECTROLYTES  Pharmacy Consult for Electrolyte Monitoring and Replacement   Recent Labs: Height: 5' 9 (175.3 cm) Weight: 115.6 kg (254 lb 13.6 oz) IBW/kg (Calculated) : 70.7 Estimated Creatinine Clearance: 38.5 mL/min (A) (by C-G formula based on SCr of 2.37 mg/dL (H)). Potassium (mmol/L)  Date Value  06/02/2024 3.9   Magnesium  (mg/dL)  Date Value  89/94/7974 2.5 (H)   Calcium  (mg/dL)  Date Value  89/94/7974 8.2 (L)   Albumin  (g/dL)  Date Value  89/94/7974 2.6 (L)   Phosphorus (mg/dL)  Date Value  89/94/7974 2.8   Sodium (mmol/L)  Date Value  06/02/2024 135  10/12/2021 145 (H)    Assessment  Kevin Gross is a 68 y.o. male presenting with septic shock. PMH significant for Portal vein thrombosis 9/17, CHF, ESRD on HD TTS, ETOH disorder, chronic bradycardia. Pharmacy has been consulted to monitor and replace electrolytes.  Goal of Therapy: Electrolytes WNL  Plan:  No electrolyte replacement warranted at this time  Check Renal Panel at 0500 and 1600 while on CRRT Pharmacy will continue to monitor replace per protocol   Thank you for allowing pharmacy to be a part of this patient's care.   Adriana JONETTA Bolster, PharmD, BCPS Clinical Pharmacist 06/02/2024 7:21 AM

## 2024-06-02 NOTE — Progress Notes (Signed)
 Fecal pouch offered to patient due to frequent small bowel movements that patient voices is interrupting his sleep.  Patient agreed to try the fecal pouch; area cleansed and fecal pouch placed.

## 2024-06-02 NOTE — Plan of Care (Signed)
 Continuing with plan of care.

## 2024-06-02 NOTE — Progress Notes (Signed)
 [facility]  ROUNDING NOTE   Subjective:   Kevin Gross  is a 68 y.o.  male  with past medical conditions including hypertension, COPD, obstructive sleep apnea with CPAP, anemia, and recent acute kidney injury requiring hemodialysis. Patient presents to ED with decreased blood pressure and has been admitted to ICU for Septic shock (HCC) [A41.9, R65.21]  Patient seen and evaluated at bedside. Alert and oriented. States he went to dialysis on Tuesday, no complications.   Update: Patient sitting up in bed in ICU Alert and oriented 2L [place] Pressors: Levo and Vaso, weaning as tolerated  CRRT with net UF -138ml/hr   Objective:  Vital signs in last 24 hours:  Temp:  [97.5 F (36.4 C)-98.4 F (36.9 C)] 98.4 F (36.9 C) (10/05 0800) Pulse Rate:  [53-66] 53 (10/05 1145) Resp:  [11-32] 12 (10/05 1145) BP: (75-145)/(42-97) 108/51 (10/05 1145) SpO2:  [87 %-100 %] 98 % (10/05 1145) FiO2 (%):  [21 %] 21 % (10/05 0400) Weight:  [115.6 kg] 115.6 kg (10/05 0500)  Weight change: -2.9 kg Filed Weights   05/31/24 0500 06/01/24 0413 06/02/24 0500  Weight: 72.6 kg 118.5 kg 115.6 kg    Intake/Output: I/O last 3 completed shifts: In: 1912.3 [P.O.:120; I.V.:1077.4; Other:61; IV Piggyback:653.9] Out: 5363 [Stool:20]   Intake/Output this shift:  Total I/O In: 134.8 [I.V.:134.8] Out: 593 [Stool:50]  Physical Exam: General: NAD  Head: Normocephalic, atraumatic. Moist oral mucosal membranes  Eyes: Anicteric  Lungs:  Clear to auscultation, normal effort  Heart: Regular rate and rhythm  Abdomen:  Soft, nontender  Extremities: 2-3+ peripheral edema.  Neurologic: Awake, alert, conversant  Skin: Generalized ecchymosis, weeping  Access: Right IJ PermCath    Basic Metabolic Panel: Recent Labs  Lab 05/29/24 1256 05/29/24 1256 05/30/24 0338 05/31/24 0459 05/31/24 1558 06/01/24 0457 06/01/24 1545 06/02/24 0505  NA 137  --  138 133* 133* 133* 135 135  K 3.5  --  4.0 3.9  3.8 3.9 3.7 3.9  CL 101  --  100 98 97* 98 102 100  CO2 19*  --  25 22 22 25 23 24   GLUCOSE 82  --  147* 128* 140* 134* 131* 129*  BUN 71*  --  74* 60* 52* 44* 40* 32*  CREATININE 6.72*  --  6.89* 5.37* 4.62* 3.49* 2.69* 2.37*  CALCIUM  7.9*  --  7.9* 8.0* 7.6* 8.0* 8.0* 8.2*  MG 2.1  --  1.7 2.1  --  2.3  --  2.5*  PHOS  --    < > 4.9* 4.0 3.5 3.1 3.3 2.8   < > = values in this interval not displayed.    Liver Function Tests: Recent Labs  Lab 05/29/24 1256 05/30/24 0338 05/31/24 0459 05/31/24 1558 06/01/24 0457 06/01/24 1545 06/02/24 0505  AST 301* 270* 250*  --   --   --   --   ALT 90* 91* 94*  --   --   --   --   ALKPHOS 226* 205* 206*  --   --   --   --   BILITOT 3.1* 3.6* 5.4*  --   --   --   --   PROT 4.9* 4.4* 4.6*  --   --   --   --   ALBUMIN  2.3* 2.0* 2.1* 2.1* 2.5* 2.9* 2.6*   No results for input(s): LIPASE, AMYLASE in the last 168 hours. No results for input(s): AMMONIA in the last 168 hours.   CBC: Recent  Labs  Lab 05/29/24 1256 05/29/24 2040 05/30/24 0338 05/30/24 1400 05/31/24 0638 05/31/24 1451 06/01/24 0457 06/01/24 1125 06/01/24 1806 06/02/24 0001 06/02/24 0505  WBC 21.1* 32.3* 29.9*  --  27.4*  --  23.0*  --   --   --  23.0*  NEUTROABS 18.6* 27.5*  --   --   --   --   --   --   --   --   --   HGB 7.9* 7.7* 6.6*   < > 6.9*   < > 7.5* 7.4* 7.4* 7.4* 7.3*  7.4*  HCT 24.9* 24.4* 20.5*   < > 21.0*   < > 22.5* 22.3* 22.0* 22.4* 22.4*  22.5*  MCV 106.9* 107.0* 105.1*  --  99.1  --  96.6  --   --   --  99.6  PLT 117* 180 163  --  116*  --  107*  --   --   --  98*   < > = values in this interval not displayed.    Cardiac Enzymes: No results for input(s): CKTOTAL, CKMB, CKMBINDEX, TROPONINI in the last 168 hours.  BNP: Invalid input(s): POCBNP  CBG: Recent Labs  Lab 05/29/24 1540  GLUCAP 113*     Microbiology: Results for orders placed or performed during the hospital encounter of 05/29/24  Resp panel by RT-PCR (RSV,  Flu A&B, Covid) Anterior Nasal Swab     Status: None   Collection Time: 05/29/24 12:56 PM   Specimen: Anterior Nasal Swab  Result Value Ref Range Status   SARS Coronavirus 2 by RT PCR NEGATIVE NEGATIVE Final    Comment: (NOTE) SARS-CoV-2 target nucleic acids are NOT DETECTED.  The SARS-CoV-2 RNA is generally detectable in upper respiratory specimens during the acute phase of infection. The lowest concentration of SARS-CoV-2 viral copies this assay can detect is 138 copies/mL. A negative result does not preclude SARS-Cov-2 infection and should not be used as the sole basis for treatment or other patient management decisions. A negative result may occur with  improper specimen collection/handling, submission of specimen other than nasopharyngeal swab, presence of viral mutation(s) within the areas targeted by this assay, and inadequate number of viral copies(<138 copies/mL). A negative result must be combined with clinical observations, patient history, and epidemiological information. The expected result is Negative.  Fact Sheet for Patients:  BloggerCourse.com  Fact Sheet for Healthcare Providers:  SeriousBroker.it  This test is no t yet approved or cleared by the [place]  FDA and  has been authorized for detection and/or diagnosis of SARS-CoV-2 by FDA under an Emergency Use Authorization (EUA). This EUA will remain  in effect (meaning this test can be used) for the duration of the COVID-19 declaration under Section 564(b)(1) of the Act, 21 U.S.C.section 360bbb-3(b)(1), unless the authorization is terminated  or revoked sooner.       Influenza A by PCR NEGATIVE NEGATIVE Final   Influenza B by PCR NEGATIVE NEGATIVE Final    Comment: (NOTE) The Xpert Xpress SARS-CoV-2/FLU/RSV plus assay is intended as an aid in the diagnosis of influenza from Nasopharyngeal swab specimens and should not be used as a sole basis for  treatment. Nasal washings and aspirates are unacceptable for Xpert Xpress SARS-CoV-2/FLU/RSV testing.  Fact Sheet for Patients: BloggerCourse.com  Fact Sheet for Healthcare Providers: SeriousBroker.it  This test is not yet approved or cleared by the [place]  FDA and has been authorized for detection and/or diagnosis of SARS-CoV-2 by FDA under an Emergency Use Authorization (  EUA). This EUA will remain in effect (meaning this test can be used) for the duration of the COVID-19 declaration under Section 564(b)(1) of the Act, 21 U.S.C. section 360bbb-3(b)(1), unless the authorization is terminated or revoked.     Resp Syncytial Virus by PCR NEGATIVE NEGATIVE Final    Comment: (NOTE) Fact Sheet for Patients: BloggerCourse.com  Fact Sheet for Healthcare Providers: SeriousBroker.it  This test is not yet approved or cleared by the [place]  FDA and has been authorized for detection and/or diagnosis of SARS-CoV-2 by FDA under an Emergency Use Authorization (EUA). This EUA will remain in effect (meaning this test can be used) for the duration of the COVID-19 declaration under Section 564(b)(1) of the Act, 21 U.S.C. section 360bbb-3(b)(1), unless the authorization is terminated or revoked.  Performed at [facility], [address] [place], [place] [address]   Blood culture (routine x 2)     Status: None (Preliminary result)   Collection Time: 05/29/24 12:56 PM   Specimen: BLOOD  Result Value Ref Range Status   Specimen Description BLOOD RIGHT ANTECUBITAL  Final   Special Requests   Final    BOTTLES DRAWN AEROBIC AND ANAEROBIC Blood Culture results may not be optimal due to an inadequate volume of blood received in culture bottles   Culture   Final    NO GROWTH 4 DAYS Performed at [facility], [address] [place], [place] [address]    Report Status  PENDING  Incomplete  Blood culture (routine x 2)     Status: None (Preliminary result)   Collection Time: 05/29/24 12:56 PM   Specimen: BLOOD  Result Value Ref Range Status   Specimen Description BLOOD LEFT ANTECUBITAL  Final   Special Requests   Final    BOTTLES DRAWN AEROBIC AND ANAEROBIC Blood Culture results may not be optimal due to an inadequate volume of blood received in culture bottles   Culture   Final    NO GROWTH 4 DAYS Performed at [facility], [address] [place], [place] [address]    Report Status PENDING  Incomplete  MRSA Next Gen by PCR, Nasal     Status: None   Collection Time: 05/29/24  2:58 PM   Specimen: Nasal Mucosa; Nasal Swab  Result Value Ref Range Status   MRSA by PCR Next Gen NOT DETECTED NOT DETECTED Final    Comment: (NOTE) The GeneXpert MRSA Assay (FDA approved for NASAL specimens only), is one component of a comprehensive MRSA colonization surveillance program. It is not intended to diagnose MRSA infection nor to guide or monitor treatment for MRSA infections. Test performance is not FDA approved in patients less than 72 years old. Performed at [facility], [address] [place], [place] [address]   Group A Strep by PCR     Status: None   Collection Time: 05/29/24  2:58 PM   Specimen: Throat; Sterile Swab  Result Value Ref Range Status   Group A Strep by PCR NOT DETECTED NOT DETECTED Final    Comment: Performed at [facility], [address] [place], [place] [address]  Respiratory (~20 pathogens) panel by PCR     Status: None   Collection Time: 05/29/24  3:00 PM  Result Value Ref Range Status   Adenovirus NOT DETECTED NOT DETECTED Final   Coronavirus 229E NOT DETECTED NOT DETECTED Final    Comment: (NOTE) The Coronavirus on the Respiratory Panel, DOES NOT test for the novel  Coronavirus (2019 nCoV)  Coronavirus HKU1 NOT DETECTED NOT DETECTED Final   Coronavirus NL63 NOT DETECTED NOT DETECTED Final    Coronavirus OC43 NOT DETECTED NOT DETECTED Final   Metapneumovirus NOT DETECTED NOT DETECTED Final   Rhinovirus / Enterovirus NOT DETECTED NOT DETECTED Final   Influenza A NOT DETECTED NOT DETECTED Final   Influenza B NOT DETECTED NOT DETECTED Final   Parainfluenza Virus 1 NOT DETECTED NOT DETECTED Final   Parainfluenza Virus 2 NOT DETECTED NOT DETECTED Final   Parainfluenza Virus 3 NOT DETECTED NOT DETECTED Final   Parainfluenza Virus 4 NOT DETECTED NOT DETECTED Final   Respiratory Syncytial Virus NOT DETECTED NOT DETECTED Final   Bordetella pertussis NOT DETECTED NOT DETECTED Final   Bordetella Parapertussis NOT DETECTED NOT DETECTED Final   Chlamydophila pneumoniae NOT DETECTED NOT DETECTED Final   Mycoplasma pneumoniae NOT DETECTED NOT DETECTED Final    Comment: Performed at [facility], [address], [place], [place] [address]  MRSA Next Gen by PCR, Nasal     Status: None   Collection Time: 05/29/24  3:50 PM   Specimen: Nasal Mucosa; Nasal Swab  Result Value Ref Range Status   MRSA by PCR Next Gen NOT DETECTED NOT DETECTED Final    Comment: (NOTE) The GeneXpert MRSA Assay (FDA approved for NASAL specimens only), is one component of a comprehensive MRSA colonization surveillance program. It is not intended to diagnose MRSA infection nor to guide or monitor treatment for MRSA infections. Test performance is not FDA approved in patients less than 30 years old. Performed at [facility], [address] [place], [place] [address]     Coagulation Studies: Recent Labs    05/31/24 0459 06/01/24 0457 06/02/24 0505  LABPROT 47.9* 36.9* 27.6*  INR 4.9* 3.5* 2.4*     Urinalysis: No results for input(s): COLORURINE, LABSPEC, PHURINE, GLUCOSEU, HGBUR, BILIRUBINUR, KETONESUR, PROTEINUR, UROBILINOGEN, NITRITE, LEUKOCYTESUR in the last 72 hours.  Invalid input(s): APPERANCEUR     Imaging: No results found.      Medications:     norepinephrine  (LEVOPHED ) Adult infusion 16 mcg/min (06/02/24 1200)   piperacillin -tazobactam Stopped (06/02/24 0546)   prismasol BGK 4/2.5 400 mL/hr at 06/02/24 0340   prismasol BGK 4/2.5 400 mL/hr at 06/02/24 0010   prismasol BGK 4/2.5 1,500 mL/hr at 06/02/24 1030   vasopressin  0.04 Units/min (06/02/24 1200)     vitamin C   500 mg Oral BID   Chlorhexidine  Gluconate Cloth  6 each Topical Daily   epoetin  alfa-epbx (RETACRIT ) injection  10,000 Units Subcutaneous Q T,Th,Sat-1800   feeding supplement (NEPRO CARB STEADY)  237 mL Oral TID BM   hydrocortisone sod succinate (SOLU-CORTEF) inj  50 mg Intravenous Q6H   multivitamin  1 tablet Oral QHS   pantoprazole  (PROTONIX ) IV  40 mg Intravenous Q12H   thiamine   100 mg Oral Daily   alteplase , alteplase , docusate sodium , heparin , heparin , heparin , polyethylene glycol, zolpidem   Assessment/ Plan:  [name] is a 68 y.o.  male  with past medical conditions including hypertension, COPD, obstructive sleep apnea with CPAP, anemia, and chronic kidney disease stage IV, who was admitted to [facility] on 05/29/2024 for Septic shock (HCC) [A41.9, R65.21]  [facility]/TTS/RT permcath  Acute kidney injury on chronic kidney disease stage IV. Baseline creatinine 2.77 with GFR 24 06/30/2024. CT abd/pelvis shows dense lesion on left kidney. Tolerating CRRT with UF3.5L yesterday. Will increase net to 125ml today. Continue as tolerated.      Lab Results  Component Value  Date   CREATININE 2.37 (H) 06/02/2024   CREATININE 2.69 (H) 06/01/2024   CREATININE 3.49 (H) 06/01/2024    Intake/Output Summary (Last 24 hours) at 06/02/2024 1203 Last data filed at 06/02/2024 1200 Gross per 24 hour  Intake 1133.16 ml  Output 3523 ml  Net -2389.84 ml   2. Hypotension likely due to shock secondary to liver cirrhosis.  Patient remains on norepinephrine  and vasopressin , managed by primary team. Will wean as tolerated.  3. Anemia of chronic kidney disease Lab  Results  Component Value Date   HGB 7.3 (L) 06/02/2024   HGB 7.4 (L) 06/02/2024  Hemoglobin remains decreased with dark, tarry stools reported overnight.  GI consulted.  Patient has received multiple blood products during this admission.  Continue Retacrit  10000 units  subcu injections.  4.  Secondary Hyperparathyroidism: with outpatient labs: PTH 52.7, phosphorus 4.1, calcium  8.6 on 7/2.  Lab Results  Component Value Date   CALCIUM  8.2 (L) 06/02/2024   CAION 1.21 08/08/2023   PHOS 2.8 06/02/2024   Monitoring bone minerals during this admission.    LOS: 4 Geral[place]ne Barraclough 10/5/202512:03 PM

## 2024-06-03 DIAGNOSIS — D509 Iron deficiency anemia, unspecified: Secondary | ICD-10-CM | POA: Diagnosis not present

## 2024-06-03 LAB — CBC
HCT: 24.9 % — ABNORMAL LOW (ref 39.0–52.0)
Hemoglobin: 8.1 g/dL — ABNORMAL LOW (ref 13.0–17.0)
MCH: 32.9 pg (ref 26.0–34.0)
MCHC: 32.5 g/dL (ref 30.0–36.0)
MCV: 101.2 fL — ABNORMAL HIGH (ref 80.0–100.0)
Platelets: 106 K/uL — ABNORMAL LOW (ref 150–400)
RBC: 2.46 MIL/uL — ABNORMAL LOW (ref 4.22–5.81)
RDW: 29.5 % — ABNORMAL HIGH (ref 11.5–15.5)
WBC: 28.6 K/uL — ABNORMAL HIGH (ref 4.0–10.5)
nRBC: 0.2 % (ref 0.0–0.2)

## 2024-06-03 LAB — RENAL FUNCTION PANEL
Albumin: 2.9 g/dL — ABNORMAL LOW (ref 3.5–5.0)
Albumin: 2.8 g/dL — ABNORMAL LOW (ref 3.5–5.0)
Anion gap: 8 (ref 5–15)
Anion gap: 12 (ref 5–15)
BUN: 30 mg/dL — ABNORMAL HIGH (ref 8–23)
BUN: 31 mg/dL — ABNORMAL HIGH (ref 8–23)
CO2: 25 mmol/L (ref 22–32)
CO2: 23 mmol/L (ref 22–32)
Calcium: 8.2 mg/dL — ABNORMAL LOW (ref 8.9–10.3)
Calcium: 8 mg/dL — ABNORMAL LOW (ref 8.9–10.3)
Chloride: 102 mmol/L (ref 98–111)
Chloride: 100 mmol/L (ref 98–111)
Creatinine, Ser: 1.8 mg/dL — ABNORMAL HIGH (ref 0.61–1.24)
Creatinine, Ser: 1.8 mg/dL — ABNORMAL HIGH (ref 0.61–1.24)
GFR, Estimated: 41 mL/min — ABNORMAL LOW (ref >60)
GFR, Estimated: 41 mL/min — ABNORMAL LOW (ref >60)
Glucose, Bld: 151 mg/dL — ABNORMAL HIGH (ref 70–99)
Glucose, Bld: 133 mg/dL — ABNORMAL HIGH (ref 70–99)
Phosphorus: 3.2 mg/dL (ref 2.5–4.6)
Phosphorus: 3.9 mg/dL (ref 2.5–4.6)
Potassium: 4 mmol/L (ref 3.5–5.1)
Potassium: 4 mmol/L (ref 3.5–5.1)
Sodium: 135 mmol/L (ref 135–145)
Sodium: 135 mmol/L (ref 135–145)

## 2024-06-03 LAB — HEMOGLOBIN AND HEMATOCRIT, BLOOD
HCT: 23.8 % — ABNORMAL LOW (ref 39.0–52.0)
HCT: 24.1 % — ABNORMAL LOW (ref 39.0–52.0)
HCT: 24.3 % — ABNORMAL LOW (ref 39.0–52.0)
HCT: 24.7 % — ABNORMAL LOW (ref 39.0–52.0)
HCT: 24.9 % — ABNORMAL LOW (ref 39.0–52.0)
Hemoglobin: 7.6 g/dL — ABNORMAL LOW (ref 13.0–17.0)
Hemoglobin: 7.6 g/dL — ABNORMAL LOW (ref 13.0–17.0)
Hemoglobin: 7.8 g/dL — ABNORMAL LOW (ref 13.0–17.0)
Hemoglobin: 8 g/dL — ABNORMAL LOW (ref 13.0–17.0)
Hemoglobin: 8.1 g/dL — ABNORMAL LOW (ref 13.0–17.0)

## 2024-06-03 LAB — CULTURE, BLOOD (ROUTINE X 2)
Culture: NO GROWTH
Culture: NO GROWTH

## 2024-06-03 LAB — PROTIME-INR
INR: 2 — ABNORMAL HIGH (ref 0.8–1.2)
Prothrombin Time: 23.9 s — ABNORMAL HIGH (ref 11.4–15.2)

## 2024-06-03 LAB — MAGNESIUM: Magnesium: 2.5 mg/dL — ABNORMAL HIGH (ref 1.7–2.4)

## 2024-06-03 MED ORDER — SODIUM CHLORIDE 0.9% IV SOLUTION: Freq: Once | INTRAVENOUS | Status: AC

## 2024-06-03 MED ORDER — ORAL CARE MOUTH RINSE: 15.0000 mL | OROMUCOSAL | Status: DC | PRN

## 2024-06-03 MED ORDER — SODIUM CHLORIDE 0.9 % IV SOLN: INTRAVENOUS | Status: AC

## 2024-06-03 MED ORDER — ALBUMIN HUMAN 25 % IV SOLN
25.0000 g | Freq: Two times a day (BID) | INTRAVENOUS | Status: AC
  Administered 2024-06-03 (×2): 25 g via INTRAVENOUS
  Filled 2024-06-03: qty 100

## 2024-06-03 MED ORDER — VITAMIN K1 10 MG/ML IJ SOLN
10.0000 mg | Freq: Once | INTRAVENOUS | Status: AC
  Administered 2024-06-03: 10 mg via INTRAVENOUS
  Filled 2024-06-03: qty 1

## 2024-06-03 MED ORDER — MIDODRINE HCL 5 MG PO TABS
10.0000 mg | ORAL_TABLET | Freq: Three times a day (TID) | ORAL | Status: DC
  Administered 2024-06-03 – 2024-06-05 (×6): 10 mg via ORAL
  Filled 2024-06-03 (×6): qty 2

## 2024-06-03 NOTE — TOC Initial Note (Signed)
 Transition of Care [facility]) - Initial/Assessment Note    Patient Details  Name: Kevin Gross MRN: [id number] Date of Birth: [birth year]  Transition [facility]) CM/SW Contact:    Lauraine JAYSON Carpen, LCSW Phone Number: 06/03/2024, 11:29 AM  Clinical Narrative:   Readmission prevention screen was completed by TOC on 9/18:  Chart reviewed. The patient was admitted for shock circulatory. I spoke with the patient at bedside today. I introduced myself, my role and reason for consult.The patient reports that his doing well today. The patient reports that he has a PCP. The patient reports that he lives with his wife, daughter, Wanda, and daughters boyfriend, Camellia. The patient reports that he needed assistance around the home. The patient reports that his daughter or the daughter boyfriend would help him with daily living task. The patient reports that his wife is bed bound. The patient reports that his daughter boyfriend would drive him to medical appointments. The patient reports that the daughter boyfriend will assist him during discharge. The patient reports that he uses [facility] for a pharmacy. The patient reports that he was active with [facility] 2 years ago. The patient reports that he has never been admitted into a SNF. The patient reports that he has a cane, walker, and BSC in the home.    The patient reports no other TOC needs at this time. TOC will follow the patient until discharge.  Patient was set up with [facility] for PT and OT prior to admission. Liaison confirmed they are still active. No further concerns. CSW will continue to follow patient for support and facilitate return home once stable.               Expected Discharge Plan: Home w Home Health Services Barriers to Discharge: Continued Medical Work up   Patient Goals and CMS Choice            Expected Discharge Plan and Services       Living arrangements for the past 2 months: Single  Family Home                           HH Arranged: PT, OT HH Agency: [facility] Date [facility] Agency Contacted: 06/03/24   Representative spoke with at [facility] Agency: Leotis  Prior Living Arrangements/Services Living arrangements for the past 2 months: Single Family Home Lives with:: Spouse, Adult Children Patient language and need for interpreter reviewed:: Yes        Need for Family Participation in Patient Care: Yes (Comment) Care giver support system in place?: Yes (comment) Current home services: Home OT, Home PT, DME Criminal Activity/Legal Involvement Pertinent to Current Situation/Hospitalization: No - Comment as needed  Activities of Daily Living      Permission Sought/Granted                  Emotional Assessment       Orientation: : Oriented to Self, Oriented to Place, Oriented to  Time, Oriented to Situation Alcohol / Substance Use: Not Applicable Psych Involvement: No (comment)  Admission diagnosis:  Septic shock (HCC) [A41.9, R65.21] Patient Active Problem List   Diagnosis Date Noted   Melena 05/31/2024   Septic shock (HCC) 05/29/2024   Encounter for dialysis and dialysis catheter care 05/22/2024   Generalized weakness 05/20/2024   Diarrhea 05/19/2024   Portal vein thrombosis 05/19/2024   Community acquired pneumonia of right lower lobe of lung 05/17/2024  Shock circulatory (HCC) 05/15/2024   ABLA (acute blood loss anemia) 03/08/2024   Acute on chronic diastolic CHF (congestive heart failure) (HCC) 03/08/2024   Hyperlipidemia 03/08/2024   Paroxysmal atrial fibrillation (HCC) 03/08/2024   Iron  deficiency anemia 07/17/2023   Symptomatic anemia 09/09/2022   Anemia 12/19/2021   Alcoholic cirrhosis of liver with ascites (HCC) 12/13/2021   Pre-diabetes 12/13/2021   OSA (obstructive sleep apnea) 12/13/2021   Hypertension 12/13/2021   Wound infection after surgery 12/12/2021   PAF (paroxysmal atrial fibrillation) (HCC)    Atherosclerosis of  artery of extremity with intermittent claudication 11/26/2021   Peripheral arterial disease 11/26/2021   Chronic gouty arthritis 04/16/2019   Encounter for long-term (current) use of high-risk medication 04/16/2019   Stage 3b chronic kidney disease (CKD) (HCC) 03/05/2019   Hyperuricemia 03/05/2019   Cellulitis of left lower extremity 02/18/2019   Aortic atherosclerosis 04/10/2018   Class 3 obesity (HCC) 04/10/2018   CAD (coronary artery disease), native coronary artery 04/08/2018   COPD (chronic obstructive pulmonary disease) (HCC) 04/08/2018   Acute renal failure 04/08/2018   Hx of adenomatous colonic polyps 08/29/2016   PCP:  Autry Grayce LABOR, PA Pharmacy:   [facility] - [place], [place] - [address] [place] [address] Phone: [phone] Fax: [phone]  [facility] [address] [place] [address] Phone: [phone] Fax: [phone]     Social Drivers of Health (SDOH) Social History: SDOH Screenings   Food Insecurity: No Food Insecurity (05/29/2024)  Housing: Low Risk  (05/29/2024)  Transportation Needs: No Transportation Needs (05/29/2024)  Utilities: Not At Risk (05/29/2024)  Depression (PHQ2-9): Low Risk  (03/14/2024)  Financial Resource Strain: Low Risk  (07/17/2023)  Social Connections: Moderately Isolated (05/29/2024)  Tobacco Use: Medium Risk (05/29/2024)   SDOH Interventions:     Readmission Risk Interventions    06/03/2024   11:26 AM 05/16/2024    1:46 PM 09/13/2022   11:09 AM  Readmission Risk Prevention Plan  Transportation Screening Complete Complete Complete  PCP or Specialist Appt within 3-5 Days  Complete   Home Care Screening   Patient refused  Medication Review (RN CM)   Complete  Palliative Care Screening  Not Applicable   Medication Review (RN Care Manager) Complete Complete   PCP or Specialist appointment within 3-5 days of discharge Complete     HRI or Home Care Consult Complete    SW Recovery Care/Counseling Consult Complete    Palliative Care Screening Not Applicable    Skilled Nursing Facility Not Applicable

## 2024-06-03 NOTE — Progress Notes (Signed)
 [facility]  ROUNDING NOTE   Subjective:   Kevin Gross  is a 68 y.o.  male  with past medical conditions including hypertension, COPD, obstructive sleep apnea with CPAP, anemia, and recent acute kidney injury requiring hemodialysis. Patient presents to ED with decreased blood pressure and has been admitted to ICU for Septic shock (HCC) [A41.9, R65.21]  Patient seen and evaluated at bedside. Alert and oriented.   Update: Patient sitting up in bed in ICU Alert and oriented 2L [place] Pressors: Levo and Vaso, weaning as tolerated  CRRT with net UF -154ml/hr; net neg by 4100 cc last 24 hrs   Objective:  Vital signs in last 24 hours:  Temp:  [97.6 F (36.4 C)-98.3 F (36.8 C)] 97.9 F (36.6 C) (10/06 1100) Pulse Rate:  [51-65] 53 (10/06 1530) Resp:  [11-21] 15 (10/06 1530) BP: (73-127)/(44-90) 113/56 (10/06 1530) SpO2:  [92 %-100 %] 100 % (10/06 1530)  Weight change:  Filed Weights   05/31/24 0500 06/01/24 0413 06/02/24 0500  Weight: 72.6 kg 118.5 kg 115.6 kg    Intake/Output: I/O last 3 completed shifts: In: 1829.8 [P.O.:300; I.V.:1021.9; Other:54; IV Piggyback:454] Out: 6169 [Stool:150]   Intake/Output this shift:  Total I/O In: 361 [I.V.:193.9; IV Piggyback:167.1] Out: 1194   Physical Exam: General: NAD  Head: Normocephalic, atraumatic. Moist oral mucosal membranes  Eyes: Anicteric  Lungs:  Clear to auscultation, normal effort  Heart: Regular rate and rhythm  Abdomen:  Soft, nontender  Extremities: 2-3+ peripheral edema.  Neurologic: Awake, alert, conversant  Skin: Generalized ecchymosis, weeping  Access: Right IJ PermCath    Basic Metabolic Panel: Recent Labs  Lab 05/30/24 0338 05/31/24 0459 05/31/24 1558 06/01/24 0457 06/01/24 1545 06/02/24 0505 06/02/24 1630 06/03/24 0500  NA 138 133*   < > 133* 135 135 135 135  K 4.0 3.9   < > 3.9 3.7 3.9 4.0 4.0  CL 100 98   < > 98 102 100 100 102  CO2 25 22   < > 25 23 24 24 25   GLUCOSE 147*  128*   < > 134* 131* 129* 138* 151*  BUN 74* 60*   < > 44* 40* 32* 29* 30*  CREATININE 6.89* 5.37*   < > 3.49* 2.69* 2.37* 1.97* 1.80*  CALCIUM  7.9* 8.0*   < > 8.0* 8.0* 8.2* 8.2* 8.2*  MG 1.7 2.1  --  2.3  --  2.5*  --  2.5*  PHOS 4.9* 4.0   < > 3.1 3.3 2.8 2.7 3.2   < > = values in this interval not displayed.    Liver Function Tests: Recent Labs  Lab 05/29/24 1256 05/30/24 0338 05/31/24 0459 05/31/24 1558 06/01/24 0457 06/01/24 1545 06/02/24 0505 06/02/24 1630 06/03/24 0500  AST 301* 270* 250*  --   --   --   --   --   --   ALT 90* 91* 94*  --   --   --   --   --   --   ALKPHOS 226* 205* 206*  --   --   --   --   --   --   BILITOT 3.1* 3.6* 5.4*  --   --   --   --   --   --   PROT 4.9* 4.4* 4.6*  --   --   --   --   --   --   ALBUMIN  2.3* 2.0* 2.1*   < > 2.5* 2.9* 2.6* 2.6*  2.9*   < > = values in this interval not displayed.   No results for input(s): LIPASE, AMYLASE in the last 168 hours. No results for input(s): AMMONIA in the last 168 hours.   CBC: Recent Labs  Lab 05/29/24 1256 05/29/24 2040 05/30/24 0338 05/30/24 1400 05/31/24 0638 05/31/24 1451 06/01/24 0457 06/01/24 1125 06/02/24 0505 06/02/24 1227 06/02/24 1630 06/02/24 2359 06/03/24 0500 06/03/24 0503 06/03/24 1135  WBC 21.1* 32.3* 29.9*  --  27.4*  --  23.0*  --  23.0*  --   --   --  28.6*  --   --   NEUTROABS 18.6* 27.5*  --   --   --   --   --   --   --   --   --   --   --   --   --   HGB 7.9* 7.7* 6.6*   < > 6.9*   < > 7.5*   < > 7.3*  7.4*   < > 7.6* 8.0* 8.1* 8.1* 7.8*  HCT 24.9* 24.4* 20.5*   < > 21.0*   < > 22.5*   < > 22.4*  22.5*   < > 23.6* 24.7* 24.9* 24.9* 24.3*  MCV 106.9* 107.0* 105.1*  --  99.1  --  96.6  --  99.6  --   --   --  101.2*  --   --   PLT 117* 180 163  --  116*  --  107*  --  98*  --   --   --  106*  --   --    < > = values in this interval not displayed.    Cardiac Enzymes: No results for input(s): CKTOTAL, CKMB, CKMBINDEX, TROPONINI in the last 168  hours.  BNP: Invalid input(s): POCBNP  CBG: Recent Labs  Lab 05/29/24 1540  GLUCAP 113*     Microbiology: Results for orders placed or performed during the hospital encounter of 05/29/24  Resp panel by RT-PCR (RSV, Flu A&B, Covid) Anterior Nasal Swab     Status: None   Collection Time: 05/29/24 12:56 PM   Specimen: Anterior Nasal Swab  Result Value Ref Range Status   SARS Coronavirus 2 by RT PCR NEGATIVE NEGATIVE Final    Comment: (NOTE) SARS-CoV-2 target nucleic acids are NOT DETECTED.  The SARS-CoV-2 RNA is generally detectable in upper respiratory specimens during the acute phase of infection. The lowest concentration of SARS-CoV-2 viral copies this assay can detect is 138 copies/mL. A negative result does not preclude SARS-Cov-2 infection and should not be used as the sole basis for treatment or other patient management decisions. A negative result may occur with  improper specimen collection/handling, submission of specimen other than nasopharyngeal swab, presence of viral mutation(s) within the areas targeted by this assay, and inadequate number of viral copies(<138 copies/mL). A negative result must be combined with clinical observations, patient history, and epidemiological information. The expected result is Negative.  Fact Sheet for Patients:  BloggerCourse.com  Fact Sheet for Healthcare Providers:  SeriousBroker.it  This test is no t yet approved or cleared by the [place]  FDA and  has been authorized for detection and/or diagnosis of SARS-CoV-2 by FDA under an Emergency Use Authorization (EUA). This EUA will remain  in effect (meaning this test can be used) for the duration of the COVID-19 declaration under Section 564(b)(1) of the Act, 21 U.S.C.section 360bbb-3(b)(1), unless the authorization is terminated  or revoked sooner.  Influenza A by PCR NEGATIVE NEGATIVE Final   Influenza B by PCR  NEGATIVE NEGATIVE Final    Comment: (NOTE) The Xpert Xpress SARS-CoV-2/FLU/RSV plus assay is intended as an aid in the diagnosis of influenza from Nasopharyngeal swab specimens and should not be used as a sole basis for treatment. Nasal washings and aspirates are unacceptable for Xpert Xpress SARS-CoV-2/FLU/RSV testing.  Fact Sheet for Patients: BloggerCourse.com  Fact Sheet for Healthcare Providers: SeriousBroker.it  This test is not yet approved or cleared by the [place]  FDA and has been authorized for detection and/or diagnosis of SARS-CoV-2 by FDA under an Emergency Use Authorization (EUA). This EUA will remain in effect (meaning this test can be used) for the duration of the COVID-19 declaration under Section 564(b)(1) of the Act, 21 U.S.C. section 360bbb-3(b)(1), unless the authorization is terminated or revoked.     Resp Syncytial Virus by PCR NEGATIVE NEGATIVE Final    Comment: (NOTE) Fact Sheet for Patients: BloggerCourse.com  Fact Sheet for Healthcare Providers: SeriousBroker.it  This test is not yet approved or cleared by the [place]  FDA and has been authorized for detection and/or diagnosis of SARS-CoV-2 by FDA under an Emergency Use Authorization (EUA). This EUA will remain in effect (meaning this test can be used) for the duration of the COVID-19 declaration under Section 564(b)(1) of the Act, 21 U.S.C. section 360bbb-3(b)(1), unless the authorization is terminated or revoked.  Performed at [facility], [address] [place], [place] [address]   Blood culture (routine x 2)     Status: None   Collection Time: 05/29/24 12:56 PM   Specimen: BLOOD  Result Value Ref Range Status   Specimen Description BLOOD RIGHT ANTECUBITAL  Final   Special Requests   Final    BOTTLES DRAWN AEROBIC AND ANAEROBIC Blood Culture results may not be optimal  due to an inadequate volume of blood received in culture bottles   Culture   Final    NO GROWTH 5 DAYS Performed at [facility], [address] [place], [place] [address]    Report Status 06/03/2024 FINAL  Final  Blood culture (routine x 2)     Status: None   Collection Time: 05/29/24 12:56 PM   Specimen: BLOOD  Result Value Ref Range Status   Specimen Description BLOOD LEFT ANTECUBITAL  Final   Special Requests   Final    BOTTLES DRAWN AEROBIC AND ANAEROBIC Blood Culture results may not be optimal due to an inadequate volume of blood received in culture bottles   Culture   Final    NO GROWTH 5 DAYS Performed at [facility], [address] [place], [place] [address]    Report Status 06/03/2024 FINAL  Final  MRSA Next Gen by PCR, Nasal     Status: None   Collection Time: 05/29/24  2:58 PM   Specimen: Nasal Mucosa; Nasal Swab  Result Value Ref Range Status   MRSA by PCR Next Gen NOT DETECTED NOT DETECTED Final    Comment: (NOTE) The GeneXpert MRSA Assay (FDA approved for NASAL specimens only), is one component of a comprehensive MRSA colonization surveillance program. It is not intended to diagnose MRSA infection nor to guide or monitor treatment for MRSA infections. Test performance is not FDA approved in patients less than 94 years old. Performed at [facility], [address] [place], [place] [address]   Group A Strep by PCR     Status: None   Collection Time: 05/29/24  2:58  PM   Specimen: Throat; Sterile Swab  Result Value Ref Range Status   Group A Strep by PCR NOT DETECTED NOT DETECTED Final    Comment: Performed at [facility], [address] [place], [place] [address]  Respiratory (~20 pathogens) panel by PCR     Status: None   Collection Time: 05/29/24  3:00 PM  Result Value Ref Range Status   Adenovirus NOT DETECTED NOT DETECTED Final   Coronavirus 229E NOT DETECTED NOT DETECTED Final    Comment: (NOTE) The  Coronavirus on the Respiratory Panel, DOES NOT test for the novel  Coronavirus (2019 nCoV)    Coronavirus HKU1 NOT DETECTED NOT DETECTED Final   Coronavirus NL63 NOT DETECTED NOT DETECTED Final   Coronavirus OC43 NOT DETECTED NOT DETECTED Final   Metapneumovirus NOT DETECTED NOT DETECTED Final   Rhinovirus / Enterovirus NOT DETECTED NOT DETECTED Final   Influenza A NOT DETECTED NOT DETECTED Final   Influenza B NOT DETECTED NOT DETECTED Final   Parainfluenza Virus 1 NOT DETECTED NOT DETECTED Final   Parainfluenza Virus 2 NOT DETECTED NOT DETECTED Final   Parainfluenza Virus 3 NOT DETECTED NOT DETECTED Final   Parainfluenza Virus 4 NOT DETECTED NOT DETECTED Final   Respiratory Syncytial Virus NOT DETECTED NOT DETECTED Final   Bordetella pertussis NOT DETECTED NOT DETECTED Final   Bordetella Parapertussis NOT DETECTED NOT DETECTED Final   Chlamydophila pneumoniae NOT DETECTED NOT DETECTED Final   Mycoplasma pneumoniae NOT DETECTED NOT DETECTED Final    Comment: Performed at [facility], [address] [place], [place] [address]  MRSA Next Gen by PCR, Nasal     Status: None   Collection Time: 05/29/24  3:50 PM   Specimen: Nasal Mucosa; Nasal Swab  Result Value Ref Range Status   MRSA by PCR Next Gen NOT DETECTED NOT DETECTED Final    Comment: (NOTE) The GeneXpert MRSA Assay (FDA approved for NASAL specimens only), is one component of a comprehensive MRSA colonization surveillance program. It is not intended to diagnose MRSA infection nor to guide or monitor treatment for MRSA infections. Test performance is not FDA approved in patients less than 54 years old. Performed at [facility], [address] [place], [place] [address]     Coagulation Studies: Recent Labs    06/01/24 0457 06/02/24 0505 06/03/24 0500  LABPROT 36.9* 27.6* 23.9*  INR 3.5* 2.4* 2.0*     Urinalysis: No results for input(s): COLORURINE, LABSPEC, PHURINE, GLUCOSEU, HGBUR,  BILIRUBINUR, KETONESUR, PROTEINUR, UROBILINOGEN, NITRITE, LEUKOCYTESUR in the last 72 hours.  Invalid input(s): APPERANCEUR     Imaging: No results found.      Medications:    sodium chloride      albumin  human Stopped (06/03/24 1318)   norepinephrine  (LEVOPHED ) Adult infusion 12 mcg/min (06/03/24 1500)   piperacillin -tazobactam Stopped (06/03/24 1346)   prismasol BGK 4/2.5 400 mL/hr at 06/03/24 1501   prismasol BGK 4/2.5 400 mL/hr at 06/03/24 1501   prismasol BGK 4/2.5 1,500 mL/hr at 06/03/24 1501   vasopressin  0.04 Units/min (06/03/24 1500)     vitamin C   500 mg Oral BID   Chlorhexidine  Gluconate Cloth  6 each Topical Daily   epoetin  alfa-epbx (RETACRIT ) injection  10,000 Units Subcutaneous Q T,Th,Sat-1800   feeding supplement (NEPRO CARB STEADY)  237 mL Oral TID BM   hydrocortisone sod succinate (SOLU-CORTEF) inj  50 mg Intravenous Q6H   midodrine   10 mg Oral TID WC   multivitamin  1 tablet Oral QHS  pantoprazole  (PROTONIX ) IV  40 mg Intravenous Q12H   thiamine   100 mg Oral Daily   alteplase , alteplase , docusate sodium , heparin , heparin , heparin , mouth rinse, polyethylene glycol, zolpidem   Assessment/ Plan:  [name] is a 68 y.o.  male  with past medical conditions including hypertension, COPD, obstructive sleep apnea with CPAP, anemia, and chronic kidney disease stage IV, who was admitted to [facility] on 05/29/2024 for Septic shock (HCC) [A41.9, R65.21]  [facility]/TTS/RT permcath  Acute kidney injury on chronic kidney disease stage IV. Baseline creatinine 2.77 with GFR 24 06/30/2024. CT abd/pelvis shows dense lesion on left kidney. Tolerating CRRT with net neg of 4.1 L yesterday. Will continue net neg of 125 ml/hr today. Continue as tolerated.      Lab Results  Component Value Date   CREATININE 1.80 (H) 06/03/2024   CREATININE 1.97 (H) 06/02/2024   CREATININE 2.37 (H) 06/02/2024    Intake/Output Summary (Last 24 hours) at 06/03/2024  1553 Last data filed at 06/03/2024 1500 Gross per 24 hour  Intake 1229.29 ml  Output 4177 ml  Net -2947.71 ml   2. Hypotension likely due to shock secondary to liver cirrhosis.  Patient remains on norepinephrine  and vasopressin , managed by primary team. Will wean as tolerated.  3. Anemia of chronic kidney disease Lab Results  Component Value Date   HGB 7.8 (L) 06/03/2024  Patient has received multiple blood products during this admission.  Continue Retacrit  10000 units  subcu injections T/T/S.  4.  Secondary Hyperparathyroidism: with outpatient labs: PTH 52.7, phosphorus 4.1, calcium  8.6 on 7/2.  Lab Results  Component Value Date   CALCIUM  8.2 (L) 06/03/2024   CAION 1.21 08/08/2023   PHOS 3.2 06/03/2024   Monitoring bone minerals during this admission.    LOS: 5 Norissa Bartee 10/6/20253:53 PM

## 2024-06-03 NOTE — Plan of Care (Signed)
 Patient alert and oriented. No complaints of pain. Patients CVP this evening -2, Dr Parris notified (CRRT pulling 125 ml/hr). Per MD no additional orders at this time. Midodrine  started today. Continue to assess.

## 2024-06-03 NOTE — Progress Notes (Signed)
 NAME:  RITCHARD PARAGAS, MRN:  [id number], DOB:  1956-04-30, LOS: 5 ADMISSION DATE:  05/29/2024, CONSULTATION DATE:  05/29/2024 REFERRING MD:  Dr. Claudene, CHIEF COMPLAINT:  Generalized Weakness & Hypotension    Brief Pt Description / Synopsis:  68 y.o. male with PMHx significant for ESRD on HD, HFpEF, COPD, OSA, Alcoholic Cirrhosis, and portal vein thrombosis, who is admitted with Multifactorial shock (hypovolemic/hemorrhagic vs septic), Sepsis of unknown etiology (questionable LLE cellulitis vs Pneumonia vs SBP), Acute GI Bleed, and Acute Decompensated Cirrhosis.   History of Present Illness:  Mr. Defenbaugh is a 68 y.o. male who presented to [facility] ED via EMS from his home to be evaluated for hypotension and weakness. He reports his initial outpatient hemodialysis appointment was 05/28/2024 at [facility]. This initial appointment is following a recent admission at [facility], of which he was discharged on 05/24/24.   His overall weakness began yesterday following his dialysis appointment and has continued today, with the addition of upper URI symptoms to include minor congestion and a sore throat as of this morning. Upon EMS arrival, he was found to have a BP of 64/28, to which he received fluids and BP increased to 94/42. He reported a sore throat and generalized fatigue/weakness that resulted in a fall this morning. He also stated at baseline he has no issue ambulating with a walker.   Pertinent  Medical History  ETOH Hypertensin Hyperlipidemia Congestive Heart Failure End Stage Renal Disease  Micro Data:  05/29/24: Blood cultures x2 >> no growth to date  05/29/24: MRSA PCR >> negative 05/29/24: Group A Strep PCR >> negative 05/29/24: COVID/FLU/RSV PCR >> negative 05/29/24: Legionella Urine Antigen >> 05/29/24: Strep Pneumo Urine Antigen >> 05/30/24: Viral Respiratory Panel >> negative   Antimicrobials:   Anti-infectives (From admission, onward)    Start     Dose/Rate Route Frequency Ordered  Stop   05/31/24 1800  piperacillin -tazobactam (ZOSYN ) IVPB 3.375 g  Status:  Discontinued        3.375 g 12.5 mL/hr over 240 Minutes Intravenous Every 6 hours 05/31/24 1300 05/31/24 1305   05/31/24 1400  piperacillin -tazobactam (ZOSYN ) IVPB 3.375 g        3.375 g 100 mL/hr over 30 Minutes Intravenous Every 6 hours 05/31/24 1306 06/07/24 1159   05/29/24 2200  piperacillin -tazobactam (ZOSYN ) IVPB 2.25 g  Status:  Discontinued        2.25 g 100 mL/hr over 30 Minutes Intravenous Every 8 hours 05/29/24 1752 05/31/24 1300   05/29/24 1400  piperacillin -tazobactam (ZOSYN ) IVPB 3.375 g        3.375 g 100 mL/hr over 30 Minutes Intravenous  Once 05/29/24 1353 05/29/24 1432   05/29/24 1400  vancomycin  (VANCOREADY) IVPB 2000 mg/400 mL        2,000 mg 200 mL/hr over 120 Minutes Intravenous  Once 05/29/24 1353 05/29/24 1600       Significant Hospital Events: Including procedures, antibiotic start and stop dates in addition to other pertinent events   05/29/24: Started on Levophed  in the ED following refractory hypotension. Midodrine  10mg  x 1. 1.5L fluids given without improvement. Transferred to the ICU for further management. 2L [place] started with improvement in O2.  05/30/24: Remains on levophed  and started on vaso overnight. Minimal oxygen requirements. Oliguric with plan for HD today at 11.  Abdominal Arterial Ultrasound with patent portal vein and hepatopetal flow (PORTAL VEIN THROMBOSIS RESOLVED), notes cirrhosis and moderate to large ascites.  05/31/24: No significant events overnight, nursing noted several small episodes of  melena.  Hgb down to 6.9 this am, giving 1 unit pRBCs.  Remains on Levophed  and Vasopressin , start stress dose steroids and give Albumin .  Ultrasound yesterday negative for portal vein thrombosis, will give Vitamin K  for supratherapeutic INR.  Nephrology starting CRRT.  10/4 shock and on pressors 10/5 remains on pressors and CRRT  Interim History / Subjective:  Remains critically  ill End stage liver cirrhosis and end stage kidney disease   Objective   Blood pressure (!) 107/54, pulse (!) 56, temperature 97.6 F (36.4 C), resp. rate 13, height 5' 9 (1.753 m), weight 115.6 kg, SpO2 100%. CVP:  [0 mmHg-70 mmHg] 6 mmHg      Intake/Output Summary (Last 24 hours) at 06/03/2024 0806 Last data filed at 06/03/2024 0700 Gross per 24 hour  Intake 1112.48 ml  Output 4108 ml  Net -2995.52 ml   Filed Weights   05/31/24 0500 06/01/24 0413 06/02/24 0500  Weight: 72.6 kg 118.5 kg 115.6 kg     Physical Examination:   General Appearance: No distress  EYES PERRLA, EOM intact.  Icteric sclera NECK Supple, No JVD Pulmonary: normal breath sounds, No wheezing.  CardiovascularNormal S1,S2.  No m/r/g.   Abdomen: Benign, Soft, non-tender. Neurology UE/LE 5/5 strength, no focal deficits Ext pulses intact, cap refill intact +edema ALL OTHER ROS ARE NEGATIVE     Assessment & Plan:  68 yo morbidly obese male admitted for decompensated liver cirrhosis and failure with progressive end stage kidney failure With severe shock hypovolemic/Hemorrhagic vs Septic exacerbated by Decompensated Liver Cirrhosis    Shock: HFpEF without acute exacerbation  Mildly Elevated Troponin due to demand ischemia  PMHx: Bradycardia, HTN, HLD, Paroxsymal A. Fib, prolonged QT -Echocardiogram 05/16/24: LVEF 60-65%, mild LVH, normal diastolic parameters, RV systolic function normal, RV size normal, normal pulmonary artery systolic pressure  -Continuous cardiac monitoring -Vasopressors as needed to maintain MAP goal -HS Troponin peaked at 101 -Coox 75.9  -Volume removal with renal replacement therapy  Sepsis of Unknown Etiology Questionable Cellulitis of LLE vs Pneumonia vs Spontaneous Bacterial Peritonitis  -Follow cultures as above -Continue empiric Zosyn  pending cultures & sensitivities  ESRD on Hemodialysis  Anion Gap Metabolic Acidosis  -Monitor I&O's / urinary output -Follow BMP -Ensure  adequate renal perfusion -Avoid nephrotoxic agents as able -CRRT as per Nephrology   Suspected Acute GI Bleed superimposed on Chronic Iron  Deficiency Anemia Thrombocytopenia, suspect due to cirrhosis  Supratherapeutic INR now 2.4 -SCD's for VTE Prophylaxis  -Transfuse for Hgb <7 ~ status post 1 unit of pRBCs and 1 FFP ~ plan for 1 unit pRBC on 10/3 -Transfuse Platelets for platelet count <10K; <50K with active bleeding; <100K for Neurosurgical procedures -Vitamin K  10/3  Alcoholic Liver Cirrhosis Acute Decompensated Liver Cirrhosis Transaminitis Hypoalbuminemia Portal Vein Thrombosis (Identified during previous admission) ~ RESOLVED  Hx: ETOH abuse -05/29/24 CT Chest/Abdomen/Pelvis: small bilateral pleural effusions, liver cirrhosis, distended gallbladder with small stones, exophytic lesions of left kidney, aortic atherosclerosis. -05/30/24 Abdominal Pelvic Arterial/Venous Flow Doppler: shows resolution of previous portal vein thrombus, also notes cirrhosis and moderate to large volume of ascites  -Continue Protonix  BID -Holding Eliquis  currently until active GI Bleed ruled out -GI following, appreciate input  No need for paracentesis at this time    OVERALL POOR PROGNOSIS PATIENT WITH MULTIORGAN FAILURE AND END STAGE DISEASE I DO NOT THINK PATIENT WILL BE GOING HOME     Best Practice (right click and Reselect all SmartList Selections daily)   Diet/type: Regular consistency (see orders) DVT prophylaxis: SCD  GI prophylaxis: PPI Lines: Central line, Dialysis Catheter, and yes and it is still needed Foley:  Yes, and it is still needed Code Status:  full code Last date of multidisciplinary goals of care discussion [10/3]  10/3: Pt and family updated at bedside on plan of care.  Labs   CBC: Recent Labs  Lab 05/29/24 1256 05/29/24 2040 05/30/24 0338 05/30/24 1400 05/31/24 0638 05/31/24 1451 06/01/24 0457 06/01/24 1125 06/02/24 0505 06/02/24 1227 06/02/24 1630  06/02/24 2359 06/03/24 0500 06/03/24 0503  WBC 21.1* 32.3* 29.9*  --  27.4*  --  23.0*  --  23.0*  --   --   --  28.6*  --   NEUTROABS 18.6* 27.5*  --   --   --   --   --   --   --   --   --   --   --   --   HGB 7.9* 7.7* 6.6*   < > 6.9*   < > 7.5*   < > 7.3*  7.4* 7.3* 7.6* 8.0* 8.1* 8.1*  HCT 24.9* 24.4* 20.5*   < > 21.0*   < > 22.5*   < > 22.4*  22.5* 22.2* 23.6* 24.7* 24.9* 24.9*  MCV 106.9* 107.0* 105.1*  --  99.1  --  96.6  --  99.6  --   --   --  101.2*  --   PLT 117* 180 163  --  116*  --  107*  --  98*  --   --   --  106*  --    < > = values in this interval not displayed.    Basic Metabolic Panel: Recent Labs  Lab 05/30/24 0338 05/31/24 0459 05/31/24 1558 06/01/24 0457 06/01/24 1545 06/02/24 0505 06/02/24 1630 06/03/24 0500  NA 138 133*   < > 133* 135 135 135 135  K 4.0 3.9   < > 3.9 3.7 3.9 4.0 4.0  CL 100 98   < > 98 102 100 100 102  CO2 25 22   < > 25 23 24 24 25   GLUCOSE 147* 128*   < > 134* 131* 129* 138* 151*  BUN 74* 60*   < > 44* 40* 32* 29* 30*  CREATININE 6.89* 5.37*   < > 3.49* 2.69* 2.37* 1.97* 1.80*  CALCIUM  7.9* 8.0*   < > 8.0* 8.0* 8.2* 8.2* 8.2*  MG 1.7 2.1  --  2.3  --  2.5*  --  2.5*  PHOS 4.9* 4.0   < > 3.1 3.3 2.8 2.7 3.2   < > = values in this interval not displayed.   GFR: Estimated Creatinine Clearance: 50.6 mL/min (A) (by C-G formula based on SCr of 1.8 mg/dL (H)). Recent Labs  Lab 05/29/24 1256 05/29/24 1458 05/31/24 0638 05/31/24 1028 05/31/24 1451 06/01/24 0457 06/01/24 1125 06/01/24 1410 06/02/24 0505 06/03/24 0500  PROCALCITON 2.62  --   --   --   --   --   --   --   --   --   WBC 21.1*   < > 27.4*  --   --  23.0*  --   --  23.0* 28.6*  LATICACIDVEN 5.8*   < >  --  2.7* 2.6*  --  2.0* 2.0*  --   --    < > = values in this interval not displayed.    Liver Function Tests: Recent Labs  Lab 05/29/24 1256 05/30/24 0338 05/31/24 0459 05/31/24 1558  06/01/24 0457 06/01/24 1545 06/02/24 0505 06/02/24 1630 06/03/24 0500   AST 301* 270* 250*  --   --   --   --   --   --   ALT 90* 91* 94*  --   --   --   --   --   --   ALKPHOS 226* 205* 206*  --   --   --   --   --   --   BILITOT 3.1* 3.6* 5.4*  --   --   --   --   --   --   PROT 4.9* 4.4* 4.6*  --   --   --   --   --   --   ALBUMIN  2.3* 2.0* 2.1*   < > 2.5* 2.9* 2.6* 2.6* 2.9*   < > = values in this interval not displayed.   No results for input(s): LIPASE, AMYLASE in the last 168 hours. No results for input(s): AMMONIA in the last 168 hours.  ABG    Component Value Date/Time   PHART 7.298 (L) 11/27/2021 0345   PCO2ART 39.7 11/27/2021 0345   PO2ART 116 (H) 11/27/2021 0345   HCO3 21.9 05/29/2024 2040   TCO2 21 (L) 08/08/2023 0916   ACIDBASEDEF 2.8 (H) 05/29/2024 2040   O2SAT 73.5 06/01/2024 1125     Coagulation Profile: Recent Labs  Lab 05/30/24 0338 05/31/24 0459 06/01/24 0457 06/02/24 0505 06/03/24 0500  INR 7.9* 4.9* 3.5* 2.4* 2.0*    Cardiac Enzymes: No results for input(s): CKTOTAL, CKMB, CKMBINDEX, TROPONINI in the last 168 hours.  HbA1C: Hgb A1c MFr Bld  Date/Time Value Ref Range Status  11/27/2021 03:28 AM 5.6 4.8 - 5.6 % Final    Comment:    (NOTE) Pre diabetes:          5.7%-6.4%  Diabetes:              >6.4%  Glycemic control for   <7.0% adults with diabetes     CBG: Recent Labs  Lab 05/29/24 1540  GLUCAP 113*      DVT/GI PRX  assessed I Assessed the need for Labs I Assessed the need for Foley I Assessed the need for Central Venous Line Family Discussion when available I Assessed the need for Mobilization I made an Assessment of medications to be adjusted accordingly Safety Risk assessment completed   Critical care provider statement:   Total critical care time: 33 minutes   Performed by: Parris MD   Critical care time was exclusive of separately billable procedures and treating other patients.   Critical care was necessary to treat or prevent imminent or life-threatening  deterioration.   Critical care was time spent personally by me on the following activities: development of treatment plan with patient and/or surrogate as well as nursing, discussions with consultants, evaluation of patient's response to treatment, examination of patient, obtaining history from patient or surrogate, ordering and performing treatments and interventions, ordering and review of laboratory studies, ordering and review of radiographic studies, pulse oximetry and re-evaluation of patient's condition.    Marlyn Rabine, M.D.  Pulmonary & Critical Care Medicine

## 2024-06-03 NOTE — Progress Notes (Signed)
 Kevin Copping, MD [facility]   [address] [place], [place] [address] Phone: [phone] Fax : [phone]   Subjective: The patient's INR today is 2.0 and he has no complaints today.  He is presently having dialysis.  The patient's hemoglobin has remained relatively stable between 7.4 and 8.1 with the last 3 values being 8.1, 8.1 and 7.8.   Objective: Vital signs in last 24 hours: Vitals:   06/03/24 1015 06/03/24 1030 06/03/24 1045 06/03/24 1100  BP: (!) 109/46 (!) 92/44 (!) 89/44 (!) 99/44  Pulse: (!) 54 (!) 58 (!) 55 (!) 56  Resp: 13 13 12 11   Temp:      TempSrc:      SpO2: 99% 100% 100% 100%  Weight:      Height:       Weight change:   Intake/Output Summary (Last 24 hours) at 06/03/2024 1145 Last data filed at 06/03/2024 1100 Gross per 24 hour  Intake 1127.73 ml  Output 4266 ml  Net -3138.27 ml     Exam: General: Sitting up in bed in no apparent distress   Lab Results: @LABTEST2 @ Micro Results: Recent Results (from the past 240 hours)  Resp panel by RT-PCR (RSV, Flu A&B, Covid) Anterior Nasal Swab     Status: None   Collection Time: 05/29/24 12:56 PM   Specimen: Anterior Nasal Swab  Result Value Ref Range Status   SARS Coronavirus 2 by RT PCR NEGATIVE NEGATIVE Final    Comment: (NOTE) SARS-CoV-2 target nucleic acids are NOT DETECTED.  The SARS-CoV-2 RNA is generally detectable in upper respiratory specimens during the acute phase of infection. The lowest concentration of SARS-CoV-2 viral copies this assay can detect is 138 copies/mL. A negative result does not preclude SARS-Cov-2 infection and should not be used as the sole basis for treatment or other patient management decisions. A negative result may occur with  improper specimen collection/handling, submission of specimen other than nasopharyngeal swab, presence of viral mutation(s) within the areas targeted by this assay, and inadequate number of viral copies(<138 copies/mL). A negative  result must be combined with clinical observations, patient history, and epidemiological information. The expected result is Negative.  Fact Sheet for Patients:  BloggerCourse.com  Fact Sheet for Healthcare Providers:  SeriousBroker.it  This test is no t yet approved or cleared by the [place]  FDA and  has been authorized for detection and/or diagnosis of SARS-CoV-2 by FDA under an Emergency Use Authorization (EUA). This EUA will remain  in effect (meaning this test can be used) for the duration of the COVID-19 declaration under Section 564(b)(1) of the Act, 21 U.S.C.section 360bbb-3(b)(1), unless the authorization is terminated  or revoked sooner.       Influenza A by PCR NEGATIVE NEGATIVE Final   Influenza B by PCR NEGATIVE NEGATIVE Final    Comment: (NOTE) The Xpert Xpress SARS-CoV-2/FLU/RSV plus assay is intended as an aid in the diagnosis of influenza from Nasopharyngeal swab specimens and should not be used as a sole basis for treatment. Nasal washings and aspirates are unacceptable for Xpert Xpress SARS-CoV-2/FLU/RSV testing.  Fact Sheet for Patients: BloggerCourse.com  Fact Sheet for Healthcare Providers: SeriousBroker.it  This test is not yet approved or cleared by the [place]  FDA and has been authorized for detection and/or diagnosis of SARS-CoV-2 by FDA under an Emergency Use Authorization (EUA). This EUA will remain in effect (meaning this test can be used) for the duration of the COVID-19 declaration under Section 564(b)(1) of the Act,  21 U.S.C. section 360bbb-3(b)(1), unless the authorization is terminated or revoked.     Resp Syncytial Virus by PCR NEGATIVE NEGATIVE Final    Comment: (NOTE) Fact Sheet for Patients: BloggerCourse.com  Fact Sheet for Healthcare Providers: SeriousBroker.it  This  test is not yet approved or cleared by the [place]  FDA and has been authorized for detection and/or diagnosis of SARS-CoV-2 by FDA under an Emergency Use Authorization (EUA). This EUA will remain in effect (meaning this test can be used) for the duration of the COVID-19 declaration under Section 564(b)(1) of the Act, 21 U.S.C. section 360bbb-3(b)(1), unless the authorization is terminated or revoked.  Performed at [facility], [address] [place], [place] [address]   Blood culture (routine x 2)     Status: None   Collection Time: 05/29/24 12:56 PM   Specimen: BLOOD  Result Value Ref Range Status   Specimen Description BLOOD RIGHT ANTECUBITAL  Final   Special Requests   Final    BOTTLES DRAWN AEROBIC AND ANAEROBIC Blood Culture results may not be optimal due to an inadequate volume of blood received in culture bottles   Culture   Final    NO GROWTH 5 DAYS Performed at [facility], [address] [place], [place] [address]    Report Status 06/03/2024 FINAL  Final  Blood culture (routine x 2)     Status: None   Collection Time: 05/29/24 12:56 PM   Specimen: BLOOD  Result Value Ref Range Status   Specimen Description BLOOD LEFT ANTECUBITAL  Final   Special Requests   Final    BOTTLES DRAWN AEROBIC AND ANAEROBIC Blood Culture results may not be optimal due to an inadequate volume of blood received in culture bottles   Culture   Final    NO GROWTH 5 DAYS Performed at [facility], [address] [place], [place] [address]    Report Status 06/03/2024 FINAL  Final  MRSA Next Gen by PCR, Nasal     Status: None   Collection Time: 05/29/24  2:58 PM   Specimen: Nasal Mucosa; Nasal Swab  Result Value Ref Range Status   MRSA by PCR Next Gen NOT DETECTED NOT DETECTED Final    Comment: (NOTE) The GeneXpert MRSA Assay (FDA approved for NASAL specimens only), is one component of a comprehensive MRSA colonization surveillance program. It is not  intended to diagnose MRSA infection nor to guide or monitor treatment for MRSA infections. Test performance is not FDA approved in patients less than 62 years old. Performed at [facility], [address] [place], [place] [address]   Group A Strep by PCR     Status: None   Collection Time: 05/29/24  2:58 PM   Specimen: Throat; Sterile Swab  Result Value Ref Range Status   Group A Strep by PCR NOT DETECTED NOT DETECTED Final    Comment: Performed at [facility], [address] [place], [place] [address]  Respiratory (~20 pathogens) panel by PCR     Status: None   Collection Time: 05/29/24  3:00 PM  Result Value Ref Range Status   Adenovirus NOT DETECTED NOT DETECTED Final   Coronavirus 229E NOT DETECTED NOT DETECTED Final    Comment: (NOTE) The Coronavirus on the Respiratory Panel, DOES NOT test for the novel  Coronavirus (2019 nCoV)    Coronavirus HKU1 NOT DETECTED NOT DETECTED Final   Coronavirus NL63 NOT DETECTED NOT DETECTED Final   Coronavirus OC43 NOT DETECTED NOT DETECTED  Final   Metapneumovirus NOT DETECTED NOT DETECTED Final   Rhinovirus / Enterovirus NOT DETECTED NOT DETECTED Final   Influenza A NOT DETECTED NOT DETECTED Final   Influenza B NOT DETECTED NOT DETECTED Final   Parainfluenza Virus 1 NOT DETECTED NOT DETECTED Final   Parainfluenza Virus 2 NOT DETECTED NOT DETECTED Final   Parainfluenza Virus 3 NOT DETECTED NOT DETECTED Final   Parainfluenza Virus 4 NOT DETECTED NOT DETECTED Final   Respiratory Syncytial Virus NOT DETECTED NOT DETECTED Final   Bordetella pertussis NOT DETECTED NOT DETECTED Final   Bordetella Parapertussis NOT DETECTED NOT DETECTED Final   Chlamydophila pneumoniae NOT DETECTED NOT DETECTED Final   Mycoplasma pneumoniae NOT DETECTED NOT DETECTED Final    Comment: Performed at [facility], [address] [place], [place] [address]  MRSA Next Gen by PCR, Nasal     Status: None   Collection Time: 05/29/24  3:50 PM    Specimen: Nasal Mucosa; Nasal Swab  Result Value Ref Range Status   MRSA by PCR Next Gen NOT DETECTED NOT DETECTED Final    Comment: (NOTE) The GeneXpert MRSA Assay (FDA approved for NASAL specimens only), is one component of a comprehensive MRSA colonization surveillance program. It is not intended to diagnose MRSA infection nor to guide or monitor treatment for MRSA infections. Test performance is not FDA approved in patients less than 24 years old. Performed at [facility], [address] [place], [place] [address]    Studies/Results: No results found. Medications: I have reviewed the patient's current medications. Scheduled Meds:  vitamin C   500 mg Oral BID   Chlorhexidine  Gluconate Cloth  6 each Topical Daily   epoetin  alfa-epbx (RETACRIT ) injection  10,000 Units Subcutaneous Q T,Th,Sat-1800   feeding supplement (NEPRO CARB STEADY)  237 mL Oral TID BM   hydrocortisone sod succinate (SOLU-CORTEF) inj  50 mg Intravenous Q6H   midodrine   10 mg Oral TID WC   multivitamin  1 tablet Oral QHS   pantoprazole  (PROTONIX ) IV  40 mg Intravenous Q12H   thiamine   100 mg Oral Daily   Continuous Infusions:  albumin  human 25 g (06/03/24 1131)   norepinephrine  (LEVOPHED ) Adult infusion 14 mcg/min (06/03/24 1100)   phytonadione  (VITAMIN K ) 10 mg in dextrose  5 % 50 mL IVPB     piperacillin -tazobactam Stopped (06/03/24 0542)   prismasol BGK 4/2.5 400 mL/hr at 06/03/24 0155   prismasol BGK 4/2.5 400 mL/hr at 06/03/24 0252   prismasol BGK 4/2.5 1,500 mL/hr at 06/03/24 0341   vasopressin  0.04 Units/min (06/03/24 1100)   PRN Meds:.alteplase , alteplase , docusate sodium , heparin , heparin , heparin , mouth rinse, polyethylene glycol, zolpidem    Assessment: Principal Problem:   Septic shock (HCC) Active Problems:   Melena    Plan: This patient came in with a history of cirrhosis with iron  deficient anemia with a colonoscopy earlier this year with diverticulosis as well as  internal hemorrhoids.  Patient also had small bowel enteroscopy which showed a clean-based duodenal ulcer.  The patient's INR has been elevated and we have been waiting for it to be below 1.5 prior to proceeding with an EGD.  The patient INR today is 2.0 and we will tentatively plan for an EGD for tomorrow.   LOS: 5 days   Kevin Copping, MD.FACG 06/03/2024, 11:45 AM Pager [phone] 7am-5pm  Check AMION for 5pm -7am coverage and on weekends

## 2024-06-03 NOTE — Progress Notes (Signed)
 This RN returned from lunch, Obed RN changed patients CRRT cartridge and restarted with no complications. Cartridge life expired. Continue to assess.

## 2024-06-03 NOTE — Plan of Care (Addendum)
 A&O patient continues to be on CRRT (Net Hourly Fluid Balance Goal: -125 ml/hr) with no difficulties noted this shift; Levophed  titrated per order MAP goal 65 currently infusing @ 14 mcg/min. And Vasopressin  @ 0.04 units/min. Patient denies pain this shift. O2 is at 2L. PRN Ambien  given last night, patient reported effective this morning. Dressing changed to sacrum. Call light placed within reach and patient instructed to call when assistance is needed. Pt verbalized understanding  Problem: Education: Goal: Knowledge of General Education information will improve Description: Including pain rating scale, medication(s)/side effects and non-pharmacologic comfort measures Outcome: Progressing   Problem: Clinical Measurements: Goal: Diagnostic test results will improve Outcome: Progressing Goal: Respiratory complications will improve Outcome: Progressing Goal: Cardiovascular complication will be avoided Outcome: Progressing   Problem: Nutrition: Goal: Adequate nutrition will be maintained Outcome: Progressing   Problem: Coping: Goal: Level of anxiety will decrease Outcome: Progressing   Problem: Elimination: Goal: Will not experience complications related to bowel motility Outcome: Progressing   Problem: Pain Managment: Goal: General experience of comfort will improve and/or be controlled Outcome: Progressing   Problem: Safety: Goal: Ability to remain free from injury will improve Outcome: Progressing

## 2024-06-04 DIAGNOSIS — K922 Gastrointestinal hemorrhage, unspecified: Secondary | ICD-10-CM

## 2024-06-04 LAB — CBC
HCT: 23.6 % — ABNORMAL LOW (ref 39.0–52.0)
Hemoglobin: 7.4 g/dL — ABNORMAL LOW (ref 13.0–17.0)
MCH: 32.3 pg (ref 26.0–34.0)
MCHC: 31.4 g/dL (ref 30.0–36.0)
MCV: 103.1 fL — ABNORMAL HIGH (ref 80.0–100.0)
Platelets: 81 K/uL — ABNORMAL LOW (ref 150–400)
RBC: 2.29 MIL/uL — ABNORMAL LOW (ref 4.22–5.81)
RDW: 29.2 % — ABNORMAL HIGH (ref 11.5–15.5)
WBC: 21.8 K/uL — ABNORMAL HIGH (ref 4.0–10.5)
nRBC: 0.1 % (ref 0.0–0.2)

## 2024-06-04 LAB — RENAL FUNCTION PANEL
Albumin: 3.1 g/dL — ABNORMAL LOW (ref 3.5–5.0)
Albumin: 2.9 g/dL — ABNORMAL LOW (ref 3.5–5.0)
Anion gap: 8 (ref 5–15)
Anion gap: 9 (ref 5–15)
BUN: 29 mg/dL — ABNORMAL HIGH (ref 8–23)
BUN: 33 mg/dL — ABNORMAL HIGH (ref 8–23)
CO2: 25 mmol/L (ref 22–32)
CO2: 23 mmol/L (ref 22–32)
Calcium: 8.2 mg/dL — ABNORMAL LOW (ref 8.9–10.3)
Calcium: 8.1 mg/dL — ABNORMAL LOW (ref 8.9–10.3)
Chloride: 102 mmol/L (ref 98–111)
Chloride: 101 mmol/L (ref 98–111)
Creatinine, Ser: 1.6 mg/dL — ABNORMAL HIGH (ref 0.61–1.24)
Creatinine, Ser: 1.73 mg/dL — ABNORMAL HIGH (ref 0.61–1.24)
GFR, Estimated: 47 mL/min — ABNORMAL LOW (ref >60)
GFR, Estimated: 43 mL/min — ABNORMAL LOW (ref >60)
Glucose, Bld: 122 mg/dL — ABNORMAL HIGH (ref 70–99)
Glucose, Bld: 157 mg/dL — ABNORMAL HIGH (ref 70–99)
Phosphorus: 2.4 mg/dL — ABNORMAL LOW (ref 2.5–4.6)
Phosphorus: 3.3 mg/dL (ref 2.5–4.6)
Potassium: 4.1 mmol/L (ref 3.5–5.1)
Potassium: 4 mmol/L (ref 3.5–5.1)
Sodium: 135 mmol/L (ref 135–145)
Sodium: 133 mmol/L — ABNORMAL LOW (ref 135–145)

## 2024-06-04 LAB — BPAM FFP
Blood Product Expiration Date: 202510112359
ISSUE DATE / TIME: 202510062359
Unit Type and Rh: 6200

## 2024-06-04 LAB — HEMOGLOBIN AND HEMATOCRIT, BLOOD
HCT: 23.4 % — ABNORMAL LOW (ref 39.0–52.0)
HCT: 24.5 % — ABNORMAL LOW (ref 39.0–52.0)
HCT: 25 % — ABNORMAL LOW (ref 39.0–52.0)
Hemoglobin: 7.4 g/dL — ABNORMAL LOW (ref 13.0–17.0)
Hemoglobin: 7.8 g/dL — ABNORMAL LOW (ref 13.0–17.0)
Hemoglobin: 8.1 g/dL — ABNORMAL LOW (ref 13.0–17.0)

## 2024-06-04 LAB — PREPARE FRESH FROZEN PLASMA

## 2024-06-04 LAB — MAGNESIUM: Magnesium: 2.7 mg/dL — ABNORMAL HIGH (ref 1.7–2.4)

## 2024-06-04 LAB — PROTIME-INR
INR: 1.7 — ABNORMAL HIGH (ref 0.8–1.2)
Prothrombin Time: 21 s — ABNORMAL HIGH (ref 11.4–15.2)

## 2024-06-04 MED ORDER — SODIUM CHLORIDE 0.9% IV SOLUTION
Freq: Once | INTRAVENOUS | Status: AC
Start: 2024-06-04 — End: 2024-06-05

## 2024-06-04 MED ORDER — SODIUM PHOSPHATES 45 MMOLE/15ML IV SOLN
15.0000 mmol | Freq: Once | INTRAVENOUS | Status: AC
  Administered 2024-06-04: 15 mmol via INTRAVENOUS
  Filled 2024-06-04: qty 5

## 2024-06-04 NOTE — Progress Notes (Signed)
 Rogelia Copping, MD [facility]   [address] [place], [place] [address] Phone: [phone] Fax : [phone]   Subjective: The patient's hemoglobin is stable today and his INR is at 1.7.  The patient denies any sign of bleeding abdominal pain nausea vomiting.  The patient is on continuous dialysis at this point.  His vital signs have been although relatively low blood pressure.   Objective: Vital signs in last 24 hours: Vitals:   06/04/24 0630 06/04/24 0633 06/04/24 0642 06/04/24 0645  BP: (!) 90/44 (!) 89/42 (!) 84/39 (!) 115/57  Pulse: (!) 51 (!) 52 (!) 51 (!) 58  Resp: 11 11 11 18   Temp:      TempSrc:      SpO2: 100% 100% 100% 100%  Weight:      Height:       Weight change:   Intake/Output Summary (Last 24 hours) at 06/04/2024 0753 Last data filed at 06/04/2024 0700 Gross per 24 hour  Intake 1275.17 ml  Output 4344 ml  Net -3068.83 ml     Exam: Heart:: Regular rate and rhythm or without murmur or extra heart sounds Lungs: normal and clear to auscultation and percussion Abdomen: soft, nontender, normal bowel sounds   Lab Results: @LABTEST2 @ Micro Results: Recent Results (from the past 240 hours)  Resp panel by RT-PCR (RSV, Flu A&B, Covid) Anterior Nasal Swab     Status: None   Collection Time: 05/29/24 12:56 PM   Specimen: Anterior Nasal Swab  Result Value Ref Range Status   SARS Coronavirus 2 by RT PCR NEGATIVE NEGATIVE Final    Comment: (NOTE) SARS-CoV-2 target nucleic acids are NOT DETECTED.  The SARS-CoV-2 RNA is generally detectable in upper respiratory specimens during the acute phase of infection. The lowest concentration of SARS-CoV-2 viral copies this assay can detect is 138 copies/mL. A negative result does not preclude SARS-Cov-2 infection and should not be used as the sole basis for treatment or other patient management decisions. A negative result may occur with  improper specimen collection/handling, submission of specimen other than  nasopharyngeal swab, presence of viral mutation(s) within the areas targeted by this assay, and inadequate number of viral copies(<138 copies/mL). A negative result must be combined with clinical observations, patient history, and epidemiological information. The expected result is Negative.  Fact Sheet for Patients:  BloggerCourse.com  Fact Sheet for Healthcare Providers:  SeriousBroker.it  This test is no t yet approved or cleared by the [place]  FDA and  has been authorized for detection and/or diagnosis of SARS-CoV-2 by FDA under an Emergency Use Authorization (EUA). This EUA will remain  in effect (meaning this test can be used) for the duration of the COVID-19 declaration under Section 564(b)(1) of the Act, 21 U.S.C.section 360bbb-3(b)(1), unless the authorization is terminated  or revoked sooner.       Influenza A by PCR NEGATIVE NEGATIVE Final   Influenza B by PCR NEGATIVE NEGATIVE Final    Comment: (NOTE) The Xpert Xpress SARS-CoV-2/FLU/RSV plus assay is intended as an aid in the diagnosis of influenza from Nasopharyngeal swab specimens and should not be used as a sole basis for treatment. Nasal washings and aspirates are unacceptable for Xpert Xpress SARS-CoV-2/FLU/RSV testing.  Fact Sheet for Patients: BloggerCourse.com  Fact Sheet for Healthcare Providers: SeriousBroker.it  This test is not yet approved or cleared by the [place]  FDA and has been authorized for detection and/or diagnosis of SARS-CoV-2 by FDA under an Emergency Use Authorization (EUA). This EUA will  remain in effect (meaning this test can be used) for the duration of the COVID-19 declaration under Section 564(b)(1) of the Act, 21 U.S.C. section 360bbb-3(b)(1), unless the authorization is terminated or revoked.     Resp Syncytial Virus by PCR NEGATIVE NEGATIVE Final    Comment:  (NOTE) Fact Sheet for Patients: BloggerCourse.com  Fact Sheet for Healthcare Providers: SeriousBroker.it  This test is not yet approved or cleared by the [place]  FDA and has been authorized for detection and/or diagnosis of SARS-CoV-2 by FDA under an Emergency Use Authorization (EUA). This EUA will remain in effect (meaning this test can be used) for the duration of the COVID-19 declaration under Section 564(b)(1) of the Act, 21 U.S.C. section 360bbb-3(b)(1), unless the authorization is terminated or revoked.  Performed at [facility], [address] [place], [place] [address]   Blood culture (routine x 2)     Status: None   Collection Time: 05/29/24 12:56 PM   Specimen: BLOOD  Result Value Ref Range Status   Specimen Description BLOOD RIGHT ANTECUBITAL  Final   Special Requests   Final    BOTTLES DRAWN AEROBIC AND ANAEROBIC Blood Culture results may not be optimal due to an inadequate volume of blood received in culture bottles   Culture   Final    NO GROWTH 5 DAYS Performed at [facility], [address] [place], [place] [address]    Report Status 06/03/2024 FINAL  Final  Blood culture (routine x 2)     Status: None   Collection Time: 05/29/24 12:56 PM   Specimen: BLOOD  Result Value Ref Range Status   Specimen Description BLOOD LEFT ANTECUBITAL  Final   Special Requests   Final    BOTTLES DRAWN AEROBIC AND ANAEROBIC Blood Culture results may not be optimal due to an inadequate volume of blood received in culture bottles   Culture   Final    NO GROWTH 5 DAYS Performed at [facility], [address], [place], [place] [address]    Report Status 06/03/2024 FINAL  Final  MRSA Next Gen by PCR, Nasal     Status: None   Collection Time: 05/29/24  2:58 PM   Specimen: Nasal Mucosa; Nasal Swab  Result Value Ref Range Status   MRSA by PCR Next Gen NOT DETECTED NOT DETECTED Final    Comment:  (NOTE) The GeneXpert MRSA Assay (FDA approved for NASAL specimens only), is one component of a comprehensive MRSA colonization surveillance program. It is not intended to diagnose MRSA infection nor to guide or monitor treatment for MRSA infections. Test performance is not FDA approved in patients less than 35 years old. Performed at [facility], [address] [place], [place] [address]   Group A Strep by PCR     Status: None   Collection Time: 05/29/24  2:58 PM   Specimen: Throat; Sterile Swab  Result Value Ref Range Status   Group A Strep by PCR NOT DETECTED NOT DETECTED Final    Comment: Performed at [facility], [address] [place], [place] [address]  Respiratory (~20 pathogens) panel by PCR     Status: None   Collection Time: 05/29/24  3:00 PM  Result Value Ref Range Status   Adenovirus NOT DETECTED NOT DETECTED Final   Coronavirus 229E NOT DETECTED NOT DETECTED Final    Comment: (NOTE) The Coronavirus on the Respiratory Panel, DOES NOT test for the novel  Coronavirus (2019 nCoV)    Coronavirus HKU1  NOT DETECTED NOT DETECTED Final   Coronavirus NL63 NOT DETECTED NOT DETECTED Final   Coronavirus OC43 NOT DETECTED NOT DETECTED Final   Metapneumovirus NOT DETECTED NOT DETECTED Final   Rhinovirus / Enterovirus NOT DETECTED NOT DETECTED Final   Influenza A NOT DETECTED NOT DETECTED Final   Influenza B NOT DETECTED NOT DETECTED Final   Parainfluenza Virus 1 NOT DETECTED NOT DETECTED Final   Parainfluenza Virus 2 NOT DETECTED NOT DETECTED Final   Parainfluenza Virus 3 NOT DETECTED NOT DETECTED Final   Parainfluenza Virus 4 NOT DETECTED NOT DETECTED Final   Respiratory Syncytial Virus NOT DETECTED NOT DETECTED Final   Bordetella pertussis NOT DETECTED NOT DETECTED Final   Bordetella Parapertussis NOT DETECTED NOT DETECTED Final   Chlamydophila pneumoniae NOT DETECTED NOT DETECTED Final   Mycoplasma pneumoniae NOT DETECTED NOT DETECTED Final    Comment:  Performed at [facility], [address] [place], [place] [address]  MRSA Next Gen by PCR, Nasal     Status: None   Collection Time: 05/29/24  3:50 PM   Specimen: Nasal Mucosa; Nasal Swab  Result Value Ref Range Status   MRSA by PCR Next Gen NOT DETECTED NOT DETECTED Final    Comment: (NOTE) The GeneXpert MRSA Assay (FDA approved for NASAL specimens only), is one component of a comprehensive MRSA colonization surveillance program. It is not intended to diagnose MRSA infection nor to guide or monitor treatment for MRSA infections. Test performance is not FDA approved in patients less than 77 years old. Performed at [facility], [address] [place], [place] [address]    Studies/Results: No results found. Medications: I have reviewed the patient's current medications. Scheduled Meds:  vitamin C   500 mg Oral BID   Chlorhexidine  Gluconate Cloth  6 each Topical Daily   epoetin  alfa-epbx (RETACRIT ) injection  10,000 Units Subcutaneous Q T,Th,Sat-1800   feeding supplement (NEPRO CARB STEADY)  237 mL Oral TID BM   hydrocortisone sod succinate (SOLU-CORTEF) inj  50 mg Intravenous Q6H   midodrine   10 mg Oral TID WC   multivitamin  1 tablet Oral QHS   pantoprazole  (PROTONIX ) IV  40 mg Intravenous Q12H   thiamine   100 mg Oral Daily   Continuous Infusions:  sodium chloride      norepinephrine  (LEVOPHED ) Adult infusion 10 mcg/min (06/04/24 0700)   piperacillin -tazobactam Stopped (06/04/24 0602)   prismasol BGK 4/2.5 400 mL/hr at 06/04/24 0328   prismasol BGK 4/2.5 400 mL/hr at 06/04/24 0332   prismasol BGK 4/2.5 1,500 mL/hr at 06/04/24 0457   sodium PHOSPHATE  IVPB (in mmol)     vasopressin  0.04 Units/min (06/04/24 0700)   PRN Meds:.alteplase , alteplase , docusate sodium , heparin , heparin , heparin , mouth rinse, polyethylene glycol, zolpidem    Assessment: Principal Problem:   Septic shock (HCC) Active Problems:   Melena    Plan: This patient has a history of  portal vein thrombosis with CHF end-stage renal disease on hemodialysis history of alcohol abuse without drinking for the last 3 years and now with a suspected upper GI bleed.  The patient's INR is still elevated above 1.5 and due to his hypotension, continue dialysis and the increased INR we will hold off 1 more day before proceeding with an upper endoscopy to look for a source of his GI bleeding.  The patient has been explained the plan and agrees with it.   LOS: 6 days   Rogelia Copping, MD.FACG 06/04/2024, 7:53 AM Pager [phone] 7am-5pm  Check AMION for 5pm -7am  coverage and on weekends

## 2024-06-04 NOTE — Progress Notes (Signed)
 PHARMACY CONSULT NOTE - ELECTROLYTES  Pharmacy Consult for Electrolyte Monitoring and Replacement   Recent Labs: Height: 5' 9 (175.3 cm) Weight: 112 kg (246 lb 14.6 oz) IBW/kg (Calculated) : 70.7 Estimated Creatinine Clearance: 56 mL/min (A) (by C-G formula based on SCr of 1.6 mg/dL (H)). Potassium (mmol/L)  Date Value  06/04/2024 4.1   Magnesium  (mg/dL)  Date Value  89/92/7974 2.7 (H)   Calcium  (mg/dL)  Date Value  89/92/7974 8.2 (L)   Albumin  (g/dL)  Date Value  89/92/7974 3.1 (L)   Phosphorus (mg/dL)  Date Value  89/92/7974 2.4 (L)   Sodium (mmol/L)  Date Value  06/04/2024 135  10/12/2021 145 (H)    Assessment  Kevin Gross is a 68 y.o. male presenting with septic shock. PMH significant for Portal vein thrombosis 9/17, CHF, ESRD on HD TTS, ETOH disorder, chronic bradycardia. Pharmacy has been consulted to monitor and replace electrolytes.  Goal of Therapy: Electrolytes WNL  Plan:  Sodium phosphate  15 mmol IV x 1  Check Renal Panel daily at 0500 and 1600 while on CRRT Pharmacy will continue to monitor replace per protocol   Thank you for allowing pharmacy to be a part of this patient's care.   Ransom Blanch PGY-1 Pharmacy Resident  [facility]  06/04/2024 7:28 AM

## 2024-06-04 NOTE — Progress Notes (Signed)
 Home CPAP was brought in, however, CPAP was found to be dirty, this RT attempted to clean but then found mold growing in water  chamber. Patient was educated on importance of maintaining clean CPAP and that we would not be able to put his home CPAP on him in the current condition. Hospital CPAP at bedside. Settings adjusted per patient request. RN at bedside.

## 2024-06-04 NOTE — Progress Notes (Signed)
 NAME:  Kevin Gross, MRN:  [id number], DOB:  Sep 28, 1955, LOS: 6 ADMISSION DATE:  05/29/2024, CONSULTATION DATE:  05/29/2024 REFERRING MD:  Dr. Claudene, CHIEF COMPLAINT:  Generalized Weakness & Hypotension    Brief Pt Description / Synopsis:  68 y.o. male with PMHx significant for ESRD on HD, HFpEF, COPD, OSA, Alcoholic Cirrhosis, and portal vein thrombosis, who is admitted with Multifactorial shock (hypovolemic/hemorrhagic vs septic), Sepsis of unknown etiology (questionable LLE cellulitis vs Pneumonia vs SBP), Acute GI Bleed, and Acute Decompensated Cirrhosis.   History of Present Illness:  Kevin Gross is a 68 y.o. male who presented to [facility] ED via EMS from his home to be evaluated for hypotension and weakness. He reports his initial outpatient hemodialysis appointment was 05/28/2024 at [facility]. This initial appointment is following a recent admission at [facility], of which he was discharged on 05/24/24.   His overall weakness began yesterday following his dialysis appointment and has continued today, with the addition of upper URI symptoms to include minor congestion and a sore throat as of this morning. Upon EMS arrival, he was found to have a BP of 64/28, to which he received fluids and BP increased to 94/42. He reported a sore throat and generalized fatigue/weakness that resulted in a fall this morning. He also stated at baseline he has no issue ambulating with a walker.  06/04/24-patient resting in bed comfortably. He remains hypotensive on vasopressors and dialysis.   Pertinent  Medical History  ETOH Hypertensin Hyperlipidemia Congestive Heart Failure End Stage Renal Disease  Micro Data:  05/29/24: Blood cultures x2 >> no growth to date  05/29/24: MRSA PCR >> negative 05/29/24: Group A Strep PCR >> negative 05/29/24: COVID/FLU/RSV PCR >> negative 05/29/24: Legionella Urine Antigen >> 05/29/24: Strep Pneumo Urine Antigen >> 05/30/24: Viral Respiratory Panel >>  negative   Antimicrobials:   Anti-infectives (From admission, onward)    Start     Dose/Rate Route Frequency Ordered Stop   05/31/24 1800  piperacillin -tazobactam (ZOSYN ) IVPB 3.375 g  Status:  Discontinued        3.375 g 12.5 mL/hr over 240 Minutes Intravenous Every 6 hours 05/31/24 1300 05/31/24 1305   05/31/24 1400  piperacillin -tazobactam (ZOSYN ) IVPB 3.375 g        3.375 g 100 mL/hr over 30 Minutes Intravenous Every 6 hours 05/31/24 1306 06/07/24 1159   05/29/24 2200  piperacillin -tazobactam (ZOSYN ) IVPB 2.25 g  Status:  Discontinued        2.25 g 100 mL/hr over 30 Minutes Intravenous Every 8 hours 05/29/24 1752 05/31/24 1300   05/29/24 1400  piperacillin -tazobactam (ZOSYN ) IVPB 3.375 g        3.375 g 100 mL/hr over 30 Minutes Intravenous  Once 05/29/24 1353 05/29/24 1432   05/29/24 1400  vancomycin  (VANCOREADY) IVPB 2000 mg/400 mL        2,000 mg 200 mL/hr over 120 Minutes Intravenous  Once 05/29/24 1353 05/29/24 1600       Significant Hospital Events: Including procedures, antibiotic start and stop dates in addition to other pertinent events   05/29/24: Started on Levophed  in the ED following refractory hypotension. Midodrine  10mg  x 1. 1.5L fluids given without improvement. Transferred to the ICU for further management. 2L [place] started with improvement in O2.  05/30/24: Remains on levophed  and started on vaso overnight. Minimal oxygen requirements. Oliguric with plan for HD today at 11.  Abdominal Arterial Ultrasound with patent portal vein and hepatopetal flow (PORTAL VEIN THROMBOSIS RESOLVED), notes cirrhosis and moderate to large  ascites.  05/31/24: No significant events overnight, nursing noted several small episodes of melena.  Hgb down to 6.9 this am, giving 1 unit pRBCs.  Remains on Levophed  and Vasopressin , start stress dose steroids and give Albumin .  Ultrasound yesterday negative for portal vein thrombosis, will give Vitamin K  for supratherapeutic INR.  Nephrology starting  CRRT.  10/4 shock and on pressors 10/5 remains on pressors and CRRT  Interim History / Subjective:  Remains critically ill liver cirrhosis and chronic kidney disease   Objective   Blood pressure (!) 115/57, pulse (!) 58, temperature 98.4 F (36.9 C), temperature source Oral, resp. rate 18, height 5' 9 (1.753 m), weight 112 kg, SpO2 100%. CVP:  [2 mmHg-13 mmHg] 12 mmHg      Intake/Output Summary (Last 24 hours) at 06/04/2024 0839 Last data filed at 06/04/2024 0828 Gross per 24 hour  Intake 1272.2 ml  Output 4544 ml  Net -3271.8 ml   Filed Weights   06/01/24 0413 06/02/24 0500 06/04/24 0332  Weight: 118.5 kg 115.6 kg 112 kg     Physical Examination:   General Appearance: No distress  EYES PERRLA, EOM intact.  Icteric sclera NECK Supple, No JVD Pulmonary: normal breath sounds, No wheezing.  CardiovascularNormal S1,S2.  No m/r/g.   Abdomen: Benign, Soft, non-tender. Neurology UE/LE 5/5 strength, no focal deficits Ext pulses intact, cap refill intact +edema ALL OTHER ROS ARE NEGATIVE     Assessment & Plan:  68 yo morbidly obese male admitted for decompensated liver cirrhosis and failure with progressive end stage kidney failure With severe shock hypovolemic/Hemorrhagic vs Septic exacerbated by Decompensated Liver Cirrhosis    Shock: HFpEF without acute exacerbation  Mildly Elevated Troponin due to demand ischemia  PMHx: Bradycardia, HTN, HLD, Paroxsymal A. Fib, prolonged QT -Echocardiogram 05/16/24: LVEF 60-65%, mild LVH, normal diastolic parameters, RV systolic function normal, RV size normal, normal pulmonary artery systolic pressure  -Continuous cardiac monitoring -Vasopressors as needed to maintain MAP goal -Volume removal with renal replacement therapy  Sepsis of Unknown Etiology Questionable Cellulitis of LLE vs Pneumonia vs Spontaneous Bacterial Peritonitis  -Follow cultures as above -Continue empiric Zosyn  pending cultures & sensitivities  ESRD on  Hemodialysis  Anion Gap Metabolic Acidosis  -Monitor I&O's / urinary output -Follow BMP -Ensure adequate renal perfusion -Avoid nephrotoxic agents as able -CRRT as per Nephrology   Suspected Acute GI Bleed superimposed on Chronic Iron  Deficiency Anemia Thrombocytopenia, suspect due to cirrhosis  Supratherapeutic INR now 2.4 -SCD's for VTE Prophylaxis  -Transfuse for Hgb <7 ~ status post 1 unit of pRBCs and 1 FFP ~ plan for 1 unit pRBC on 10/3 -Transfuse Platelets for platelet count <10K; <50K with active bleeding; <100K for Neurosurgical procedures -Vitamin K  10/3  Alcoholic Liver Cirrhosis Acute Decompensated Liver Cirrhosis Transaminitis Hypoalbuminemia Portal Vein Thrombosis (Identified during previous admission) ~ RESOLVED  Hx: ETOH abuse -05/29/24 CT Chest/Abdomen/Pelvis: small bilateral pleural effusions, liver cirrhosis, distended gallbladder with small stones, exophytic lesions of left kidney, aortic atherosclerosis. -05/30/24 Abdominal Pelvic Arterial/Venous Flow Doppler: shows resolution of previous portal vein thrombus, also notes cirrhosis and moderate to large volume of ascites  -Continue Protonix  BID -Holding Eliquis  currently until active GI Bleed ruled out -GI following, appreciate input  No need for paracentesis at this time    OVERALL POOR PROGNOSIS PATIENT WITH MULTIORGAN FAILURE   Best Practice (right click and Reselect all SmartList Selections daily)   Diet/type: Regular consistency (see orders) DVT prophylaxis: SCD GI prophylaxis: PPI Lines: Central line, Dialysis Catheter, and yes  and it is still needed Foley:  Yes, and it is still needed Code Status:  full code DAILY multidisciplinary goals of care      Labs   CBC: Recent Labs  Lab 05/29/24 1256 05/29/24 2040 05/30/24 0338 05/31/24 9361 05/31/24 1451 06/01/24 0457 06/01/24 1125 06/02/24 0505 06/02/24 1227 06/03/24 0500 06/03/24 0503 06/03/24 1135 06/03/24 1737 06/03/24 2339  06/04/24 0517  WBC 21.1* 32.3*   < > 27.4*  --  23.0*  --  23.0*  --  28.6*  --   --   --   --  21.8*  NEUTROABS 18.6* 27.5*  --   --   --   --   --   --   --   --   --   --   --   --   --   HGB 7.9* 7.7*   < > 6.9*   < > 7.5*   < > 7.3*  7.4*   < > 8.1* 8.1* 7.8* 7.6* 7.6* 7.4*  HCT 24.9* 24.4*   < > 21.0*   < > 22.5*   < > 22.4*  22.5*   < > 24.9* 24.9* 24.3* 23.8* 24.1* 23.6*  MCV 106.9* 107.0*   < > 99.1  --  96.6  --  99.6  --  101.2*  --   --   --   --  103.1*  PLT 117* 180   < > 116*  --  107*  --  98*  --  106*  --   --   --   --  81*   < > = values in this interval not displayed.    Basic Metabolic Panel: Recent Labs  Lab 05/31/24 0459 05/31/24 1558 06/01/24 0457 06/01/24 1545 06/02/24 0505 06/02/24 1630 06/03/24 0500 06/03/24 1621 06/04/24 0517  NA 133*   < > 133*   < > 135 135 135 135 135  K 3.9   < > 3.9   < > 3.9 4.0 4.0 4.0 4.1  CL 98   < > 98   < > 100 100 102 100 102  CO2 22   < > 25   < > 24 24 25 23 25   GLUCOSE 128*   < > 134*   < > 129* 138* 151* 133* 122*  BUN 60*   < > 44*   < > 32* 29* 30* 31* 29*  CREATININE 5.37*   < > 3.49*   < > 2.37* 1.97* 1.80* 1.80* 1.60*  CALCIUM  8.0*   < > 8.0*   < > 8.2* 8.2* 8.2* 8.0* 8.2*  MG 2.1  --  2.3  --  2.5*  --  2.5*  --  2.7*  PHOS 4.0   < > 3.1   < > 2.8 2.7 3.2 3.9 2.4*   < > = values in this interval not displayed.   GFR: Estimated Creatinine Clearance: 56 mL/min (A) (by C-G formula based on SCr of 1.6 mg/dL (H)). Recent Labs  Lab 05/29/24 1256 05/29/24 1458 05/31/24 1028 05/31/24 1451 06/01/24 0457 06/01/24 1125 06/01/24 1410 06/02/24 0505 06/03/24 0500 06/04/24 0517  PROCALCITON 2.62  --   --   --   --   --   --   --   --   --   WBC 21.1*   < >  --   --  23.0*  --   --  23.0* 28.6* 21.8*  LATICACIDVEN 5.8*   < > 2.7*  2.6*  --  2.0* 2.0*  --   --   --    < > = values in this interval not displayed.    Liver Function Tests: Recent Labs  Lab 05/29/24 1256 05/30/24 0338 05/31/24 0459  05/31/24 1558 06/02/24 0505 06/02/24 1630 06/03/24 0500 06/03/24 1621 06/04/24 0517  AST 301* 270* 250*  --   --   --   --   --   --   ALT 90* 91* 94*  --   --   --   --   --   --   ALKPHOS 226* 205* 206*  --   --   --   --   --   --   BILITOT 3.1* 3.6* 5.4*  --   --   --   --   --   --   PROT 4.9* 4.4* 4.6*  --   --   --   --   --   --   ALBUMIN  2.3* 2.0* 2.1*   < > 2.6* 2.6* 2.9* 2.8* 3.1*   < > = values in this interval not displayed.   No results for input(s): LIPASE, AMYLASE in the last 168 hours. No results for input(s): AMMONIA in the last 168 hours.  ABG    Component Value Date/Time   PHART 7.298 (L) 11/27/2021 0345   PCO2ART 39.7 11/27/2021 0345   PO2ART 116 (H) 11/27/2021 0345   HCO3 21.9 05/29/2024 2040   TCO2 21 (L) 08/08/2023 0916   ACIDBASEDEF 2.8 (H) 05/29/2024 2040   O2SAT 73.5 06/01/2024 1125     Coagulation Profile: Recent Labs  Lab 05/31/24 0459 06/01/24 0457 06/02/24 0505 06/03/24 0500 06/04/24 0517  INR 4.9* 3.5* 2.4* 2.0* 1.7*    Cardiac Enzymes: No results for input(s): CKTOTAL, CKMB, CKMBINDEX, TROPONINI in the last 168 hours.  HbA1C: Hgb A1c MFr Bld  Date/Time Value Ref Range Status  11/27/2021 03:28 AM 5.6 4.8 - 5.6 % Final    Comment:    (NOTE) Pre diabetes:          5.7%-6.4%  Diabetes:              >6.4%  Glycemic control for   <7.0% adults with diabetes     CBG: Recent Labs  Lab 05/29/24 1540  GLUCAP 113*      DVT/GI PRX  assessed I Assessed the need for Labs I Assessed the need for Foley I Assessed the need for Central Venous Line Family Discussion when available I Assessed the need for Mobilization I made an Assessment of medications to be adjusted accordingly Safety Risk assessment completed   Critical care provider statement:   Total critical care time: 33 minutes   Performed by: Parris MD   Critical care time was exclusive of separately billable procedures and treating other  patients.   Critical care was necessary to treat or prevent imminent or life-threatening deterioration.   Critical care was time spent personally by me on the following activities: development of treatment plan with patient and/or surrogate as well as nursing, discussions with consultants, evaluation of patient's response to treatment, examination of patient, obtaining history from patient or surrogate, ordering and performing treatments and interventions, ordering and review of laboratory studies, ordering and review of radiographic studies, pulse oximetry and re-evaluation of patient's condition.    Cowen Pesqueira, M.D.  Pulmonary & Critical Care Medicine

## 2024-06-04 NOTE — Progress Notes (Signed)
 [facility]  ROUNDING NOTE   Subjective:   AULTON ROUTT  is a 68 y.o.  male  with past medical conditions including hypertension, COPD, obstructive sleep apnea with CPAP, anemia, and recent acute kidney injury requiring hemodialysis. Patient presents to ED with decreased blood pressure and has been admitted to ICU for Septic shock (HCC) [A41.9, R65.21]  Patient seen and evaluated at bedside. Alert and oriented.   Update: Patient sitting up in bed in ICU Alert and oriented 2L [place] Pressors: Levo and Vaso, weaning as tolerated  CRRT with net UF -128ml/hr; net neg by 4300 cc last 24 hrs   Objective:  Vital signs in last 24 hours:  Temp:  [98 F (36.7 C)-98.5 F (36.9 C)] 98 F (36.7 C) (10/07 1200) Pulse Rate:  [51-68] 57 (10/07 1430) Resp:  [11-19] 17 (10/07 1430) BP: (65-131)/(39-78) 109/56 (10/07 1430) SpO2:  [94 %-100 %] 96 % (10/07 1430) Weight:  [887 kg] 112 kg (10/07 0332)  Weight change:  Filed Weights   06/01/24 0413 06/02/24 0500 06/04/24 0332  Weight: 118.5 kg 115.6 kg 112 kg    Intake/Output: I/O last 3 completed shifts: In: 1978.4 [P.O.:300; I.V.:900.5; Blood:192; Other:41; IV Piggyback:545] Out: 6573    Intake/Output this shift:  Total I/O In: 499.9 [P.O.:120; I.V.:139.7; IV Piggyback:240.2] Out: 1526   Physical Exam: General: NAD  Head: Normocephalic, atraumatic. Moist oral mucosal membranes  Eyes: Anicteric  Lungs:  Clear to auscultation, normal effort  Heart: Regular rate and rhythm  Abdomen:  Soft, nontender  Extremities: 2+ peripheral edema.  Neurologic: Awake, alert, conversant  Skin: Generalized ecchymosis  Access: Right IJ PermCath    Basic Metabolic Panel: Recent Labs  Lab 05/31/24 0459 05/31/24 1558 06/01/24 0457 06/01/24 1545 06/02/24 0505 06/02/24 1630 06/03/24 0500 06/03/24 1621 06/04/24 0517  NA 133*   < > 133*   < > 135 135 135 135 135  K 3.9   < > 3.9   < > 3.9 4.0 4.0 4.0 4.1  CL 98   < > 98   < > 100  100 102 100 102  CO2 22   < > 25   < > 24 24 25 23 25   GLUCOSE 128*   < > 134*   < > 129* 138* 151* 133* 122*  BUN 60*   < > 44*   < > 32* 29* 30* 31* 29*  CREATININE 5.37*   < > 3.49*   < > 2.37* 1.97* 1.80* 1.80* 1.60*  CALCIUM  8.0*   < > 8.0*   < > 8.2* 8.2* 8.2* 8.0* 8.2*  MG 2.1  --  2.3  --  2.5*  --  2.5*  --  2.7*  PHOS 4.0   < > 3.1   < > 2.8 2.7 3.2 3.9 2.4*   < > = values in this interval not displayed.    Liver Function Tests: Recent Labs  Lab 05/29/24 1256 05/30/24 0338 05/31/24 0459 05/31/24 1558 06/02/24 0505 06/02/24 1630 06/03/24 0500 06/03/24 1621 06/04/24 0517  AST 301* 270* 250*  --   --   --   --   --   --   ALT 90* 91* 94*  --   --   --   --   --   --   ALKPHOS 226* 205* 206*  --   --   --   --   --   --   BILITOT 3.1* 3.6* 5.4*  --   --   --   --   --   --  PROT 4.9* 4.4* 4.6*  --   --   --   --   --   --   ALBUMIN  2.3* 2.0* 2.1*   < > 2.6* 2.6* 2.9* 2.8* 3.1*   < > = values in this interval not displayed.   No results for input(s): LIPASE, AMYLASE in the last 168 hours. No results for input(s): AMMONIA in the last 168 hours.   CBC: Recent Labs  Lab 05/29/24 1256 05/29/24 2040 05/30/24 0338 05/31/24 9361 05/31/24 1451 06/01/24 0457 06/01/24 1125 06/02/24 0505 06/02/24 1227 06/03/24 0500 06/03/24 0503 06/03/24 1135 06/03/24 1737 06/03/24 2339 06/04/24 0517 06/04/24 1203  WBC 21.1* 32.3*   < > 27.4*  --  23.0*  --  23.0*  --  28.6*  --   --   --   --  21.8*  --   NEUTROABS 18.6* 27.5*  --   --   --   --   --   --   --   --   --   --   --   --   --   --   HGB 7.9* 7.7*   < > 6.9*   < > 7.5*   < > 7.3*  7.4*   < > 8.1*   < > 7.8* 7.6* 7.6* 7.4* 7.4*  HCT 24.9* 24.4*   < > 21.0*   < > 22.5*   < > 22.4*  22.5*   < > 24.9*   < > 24.3* 23.8* 24.1* 23.6* 23.4*  MCV 106.9* 107.0*   < > 99.1  --  96.6  --  99.6  --  101.2*  --   --   --   --  103.1*  --   PLT 117* 180   < > 116*  --  107*  --  98*  --  106*  --   --   --   --  81*  --     < > = values in this interval not displayed.    Cardiac Enzymes: No results for input(s): CKTOTAL, CKMB, CKMBINDEX, TROPONINI in the last 168 hours.  BNP: Invalid input(s): POCBNP  CBG: Recent Labs  Lab 05/29/24 1540  GLUCAP 113*     Microbiology: Results for orders placed or performed during the hospital encounter of 05/29/24  Resp panel by RT-PCR (RSV, Flu A&B, Covid) Anterior Nasal Swab     Status: None   Collection Time: 05/29/24 12:56 PM   Specimen: Anterior Nasal Swab  Result Value Ref Range Status   SARS Coronavirus 2 by RT PCR NEGATIVE NEGATIVE Final    Comment: (NOTE) SARS-CoV-2 target nucleic acids are NOT DETECTED.  The SARS-CoV-2 RNA is generally detectable in upper respiratory specimens during the acute phase of infection. The lowest concentration of SARS-CoV-2 viral copies this assay can detect is 138 copies/mL. A negative result does not preclude SARS-Cov-2 infection and should not be used as the sole basis for treatment or other patient management decisions. A negative result may occur with  improper specimen collection/handling, submission of specimen other than nasopharyngeal swab, presence of viral mutation(s) within the areas targeted by this assay, and inadequate number of viral copies(<138 copies/mL). A negative result must be combined with clinical observations, patient history, and epidemiological information. The expected result is Negative.  Fact Sheet for Patients:  BloggerCourse.com  Fact Sheet for Healthcare Providers:  SeriousBroker.it  This test is no t yet approved or cleared by the [place]  FDA and  has been authorized for detection and/or diagnosis of SARS-CoV-2 by FDA under an Emergency Use Authorization (EUA). This EUA will remain  in effect (meaning this test can be used) for the duration of the COVID-19 declaration under Section 564(b)(1) of the Act,  21 U.S.C.section 360bbb-3(b)(1), unless the authorization is terminated  or revoked sooner.       Influenza A by PCR NEGATIVE NEGATIVE Final   Influenza B by PCR NEGATIVE NEGATIVE Final    Comment: (NOTE) The Xpert Xpress SARS-CoV-2/FLU/RSV plus assay is intended as an aid in the diagnosis of influenza from Nasopharyngeal swab specimens and should not be used as a sole basis for treatment. Nasal washings and aspirates are unacceptable for Xpert Xpress SARS-CoV-2/FLU/RSV testing.  Fact Sheet for Patients: BloggerCourse.com  Fact Sheet for Healthcare Providers: SeriousBroker.it  This test is not yet approved or cleared by the [place]  FDA and has been authorized for detection and/or diagnosis of SARS-CoV-2 by FDA under an Emergency Use Authorization (EUA). This EUA will remain in effect (meaning this test can be used) for the duration of the COVID-19 declaration under Section 564(b)(1) of the Act, 21 U.S.C. section 360bbb-3(b)(1), unless the authorization is terminated or revoked.     Resp Syncytial Virus by PCR NEGATIVE NEGATIVE Final    Comment: (NOTE) Fact Sheet for Patients: BloggerCourse.com  Fact Sheet for Healthcare Providers: SeriousBroker.it  This test is not yet approved or cleared by the [place]  FDA and has been authorized for detection and/or diagnosis of SARS-CoV-2 by FDA under an Emergency Use Authorization (EUA). This EUA will remain in effect (meaning this test can be used) for the duration of the COVID-19 declaration under Section 564(b)(1) of the Act, 21 U.S.C. section 360bbb-3(b)(1), unless the authorization is terminated or revoked.  Performed at [facility], [address] [place], [place] [address]   Blood culture (routine x 2)     Status: None   Collection Time: 05/29/24 12:56 PM   Specimen: BLOOD  Result Value Ref Range  Status   Specimen Description BLOOD RIGHT ANTECUBITAL  Final   Special Requests   Final    BOTTLES DRAWN AEROBIC AND ANAEROBIC Blood Culture results may not be optimal due to an inadequate volume of blood received in culture bottles   Culture   Final    NO GROWTH 5 DAYS Performed at [facility], [address] [place], [place] [address]    Report Status 06/03/2024 FINAL  Final  Blood culture (routine x 2)     Status: None   Collection Time: 05/29/24 12:56 PM   Specimen: BLOOD  Result Value Ref Range Status   Specimen Description BLOOD LEFT ANTECUBITAL  Final   Special Requests   Final    BOTTLES DRAWN AEROBIC AND ANAEROBIC Blood Culture results may not be optimal due to an inadequate volume of blood received in culture bottles   Culture   Final    NO GROWTH 5 DAYS Performed at [facility], [address], [place], [place] [address]    Report Status 06/03/2024 FINAL  Final  MRSA Next Gen by PCR, Nasal     Status: None   Collection Time: 05/29/24  2:58 PM   Specimen: Nasal Mucosa; Nasal Swab  Result Value Ref Range Status   MRSA by PCR Next Gen NOT DETECTED NOT DETECTED Final    Comment: (NOTE) The GeneXpert MRSA Assay (FDA approved for NASAL specimens only), is one component of a comprehensive MRSA colonization surveillance program. It  is not intended to diagnose MRSA infection nor to guide or monitor treatment for MRSA infections. Test performance is not FDA approved in patients less than 8 years old. Performed at [facility], [address] [place], [place] [address]   Group A Strep by PCR     Status: None   Collection Time: 05/29/24  2:58 PM   Specimen: Throat; Sterile Swab  Result Value Ref Range Status   Group A Strep by PCR NOT DETECTED NOT DETECTED Final    Comment: Performed at [facility], [address] [place], [place] [address]  Respiratory (~20 pathogens) panel by PCR     Status: None   Collection Time: 05/29/24   3:00 PM  Result Value Ref Range Status   Adenovirus NOT DETECTED NOT DETECTED Final   Coronavirus 229E NOT DETECTED NOT DETECTED Final    Comment: (NOTE) The Coronavirus on the Respiratory Panel, DOES NOT test for the novel  Coronavirus (2019 nCoV)    Coronavirus HKU1 NOT DETECTED NOT DETECTED Final   Coronavirus NL63 NOT DETECTED NOT DETECTED Final   Coronavirus OC43 NOT DETECTED NOT DETECTED Final   Metapneumovirus NOT DETECTED NOT DETECTED Final   Rhinovirus / Enterovirus NOT DETECTED NOT DETECTED Final   Influenza A NOT DETECTED NOT DETECTED Final   Influenza B NOT DETECTED NOT DETECTED Final   Parainfluenza Virus 1 NOT DETECTED NOT DETECTED Final   Parainfluenza Virus 2 NOT DETECTED NOT DETECTED Final   Parainfluenza Virus 3 NOT DETECTED NOT DETECTED Final   Parainfluenza Virus 4 NOT DETECTED NOT DETECTED Final   Respiratory Syncytial Virus NOT DETECTED NOT DETECTED Final   Bordetella pertussis NOT DETECTED NOT DETECTED Final   Bordetella Parapertussis NOT DETECTED NOT DETECTED Final   Chlamydophila pneumoniae NOT DETECTED NOT DETECTED Final   Mycoplasma pneumoniae NOT DETECTED NOT DETECTED Final    Comment: Performed at [facility], [address] [place], [place] [address]  MRSA Next Gen by PCR, Nasal     Status: None   Collection Time: 05/29/24  3:50 PM   Specimen: Nasal Mucosa; Nasal Swab  Result Value Ref Range Status   MRSA by PCR Next Gen NOT DETECTED NOT DETECTED Final    Comment: (NOTE) The GeneXpert MRSA Assay (FDA approved for NASAL specimens only), is one component of a comprehensive MRSA colonization surveillance program. It is not intended to diagnose MRSA infection nor to guide or monitor treatment for MRSA infections. Test performance is not FDA approved in patients less than 36 years old. Performed at [facility], [address] [place], [place] [address]     Coagulation Studies: Recent Labs    06/02/24 0505 06/03/24 0500  06/04/24 0517  LABPROT 27.6* 23.9* 21.0*  INR 2.4* 2.0* 1.7*     Urinalysis: No results for input(s): COLORURINE, LABSPEC, PHURINE, GLUCOSEU, HGBUR, BILIRUBINUR, KETONESUR, PROTEINUR, UROBILINOGEN, NITRITE, LEUKOCYTESUR in the last 72 hours.  Invalid input(s): APPERANCEUR     Imaging: No results found.      Medications:    sodium chloride      norepinephrine  (LEVOPHED ) Adult infusion 9 mcg/min (06/04/24 1400)   piperacillin -tazobactam Stopped (06/04/24 1155)   prismasol BGK 4/2.5 100 mL/hr at 06/04/24 1000   prismasol BGK 4/2.5 100 mL/hr at 06/04/24 1000   prismasol BGK 4/2.5 1,000 mL/hr at 06/04/24 1155   sodium PHOSPHATE  IVPB (in mmol) 43 mL/hr at 06/04/24 1400   vasopressin  0.04 Units/min (06/04/24 1400)     vitamin C   500 mg  Oral BID   Chlorhexidine  Gluconate Cloth  6 each Topical Daily   epoetin  alfa-epbx (RETACRIT ) injection  10,000 Units Subcutaneous Q T,Th,Sat-1800   feeding supplement (NEPRO CARB STEADY)  237 mL Oral TID BM   hydrocortisone sod succinate (SOLU-CORTEF) inj  50 mg Intravenous Q6H   midodrine   10 mg Oral TID WC   multivitamin  1 tablet Oral QHS   pantoprazole  (PROTONIX ) IV  40 mg Intravenous Q12H   thiamine   100 mg Oral Daily   alteplase , alteplase , docusate sodium , heparin , heparin , heparin , mouth rinse, polyethylene glycol, zolpidem   Assessment/ Plan:  Mr. QUAME SPRATLIN is a 68 y.o.  male  with past medical conditions including hypertension, COPD, obstructive sleep apnea with CPAP, anemia, and chronic kidney disease stage IV, who was admitted to [facility] on 05/29/2024 for Septic shock (HCC) [A41.9, R65.21]  [facility]/TTS/RT permcath  Acute kidney injury on chronic kidney disease stage IV with Volume overload Baseline creatinine 2.77 with GFR 24 06/30/2024. CT abd/pelvis shows dense lesion on left kidney. Tolerating CRRT with net neg of 4.3 L yesterday. Will continue net neg of 125 ml/hr for the next 24 hours.   Continue as tolerated.  Reduce the dialysate flow rate due to hypophosphatemia.   Still has significant amount of pitting edema.  We will continue CRRT for another day.  Lab Results  Component Value Date   CREATININE 1.60 (H) 06/04/2024   CREATININE 1.80 (H) 06/03/2024   CREATININE 1.80 (H) 06/03/2024    Intake/Output Summary (Last 24 hours) at 06/04/2024 1443 Last data filed at 06/04/2024 1400 Gross per 24 hour  Intake 1456.51 ml  Output 4676 ml  Net -3219.49 ml   2. Hypotension likely due to shock secondary to liver cirrhosis.  Patient remains on norepinephrine  and vasopressin , managed by primary team. Wean as tolerated.  3. Anemia of chronic kidney disease Lab Results  Component Value Date   HGB 7.4 (L) 06/04/2024  Patient has received multiple blood products during this admission.  Continue Retacrit  10000 units  subcu injections T/T/S.  4.  Secondary Hyperparathyroidism: with outpatient labs: PTH 52.7, phosphorus 4.1, calcium  8.6 on 7/2.  Lab Results  Component Value Date   CALCIUM  8.2 (L) 06/04/2024   CAION 1.21 08/08/2023   PHOS 2.4 (L) 06/04/2024   Monitoring bone minerals during this admission.    LOS: 6 Demiah Gullickson 10/7/20252:43 PM

## 2024-06-04 NOTE — Plan of Care (Signed)

## 2024-06-04 NOTE — Progress Notes (Addendum)
 Patient is scheduled for an EGD tomorrow; 1 unit FFP ordered and to be given at 06/04/24 at 0000 pre-procedure.

## 2024-06-04 NOTE — Plan of Care (Addendum)
   A&O patient continues to be on CRRT (Net Hourly Fluid Balance Goal: -125 ml/hr) with no difficulties noted this shift; Levophed  titrated per order MAP goal 65 currently infusing @ 10 mcg/min. And Vasopressin  @ 0.04 units/min. Patient is oliguric and has a rectal pouch in place with black/green tarry stool connect to gravity drain bag. Patient denies pain this shift. O2 is at 2L. PRN Ambien  given last night, patient reports that he rested but was awake anytime staff present. 1 unit FFP given for possible EGD this morning. Patient also NPO since midnight for possible EGD.  Call light placed within reach and patient instructed to call when assistance is needed. Pt verbalized understanding.      Problem: Education: Goal: Knowledge of General Education information will improve Description: Including pain rating scale, medication(s)/side effects and non-pharmacologic comfort measures Outcome: Progressing   Problem: Health Behavior/Discharge Planning: Goal: Ability to manage health-related needs will improve Outcome: Progressing   Problem: Clinical Measurements: Goal: Ability to maintain clinical measurements within normal limits will improve Outcome: Progressing Goal: Diagnostic test results will improve Outcome: Progressing Goal: Respiratory complications will improve Outcome: Progressing Goal: Cardiovascular complication will be avoided Outcome: Progressing   Problem: Coping: Goal: Level of anxiety will decrease Outcome: Progressing   Problem: Elimination: Goal: Will not experience complications related to bowel motility Outcome: Progressing Goal: Will not experience complications related to urinary retention Outcome: Progressing   Problem: Pain Managment: Goal: General experience of comfort will improve and/or be controlled Outcome: Progressing   Problem: Safety: Goal: Ability to remain free from injury will improve Outcome: Progressing   Problem: Skin Integrity: Goal: Risk  for impaired skin integrity will decrease Outcome: Progressing   Problem: Clinical Measurements: Goal: Will remain free from infection Outcome: Not Progressing   Problem: Activity: Goal: Risk for activity intolerance will decrease Outcome: Not Progressing   Problem: Nutrition: Goal: Adequate nutrition will be maintained Outcome: Not Progressing

## 2024-06-04 NOTE — Progress Notes (Signed)
 1 unit FFP completed per order without complications.

## 2024-06-05 ENCOUNTER — Other Ambulatory Visit: Payer: Self-pay

## 2024-06-05 ENCOUNTER — Inpatient Hospital Stay: Admitting: Anesthesiology

## 2024-06-05 ENCOUNTER — Encounter: Admission: EM | Disposition: E | Payer: Self-pay | Source: Home / Self Care | Attending: Pulmonary Disease

## 2024-06-05 ENCOUNTER — Ambulatory Visit: Admission: RE | Admit: 2024-06-05 | Source: Home / Self Care | Admitting: Gastroenterology

## 2024-06-05 ENCOUNTER — Encounter: Payer: Self-pay | Admitting: Internal Medicine

## 2024-06-05 DIAGNOSIS — K7031 Alcoholic cirrhosis of liver with ascites: Secondary | ICD-10-CM

## 2024-06-05 DIAGNOSIS — F1011 Alcohol abuse, in remission: Secondary | ICD-10-CM

## 2024-06-05 DIAGNOSIS — I81 Portal vein thrombosis: Secondary | ICD-10-CM | POA: Diagnosis not present

## 2024-06-05 DIAGNOSIS — R570 Cardiogenic shock: Secondary | ICD-10-CM | POA: Diagnosis not present

## 2024-06-05 DIAGNOSIS — A599 Trichomoniasis, unspecified: Secondary | ICD-10-CM

## 2024-06-05 DIAGNOSIS — B3781 Candidal esophagitis: Secondary | ICD-10-CM

## 2024-06-05 HISTORY — PX: ESOPHAGOGASTRODUODENOSCOPY: SHX5428

## 2024-06-05 LAB — GASTROINTESTINAL PANEL BY PCR, STOOL (REPLACES STOOL CULTURE)

## 2024-06-05 LAB — RENAL FUNCTION PANEL
Albumin: 2.9 g/dL — ABNORMAL LOW (ref 3.5–5.0)
Albumin: 2.7 g/dL — ABNORMAL LOW (ref 3.5–5.0)
Anion gap: 11 (ref 5–15)
Anion gap: 11 (ref 5–15)
BUN: 37 mg/dL — ABNORMAL HIGH (ref 8–23)
BUN: 41 mg/dL — ABNORMAL HIGH (ref 8–23)
CO2: 24 mmol/L (ref 22–32)
CO2: 23 mmol/L (ref 22–32)
Calcium: 8.3 mg/dL — ABNORMAL LOW (ref 8.9–10.3)
Calcium: 8 mg/dL — ABNORMAL LOW (ref 8.9–10.3)
Chloride: 100 mmol/L (ref 98–111)
Chloride: 101 mmol/L (ref 98–111)
Creatinine, Ser: 1.93 mg/dL — ABNORMAL HIGH (ref 0.61–1.24)
Creatinine, Ser: 2.1 mg/dL — ABNORMAL HIGH (ref 0.61–1.24)
GFR, Estimated: 38 mL/min — ABNORMAL LOW (ref >60)
GFR, Estimated: 34 mL/min — ABNORMAL LOW (ref >60)
Glucose, Bld: 130 mg/dL — ABNORMAL HIGH (ref 70–99)
Glucose, Bld: 147 mg/dL — ABNORMAL HIGH (ref 70–99)
Phosphorus: 3.3 mg/dL (ref 2.5–4.6)
Phosphorus: 4 mg/dL (ref 2.5–4.6)
Potassium: 4 mmol/L (ref 3.5–5.1)
Potassium: 4.1 mmol/L (ref 3.5–5.1)
Sodium: 135 mmol/L (ref 135–145)
Sodium: 135 mmol/L (ref 135–145)

## 2024-06-05 LAB — BPAM FFP
Blood Product Expiration Date: 202510122359
ISSUE DATE / TIME: 202510072347
Unit Type and Rh: 6200

## 2024-06-05 LAB — KOH PREP

## 2024-06-05 LAB — PROTIME-INR
INR: 1.7 — ABNORMAL HIGH (ref 0.8–1.2)
Prothrombin Time: 20.5 s — ABNORMAL HIGH (ref 11.4–15.2)

## 2024-06-05 LAB — CBC
HCT: 25 % — ABNORMAL LOW (ref 39.0–52.0)
Hemoglobin: 7.9 g/dL — ABNORMAL LOW (ref 13.0–17.0)
MCH: 32.4 pg (ref 26.0–34.0)
MCHC: 31.6 g/dL (ref 30.0–36.0)
MCV: 102.5 fL — ABNORMAL HIGH (ref 80.0–100.0)
Platelets: 89 K/uL — ABNORMAL LOW (ref 150–400)
RBC: 2.44 MIL/uL — ABNORMAL LOW (ref 4.22–5.81)
RDW: 28.5 % — ABNORMAL HIGH (ref 11.5–15.5)
WBC: 25.1 K/uL — ABNORMAL HIGH (ref 4.0–10.5)
nRBC: 0.1 % (ref 0.0–0.2)

## 2024-06-05 LAB — PREPARE FRESH FROZEN PLASMA: Unit division: 0

## 2024-06-05 LAB — HEMOGLOBIN AND HEMATOCRIT, BLOOD
HCT: 25.4 % — ABNORMAL LOW (ref 39.0–52.0)
HCT: 23.8 % — ABNORMAL LOW (ref 39.0–52.0)
Hemoglobin: 7.9 g/dL — ABNORMAL LOW (ref 13.0–17.0)
Hemoglobin: 7.5 g/dL — ABNORMAL LOW (ref 13.0–17.0)

## 2024-06-05 LAB — MAGNESIUM: Magnesium: 2.6 mg/dL — ABNORMAL HIGH (ref 1.7–2.4)

## 2024-06-05 SURGERY — EGD (ESOPHAGOGASTRODUODENOSCOPY)
Anesthesia: General

## 2024-06-05 MED ORDER — FLUCONAZOLE 100 MG PO TABS
400.0000 mg | ORAL_TABLET | Freq: Every day | ORAL | Status: DC
  Administered 2024-06-05: 400 mg via ORAL
  Filled 2024-06-05: qty 4

## 2024-06-05 MED ORDER — FLUCONAZOLE IN SODIUM CHLORIDE 400-0.9 MG/200ML-% IV SOLN
400.0000 mg | INTRAVENOUS | Status: DC
  Filled 2024-06-05: qty 200

## 2024-06-05 MED ORDER — SODIUM CHLORIDE 0.9 % IV SOLN: INTRAVENOUS | Status: DC

## 2024-06-05 MED ORDER — FLUCONAZOLE IN SODIUM CHLORIDE 200-0.9 MG/100ML-% IV SOLN: 200.0000 mg | INTRAVENOUS | Status: DC

## 2024-06-05 MED ORDER — PRISMASOL BGK 2/3.5 32-2-3.5 MEQ/L EC SOLN: Status: DC

## 2024-06-05 MED ORDER — PROPOFOL 10 MG/ML IV BOLUS
INTRAVENOUS | Status: DC | PRN
  Administered 2024-06-05: 30 mg via INTRAVENOUS
  Administered 2024-06-05: 50 mg via INTRAVENOUS

## 2024-06-05 MED ORDER — MAGIC MOUTHWASH
5.0000 mL | Freq: Four times a day (QID) | ORAL | Status: DC
Start: 2024-06-05 — End: 2024-06-06
  Administered 2024-06-05 – 2024-06-06 (×5): 5 mL via ORAL
  Filled 2024-06-05 (×7): qty 5

## 2024-06-05 MED ORDER — FLUCONAZOLE IN SODIUM CHLORIDE 400-0.9 MG/200ML-% IV SOLN
400.0000 mg | Freq: Once | INTRAVENOUS | Status: DC
  Filled 2024-06-05: qty 200

## 2024-06-05 MED ORDER — ALBUMIN HUMAN 25 % IV SOLN
25.0000 g | Freq: Once | INTRAVENOUS | Status: AC
  Administered 2024-06-05: 25 g via INTRAVENOUS
  Filled 2024-06-05: qty 100

## 2024-06-05 MED ORDER — LIDOCAINE HCL (CARDIAC) PF 100 MG/5ML IV SOSY
PREFILLED_SYRINGE | INTRAVENOUS | Status: DC | PRN
  Administered 2024-06-05: 100 mg via INTRAVENOUS

## 2024-06-05 MED ORDER — PROPOFOL 1000 MG/100ML IV EMUL
INTRAVENOUS | Status: AC
  Filled 2024-06-05: qty 100

## 2024-06-05 MED ORDER — PRISMASOL BGK 4/2.5 32-4-2.5 MEQ/L EC SOLN
Status: DC
Start: 2024-06-05 — End: 2024-06-06

## 2024-06-05 MED ORDER — FLUCONAZOLE IN SODIUM CHLORIDE 400-0.9 MG/200ML-% IV SOLN
400.0000 mg | Freq: Once | INTRAVENOUS | Status: AC
  Administered 2024-06-05: 400 mg via INTRAVENOUS
  Filled 2024-06-05: qty 200

## 2024-06-05 MED ORDER — LIDOCAINE HCL (PF) 2 % IJ SOLN
INTRAMUSCULAR | Status: AC
  Filled 2024-06-05: qty 5

## 2024-06-05 MED ORDER — MIDODRINE HCL 5 MG PO TABS
15.0000 mg | ORAL_TABLET | Freq: Three times a day (TID) | ORAL | Status: DC
  Administered 2024-06-05 – 2024-06-06 (×3): 15 mg via ORAL
  Filled 2024-06-05 (×3): qty 3

## 2024-06-05 MED ORDER — EPHEDRINE SULFATE-NACL 50-0.9 MG/10ML-% IV SOSY
PREFILLED_SYRINGE | INTRAVENOUS | Status: DC | PRN
  Administered 2024-06-05: 10 mg via INTRAVENOUS

## 2024-06-05 MED ORDER — NYSTATIN 100000 UNIT/ML MT SUSP
5.0000 mL | Freq: Four times a day (QID) | OROMUCOSAL | Status: DC
Start: 2024-06-05 — End: 2024-06-06
  Administered 2024-06-05 – 2024-06-06 (×6): 500000 [IU] via ORAL
  Filled 2024-06-05 (×7): qty 5

## 2024-06-05 MED ORDER — METRONIDAZOLE 500 MG/100ML IV SOLN
500.0000 mg | Freq: Two times a day (BID) | INTRAVENOUS | Status: DC
  Administered 2024-06-05 – 2024-06-11 (×12): 500 mg via INTRAVENOUS
  Filled 2024-06-05 (×12): qty 100

## 2024-06-05 NOTE — Op Note (Addendum)
 [facility] Patient Name: Kevin Gross Procedure Date: 06/05/2024 9:41 AM MRN: [id number] Account #: [id number] Date of Birth: [birth year] Admit Type: Inpatient Age: 68 Room: [facility] ENDO ROOM 3 Gender: Male Note Status: Supervisor Override Instrument Name: Barnie GI Scope [phone] Procedure:             Upper GI endoscopy Indications:           Melena Providers:             Ruel Kung MD, MD Referring MD:          No Local Md, MD (Referring MD) Medicines:             Monitored Anesthesia Care Complications:         No immediate complications. Procedure:             Pre-Anesthesia Assessment:                        - Prior to the procedure, a History and Physical was                         performed, and patient medications, allergies and                         sensitivities were reviewed. The patient's tolerance                         of previous anesthesia was reviewed.                        - The risks and benefits of the procedure and the                         sedation options and risks were discussed with the                         patient. All questions were answered and informed                         consent was obtained.                        - ASA Grade Assessment: III - A patient with severe                         systemic disease.                        After obtaining informed consent, the endoscope was                         passed under direct vision. Throughout the procedure,                         the patient's blood pressure, pulse, and oxygen                         saturations were monitored continuously. The Endoscope  was introduced through the mouth, and advanced to the                         third part of duodenum. The upper GI endoscopy was                         accomplished with ease. The patient tolerated the                         procedure well. Findings:      The examined duodenum  was normal.      The stomach was normal.      The cardia and gastric fundus were normal on retroflexion.      Diffuse, white plaques were found in the upper third of the esophagus       and in the middle third of the esophagus. Brushings for KOH prep were       obtained in the middle third of the esophagus.      The exam was otherwise without abnormality. Impression:            - Normal examined duodenum.                        - Normal stomach.                        - Esophageal plaques were found, suspicious for                         candidiasis. Brushings performed.                        - The examination was otherwise normal. Recommendation:        - Discharge patient to home.                        - Return patient to hospital ward for ongoing care.                        - Resume previous diet.                        - Continue present medications.                        - Suspect bleeding from supratherapeutic inr- watch if                         continues to have further melena with drop in hb then                         consider capsule study of the small bowel Procedure Code(s):     --- Professional ---                        [id number], Esophagogastroduodenoscopy, flexible,                         transoral; diagnostic, including collection of  specimen(s) by brushing or washing, when performed                         (separate procedure) Diagnosis Code(s):     --- Professional ---                        K22.9, Disease of esophagus, unspecified                        K92.1, Melena (includes Hematochezia) CPT copyright 2022 American Medical Association. All rights reserved. The codes documented in this report are preliminary and upon coder review may  be revised to meet current compliance requirements. Ruel Kung, MD Ruel Kung MD, MD 06/05/2024 10:03:41 AM This report has been signed electronically. Number of Addenda: 0 Note Initiated On: 06/05/2024  9:41 AM Estimated Blood Loss:  Estimated blood loss: none.      [facility]

## 2024-06-05 NOTE — Progress Notes (Signed)
 Nutrition Follow Up Note   DOCUMENTATION CODES:   Obesity unspecified  INTERVENTION:   Nepro Shake po TID, each supplement provides 425 kcal and 19 grams protein  Magic cup TID with meals, each supplement provides 290 kcal and 9 grams of protein  Rena-vite po daily   Vitamin C  500mg  po BID   Thiamine  100mg  po daily x 7 days  Pt remains at high refeed risk; recommend monitor potassium, magnesium  and phosphorus labs daily until stable  Daily weights   NUTRITION DIAGNOSIS:   Increased nutrient needs related to chronic illness (ESRD on HD) as evidenced by estimated needs. -ongoing   GOAL:   Patient will meet greater than or equal to 90% of their needs -progressing   MONITOR:   PO intake, Supplement acceptance, Labs, Weight trends, I & O's, Skin  ASSESSMENT:   68 y/o male with h/o COPD, PAD, CAD, etoh abuse, cirrhosis, OSA, diverticulosis, hemorrhoids, IDA, HLD, CHF, PAF, PUD (with duodenal ulcer and angiodysplasia), prostate cancer, gout and recent admission for CHF, shock, bradycardia and AKI with progression to ESRD s/p new HD 9/18 and who is now admitted with weakness and congestion and was found to have CHF, sepsis, shock and melena.  -Pt s/p EGD today; found to have esophageal plaques suspicious for candidiasis.   Pt in EGD at the time of RD visit this morning. Pt with improving appetite and oral intake. Pt eating anywhere from sips/bites to 100% of meals and is drinking most of the Nepro supplements. Pt NPO today for EGD. Recommend continue supplements and vitamins. Pt remains at refeed risk. Per chart, pt is down ~14lbs since admission. Pt -6.3L on his I & Os.     Medications reviewed and include: vitamin C , epoetin , solu-cortef, midodrine , rena-vite, protonix , thiamine , zosyn , vasopressin     Labs reviewed: K 4.0 wnl, BUN 37(H), creat 1.93(H), P 3.3 wnl, Mg 2.6(H) Wbc- 25.1(H), Hgb 7.9(L), Hct 25.0(L) Iron  60, TIBC 245(L), ferritin 209- 8/18  Diet Order:   Diet  Order             Diet clear liquid Room service appropriate? Yes; Fluid consistency: Thin  Diet effective now                  EDUCATION NEEDS:   Education needs have been addressed  Skin:  Skin Assessment: Reviewed RN Assessment (DTI sacrum and coccyx, partial thickness skin loss L arm, L leg wound)  Last BM:  10/8- type 6  Height:   Ht Readings from Last 1 Encounters:  05/29/24 5' 9 (1.753 m)    Weight:   Wt Readings from Last 1 Encounters:  06/05/24 106.6 kg    Ideal Body Weight:  72.7 kg  BMI:  Body mass index is 34.71 kg/m.  Estimated Nutritional Needs:   Kcal:  2300-2600kcal/day  Protein:  115-130g/day  Fluid:  1.9-2.2L/day  Kevin Shams MS, RD, LDN If unable to be reached, please send secure chat to RD inpatient available from 8:00a-4:00p daily

## 2024-06-05 NOTE — Plan of Care (Addendum)
 A&O patient continues to be on CRRT (Net Hourly Fluid Balance Goal: -125 ml/hr) with no difficulties noted this shift; denies pain this shift. Levophed  titrated per order SBP 90 or higher currently infusing @ 8 mcg/min. And Vasopressin  @ 0.04 units/min. Patient is oliguric and is having frequent brown soft bowel movements, patient is able to assist with turning.   Patient did try to wear c-pap but still c/o ill fitting mask and noises, 2L via [place] applied to keep patients O2 saturation >90%. . PRN Ambien  given last night, ineffective; patient noted to be awake every round. 1 unit FFP given for possible EGD this morning. Patient also NPO since midnight for possible EGD.  Call light placed within reach and patient instructed to call when assistance is needed. Pt verbalized understanding.  Problem: Education: Goal: Knowledge of General Education information will improve Description: Including pain rating scale, medication(s)/side effects and non-pharmacologic comfort measures Outcome: Progressing   Problem: Health Behavior/Discharge Planning: Goal: Ability to manage health-related needs will improve Outcome: Progressing   Problem: Clinical Measurements: Goal: Ability to maintain clinical measurements within normal limits will improve Outcome: Progressing Goal: Diagnostic test results will improve Outcome: Progressing Goal: Respiratory complications will improve Outcome: Progressing Goal: Cardiovascular complication will be avoided Outcome: Progressing   Problem: Coping: Goal: Level of anxiety will decrease Outcome: Progressing   Problem: Elimination: Goal: Will not experience complications related to urinary retention Outcome: Progressing   Problem: Pain Managment: Goal: General experience of comfort will improve and/or be controlled Outcome: Progressing   Problem: Safety: Goal: Ability to remain free from injury will improve Outcome: Progressing   Problem: Skin Integrity: Goal: Risk  for impaired skin integrity will decrease Outcome: Progressing   Problem: Clinical Measurements: Goal: Will remain free from infection Outcome: Not Progressing   Problem: Activity: Goal: Risk for activity intolerance will decrease Outcome: Not Progressing   Problem: Nutrition: Goal: Adequate nutrition will be maintained Outcome: Not Progressing   Problem: Elimination: Goal: Will not experience complications related to bowel motility Outcome: Not Progressing
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
## 2024-06-05 NOTE — Anesthesia Postprocedure Evaluation (Signed)
 Anesthesia Post Note  Patient: Kevin Gross  Procedure(s) Performed: EGD (ESOPHAGOGASTRODUODENOSCOPY)  Patient location during evaluation: PACU Anesthesia Type: General Level of consciousness: awake Pain management: pain level controlled Vital Signs Assessment: post-procedure vital signs reviewed and stable Respiratory status: spontaneous breathing Cardiovascular status: stable Anesthetic complications: no   There were no known notable events for this encounter.   Last Vitals:  Vitals:   06/05/24 1130 06/05/24 1200  BP: (!) 104/55 107/67  Pulse: (!) 57 67  Resp: 16 14  Temp:  36.4 C  SpO2: 100% 100%    Last Pain:  Vitals:   06/05/24 1200  TempSrc: Oral  PainSc: 0-No pain                 VAN STAVEREN,Fordyce Lepak

## 2024-06-05 NOTE — H&P (Signed)
 Ruel Kung , MD [address], [place], [place], [address] Phone: [phone] Fax: [phone]  Primary Care Physician:  Autry Grayce LABOR, PA   Pre-Procedure History & Physical: HPI:  Kevin Gross is a 68 y.o. male is here for an endoscopy    Past Medical History:  Diagnosis Date   Alcoholic cirrhosis of liver (HCC)    Anemia 2024   Cataract 2024   CHF (congestive heart failure) (HCC)    Chronic gouty arthritis    CKD (chronic kidney disease), stage III (HCC)    Clotting disorder    Bilateral legs-pt states plaque build up in both legs requiring intervention   Complication of anesthesia    pt states that years ago during colonoscopy at [facility] they had to abort the procedure due to some cardiac issue had to see cardiologist   COPD (chronic obstructive pulmonary disease) (HCC)    Hyperlipidemia    Hypertension    Hyperuricemia    Peripheral arterial disease    Pre-diabetes    Prostate cancer (HCC) 2023   Sleep apnea    uses cpap    Past Surgical History:  Procedure Laterality Date   ABDOMINAL AORTOGRAM W/LOWER EXTREMITY N/A 10/20/2021   Procedure: ABDOMINAL AORTOGRAM W/LOWER EXTREMITY;  Surgeon: Darron Deatrice LABOR, MD;  Location: [facility];  Service: Cardiovascular;  Laterality: N/A;   ABDOMINAL AORTOGRAM W/LOWER EXTREMITY N/A 08/08/2023   Procedure: ABDOMINAL AORTOGRAM W/LOWER EXTREMITY;  Surgeon: Serene Gaile ORN, MD;  Location: [facility];  Service: Cardiovascular;  Laterality: N/A;   ALLOGRAFT APPLICATION Bilateral 12/22/2021   Procedure: ALLOGRAFT APPLICATION;  Surgeon: Serene Gaile ORN, MD;  Location: [facility] OR;  Service: Vascular;  Laterality: Bilateral;   ANGIOPLASTY Left 11/26/2021   Procedure: LEFT INFRARENAL LITHOTRIPSY;  Surgeon: Serene Gaile ORN, MD;  Location: [facility] OR;  Service: Vascular;  Laterality: Left;   APPLICATION OF WOUND VAC Bilateral 12/13/2021   Procedure: APPLICATION OF WOUND VAC;  Surgeon: Lanis Fonda BRAVO, MD;   Location: [facility] OR;  Service: Vascular;  Laterality: Bilateral;   CARDIAC CATHETERIZATION     [facility]   CATARACT EXTRACTION W/PHACO Left 04/04/2023   Procedure: CATARACT EXTRACTION PHACO AND INTRAOCULAR LENS PLACEMENT (IOC) LEFT 8.71 00:45.5;  Surgeon: Jaye Fallow, MD;  Location: [facility];  Service: Ophthalmology;  Laterality: Left;   CATARACT EXTRACTION W/PHACO Right 04/18/2023   Procedure: CATARACT EXTRACTION PHACO AND INTRAOCULAR LENS PLACEMENT (IOC) RIGHT 4.31 00:8.2;  Surgeon: Jaye Fallow, MD;  Location: [facility];  Service: Ophthalmology;  Laterality: Right;   COLONOSCOPY     COLONOSCOPY WITH PROPOFOL  N/A 01/17/2018   Procedure: COLONOSCOPY WITH PROPOFOL ;  Surgeon: Toledo, Ladell POUR, MD;  Location: [facility] ENDOSCOPY;  Service: Gastroenterology;  Laterality: N/A;   COLONOSCOPY WITH PROPOFOL  N/A 09/12/2022   Procedure: COLONOSCOPY WITH PROPOFOL ;  Surgeon: Maryruth Ole DASEN, MD;  Location: [facility] END[facility]COPY;  Service: Endoscopy;  Laterality: N/A;   COLONOSCOPY WITH PROPOFOL  N/A 09/05/2023   Procedure: COLONOSCOPY WITH PROPOFOL ;  Surgeon: Maryruth Ole DASEN, MD;  Location: [facility] ENDOSCOPY;  Service: Endoscopy;  Laterality: N/A;   DIALYSIS/PERMA CATHETER INSERTION Right 05/21/2024   Procedure: DIALYSIS/PERMA CATHETER INSERTION;  Surgeon: Jama Cordella MATSU, MD;  Location: [facility];  Service: Cardiovascular;  Laterality: Right;   ENDARTERECTOMY FEMORAL Bilateral 11/26/2021   Procedure: BILATERAL FEMORAL ENDARTERECTOMY WITH VEIN PATCH ANGIOPLASTY;  Surgeon: Serene Gaile ORN, MD;  Location: [facility] OR;  Service: Vascular;  Laterality: Bilateral;   ENTEROSCOPY N/A 09/05/2023  Procedure: ENTEROSCOPY;  Surgeon: Maryruth Ole DASEN, MD;  Location: [facility] ENDOSCOPY;  Service: Endoscopy;  Laterality: N/A;   ESOPHAGOGASTRODUODENOSCOPY (EGD) WITH PROPOFOL  N/A 01/17/2018   Procedure: ESOPHAGOGASTRODUODENOSCOPY (EGD) WITH PROPOFOL ;  Surgeon: Toledo, Ladell POUR, MD;  Location: [facility]  ENDOSCOPY;  Service: Gastroenterology;  Laterality: N/A;   ESOPHAGOGASTRODUODENOSCOPY (EGD) WITH PROPOFOL  N/A 09/12/2022   Procedure: ESOPHAGOGASTRODUODENOSCOPY (EGD) WITH PROPOFOL ;  Surgeon: Maryruth Ole DASEN, MD;  Location: [facility] ENDOSCOPY;  Service: Endoscopy;  Laterality: N/A;   EYE SURGERY     GROIN DEBRIDEMENT Bilateral 12/16/2021   Procedure: IRRIGATION AND DEBRIDEMENT OF BILATERAL GROINS;  Surgeon: Serene Gaile ORN, MD;  Location: [facility] OR;  Service: Vascular;  Laterality: Bilateral;   INCISION AND DRAINAGE OF WOUND Bilateral 12/13/2021   Procedure: IRRIGATION AND DEBRIDEMENT OF BILATERAL GROIN WOUNDS;  Surgeon: Lanis Fonda BRAVO, MD;  Location: [facility] OR;  Service: Vascular;  Laterality: Bilateral;   INSERTION OF ILIAC STENT Bilateral 11/26/2021   Procedure: BILATERAL ILIAC STENTING USING 21mmX80mm INNOVA STENT ON LEFT ILIAC AND 76mmX59mm, 11mmX79mm VBX AND 42mmX7.5cm VIABAHN STENT ON RIGHT ILIAC;  Surgeon: Serene Gaile ORN, MD;  Location: [facility] OR;  Service: Vascular;  Laterality: Bilateral;   INSERTION OF SEEDS IN PROSTATE     ORIF ANKLE FRACTURE Right    PERIPHERAL INTRAVASCULAR LITHOTRIPSY  08/08/2023   Procedure: PERIPHERAL INTRAVASCULAR LITHOTRIPSY;  Surgeon: Serene Gaile ORN, MD;  Location: [facility];  Service: Cardiovascular;;   PROSTATE BIOPSY N/A 04/01/2021   Procedure: PROSTATE BIOPSY GRAYCE;  Surgeon: Kassie Ozell SAUNDERS, MD;  Location: [facility] ORS;  Service: Urology;  Laterality: N/A;   VEIN HARVEST Bilateral 11/26/2021   Procedure: VEIN HARVEST OF BILATERAL SPAHENOUS VEINS;  Surgeon: Serene Gaile ORN, MD;  Location: [facility] OR;  Service: Vascular;  Laterality: Bilateral;   WOUND DEBRIDEMENT Bilateral 12/22/2021   Procedure: INCISION AND DEBRIDEMENT OF BILATERAL GROINS;  Surgeon: Serene Gaile ORN, MD;  Location: [facility] OR;  Service: Vascular;  Laterality: Bilateral;    Prior to Admission medications   Medication Sig Start Date End Date Taking? Authorizing Provider  allopurinol  (ZYLOPRIM )  100 MG tablet Take 150 mg by mouth daily.   Yes [provider]  amiodarone  (PACERONE ) 200 MG tablet Take 200 mg by mouth 2 (two) times daily.   Yes [provider]  APIXABAN  (ELIQUIS ) VTE STARTER PACK (10MG  AND 5MG ) Take as directed on package: start with two-5mg  tablets twice daily for 7 days. On day 8, switch to one-5mg  tablet twice daily. 05/24/24  Yes Jens Durand, MD  aspirin  EC 81 MG tablet Take 81 mg by mouth every morning.   Yes [provider]  benzonatate (TESSALON) 100 MG capsule Take 200 mg by mouth 3 (three) times daily as needed. 05/17/24  Yes [provider]  Cholecalciferol  (VITAMIN D3) 25 MCG (1000 UT) CAPS Take 2,000 Units by mouth daily.   Yes [provider]  ezetimibe  (ZETIA ) 10 MG tablet Take 1 tablet (10 mg total) by mouth daily. 04/10/18  Yes Gollan, Timothy J, MD  iron  polysaccharides (NIFEREX) 150 MG capsule Take 1 capsule (150 mg total) by mouth 2 (two) times daily. 03/11/24 06/09/24 Yes Sreenath, Calvin NOVAK, MD  midodrine  (PROAMATINE ) 10 MG tablet Take 1 tablet (10 mg total) by mouth 3 (three) times daily with meals. 05/24/24  Yes Jens Durand, MD  pantoprazole  (PROTONIX ) 40 MG tablet Take 40 mg by mouth daily.   Yes [provider]  simvastatin  (ZOCOR ) 20 MG tablet Take 20 mg by mouth in the morning.  Yes [provider]  triamcinolone  ointment (KENALOG ) 0.1 % Apply 1 Application topically 2 (two) times daily as needed (psoriasis). 07/18/23  Yes [provider]  zolpidem  (AMBIEN ) 10 MG tablet Take 10 mg by mouth at bedtime.   Yes [provider]    Allergies as of 05/29/2024 - Review Complete 05/29/2024  Allergen Reaction Noted   Jardiance [empagliflozin]  08/02/2023    Family History  Problem Relation Age of Onset   Heart disease Mother    Heart disease Brother     Social History   Socioeconomic History   Marital status: Married    Spouse name: Adelle   Number of children: 2    Years of education: Not on file   Highest education level: Not on file  Occupational History   Occupation: disabled  Tobacco Use   Smoking status: Former    Current packs/day: 0.00    Average packs/day: 1 pack/day for 47.0 years (47.0 ttl pk-yrs)    Types: Cigarettes    Start date: 41    Quit date: 2016    Years since quitting: 9.7    Passive exposure: Never   Smokeless tobacco: Never  Vaping Use   Vaping status: Never Used  Substance and Sexual Activity   Alcohol use: Not Currently   Drug use: No   Sexual activity: Not Currently    Birth control/protection: Other-see comments    Comment: ED  Other Topics Concern   Not on file  Social History Narrative   Not on file   Social Drivers of Health   Financial Resource Strain: Low Risk  (07/17/2023)   Overall Financial Resource Strain (CARDIA)    Difficulty of Paying Living Expenses: Not hard at all  Food Insecurity: No Food Insecurity (05/29/2024)   Hunger Vital Sign    Worried About Running Out of Food in the Last Year: Never true    Ran Out of Food in the Last Year: Never true  Transportation Needs: No Transportation Needs (05/29/2024)   PRAPARE - Administrator, Civil Service (Medical): No    Lack of Transportation (Non-Medical): No  Physical Activity: Not on file  Stress: Not on file  Social Connections: Moderately Isolated (05/29/2024)   Social Connection and Isolation Panel    Frequency of Communication with Friends and Family: More than three times a week    Frequency of Social Gatherings with Friends and Family: More than three times a week    Attends Religious Services: Never    Database administrator or Organizations: No    Attends Banker Meetings: Never    Marital Status: Married  Catering manager Violence: Not At Risk (05/29/2024)   Humiliation, Afraid, Rape, and Kick questionnaire    Fear of Current or Ex-Partner: No    Emotionally Abused: No    Physically Abused: No    Sexually  Abused: No    Review of Systems: See HPI, otherwise negative ROS  Physical Exam: BP 98/66   Pulse (!) 58   Temp 97.8 F (36.6 C) (Axillary)   Resp 14   Ht 5' 9 (1.753 m)   Wt 106.6 kg   SpO2 100%   BMI 34.71 kg/m  General:   Alert,  pleasant and cooperative in NAD Head:  Normocephalic and atraumatic. Neck:  Supple; no masses or thyromegaly. Lungs:  Clear throughout to auscultation, normal respiratory effort.    Heart:  +S1, +S2, Regular rate and rhythm, No edema. Abdomen:  Soft,  nontender and nondistended. Normal bowel sounds, without guarding, and without rebound.   Neurologic:  Alert and  oriented x4;  grossly normal neurologically.  Impression/Plan: Kevin Gross is here for an endoscopy  to be performed for  evaluation of GI bleed.     Risks, benefits, limitations, and alternatives regarding endoscopy have been reviewed with the patient.  Questions have been answered.  All parties agreeable.   Ruel Kung, MD  06/05/2024, 8:52 AM

## 2024-06-05 NOTE — Progress Notes (Signed)
 NAME:  Kevin Gross, MRN:  [id number], DOB:  August 31, 1955, LOS: 7 ADMISSION DATE:  05/29/2024, CONSULTATION DATE:  05/29/2024 REFERRING MD:  Dr. Claudene, CHIEF COMPLAINT:  Generalized Weakness & Hypotension    Brief Pt Description / Synopsis:  68 y.o. male with PMHx significant for ESRD on HD, HFpEF, COPD, OSA, Alcoholic Cirrhosis, and portal vein thrombosis, who is admitted with Multifactorial shock (hypovolemic/hemorrhagic vs septic), Sepsis of unknown etiology (questionable LLE cellulitis vs Pneumonia vs SBP), Acute GI Bleed, and Acute Decompensated Cirrhosis.   History of Present Illness:  Kevin Gross is a 68 y.o. male who presented to [facility] ED via EMS from his home to be evaluated for hypotension and weakness. He reports his initial outpatient hemodialysis appointment was 05/28/2024 at [facility]. This initial appointment is following a recent admission at [facility], of which he was discharged on 05/24/24.   His overall weakness began yesterday following his dialysis appointment and has continued today, with the addition of upper URI symptoms to include minor congestion and a sore throat as of this morning. Upon EMS arrival, he was found to have a BP of 64/28, to which he received fluids and BP increased to 94/42. He reported a sore throat and generalized fatigue/weakness that resulted in a fall this morning. He also stated at baseline he has no issue ambulating with a walker.  06/04/24-patient resting in bed comfortably. He remains hypotensive on vasopressors and dialysis.   06/05/24- patient went for EGD today. I discussed findings with GI Dr Therisa and there is trichomonal infection of pharynx and esophagus which may be seen with severe immunocompromised patients.  He is communicative.  He has oral mucosal sloughing and oral thrush as well.    Pertinent  Medical History  ETOH Hypertensin Hyperlipidemia Congestive Heart Failure End Stage Renal Disease  Micro Data:  05/29/24: Blood cultures x2  >> no growth to date  05/29/24: MRSA PCR >> negative 05/29/24: Group A Strep PCR >> negative 05/29/24: COVID/FLU/RSV PCR >> negative 05/29/24: Legionella Urine Antigen >> 05/29/24: Strep Pneumo Urine Antigen >> 05/30/24: Viral Respiratory Panel >> negative   Antimicrobials:   Anti-infectives (From admission, onward)    Start     Dose/Rate Route Frequency Ordered Stop   05/31/24 1800  piperacillin -tazobactam (ZOSYN ) IVPB 3.375 g  Status:  Discontinued        3.375 g 12.5 mL/hr over 240 Minutes Intravenous Every 6 hours 05/31/24 1300 05/31/24 1305   05/31/24 1400  piperacillin -tazobactam (ZOSYN ) IVPB 3.375 g        3.375 g 100 mL/hr over 30 Minutes Intravenous Every 6 hours 05/31/24 1306 06/07/24 1159   05/29/24 2200  piperacillin -tazobactam (ZOSYN ) IVPB 2.25 g  Status:  Discontinued        2.25 g 100 mL/hr over 30 Minutes Intravenous Every 8 hours 05/29/24 1752 05/31/24 1300   05/29/24 1400  piperacillin -tazobactam (ZOSYN ) IVPB 3.375 g        3.375 g 100 mL/hr over 30 Minutes Intravenous  Once 05/29/24 1353 05/29/24 1432   05/29/24 1400  vancomycin  (VANCOREADY) IVPB 2000 mg/400 mL        2,000 mg 200 mL/hr over 120 Minutes Intravenous  Once 05/29/24 1353 05/29/24 1600       Significant Hospital Events: Including procedures, antibiotic start and stop dates in addition to other pertinent events   05/29/24: Started on Levophed  in the ED following refractory hypotension. Midodrine  10mg  x 1. 1.5L fluids given without improvement. Transferred to the ICU for further management. 2L  [place] started with improvement in O2.  05/30/24: Remains on levophed  and started on vaso overnight. Minimal oxygen requirements. Oliguric with plan for HD today at 11.  Abdominal Arterial Ultrasound with patent portal vein and hepatopetal flow (PORTAL VEIN THROMBOSIS RESOLVED), notes cirrhosis and moderate to large ascites.  05/31/24: No significant events overnight, nursing noted several small episodes of melena.  Hgb  down to 6.9 this am, giving 1 unit pRBCs.  Remains on Levophed  and Vasopressin , start stress dose steroids and give Albumin .  Ultrasound yesterday negative for portal vein thrombosis, will give Vitamin K  for supratherapeutic INR.  Nephrology starting CRRT.  10/4 shock and on pressors 10/5 remains on pressors and CRRT  Interim History / Subjective:  Remains critically ill liver cirrhosis and chronic kidney disease   Objective   Blood pressure 98/66, pulse (!) 58, temperature 97.8 F (36.6 C), temperature source Axillary, resp. rate 14, height 5' 9 (1.753 m), weight 106.6 kg, SpO2 100%. CVP:  [5 mmHg-12 mmHg] 8 mmHg      Intake/Output Summary (Last 24 hours) at 06/05/2024 0909 Last data filed at 06/05/2024 0800 Gross per 24 hour  Intake 1580.85 ml  Output 4301 ml  Net -2720.15 ml   Filed Weights   06/02/24 0500 06/04/24 0332 06/05/24 0416  Weight: 115.6 kg 112 kg 106.6 kg     Physical Examination:   General Appearance: No distress  EYES PERRLA, EOM intact.  Icteric sclera NECK Supple, No JVD Pulmonary: normal breath sounds, No wheezing.  CardiovascularNormal S1,S2.  No m/r/g.   Abdomen: Benign, Soft, non-tender. Neurology UE/LE 5/5 strength, no focal deficits Ext pulses intact, cap refill intact +edema ALL OTHER ROS ARE NEGATIVE     Assessment & Plan:  69 yo morbidly obese male admitted for decompensated liver cirrhosis and failure with progressive end stage kidney failure With severe shock hypovolemic/Hemorrhagic vs Septic exacerbated by Decompensated Liver Cirrhosis    Shock: HFpEF without acute exacerbation  Mildly Elevated Troponin due to demand ischemia  PMHx: Bradycardia, HTN, HLD, Paroxsymal A. Fib, prolonged QT -Echocardiogram 05/16/24: LVEF 60-65%, mild LVH, normal diastolic parameters, RV systolic function normal, RV size normal, normal pulmonary artery systolic pressure  -Continuous cardiac monitoring -Vasopressors as needed to maintain MAP goal -Volume  removal with renal replacement therapy  Mild protein calorie malnutrition         - monitor for re-feeding syndrome post EGD            Sepsis of Unknown Etiology Questionable Cellulitis of LLE vs Pneumonia vs Spontaneous Bacterial Peritonitis  -Follow cultures as above -Continue empiric Zosyn  pending cultures & sensitivities  ESRD on Hemodialysis  Anion Gap Metabolic Acidosis  -Monitor I&O's / urinary output -Follow BMP -Ensure adequate renal perfusion -Avoid nephrotoxic agents as able -CRRT as per Nephrology   Suspected Acute GI Bleed superimposed on Chronic Iron  Deficiency Anemia Thrombocytopenia, suspect due to cirrhosis  Supratherapeutic INR now 2.4 -SCD's for VTE Prophylaxis  -Transfuse for Hgb <7 ~ status post 1 unit of pRBCs and 1 FFP ~ plan for 1 unit pRBC on 10/3 -Transfuse Platelets for platelet count <10K; <50K with active bleeding; <100K for Neurosurgical procedures -Vitamin K  10/3  Alcoholic Liver Cirrhosis Acute Decompensated Liver Cirrhosis Transaminitis Hypoalbuminemia Portal Vein Thrombosis (Identified during previous admission) ~ RESOLVED  Hx: ETOH abuse -05/29/24 CT Chest/Abdomen/Pelvis: small bilateral pleural effusions, liver cirrhosis, distended gallbladder with small stones, exophytic lesions of left kidney, aortic atherosclerosis. -05/30/24 Abdominal Pelvic Arterial/Venous Flow Doppler: shows resolution of previous portal vein thrombus,  also notes cirrhosis and moderate to large volume of ascites  -Continue Protonix  BID -Holding Eliquis  currently until active GI Bleed ruled out -GI following, appreciate input  No need for paracentesis at this time    OVERALL POOR PROGNOSIS PATIENT WITH MULTIORGAN FAILURE   Best Practice (right click and Reselect all SmartList Selections daily)   Diet/type: Regular consistency (see orders) DVT prophylaxis: SCD GI prophylaxis: PPI Lines: Central line, Dialysis Catheter, and yes and it is still needed Foley:   Yes, and it is still needed Code Status:  full code DAILY multidisciplinary goals of care      Labs   CBC: Recent Labs  Lab 05/29/24 1256 05/29/24 2040 05/30/24 0338 06/01/24 0457 06/01/24 1125 06/02/24 0505 06/02/24 1227 06/03/24 0500 06/03/24 0503 06/04/24 0517 06/04/24 1203 06/04/24 1857 06/04/24 2337 06/05/24 0520  WBC 21.1* 32.3*   < > 23.0*  --  23.0*  --  28.6*  --  21.8*  --   --   --  25.1*  NEUTROABS 18.6* 27.5*  --   --   --   --   --   --   --   --   --   --   --   --   HGB 7.9* 7.7*   < > 7.5*   < > 7.3*  7.4*   < > 8.1*   < > 7.4* 7.4* 7.8* 8.1* 7.9*  HCT 24.9* 24.4*   < > 22.5*   < > 22.4*  22.5*   < > 24.9*   < > 23.6* 23.4* 24.5* 25.0* 25.0*  MCV 106.9* 107.0*   < > 96.6  --  99.6  --  101.2*  --  103.1*  --   --   --  102.5*  PLT 117* 180   < > 107*  --  98*  --  106*  --  81*  --   --   --  89*   < > = values in this interval not displayed.    Basic Metabolic Panel: Recent Labs  Lab 06/01/24 0457 06/01/24 1545 06/02/24 0505 06/02/24 1630 06/03/24 0500 06/03/24 1621 06/04/24 0517 06/04/24 1621 06/05/24 0520  NA 133*   < > 135   < > 135 135 135 133* 135  K 3.9   < > 3.9   < > 4.0 4.0 4.1 4.0 4.0  CL 98   < > 100   < > 102 100 102 101 100  CO2 25   < > 24   < > 25 23 25 23 24   GLUCOSE 134*   < > 129*   < > 151* 133* 122* 157* 130*  BUN 44*   < > 32*   < > 30* 31* 29* 33* 37*  CREATININE 3.49*   < > 2.37*   < > 1.80* 1.80* 1.60* 1.73* 1.93*  CALCIUM  8.0*   < > 8.2*   < > 8.2* 8.0* 8.2* 8.1* 8.3*  MG 2.3  --  2.5*  --  2.5*  --  2.7*  --  2.6*  PHOS 3.1   < > 2.8   < > 3.2 3.9 2.4* 3.3 3.3   < > = values in this interval not displayed.   GFR: Estimated Creatinine Clearance: 45.3 mL/min (A) (by C-G formula based on SCr of 1.93 mg/dL (H)). Recent Labs  Lab 05/29/24 1256 05/29/24 1458 05/31/24 1028 05/31/24 1451 06/01/24 0457 06/01/24 1125 06/01/24 1410 06/02/24 0505 06/03/24 0500 06/04/24 9482  06/05/24 0520  PROCALCITON 2.62  --    --   --   --   --   --   --   --   --   --   WBC 21.1*   < >  --   --    < >  --   --  23.0* 28.6* 21.8* 25.1*  LATICACIDVEN 5.8*   < > 2.7* 2.6*  --  2.0* 2.0*  --   --   --   --    < > = values in this interval not displayed.    Liver Function Tests: Recent Labs  Lab 05/29/24 1256 05/30/24 0338 05/31/24 0459 05/31/24 1558 06/03/24 0500 06/03/24 1621 06/04/24 0517 06/04/24 1621 06/05/24 0520  AST 301* 270* 250*  --   --   --   --   --   --   ALT 90* 91* 94*  --   --   --   --   --   --   ALKPHOS 226* 205* 206*  --   --   --   --   --   --   BILITOT 3.1* 3.6* 5.4*  --   --   --   --   --   --   PROT 4.9* 4.4* 4.6*  --   --   --   --   --   --   ALBUMIN  2.3* 2.0* 2.1*   < > 2.9* 2.8* 3.1* 2.9* 2.9*   < > = values in this interval not displayed.   No results for input(s): LIPASE, AMYLASE in the last 168 hours. No results for input(s): AMMONIA in the last 168 hours.  ABG    Component Value Date/Time   PHART 7.298 (L) 11/27/2021 0345   PCO2ART 39.7 11/27/2021 0345   PO2ART 116 (H) 11/27/2021 0345   HCO3 21.9 05/29/2024 2040   TCO2 21 (L) 08/08/2023 0916   ACIDBASEDEF 2.8 (H) 05/29/2024 2040   O2SAT 73.5 06/01/2024 1125     Coagulation Profile: Recent Labs  Lab 06/01/24 0457 06/02/24 0505 06/03/24 0500 06/04/24 0517 06/05/24 0520  INR 3.5* 2.4* 2.0* 1.7* 1.7*    Cardiac Enzymes: No results for input(s): CKTOTAL, CKMB, CKMBINDEX, TROPONINI in the last 168 hours.  HbA1C: Hgb A1c MFr Bld  Date/Time Value Ref Range Status  11/27/2021 03:28 AM 5.6 4.8 - 5.6 % Final    Comment:    (NOTE) Pre diabetes:          5.7%-6.4%  Diabetes:              >6.4%  Glycemic control for   <7.0% adults with diabetes     CBG: Recent Labs  Lab 05/29/24 1540  GLUCAP 113*      DVT/GI PRX  assessed I Assessed the need for Labs I Assessed the need for Foley I Assessed the need for Central Venous Line Family Discussion when available I Assessed the need  for Mobilization I made an Assessment of medications to be adjusted accordingly Safety Risk assessment completed   Critical care provider statement:   Total critical care time: 33 minutes   Performed by: Parris MD   Critical care time was exclusive of separately billable procedures and treating other patients.   Critical care was necessary to treat or prevent imminent or life-threatening deterioration.   Critical care was time spent personally by me on the following activities: development of treatment plan with patient and/or surrogate as well as  nursing, discussions with consultants, evaluation of patient's response to treatment, examination of patient, obtaining history from patient or surrogate, ordering and performing treatments and interventions, ordering and review of laboratory studies, ordering and review of radiographic studies, pulse oximetry and re-evaluation of patient's condition.    Doctor Sheahan, M.D.  Pulmonary & Critical Care Medicine

## 2024-06-05 NOTE — Transfer of Care (Signed)
 Immediate Anesthesia Transfer of Care Note  Patient: Kevin Gross  Procedure(s) Performed: EGD (ESOPHAGOGASTRODUODENOSCOPY)  Patient Location: PACU  Anesthesia Type:General  Level of Consciousness: awake and sedated  Airway & Oxygen Therapy: Patient Spontanous Breathing and Patient connected to nasal cannula oxygen  Post-op Assessment: Report given to RN and Post -op Vital signs reviewed and stable  Post vital signs: Reviewed and stable  Last Vitals:  Vitals Value Taken Time  BP 114/56 06/05/24 10:06  Temp 35.3 C 06/05/24 10:06  Pulse 58 06/05/24 10:06  Resp 12 06/05/24 10:06  SpO2 100 % 06/05/24 10:06    Last Pain:  Vitals:   06/05/24 1006  TempSrc: Tympanic  PainSc: Asleep      Patients Stated Pain Goal: 0 (05/31/24 1800)  Complications: There were no known notable events for this encounter.

## 2024-06-05 NOTE — Consult Note (Signed)
 NAME: Kevin Gross  DOB: 1956-01-17  MRN: [id number]  Date/Time: 06/05/2024 5:47 PM  REQUESTING PROVIDER: Dr.Kiran Therisa Subjective:  REASON FOR CONSULT: trichomonas esophagitis ? Kevin Gross is a 68 y.o. male with a history of decompensated liver cirrhosis was recently in the hospital 9/17-9/26/25 for AKI on CKD for which he had to start dialysis, pneumonia, alcoholic cirrhosis of liver with ascites, portal vein thrombosis was discharged home on 05/24/2024 and he returned to the ED on 05/29/2024 for generalized weakness.  He had gone to his first hemodialysis 2 days ago and completed the session.  He was also complaining of sore throat some difficulty in swallowing and some congestion.  He had a fall at home  When EMS arrived he was found to have a BP of 64/28 they received fluids and BP increased to 94/42. In the ED vitals  05/29/24 12:54  BP 81/36 (L)  Temp 98.1 F (36.7 C)  Pulse Rate 63  Resp 18  SpO2 93 %   Labs were  Latest Reference Range & Units 05/29/24 12:56  WBC 4.0 - 10.5 K/uL 21.1 (H)  Hemoglobin 13.0 - 17.0 g/dL 7.9 (L)  HCT 60.9 - 47.9 % 24.9 (L)  Platelets 150 - 400 K/uL 117 (L)  Creatinine 0.61 - 1.24 mg/dL 3.27 (H)   Lactate 5.8 AST 301/ALT 90/Alk 226 and TB 3.1 Blood culture sent Res p panel neg Patient was started on broad-spectrum antibiotic vancomycin  and Zosyn . Was admitted to ICU Was placed on CRRT GI saw him because of melena Today underwent endoscopy and there was extensive thrush patches in the esophagus Brushing was sent for KOH prep and that came back as yeast and it was also trichomonas and I am asked to see the patient Patient lives with his wife and daughter He states he has not been sexually active in many years   Past Medical History:  Diagnosis Date   Alcoholic cirrhosis of liver (HCC)    Anemia 2024   Cataract 2024   CHF (congestive heart failure) (HCC)    Chronic gouty arthritis    CKD (chronic kidney disease), stage III (HCC)     Clotting disorder    Bilateral legs-pt states plaque build up in both legs requiring intervention   Complication of anesthesia    pt states that years ago during colonoscopy at [facility] they had to abort the procedure due to some cardiac issue had to see cardiologist   COPD (chronic obstructive pulmonary disease) (HCC)    Hyperlipidemia    Hypertension    Hyperuricemia    Peripheral arterial disease    Pre-diabetes    Prostate cancer (HCC) 2023   Sleep apnea    uses cpap    Past Surgical History:  Procedure Laterality Date   ABDOMINAL AORTOGRAM W/LOWER EXTREMITY N/A 10/20/2021   Procedure: ABDOMINAL AORTOGRAM W/LOWER EXTREMITY;  Surgeon: Darron Deatrice LABOR, MD;  [facility];  Service: Cardiovascular;  Laterality: N/A;   ABDOMINAL AORTOGRAM W/LOWER EXTREMITY N/A 08/08/2023   Procedure: ABDOMINAL AORTOGRAM W/LOWER EXTREMITY;  Surgeon: Serene Gaile ORN, MD;  Location: [facility];  Service: Cardiovascular;  Laterality: N/A;   ALLOGRAFT APPLICATION Bilateral 12/22/2021   Procedure: ALLOGRAFT APPLICATION;  Surgeon: Serene Gaile ORN, MD;  Location: [facility] OR;  Service: Vascular;  Laterality: Bilateral;   ANGIOPLASTY Left 11/26/2021   Procedure: LEFT INFRARENAL LITHOTRIPSY;  Surgeon: Serene Gaile ORN, MD;  Location: [facility] OR;  Service: Vascular;  Laterality: Left;   APPLICATION OF WOUND  VAC Bilateral 12/13/2021   Procedure: APPLICATION OF WOUND VAC;  Surgeon: Lanis Fonda BRAVO, MD;  Location: [facility] OR;  Service: Vascular;  Laterality: Bilateral;   CARDIAC CATHETERIZATION     [facility]   CATARACT EXTRACTION W/PHACO Left 04/04/2023   Procedure: CATARACT EXTRACTION PHACO AND INTRAOCULAR LENS PLACEMENT (IOC) LEFT 8.71 00:45.5;  Surgeon: Jaye Fallow, MD;  Location: [facility];  Service: Ophthalmology;  Laterality: Left;   CATARACT EXTRACTION W/PHACO Right 04/18/2023   Procedure: CATARACT EXTRACTION PHACO AND INTRAOCULAR LENS PLACEMENT (IOC) RIGHT 4.31 00:8.2;  Surgeon:  Jaye Fallow, MD;  Location: [facility];  Service: Ophthalmology;  Laterality: Right;   COLONOSCOPY     COLONOSCOPY WITH PROPOFOL  N/A 01/17/2018   Procedure: COLONOSCOPY WITH PROPOFOL ;  Surgeon: Toledo, Ladell POUR, MD;  Location: [facility] ENDOSCOPY;  Service: Gastroenterology;  Laterality: N/A;   COLONOSCOPY WITH PROPOFOL  N/A 09/12/2022   Procedure: COLONOSCOPY WITH PROPOFOL ;  Surgeon: Maryruth Ole DASEN, MD;  Location: [facility] ENDOSCOPY;  Service: Endoscopy;  Laterality: N/A;   COLONOSCOPY WITH PROPOFOL  N/A 09/05/2023   Procedure: COLONOSCOPY WITH PROPOFOL ;  Surgeon: Maryruth Ole DASEN, MD;  Location: [facility] ENDOSCOPY;  Service: Endoscopy;  Laterality: N/A;   DIALYSIS/PERMA CATHETER INSERTION Right 05/21/2024   Procedure: DIALYSIS/PERMA CATHETER INSERTION;  Surgeon: Jama Cordella MATSU, MD;  Location: [facility];  Service: Cardiovascular;  Laterality: Right;   ENDARTERECTOMY FEMORAL Bilateral 11/26/2021   Procedure: BILATERAL FEMORAL ENDARTERECTOMY WITH VEIN PATCH ANGIOPLASTY;  Surgeon: Serene Gaile ORN, MD;  [facility];  Service: Vascular;  Laterality: Bilateral;   ENTEROSCOPY N/A 09/05/2023   Procedure: ENTEROSCOPY;  Surgeon: Maryruth Ole DASEN, MD;  Location: [facility] ENDOSCOPY;  Service: Endoscopy;  Laterality: N/A;   ESOPHAGOGASTRODUODENOSCOPY N/A 06/05/2024   Procedure: EGD (ESOPHAGOGASTRODUODENOSCOPY);  Surgeon: Therisa Bi, MD;  Location: [facility] ENDOSCOPY;  Service: Gastroenterology;  Laterality: N/A;  IC-10   ESOPHAGOGASTRODUODENOSCOPY (EGD) WITH PROPOFOL  N/A 01/17/2018   Procedure: ESOPHAGOGASTRODUODENOSCOPY (EGD) WITH PROPOFOL ;  Surgeon: Toledo, Ladell POUR, MD;  Location: [facility] ENDOSCOPY;  Service: Gastroenterology;  Laterality: N/A;   ESOPHAGOGASTRODUODENOSCOPY (EGD) WITH PROPOFOL  N/A 09/12/2022   Procedure: ESOPHAGOGASTRODUODENOSCOPY (EGD) WITH PROPOFOL ;  Surgeon: Maryruth Ole DASEN, MD;  Location: [facility] ENDOSCOPY;  Service: Endoscopy;  Laterality: N/A;   EYE SURGERY      GROIN DEBRIDEMENT Bilateral 12/16/2021   Procedure: IRRIGATION AND DEBRIDEMENT OF BILATERAL GROINS;  Surgeon: Serene Gaile ORN, MD;  [facility];  Service: Vascular;  Laterality: Bilateral;   INCISION AND DRAINAGE OF WOUND Bilateral 12/13/2021   Procedure: IRRIGATION AND DEBRIDEMENT OF BILATERAL GROIN WOUNDS;  Surgeon: Lanis Fonda BRAVO, MD;  Location: [facility] OR;  Service: Vascular;  Laterality: Bilateral;   INSERTION OF ILIAC STENT Bilateral 11/26/2021   Procedure: BILATERAL ILIAC STENTING USING 68mmX80mm INNOVA STENT ON LEFT ILIAC AND 34mmX59mm, 71mmX79mm VBX AND 33mmX7.5cm VIABAHN STENT ON RIGHT ILIAC;  Surgeon: Serene Gaile ORN, MD;  Location[facility];  Service: Vascular;  Laterality: Bilateral;   INSERTION OF SEEDS IN PROSTATE     ORIF ANKLE FRACTURE Right    PERIPHERAL INTRAVASCULAR LITHOTRIPSY  08/08/2023   Procedure: PERIPHERAL INTRAVASCULAR LITHOTRIPSY;  Surgeon: Serene Gaile ORN, MD;  [facility];  Service: Cardiovascular;;   PROSTATE BIOPSY N/A 04/01/2021   Procedure: PROSTATE BIOPSY GRAYCE;  Surgeon: Kassie Ozell SAUNDERS, MD;  Location: [facility] ORS;  Service: Urology;  Laterality: N/A;   VEIN HARVEST Bilateral 11/26/2021   Procedure: VEIN HARVEST OF BILATERAL SPAHENOUS VEINS;  Surgeon: Serene Gaile ORN, MD;  [facility];  Service: Vascular;  Laterality: Bilateral;   WOUND DEBRIDEMENT Bilateral 12/22/2021   Procedure: INCISION AND DEBRIDEMENT OF BILATERAL GROINS;  Surgeon: Serene Gaile ORN, MD;  [facility];  Service: Vascular;  Laterality: Bilateral;    Social History   Socioeconomic History   Marital status: Married    Spouse name: Adelle   Number of children: 2   Years of education: Not on file   Highest education level: Not on file  Occupational History   Occupation: disabled  Tobacco Use   Smoking status: Former    Current packs/day: 0.00    Average packs/day: 1 pack/day for 47.0 years (47.0 ttl pk-yrs)    Types: Cigarettes    Start date: 47    Quit date:  2016    Years since quitting: 9.7    Passive exposure: Never   Smokeless tobacco: Never  Vaping Use   Vaping status: Never Used  Substance and Sexual Activity   Alcohol use: Not Currently   Drug use: No   Sexual activity: Not Currently    Birth control/protection: Other-see comments    Comment: ED  Other Topics Concern   Not on file  Social History Narrative   Not on file   Social Drivers of Health   Financial Resource Strain: Low Risk  (07/17/2023)   Overall Financial Resource Strain (CARDIA)    Difficulty of Paying Living Expenses: Not hard at all  Food Insecurity: No Food Insecurity (05/29/2024)   Hunger Vital Sign    Worried About Running Out of Food in the Last Year: Never true    Ran Out of Food in the Last Year: Never true  Transportation Needs: No Transportation Needs (05/29/2024)   PRAPARE - Administrator, Civil Service (Medical): No    Lack of Transportation (Non-Medical): No  Physical Activity: Not on file  Stress: Not on file  Social Connections: Moderately Isolated (05/29/2024)   Social Connection and Isolation Panel    Frequency of Communication with Friends and Family: More than three times a week    Frequency of Social Gatherings with Friends and Family: More than three times a week    Attends Religious Services: Never    Database administrator or Organizations: No    Attends Banker Meetings: Never    Marital Status: Married  Catering manager Violence: Not At Risk (05/29/2024)   Humiliation, Afraid, Rape, and Kick questionnaire    Fear of Current or Ex-Partner: No    Emotionally Abused: No    Physically Abused: No    Sexually Abused: No    Family History  Problem Relation Age of Onset   Heart disease Mother    Heart disease Brother    Allergies  Allergen Reactions   Jardiance [Empagliflozin]     Loss of balance, hypoglycemic    I? Current Facility-Administered Medications  Medication Dose Route Frequency Provider Last  Rate Last Admin   alteplase  (CATHFLO ACTIVASE ) injection 2 mg  2 mg Intracatheter Once PRN Druscilla Bald, NP       alteplase  (CATHFLO ACTIVASE ) injection 2 mg  2 mg Intracatheter Once PRN Druscilla Bald, NP       ascorbic acid  (VITAMIN C ) tablet 500 mg  500 mg Oral BID Kasa, Kurian, MD   500 mg at 06/05/24 1110   Chlorhexidine  Gluconate Cloth 2 % PADS 6 each  6 each Topical Daily Bousman, Karlie, PA-C   6 each at 06/04/24 1000   docusate sodium  (COLACE) capsule 100 mg  100  mg Oral BID PRN Bousman, Karlie, PA-C       epoetin  alfa-epbx (RETACRIT ) injection 10,000 Units  10,000 Units Subcutaneous Q T,Th,Sat-1800 Breeze, Faith, NP   10,000 Units at 06/04/24 1843   feeding supplement (NEPRO CARB STEADY) liquid 237 mL  237 mL Oral TID BM Kasa, Kurian, MD   237 mL at 06/04/24 1400   fluconazole (DIFLUCAN) IVPB 400 mg  400 mg Intravenous Once Zeigler, Dustin G, RPH       [START ON 06/06/2024] fluconazole (DIFLUCAN) IVPB 400 mg  400 mg Intravenous Q24H Zeigler, Dustin G, RPH       And   [START ON 06/06/2024] fluconazole (DIFLUCAN) IVPB 400 mg  400 mg Intravenous Q24H Zeigler, Dustin G, RPH       heparin  injection 1,000 Units  1,000 Units Intracatheter PRN Druscilla Faith, NP       heparin  injection 1,000 Units  1,000 Units Intracatheter PRN Druscilla Faith, NP       heparin  injection 1,000-6,000 Units  1,000-6,000 Units CRRT PRN Druscilla Faith, NP   5,000 Units at 06/05/24 0912   hydrocortisone sodium succinate (SOLU-CORTEF) 100 MG injection 50 mg  50 mg Intravenous Q6H Keene, Jeremiah D, NP   50 mg at 06/05/24 1600   magic mouthwash  5 mL Oral QID Aleskerov, Fuad, MD   5 mL at 06/05/24 1349   metroNIDAZOLE (FLAGYL) IVPB 500 mg  500 mg Intravenous Q12H Fayette Bodily, MD 100 mL/hr at 06/05/24 1704 500 mg at 06/05/24 1704   midodrine  (PROAMATINE ) tablet 15 mg  15 mg Oral TID WC Aleskerov, Fuad, MD   15 mg at 06/05/24 1600   multivitamin (RENA-VIT) tablet 1 tablet  1 tablet Oral QHS  Kasa, Kurian, MD   1 tablet at 06/04/24 2120   norepinephrine  (LEVOPHED ) 16 mg in 250mL (0.064 mg/mL) premix infusion  0-40 mcg/min Intravenous Titrated Rust-Chester, Britton L, NP 2.81 mL/hr at 06/05/24 1700 3 mcg/min at 06/05/24 1700   nystatin (MYCOSTATIN) 100000 UNIT/ML suspension 500,000 Units  5 mL Oral QID Aleskerov, Fuad, MD   500,000 Units at 06/05/24 1349   Oral care mouth rinse  15 mL Mouth Rinse PRN Kathrene Almarie Bake, NP       pantoprazole  (PROTONIX ) injection 40 mg  40 mg Intravenous Q12H Rust-Chester, Britton L, NP   40 mg at 06/05/24 0844   piperacillin -tazobactam (ZOSYN ) IVPB 3.375 g  3.375 g Intravenous Q6H Kasa, Kurian, MD   Stopped at 06/05/24 1237   polyethylene glycol (MIRALAX  / GLYCOLAX ) packet 17 g  17 g Oral Daily PRN Bousman, Karlie, PA-C       prismasol BGK 4/2.5 infusion   CRRT Continuous Breeze, Faith, NP 100 mL/hr at 06/05/24 1059 New Bag at 06/05/24 1059   prismasol BGK 4/2.5 infusion   CRRT Continuous Druscilla Faith, NP 100 mL/hr at 06/05/24 1057 Started During Downtime at 06/05/24 1057   prismasol BGK 4/2.5 infusion   CRRT Continuous Druscilla Faith, NP 1,000 mL/hr at 06/05/24 1558 New Bag at 06/05/24 1558   thiamine  (VITAMIN B1) tablet 100 mg  100 mg Oral Daily Kasa, Kurian, MD   100 mg at 06/05/24 1110   vasopressin  (PITRESSIN) 20 Units in 100 mL (0.2 unit/mL) infusion-*FOR SHOCK*  0-0.04 Units/min Intravenous Continuous Rust-Chester, Britton L, NP 12 mL/hr at 06/05/24 1700 0.04 Units/min at 06/05/24 1700   zolpidem  (AMBIEN ) tablet 5 mg  5 mg Oral QHS PRN Kasa, Kurian, MD   5 mg at 06/04/24 2120     Abtx:  Anti-infectives (From admission, onward)    Start     Dose/Rate Route Frequency Ordered Stop   06/06/24 1600  fluconazole (DIFLUCAN) IVPB 400 mg       Placed in And Linked Group   400 mg 100 mL/hr over 120 Minutes Intravenous Every 24 hours 06/05/24 1617     06/06/24 1400  fluconazole (DIFLUCAN) IVPB 200 mg  Status:  Discontinued        200  mg 100 mL/hr over 60 Minutes Intravenous Every 24 hours 06/05/24 1558 06/05/24 1606   06/06/24 1400  fluconazole (DIFLUCAN) IVPB 400 mg       Placed in And Linked Group   400 mg 100 mL/hr over 120 Minutes Intravenous Every 24 hours 06/05/24 1617     06/05/24 2000  fluconazole (DIFLUCAN) IVPB 400 mg  Status:  Discontinued       Placed in And Linked Group   400 mg 100 mL/hr over 120 Minutes Intravenous Every 24 hours 06/05/24 1606 06/05/24 1617   06/05/24 1800  fluconazole (DIFLUCAN) IVPB 400 mg  Status:  Discontinued        400 mg 100 mL/hr over 120 Minutes Intravenous  Once 06/05/24 1558 06/05/24 1606   06/05/24 1800  fluconazole (DIFLUCAN) IVPB 400 mg  Status:  Discontinued       Placed in And Linked Group   400 mg 100 mL/hr over 120 Minutes Intravenous Every 24 hours 06/05/24 1606 06/05/24 1617   06/05/24 1800  fluconazole (DIFLUCAN) IVPB 400 mg        400 mg 100 mL/hr over 120 Minutes Intravenous  Once 06/05/24 1617     06/05/24 1700  metroNIDAZOLE (FLAGYL) IVPB 500 mg        500 mg 100 mL/hr over 60 Minutes Intravenous Every 12 hours 06/05/24 1558     06/05/24 1430  fluconazole (DIFLUCAN) tablet 400 mg  Status:  Discontinued        400 mg Oral Daily 06/05/24 1343 06/05/24 1353   05/31/24 1800  piperacillin -tazobactam (ZOSYN ) IVPB 3.375 g  Status:  Discontinued        3.375 g 12.5 mL/hr over 240 Minutes Intravenous Every 6 hours 05/31/24 1300 05/31/24 1305   05/31/24 1400  piperacillin -tazobactam (ZOSYN ) IVPB 3.375 g        3.375 g 100 mL/hr over 30 Minutes Intravenous Every 6 hours 05/31/24 1306 06/07/24 1159   05/29/24 2200  piperacillin -tazobactam (ZOSYN ) IVPB 2.25 g  Status:  Discontinued        2.25 g 100 mL/hr over 30 Minutes Intravenous Every 8 hours 05/29/24 1752 05/31/24 1300   05/29/24 1400  piperacillin -tazobactam (ZOSYN ) IVPB 3.375 g        3.375 g 100 mL/hr over 30 Minutes Intravenous  Once 05/29/24 1353 05/29/24 1432   05/29/24 1400  vancomycin  (VANCOREADY)  IVPB 2000 mg/400 mL        2,000 mg 200 mL/hr over 120 Minutes Intravenous  Once 05/29/24 1353 05/29/24 1600       REVIEW OF SYSTEMS:  Const: negative fever, negative chills, + weight loss Eyes: negative diplopia or visual changes, negative eye pain ENT: n++ sore throat Resp: e cough,  dyspnea Difficulty swallowing Cards: negative for chest pain, palpitations, lower extremity edema GU: negative for frequency, dysuria and hematuria GI: abdominal distension Skin: negative for rash and pruritus Heme: negative for easy bruising and gum/nose bleeding MS: weakness Neurolo:dizziness, fatigue , fall Psych: negative for feelings of anxiety, depression  Endocrine: negative for thyroid, diabetes Allergy/Immunology-as  above ?  Objective:  VITALS:  BP (!) 112/53   Pulse (!) 51   Temp 97.8 F (36.6 C) (Oral)   Resp 13   Ht 5' 9 (1.753 m)   Wt 106.6 kg   SpO2 98%   BMI 34.71 kg/m   PHYSICAL EXAM:  General: Alert, cooperative, no distress, chronically ill pale  Head: Normocephalic, without obvious abnormality, atraumatic. Eyes: Conjunctivae clear, anicteric sclerae. Pupils are equal ENT Nares normal. No drainage or sinus tenderness. Oropharyngeal severe white plaques Neck: left internal jugular triple lumen HD cath  Lungs:b/la ir entry  Decreased bases Heart: s1s2 Abdomen: Soft,  distended. Bowel sounds normal. No masses Extremities: edema Skin:limited examination Lymph: Cervical, supraclavicular normal. Neurologic: Grossly non-focal Pertinent Labs Lab Results CBC    Component Value Date/Time   WBC 25.1 (H) 06/05/2024 0520   RBC 2.44 (L) 06/05/2024 0520   HGB 7.9 (L) 06/05/2024 1230   HGB 13.3 10/12/2021 1314   HCT 25.4 (L) 06/05/2024 1230   HCT 39.4 10/12/2021 1314   PLT 89 (L) 06/05/2024 0520   PLT 175 10/12/2021 1314   MCV 102.5 (H) 06/05/2024 0520   MCV 97 10/12/2021 1314   MCH 32.4 06/05/2024 0520   MCHC 31.6 06/05/2024 0520   RDW 28.5 (H) 06/05/2024 0520    RDW 13.1 10/12/2021 1314   LYMPHSABS 1.8 05/29/2024 2040   LYMPHSABS 1.9 10/12/2021 1314   MONOABS 2.2 (H) 05/29/2024 2040   EOSABS 0.2 05/29/2024 2040   EOSABS 0.7 (H) 10/12/2021 1314   BASOSABS 0.2 (H) 05/29/2024 2040   BASOSABS 0.1 10/12/2021 1314       Latest Ref Rng & Units 06/05/2024    4:02 PM 06/05/2024    5:20 AM 06/04/2024    4:21 PM  CMP  Glucose 70 - 99 mg/dL 852  869  842   BUN 8 - 23 mg/dL 41  37  33   Creatinine 0.61 - 1.24 mg/dL 7.89  8.06  8.26   Sodium 135 - 145 mmol/L 135  135  133   Potassium 3.5 - 5.1 mmol/L 4.1  4.0  4.0   Chloride 98 - 111 mmol/L 101  100  101   CO2 22 - 32 mmol/L 23  24  23    Calcium  8.9 - 10.3 mg/dL 8.0  8.3  8.1       Microbiology: Recent Results (from the past 240 hours)  Resp panel by RT-PCR (RSV, Flu A&B, Covid) Anterior Nasal Swab     Status: None   Collection Time: 05/29/24 12:56 PM   Specimen: Anterior Nasal Swab  Result Value Ref Range Status   SARS Coronavirus 2 by RT PCR NEGATIVE NEGATIVE Final    Comment: (NOTE) SARS-CoV-2 target nucleic acids are NOT DETECTED.  The SARS-CoV-2 RNA is generally detectable in upper respiratory specimens during the acute phase of infection. The lowest concentration of SARS-CoV-2 viral copies this assay can detect is 138 copies/mL. A negative result does not preclude SARS-Cov-2 infection and should not be used as the sole basis for treatment or other patient management decisions. A negative result may occur with  improper specimen collection/handling, submission of specimen other than nasopharyngeal swab, presence of viral mutation(s) within the areas targeted by this assay, and inadequate number of viral copies(<138 copies/mL). A negative result must be combined with clinical observations, patient history, and epidemiological information. The expected result is Negative.  Fact Sheet for Patients:  BloggerCourse.com  Fact Sheet for Healthcare Providers:   SeriousBroker.it  This  test is no t yet approved or cleared by the [place]  FDA and  has been authorized for detection and/or diagnosis of SARS-CoV-2 by FDA under an Emergency Use Authorization (EUA). This EUA will remain  in effect (meaning this test can be used) for the duration of the COVID-19 declaration under Section 564(b)(1) of the Act, 21 U.S.C.section 360bbb-3(b)(1), unless the authorization is terminated  or revoked sooner.       Influenza A by PCR NEGATIVE NEGATIVE Final   Influenza B by PCR NEGATIVE NEGATIVE Final    Comment: (NOTE) The Xpert Xpress SARS-CoV-2/FLU/RSV plus assay is intended as an aid in the diagnosis of influenza from Nasopharyngeal swab specimens and should not be used as a sole basis for treatment. Nasal washings and aspirates are unacceptable for Xpert Xpress SARS-CoV-2/FLU/RSV testing.  Fact Sheet for Patients: BloggerCourse.com  Fact Sheet for Healthcare Providers: SeriousBroker.it  This test is not yet approved or cleared by the [place]  FDA and has been authorized for detection and/or diagnosis of SARS-CoV-2 by FDA under an Emergency Use Authorization (EUA). This EUA will remain in effect (meaning this test can be used) for the duration of the COVID-19 declaration under Section 564(b)(1) of the Act, 21 U.S.C. section 360bbb-3(b)(1), unless the authorization is terminated or revoked.     Resp Syncytial Virus by PCR NEGATIVE NEGATIVE Final    Comment: (NOTE) Fact Sheet for Patients: BloggerCourse.com  Fact Sheet for Healthcare Providers: SeriousBroker.it  This test is not yet approved or cleared by the [place]  FDA and has been authorized for detection and/or diagnosis of SARS-CoV-2 by FDA under an Emergency Use Authorization (EUA). This EUA will remain in effect (meaning this test can be used) for  the duration of the COVID-19 declaration under Section 564(b)(1) of the Act, 21 U.S.C. section 360bbb-3(b)(1), unless the authorization is terminated or revoked.  Performed at [facility], [address] [place], [place] [address]   Blood culture (routine x 2)     Status: None   Collection Time: 05/29/24 12:56 PM   Specimen: BLOOD  Result Value Ref Range Status   Specimen Description BLOOD RIGHT ANTECUBITAL  Final   Special Requests   Final    BOTTLES DRAWN AEROBIC AND ANAEROBIC Blood Culture results may not be optimal due to an inadequate volume of blood received in culture bottles   Culture   Final    NO GROWTH 5 DAYS Performed at [facility], [address] [place], [place] [address]    Report Status 06/03/2024 FINAL  Final  Blood culture (routine x 2)     Status: None   Collection Time: 05/29/24 12:56 PM   Specimen: BLOOD  Result Value Ref Range Status   Specimen Description BLOOD LEFT ANTECUBITAL  Final   Special Requests   Final    BOTTLES DRAWN AEROBIC AND ANAEROBIC Blood Culture results may not be optimal due to an inadequate volume of blood received in culture bottles   Culture   Final    NO GROWTH 5 DAYS Performed at [facility], [address], [place], [place] [address]    Report Status 06/03/2024 FINAL  Final  MRSA Next Gen by PCR, Nasal     Status: None   Collection Time: 05/29/24  2:58 PM   Specimen: Nasal Mucosa; Nasal Swab  Result Value Ref Range Status   MRSA by PCR Next Gen NOT DETECTED NOT DETECTED Final    Comment: (NOTE) The GeneXpert MRSA Assay (FDA approved for  NASAL specimens only), is one component of a comprehensive MRSA colonization surveillance program. It is not intended to diagnose MRSA infection nor to guide or monitor treatment for MRSA infections. Test performance is not FDA approved in patients less than 56 years old. Performed at [facility], [address] [place], [place] [address]    Group A Strep by PCR     Status: None   Collection Time: 05/29/24  2:58 PM   Specimen: Throat; Sterile Swab  Result Value Ref Range Status   Group A Strep by PCR NOT DETECTED NOT DETECTED Final    Comment: Performed at [facility], [address] [place], [place] [address]  Respiratory (~20 pathogens) panel by PCR     Status: None   Collection Time: 05/29/24  3:00 PM  Result Value Ref Range Status   Adenovirus NOT DETECTED NOT DETECTED Final   Coronavirus 229E NOT DETECTED NOT DETECTED Final    Comment: (NOTE) The Coronavirus on the Respiratory Panel, DOES NOT test for the novel  Coronavirus (2019 nCoV)    Coronavirus HKU1 NOT DETECTED NOT DETECTED Final   Coronavirus NL63 NOT DETECTED NOT DETECTED Final   Coronavirus OC43 NOT DETECTED NOT DETECTED Final   Metapneumovirus NOT DETECTED NOT DETECTED Final   Rhinovirus / Enterovirus NOT DETECTED NOT DETECTED Final   Influenza A NOT DETECTED NOT DETECTED Final   Influenza B NOT DETECTED NOT DETECTED Final   Parainfluenza Virus 1 NOT DETECTED NOT DETECTED Final   Parainfluenza Virus 2 NOT DETECTED NOT DETECTED Final   Parainfluenza Virus 3 NOT DETECTED NOT DETECTED Final   Parainfluenza Virus 4 NOT DETECTED NOT DETECTED Final   Respiratory Syncytial Virus NOT DETECTED NOT DETECTED Final   Bordetella pertussis NOT DETECTED NOT DETECTED Final   Bordetella Parapertussis NOT DETECTED NOT DETECTED Final   Chlamydophila pneumoniae NOT DETECTED NOT DETECTED Final   Mycoplasma pneumoniae NOT DETECTED NOT DETECTED Final    Comment: Performed at [facility], [address] [place], [place] [address]  MRSA Next Gen by PCR, Nasal     Status: None   Collection Time: 05/29/24  3:50 PM   Specimen: Nasal Mucosa; Nasal Swab  Result Value Ref Range Status   MRSA by PCR Next Gen NOT DETECTED NOT DETECTED Final    Comment: (NOTE) The GeneXpert MRSA Assay (FDA approved for NASAL specimens only), is one component of a comprehensive  MRSA colonization surveillance program. It is not intended to diagnose MRSA infection nor to guide or monitor treatment for MRSA infections. Test performance is not FDA approved in patients less than 66 years old. Performed at [facility], [address] [place], [place] [address]   KOH prep     Status: None   Collection Time: 06/05/24 10:00 AM   Specimen: Esophagus  Result Value Ref Range Status   Specimen Description ESOPHAGUS  Final   Special Requests Immunocompromised  Final   KOH Prep   Final    YEAST WITH PSEUDOHYPHAE RESULT CALLED TO, READ BACK BY AND VERIFIED WITH: NOTIFIED VERNELL HORSEMAN, RN [id number] 06/05/24 THAT TRICHOMONAS WAS ALSO SEEN ON SLIDE  GM Performed at [facility], [address], [place], [place] [address]    Report Status 06/05/2024 FINAL  Final      IMAGING RESULTS: CT abdomen and pelvis showed distended gallbladder with stones, liver cirrhosis. Advanced aortic atherosclerosis.  With aorta diameter of 4.1 cm Prostate seeds Moderate ascites in the abdomen and pelvis CT  chest had mild right pleural effusion with dependent atelectasis with some patchy foci of GGO in the upper lobes and right middle lobe  I have personally reviewed the films ? 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  This is a very unusual condition.  Have never seen this before There are some case reports of it in Severe immune compromised condition.  HIV was negative in  July 2025 will repeat   Severe esophageal and oropharyngeal candidiasis Will start high-dose fluconazole  Normal doses 400 mg daily adjusted to his creatinine clearance But on CRRT him have to get 800 mg Will need for minimum of 14 to 21 days  End-stage renal disease on CRRT  Portal vein thrombosis  Melena  Anemia  Thrombocytopenia  This consult involve complex antimicrobial management and unusual/complex infectious pathology  _I have personally spent  -75--minutes involved in face-to-face and non-face-to-face activities for this patient on the day of the visit. Professional time spent includes the following activities: Preparing to see the patient (review of tests), Obtaining and/or reviewing separately obtained history (admission/discharge record), Performing a medically appropriate examination and/or evaluation , Ordering medications/tests/procedures, referring and communicating with other health care professionals, Documenting clinical information in the EMR, Independently interpreting results (not separately reported), Communicating results to the patient/f Counseling and educating the patient/fand Care coordination (not separately reported).    ________________________________________________ Discussed with GI, Intensivist and ID pharmacist and with lab tech Note:  This document was prepared using Dragon voice recognition software and may include unintentional dictation errors.

## 2024-06-05 NOTE — Progress Notes (Signed)
 1 unit FFP ordered to be transfused for possible EGD in the morning, FFP infusion complete with no signs or symptoms of transfusion reaction.

## 2024-06-05 NOTE — Anesthesia Preprocedure Evaluation (Addendum)
 Anesthesia Evaluation  Patient identified by MRN, date of birth, ID band Patient awake    Reviewed: Allergy & Precautions, NPO status , Patient's Chart, lab work & pertinent test results  Airway Mallampati: III  TM Distance: >3 FB Neck ROM: full    Dental  (+) Missing, Loose, Poor Dentition, Dental Advisory Given   Pulmonary sleep apnea , COPD, Patient abstained from smoking., former smoker   Pulmonary exam normal  + decreased breath sounds      Cardiovascular Exercise Tolerance: Poor hypertension, + CAD, + Peripheral Vascular Disease and +CHF  Normal cardiovascular exam Rhythm:Regular     Neuro/Psych negative neurological ROS  negative psych ROS   GI/Hepatic negative GI ROS,,,(+) Cirrhosis     substance abuse  alcohol use  Endo/Other  negative endocrine ROS    Renal/GU CRFRenal disease  negative genitourinary   Musculoskeletal   Abdominal   Peds negative pediatric ROS (+)  Hematology negative hematology ROS (+) Blood dyscrasia, anemia   Anesthesia Other Findings Past Medical History: No date: Alcoholic cirrhosis of liver (HCC) 2024: Anemia 2024: Cataract No date: CHF (congestive heart failure) (HCC) No date: Chronic gouty arthritis No date: CKD (chronic kidney disease), stage III (HCC) No date: Clotting disorder     Comment:  Bilateral legs-pt states plaque build up in both legs               requiring intervention No date: Complication of anesthesia     Comment:  pt states that years ago during colonoscopy at [facility] they               had to abort the procedure due to some cardiac issue               had to see cardiologist No date: COPD (chronic obstructive pulmonary disease) (HCC) No date: Hyperlipidemia No date: Hypertension No date: Hyperuricemia No date: Peripheral arterial disease No date: Pre-diabetes 2023: Prostate cancer (HCC) No date: Sleep apnea     Comment:  uses cpap  Past Surgical  History: 10/20/2021: ABDOMINAL AORTOGRAM W/LOWER EXTREMITY; N/A     Comment:  Procedure: ABDOMINAL AORTOGRAM W/LOWER EXTREMITY;                Surgeon: Darron Deatrice LABOR, MD;  Location: [facility];  Service: Cardiovascular;  Laterality: N/A; 08/08/2023: ABDOMINAL AORTOGRAM W/LOWER EXTREMITY; N/A     Comment:  Procedure: ABDOMINAL AORTOGRAM W/LOWER EXTREMITY;                Surgeon: Serene Gaile ORN, MD;  Location: [facility];  Service: Cardiovascular;  Laterality: N/A; 12/22/2021: ALLOGRAFT APPLICATION; Bilateral     Comment:  Procedure: ALLOGRAFT APPLICATION;  Surgeon: Serene Gaile ORN, MD;  Location: [facility] OR;  Service: Vascular;                Laterality: Bilateral; 11/26/2021: ANGIOPLASTY; Left     Comment:  Procedure: LEFT INFRARENAL LITHOTRIPSY;  Surgeon:               Serene Gaile ORN, MD;  Location: [facility] OR;  Service:               Vascular;  Laterality: Left; 12/13/2021: APPLICATION OF WOUND VAC; Bilateral  Comment:  Procedure: APPLICATION OF WOUND VAC;  Surgeon: Lanis Fonda BRAVO, MD;  Location: [facility] OR;  Service: Vascular;                Laterality: Bilateral; No date: CARDIAC CATHETERIZATION     Comment:  [facility] 04/04/2023: CATARACT EXTRACTION W/PHACO; Left     Comment:  Procedure: CATARACT EXTRACTION PHACO AND INTRAOCULAR               LENS PLACEMENT (IOC) LEFT 8.71 00:45.5;  Surgeon:               Jaye Fallow, MD;  Location: [facility];                Service: Ophthalmology;  Laterality: Left; 04/18/2023: CATARACT EXTRACTION W/PHACO; Right     Comment:  Procedure: CATARACT EXTRACTION PHACO AND INTRAOCULAR               LENS PLACEMENT (IOC) RIGHT 4.31 00:8.2;  Surgeon:               Jaye Fallow, MD;  Location: [facility];                Service: Ophthalmology;  Laterality: Right; No date: COLONOSCOPY 01/17/2018: COLONOSCOPY WITH PROPOFOL ; N/A     Comment:  Procedure:  COLONOSCOPY WITH PROPOFOL ;  Surgeon: [place],               Ladell POUR, MD;  Location: [facility] END[facility];  Service:               Gastroenterology;  Laterality: N/A; 09/12/2022: COLONOSCOPY WITH PROPOFOL ; N/A     Comment:  Procedure: COLONOSCOPY WITH PROPOFOL ;  Surgeon:               Maryruth Ole DASEN, MD;  Location: [facility];                Service: Endoscopy;  Laterality: N/A; 09/05/2023: COLONOSCOPY WITH PROPOFOL ; N/A     Comment:  Procedure: COLONOSCOPY WITH PROPOFOL ;  Surgeon:               Maryruth Ole DASEN, MD;  Location: [facility];                Service: Endoscopy;  Laterality: N/A; 05/21/2024: DIALYSIS/PERMA CATHETER INSERTION; Right     Comment:  Procedure: DIALYSIS/PERMA CATHETER INSERTION;  Surgeon:               Jama Cordella MATSU, MD;  Location: [facility];               Service: Cardiovascular;  Laterality: Right; 11/26/2021: ENDARTERECTOMY FEMORAL; Bilateral     Comment:  Procedure: BILATERAL FEMORAL ENDARTERECTOMY WITH VEIN               PATCH ANGIOPLASTY;  Surgeon: Serene Gaile ORN, MD;                Location: [facility];  Service: Vascular;  Laterality:               Bilateral; 09/05/2023: ENTEROSCOPY; N/A     Comment:  Procedure: ENTEROSCOPY;  Surgeon: Maryruth Ole DASEN,               MD;  Location: [facility];  Service: Endoscopy;                Laterality: N/A; 01/17/2018: ESOPHAGOGASTRODUODENOSCOPY (EGD) WITH  PROPOFOL ; N/A     Comment:  Procedure: ESOPHAGOGASTRODUODENOSCOPY (EGD) WITH               PROPOFOL ;  Surgeon: [place], Ladell POUR, MD;  Location:               [facility];  Service: Gastroenterology;  Laterality:               N/A; 09/12/2022: ESOPHAGOGASTRODUODENOSCOPY (EGD) WITH PROPOFOL ; N/A     Comment:  Procedure: ESOPHAGOGASTRODUODENOSCOPY (EGD) WITH               PROPOFOL ;  Surgeon: Maryruth Ole DASEN, MD;  Location:               [facility];  Service: Endoscopy;  Laterality: N/A; No date: EYE SURGERY 12/16/2021: GROIN  DEBRIDEMENT; Bilateral     Comment:  Procedure: IRRIGATION AND DEBRIDEMENT OF BILATERAL               GROINS;  Surgeon: Serene Gaile ORN, MD;  Location: [facility];              Service: Vascular;  Laterality: Bilateral; 12/13/2021: INCISION AND DRAINAGE OF WOUND; Bilateral     Comment:  Procedure: IRRIGATION AND DEBRIDEMENT OF BILATERAL GROIN              WOUNDS;  Surgeon: Lanis Fonda BRAVO, MD;  Location: [facility] OR;              Service: Vascular;  Laterality: Bilateral; 11/26/2021: INSERTION OF ILIAC STENT; Bilateral     Comment:  Procedure: BILATERAL ILIAC STENTING USING 12mmX80mm               INNOVA STENT ON LEFT ILIAC AND 10mmX59mm, 13mmX79mm VBX AND              55mmX7.5cm VIABAHN STENT ON RIGHT ILIAC;  Surgeon:               Serene Gaile ORN, MD;  Location: [facility];  Service:               Vascular;  Laterality: Bilateral; No date: INSERTION OF SEEDS IN PROSTATE No date: ORIF ANKLE FRACTURE; Right 08/08/2023: PERIPHERAL INTRAVASCULAR LITHOTRIPSY     Comment:  Procedure: PERIPHERAL INTRAVASCULAR LITHOTRIPSY;                Surgeon: Serene Gaile ORN, MD;  [facility];  Service: Cardiovascular;; 04/01/2021: PROSTATE BIOPSY; N/A     Comment:  Procedure: PROSTATE BIOPSY URONAV;  Surgeon: Kassie Ozell SAUNDERS, MD;  Location: [facility] ORS;  Service: Urology;                Laterality: N/A; 11/26/2021: VEIN HARVEST; Bilateral     Comment:  Procedure: VEIN HARVEST OF BILATERAL SPAHENOUS VEINS;                Surgeon: Serene Gaile ORN, MD;  Location: [facility];                Service: Vascular;  Laterality: Bilateral; 12/22/2021: WOUND DEBRIDEMENT; Bilateral     Comment:  Procedure: INCISION AND DEBRIDEMENT OF BILATERAL GROINS;              Surgeon: Serene Gaile ORN, MD;  Location: [facility];  Service: Vascular;  Laterality: Bilateral;  BMI    Body Mass Index: 34.71 kg/m      Reproductive/Obstetrics negative OB ROS                               Anesthesia Physical Anesthesia Plan  ASA: 3  Anesthesia Plan: General   Post-op Pain Management:    Induction: Intravenous  PONV Risk Score and Plan: Propofol  infusion and TIVA  Airway Management Planned: Natural Airway and Nasal Cannula  Additional Equipment:   Intra-op Plan:   Post-operative Plan:   Informed Consent: I have reviewed the patients History and Physical, chart, labs and discussed the procedure including the risks, benefits and alternatives for the proposed anesthesia with the patient or authorized representative who has indicated his/her understanding and acceptance.     Dental Advisory Given  Plan Discussed with: CRNA  Anesthesia Plan Comments:          Anesthesia Quick Evaluation

## 2024-06-05 NOTE — Progress Notes (Signed)
 [facility]  ROUNDING NOTE   Subjective:   Kevin Gross  is a 68 y.o.  male  with past medical conditions including hypertension, COPD, obstructive sleep apnea with CPAP, anemia, and recent acute kidney injury requiring hemodialysis. Patient presents to ED with decreased blood pressure and has been admitted to ICU for Septic shock (HCC) [A41.9, R65.21]  Patient seen and evaluated at bedside. Alert and oriented.   Update: Patient sitting up in bed in ICU Completed EGD this morning Remains on 2L [place] Pressors: Levo and Vaso, weaning as tolerated  CRRT with net UF -15ml/hr; net neg by 4600 cc last 24 hrs   Objective:  Vital signs in last 24 hours:  Temp:  [95.6 F (35.3 C)-98.7 F (37.1 C)] 97.6 F (36.4 C) (10/08 1200) Pulse Rate:  [52-71] 60 (10/08 1400) Resp:  [11-28] 11 (10/08 1400) BP: (83-167)/(45-128) 115/53 (10/08 1400) SpO2:  [83 %-100 %] 99 % (10/08 1400) Weight:  [106.6 kg] 106.6 kg (10/08 0416)  Weight change: -5.4 kg Filed Weights   06/02/24 0500 06/04/24 0332 06/05/24 0416  Weight: 115.6 kg 112 kg 106.6 kg    Intake/Output: I/O last 3 completed shifts: In: 2355.6 [P.O.:400; I.V.:800.6; Blood:466; Other:38; IV Piggyback:651] Out: 7194    Intake/Output this shift:  Total I/O In: 120.7 [I.V.:70.7; IV Piggyback:50] Out: 857   Physical Exam: General: NAD  Head: Normocephalic, atraumatic. Moist oral mucosal membranes  Eyes: Anicteric  Lungs:  Clear to auscultation, normal effort  Heart: Regular rate and rhythm  Abdomen:  Soft, nontender  Extremities: 2+ peripheral edema.  Neurologic: Awake, alert, conversant  Skin: Generalized ecchymosis  Access: Right IJ PermCath    Basic Metabolic Panel: Recent Labs  Lab 06/01/24 0457 06/01/24 1545 06/02/24 0505 06/02/24 1630 06/03/24 0500 06/03/24 1621 06/04/24 0517 06/04/24 1621 06/05/24 0520  NA 133*   < > 135   < > 135 135 135 133* 135  K 3.9   < > 3.9   < > 4.0 4.0 4.1 4.0 4.0  CL 98    < > 100   < > 102 100 102 101 100  CO2 25   < > 24   < > 25 23 25 23 24   GLUCOSE 134*   < > 129*   < > 151* 133* 122* 157* 130*  BUN 44*   < > 32*   < > 30* 31* 29* 33* 37*  CREATININE 3.49*   < > 2.37*   < > 1.80* 1.80* 1.60* 1.73* 1.93*  CALCIUM  8.0*   < > 8.2*   < > 8.2* 8.0* 8.2* 8.1* 8.3*  MG 2.3  --  2.5*  --  2.5*  --  2.7*  --  2.6*  PHOS 3.1   < > 2.8   < > 3.2 3.9 2.4* 3.3 3.3   < > = values in this interval not displayed.    Liver Function Tests: Recent Labs  Lab 05/30/24 0338 05/31/24 0459 05/31/24 1558 06/03/24 0500 06/03/24 1621 06/04/24 0517 06/04/24 1621 06/05/24 0520  AST 270* 250*  --   --   --   --   --   --   ALT 91* 94*  --   --   --   --   --   --   ALKPHOS 205* 206*  --   --   --   --   --   --   BILITOT 3.6* 5.4*  --   --   --   --   --   --  PROT 4.4* 4.6*  --   --   --   --   --   --   ALBUMIN  2.0* 2.1*   < > 2.9* 2.8* 3.1* 2.9* 2.9*   < > = values in this interval not displayed.   No results for input(s): LIPASE, AMYLASE in the last 168 hours. No results for input(s): AMMONIA in the last 168 hours.   CBC: Recent Labs  Lab 05/29/24 2040 05/30/24 0338 06/01/24 0457 06/01/24 1125 06/02/24 0505 06/02/24 1227 06/03/24 0500 06/03/24 0503 06/04/24 0517 06/04/24 1203 06/04/24 1857 06/04/24 2337 06/05/24 0520 06/05/24 1230  WBC 32.3*   < > 23.0*  --  23.0*  --  28.6*  --  21.8*  --   --   --  25.1*  --   NEUTROABS 27.5*  --   --   --   --   --   --   --   --   --   --   --   --   --   HGB 7.7*   < > 7.5*   < > 7.3*  7.4*   < > 8.1*   < > 7.4* 7.4* 7.8* 8.1* 7.9* 7.9*  HCT 24.4*   < > 22.5*   < > 22.4*  22.5*   < > 24.9*   < > 23.6* 23.4* 24.5* 25.0* 25.0* 25.4*  MCV 107.0*   < > 96.6  --  99.6  --  101.2*  --  103.1*  --   --   --  102.5*  --   PLT 180   < > 107*  --  98*  --  106*  --  81*  --   --   --  89*  --    < > = values in this interval not displayed.    Cardiac Enzymes: No results for input(s): CKTOTAL, CKMB,  CKMBINDEX, TROPONINI in the last 168 hours.  BNP: Invalid input(s): POCBNP  CBG: Recent Labs  Lab 05/29/24 1540  GLUCAP 113*     Microbiology: Results for orders placed or performed during the hospital encounter of 05/29/24  Resp panel by RT-PCR (RSV, Flu A&B, Covid) Anterior Nasal Swab     Status: None   Collection Time: 05/29/24 12:56 PM   Specimen: Anterior Nasal Swab  Result Value Ref Range Status   SARS Coronavirus 2 by RT PCR NEGATIVE NEGATIVE Final    Comment: (NOTE) SARS-CoV-2 target nucleic acids are NOT DETECTED.  The SARS-CoV-2 RNA is generally detectable in upper respiratory specimens during the acute phase of infection. The lowest concentration of SARS-CoV-2 viral copies this assay can detect is 138 copies/mL. A negative result does not preclude SARS-Cov-2 infection and should not be used as the sole basis for treatment or other patient management decisions. A negative result may occur with  improper specimen collection/handling, submission of specimen other than nasopharyngeal swab, presence of viral mutation(s) within the areas targeted by this assay, and inadequate number of viral copies(<138 copies/mL). A negative result must be combined with clinical observations, patient history, and epidemiological information. The expected result is Negative.  Fact Sheet for Patients:  BloggerCourse.com  Fact Sheet for Healthcare Providers:  SeriousBroker.it  This test is no t yet approved or cleared by the [place]  FDA and  has been authorized for detection and/or diagnosis of SARS-CoV-2 by FDA under an Emergency Use Authorization (EUA). This EUA will remain  in effect (meaning this test can be used) for the  duration of the COVID-19 declaration under Section 564(b)(1) of the Act, 21 U.S.C.section 360bbb-3(b)(1), unless the authorization is terminated  or revoked sooner.       Influenza A by PCR  NEGATIVE NEGATIVE Final   Influenza B by PCR NEGATIVE NEGATIVE Final    Comment: (NOTE) The Xpert Xpress SARS-CoV-2/FLU/RSV plus assay is intended as an aid in the diagnosis of influenza from Nasopharyngeal swab specimens and should not be used as a sole basis for treatment. Nasal washings and aspirates are unacceptable for Xpert Xpress SARS-CoV-2/FLU/RSV testing.  Fact Sheet for Patients: BloggerCourse.com  Fact Sheet for Healthcare Providers: SeriousBroker.it  This test is not yet approved or cleared by the [place]  FDA and has been authorized for detection and/or diagnosis of SARS-CoV-2 by FDA under an Emergency Use Authorization (EUA). This EUA will remain in effect (meaning this test can be used) for the duration of the COVID-19 declaration under Section 564(b)(1) of the Act, 21 U.S.C. section 360bbb-3(b)(1), unless the authorization is terminated or revoked.     Resp Syncytial Virus by PCR NEGATIVE NEGATIVE Final    Comment: (NOTE) Fact Sheet for Patients: BloggerCourse.com  Fact Sheet for Healthcare Providers: SeriousBroker.it  This test is not yet approved or cleared by the [place]  FDA and has been authorized for detection and/or diagnosis of SARS-CoV-2 by FDA under an Emergency Use Authorization (EUA). This EUA will remain in effect (meaning this test can be used) for the duration of the COVID-19 declaration under Section 564(b)(1) of the Act, 21 U.S.C. section 360bbb-3(b)(1), unless the authorization is terminated or revoked.  Performed at [facility], [address] [place], [place] [address]   Blood culture (routine x 2)     Status: None   Collection Time: 05/29/24 12:56 PM   Specimen: BLOOD  Result Value Ref Range Status   Specimen Description BLOOD RIGHT ANTECUBITAL  Final   Special Requests   Final    BOTTLES DRAWN AEROBIC AND  ANAEROBIC Blood Culture results may not be optimal due to an inadequate volume of blood received in culture bottles   Culture   Final    NO GROWTH 5 DAYS Performed at [facility], [address] [place], [place] [address]    Report Status 06/03/2024 FINAL  Final  Blood culture (routine x 2)     Status: None   Collection Time: 05/29/24 12:56 PM   Specimen: BLOOD  Result Value Ref Range Status   Specimen Description BLOOD LEFT ANTECUBITAL  Final   Special Requests   Final    BOTTLES DRAWN AEROBIC AND ANAEROBIC Blood Culture results may not be optimal due to an inadequate volume of blood received in culture bottles   Culture   Final    NO GROWTH 5 DAYS Performed at [facility], [address], [place], [place] [address]    Report Status 06/03/2024 FINAL  Final  MRSA Next Gen by PCR, Nasal     Status: None   Collection Time: 05/29/24  2:58 PM   Specimen: Nasal Mucosa; Nasal Swab  Result Value Ref Range Status   MRSA by PCR Next Gen NOT DETECTED NOT DETECTED Final    Comment: (NOTE) The GeneXpert MRSA Assay (FDA approved for NASAL specimens only), is one component of a comprehensive MRSA colonization surveillance program. It is not intended to diagnose MRSA infection nor to guide or monitor treatment for MRSA infections. Test performance is not FDA approved in patients less than 97 years old. Performed at [facility], [address] [place], [place] [address]   Group A Strep by PCR     Status: None   Collection Time: 05/29/24  2:58 PM   Specimen: Throat; Sterile Swab  Result Value Ref Range Status   Group A Strep by PCR NOT DETECTED NOT DETECTED Final    Comment: Performed at [facility], [address] [place], [place] [address]  Respiratory (~20 pathogens) panel by PCR     Status: None   Collection Time: 05/29/24  3:00 PM  Result Value Ref Range Status   Adenovirus NOT DETECTED NOT DETECTED Final   Coronavirus 229E NOT DETECTED NOT  DETECTED Final    Comment: (NOTE) The Coronavirus on the Respiratory Panel, DOES NOT test for the novel  Coronavirus (2019 nCoV)    Coronavirus HKU1 NOT DETECTED NOT DETECTED Final   Coronavirus NL63 NOT DETECTED NOT DETECTED Final   Coronavirus OC43 NOT DETECTED NOT DETECTED Final   Metapneumovirus NOT DETECTED NOT DETECTED Final   Rhinovirus / Enterovirus NOT DETECTED NOT DETECTED Final   Influenza A NOT DETECTED NOT DETECTED Final   Influenza B NOT DETECTED NOT DETECTED Final   Parainfluenza Virus 1 NOT DETECTED NOT DETECTED Final   Parainfluenza Virus 2 NOT DETECTED NOT DETECTED Final   Parainfluenza Virus 3 NOT DETECTED NOT DETECTED Final   Parainfluenza Virus 4 NOT DETECTED NOT DETECTED Final   Respiratory Syncytial Virus NOT DETECTED NOT DETECTED Final   Bordetella pertussis NOT DETECTED NOT DETECTED Final   Bordetella Parapertussis NOT DETECTED NOT DETECTED Final   Chlamydophila pneumoniae NOT DETECTED NOT DETECTED Final   Mycoplasma pneumoniae NOT DETECTED NOT DETECTED Final    Comment: Performed at [facility], [address] [place], [place] [address]  MRSA Next Gen by PCR, Nasal     Status: None   Collection Time: 05/29/24  3:50 PM   Specimen: Nasal Mucosa; Nasal Swab  Result Value Ref Range Status   MRSA by PCR Next Gen NOT DETECTED NOT DETECTED Final    Comment: (NOTE) The GeneXpert MRSA Assay (FDA approved for NASAL specimens only), is one component of a comprehensive MRSA colonization surveillance program. It is not intended to diagnose MRSA infection nor to guide or monitor treatment for MRSA infections. Test performance is not FDA approved in patients less than 2 years old. Performed at [facility], [address] [place], [place] [address]   KOH prep     Status: None   Collection Time: 06/05/24 10:00 AM   Specimen: Esophagus  Result Value Ref Range Status   Specimen Description ESOPHAGUS  Final   Special Requests Immunocompromised   Final   KOH Prep   Final    YEAST WITH PSEUDOHYPHAE RESULT CALLED TO, READ BACK BY AND VERIFIED WITH: NOTIFIED VERNELL HORSEMAN, RN [id number] 06/05/24 THAT TRICHOMONAS WAS ALSO SEEN ON SLIDE  GM Performed at [facility], [address], [place], [place] [address]    Report Status 06/05/2024 FINAL  Final    Coagulation Studies: Recent Labs    06/03/24 0500 06/04/24 0517 06/05/24 0520  LABPROT 23.9* 21.0* 20.5*  INR 2.0* 1.7* 1.7*     Urinalysis: No results for input(s): COLORURINE, LABSPEC, PHURINE, GLUCOSEU, HGBUR, BILIRUBINUR, KETONESUR, PROTEINUR, UROBILINOGEN, NITRITE, LEUKOCYTESUR in the last 72 hours.  Invalid input(s): APPERANCEUR     Imaging: No results found.      Medications:    norepinephrine  (LEVOPHED ) Adult infusion 3 mcg/min (06/05/24 1400)   piperacillin -tazobactam Stopped (  06/05/24 1237)   prismasol BGK 4/2.5 100 mL/hr at 06/05/24 1059   prismasol BGK 4/2.5 100 mL/hr at 06/05/24 1057   prismasol BGK 4/2.5 1,000 mL/hr at 06/05/24 1057   vasopressin  0.04 Units/min (06/05/24 1400)     vitamin C   500 mg Oral BID   Chlorhexidine  Gluconate Cloth  6 each Topical Daily   epoetin  alfa-epbx (RETACRIT ) injection  10,000 Units Subcutaneous Q T,Th,Sat-1800   feeding supplement (NEPRO CARB STEADY)  237 mL Oral TID BM   hydrocortisone sod succinate (SOLU-CORTEF) inj  50 mg Intravenous Q6H   magic mouthwash  5 mL Oral QID   midodrine   15 mg Oral TID WC   multivitamin  1 tablet Oral QHS   nystatin  5 mL Oral QID   pantoprazole  (PROTONIX ) IV  40 mg Intravenous Q12H   thiamine   100 mg Oral Daily   alteplase , alteplase , docusate sodium , heparin , heparin , heparin , mouth rinse, polyethylene glycol, zolpidem   Assessment/ Plan:  [name] is a 68 y.o.  male  with past medical conditions including hypertension, COPD, obstructive sleep apnea with CPAP, anemia, and chronic kidney disease stage IV, who was admitted to [facility] on 05/29/2024  for Septic shock (HCC) [A41.9, R65.21]  [facility] [place] [facility]/TTS/RT permcath  Acute kidney injury on chronic kidney disease stage IV with Volume overload Baseline creatinine 2.77 with GFR 24 06/30/2024. CT abd/pelvis shows dense lesion on left kidney. Remains on CRRT, tolerating well.  Due to CVP and patient's status, will decrease net to -75 mL/h.  May consider stopping CRRT tomorrow, will assess in AM.     Edema greatly improved.  Will have to transition to hemodialysis and monitor patient tolerance.  Lab Results  Component Value Date   CREATININE 1.93 (H) 06/05/2024   CREATININE 1.73 (H) 06/04/2024   CREATININE 1.60 (H) 06/04/2024    Intake/Output Summary (Last 24 hours) at 06/05/2024 1425 Last data filed at 06/05/2024 1400 Gross per 24 hour  Intake 1226.57 ml  Output 4002 ml  Net -2775.43 ml   2. Hypotension likely due to shock secondary to liver cirrhosis.  Patient remains on norepinephrine  and vasopressin , managed by primary team.  Attempting to wean pressors as tolerated.  3. Anemia of chronic kidney disease Lab Results  Component Value Date   HGB 7.9 (L) 06/05/2024  Patient has received multiple blood products during this admission.  Continue Retacrit  10000 units  subcu injections T/T/S.  4.  Secondary Hyperparathyroidism: with outpatient labs: PTH 52.7, phosphorus 4.1, calcium  8.6 on 7/2.  Lab Results  Component Value Date   CALCIUM  8.3 (L) 06/05/2024   CAION 1.21 08/08/2023   PHOS 3.3 06/05/2024   Calcium  and phosphorus stable.  LOS: 7 [place] 10/8/20252:25 PM

## 2024-06-05 NOTE — Progress Notes (Addendum)
 Pt was taken to Endoscopy for his EGD. CRRT was stop at 0900 during his procedure. Pt returned to his room at 1045. CCRT resume at 1100. Per endoscopy RN pt received approximately 300 mL of IVF during his procedure.   RN notified Shantelle NP pt CVP was 3 at 1130 am. Per NP, CRRT - Net Hourly Fluid Balance Goal changed to (-) 75 ml/hr

## 2024-06-05 NOTE — Progress Notes (Signed)
 PHARMACY CONSULT NOTE - ELECTROLYTES  Pharmacy Consult for Electrolyte Monitoring and Replacement   Recent Labs: Height: 5' 9 (175.3 cm) Weight: 106.6 kg (235 lb 0.2 oz) IBW/kg (Calculated) : 70.7 Estimated Creatinine Clearance: 41.6 mL/min (A) (by C-G formula based on SCr of 2.1 mg/dL (H)). Potassium (mmol/L)  Date Value  06/05/2024 4.1   Magnesium  (mg/dL)  Date Value  89/91/7974 2.6 (H)   Calcium  (mg/dL)  Date Value  89/91/7974 8.0 (L)   Albumin  (g/dL)  Date Value  89/91/7974 2.7 (L)   Phosphorus (mg/dL)  Date Value  89/91/7974 4.0   Sodium (mmol/L)  Date Value  06/05/2024 135  10/12/2021 145 (H)    Assessment  Kevin Gross is a 68 y.o. male presenting with septic shock. PMH significant for Portal vein thrombosis 9/17, CHF, ESRD on HD TTS, ETOH disorder, chronic bradycardia. Pharmacy has been consulted to monitor and replace electrolytes.  Goal of Therapy: Electrolytes WNL  Plan:  No replacement warranted at this time.  Check Renal Panel daily at 0500 and 1600 while on CRRT Pharmacy will continue to monitor replace per protocol   Thank you for allowing pharmacy to be a part of this patient's care.   [name], PharmD, BCPS Clinical Pharmacist 06/05/2024 4:55 PM

## 2024-06-06 DIAGNOSIS — I81 Portal vein thrombosis: Secondary | ICD-10-CM | POA: Diagnosis not present

## 2024-06-06 DIAGNOSIS — F1011 Alcohol abuse, in remission: Secondary | ICD-10-CM | POA: Diagnosis not present

## 2024-06-06 DIAGNOSIS — K7031 Alcoholic cirrhosis of liver with ascites: Secondary | ICD-10-CM | POA: Diagnosis not present

## 2024-06-06 DIAGNOSIS — R570 Cardiogenic shock: Secondary | ICD-10-CM | POA: Diagnosis not present

## 2024-06-06 LAB — RENAL FUNCTION PANEL
Albumin: 3 g/dL — ABNORMAL LOW (ref 3.5–5.0)
Albumin: 2.7 g/dL — ABNORMAL LOW (ref 3.5–5.0)
Anion gap: 10 (ref 5–15)
Anion gap: 9 (ref 5–15)
BUN: 39 mg/dL — ABNORMAL HIGH (ref 8–23)
BUN: 41 mg/dL — ABNORMAL HIGH (ref 8–23)
CO2: 23 mmol/L (ref 22–32)
CO2: 24 mmol/L (ref 22–32)
Calcium: 8 mg/dL — ABNORMAL LOW (ref 8.9–10.3)
Calcium: 8.2 mg/dL — ABNORMAL LOW (ref 8.9–10.3)
Chloride: 99 mmol/L (ref 98–111)
Chloride: 101 mmol/L (ref 98–111)
Creatinine, Ser: 1.84 mg/dL — ABNORMAL HIGH (ref 0.61–1.24)
Creatinine, Ser: 1.97 mg/dL — ABNORMAL HIGH (ref 0.61–1.24)
GFR, Estimated: 40 mL/min — ABNORMAL LOW (ref >60)
GFR, Estimated: 37 mL/min — ABNORMAL LOW (ref >60)
Glucose, Bld: 124 mg/dL — ABNORMAL HIGH (ref 70–99)
Glucose, Bld: 147 mg/dL — ABNORMAL HIGH (ref 70–99)
Phosphorus: 3.7 mg/dL (ref 2.5–4.6)
Phosphorus: 3.6 mg/dL (ref 2.5–4.6)
Potassium: 4.3 mmol/L (ref 3.5–5.1)
Potassium: 4.3 mmol/L (ref 3.5–5.1)
Sodium: 132 mmol/L — ABNORMAL LOW (ref 135–145)
Sodium: 134 mmol/L — ABNORMAL LOW (ref 135–145)

## 2024-06-06 LAB — CBC
HCT: 24.3 % — ABNORMAL LOW (ref 39.0–52.0)
Hemoglobin: 7.7 g/dL — ABNORMAL LOW (ref 13.0–17.0)
MCH: 32.6 pg (ref 26.0–34.0)
MCHC: 31.7 g/dL (ref 30.0–36.0)
MCV: 103 fL — ABNORMAL HIGH (ref 80.0–100.0)
Platelets: 79 K/uL — ABNORMAL LOW (ref 150–400)
RBC: 2.36 MIL/uL — ABNORMAL LOW (ref 4.22–5.81)
RDW: 28.4 % — ABNORMAL HIGH (ref 11.5–15.5)
WBC: 20 K/uL — ABNORMAL HIGH (ref 4.0–10.5)
nRBC: 0.1 % (ref 0.0–0.2)

## 2024-06-06 LAB — HEMOGLOBIN AND HEMATOCRIT, BLOOD
HCT: 23.7 % — ABNORMAL LOW (ref 39.0–52.0)
HCT: 24.8 % — ABNORMAL LOW (ref 39.0–52.0)
HCT: 26.7 % — ABNORMAL LOW (ref 39.0–52.0)
Hemoglobin: 7.5 g/dL — ABNORMAL LOW (ref 13.0–17.0)
Hemoglobin: 7.6 g/dL — ABNORMAL LOW (ref 13.0–17.0)
Hemoglobin: 8.4 g/dL — ABNORMAL LOW (ref 13.0–17.0)

## 2024-06-06 LAB — MAGNESIUM: Magnesium: 2.8 mg/dL — ABNORMAL HIGH (ref 1.7–2.4)

## 2024-06-06 MED ORDER — FLUCONAZOLE 100 MG PO TABS
200.0000 mg | ORAL_TABLET | Freq: Every day | ORAL | Status: DC
Start: 2024-06-07 — End: 2024-06-26
  Administered 2024-06-07 – 2024-06-12 (×6): 200 mg via ORAL
  Filled 2024-06-06 (×6): qty 2

## 2024-06-06 MED ORDER — FLUCONAZOLE IN SODIUM CHLORIDE 400-0.9 MG/200ML-% IV SOLN
400.0000 mg | Freq: Once | INTRAVENOUS | Status: AC
  Administered 2024-06-06: 400 mg via INTRAVENOUS
  Filled 2024-06-06: qty 200

## 2024-06-06 MED ORDER — MAGIC MOUTHWASH W/LIDOCAINE
10.0000 mL | Freq: Three times a day (TID) | ORAL | Status: DC
  Administered 2024-06-06 – 2024-06-12 (×21): 10 mL via ORAL
  Filled 2024-06-06 (×23): qty 10

## 2024-06-06 MED ORDER — ENSURE MAX PROTEIN PO LIQD
11.0000 [oz_av] | Freq: Two times a day (BID) | ORAL | Status: DC
  Administered 2024-06-06 – 2024-06-12 (×10): 11 [oz_av] via ORAL

## 2024-06-06 MED ORDER — MIDODRINE HCL 5 MG PO TABS
20.0000 mg | ORAL_TABLET | Freq: Three times a day (TID) | ORAL | Status: DC
  Administered 2024-06-06 – 2024-06-12 (×18): 20 mg via ORAL
  Filled 2024-06-06 (×18): qty 4

## 2024-06-06 NOTE — Progress Notes (Signed)
 [facility]  ROUNDING NOTE   Subjective:   Kevin Gross  is a 68 y.o.  male  with past medical conditions including hypertension, COPD, obstructive sleep apnea with CPAP, anemia, and recent acute kidney injury requiring hemodialysis. Patient presents to ED with decreased blood pressure and has been admitted to ICU for Septic shock (HCC) [A41.9, R65.21]  Patient seen and evaluated at bedside. Alert and oriented.   Update: Patient sitting up in bed in ICU Remains on 2L  Pressors: Levo and Vaso, weaning as tolerated  CRRT with net UF -64ml/hr; net neg by 3000 cc last 24 hrs   Objective:  Vital signs in last 24 hours:  Temp:  [97.5 F (36.4 C)-97.8 F (36.6 C)] 97.6 F (36.4 C) (10/09 0658) Pulse Rate:  [45-68] 61 (10/09 1015) Resp:  [10-31] 17 (10/09 1015) BP: (82-142)/(45-111) 82/65 (10/09 1015) SpO2:  [79 %-100 %] 100 % (10/09 1015) Weight:  [106.9 kg] 106.9 kg (10/09 0500)  Weight change: 0.3 kg Filed Weights   06/04/24 0332 06/05/24 0416 06/06/24 0500  Weight: 112 kg 106.6 kg 106.9 kg    Intake/Output: I/O last 3 completed shifts: In: 1670.2 [P.O.:40; I.V.:586.1; Blood:274; Other:21; IV Piggyback:749.1] Out: 5280    Intake/Output this shift:  Total I/O In: 250.9 [P.O.:200; I.V.:39.2; IV Piggyback:11.7] Out: 344   Physical Exam: General: NAD  Head: Normocephalic, atraumatic. Moist oral mucosal membranes  Eyes: Anicteric  Lungs:  Clear to auscultation, normal effort  Heart: Regular rate and rhythm  Abdomen:  Soft, nontender  Extremities: 2+ peripheral edema.thigh and abd  Neurologic: Awake, alert, conversant  Skin: Generalized ecchymosis  Access: Right IJ PermCath    Basic Metabolic Panel: Recent Labs  Lab 06/02/24 0505 06/02/24 1630 06/03/24 0500 06/03/24 1621 06/04/24 0517 06/04/24 1621 06/05/24 0520 06/05/24 1602 06/06/24 0446  NA 135   < > 135   < > 135 133* 135 135 132*  K 3.9   < > 4.0   < > 4.1 4.0 4.0 4.1 4.3  CL 100    < > 102   < > 102 101 100 101 99  CO2 24   < > 25   < > 25 23 24 23 23   GLUCOSE 129*   < > 151*   < > 122* 157* 130* 147* 124*  BUN 32*   < > 30*   < > 29* 33* 37* 41* 39*  CREATININE 2.37*   < > 1.80*   < > 1.60* 1.73* 1.93* 2.10* 1.84*  CALCIUM  8.2*   < > 8.2*   < > 8.2* 8.1* 8.3* 8.0* 8.0*  MG 2.5*  --  2.5*  --  2.7*  --  2.6*  --  2.8*  PHOS 2.8   < > 3.2   < > 2.4* 3.3 3.3 4.0 3.7   < > = values in this interval not displayed.    Liver Function Tests: Recent Labs  Lab 05/31/24 0459 05/31/24 1558 06/04/24 0517 06/04/24 1621 06/05/24 0520 06/05/24 1602 06/06/24 0446  AST 250*  --   --   --   --   --   --   ALT 94*  --   --   --   --   --   --   ALKPHOS 206*  --   --   --   --   --   --   BILITOT 5.4*  --   --   --   --   --   --  PROT 4.6*  --   --   --   --   --   --   ALBUMIN  2.1*   < > 3.1* 2.9* 2.9* 2.7* 3.0*   < > = values in this interval not displayed.   No results for input(s): LIPASE, AMYLASE in the last 168 hours. No results for input(s): AMMONIA in the last 168 hours.   CBC: Recent Labs  Lab 06/02/24 0505 06/02/24 1227 06/03/24 0500 06/03/24 0503 06/04/24 0517 06/04/24 1203 06/05/24 0520 06/05/24 1230 06/05/24 1812 06/06/24 0006 06/06/24 0446  WBC 23.0*  --  28.6*  --  21.8*  --  25.1*  --   --   --  20.0*  HGB 7.3*  7.4*   < > 8.1*   < > 7.4*   < > 7.9* 7.9* 7.5* 7.5* 7.7*  HCT 22.4*  22.5*   < > 24.9*   < > 23.6*   < > 25.0* 25.4* 23.8* 23.7* 24.3*  MCV 99.6  --  101.2*  --  103.1*  --  102.5*  --   --   --  103.0*  PLT 98*  --  106*  --  81*  --  89*  --   --   --  79*   < > = values in this interval not displayed.    Cardiac Enzymes: No results for input(s): CKTOTAL, CKMB, CKMBINDEX, TROPONINI in the last 168 hours.  BNP: Invalid input(s): POCBNP  CBG: No results for input(s): GLUCAP in the last 168 hours.    Microbiology: Results for orders placed or performed during the hospital encounter of 05/29/24  Resp  panel by RT-PCR (RSV, Flu A&B, Covid) Anterior Nasal Swab     Status: None   Collection Time: 05/29/24 12:56 PM   Specimen: Anterior Nasal Swab  Result Value Ref Range Status   SARS Coronavirus 2 by RT PCR NEGATIVE NEGATIVE Final    Comment: (NOTE) SARS-CoV-2 target nucleic acids are NOT DETECTED.  The SARS-CoV-2 RNA is generally detectable in upper respiratory specimens during the acute phase of infection. The lowest concentration of SARS-CoV-2 viral copies this assay can detect is 138 copies/mL. A negative result does not preclude SARS-Cov-2 infection and should not be used as the sole basis for treatment or other patient management decisions. A negative result may occur with  improper specimen collection/handling, submission of specimen other than nasopharyngeal swab, presence of viral mutation(s) within the areas targeted by this assay, and inadequate number of viral copies(<138 copies/mL). A negative result must be combined with clinical observations, patient history, and epidemiological information. The expected result is Negative.  Fact Sheet for Patients:  BloggerCourse.com  Fact Sheet for Healthcare Providers:  SeriousBroker.it  This test is no t yet approved or cleared by the [place]  FDA and  has been authorized for detection and/or diagnosis of SARS-CoV-2 by FDA under an Emergency Use Authorization (EUA). This EUA will remain  in effect (meaning this test can be used) for the duration of the COVID-19 declaration under Section 564(b)(1) of the Act, 21 U.S.C.section 360bbb-3(b)(1), unless the authorization is terminated  or revoked sooner.       Influenza A by PCR NEGATIVE NEGATIVE Final   Influenza B by PCR NEGATIVE NEGATIVE Final    Comment: (NOTE) The Xpert Xpress SARS-CoV-2/FLU/RSV plus assay is intended as an aid in the diagnosis of influenza from Nasopharyngeal swab specimens and should not be used as a  sole basis for treatment. Nasal washings and aspirates  are unacceptable for Xpert Xpress SARS-CoV-2/FLU/RSV testing.  Fact Sheet for Patients: BloggerCourse.com  Fact Sheet for Healthcare Providers: SeriousBroker.it  This test is not yet approved or cleared by the [place]  FDA and has been authorized for detection and/or diagnosis of SARS-CoV-2 by FDA under an Emergency Use Authorization (EUA). This EUA will remain in effect (meaning this test can be used) for the duration of the COVID-19 declaration under Section 564(b)(1) of the Act, 21 U.S.C. section 360bbb-3(b)(1), unless the authorization is terminated or revoked.     Resp Syncytial Virus by PCR NEGATIVE NEGATIVE Final    Comment: (NOTE) Fact Sheet for Patients: BloggerCourse.com  Fact Sheet for Healthcare Providers: SeriousBroker.it  This test is not yet approved or cleared by the [place]  FDA and has been authorized for detection and/or diagnosis of SARS-CoV-2 by FDA under an Emergency Use Authorization (EUA). This EUA will remain in effect (meaning this test can be used) for the duration of the COVID-19 declaration under Section 564(b)(1) of the Act, 21 U.S.C. section 360bbb-3(b)(1), unless the authorization is terminated or revoked.  Performed at [facility], [address] [place], [place] [address]   Blood culture (routine x 2)     Status: None   Collection Time: 05/29/24 12:56 PM   Specimen: BLOOD  Result Value Ref Range Status   Specimen Description BLOOD RIGHT ANTECUBITAL  Final   Special Requests   Final    BOTTLES DRAWN AEROBIC AND ANAEROBIC Blood Culture results may not be optimal due to an inadequate volume of blood received in culture bottles   Culture   Final    NO GROWTH 5 DAYS Performed at [facility], [address] [place], [place] [address]    Report Status  06/03/2024 FINAL  Final  Blood culture (routine x 2)     Status: None   Collection Time: 05/29/24 12:56 PM   Specimen: BLOOD  Result Value Ref Range Status   Specimen Description BLOOD LEFT ANTECUBITAL  Final   Special Requests   Final    BOTTLES DRAWN AEROBIC AND ANAEROBIC Blood Culture results may not be optimal due to an inadequate volume of blood received in culture bottles   Culture   Final    NO GROWTH 5 DAYS Performed at [facility], [address], [place], [place] [address]    Report Status 06/03/2024 FINAL  Final  MRSA Next Gen by PCR, Nasal     Status: None   Collection Time: 05/29/24  2:58 PM   Specimen: Nasal Mucosa; Nasal Swab  Result Value Ref Range Status   MRSA by PCR Next Gen NOT DETECTED NOT DETECTED Final    Comment: (NOTE) The GeneXpert MRSA Assay (FDA approved for NASAL specimens only), is one component of a comprehensive MRSA colonization surveillance program. It is not intended to diagnose MRSA infection nor to guide or monitor treatment for MRSA infections. Test performance is not FDA approved in patients less than 33 years old. Performed at [facility], [address] [place], [place] [address]   Group A Strep by PCR     Status: None   Collection Time: 05/29/24  2:58 PM   Specimen: Throat; Sterile Swab  Result Value Ref Range Status   Group A Strep by PCR NOT DETECTED NOT DETECTED Final    Comment: Performed at [facility], [address] [place], [place] [address]  Respiratory (~20 pathogens) panel by PCR     Status: None   Collection  Time: 05/29/24  3:00 PM  Result Value Ref Range Status   Adenovirus NOT DETECTED NOT DETECTED Final   Coronavirus 229E NOT DETECTED NOT DETECTED Final    Comment: (NOTE) The Coronavirus on the Respiratory Panel, DOES NOT test for the novel  Coronavirus (2019 nCoV)    Coronavirus HKU1 NOT DETECTED NOT DETECTED Final   Coronavirus NL63 NOT DETECTED NOT DETECTED Final   Coronavirus  OC43 NOT DETECTED NOT DETECTED Final   Metapneumovirus NOT DETECTED NOT DETECTED Final   Rhinovirus / Enterovirus NOT DETECTED NOT DETECTED Final   Influenza A NOT DETECTED NOT DETECTED Final   Influenza B NOT DETECTED NOT DETECTED Final   Parainfluenza Virus 1 NOT DETECTED NOT DETECTED Final   Parainfluenza Virus 2 NOT DETECTED NOT DETECTED Final   Parainfluenza Virus 3 NOT DETECTED NOT DETECTED Final   Parainfluenza Virus 4 NOT DETECTED NOT DETECTED Final   Respiratory Syncytial Virus NOT DETECTED NOT DETECTED Final   Bordetella pertussis NOT DETECTED NOT DETECTED Final   Bordetella Parapertussis NOT DETECTED NOT DETECTED Final   Chlamydophila pneumoniae NOT DETECTED NOT DETECTED Final   Mycoplasma pneumoniae NOT DETECTED NOT DETECTED Final    Comment: Performed at [facility], [address], [place], [place] [address]  MRSA Next Gen by PCR, Nasal     Status: None   Collection Time: 05/29/24  3:50 PM   Specimen: Nasal Mucosa; Nasal Swab  Result Value Ref Range Status   MRSA by PCR Next Gen NOT DETECTED NOT DETECTED Final    Comment: (NOTE) The GeneXpert MRSA Assay (FDA approved for NASAL specimens only), is one component of a comprehensive MRSA colonization surveillance program. It is not intended to diagnose MRSA infection nor to guide or monitor treatment for MRSA infections. Test performance is not FDA approved in patients less than 66 years old. Performed at [facility], [address] [place], [place] [address]   KOH prep     Status: None   Collection Time: 06/05/24 10:00 AM   Specimen: Esophagus  Result Value Ref Range Status   Specimen Description ESOPHAGUS  Final   Special Requests Immunocompromised  Final   KOH Prep   Final    YEAST WITH PSEUDOHYPHAE RESULT CALLED TO, READ BACK BY AND VERIFIED WITH: NOTIFIED RACHEL VERDI, RN 1206 06/05/24 THAT TRICHOMONAS WAS ALSO SEEN ON SLIDE  GM Performed at [facility], [address]  [place], [place] [address]    Report Status 06/05/2024 FINAL  Final  Gastrointestinal Panel by PCR , Stool     Status: None   Collection Time: 06/05/24  3:04 PM   Specimen: Stool  Result Value Ref Range Status   Campylobacter species NOT DETECTED NOT DETECTED Final   Plesimonas shigelloides NOT DETECTED NOT DETECTED Final   Salmonella species NOT DETECTED NOT DETECTED Final   Yersinia enterocolitica NOT DETECTED NOT DETECTED Final   Vibrio species NOT DETECTED NOT DETECTED Final   Vibrio cholerae NOT DETECTED NOT DETECTED Final   Enteroaggregative E coli (EAEC) NOT DETECTED NOT DETECTED Final   Enteropathogenic E coli (EPEC) NOT DETECTED NOT DETECTED Final   Enterotoxigenic E coli (ETEC) NOT DETECTED NOT DETECTED Final   Shiga like toxin producing E coli (STEC) NOT DETECTED NOT DETECTED Final   Shigella/Enteroinvasive E coli (EIEC) NOT DETECTED NOT DETECTED Final   Cryptosporidium NOT DETECTED NOT DETECTED Final   Cyclospora cayetanensis NOT DETECTED NOT DETECTED Final   Entamoeba histolytica NOT DETECTED NOT DETECTED Final  Giardia lamblia NOT DETECTED NOT DETECTED Final   Adenovirus F40/41 NOT DETECTED NOT DETECTED Final   Astrovirus NOT DETECTED NOT DETECTED Final   Norovirus GI/GII NOT DETECTED NOT DETECTED Final   Rotavirus A NOT DETECTED NOT DETECTED Final   Sapovirus (I, II, IV, and V) NOT DETECTED NOT DETECTED Final    Comment: Performed at [facility], [address], [place], [place] [address]    Coagulation Studies: Recent Labs    06/04/24 0517 06/05/24 0520  LABPROT 21.0* 20.5*  INR 1.7* 1.7*     Urinalysis: No results for input(s): COLORURINE, LABSPEC, PHURINE, GLUCOSEU, HGBUR, BILIRUBINUR, KETONESUR, PROTEINUR, UROBILINOGEN, NITRITE, LEUKOCYTESUR in the last 72 hours.  Invalid input(s): APPERANCEUR     Imaging: No results found.      Medications:    fluconazole (DIFLUCAN) IV     And   fluconazole (DIFLUCAN) IV      metronidazole 500 mg (06/06/24 9047)   norepinephrine  (LEVOPHED ) Adult infusion 1 mcg/min (06/06/24 1000)   piperacillin -tazobactam Stopped (06/06/24 0619)   prismasol BGK 4/2.5 100 mL/hr at 06/05/24 1059   prismasol BGK 4/2.5 100 mL/hr at 06/05/24 1057   prismasol BGK 4/2.5 1,000 mL/hr at 06/06/24 0700   vasopressin  0.04 Units/min (06/06/24 1000)     vitamin C   500 mg Oral BID   Chlorhexidine  Gluconate Cloth  6 each Topical Daily   epoetin  alfa-epbx (RETACRIT ) injection  10,000 Units Subcutaneous Q T,Th,Sat-1800   feeding supplement (NEPRO CARB STEADY)  237 mL Oral TID BM   magic mouthwash w/lidocaine   10 mL Oral TID   midodrine   15 mg Oral TID WC   multivitamin  1 tablet Oral QHS   nystatin  5 mL Oral QID   pantoprazole  (PROTONIX ) IV  40 mg Intravenous Q12H   thiamine   100 mg Oral Daily   alteplase , alteplase , docusate sodium , heparin , heparin , heparin , mouth rinse, polyethylene glycol, zolpidem   Assessment/ Plan:  [name] is a 68 y.o.  male  with past medical conditions including hypertension, COPD, obstructive sleep apnea with CPAP, anemia, and chronic kidney disease stage IV, who was admitted to [facility] on 05/29/2024 for Septic shock (HCC) [A41.9, R65.21]  [facility]/TTS/RT permcath  Acute kidney injury on chronic kidney disease stage IV with Volume overload Baseline creatinine 2.77 with GFR 24 06/30/2024. CT abd/pelvis shows dense lesion on left kidney. Volume status has greatly improved. Will increase net UF to 14ml/hr for remaining treatment. Will stop CRRT prior to shift change today. Will attempt hemodialysis per outpatient schedule on Saturday.      Lab Results  Component Value Date   CREATININE 1.84 (H) 06/06/2024   CREATININE 2.10 (H) 06/05/2024   CREATININE 1.93 (H) 06/05/2024    Intake/Output Summary (Last 24 hours) at 06/06/2024 1050 Last data filed at 06/06/2024 1048 Gross per 24 hour  Intake 1157.03 ml  Output 3113 ml  Net -1955.97 ml   2.  Hypotension likely due to shock secondary to liver cirrhosis.  Patient remains on norepinephrine  and vasopressin , managed by primary team.  Attempting to wean pressors  3. Anemia of chronic kidney disease Lab Results  Component Value Date   HGB 7.7 (L) 06/06/2024  Patient has received multiple blood products during this admission.  Continue Retacrit  10000 units  subcu injections T/T/S.  4.  Secondary Hyperparathyroidism: with outpatient labs: PTH 52.7, phosphorus 4.1, calcium  8.6 on 7/2.  Lab Results  Component Value Date   CALCIUM  8.0 (L) 06/06/2024   CAION 1.21  08/08/2023   PHOS 3.7 06/06/2024  Will continue to monitor bone minerals.    LOS: 8 Etosha Wetherell 10/9/202510:50 AM

## 2024-06-06 NOTE — Plan of Care (Signed)
  Problem: Education: Goal: Knowledge of General Education information will improve Description: Including pain rating scale, medication(s)/side effects and non-pharmacologic comfort measures Outcome: Progressing   Problem: Health Behavior/Discharge Planning: Goal: Ability to manage health-related needs will improve Outcome: Progressing   Problem: Clinical Measurements: Goal: Ability to maintain clinical measurements within normal limits will improve Outcome: Progressing Goal: Will remain free from infection Outcome: Progressing Goal: Diagnostic test results will improve Outcome: Progressing Goal: Respiratory complications will improve Outcome: Progressing   Problem: Activity: Goal: Risk for activity intolerance will decrease Outcome: Progressing   Problem: Coping: Goal: Level of anxiety will decrease Outcome: Progressing   Problem: Elimination: Goal: Will not experience complications related to bowel motility Outcome: Progressing   Problem: Pain Managment: Goal: General experience of comfort will improve and/or be controlled Outcome: Progressing   Problem: Safety: Goal: Ability to remain free from injury will improve Outcome: Progressing
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
## 2024-06-06 NOTE — Progress Notes (Signed)
 GI note  EGD performed yesterday candidiasis of the esophagus along with trichomonas noted.  Discussed with ID.  No active bleeding seen.  My impression is that the bleeding was probably due to supratherapeutic INR.  Recommend monitoring hemoglobin if there is further bleeding we can consider a capsule study of the small bowel. Presently hemoglobin is stable   I will sign off.  Please call me if any further GI concerns or questions.  We would like to thank you for the opportunity to participate in the care of Kevin Gross.

## 2024-06-06 NOTE — Progress Notes (Incomplete)
 Date of Admission:  05/29/2024      ID: Kevin Gross is a 68 y.o. male Principal Problem:   Septic shock (HCC) Active Problems:   Melena   Gastrointestinal hemorrhage    Subjective: Pt stable States able to swallow better  Medications:   vitamin C   500 mg Oral BID   Chlorhexidine  Gluconate Cloth  6 each Topical Daily   epoetin  alfa-epbx (RETACRIT ) injection  10,000 Units Subcutaneous Q T,Th,Sat-1800   [START ON 06/07/2024] fluconazole  200 mg Oral QHS   magic mouthwash w/lidocaine   10 mL Oral TID   midodrine   20 mg Oral TID WC   multivitamin  1 tablet Oral QHS   nystatin  5 mL Oral QID   pantoprazole  (PROTONIX ) IV  40 mg Intravenous Q12H   Ensure Max Protein  11 oz Oral BID   thiamine   100 mg Oral Daily    Objective: Vital signs in last 24 hours: Patient Vitals for the past 24 hrs:  BP Temp Temp src Pulse Resp SpO2 Weight  06/06/24 1330 116/87 -- -- (!) 51 16 100 % --  06/06/24 1315 120/60 -- -- (!) 52 -- 100 % --  06/06/24 1300 126/61 -- -- (!) 51 -- 100 % --  06/06/24 1245 119/66 -- -- (!) 52 -- 100 % --  06/06/24 1230 (!) 112/54 -- -- (!) 54 -- 100 % --  06/06/24 1215 (!) 104/53 -- -- (!) 46 -- 100 % --  06/06/24 1200 (!) 98/49 -- -- (!) 46 -- 100 % --  06/06/24 1145 (!) 92/46 -- -- (!) 50 -- 100 % --  06/06/24 1138 (!) 95/52 -- -- (!) 52 -- 100 % --  06/06/24 1130 (!) 89/49 -- -- (!) 55 -- 100 % --  06/06/24 1115 (!) 109/98 -- -- 61 -- 100 % --  06/06/24 1100 (!) 130/53 -- -- (!) 49 -- 100 % --  06/06/24 1045 (!) 116/59 -- -- (!) 54 -- 100 % --  06/06/24 1030 (!) 98/56 -- -- 60 15 100 % --  06/06/24 1015 (!) 82/65 -- -- 61 17 100 % --  06/06/24 1000 105/61 -- -- (!) 54 14 100 % --  06/06/24 0945 (!) 95/46 -- -- (!) 51 15 100 % --  06/06/24 0930 (!) 108/53 -- -- (!) 51 18 100 % --  06/06/24 0915 (!) 103/56 -- -- 62 14 100 % --  06/06/24 0900 100/70 -- -- 68 17 100 % --  06/06/24 0845 (!) 102/59 -- -- 65 15 100 % --  06/06/24 0830 (!) 99/58 -- -- 66 16 97  % --  06/06/24 0815 (!) 103/59 -- -- 61 14 98 % --  06/06/24 0800 (!) 93/52 -- -- 65 15 98 % --  06/06/24 0715 (!) 115/57 -- -- 61 16 96 % --  06/06/24 0700 (!) 104/54 -- -- (!) 56 12 97 % --  06/06/24 0658 -- 97.6 F (36.4 C) -- -- -- -- --  06/06/24 0645 (!) 110/56 -- -- (!) 59 16 97 % --  06/06/24 0630 115/64 -- -- (!) 59 15 97 % --  06/06/24 0615 (!) 97/48 -- -- (!) 52 12 97 % --  06/06/24 0600 (!) 103/52 -- -- (!) 50 12 92 % --  06/06/24 0545 113/62 -- -- 63 16 (!) 87 % --  06/06/24 0530 (!) 113/45 -- -- 65 18 99 % --  06/06/24 0515 (!) 127/105 -- -- (!) 54  17 99 % --  06/06/24 0500 (!) 116/51 -- -- (!) 48 12 98 % 106.9 kg  06/06/24 0445 -- -- -- (!) 50 11 97 % --  06/06/24 0430 (!) 121/91 -- -- (!) 51 15 98 % --  06/06/24 0415 (!) 91/59 -- -- (!) 49 17 97 % --  06/06/24 0400 (!) 105/51 (!) 97.5 F (36.4 C) Oral (!) 48 18 97 % --  06/06/24 0345 101/64 -- -- (!) 54 20 98 % --  06/06/24 0330 (!) 106/49 -- -- (!) 51 15 98 % --  06/06/24 0315 -- -- -- (!) 54 13 96 % --  06/06/24 0300 (!) 125/111 -- -- 68 (!) 31 90 % --  06/06/24 0245 (!) 110/59 -- -- (!) 54 (!) 24 97 % --  06/06/24 0235 -- -- -- (!) 52 15 98 % --  06/06/24 0230 118/61 -- -- (!) 54 17 97 % --  06/06/24 0215 110/77 -- -- (!) 54 17 97 % --  06/06/24 0200 (!) 110/56 -- -- (!) 51 16 99 % --  06/06/24 0145 (!) 108/50 97.8 F (36.6 C) Axillary (!) 45 12 98 % --  06/06/24 0130 (!) 107/52 -- -- (!) 47 10 98 % --  06/06/24 0115 (!) 110/52 -- -- (!) 48 11 97 % --  06/06/24 0100 (!) 113/52 -- -- (!) 49 11 100 % --  06/06/24 0045 123/62 -- -- (!) 53 15 100 % --  06/06/24 0030 (!) 121/55 -- -- (!) 49 14 94 % --  06/06/24 0015 (!) 120/59 -- -- (!) 50 12 99 % --  06/06/24 0000 (!) 114/53 -- -- (!) 46 16 98 % --  06/05/24 2345 (!) 103/55 -- -- (!) 46 (!) 22 98 % --  06/05/24 2330 (!) 107/55 -- -- (!) 52 15 97 % --  06/05/24 2328 (!) 107/55 -- -- (!) 51 14 (!) 79 % --  06/05/24 2315 (!) 110/51 -- -- (!) 52 12 97 % --   06/05/24 2300 (!) 109/51 -- -- (!) 50 12 98 % --  06/05/24 2245 (!) 114/58 -- -- 61 20 96 % --  06/05/24 2230 (!) 116/59 -- -- (!) 52 19 99 % --  06/05/24 2215 (!) 109/52 -- -- (!) 49 13 99 % --  06/05/24 2200 (!) 101/48 -- -- (!) 50 13 94 % --  06/05/24 2145 (!) 103/53 -- -- (!) 50 15 (!) 83 % --  06/05/24 2130 (!) 116/59 -- -- (!) 52 15 98 % --  06/05/24 2115 (!) 142/61 -- -- (!) 57 16 97 % --  06/05/24 2100 (!) 109/49 (!) 97.5 F (36.4 C) Oral (!) 51 16 95 % --  06/05/24 2045 -- -- -- 63 (!) 21 97 % --  06/05/24 2030 (!) 104/47 -- -- (!) 49 14 98 % --  06/05/24 2015 -- -- -- (!) 50 14 (!) 82 % --  06/05/24 2000 (!) 125/53 -- -- (!) 49 13 98 % --  06/05/24 1900 (!) 110/50 -- -- (!) 51 15 100 % --  06/05/24 1830 (!) 126/59 -- -- (!) 52 15 97 % --  06/05/24 1800 (!) 117/57 -- -- (!) 51 17 96 % --  06/05/24 1730 (!) 118/55 -- -- (!) 52 14 97 % --  06/05/24 1700 (!) 112/53 -- -- (!) 51 13 98 % --  06/05/24 1630 (!) 110/52 -- -- (!) 53 14 96 % --  06/05/24 1600 (!)  112/50 97.8 F (36.6 C) Oral (!) 52 15 100 % --  06/05/24 1530 (!) 99/50 -- -- (!) 51 14 100 % --  06/05/24 1500 (!) 105/55 -- -- (!) 59 (!) 26 96 % --  06/05/24 1430 (!) 109/57 -- -- (!) 59 14 100 % --     Lines and Device Date on insertion # of days DC  Chiropractor     Foley     ETT       PHYSICAL EXAM:  General: awake, plae chronically ill.  Oral cavity thrush Neck: left internal jugular line Back: No CVA tenderness. Lungs: b/la ir entry. Heart: s1s2 Abdomen: Soft,  distended. Bowel sounds normal. No masses Extremities: edema  Lymph: Cervical, supraclavicular normal. Neurologic: Grossly non-focal  Lab Results    Latest Ref Rng & Units 06/06/2024   12:30 PM 06/06/2024    4:46 AM 06/06/2024   12:06 AM  CBC  WBC 4.0 - 10.5 K/uL  20.0    Hemoglobin 13.0 - 17.0 g/dL 7.6  7.7  7.5   Hematocrit 39.0 - 52.0 % 24.8  24.3  23.7   Platelets 150 - 400 K/uL  79         Latest Ref Rng & Units  06/06/2024    4:46 AM 06/05/2024    4:02 PM 06/05/2024    5:20 AM  CMP  Glucose 70 - 99 mg/dL 875  852  869   BUN 8 - 23 mg/dL 39  41  37   Creatinine 0.61 - 1.24 mg/dL 8.15  7.89  8.06   Sodium 135 - 145 mmol/L 132  135  135   Potassium 3.5 - 5.1 mmol/L 4.3  4.1  4.0   Chloride 98 - 111 mmol/L 99  101  100   CO2 22 - 32 mmol/L 23  23  24    Calcium  8.9 - 10.3 mg/dL 8.0  8.0  8.3         Assessment/Plan: 68 yr male with decompensated liver cirrhosis, ascites, portal vein thrombosis, ESRD on dialysis   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   Circulatory shock Initially thought to be septic shock Blood cultures have been negative Patient is currently on Zosyn   been on since admission Would recommend discontinuing it   Severe esophageal and oropharyngeal candidiasis On fluconazole adjusted to dialysis  whether CRRT or HD Normal doses 400 mg daily adjusted to his creatinine clearance But on CRRT him have to get 800 mg Will need for minimum of 14 to 21 days      Trichomonas in the esophageal brushing This is unlikely to be Trichomonas vaginalis  can be other commensal( non pathogenic) like Trichomonas tenax present in  the mouth with poor hygiene or Pentatrichomas hominis present in the intestines  On  Flagyl 500 mg twice daily IV.  This is a very unusual condition.  Have never seen this before There are some case reports of it in Severe immune compromised condition.  HIV neg  End-stage renal disease on CRRT   Portal vein thrombosis   Melena   Anemia   Thrombocytopenia  Discussed the management with the care team

## 2024-06-06 NOTE — Progress Notes (Signed)
 NAME:  Kevin Gross, MRN:  [id number], DOB:  Dec 22, 1955, LOS: 8 ADMISSION DATE:  05/29/2024, CONSULTATION DATE:  05/29/2024 REFERRING MD:  Dr. Claudene, CHIEF COMPLAINT:  Generalized Weakness & Hypotension    Brief Pt Description / Synopsis:  68 y.o. male with PMHx significant for ESRD on HD, HFpEF, COPD, OSA, Alcoholic Cirrhosis, and portal vein thrombosis, who is admitted with Multifactorial shock (hypovolemic/hemorrhagic vs septic), Sepsis of unknown etiology (questionable LLE cellulitis vs Pneumonia vs SBP), Acute GI Bleed, and Acute Decompensated Cirrhosis.   History of Present Illness:  Kevin Gross is a 68 y.o. male who presented to [facility] ED via EMS from his home to be evaluated for hypotension and weakness. He reports his initial outpatient hemodialysis appointment was 05/28/2024 at [facility]. This initial appointment is following a recent admission at [facility], of which he was discharged on 05/24/24.   His overall weakness began yesterday following his dialysis appointment and has continued today, with the addition of upper URI symptoms to include minor congestion and a sore throat as of this morning. Upon EMS arrival, he was found to have a BP of 64/28, to which he received fluids and BP increased to 94/42. He reported a sore throat and generalized fatigue/weakness that resulted in a fall this morning. He also stated at baseline he has no issue ambulating with a walker.  06/04/24-patient resting in bed comfortably. He remains hypotensive on vasopressors and dialysis.   06/05/24- patient went for EGD today. I discussed findings with GI Dr Therisa and there is trichomonal infection of pharynx and esophagus which may be seen with severe immunocompromised patients.  He is communicative.  He has oral mucosal sloughing and oral thrush as well.    06/06/24- patient on levophed  low dose, able to tolerate clear diet.  Has R internal jugular and subclavian central line. Severe thrush on EGD.  Trichomonas in  throat and is being treated with ID specialist.   Pertinent  Medical History  ETOH Hypertensin Hyperlipidemia Congestive Heart Failure End Stage Renal Disease  Micro Data:  05/29/24: Blood cultures x2 >> no growth to date  05/29/24: MRSA PCR >> negative 05/29/24: Group A Strep PCR >> negative 05/29/24: COVID/FLU/RSV PCR >> negative 05/29/24: Legionella Urine Antigen >> 05/29/24: Strep Pneumo Urine Antigen >> 05/30/24: Viral Respiratory Panel >> negative   Antimicrobials:   Anti-infectives (From admission, onward)    Start     Dose/Rate Route Frequency Ordered Stop   06/06/24 1600  fluconazole (DIFLUCAN) IVPB 400 mg       Placed in And Linked Group   400 mg 100 mL/hr over 120 Minutes Intravenous Every 24 hours 06/05/24 1617     06/06/24 1400  fluconazole (DIFLUCAN) IVPB 200 mg  Status:  Discontinued        200 mg 100 mL/hr over 60 Minutes Intravenous Every 24 hours 06/05/24 1558 06/05/24 1606   06/06/24 1400  fluconazole (DIFLUCAN) IVPB 400 mg       Placed in And Linked Group   400 mg 100 mL/hr over 120 Minutes Intravenous Every 24 hours 06/05/24 1617     06/05/24 2000  fluconazole (DIFLUCAN) IVPB 400 mg  Status:  Discontinued       Placed in And Linked Group   400 mg 100 mL/hr over 120 Minutes Intravenous Every 24 hours 06/05/24 1606 06/05/24 1617   06/05/24 1800  fluconazole (DIFLUCAN) IVPB 400 mg  Status:  Discontinued        400 mg 100 mL/hr over 120  Minutes Intravenous  Once 06/05/24 1558 06/05/24 1606   06/05/24 1800  fluconazole (DIFLUCAN) IVPB 400 mg  Status:  Discontinued       Placed in And Linked Group   400 mg 100 mL/hr over 120 Minutes Intravenous Every 24 hours 06/05/24 1606 06/05/24 1617   06/05/24 1800  fluconazole (DIFLUCAN) IVPB 400 mg        400 mg 100 mL/hr over 120 Minutes Intravenous  Once 06/05/24 1617 06/05/24 2009   06/05/24 1700  metroNIDAZOLE (FLAGYL) IVPB 500 mg        500 mg 100 mL/hr over 60 Minutes Intravenous Every 12 hours 06/05/24  1558     06/05/24 1430  fluconazole (DIFLUCAN) tablet 400 mg  Status:  Discontinued        400 mg Oral Daily 06/05/24 1343 06/05/24 1353   05/31/24 1800  piperacillin -tazobactam (ZOSYN ) IVPB 3.375 g  Status:  Discontinued        3.375 g 12.5 mL/hr over 240 Minutes Intravenous Every 6 hours 05/31/24 1300 05/31/24 1305   05/31/24 1400  piperacillin -tazobactam (ZOSYN ) IVPB 3.375 g        3.375 g 100 mL/hr over 30 Minutes Intravenous Every 6 hours 05/31/24 1306 06/07/24 1159   05/29/24 2200  piperacillin -tazobactam (ZOSYN ) IVPB 2.25 g  Status:  Discontinued        2.25 g 100 mL/hr over 30 Minutes Intravenous Every 8 hours 05/29/24 1752 05/31/24 1300   05/29/24 1400  piperacillin -tazobactam (ZOSYN ) IVPB 3.375 g        3.375 g 100 mL/hr over 30 Minutes Intravenous  Once 05/29/24 1353 05/29/24 1432   05/29/24 1400  vancomycin  (VANCOREADY) IVPB 2000 mg/400 mL        2,000 mg 200 mL/hr over 120 Minutes Intravenous  Once 05/29/24 1353 05/29/24 1600       Significant Hospital Events: Including procedures, antibiotic start and stop dates in addition to other pertinent events   05/29/24: Started on Levophed  in the ED following refractory hypotension. Midodrine  10mg  x 1. 1.5L fluids given without improvement. Transferred to the ICU for further management. 2L [place] started with improvement in O2.  05/30/24: Remains on levophed  and started on vaso overnight. Minimal oxygen requirements. Oliguric with plan for HD today at 11.  Abdominal Arterial Ultrasound with patent portal vein and hepatopetal flow (PORTAL VEIN THROMBOSIS RESOLVED), notes cirrhosis and moderate to large ascites.  05/31/24: No significant events overnight, nursing noted several small episodes of melena.  Hgb down to 6.9 this am, giving 1 unit pRBCs.  Remains on Levophed  and Vasopressin , start stress dose steroids and give Albumin .  Ultrasound yesterday negative for portal vein thrombosis, will give Vitamin K  for supratherapeutic INR.  Nephrology  starting CRRT.  10/4 shock and on pressors 10/5 remains on pressors and CRRT  Interim History / Subjective:  Remains critically ill liver cirrhosis and chronic kidney disease   Objective   Blood pressure (!) 95/46, pulse (!) 51, temperature 97.6 F (36.4 C), resp. rate 15, height 5' 9 (1.753 m), weight 106.9 kg, SpO2 100%. CVP:  [3 mmHg-8 mmHg] 6 mmHg      Intake/Output Summary (Last 24 hours) at 06/06/2024 1019 Last data filed at 06/06/2024 1000 Gross per 24 hour  Intake 957.03 ml  Output 2980 ml  Net -2022.97 ml   Filed Weights   06/04/24 0332 06/05/24 0416 06/06/24 0500  Weight: 112 kg 106.6 kg 106.9 kg     Physical Examination:   General Appearance: No distress  EYES  PERRLA, EOM intact.  Icteric sclera NECK Supple, No JVD Pulmonary: normal breath sounds, No wheezing.  CardiovascularNormal S1,S2.  No m/r/g.   Abdomen: Benign, Soft, non-tender. Neurology UE/LE 5/5 strength, no focal deficits Ext pulses intact, cap refill intact +edema ALL OTHER ROS ARE NEGATIVE     Assessment & Plan:  68 yo morbidly obese male admitted for decompensated liver cirrhosis and failure with progressive end stage kidney failure With severe shock hypovolemic/Hemorrhagic vs Septic exacerbated by Decompensated Liver Cirrhosis    Shock: HFpEF without acute exacerbation  Mildly Elevated Troponin due to demand ischemia  PMHx: Bradycardia, HTN, HLD, Paroxsymal A. Fib, prolonged QT -Echocardiogram 05/16/24: LVEF 60-65%, mild LVH, normal diastolic parameters, RV systolic function normal, RV size normal, normal pulmonary artery systolic pressure  -Continuous cardiac monitoring -Vasopressors as needed to maintain MAP goal -Volume removal with renal replacement therapy  Mild protein calorie malnutrition         - monitor for re-feeding syndrome post EGD            Sepsis of Unknown Etiology Questionable Cellulitis of LLE vs Pneumonia vs Spontaneous Bacterial Peritonitis  -Follow cultures  as above -Continue empiric Zosyn  pending cultures & sensitivities  ESRD on Hemodialysis  Anion Gap Metabolic Acidosis  -Monitor I&O's / urinary output -Follow BMP -Ensure adequate renal perfusion -Avoid nephrotoxic agents as able -CRRT as per Nephrology   Suspected Acute GI Bleed superimposed on Chronic Iron  Deficiency Anemia Thrombocytopenia, suspect due to cirrhosis  Supratherapeutic INR now 2.4 -SCD's for VTE Prophylaxis  -Transfuse for Hgb <7 ~ status post 1 unit of pRBCs and 1 FFP ~ plan for 1 unit pRBC on 10/3 -Transfuse Platelets for platelet count <10K; <50K with active bleeding; <100K for Neurosurgical procedures -Vitamin K  10/3  Alcoholic Liver Cirrhosis Acute Decompensated Liver Cirrhosis Transaminitis Hypoalbuminemia Portal Vein Thrombosis (Identified during previous admission) ~ RESOLVED  Hx: ETOH abuse -05/29/24 CT Chest/Abdomen/Pelvis: small bilateral pleural effusions, liver cirrhosis, distended gallbladder with small stones, exophytic lesions of left kidney, aortic atherosclerosis. -05/30/24 Abdominal Pelvic Arterial/Venous Flow Doppler: shows resolution of previous portal vein thrombus, also notes cirrhosis and moderate to large volume of ascites  -Continue Protonix  BID -Holding Eliquis  currently until active GI Bleed ruled out -GI following, appreciate input  No need for paracentesis at this time    OVERALL POOR PROGNOSIS PATIENT WITH MULTIORGAN FAILURE   Best Practice (right click and Reselect all SmartList Selections daily)   Diet/type: Regular consistency (see orders) DVT prophylaxis: SCD GI prophylaxis: PPI Lines: Central line, Dialysis Catheter, and yes and it is still needed Foley:  Yes, and it is still needed Code Status:  full code DAILY multidisciplinary goals of care      Labs   CBC: Recent Labs  Lab 06/02/24 0505 06/02/24 1227 06/03/24 0500 06/03/24 0503 06/04/24 0517 06/04/24 1203 06/05/24 0520 06/05/24 1230 06/05/24 1812  06/06/24 0006 06/06/24 0446  WBC 23.0*  --  28.6*  --  21.8*  --  25.1*  --   --   --  20.0*  HGB 7.3*  7.4*   < > 8.1*   < > 7.4*   < > 7.9* 7.9* 7.5* 7.5* 7.7*  HCT 22.4*  22.5*   < > 24.9*   < > 23.6*   < > 25.0* 25.4* 23.8* 23.7* 24.3*  MCV 99.6  --  101.2*  --  103.1*  --  102.5*  --   --   --  103.0*  PLT 98*  --  106*  --  81*  --  89*  --   --   --  79*   < > = values in this interval not displayed.    Basic Metabolic Panel: Recent Labs  Lab 06/02/24 0505 06/02/24 1630 06/03/24 0500 06/03/24 1621 06/04/24 0517 06/04/24 1621 06/05/24 0520 06/05/24 1602 06/06/24 0446  NA 135   < > 135   < > 135 133* 135 135 132*  K 3.9   < > 4.0   < > 4.1 4.0 4.0 4.1 4.3  CL 100   < > 102   < > 102 101 100 101 99  CO2 24   < > 25   < > 25 23 24 23 23   GLUCOSE 129*   < > 151*   < > 122* 157* 130* 147* 124*  BUN 32*   < > 30*   < > 29* 33* 37* 41* 39*  CREATININE 2.37*   < > 1.80*   < > 1.60* 1.73* 1.93* 2.10* 1.84*  CALCIUM  8.2*   < > 8.2*   < > 8.2* 8.1* 8.3* 8.0* 8.0*  MG 2.5*  --  2.5*  --  2.7*  --  2.6*  --  2.8*  PHOS 2.8   < > 3.2   < > 2.4* 3.3 3.3 4.0 3.7   < > = values in this interval not displayed.   GFR: Estimated Creatinine Clearance: 47.6 mL/min (A) (by C-G formula based on SCr of 1.84 mg/dL (H)). Recent Labs  Lab 05/31/24 1028 05/31/24 1451 06/01/24 0457 06/01/24 1125 06/01/24 1410 06/02/24 0505 06/03/24 0500 06/04/24 0517 06/05/24 0520 06/06/24 0446  WBC  --   --    < >  --   --    < > 28.6* 21.8* 25.1* 20.0*  LATICACIDVEN 2.7* 2.6*  --  2.0* 2.0*  --   --   --   --   --    < > = values in this interval not displayed.    Liver Function Tests: Recent Labs  Lab 05/31/24 0459 05/31/24 1558 06/04/24 0517 06/04/24 1621 06/05/24 0520 06/05/24 1602 06/06/24 0446  AST 250*  --   --   --   --   --   --   ALT 94*  --   --   --   --   --   --   ALKPHOS 206*  --   --   --   --   --   --   BILITOT 5.4*  --   --   --   --   --   --   PROT 4.6*  --   --    --   --   --   --   ALBUMIN  2.1*   < > 3.1* 2.9* 2.9* 2.7* 3.0*   < > = values in this interval not displayed.   No results for input(s): LIPASE, AMYLASE in the last 168 hours. No results for input(s): AMMONIA in the last 168 hours.  ABG    Component Value Date/Time   PHART 7.298 (L) 11/27/2021 0345   PCO2ART 39.7 11/27/2021 0345   PO2ART 116 (H) 11/27/2021 0345   HCO3 21.9 05/29/2024 2040   TCO2 21 (L) 08/08/2023 0916   ACIDBASEDEF 2.8 (H) 05/29/2024 2040   O2SAT 73.5 06/01/2024 1125     Coagulation Profile: Recent Labs  Lab 06/01/24 0457 06/02/24 0505 06/03/24 0500 06/04/24 0517 06/05/24 0520  INR 3.5* 2.4* 2.0* 1.7* 1.7*  Cardiac Enzymes: No results for input(s): CKTOTAL, CKMB, CKMBINDEX, TROPONINI in the last 168 hours.  HbA1C: Hgb A1c MFr Bld  Date/Time Value Ref Range Status  11/27/2021 03:28 AM 5.6 4.8 - 5.6 % Final    Comment:    (NOTE) Pre diabetes:          5.7%-6.4%  Diabetes:              >6.4%  Glycemic control for   <7.0% adults with diabetes     CBG: No results for input(s): GLUCAP in the last 168 hours.     DVT/GI PRX  assessed I Assessed the need for Labs I Assessed the need for Foley I Assessed the need for Central Venous Line Family Discussion when available I Assessed the need for Mobilization I made an Assessment of medications to be adjusted accordingly Safety Risk assessment completed   Critical care provider statement:   Total critical care time: 33 minutes   Performed by: Parris MD   Critical care time was exclusive of separately billable procedures and treating other patients.   Critical care was necessary to treat or prevent imminent or life-threatening deterioration.   Critical care was time spent personally by me on the following activities: development of treatment plan with patient and/or surrogate as well as nursing, discussions with consultants, evaluation of patient's response to  treatment, examination of patient, obtaining history from patient or surrogate, ordering and performing treatments and interventions, ordering and review of laboratory studies, ordering and review of radiographic studies, pulse oximetry and re-evaluation of patient's condition.    Ekin Pilar, M.D.  Pulmonary & Critical Care Medicine

## 2024-06-06 NOTE — Progress Notes (Signed)
 PT Cancellation Note  Patient Details Name: Kevin Gross MRN: [id number] DOB: October 21, 1955   Cancelled Treatment:     Pt remains on continuous dialysis until the end of the shift.  Will plan on next PT session when appropriate for mobility/out of bed tasks.     Carmin JONELLE Deed 06/06/2024, 1:09 PM

## 2024-06-07 DIAGNOSIS — F1011 Alcohol abuse, in remission: Secondary | ICD-10-CM | POA: Diagnosis not present

## 2024-06-07 DIAGNOSIS — K7031 Alcoholic cirrhosis of liver with ascites: Secondary | ICD-10-CM | POA: Diagnosis not present

## 2024-06-07 DIAGNOSIS — I81 Portal vein thrombosis: Secondary | ICD-10-CM | POA: Diagnosis not present

## 2024-06-07 DIAGNOSIS — R570 Cardiogenic shock: Secondary | ICD-10-CM | POA: Diagnosis not present

## 2024-06-07 LAB — MAGNESIUM: Magnesium: 2.7 mg/dL — ABNORMAL HIGH (ref 1.7–2.4)

## 2024-06-07 LAB — CBC
HCT: 26.8 % — ABNORMAL LOW (ref 39.0–52.0)
Hemoglobin: 8.3 g/dL — ABNORMAL LOW (ref 13.0–17.0)
MCH: 32.3 pg (ref 26.0–34.0)
MCHC: 31 g/dL (ref 30.0–36.0)
MCV: 104.3 fL — ABNORMAL HIGH (ref 80.0–100.0)
Platelets: 80 K/uL — ABNORMAL LOW (ref 150–400)
RBC: 2.57 MIL/uL — ABNORMAL LOW (ref 4.22–5.81)
RDW: 27.9 % — ABNORMAL HIGH (ref 11.5–15.5)
WBC: 20.3 K/uL — ABNORMAL HIGH (ref 4.0–10.5)
nRBC: 0.1 % (ref 0.0–0.2)

## 2024-06-07 LAB — MISC LABCORP TEST (SEND OUT)
Labcorp test code: 83935
Labcorp test code: 140659

## 2024-06-07 LAB — RENAL FUNCTION PANEL
Albumin: 2.7 g/dL — ABNORMAL LOW (ref 3.5–5.0)
Anion gap: 8 (ref 5–15)
BUN: 51 mg/dL — ABNORMAL HIGH (ref 8–23)
CO2: 23 mmol/L (ref 22–32)
Calcium: 8.4 mg/dL — ABNORMAL LOW (ref 8.9–10.3)
Chloride: 102 mmol/L (ref 98–111)
Creatinine, Ser: 2.53 mg/dL — ABNORMAL HIGH (ref 0.61–1.24)
GFR, Estimated: 27 mL/min — ABNORMAL LOW (ref >60)
Glucose, Bld: 122 mg/dL — ABNORMAL HIGH (ref 70–99)
Phosphorus: 4.6 mg/dL (ref 2.5–4.6)
Potassium: 4.4 mmol/L (ref 3.5–5.1)
Sodium: 133 mmol/L — ABNORMAL LOW (ref 135–145)

## 2024-06-07 LAB — HIV-1/HIV-2 QUAL RNA
HIV-1 RNA, Qualitative: NONREACTIVE
HIV-2 RNA, Qualitative: NONREACTIVE

## 2024-06-07 LAB — LACTIC ACID, PLASMA: Lactic Acid, Venous: 1.8 mmol/L (ref 0.5–1.9)

## 2024-06-07 NOTE — Plan of Care (Signed)
  Problem: Education: Goal: Knowledge of General Education information will improve Description: Including pain rating scale, medication(s)/side effects and non-pharmacologic comfort measures Outcome: Progressing   Problem: Health Behavior/Discharge Planning: Goal: Ability to manage health-related needs will improve Outcome: Not Progressing   Problem: Clinical Measurements: Goal: Ability to maintain clinical measurements within normal limits will improve Outcome: Progressing Goal: Will remain free from infection Outcome: Not Progressing Goal: Diagnostic test results will improve Outcome: Progressing Goal: Respiratory complications will improve Outcome: Progressing Goal: Cardiovascular complication will be avoided Outcome: Progressing   Problem: Nutrition: Goal: Adequate nutrition will be maintained Outcome: Not Progressing

## 2024-06-07 NOTE — Progress Notes (Signed)
 Date of Admission:  05/29/2024      ID: Kevin Gross is a 69 y.o. male Principal Problem:   Septic shock (HCC) Active Problems:   Melena   Gastrointestinal hemorrhage    Subjective: Pt states he is feeling better Still on pressor  Medications:   vitamin C   500 mg Oral BID   Chlorhexidine  Gluconate Cloth  6 each Topical Daily   epoetin  alfa-epbx (RETACRIT ) injection  10,000 Units Subcutaneous Q T,Th,Sat-1800   fluconazole  200 mg Oral QHS   magic mouthwash w/lidocaine   10 mL Oral TID   midodrine   20 mg Oral TID WC   multivitamin  1 tablet Oral QHS   pantoprazole  (PROTONIX ) IV  40 mg Intravenous Q12H   Ensure Max Protein  11 oz Oral BID    Objective: Vital signs in last 24 hours: Patient Vitals for the past 24 hrs:  BP Temp Temp src Pulse Resp SpO2 Weight  06/07/24 1800 (!) 111/54 -- -- (!) 56 17 99 % --  06/07/24 1745 104/65 -- -- (!) 48 15 100 % --  06/07/24 1730 (!) 121/56 -- -- (!) 49 12 100 % --  06/07/24 1715 106/80 -- -- (!) 54 (!) 24 98 % --  06/07/24 1700 (!) 112/52 -- -- (!) 46 12 100 % --  06/07/24 1645 (!) 105/50 -- -- (!) 46 10 100 % --  06/07/24 1630 (!) 107/49 -- -- (!) 46 10 100 % --  06/07/24 1615 (!) 112/52 -- -- (!) 47 11 100 % --  06/07/24 1600 (!) 120/58 (!) 97 F (36.1 C) Axillary (!) 47 10 100 % --  06/07/24 1545 (!) 116/50 -- -- (!) 48 11 100 % --  06/07/24 1530 (!) 135/44 -- -- (!) 48 13 100 % --  06/07/24 1515 (!) 84/46 -- -- (!) 46 10 100 % --  06/07/24 1500 (!) 91/48 -- -- (!) 46 11 100 % --  06/07/24 1445 (!) 104/48 -- -- (!) 45 10 100 % --  06/07/24 1430 (!) 122/50 -- -- (!) 45 19 100 % --  06/07/24 1415 (!) 129/43 -- -- (!) 47 11 100 % --  06/07/24 1400 (!) 84/46 -- -- (!) 49 13 100 % --  06/07/24 1345 (!) 81/46 -- -- (!) 46 11 100 % --  06/07/24 1330 (!) 84/45 -- -- (!) 49 12 100 % --  06/07/24 1315 (!) 100/50 -- -- (!) 50 13 100 % --  06/07/24 1300 (!) 91/53 -- -- (!) 54 19 94 % --  06/07/24 1245 (!) 103/53 -- -- (!) 47 10 100  % --  06/07/24 1230 (!) 109/48 -- -- (!) 46 10 100 % --  06/07/24 1215 112/64 -- -- (!) 47 10 100 % --  06/07/24 1200 (!) 115/54 -- -- (!) 51 11 100 % --  06/07/24 1145 116/86 -- -- (!) 49 12 100 % --  06/07/24 1130 (!) 100/54 -- -- (!) 47 16 100 % --  06/07/24 1115 (!) 81/40 -- -- -- 10 -- --  06/07/24 1100 (!) 92/58 -- -- -- 13 -- --  06/07/24 1045 -- -- -- (!) 45 10 100 % --  06/07/24 1030 -- -- -- -- 11 -- --  06/07/24 1015 -- -- -- -- 19 -- --  06/07/24 1000 -- -- -- (!) 51 15 100 % --  06/07/24 0945 (!) 108/51 -- -- 75 18 (!) 76 % --  06/07/24 0930 (!) 93/54 -- -- Kevin Gross  59 11 100 % --  06/07/24 0915 (!) 75/63 -- -- 63 16 100 % --  06/07/24 0900 (!) 85/54 -- -- 65 15 100 % --  06/07/24 0845 (!) 116/53 -- -- (!) 55 17 100 % --  06/07/24 0830 (!) 108/59 -- -- (!) 59 -- 100 % --  06/07/24 0815 107/67 -- -- (!) 58 -- 100 % --  06/07/24 0800 (!) 101/47 (P) 97.6 F (36.4 C) (P) Oral (!) 45 -- 100 % --  06/07/24 0745 (!) 100/48 -- -- (!) 49 -- 100 % --  06/07/24 0730 (!) 100/53 -- -- (!) 46 -- 100 % --  06/07/24 0715 (!) 103/53 -- -- (!) 46 -- 100 % --  06/07/24 0700 (!) 106/58 -- -- (!) 45 -- 100 % --  06/07/24 0600 (!) 98/45 -- -- (!) 46 -- 100 % --  06/07/24 0500 (!) 90/47 -- -- (!) 46 -- 100 % --  06/07/24 0432 (!) 100/54 -- -- (!) 47 -- 98 % 107.1 kg  06/07/24 0400 (!) 105/49 98.3 F (36.8 C) -- (!) 45 15 100 % --  06/07/24 0300 (!) 99/52 -- -- -- 15 -- --  06/07/24 0200 (!) 103/54 -- -- (!) 44 13 100 % --  06/07/24 0100 (!) 106/48 -- -- (!) 47 15 100 % --  06/07/24 0000 (!) 101/51 98.3 F (36.8 C) -- (!) 43 14 100 % --  06/06/24 2300 (!) 88/60 -- -- (!) 44 13 100 % --  06/06/24 2200 (!) 119/52 -- -- (!) 48 13 100 % --  06/06/24 2100 (!) 124/47 -- -- (!) 47 12 100 % --  06/06/24 2000 (!) 98/48 (!) 97.3 F (36.3 C) -- (!) 46 12 100 % --       PHYSICAL EXAM:  General: awake, pale chronically ill.  Oral cavity thrush Neck: left internal jugular line Back: No CVA  tenderness. Lungs: b/la ir entry. Heart: s1s2 Abdomen: Soft,  distended. Bowel sounds normal. No masses Extremities: venous edema and venous pigmentation rt > left Lymph: Cervical, supraclavicular normal. Neurologic: Grossly non-focal  Lab Results    Latest Ref Rng & Units 06/07/2024    4:16 AM 06/06/2024    6:25 PM 06/06/2024   12:30 PM  CBC  WBC 4.0 - 10.5 K/uL 20.3     Hemoglobin 13.0 - 17.0 g/dL 8.3  8.4  7.6   Hematocrit 39.0 - 52.0 % 26.8  26.7  24.8   Platelets 150 - 400 K/uL 80          Latest Ref Rng & Units 06/07/2024    4:16 AM 06/06/2024    4:18 PM 06/06/2024    4:46 AM  CMP  Glucose 70 - 99 mg/dL 877  852  875   BUN 8 - 23 mg/dL 51  41  39   Creatinine 0.61 - 1.24 mg/dL 7.46  8.02  8.15   Sodium 135 - 145 mmol/L 133  134  132   Potassium 3.5 - 5.1 mmol/L 4.4  4.3  4.3   Chloride 98 - 111 mmol/L 102  101  99   CO2 22 - 32 mmol/L 23  24  23    Calcium  8.9 - 10.3 mg/dL 8.4  8.2  8.0         Assessment/Plan: 68 yr male with decompensated liver cirrhosis, ascites, portal vein thrombosis, ESRD on dialysis   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   Circulatory shock Initially thought to be  septic shock Blood cultures have been negative Off antibiotics now Hypotensive Recommend checking baseline cortisol   Severe esophageal and oropharyngeal candidiasis- improving  On fluconazole adjusted to dialysis  whether CRRT or HD Normal doses 400 mg daily adjusted to his creatinine clearance Will need for minimum of 14 to 21 days      Trichomonas in the esophageal brushing This is unlikely to be Trichomonas vaginalis  can be other commensal( non pathogenic) like Trichomonas tenax present in  the mouth with poor hygiene or Pentatrichomas hominis present in the intestines  On  Flagyl 500 mg twice daily IV.  X 7 days End-stage renal disease on HD   Portal vein thrombosis  Leucocytosis reactive to all of the above- GI bleed, PVT, Esophageal  candidiasis   Melena   Anemia   Thrombocytopenia  Discussed the management with the care team  ID will sign off- call if needed

## 2024-06-07 NOTE — Progress Notes (Signed)
 PHARMACY CONSULT NOTE - ELECTROLYTES  Pharmacy Consult for Electrolyte Monitoring and Replacement   Recent Labs: Height: 5' 9 (175.3 cm) Weight: 107.1 kg (236 lb 1.8 oz) IBW/kg (Calculated) : 70.7 Estimated Creatinine Clearance: 34.7 mL/min (A) (by C-G formula based on SCr of 2.53 mg/dL (H)). Potassium (mmol/L)  Date Value  06/07/2024 4.4   Magnesium  (mg/dL)  Date Value  89/89/7974 2.7 (H)   Calcium  (mg/dL)  Date Value  89/89/7974 8.4 (L)   Albumin  (g/dL)  Date Value  89/89/7974 2.7 (L)   Phosphorus (mg/dL)  Date Value  89/89/7974 4.6   Sodium (mmol/L)  Date Value  06/07/2024 133 (L)  10/12/2021 145 (H)    Assessment  Kevin Gross is a 68 y.o. male presenting with septic shock. PMH significant for Portal vein thrombosis 9/17, CHF, ESRD on HD TTS, ETOH disorder, chronic bradycardia. Pharmacy has been consulted to monitor and replace electrolytes.  Goal of Therapy: Electrolytes WNL  Plan:  No replacement warranted at this time.  Check Renal Panel, mag daily with AM labs  Pharmacy will continue to monitor replace per protocol   Thank you for allowing pharmacy to be a part of this patient's care.  Bernardino George, PharmD Candidate [phone] [facility] [employer] 06/07/2024 8:08 AM

## 2024-06-07 NOTE — TOC Progression Note (Signed)
 Transition of Care [facility]) - Initial/Assessment Note    Patient Details  Name: KIJUAN GALLICCHIO MRN: [id number] Date of Birth: 01/07/1956  [facility] CM/SW Contact:    [place] L Priyana Mccarey, LCSW Phone Number: 06/07/2024, 12:48 PM  Clinical Narrative:                  Chart reviewed. Patient admitted on 9/26. Patient has a history of cirrhosis. Trichomonas diagnosed in patient mouth. Mental status is improving. Patient is A&O x4. He is also an HD patient.    Expected Discharge Plan: Home w Home Health Services Barriers to Discharge: Continued Medical Work up   Patient Goals and CMS Choice            Expected Discharge Plan and Services       Living arrangements for the past 2 months: Single Family Home                           HH Arranged: PT, OT HH Agency: [facility] Date [facility] Agency Contacted: 06/03/24   Representative spoke with at [facility] Agency: [place]is  Prior Living Arrangements/Services Living arrangements for the past 2 months: Single Family Home Lives with:: Spouse, Adult Children Patient language and need for interpreter reviewed:: Yes        Need for Family Participation in Patient Care: Yes (Comment) Care giver support system in place?: Yes (comment) Current home services: Home OT, Home PT, DME Criminal Activity/Legal Involvement Pertinent to Current Situation/Hospitalization: No - Comment as needed  Activities of Daily Living      Permission Sought/Granted                  Emotional Assessment       Orientation: : Oriented to Self, Oriented to Place, Oriented to  Time, Oriented to Situation Alcohol / Substance Use: Not Applicable Psych Involvement: No (comment)  Admission diagnosis:  Septic shock (HCC) [A41.9, R65.21] Patient Active Problem List   Diagnosis Date Noted   Gastrointestinal hemorrhage 06/04/2024   Melena 05/31/2024   Septic shock (HCC) 05/29/2024   Encounter for dialysis and dialysis catheter care  05/22/2024   Generalized weakness 05/20/2024   Diarrhea 05/19/2024   Portal vein thrombosis 05/19/2024   Community acquired pneumonia of right lower lobe of lung 05/17/2024   Shock circulatory (HCC) 05/15/2024   ABLA (acute blood loss anemia) 03/08/2024   Acute on chronic diastolic CHF (congestive heart failure) (HCC) 03/08/2024   Hyperlipidemia 03/08/2024   Paroxysmal atrial fibrillation (HCC) 03/08/2024   Iron  deficiency anemia 07/17/2023   Symptomatic anemia 09/09/2022   Anemia 12/19/2021   Alcoholic cirrhosis of liver with ascites (HCC) 12/13/2021   Pre-diabetes 12/13/2021   OSA (obstructive sleep apnea) 12/13/2021   Hypertension 12/13/2021   Wound infection after surgery 12/12/2021   PAF (paroxysmal atrial fibrillation) (HCC)    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 11/26/2021   Peripheral arterial disease 11/26/2021   Chronic gouty arthritis 04/16/2019   Encounter for long-term (current) use of high-risk medication 04/16/2019   Stage 3b chronic kidney disease (CKD) (HCC) 03/05/2019   Hyperuricemia 03/05/2019   Cellulitis of left lower extremity 02/18/2019   Aortic atherosclerosis 04/10/2018   Class 3 obesity (HCC) 04/10/2018   CAD (coronary artery disease), native coronary artery 04/08/2018   COPD (chronic obstructive pulmonary disease) (HCC) 04/08/2018   Acute renal failure 04/08/2018   Hx of adenomatous colonic polyps 08/29/2016   PCP:  Autry Grayce LABOR, PA Pharmacy:   [facility] - [place], [place] - [address] [place] [address] Phone: [phone] Fax: [phone]  [facility] [address] [place] [address] Phone: [phone] Fax: [phone]     Social Drivers of Health (SDOH) Social History: SDOH Screenings   Food Insecurity: No Food Insecurity (05/29/2024)  Housing: Low Risk  (05/29/2024)  Transportation Needs: No  Transportation Needs (05/29/2024)  Utilities: Not At Risk (05/29/2024)  Depression (PHQ2-9): Low Risk  (03/14/2024)  Financial Resource Strain: Low Risk  (07/17/2023)  Social Connections: Moderately Isolated (05/29/2024)  Tobacco Use: Medium Risk (06/05/2024)   SDOH Interventions:     Readmission Risk Interventions    06/03/2024   11:26 AM 05/16/2024    1:46 PM 09/13/2022   11:09 AM  Readmission Risk Prevention Plan  Transportation Screening Complete Complete Complete  PCP or Specialist Appt within 3-5 Days  Complete   Home Care Screening   Patient refused  Medication Review (RN CM)   Complete  Palliative Care Screening  Not Applicable   Medication Review (RN Care Manager) Complete Complete   PCP or Specialist appointment within 3-5 days of discharge Complete    HRI or Home Care Consult Complete    SW Recovery Care/Counseling Consult Complete    Palliative Care Screening Not Applicable    Skilled Nursing Facility Not Applicable

## 2024-06-07 NOTE — Progress Notes (Signed)
 NAME:  Kevin Gross, MRN:  [id number], DOB:  May 13, 1956, LOS: 9 ADMISSION DATE:  05/29/2024, CONSULTATION DATE:  05/29/2024 REFERRING MD:  Dr. Claudene, CHIEF COMPLAINT:  Generalized Weakness & Hypotension    Brief Pt Description / Synopsis:  68 y.o. male with PMHx significant for ESRD on HD, HFpEF, COPD, OSA, Alcoholic Cirrhosis, and portal vein thrombosis, who is admitted with Multifactorial shock (hypovolemic/hemorrhagic vs septic), Sepsis of unknown etiology (questionable LLE cellulitis vs Pneumonia vs SBP), Acute GI Bleed, and Acute Decompensated Cirrhosis.   History of Present Illness:  Kevin Gross is a 68 y.o. male who presented to [facility] ED via EMS from his home to be evaluated for hypotension and weakness. He reports his initial outpatient hemodialysis appointment was 05/28/2024 at [facility]. This initial appointment is following a recent admission at [facility], of which he was discharged on 05/24/24.   His overall weakness began yesterday following his dialysis appointment and has continued today, with the addition of upper URI symptoms to include minor congestion and a sore throat as of this morning. Upon EMS arrival, he was found to have a BP of 64/28, to which he received fluids and BP increased to 94/42. He reported a sore throat and generalized fatigue/weakness that resulted in a fall this morning. He also stated at baseline he has no issue ambulating with a walker.  06/04/24-patient resting in bed comfortably. He remains hypotensive on vasopressors and dialysis.   06/05/24- patient went for EGD today. I discussed findings with GI Dr Therisa and there is trichomonal infection of pharynx and esophagus which may be seen with severe immunocompromised patients.  He is communicative.  He has oral mucosal sloughing and oral thrush as well.    06/06/24- patient on levophed  low dose, able to tolerate clear diet.  Has R internal jugular and subclavian central line. Severe thrush on EGD.  Trichomonas in  throat and is being treated with ID specialist.   06/07/24- Patient is resting in bed in NAD.  Renal function with decrement in GFR overnight. Being treated for thrichomonal throat infection and thrush. Remains on vasopressor with PO midodrine  20mg  TID.  On zosyn , diflucan and flagyl.  No family has been with patient all week thus far.   Pertinent  Medical History  ETOH Hypertensin Hyperlipidemia Congestive Heart Failure End Stage Renal Disease  Micro Data:  05/29/24: Blood cultures x2 >> no growth to date  05/29/24: MRSA PCR >> negative 05/29/24: Group A Strep PCR >> negative 05/29/24: COVID/FLU/RSV PCR >> negative 05/29/24: Legionella Urine Antigen >> 05/29/24: Strep Pneumo Urine Antigen >> 05/30/24: Viral Respiratory Panel >> negative   Antimicrobials:   Anti-infectives (From admission, onward)    Start     Dose/Rate Route Frequency Ordered Stop   06/07/24 2200  fluconazole (DIFLUCAN) tablet 200 mg        200 mg Oral Daily at bedtime 06/06/24 1223     06/06/24 1600  fluconazole (DIFLUCAN) IVPB 400 mg  Status:  Discontinued       Placed in And Linked Group   400 mg 100 mL/hr over 120 Minutes Intravenous Every 24 hours 06/05/24 1617 06/06/24 1145   06/06/24 1400  fluconazole (DIFLUCAN) IVPB 200 mg  Status:  Discontinued        200 mg 100 mL/hr over 60 Minutes Intravenous Every 24 hours 06/05/24 1558 06/05/24 1606   06/06/24 1400  fluconazole (DIFLUCAN) IVPB 400 mg  Status:  Discontinued       Placed in And Linked Group  400 mg 100 mL/hr over 120 Minutes Intravenous Every 24 hours 06/05/24 1617 06/06/24 1145   06/06/24 1400  fluconazole (DIFLUCAN) IVPB 400 mg        400 mg 100 mL/hr over 120 Minutes Intravenous  Once 06/06/24 1145 06/06/24 1543   06/05/24 2000  fluconazole (DIFLUCAN) IVPB 400 mg  Status:  Discontinued       Placed in And Linked Group   400 mg 100 mL/hr over 120 Minutes Intravenous Every 24 hours 06/05/24 1606 06/05/24 1617   06/05/24 1800  fluconazole  (DIFLUCAN) IVPB 400 mg  Status:  Discontinued        400 mg 100 mL/hr over 120 Minutes Intravenous  Once 06/05/24 1558 06/05/24 1606   06/05/24 1800  fluconazole (DIFLUCAN) IVPB 400 mg  Status:  Discontinued       Placed in And Linked Group   400 mg 100 mL/hr over 120 Minutes Intravenous Every 24 hours 06/05/24 1606 06/05/24 1617   06/05/24 1800  fluconazole (DIFLUCAN) IVPB 400 mg        400 mg 100 mL/hr over 120 Minutes Intravenous  Once 06/05/24 1617 06/05/24 2009   06/05/24 1700  metroNIDAZOLE (FLAGYL) IVPB 500 mg        500 mg 100 mL/hr over 60 Minutes Intravenous Every 12 hours 06/05/24 1558     06/05/24 1430  fluconazole (DIFLUCAN) tablet 400 mg  Status:  Discontinued        400 mg Oral Daily 06/05/24 1343 06/05/24 1353   05/31/24 1800  piperacillin -tazobactam (ZOSYN ) IVPB 3.375 g  Status:  Discontinued        3.375 g 12.5 mL/hr over 240 Minutes Intravenous Every 6 hours 05/31/24 1300 05/31/24 1305   05/31/24 1400  piperacillin -tazobactam (ZOSYN ) IVPB 3.375 g        3.375 g 100 mL/hr over 30 Minutes Intravenous Every 6 hours 05/31/24 1306 06/06/24 1849   05/29/24 2200  piperacillin -tazobactam (ZOSYN ) IVPB 2.25 g  Status:  Discontinued        2.25 g 100 mL/hr over 30 Minutes Intravenous Every 8 hours 05/29/24 1752 05/31/24 1300   05/29/24 1400  piperacillin -tazobactam (ZOSYN ) IVPB 3.375 g        3.375 g 100 mL/hr over 30 Minutes Intravenous  Once 05/29/24 1353 05/29/24 1432   05/29/24 1400  vancomycin  (VANCOREADY) IVPB 2000 mg/400 mL        2,000 mg 200 mL/hr over 120 Minutes Intravenous  Once 05/29/24 1353 05/29/24 1600       Significant Hospital Events: Including procedures, antibiotic start and stop dates in addition to other pertinent events   05/29/24: Started on Levophed  in the ED following refractory hypotension. Midodrine  10mg  x 1. 1.5L fluids given without improvement. Transferred to the ICU for further management. 2L [place] started with improvement in O2.  05/30/24:  Remains on levophed  and started on vaso overnight. Minimal oxygen requirements. Oliguric with plan for HD today at 11.  Abdominal Arterial Ultrasound with patent portal vein and hepatopetal flow (PORTAL VEIN THROMBOSIS RESOLVED), notes cirrhosis and moderate to large ascites.  05/31/24: No significant events overnight, nursing noted several small episodes of melena.  Hgb down to 6.9 this am, giving 1 unit pRBCs.  Remains on Levophed  and Vasopressin , start stress dose steroids and give Albumin .  Ultrasound yesterday negative for portal vein thrombosis, will give Vitamin K  for supratherapeutic INR.  Nephrology starting CRRT.  10/4 shock and on pressors 10/5 remains on pressors and CRRT  Interim History / Subjective:  Remains critically ill liver cirrhosis and chronic kidney disease   Objective   Blood pressure (!) 84/46, pulse (!) 46, temperature (P) 97.6 F (36.4 C), temperature source (P) Oral, resp. rate 10, height 5' 9 (1.753 m), weight 107.1 kg, SpO2 100%. CVP:  [3 mmHg] 3 mmHg      Intake/Output Summary (Last 24 hours) at 06/07/2024 1604 Last data filed at 06/07/2024 1500 Gross per 24 hour  Intake 467.42 ml  Output 496 ml  Net -28.58 ml   Filed Weights   06/05/24 0416 06/06/24 0500 06/07/24 0432  Weight: 106.6 kg 106.9 kg 107.1 kg     Physical Examination:   General Appearance: No distress  EYES PERRLA, EOM intact.  Icteric sclera NECK Supple, No JVD Pulmonary: normal breath sounds, No wheezing.  CardiovascularNormal S1,S2.  No m/r/g.   Abdomen: Benign, Soft, non-tender. Neurology UE/LE 5/5 strength, no focal deficits Ext pulses intact, cap refill intact +edema ALL OTHER ROS ARE NEGATIVE     Assessment & Plan:  68 yo morbidly obese male admitted for decompensated liver cirrhosis and failure with progressive end stage kidney failure With severe shock hypovolemic/Hemorrhagic vs Septic exacerbated by Decompensated Liver Cirrhosis    Oral thrush - s/p ID eval on  diflucan  Thrichomonal throat infection - on flagyl Moderate protein calorie malnutrition - refeeding risk elevated  AKI on CKD KDIGO 4 - nephrology on case s/p RRT Right lower lobe pneumonia - on zosyn  Possible SBP - on zosyn   Chronic Diastolic CHF Electorlyte derrangements  Acute blood loss anemia - s/p EGD  Alcoholic liver cirrhosis- minimize non essential hepatotoxins Bilateral pleural effusions - diurese as able , consider thoracentesis  Chronic emphysema -neb therapy prn GI/dvt ppx             OVERALL POOR PROGNOSIS PATIENT WITH MULTIORGAN FAILURE   Best Practice (right click and Reselect all SmartList Selections daily)   Diet/type: Regular consistency (see orders) DVT prophylaxis: SCD GI prophylaxis: PPI Lines: Central line, Dialysis Catheter, and yes and it is still needed Foley:  Yes, and it is still needed Code Status:  full code DAILY multidisciplinary goals of care      Labs   CBC: Recent Labs  Lab 06/03/24 0500 06/03/24 0503 06/04/24 0517 06/04/24 1203 06/05/24 0520 06/05/24 1230 06/06/24 0006 06/06/24 0446 06/06/24 1230 06/06/24 1825 06/07/24 0416  WBC 28.6*  --  21.8*  --  25.1*  --   --  20.0*  --   --  20.3*  HGB 8.1*   < > 7.4*   < > 7.9*   < > 7.5* 7.7* 7.6* 8.4* 8.3*  HCT 24.9*   < > 23.6*   < > 25.0*   < > 23.7* 24.3* 24.8* 26.7* 26.8*  MCV 101.2*  --  103.1*  --  102.5*  --   --  103.0*  --   --  104.3*  PLT 106*  --  81*  --  89*  --   --  79*  --   --  80*   < > = values in this interval not displayed.    Basic Metabolic Panel: Recent Labs  Lab 06/03/24 0500 06/03/24 1621 06/04/24 0517 06/04/24 1621 06/05/24 0520 06/05/24 1602 06/06/24 0446 06/06/24 1618 06/07/24 0416  NA 135   < > 135   < > 135 135 132* 134* 133*  K 4.0   < > 4.1   < > 4.0 4.1 4.3 4.3 4.4  CL 102   < >  102   < > 100 101 99 101 102  CO2 25   < > 25   < > 24 23 23 24 23   GLUCOSE 151*   < > 122*   < > 130* 147* 124* 147* 122*  BUN 30*   < > 29*   < > 37*  41* 39* 41* 51*  CREATININE 1.80*   < > 1.60*   < > 1.93* 2.10* 1.84* 1.97* 2.53*  CALCIUM  8.2*   < > 8.2*   < > 8.3* 8.0* 8.0* 8.2* 8.4*  MG 2.5*  --  2.7*  --  2.6*  --  2.8*  --  2.7*  PHOS 3.2   < > 2.4*   < > 3.3 4.0 3.7 3.6 4.6   < > = values in this interval not displayed.   GFR: Estimated Creatinine Clearance: 34.7 mL/min (A) (by C-G formula based on SCr of 2.53 mg/dL (H)). Recent Labs  Lab 06/01/24 1125 06/01/24 1410 06/02/24 0505 06/04/24 0517 06/05/24 0520 06/06/24 0446 06/07/24 0416 06/07/24 1357  WBC  --   --    < > 21.8* 25.1* 20.0* 20.3*  --   LATICACIDVEN 2.0* 2.0*  --   --   --   --   --  1.8   < > = values in this interval not displayed.    Liver Function Tests: Recent Labs  Lab 06/05/24 0520 06/05/24 1602 06/06/24 0446 06/06/24 1618 06/07/24 0416  ALBUMIN  2.9* 2.7* 3.0* 2.7* 2.7*   No results for input(s): LIPASE, AMYLASE in the last 168 hours. No results for input(s): AMMONIA in the last 168 hours.  ABG    Component Value Date/Time   PHART 7.298 (L) 11/27/2021 0345   PCO2ART 39.7 11/27/2021 0345   PO2ART 116 (H) 11/27/2021 0345   HCO3 21.9 05/29/2024 2040   TCO2 21 (L) 08/08/2023 0916   ACIDBASEDEF 2.8 (H) 05/29/2024 2040   O2SAT 73.5 06/01/2024 1125     Coagulation Profile: Recent Labs  Lab 06/01/24 0457 06/02/24 0505 06/03/24 0500 06/04/24 0517 06/05/24 0520  INR 3.5* 2.4* 2.0* 1.7* 1.7*    Cardiac Enzymes: No results for input(s): CKTOTAL, CKMB, CKMBINDEX, TROPONINI in the last 168 hours.  HbA1C: Hgb A1c MFr Bld  Date/Time Value Ref Range Status  11/27/2021 03:28 AM 5.6 4.8 - 5.6 % Final    Comment:    (NOTE) Pre diabetes:          5.7%-6.4%  Diabetes:              >6.4%  Glycemic control for   <7.0% adults with diabetes     CBG: No results for input(s): GLUCAP in the last 168 hours.     DVT/GI PRX  assessed I Assessed the need for Labs I Assessed the need for Foley I Assessed the need for  Central Venous Line Family Discussion when available I Assessed the need for Mobilization I made an Assessment of medications to be adjusted accordingly Safety Risk assessment completed   Critical care provider statement:   Total critical care time: 62 minutes   Performed by: Parris MD   Critical care time was exclusive of separately billable procedures and treating other patients.   Critical care was necessary to treat or prevent imminent or life-threatening deterioration.   Critical care was time spent personally by me on the following activities: development of treatment plan with patient and/or surrogate as well as nursing, discussions with consultants, evaluation of patient's response to  treatment, examination of patient, obtaining history from patient or surrogate, ordering and performing treatments and interventions, ordering and review of laboratory studies, ordering and review of radiographic studies, pulse oximetry and re-evaluation of patient's condition.    Sylina Henion, M.D.  Pulmonary & Critical Care Medicine

## 2024-06-07 NOTE — Progress Notes (Signed)
 [facility]  ROUNDING NOTE   Subjective:   Kevin Gross  is a 68 y.o.  male  with past medical conditions including hypertension, COPD, obstructive sleep apnea with CPAP, anemia, and recent acute kidney injury requiring hemodialysis. Patient presents to ED with decreased blood pressure and has been admitted to ICU for Septic shock (HCC) [A41.9, R65.21]  Patient seen and evaluated at bedside. Alert and oriented.   Update: CRRT was stopped yesterday. Due for hemodialysis again on Saturday. Creatinine up to 2.53. Serum sodium down slightly to 133.   Objective:  Vital signs in last 24 hours:  Temp:  [97.3 F (36.3 C)-98.3 F (36.8 C)] 98.3 F (36.8 C) (10/10 0400) Pulse Rate:  [43-68] 46 (10/10 0600) Resp:  [12-18] 15 (10/10 0400) BP: (82-140)/(39-98) 98/45 (10/10 0600) SpO2:  [93 %-100 %] 100 % (10/10 0600) Weight:  [107.1 kg] 107.1 kg (10/10 0432)  Weight change: 0.2 kg Filed Weights   06/05/24 0416 06/06/24 0500 06/07/24 0432  Weight: 106.6 kg 106.9 kg 107.1 kg    Intake/Output: I/O last 3 completed shifts: In: 1197.8 [P.O.:70; I.V.:412.4; IV Piggyback:715.3] Out: 3114 [Stool:40]   Intake/Output this shift:  No intake/output data recorded.  Physical Exam: General: NAD  Head: Normocephalic, atraumatic. Moist oral mucosal membranes  Eyes: Anicteric  Lungs:  Clear to auscultation, normal effort  Heart: Regular rate and rhythm  Abdomen:  Soft, nontender  Extremities: 2+ peripheral edema  Neurologic: Arousable  Skin: Generalized ecchymosis  Access: Right IJ PermCath    Basic Metabolic Panel: Recent Labs  Lab 06/03/24 0500 06/03/24 1621 06/04/24 0517 06/04/24 1621 06/05/24 0520 06/05/24 1602 06/06/24 0446 06/06/24 1618 06/07/24 0416  NA 135   < > 135   < > 135 135 132* 134* 133*  K 4.0   < > 4.1   < > 4.0 4.1 4.3 4.3 4.4  CL 102   < > 102   < > 100 101 99 101 102  CO2 25   < > 25   < > 24 23 23 24 23   GLUCOSE 151*   < > 122*   < > 130*  147* 124* 147* 122*  BUN 30*   < > 29*   < > 37* 41* 39* 41* 51*  CREATININE 1.80*   < > 1.60*   < > 1.93* 2.10* 1.84* 1.97* 2.53*  CALCIUM  8.2*   < > 8.2*   < > 8.3* 8.0* 8.0* 8.2* 8.4*  MG 2.5*  --  2.7*  --  2.6*  --  2.8*  --  2.7*  PHOS 3.2   < > 2.4*   < > 3.3 4.0 3.7 3.6 4.6   < > = values in this interval not displayed.    Liver Function Tests: Recent Labs  Lab 06/05/24 0520 06/05/24 1602 06/06/24 0446 06/06/24 1618 06/07/24 0416  ALBUMIN  2.9* 2.7* 3.0* 2.7* 2.7*   No results for input(s): LIPASE, AMYLASE in the last 168 hours. No results for input(s): AMMONIA in the last 168 hours.   CBC: Recent Labs  Lab 06/03/24 0500 06/03/24 0503 06/04/24 0517 06/04/24 1203 06/05/24 0520 06/05/24 1230 06/06/24 0006 06/06/24 0446 06/06/24 1230 06/06/24 1825 06/07/24 0416  WBC 28.6*  --  21.8*  --  25.1*  --   --  20.0*  --   --  20.3*  HGB 8.1*   < > 7.4*   < > 7.9*   < > 7.5* 7.7* 7.6* 8.4* 8.3*  HCT 24.9*   < >  23.6*   < > 25.0*   < > 23.7* 24.3* 24.8* 26.7* 26.8*  MCV 101.2*  --  103.1*  --  102.5*  --   --  103.0*  --   --  104.3*  PLT 106*  --  81*  --  89*  --   --  79*  --   --  80*   < > = values in this interval not displayed.    Cardiac Enzymes: No results for input(s): CKTOTAL, CKMB, CKMBINDEX, TROPONINI in the last 168 hours.  BNP: Invalid input(s): POCBNP  CBG: No results for input(s): GLUCAP in the last 168 hours.    Microbiology: Results for orders placed or performed during the hospital encounter of 05/29/24  Resp panel by RT-PCR (RSV, Flu A&B, Covid) Anterior Nasal Swab     Status: None   Collection Time: 05/29/24 12:56 PM   Specimen: Anterior Nasal Swab  Result Value Ref Range Status   SARS Coronavirus 2 by RT PCR NEGATIVE NEGATIVE Final    Comment: (NOTE) SARS-CoV-2 target nucleic acids are NOT DETECTED.  The SARS-CoV-2 RNA is generally detectable in upper respiratory specimens during the acute phase of infection. The  lowest concentration of SARS-CoV-2 viral copies this assay can detect is 138 copies/mL. A negative result does not preclude SARS-Cov-2 infection and should not be used as the sole basis for treatment or other patient management decisions. A negative result may occur with  improper specimen collection/handling, submission of specimen other than nasopharyngeal swab, presence of viral mutation(s) within the areas targeted by this assay, and inadequate number of viral copies(<138 copies/mL). A negative result must be combined with clinical observations, patient history, and epidemiological information. The expected result is Negative.  Fact Sheet for Patients:  BloggerCourse.com  Fact Sheet for Healthcare Providers:  SeriousBroker.it  This test is no t yet approved or cleared by the [place]  FDA and  has been authorized for detection and/or diagnosis of SARS-CoV-2 by FDA under an Emergency Use Authorization (EUA). This EUA will remain  in effect (meaning this test can be used) for the duration of the COVID-19 declaration under Section 564(b)(1) of the Act, 21 U.S.C.section 360bbb-3(b)(1), unless the authorization is terminated  or revoked sooner.       Influenza A by PCR NEGATIVE NEGATIVE Final   Influenza B by PCR NEGATIVE NEGATIVE Final    Comment: (NOTE) The Xpert Xpress SARS-CoV-2/FLU/RSV plus assay is intended as an aid in the diagnosis of influenza from Nasopharyngeal swab specimens and should not be used as a sole basis for treatment. Nasal washings and aspirates are unacceptable for Xpert Xpress SARS-CoV-2/FLU/RSV testing.  Fact Sheet for Patients: BloggerCourse.com  Fact Sheet for Healthcare Providers: SeriousBroker.it  This test is not yet approved or cleared by the [place]  FDA and has been authorized for detection and/or diagnosis of SARS-CoV-2 by FDA under  an Emergency Use Authorization (EUA). This EUA will remain in effect (meaning this test can be used) for the duration of the COVID-19 declaration under Section 564(b)(1) of the Act, 21 U.S.C. section 360bbb-3(b)(1), unless the authorization is terminated or revoked.     Resp Syncytial Virus by PCR NEGATIVE NEGATIVE Final    Comment: (NOTE) Fact Sheet for Patients: BloggerCourse.com  Fact Sheet for Healthcare Providers: SeriousBroker.it  This test is not yet approved or cleared by the [place]  FDA and has been authorized for detection and/or diagnosis of SARS-CoV-2 by FDA under an Emergency Use Authorization (EUA). This EUA will remain  in effect (meaning this test can be used) for the duration of the COVID-19 declaration under Section 564(b)(1) of the Act, 21 U.S.C. section 360bbb-3(b)(1), unless the authorization is terminated or revoked.  Performed at [facility], [address] [place], [place] [address]   Blood culture (routine x 2)     Status: None   Collection Time: 05/29/24 12:56 PM   Specimen: BLOOD  Result Value Ref Range Status   Specimen Description BLOOD RIGHT ANTECUBITAL  Final   Special Requests   Final    BOTTLES DRAWN AEROBIC AND ANAEROBIC Blood Culture results may not be optimal due to an inadequate volume of blood received in culture bottles   Culture   Final    NO GROWTH 5 DAYS Performed at [facility], [address] [place], [place] [address]    Report Status 06/03/2024 FINAL  Final  Blood culture (routine x 2)     Status: None   Collection Time: 05/29/24 12:56 PM   Specimen: BLOOD  Result Value Ref Range Status   Specimen Description BLOOD LEFT ANTECUBITAL  Final   Special Requests   Final    BOTTLES DRAWN AEROBIC AND ANAEROBIC Blood Culture results may not be optimal due to an inadequate volume of blood received in culture bottles   Culture   Final    NO GROWTH 5  DAYS Performed at [facility], [address] [place], [place] [address]    Report Status 06/03/2024 FINAL  Final  MRSA Next Gen by PCR, Nasal     Status: None   Collection Time: 05/29/24  2:58 PM   Specimen: Nasal Mucosa; Nasal Swab  Result Value Ref Range Status   MRSA by PCR Next Gen NOT DETECTED NOT DETECTED Final    Comment: (NOTE) The GeneXpert MRSA Assay (FDA approved for NASAL specimens only), is one component of a comprehensive MRSA colonization surveillance program. It is not intended to diagnose MRSA infection nor to guide or monitor treatment for MRSA infections. Test performance is not FDA approved in patients less than 80 years old. Performed at [facility], [address] [place], [place] [address]   Group A Strep by PCR     Status: None   Collection Time: 05/29/24  2:58 PM   Specimen: Throat; Sterile Swab  Result Value Ref Range Status   Group A Strep by PCR NOT DETECTED NOT DETECTED Final    Comment: Performed at [facility], [address] [place], [place] [address]  Respiratory (~20 pathogens) panel by PCR     Status: None   Collection Time: 05/29/24  3:00 PM  Result Value Ref Range Status   Adenovirus NOT DETECTED NOT DETECTED Final   Coronavirus 229E NOT DETECTED NOT DETECTED Final    Comment: (NOTE) The Coronavirus on the Respiratory Panel, DOES NOT test for the novel  Coronavirus (2019 nCoV)    Coronavirus HKU1 NOT DETECTED NOT DETECTED Final   Coronavirus NL63 NOT DETECTED NOT DETECTED Final   Coronavirus OC43 NOT DETECTED NOT DETECTED Final   Metapneumovirus NOT DETECTED NOT DETECTED Final   Rhinovirus / Enterovirus NOT DETECTED NOT DETECTED Final   Influenza A NOT DETECTED NOT DETECTED Final   Influenza B NOT DETECTED NOT DETECTED Final   Parainfluenza Virus 1 NOT DETECTED NOT DETECTED Final   Parainfluenza Virus 2 NOT DETECTED NOT DETECTED Final   Parainfluenza Virus 3 NOT DETECTED NOT DETECTED Final    Parainfluenza Virus 4 NOT DETECTED NOT DETECTED  Final   Respiratory Syncytial Virus NOT DETECTED NOT DETECTED Final   Bordetella pertussis NOT DETECTED NOT DETECTED Final   Bordetella Parapertussis NOT DETECTED NOT DETECTED Final   Chlamydophila pneumoniae NOT DETECTED NOT DETECTED Final   Mycoplasma pneumoniae NOT DETECTED NOT DETECTED Final    Comment: Performed at [facility], [address] [place], [place] [address]  MRSA Next Gen by PCR, Nasal     Status: None   Collection Time: 05/29/24  3:50 PM   Specimen: Nasal Mucosa; Nasal Swab  Result Value Ref Range Status   MRSA by PCR Next Gen NOT DETECTED NOT DETECTED Final    Comment: (NOTE) The GeneXpert MRSA Assay (FDA approved for NASAL specimens only), is one component of a comprehensive MRSA colonization surveillance program. It is not intended to diagnose MRSA infection nor to guide or monitor treatment for MRSA infections. Test performance is not FDA approved in patients less than 32 years old. Performed at [facility], [address] [place], [place] [address]   KOH prep     Status: None   Collection Time: 06/05/24 10:00 AM   Specimen: Esophagus  Result Value Ref Range Status   Specimen Description ESOPHAGUS  Final   Special Requests Immunocompromised  Final   KOH Prep   Final    YEAST WITH PSEUDOHYPHAE RESULT CALLED TO, READ BACK BY AND VERIFIED WITH: NOTIFIED RACHEL VERDI, RN 1206 06/05/24 THAT TRICHOMONAS WAS ALSO SEEN ON SLIDE  GM Performed at [facility], [address] [place], [place] [address]    Report Status 06/05/2024 FINAL  Final  Gastrointestinal Panel by PCR , Stool     Status: None   Collection Time: 06/05/24  3:04 PM   Specimen: Stool  Result Value Ref Range Status   Campylobacter species NOT DETECTED NOT DETECTED Final   Plesimonas shigelloides NOT DETECTED NOT DETECTED Final   Salmonella species NOT DETECTED NOT DETECTED Final   Yersinia enterocolitica NOT DETECTED NOT  DETECTED Final   Vibrio species NOT DETECTED NOT DETECTED Final   Vibrio cholerae NOT DETECTED NOT DETECTED Final   Enteroaggregative E coli (EAEC) NOT DETECTED NOT DETECTED Final   Enteropathogenic E coli (EPEC) NOT DETECTED NOT DETECTED Final   Enterotoxigenic E coli (ETEC) NOT DETECTED NOT DETECTED Final   Shiga like toxin producing E coli (STEC) NOT DETECTED NOT DETECTED Final   Shigella/Enteroinvasive E coli (EIEC) NOT DETECTED NOT DETECTED Final   Cryptosporidium NOT DETECTED NOT DETECTED Final   Cyclospora cayetanensis NOT DETECTED NOT DETECTED Final   Entamoeba histolytica NOT DETECTED NOT DETECTED Final   Giardia lamblia NOT DETECTED NOT DETECTED Final   Adenovirus F40/41 NOT DETECTED NOT DETECTED Final   Astrovirus NOT DETECTED NOT DETECTED Final   Norovirus GI/GII NOT DETECTED NOT DETECTED Final   Rotavirus A NOT DETECTED NOT DETECTED Final   Sapovirus (I, II, IV, and V) NOT DETECTED NOT DETECTED Final    Comment: Performed at [facility], [address] [place], [place] [address]    Coagulation Studies: Recent Labs    06/05/24 0520  LABPROT 20.5*  INR 1.7*     Urinalysis: No results for input(s): COLORURINE, LABSPEC, PHURINE, GLUCOSEU, HGBUR, BILIRUBINUR, KETONESUR, PROTEINUR, UROBILINOGEN, NITRITE, LEUKOCYTESUR in the last 72 hours.  Invalid input(s): APPERANCEUR     Imaging: No results found.      Medications:    metronidazole Stopped (06/06/24 2203)   norepinephrine  (LEVOPHED ) Adult infusion Stopped (06/06/24 1857)   vasopressin  0.03  Units/min (06/07/24 0300)     vitamin C   500 mg Oral BID   Chlorhexidine  Gluconate Cloth  6 each Topical Daily   epoetin  alfa-epbx (RETACRIT ) injection  10,000 Units Subcutaneous Q T,Th,Sat-1800   fluconazole  200 mg Oral QHS   magic mouthwash w/lidocaine   10 mL Oral TID   midodrine   20 mg Oral TID WC   multivitamin  1 tablet Oral QHS   pantoprazole  (PROTONIX ) IV  40 mg  Intravenous Q12H   Ensure Max Protein  11 oz Oral BID   thiamine   100 mg Oral Daily   alteplase , alteplase , docusate sodium , heparin , heparin , heparin , mouth rinse, polyethylene glycol, zolpidem   Assessment/ Plan:  [name] is a 68 y.o.  male  with past medical conditions including hypertension, COPD, obstructive sleep apnea with CPAP, anemia, and chronic kidney disease stage IV, who was admitted to [facility] on 05/29/2024 for Septic shock (HCC) [A41.9, R65.21]  [facility] [place]/TTS/RT permcath  Acute kidney injury on chronic kidney disease stage IV with Volume overload Baseline creatinine 2.77 with GFR 24 06/30/2024. CT abd/pelvis shows dense lesion on left kidney.  CRRT stopped 06/06/2024. Volume status significantly improved with CRRT.  Creatinine is up off of CRRT at 2.53.  This is to be expected.  We will plan for hemodialysis treatment again tomorrow.     Lab Results  Component Value Date   CREATININE 2.53 (H) 06/07/2024   CREATININE 1.97 (H) 06/06/2024   CREATININE 1.84 (H) 06/06/2024    Intake/Output Summary (Last 24 hours) at 06/07/2024 0741 Last data filed at 06/07/2024 0300 Gross per 24 hour  Intake 816.56 ml  Output 1729 ml  Net -912.44 ml   2. Hypotension likely due to shock secondary to liver cirrhosis.  Patient currently on vasopressin .  Weaning as per pulmonary/critical care.  3. Anemia of chronic kidney disease Lab Results  Component Value Date   HGB 8.3 (L) 06/07/2024  Patient has received multiple blood products during this admission.  Patient be maintained on Retacrit .  4.  Secondary Hyperparathyroidism: with outpatient labs: PTH 52.7, phosphorus 4.1, calcium  8.6 on 7/2.  Lab Results  Component Value Date   CALCIUM  8.4 (L) 06/07/2024   CAION 1.21 08/08/2023   PHOS 4.6 06/07/2024  Phosphorus acceptable at 4.6.   LOS: 9 Sathvik Tiedt 10/10/20257:41 AM

## 2024-06-07 NOTE — Progress Notes (Signed)
 PT Cancellation Note  Patient Details Name: QUANTRELL SPLITT MRN: [id number] DOB: 1956/08/25   Cancelled Treatment:    Reason Eval/Treat Not Completed: Medical issues which prohibited therapy (Spoke with patient and nurse in the room. Currently hypotensive feeling fatigued. PT will continue with attempts)  Randine Essex, PT, MPT  Randine LULLA Essex 06/07/2024, 2:11 PM

## 2024-06-07 NOTE — Progress Notes (Signed)
 OT Cancellation Note  Patient Details Name: Kevin Gross MRN: [id number] DOB: Dec 16, 1955   Cancelled Treatment:    Reason Eval/Treat Not Completed: Medical issues which prohibited therapy. CRRT has been stopped but pt has been hypotensive with last BP of 81/46. OT to hold intervention at this time but continue to follow and re-attempt when pt is next able to actively and safely participate.   Izetta Claude, MS, OTR/L , CBIS ascom [phone]  06/07/24, 2:05 PM

## 2024-06-07 NOTE — Plan of Care (Signed)

## 2024-06-08 ENCOUNTER — Inpatient Hospital Stay

## 2024-06-08 LAB — ALBUMIN, PLEURAL OR PERITONEAL FLUID: Albumin, Fluid: <1.5 g/dL

## 2024-06-08 LAB — CBC
HCT: 28.9 % — ABNORMAL LOW (ref 39.0–52.0)
Hemoglobin: 9.1 g/dL — ABNORMAL LOW (ref 13.0–17.0)
MCH: 32.5 pg (ref 26.0–34.0)
MCHC: 31.5 g/dL (ref 30.0–36.0)
MCV: 103.2 fL — ABNORMAL HIGH (ref 80.0–100.0)
Platelets: 115 K/uL — ABNORMAL LOW (ref 150–400)
RBC: 2.8 MIL/uL — ABNORMAL LOW (ref 4.22–5.81)
RDW: 27.6 % — ABNORMAL HIGH (ref 11.5–15.5)
WBC: 29 K/uL — ABNORMAL HIGH (ref 4.0–10.5)
nRBC: 0.6 % — ABNORMAL HIGH (ref 0.0–0.2)

## 2024-06-08 LAB — BODY FLUID CELL COUNT WITH DIFFERENTIAL
Eos, Fluid: 0 %
Lymphs, Fluid: 27 %
Monocyte-Macrophage-Serous Fluid: 55 %
Neutrophil Count, Fluid: 17 %
Other Cells, Fluid: 1 %
Total Nucleated Cell Count, Fluid: 29 uL

## 2024-06-08 LAB — RENAL FUNCTION PANEL
Albumin: 2.3 g/dL — ABNORMAL LOW (ref 3.5–5.0)
Anion gap: 14 (ref 5–15)
BUN: 76 mg/dL — ABNORMAL HIGH (ref 8–23)
CO2: 21 mmol/L — ABNORMAL LOW (ref 22–32)
Calcium: 8.4 mg/dL — ABNORMAL LOW (ref 8.9–10.3)
Chloride: 99 mmol/L (ref 98–111)
Creatinine, Ser: 3.88 mg/dL — ABNORMAL HIGH (ref 0.61–1.24)
GFR, Estimated: 16 mL/min — ABNORMAL LOW (ref >60)
Glucose, Bld: 110 mg/dL — ABNORMAL HIGH (ref 70–99)
Phosphorus: 5.1 mg/dL — ABNORMAL HIGH (ref 2.5–4.6)
Potassium: 5 mmol/L (ref 3.5–5.1)
Sodium: 134 mmol/L — ABNORMAL LOW (ref 135–145)

## 2024-06-08 LAB — PROTIME-INR
INR: 1.7 — ABNORMAL HIGH (ref 0.8–1.2)
Prothrombin Time: 21.2 s — ABNORMAL HIGH (ref 11.4–15.2)

## 2024-06-08 LAB — CORTISOL-AM, BLOOD: Cortisol - AM: 59.5 ug/dL — ABNORMAL HIGH (ref 6.7–22.6)

## 2024-06-08 LAB — MAGNESIUM: Magnesium: 3 mg/dL — ABNORMAL HIGH (ref 1.7–2.4)

## 2024-06-08 MED ORDER — HEPARIN SODIUM (PORCINE) 1000 UNIT/ML IJ SOLN
INTRAMUSCULAR | Status: AC
  Filled 2024-06-08: qty 5

## 2024-06-08 MED ORDER — ALBUMIN HUMAN 25 % IV SOLN
50.0000 g | Freq: Once | INTRAVENOUS | Status: AC
  Administered 2024-06-08: 50 g via INTRAVENOUS
  Filled 2024-06-08: qty 200

## 2024-06-08 NOTE — Progress Notes (Signed)
 PHARMACY CONSULT NOTE - ELECTROLYTES  Pharmacy Consult for Electrolyte Monitoring and Replacement   Recent Labs: Height: 5' 9 (175.3 cm) Weight: 107.3 kg (236 lb 8.9 oz) IBW/kg (Calculated) : 70.7 Estimated Creatinine Clearance: 22.6 mL/min (A) (by C-G formula based on SCr of 3.88 mg/dL (H)). Potassium (mmol/L)  Date Value  06/08/2024 5.0   Magnesium  (mg/dL)  Date Value  89/88/7974 3.0 (H)   Calcium  (mg/dL)  Date Value  89/88/7974 8.4 (L)   Albumin  (g/dL)  Date Value  89/88/7974 2.3 (L)   Phosphorus (mg/dL)  Date Value  89/88/7974 5.1 (H)   Sodium (mmol/L)  Date Value  06/08/2024 134 (L)  10/12/2021 145 (H)    Assessment  Kevin Gross is a 68 y.o. male presenting with septic shock. PMH significant for Portal vein thrombosis 9/17, CHF, ESRD on HD TTS, ETOH disorder, chronic bradycardia. Pharmacy has been consulted to monitor and replace electrolytes.  Goal of Therapy: Electrolytes WNL  Plan:  Patient due for HD today Electrolytes are overall stable. Will sign off on consult and defer management of electrolytes to primary team and nephrology  Thank you for allowing pharmacy to be a part of this patient's care.  Mavrick Mcquigg B Brooklin Rieger 06/08/2024 7:00 AM

## 2024-06-08 NOTE — Progress Notes (Signed)
 NAME:  Kevin Gross, MRN:  [id number], DOB:  [birth year], LOS: 10 ADMISSION DATE:  05/29/2024, CONSULTATION DATE:  05/29/2024 REFERRING MD:  Dr. Claudene, CHIEF COMPLAINT:  Generalized Weakness & Hypotension    Brief Pt Description / Synopsis:  68 y.o. male with PMHx significant for ESRD on HD, HFpEF, COPD, OSA, Alcoholic Cirrhosis, and portal vein thrombosis, who is admitted with Multifactorial shock (hypovolemic/hemorrhagic vs septic), Sepsis of unknown etiology (questionable LLE cellulitis vs Pneumonia vs SBP), Acute GI Bleed, and Acute Decompensated Cirrhosis.   History of Present Illness:  [name] is a 68 y.o. male who presented to [facility] ED via EMS from his home to be evaluated for hypotension and weakness. He reports his initial outpatient hemodialysis appointment was 05/28/2024 at [facility]. This initial appointment is following a recent admission at [facility], of which he was discharged on 05/24/24.   His overall weakness began yesterday following his dialysis appointment and has continued today, with the addition of upper URI symptoms to include minor congestion and a sore throat as of this morning. Upon EMS arrival, he was found to have a BP of 64/28, to which he received fluids and BP increased to 94/42. He reported a sore throat and generalized fatigue/weakness that resulted in a fall this morning. He also stated at baseline he has no issue ambulating with a walker.  06/04/24-patient resting in bed comfortably. He remains hypotensive on vasopressors and dialysis.   06/05/24- patient went for EGD today. I discussed findings with GI Dr Therisa and there is trichomonal infection of pharynx and esophagus which may be seen with severe immunocompromised patients.  He is communicative.  He has oral mucosal sloughing and oral thrush as well.    06/06/24- patient on levophed  low dose, able to tolerate clear diet.  Has R internal jugular and subclavian central line. Severe thrush on EGD.  Trichomonas in  throat and is being treated with ID specialist.   06/07/24- Patient is resting in bed in NAD.  Renal function with decrement in GFR overnight. Being treated for thrichomonal throat infection and thrush. Remains on vasopressor with PO midodrine  20mg  TID.  On zosyn , diflucan and flagyl.  No family has been with patient all week thus far.   06/08/24- patient back on levophed  and vasopressin  infusion due to worsening shock physiology. He has large abd ascites and we may need to perform abd paracentesis today.  There is concern for abd compartment.  Pertinent  Medical History  ETOH Hypertensin Hyperlipidemia Congestive Heart Failure End Stage Renal Disease  Micro Data:  05/29/24: Blood cultures x2 >> no growth to date  05/29/24: MRSA PCR >> negative 05/29/24: Group A Strep PCR >> negative 05/29/24: COVID/FLU/RSV PCR >> negative 05/29/24: Legionella Urine Antigen >> 05/29/24: Strep Pneumo Urine Antigen >> 05/30/24: Viral Respiratory Panel >> negative   Antimicrobials:   Anti-infectives (From admission, onward)    Start     Dose/Rate Route Frequency Ordered Stop   06/07/24 2200  fluconazole (DIFLUCAN) tablet 200 mg        200 mg Oral Daily at bedtime 06/06/24 1223     06/06/24 1600  fluconazole (DIFLUCAN) IVPB 400 mg  Status:  Discontinued       Placed in And Linked Group   400 mg 100 mL/hr over 120 Minutes Intravenous Every 24 hours 06/05/24 1617 06/06/24 1145   06/06/24 1400  fluconazole (DIFLUCAN) IVPB 200 mg  Status:  Discontinued        200 mg 100 mL/hr over  60 Minutes Intravenous Every 24 hours 06/05/24 1558 06/05/24 1606   06/06/24 1400  fluconazole (DIFLUCAN) IVPB 400 mg  Status:  Discontinued       Placed in And Linked Group   400 mg 100 mL/hr over 120 Minutes Intravenous Every 24 hours 06/05/24 1617 06/06/24 1145   06/06/24 1400  fluconazole (DIFLUCAN) IVPB 400 mg        400 mg 100 mL/hr over 120 Minutes Intravenous  Once 06/06/24 1145 06/06/24 1543   06/05/24 2000   fluconazole (DIFLUCAN) IVPB 400 mg  Status:  Discontinued       Placed in And Linked Group   400 mg 100 mL/hr over 120 Minutes Intravenous Every 24 hours 06/05/24 1606 06/05/24 1617   06/05/24 1800  fluconazole (DIFLUCAN) IVPB 400 mg  Status:  Discontinued        400 mg 100 mL/hr over 120 Minutes Intravenous  Once 06/05/24 1558 06/05/24 1606   06/05/24 1800  fluconazole (DIFLUCAN) IVPB 400 mg  Status:  Discontinued       Placed in And Linked Group   400 mg 100 mL/hr over 120 Minutes Intravenous Every 24 hours 06/05/24 1606 06/05/24 1617   06/05/24 1800  fluconazole (DIFLUCAN) IVPB 400 mg        400 mg 100 mL/hr over 120 Minutes Intravenous  Once 06/05/24 1617 06/05/24 2009   06/05/24 1700  metroNIDAZOLE (FLAGYL) IVPB 500 mg        500 mg 100 mL/hr over 60 Minutes Intravenous Every 12 hours 06/05/24 1558     06/05/24 1430  fluconazole (DIFLUCAN) tablet 400 mg  Status:  Discontinued        400 mg Oral Daily 06/05/24 1343 06/05/24 1353   05/31/24 1800  piperacillin -tazobactam (ZOSYN ) IVPB 3.375 g  Status:  Discontinued        3.375 g 12.5 mL/hr over 240 Minutes Intravenous Every 6 hours 05/31/24 1300 05/31/24 1305   05/31/24 1400  piperacillin -tazobactam (ZOSYN ) IVPB 3.375 g        3.375 g 100 mL/hr over 30 Minutes Intravenous Every 6 hours 05/31/24 1306 06/06/24 1849   05/29/24 2200  piperacillin -tazobactam (ZOSYN ) IVPB 2.25 g  Status:  Discontinued        2.25 g 100 mL/hr over 30 Minutes Intravenous Every 8 hours 05/29/24 1752 05/31/24 1300   05/29/24 1400  piperacillin -tazobactam (ZOSYN ) IVPB 3.375 g        3.375 g 100 mL/hr over 30 Minutes Intravenous  Once 05/29/24 1353 05/29/24 1432   05/29/24 1400  vancomycin  (VANCOREADY) IVPB 2000 mg/400 mL        2,000 mg 200 mL/hr over 120 Minutes Intravenous  Once 05/29/24 1353 05/29/24 1600       Significant Hospital Events: Including procedures, antibiotic start and stop dates in addition to other pertinent events   05/29/24:  Started on Levophed  in the ED following refractory hypotension. Midodrine  10mg  x 1. 1.5L fluids given without improvement. Transferred to the ICU for further management. 2L [place] started with improvement in O2.  05/30/24: Remains on levophed  and started on vaso overnight. Minimal oxygen requirements. Oliguric with plan for HD today at 11.  Abdominal Arterial Ultrasound with patent portal vein and hepatopetal flow (PORTAL VEIN THROMBOSIS RESOLVED), notes cirrhosis and moderate to large ascites.  05/31/24: No significant events overnight, nursing noted several small episodes of melena.  Hgb down to 6.9 this am, giving 1 unit pRBCs.  Remains on Levophed  and Vasopressin , start stress dose steroids and give Albumin .  Ultrasound yesterday negative for portal vein thrombosis, will give Vitamin K  for supratherapeutic INR.  Nephrology starting CRRT.  10/4 shock and on pressors 10/5 remains on pressors and CRRT  Interim History / Subjective:  Remains critically ill liver cirrhosis and chronic kidney disease   Objective   Blood pressure (!) 106/56, pulse 68, temperature 98.6 F (37 C), resp. rate 15, height 5' 9 (1.753 m), weight 107.3 kg, SpO2 96%. CVP:  [3 mmHg] 3 mmHg      Intake/Output Summary (Last 24 hours) at 06/08/2024 1021 Last data filed at 06/08/2024 0947 Gross per 24 hour  Intake 533 ml  Output --  Net 533 ml   Filed Weights   06/06/24 0500 06/07/24 0432 06/08/24 0433  Weight: 106.9 kg 107.1 kg 107.3 kg     Physical Examination:   General Appearance: No distress  EYES PERRLA, EOM intact.  Icteric sclera NECK Supple, No JVD Pulmonary: normal breath sounds, No wheezing.  CardiovascularNormal S1,S2.  No m/r/g.   Abdomen: Benign, Soft, non-tender. Neurology UE/LE 5/5 strength, no focal deficits Ext pulses intact, cap refill intact +edema ALL OTHER ROS ARE NEGATIVE     Assessment & Plan:  68 yo morbidly obese male admitted for decompensated liver cirrhosis and failure with  progressive end stage kidney failure With severe shock hypovolemic/Hemorrhagic vs Septic exacerbated by Decompensated Liver Cirrhosis    Oral thrush - s/p ID eval on diflucan  Thrichomonal throat infection - on flagyl Moderate protein calorie malnutrition - refeeding risk elevated  AKI on CKD KDIGO 4 - nephrology on case s/p RRT Right lower lobe pneumonia - on zosyn  Possible SBP - on zosyn   Chronic Diastolic CHF Electorlyte derrangements  Acute blood loss anemia - s/p EGD  Alcoholic liver cirrhosis- minimize non essential hepatotoxins Bilateral pleural effusions - diurese as able , consider thoracentesis  Chronic emphysema -neb therapy prn GI/dvt ppx             OVERALL POOR PROGNOSIS PATIENT WITH MULTIORGAN FAILURE   Best Practice (right click and Reselect all SmartList Selections daily)   Diet/type: Regular consistency (see orders) DVT prophylaxis: SCD GI prophylaxis: PPI Lines: Central line, Dialysis Catheter, and yes and it is still needed Foley:  Yes, and it is still needed Code Status:  full code DAILY multidisciplinary goals of care      Labs   CBC: Recent Labs  Lab 06/04/24 0517 06/04/24 1203 06/05/24 0520 06/05/24 1230 06/06/24 0446 06/06/24 1230 06/06/24 1825 06/07/24 0416 06/08/24 0417  WBC 21.8*  --  25.1*  --  20.0*  --   --  20.3* 29.0*  HGB 7.4*   < > 7.9*   < > 7.7* 7.6* 8.4* 8.3* 9.1*  HCT 23.6*   < > 25.0*   < > 24.3* 24.8* 26.7* 26.8* 28.9*  MCV 103.1*  --  102.5*  --  103.0*  --   --  104.3* 103.2*  PLT 81*  --  89*  --  79*  --   --  80* 115*   < > = values in this interval not displayed.    Basic Metabolic Panel: Recent Labs  Lab 06/04/24 0517 06/04/24 1621 06/05/24 0520 06/05/24 1602 06/06/24 0446 06/06/24 1618 06/07/24 0416 06/08/24 0417  NA 135   < > 135 135 132* 134* 133* 134*  K 4.1   < > 4.0 4.1 4.3 4.3 4.4 5.0  CL 102   < > 100 101 99 101 102 99  CO2 25   < >  24 23 23 24 23  21*  GLUCOSE 122*   < > 130* 147* 124*  147* 122* 110*  BUN 29*   < > 37* 41* 39* 41* 51* 76*  CREATININE 1.60*   < > 1.93* 2.10* 1.84* 1.97* 2.53* 3.88*  CALCIUM  8.2*   < > 8.3* 8.0* 8.0* 8.2* 8.4* 8.4*  MG 2.7*  --  2.6*  --  2.8*  --  2.7* 3.0*  PHOS 2.4*   < > 3.3 4.0 3.7 3.6 4.6 5.1*   < > = values in this interval not displayed.   GFR: Estimated Creatinine Clearance: 22.6 mL/min (A) (by C-G formula based on SCr of 3.88 mg/dL (H)). Recent Labs  Lab 06/01/24 1125 06/01/24 1410 06/02/24 0505 06/05/24 0520 06/06/24 0446 06/07/24 0416 06/07/24 1357 06/08/24 0417  WBC  --   --    < > 25.1* 20.0* 20.3*  --  29.0*  LATICACIDVEN 2.0* 2.0*  --   --   --   --  1.8  --    < > = values in this interval not displayed.    Liver Function Tests: Recent Labs  Lab 06/05/24 1602 06/06/24 0446 06/06/24 1618 06/07/24 0416 06/08/24 0417  ALBUMIN  2.7* 3.0* 2.7* 2.7* 2.3*   No results for input(s): LIPASE, AMYLASE in the last 168 hours. No results for input(s): AMMONIA in the last 168 hours.  ABG    Component Value Date/Time   PHART 7.298 (L) 11/27/2021 0345   PCO2ART 39.7 11/27/2021 0345   PO2ART 116 (H) 11/27/2021 0345   HCO3 21.9 05/29/2024 2040   TCO2 21 (L) 08/08/2023 0916   ACIDBASEDEF 2.8 (H) 05/29/2024 2040   O2SAT 73.5 06/01/2024 1125     Coagulation Profile: Recent Labs  Lab 06/02/24 0505 06/03/24 0500 06/04/24 0517 06/05/24 0520  INR 2.4* 2.0* 1.7* 1.7*    Cardiac Enzymes: No results for input(s): CKTOTAL, CKMB, CKMBINDEX, TROPONINI in the last 168 hours.  HbA1C: Hgb A1c MFr Bld  Date/Time Value Ref Range Status  11/27/2021 03:28 AM 5.6 4.8 - 5.6 % Final    Comment:    (NOTE) Pre diabetes:          5.7%-6.4%  Diabetes:              >6.4%  Glycemic control for   <7.0% adults with diabetes     CBG: No results for input(s): GLUCAP in the last 168 hours.     DVT/GI PRX  assessed I Assessed the need for Labs I Assessed the need for Foley I Assessed the need for  Central Venous Line Family Discussion when available I Assessed the need for Mobilization I made an Assessment of medications to be adjusted accordingly Safety Risk assessment completed   Critical care provider statement:   Total critical care time: 32 minutes   Performed by: Parris MD   Critical care time was exclusive of separately billable procedures and treating other patients.   Critical care was necessary to treat or prevent imminent or life-threatening deterioration.   Critical care was time spent personally by me on the following activities: development of treatment plan with patient and/or surrogate as well as nursing, discussions with consultants, evaluation of patient's response to treatment, examination of patient, obtaining history from patient or surrogate, ordering and performing treatments and interventions, ordering and review of laboratory studies, ordering and review of radiographic studies, pulse oximetry and re-evaluation of patient's condition.    Marykatherine Sherwood, M.D.  Pulmonary & Critical Care Medicine

## 2024-06-08 NOTE — Progress Notes (Signed)
 [facility]  ROUNDING NOTE   Subjective:   Kevin Gross  is a 68 y.o.  male  with past medical conditions including hypertension, COPD, obstructive sleep apnea with CPAP, anemia, and recent acute kidney injury requiring hemodialysis. Patient presents to ED with decreased blood pressure and has been admitted to ICU for Septic shock (HCC) [A41.9, R65.21]  Patient seen and evaluated at bedside. Alert and oriented.   Update: Patient reports some abdominal pain this a.m. Due for hemodialysis treatment later today. Still on 2 pressors.   Objective:  Vital signs in last 24 hours:  Temp:  [97 F (36.1 C)-98.9 F (37.2 C)] 98.6 F (37 C) (10/11 0400) Pulse Rate:  [45-75] 66 (10/11 0600) Resp:  [10-24] 16 (10/11 0600) BP: (75-135)/(40-86) 114/53 (10/11 0600) SpO2:  [76 %-100 %] 92 % (10/11 0600) Weight:  [107.3 kg] 107.3 kg (10/11 0433)  Weight change: 0.2 kg Filed Weights   06/06/24 0500 06/07/24 0432 06/08/24 0433  Weight: 106.9 kg 107.1 kg 107.3 kg    Intake/Output: I/O last 3 completed shifts: In: 731.6 [I.V.:381.6; IV Piggyback:350] Out: -    Intake/Output this shift:  No intake/output data recorded.  Physical Exam: General: NAD  Head: Normocephalic, atraumatic. Moist oral mucosal membranes  Eyes: Anicteric  Lungs:  Clear to auscultation, normal effort  Heart: Regular rate and rhythm  Abdomen:  Soft, mild diffuse tenderness  Extremities: 2+ peripheral edema  Neurologic: Arousable  Skin: Generalized ecchymosis  Access: Right IJ PermCath    Basic Metabolic Panel: Recent Labs  Lab 06/04/24 0517 06/04/24 1621 06/05/24 0520 06/05/24 1602 06/06/24 0446 06/06/24 1618 06/07/24 0416 06/08/24 0417  NA 135   < > 135 135 132* 134* 133* 134*  K 4.1   < > 4.0 4.1 4.3 4.3 4.4 5.0  CL 102   < > 100 101 99 101 102 99  CO2 25   < > 24 23 23 24 23  21*  GLUCOSE 122*   < > 130* 147* 124* 147* 122* 110*  BUN 29*   < > 37* 41* 39* 41* 51* 76*  CREATININE  1.60*   < > 1.93* 2.10* 1.84* 1.97* 2.53* 3.88*  CALCIUM  8.2*   < > 8.3* 8.0* 8.0* 8.2* 8.4* 8.4*  MG 2.7*  --  2.6*  --  2.8*  --  2.7* 3.0*  PHOS 2.4*   < > 3.3 4.0 3.7 3.6 4.6 5.1*   < > = values in this interval not displayed.    Liver Function Tests: Recent Labs  Lab 06/05/24 1602 06/06/24 0446 06/06/24 1618 06/07/24 0416 06/08/24 0417  ALBUMIN  2.7* 3.0* 2.7* 2.7* 2.3*   No results for input(s): LIPASE, AMYLASE in the last 168 hours. No results for input(s): AMMONIA in the last 168 hours.   CBC: Recent Labs  Lab 06/04/24 0517 06/04/24 1203 06/05/24 0520 06/05/24 1230 06/06/24 0446 06/06/24 1230 06/06/24 1825 06/07/24 0416 06/08/24 0417  WBC 21.8*  --  25.1*  --  20.0*  --   --  20.3* 29.0*  HGB 7.4*   < > 7.9*   < > 7.7* 7.6* 8.4* 8.3* 9.1*  HCT 23.6*   < > 25.0*   < > 24.3* 24.8* 26.7* 26.8* 28.9*  MCV 103.1*  --  102.5*  --  103.0*  --   --  104.3* 103.2*  PLT 81*  --  89*  --  79*  --   --  80* 115*   < > = values  in this interval not displayed.    Cardiac Enzymes: No results for input(s): CKTOTAL, CKMB, CKMBINDEX, TROPONINI in the last 168 hours.  BNP: Invalid input(s): POCBNP  CBG: No results for input(s): GLUCAP in the last 168 hours.    Microbiology: Results for orders placed or performed during the hospital encounter of 05/29/24  Resp panel by RT-PCR (RSV, Flu A&B, Covid) Anterior Nasal Swab     Status: None   Collection Time: 05/29/24 12:56 PM   Specimen: Anterior Nasal Swab  Result Value Ref Range Status   SARS Coronavirus 2 by RT PCR NEGATIVE NEGATIVE Final    Comment: (NOTE) SARS-CoV-2 target nucleic acids are NOT DETECTED.  The SARS-CoV-2 RNA is generally detectable in upper respiratory specimens during the acute phase of infection. The lowest concentration of SARS-CoV-2 viral copies this assay can detect is 138 copies/mL. A negative result does not preclude SARS-Cov-2 infection and should not be used as the sole  basis for treatment or other patient management decisions. A negative result may occur with  improper specimen collection/handling, submission of specimen other than nasopharyngeal swab, presence of viral mutation(s) within the areas targeted by this assay, and inadequate number of viral copies(<138 copies/mL). A negative result must be combined with clinical observations, patient history, and epidemiological information. The expected result is Negative.  Fact Sheet for Patients:  BloggerCourse.com  Fact Sheet for Healthcare Providers:  SeriousBroker.it  This test is no t yet approved or cleared by the [place]  FDA and  has been authorized for detection and/or diagnosis of SARS-CoV-2 by FDA under an Emergency Use Authorization (EUA). This EUA will remain  in effect (meaning this test can be used) for the duration of the COVID-19 declaration under Section 564(b)(1) of the Act, 21 U.S.C.section 360bbb-3(b)(1), unless the authorization is terminated  or revoked sooner.       Influenza A by PCR NEGATIVE NEGATIVE Final   Influenza B by PCR NEGATIVE NEGATIVE Final    Comment: (NOTE) The Xpert Xpress SARS-CoV-2/FLU/RSV plus assay is intended as an aid in the diagnosis of influenza from Nasopharyngeal swab specimens and should not be used as a sole basis for treatment. Nasal washings and aspirates are unacceptable for Xpert Xpress SARS-CoV-2/FLU/RSV testing.  Fact Sheet for Patients: BloggerCourse.com  Fact Sheet for Healthcare Providers: SeriousBroker.it  This test is not yet approved or cleared by the [place]  FDA and has been authorized for detection and/or diagnosis of SARS-CoV-2 by FDA under an Emergency Use Authorization (EUA). This EUA will remain in effect (meaning this test can be used) for the duration of the COVID-19 declaration under Section 564(b)(1) of the Act,  21 U.S.C. section 360bbb-3(b)(1), unless the authorization is terminated or revoked.     Resp Syncytial Virus by PCR NEGATIVE NEGATIVE Final    Comment: (NOTE) Fact Sheet for Patients: BloggerCourse.com  Fact Sheet for Healthcare Providers: SeriousBroker.it  This test is not yet approved or cleared by the [place]  FDA and has been authorized for detection and/or diagnosis of SARS-CoV-2 by FDA under an Emergency Use Authorization (EUA). This EUA will remain in effect (meaning this test can be used) for the duration of the COVID-19 declaration under Section 564(b)(1) of the Act, 21 U.S.C. section 360bbb-3(b)(1), unless the authorization is terminated or revoked.  Performed at [facility], [address] [place], [place] [address]   Blood culture (routine x 2)     Status: None   Collection Time: 05/29/24 12:56 PM   Specimen: BLOOD  Result  Value Ref Range Status   Specimen Description BLOOD RIGHT ANTECUBITAL  Final   Special Requests   Final    BOTTLES DRAWN AEROBIC AND ANAEROBIC Blood Culture results may not be optimal due to an inadequate volume of blood received in culture bottles   Culture   Final    NO GROWTH 5 DAYS Performed at [facility], [address] [place], [place] [address]    Report Status 06/03/2024 FINAL  Final  Blood culture (routine x 2)     Status: None   Collection Time: 05/29/24 12:56 PM   Specimen: BLOOD  Result Value Ref Range Status   Specimen Description BLOOD LEFT ANTECUBITAL  Final   Special Requests   Final    BOTTLES DRAWN AEROBIC AND ANAEROBIC Blood Culture results may not be optimal due to an inadequate volume of blood received in culture bottles   Culture   Final    NO GROWTH 5 DAYS Performed at [facility], [address] [place], [place] [address]    Report Status 06/03/2024 FINAL  Final  MRSA Next Gen by PCR, Nasal     Status: None   Collection  Time: 05/29/24  2:58 PM   Specimen: Nasal Mucosa; Nasal Swab  Result Value Ref Range Status   MRSA by PCR Next Gen NOT DETECTED NOT DETECTED Final    Comment: (NOTE) The GeneXpert MRSA Assay (FDA approved for NASAL specimens only), is one component of a comprehensive MRSA colonization surveillance program. It is not intended to diagnose MRSA infection nor to guide or monitor treatment for MRSA infections. Test performance is not FDA approved in patients less than 55 years old. Performed at [facility], [address] [place], [place] [address]   Group A Strep by PCR     Status: None   Collection Time: 05/29/24  2:58 PM   Specimen: Throat; Sterile Swab  Result Value Ref Range Status   Group A Strep by PCR NOT DETECTED NOT DETECTED Final    Comment: Performed at [facility], [address] [place], [place] [address]  Respiratory (~20 pathogens) panel by PCR     Status: None   Collection Time: 05/29/24  3:00 PM  Result Value Ref Range Status   Adenovirus NOT DETECTED NOT DETECTED Final   Coronavirus 229E NOT DETECTED NOT DETECTED Final    Comment: (NOTE) The Coronavirus on the Respiratory Panel, DOES NOT test for the novel  Coronavirus (2019 nCoV)    Coronavirus HKU1 NOT DETECTED NOT DETECTED Final   Coronavirus NL63 NOT DETECTED NOT DETECTED Final   Coronavirus OC43 NOT DETECTED NOT DETECTED Final   Metapneumovirus NOT DETECTED NOT DETECTED Final   Rhinovirus / Enterovirus NOT DETECTED NOT DETECTED Final   Influenza A NOT DETECTED NOT DETECTED Final   Influenza B NOT DETECTED NOT DETECTED Final   Parainfluenza Virus 1 NOT DETECTED NOT DETECTED Final   Parainfluenza Virus 2 NOT DETECTED NOT DETECTED Final   Parainfluenza Virus 3 NOT DETECTED NOT DETECTED Final   Parainfluenza Virus 4 NOT DETECTED NOT DETECTED Final   Respiratory Syncytial Virus NOT DETECTED NOT DETECTED Final   Bordetella pertussis NOT DETECTED NOT DETECTED Final   Bordetella  Parapertussis NOT DETECTED NOT DETECTED Final   Chlamydophila pneumoniae NOT DETECTED NOT DETECTED Final   Mycoplasma pneumoniae NOT DETECTED NOT DETECTED Final    Comment: Performed at [facility], [address], [place], [place] [address]  MRSA Next Gen by PCR, Nasal  Status: None   Collection Time: 05/29/24  3:50 PM   Specimen: Nasal Mucosa; Nasal Swab  Result Value Ref Range Status   MRSA by PCR Next Gen NOT DETECTED NOT DETECTED Final    Comment: (NOTE) The GeneXpert MRSA Assay (FDA approved for NASAL specimens only), is one component of a comprehensive MRSA colonization surveillance program. It is not intended to diagnose MRSA infection nor to guide or monitor treatment for MRSA infections. Test performance is not FDA approved in patients less than 62 years old. Performed at [facility], [address] [place], [place] [address]   KOH prep     Status: None   Collection Time: 06/05/24 10:00 AM   Specimen: Esophagus  Result Value Ref Range Status   Specimen Description ESOPHAGUS  Final   Special Requests Immunocompromised  Final   KOH Prep   Final    YEAST WITH PSEUDOHYPHAE RESULT CALLED TO, READ BACK BY AND VERIFIED WITH: NOTIFIED RACHEL VERDI, RN 1206 06/05/24 THAT TRICHOMONAS WAS ALSO SEEN ON SLIDE  GM Performed at [facility], [address] [place], [place] [address]    Report Status 06/05/2024 FINAL  Final  Gastrointestinal Panel by PCR , Stool     Status: None   Collection Time: 06/05/24  3:04 PM   Specimen: Stool  Result Value Ref Range Status   Campylobacter species NOT DETECTED NOT DETECTED Final   Plesimonas shigelloides NOT DETECTED NOT DETECTED Final   Salmonella species NOT DETECTED NOT DETECTED Final   Yersinia enterocolitica NOT DETECTED NOT DETECTED Final   Vibrio species NOT DETECTED NOT DETECTED Final   Vibrio cholerae NOT DETECTED NOT DETECTED Final   Enteroaggregative E coli (EAEC) NOT DETECTED NOT DETECTED Final    Enteropathogenic E coli (EPEC) NOT DETECTED NOT DETECTED Final   Enterotoxigenic E coli (ETEC) NOT DETECTED NOT DETECTED Final   Shiga like toxin producing E coli (STEC) NOT DETECTED NOT DETECTED Final   Shigella/Enteroinvasive E coli (EIEC) NOT DETECTED NOT DETECTED Final   Cryptosporidium NOT DETECTED NOT DETECTED Final   Cyclospora cayetanensis NOT DETECTED NOT DETECTED Final   Entamoeba histolytica NOT DETECTED NOT DETECTED Final   Giardia lamblia NOT DETECTED NOT DETECTED Final   Adenovirus F40/41 NOT DETECTED NOT DETECTED Final   Astrovirus NOT DETECTED NOT DETECTED Final   Norovirus GI/GII NOT DETECTED NOT DETECTED Final   Rotavirus A NOT DETECTED NOT DETECTED Final   Sapovirus (I, II, IV, and V) NOT DETECTED NOT DETECTED Final    Comment: Performed at [facility], [address] [place], [place] [address]    Coagulation Studies: No results for input(s): LABPROT, INR in the last 72 hours.    Urinalysis: No results for input(s): COLORURINE, LABSPEC, PHURINE, GLUCOSEU, HGBUR, BILIRUBINUR, KETONESUR, PROTEINUR, UROBILINOGEN, NITRITE, LEUKOCYTESUR in the last 72 hours.  Invalid input(s): APPERANCEUR     Imaging: DG Chest Port 1 View Result Date: 06/08/2024 EXAM: 1 VIEW(S) XRAY OF THE CHEST 06/08/2024 05:13:00 AM COMPARISON: 05/29/2024 CLINICAL HISTORY: Pneumonia FINDINGS: LINES, TUBES AND DEVICES: Right chest central venous catheter in place with tip terminating over the right atrium. Left IJ central venous catheter in place with tip terminating over the superior cavoatrial junction. LUNGS AND PLEURA: Low lung volumes. Small left pleural effusion. Left basilar homogeneous opacification, likely atelectasis. Likely right basilar atelectasis. No pulmonary edema. No pneumothorax. HEART AND MEDIASTINUM: Atherosclerotic calcifications. No acute abnormality of the cardiac and mediastinal silhouettes. BONES AND SOFT TISSUES: No acute osseous  abnormality. IMPRESSION: 1.  Small left pleural effusion with left basilar opacification, likely atelectasis. 2. Probable right basilar atelectasis. Electronically signed by: Waddell Calk MD 06/08/2024 05:43 AM EDT RP Workstation: [id number]        Medications:    metronidazole Stopped (06/07/24 2258)   norepinephrine  (LEVOPHED ) Adult infusion 4 mcg/min (06/08/24 0600)   vasopressin  0.03 Units/min (06/08/24 0600)     vitamin C   500 mg Oral BID   Chlorhexidine  Gluconate Cloth  6 each Topical Daily   epoetin  alfa-epbx (RETACRIT ) injection  10,000 Units Subcutaneous Q T,Th,Sat-1800   fluconazole  200 mg Oral QHS   magic mouthwash w/lidocaine   10 mL Oral TID   midodrine   20 mg Oral TID WC   multivitamin  1 tablet Oral QHS   pantoprazole  (PROTONIX ) IV  40 mg Intravenous Q12H   Ensure Max Protein  11 oz Oral BID   docusate sodium , mouth rinse, polyethylene glycol, zolpidem   Assessment/ Plan:  Kevin Gross is a 68 y.o.  male  with past medical conditions including hypertension, COPD, obstructive sleep apnea with CPAP, anemia, and chronic kidney disease stage IV, who was admitted to [facility] on 05/29/2024 for Septic shock (HCC) [A41.9, R65.21]  [facility] /TTS/RT permcath  Acute kidney injury on chronic kidney disease stage IV with Volume overload Baseline creatinine 2.77 with GFR 24 06/30/2024. CT abd/pelvis shows dense lesion on left kidney.  CRRT stopped 06/06/2024. Patient will be due for hemodialysis treatment today at the bedside as he is still requiring 2 pressors.     Lab Results  Component Value Date   CREATININE 3.88 (H) 06/08/2024   CREATININE 2.53 (H) 06/07/2024   CREATININE 1.97 (H) 06/06/2024    Intake/Output Summary (Last 24 hours) at 06/08/2024 0851 Last data filed at 06/08/2024 0600 Gross per 24 hour  Intake 485.09 ml  Output --  Net 485.09 ml   2. Hypotension likely due to shock secondary to liver cirrhosis.  Patient on both norepinephrine  and  vasopressin .  3. Anemia of chronic kidney disease Lab Results  Component Value Date   HGB 9.1 (L) 06/08/2024  Patient has received multiple blood products during this admission.  Continue Retacrit  10,000 units IV with dialysis treatments.  4.  Secondary Hyperparathyroidism: with outpatient labs: PTH 52.7, phosphorus 4.1, calcium  8.6 on 7/2.  Lab Results  Component Value Date   CALCIUM  8.4 (L) 06/08/2024   CAION 1.21 08/08/2023   PHOS 5.1 (H) 06/08/2024  Phosphorus a bit higher today at 5.1 but should come down with dialysis treatment.   LOS: 10 Alydia Gosser 10/11/20258:51 AM

## 2024-06-08 NOTE — Plan of Care (Signed)
  Problem: Health Behavior/Discharge Planning: Goal: Ability to manage health-related needs will improve 06/08/2024 1754 by Teressa Rouleau, RN Outcome: Not Progressing Note: Remains very weak, dependent or requiring assistance with all ADL's.    Problem: Clinical Measurements: Goal: Ability to maintain clinical measurements within normal limits will improve 06/08/2024 1754 by Teressa Rouleau, RN Outcome: Not Progressing WBC increasing. Remains on pressors.    Problem: Clinical Measurements: Goal: Will remain free from infection 06/08/2024 1754 by Teressa Rouleau, RN Outcome: Not Progressing WBC increasing. Afebrile.   Problem: Clinical Measurements: Goal: Cardiovascular complication will be avoided 06/08/2024 1800 by Teressa Rouleau, RN Outcome: Not Progressing 06/08/2024 1754 by Teressa Rouleau, RN Outcome: Not Progressing   Problem: Nutrition: Goal: Adequate nutrition will be maintained 06/08/2024 1800 by Teressa Rouleau, RN Outcome: Not Progressing 06/08/2024 1754 by Teressa Rouleau, RN Outcome: Not Progressing Refusing meals. Only takes few sips of Ensure.   Problem: Skin Integrity: Goal: Risk for impaired skin integrity will decrease 06/08/2024 1754 by Teressa Rouleau, RN Outcome: Progressing

## 2024-06-08 NOTE — Procedures (Signed)
    Paracentesis Procedure Note   Paracentesis Procedure Note [id number]   INDICATION: Tense ascites  PROCEDURE OPERATOR: Carrina Schoenberger MD  Ultrasound used to mark location: Yes    CONSENT:   Consent was obtained from wife prior to the procedure. Indications, risks, and benefits were explained at length.       PROCEDURE SUMMARY:   A time-out was performed. My hands were washed immediately prior to the procedure. I wore a surgical cap, mask with protective eyewear, sterile gown and sterile gloves throughout the procedure. The area was cleansed and draped in usual sterile fashion using chlorhexidine  scrub. Anesthesia was achieved with 1% lidocaine . The left of the abdomen was prepped and draped in a sterile fashion using chlorhexidine  scrub. 1% lidocaine  was used to numb the skin, soft tissue and peritoneum. The paracentesis catheter was inserted and advanced with negative pressure until straw colored fluid was aspirated. Approximately 60 mL of ascitic fluid was collected and sent for laboratory analysis. The catheter was then connected to the vaccutainer and 4.7liters of additional ascitic fluid were drained. The catheter was removed and no leaking was noted. A bandaid was placed over the puncture wound. The patient tolerated the procedure well without any immediate complications. Estimated blood loss was <81ml.    Media Information  Document Information  Straw colored specimen sent for cell count and diff, culture with gram stain and albumin  level    Media Information  Document Information  4.7L Straw colored fluid from peritoneal cavity    Kevin Gross, M.D.  Pulmonary & Critical Care Medicine  [facility]

## 2024-06-08 NOTE — Plan of Care (Signed)
  Problem: Clinical Measurements: Goal: Ability to maintain clinical measurements within normal limits will improve Outcome: Progressing Goal: Will remain free from infection Outcome: Progressing Goal: Diagnostic test results will improve Outcome: Progressing Goal: Respiratory complications will improve Outcome: Progressing Goal: Cardiovascular complication will be avoided Outcome: Progressing   Problem: Elimination: Goal: Will not experience complications related to urinary retention Outcome: Progressing   Problem: Safety: Goal: Ability to remain free from injury will improve Outcome: Progressing   Problem: Skin Integrity: Goal: Risk for impaired skin integrity will decrease Outcome: Progressing

## 2024-06-09 LAB — RENAL FUNCTION PANEL
Albumin: 2.7 g/dL — ABNORMAL LOW (ref 3.5–5.0)
Anion gap: 12 (ref 5–15)
BUN: 43 mg/dL — ABNORMAL HIGH (ref 8–23)
CO2: 26 mmol/L (ref 22–32)
Calcium: 8.4 mg/dL — ABNORMAL LOW (ref 8.9–10.3)
Chloride: 96 mmol/L — ABNORMAL LOW (ref 98–111)
Creatinine, Ser: 2.37 mg/dL — ABNORMAL HIGH (ref 0.61–1.24)
GFR, Estimated: 29 mL/min — ABNORMAL LOW (ref >60)
Glucose, Bld: 117 mg/dL — ABNORMAL HIGH (ref 70–99)
Phosphorus: 3.5 mg/dL (ref 2.5–4.6)
Potassium: 3.9 mmol/L (ref 3.5–5.1)
Sodium: 134 mmol/L — ABNORMAL LOW (ref 135–145)

## 2024-06-09 LAB — HEPATITIS PANEL, ACUTE
HCV Ab: NONREACTIVE
Hep A IgM: NONREACTIVE
Hep B C IgM: NONREACTIVE
Hepatitis B Surface Ag: NONREACTIVE

## 2024-06-09 LAB — CBC
HCT: 26.1 % — ABNORMAL LOW (ref 39.0–52.0)
Hemoglobin: 8.3 g/dL — ABNORMAL LOW (ref 13.0–17.0)
MCH: 31.8 pg (ref 26.0–34.0)
MCHC: 31.8 g/dL (ref 30.0–36.0)
MCV: 100 fL (ref 80.0–100.0)
Platelets: 81 K/uL — ABNORMAL LOW (ref 150–400)
RBC: 2.61 MIL/uL — ABNORMAL LOW (ref 4.22–5.81)
RDW: 27.6 % — ABNORMAL HIGH (ref 11.5–15.5)
WBC: 22 K/uL — ABNORMAL HIGH (ref 4.0–10.5)
nRBC: 0.7 % — ABNORMAL HIGH (ref 0.0–0.2)

## 2024-06-09 LAB — MAGNESIUM: Magnesium: 2.1 mg/dL (ref 1.7–2.4)

## 2024-06-09 MED ORDER — FUROSEMIDE 10 MG/ML IJ SOLN: 20.0000 mg | Freq: Once | INTRAMUSCULAR | Status: DC

## 2024-06-09 MED ORDER — ALBUMIN HUMAN 25 % IV SOLN
25.0000 g | Freq: Two times a day (BID) | INTRAVENOUS | Status: AC
  Administered 2024-06-09 (×2): 25 g via INTRAVENOUS
  Filled 2024-06-09 (×2): qty 100

## 2024-06-09 NOTE — Plan of Care (Signed)
  Problem: Nutrition: Goal: Adequate nutrition will be maintained Outcome: Progressing   Problem: Coping: Goal: Level of anxiety will decrease Outcome: Progressing   Problem: Pain Managment: Goal: General experience of comfort will improve and/or be controlled Outcome: Progressing   Problem: Safety: Goal: Ability to remain free from injury will improve Outcome: Progressing

## 2024-06-09 NOTE — Plan of Care (Signed)
  Problem: Education: Goal: Knowledge of General Education information will improve Description: Including pain rating scale, medication(s)/side effects and non-pharmacologic comfort measures Outcome: Progressing   Problem: Health Behavior/Discharge Planning: Goal: Ability to manage health-related needs will improve Outcome: Progressing   Problem: Clinical Measurements: Goal: Ability to maintain clinical measurements within normal limits will improve Outcome: Progressing Goal: Diagnostic test results will improve Outcome: Progressing Goal: Respiratory complications will improve Outcome: Progressing Goal: Cardiovascular complication will be avoided Outcome: Progressing   Problem: Nutrition: Goal: Adequate nutrition will be maintained Outcome: Progressing   Problem: Elimination: Goal: Will not experience complications related to bowel motility Outcome: Progressing

## 2024-06-09 NOTE — Progress Notes (Signed)
 [facility]  ROUNDING NOTE   Subjective:   Kevin Gross  is a 68 y.o.  male  with past medical conditions including hypertension, COPD, obstructive sleep apnea with CPAP, anemia, and recent acute kidney injury requiring hemodialysis. Patient presents to ED with decreased blood pressure and has been admitted to ICU for Septic shock (HCC) [A41.9, R65.21]  Patient seen and evaluated at bedside. Alert and oriented.   Update: Patient underwent dialysis treatment early this a.m. Feeling better today. Does not have any abdominal pain at the moment.   Objective:  Vital signs in last 24 hours:  Temp:  [97.5 F (36.4 C)-98.5 F (36.9 C)] 98.5 F (36.9 C) (10/12 0400) Pulse Rate:  [51-78] 68 (10/12 0730) Resp:  [10-19] 13 (10/12 0730) BP: (79-136)/(33-113) 109/51 (10/12 0730) SpO2:  [93 %-100 %] 98 % (10/12 0730) Weight:  [99.4 kg] 99.4 kg (10/12 0500)  Weight change: -7.9 kg Filed Weights   06/07/24 0432 06/08/24 0433 06/09/24 0500  Weight: 107.1 kg 107.3 kg 99.4 kg    Intake/Output: I/O last 3 completed shifts: In: 1122.9 [P.O.:160; I.V.:508.6; IV Piggyback:454.3] Out: 5900 [Other:5900]   Intake/Output this shift:  No intake/output data recorded.  Physical Exam: General: NAD  Head: Normocephalic, atraumatic. Moist oral mucosal membranes  Eyes: Anicteric  Lungs:  Clear to auscultation, normal effort  Heart: Regular rate and rhythm  Abdomen:  Soft, nontender  Extremities: 2+ peripheral edema  Neurologic: Arousable  Skin: Generalized ecchymosis  Access: Right IJ PermCath    Basic Metabolic Panel: Recent Labs  Lab 06/05/24 0520 06/05/24 1602 06/06/24 0446 06/06/24 1618 06/07/24 0416 06/08/24 0417 06/09/24 0509  NA 135   < > 132* 134* 133* 134* 134*  K 4.0   < > 4.3 4.3 4.4 5.0 3.9  CL 100   < > 99 101 102 99 96*  CO2 24   < > 23 24 23  21* 26  GLUCOSE 130*   < > 124* 147* 122* 110* 117*  BUN 37*   < > 39* 41* 51* 76* 43*  CREATININE 1.93*   < >  1.84* 1.97* 2.53* 3.88* 2.37*  CALCIUM  8.3*   < > 8.0* 8.2* 8.4* 8.4* 8.4*  MG 2.6*  --  2.8*  --  2.7* 3.0* 2.1  PHOS 3.3   < > 3.7 3.6 4.6 5.1* 3.5   < > = values in this interval not displayed.    Liver Function Tests: Recent Labs  Lab 06/06/24 0446 06/06/24 1618 06/07/24 0416 06/08/24 0417 06/09/24 0509  ALBUMIN  3.0* 2.7* 2.7* 2.3* 2.7*   No results for input(s): LIPASE, AMYLASE in the last 168 hours. No results for input(s): AMMONIA in the last 168 hours.   CBC: Recent Labs  Lab 06/05/24 0520 06/05/24 1230 06/06/24 0446 06/06/24 1230 06/06/24 1825 06/07/24 0416 06/08/24 0417 06/09/24 0509  WBC 25.1*  --  20.0*  --   --  20.3* 29.0* 22.0*  HGB 7.9*   < > 7.7* 7.6* 8.4* 8.3* 9.1* 8.3*  HCT 25.0*   < > 24.3* 24.8* 26.7* 26.8* 28.9* 26.1*  MCV 102.5*  --  103.0*  --   --  104.3* 103.2* 100.0  PLT 89*  --  79*  --   --  80* 115* 81*   < > = values in this interval not displayed.    Cardiac Enzymes: No results for input(s): CKTOTAL, CKMB, CKMBINDEX, TROPONINI in the last 168 hours.  BNP: Invalid input(s): POCBNP  CBG: No results  for input(s): GLUCAP in the last 168 hours.    Microbiology: Results for orders placed or performed during the hospital encounter of 05/29/24  Resp panel by RT-PCR (RSV, Flu A&B, Covid) Anterior Nasal Swab     Status: None   Collection Time: 05/29/24 12:56 PM   Specimen: Anterior Nasal Swab  Result Value Ref Range Status   SARS Coronavirus 2 by RT PCR NEGATIVE NEGATIVE Final    Comment: (NOTE) SARS-CoV-2 target nucleic acids are NOT DETECTED.  The SARS-CoV-2 RNA is generally detectable in upper respiratory specimens during the acute phase of infection. The lowest concentration of SARS-CoV-2 viral copies this assay can detect is 138 copies/mL. A negative result does not preclude SARS-Cov-2 infection and should not be used as the sole basis for treatment or other patient management decisions. A negative result  may occur with  improper specimen collection/handling, submission of specimen other than nasopharyngeal swab, presence of viral mutation(s) within the areas targeted by this assay, and inadequate number of viral copies(<138 copies/mL). A negative result must be combined with clinical observations, patient history, and epidemiological information. The expected result is Negative.  Fact Sheet for Patients:  BloggerCourse.com  Fact Sheet for Healthcare Providers:  SeriousBroker.it  This test is no t yet approved or cleared by the [place]  FDA and  has been authorized for detection and/or diagnosis of SARS-CoV-2 by FDA under an Emergency Use Authorization (EUA). This EUA will remain  in effect (meaning this test can be used) for the duration of the COVID-19 declaration under Section 564(b)(1) of the Act, 21 U.S.C.section 360bbb-3(b)(1), unless the authorization is terminated  or revoked sooner.       Influenza A by PCR NEGATIVE NEGATIVE Final   Influenza B by PCR NEGATIVE NEGATIVE Final    Comment: (NOTE) The Xpert Xpress SARS-CoV-2/FLU/RSV plus assay is intended as an aid in the diagnosis of influenza from Nasopharyngeal swab specimens and should not be used as a sole basis for treatment. Nasal washings and aspirates are unacceptable for Xpert Xpress SARS-CoV-2/FLU/RSV testing.  Fact Sheet for Patients: BloggerCourse.com  Fact Sheet for Healthcare Providers: SeriousBroker.it  This test is not yet approved or cleared by the [place]  FDA and has been authorized for detection and/or diagnosis of SARS-CoV-2 by FDA under an Emergency Use Authorization (EUA). This EUA will remain in effect (meaning this test can be used) for the duration of the COVID-19 declaration under Section 564(b)(1) of the Act, 21 U.S.C. section 360bbb-3(b)(1), unless the authorization is terminated  or revoked.     Resp Syncytial Virus by PCR NEGATIVE NEGATIVE Final    Comment: (NOTE) Fact Sheet for Patients: BloggerCourse.com  Fact Sheet for Healthcare Providers: SeriousBroker.it  This test is not yet approved or cleared by the [place]  FDA and has been authorized for detection and/or diagnosis of SARS-CoV-2 by FDA under an Emergency Use Authorization (EUA). This EUA will remain in effect (meaning this test can be used) for the duration of the COVID-19 declaration under Section 564(b)(1) of the Act, 21 U.S.C. section 360bbb-3(b)(1), unless the authorization is terminated or revoked.  Performed at [facility], [address] [place], [place] [address]   Blood culture (routine x 2)     Status: None   Collection Time: 05/29/24 12:56 PM   Specimen: BLOOD  Result Value Ref Range Status   Specimen Description BLOOD RIGHT ANTECUBITAL  Final   Special Requests   Final    BOTTLES DRAWN AEROBIC AND ANAEROBIC Blood Culture results may  not be optimal due to an inadequate volume of blood received in culture bottles   Culture   Final    NO GROWTH 5 DAYS Performed at [facility], [address] [place], [place] [address]    Report Status 06/03/2024 FINAL  Final  Blood culture (routine x 2)     Status: None   Collection Time: 05/29/24 12:56 PM   Specimen: BLOOD  Result Value Ref Range Status   Specimen Description BLOOD LEFT ANTECUBITAL  Final   Special Requests   Final    BOTTLES DRAWN AEROBIC AND ANAEROBIC Blood Culture results may not be optimal due to an inadequate volume of blood received in culture bottles   Culture   Final    NO GROWTH 5 DAYS Performed at [facility], [address] [place], [place] [address]    Report Status 06/03/2024 FINAL  Final  MRSA Next Gen by PCR, Nasal     Status: None   Collection Time: 05/29/24  2:58 PM   Specimen: Nasal Mucosa; Nasal Swab  Result Value  Ref Range Status   MRSA by PCR Next Gen NOT DETECTED NOT DETECTED Final    Comment: (NOTE) The GeneXpert MRSA Assay (FDA approved for NASAL specimens only), is one component of a comprehensive MRSA colonization surveillance program. It is not intended to diagnose MRSA infection nor to guide or monitor treatment for MRSA infections. Test performance is not FDA approved in patients less than 52 years old. Performed at [facility], [address] [place], [place] [address]   Group A Strep by PCR     Status: None   Collection Time: 05/29/24  2:58 PM   Specimen: Ascities; Sterile Swab  Result Value Ref Range Status   Group A Strep by PCR NOT DETECTED NOT DETECTED Final    Comment: Performed at [facility], [address] [place], [place] [address]  Respiratory (~20 pathogens) panel by PCR     Status: None   Collection Time: 05/29/24  3:00 PM  Result Value Ref Range Status   Adenovirus NOT DETECTED NOT DETECTED Final   Coronavirus 229E NOT DETECTED NOT DETECTED Final    Comment: (NOTE) The Coronavirus on the Respiratory Panel, DOES NOT test for the novel  Coronavirus (2019 nCoV)    Coronavirus HKU1 NOT DETECTED NOT DETECTED Final   Coronavirus NL63 NOT DETECTED NOT DETECTED Final   Coronavirus OC43 NOT DETECTED NOT DETECTED Final   Metapneumovirus NOT DETECTED NOT DETECTED Final   Rhinovirus / Enterovirus NOT DETECTED NOT DETECTED Final   Influenza A NOT DETECTED NOT DETECTED Final   Influenza B NOT DETECTED NOT DETECTED Final   Parainfluenza Virus 1 NOT DETECTED NOT DETECTED Final   Parainfluenza Virus 2 NOT DETECTED NOT DETECTED Final   Parainfluenza Virus 3 NOT DETECTED NOT DETECTED Final   Parainfluenza Virus 4 NOT DETECTED NOT DETECTED Final   Respiratory Syncytial Virus NOT DETECTED NOT DETECTED Final   Bordetella pertussis NOT DETECTED NOT DETECTED Final   Bordetella Parapertussis NOT DETECTED NOT DETECTED Final   Chlamydophila pneumoniae NOT DETECTED  NOT DETECTED Final   Mycoplasma pneumoniae NOT DETECTED NOT DETECTED Final    Comment: Performed at [facility], [address], [place], [place] [address]  MRSA Next Gen by PCR, Nasal     Status: None   Collection Time: 05/29/24  3:50 PM   Specimen: Nasal Mucosa; Nasal Swab  Result Value Ref Range Status   MRSA by PCR  Next Gen NOT DETECTED NOT DETECTED Final    Comment: (NOTE) The GeneXpert MRSA Assay (FDA approved for NASAL specimens only), is one component of a comprehensive MRSA colonization surveillance program. It is not intended to diagnose MRSA infection nor to guide or monitor treatment for MRSA infections. Test performance is not FDA approved in patients less than 4 years old. Performed at [facility], [address] [place], [place] [address]   KOH prep     Status: None   Collection Time: 06/05/24 10:00 AM   Specimen: Esophagus  Result Value Ref Range Status   Specimen Description ESOPHAGUS  Final   Special Requests Immunocompromised  Final   KOH Prep   Final    YEAST WITH PSEUDOHYPHAE RESULT CALLED TO, READ BACK BY AND VERIFIED WITH: NOTIFIED RACHEL VERDI, RN [id number] 06/05/24 THAT TRICHOMONAS WAS ALSO SEEN ON SLIDE  GM Performed at [facility], [address] [place], [place] [address]    Report Status 06/05/2024 FINAL  Final  Gastrointestinal Panel by PCR , Stool     Status: None   Collection Time: 06/05/24  3:04 PM   Specimen: Stool  Result Value Ref Range Status   Campylobacter species NOT DETECTED NOT DETECTED Final   Plesimonas shigelloides NOT DETECTED NOT DETECTED Final   Salmonella species NOT DETECTED NOT DETECTED Final   Yersinia enterocolitica NOT DETECTED NOT DETECTED Final   Vibrio species NOT DETECTED NOT DETECTED Final   Vibrio cholerae NOT DETECTED NOT DETECTED Final   Enteroaggregative E coli (EAEC) NOT DETECTED NOT DETECTED Final   Enteropathogenic E coli (EPEC) NOT DETECTED NOT DETECTED Final   Enterotoxigenic E coli  (ETEC) NOT DETECTED NOT DETECTED Final   Shiga like toxin producing E coli (STEC) NOT DETECTED NOT DETECTED Final   Shigella/Enteroinvasive E coli (EIEC) NOT DETECTED NOT DETECTED Final   Cryptosporidium NOT DETECTED NOT DETECTED Final   Cyclospora cayetanensis NOT DETECTED NOT DETECTED Final   Entamoeba histolytica NOT DETECTED NOT DETECTED Final   Giardia lamblia NOT DETECTED NOT DETECTED Final   Adenovirus F40/41 NOT DETECTED NOT DETECTED Final   Astrovirus NOT DETECTED NOT DETECTED Final   Norovirus GI/GII NOT DETECTED NOT DETECTED Final   Rotavirus A NOT DETECTED NOT DETECTED Final   Sapovirus (I, II, IV, and V) NOT DETECTED NOT DETECTED Final    Comment: Performed at [facility], [address] [place], [place] [address]  Body fluid culture w Gram Stain     Status: None (Preliminary result)   Collection Time: 06/08/24  1:57 PM   Specimen: Peritoneal Washings; Body Fluid  Result Value Ref Range Status   Specimen Description   Final    PERITONEAL Performed at [facility], [address], [place], [place] [address]    Special Requests   Final    NONE Performed at [facility], [address] [place], [place] [address]    Gram Stain   Final    NO WBC SEEN NO ORGANISMS SEEN Performed at [facility], [address] [place], [place] [address]    Culture PENDING  Incomplete   Report Status PENDING  Incomplete    Coagulation Studies: Recent Labs    06/08/24 1053  LABPROT 21.2*  INR 1.7*      Urinalysis: No results for input(s): COLORURINE, LABSPEC, PHURINE, GLUCOSEU, HGBUR, BILIRUBINUR, KETONESUR, PROTEINUR, UROBILINOGEN, NITRITE, LEUKOCYTESUR in the last 72 hours.  Invalid input(s): APPERANCEUR     Imaging: DG Chest Port 1  View Result Date: 06/08/2024 EXAM: 1 VIEW(S) XRAY OF THE CHEST 06/08/2024 05:13:00 AM COMPARISON: 05/29/2024 CLINICAL HISTORY: Pneumonia FINDINGS: LINES, TUBES AND DEVICES: Right  chest central venous catheter in place with tip terminating over the right atrium. Left IJ central venous catheter in place with tip terminating over the superior cavoatrial junction. LUNGS AND PLEURA: Low lung volumes. Small left pleural effusion. Left basilar homogeneous opacification, likely atelectasis. Likely right basilar atelectasis. No pulmonary edema. No pneumothorax. HEART AND MEDIASTINUM: Atherosclerotic calcifications. No acute abnormality of the cardiac and mediastinal silhouettes. BONES AND SOFT TISSUES: No acute osseous abnormality. IMPRESSION: 1. Small left pleural effusion with left basilar opacification, likely atelectasis. 2. Probable right basilar atelectasis. Electronically signed by: Waddell Calk MD 06/08/2024 05:43 AM EDT RP Workstation: [id number]        Medications:    metronidazole Stopped (06/09/24 0155)   norepinephrine  (LEVOPHED ) Adult infusion 4 mcg/min (06/09/24 0600)   vasopressin  0.04 Units/min (06/09/24 0624)     vitamin C   500 mg Oral BID   Chlorhexidine  Gluconate Cloth  6 each Topical Daily   epoetin  alfa-epbx (RETACRIT ) injection  10,000 Units Subcutaneous Q T,Th,Sat-1800   fluconazole  200 mg Oral QHS   magic mouthwash w/lidocaine   10 mL Oral TID   midodrine   20 mg Oral TID WC   multivitamin  1 tablet Oral QHS   pantoprazole  (PROTONIX ) IV  40 mg Intravenous Q12H   Ensure Max Protein  11 oz Oral BID   docusate sodium , mouth rinse, polyethylene glycol, zolpidem   Assessment/ Plan:  Mr. XAVYER STEENSON is a 68 y.o.  male  with past medical conditions including hypertension, COPD, obstructive sleep apnea with CPAP, anemia, and chronic kidney disease stage IV, who was admitted to [facility] on 05/29/2024 for Septic shock (HCC) [A41.9, R65.21]  [facility]/TTS/RT permcath  Acute kidney injury on chronic kidney disease stage IV with Volume overload Baseline creatinine 2.77 with GFR 24 06/30/2024. CT abd/pelvis shows dense lesion on left kidney.  CRRT  stopped 06/06/2024. Patient underwent hemodialysis treatment early this a.m.  Tolerated well.  Creatinine down to 2.3.     Lab Results  Component Value Date   CREATININE 2.37 (H) 06/09/2024   CREATININE 3.88 (H) 06/08/2024   CREATININE 2.53 (H) 06/07/2024    Intake/Output Summary (Last 24 hours) at 06/09/2024 0858 Last data filed at 06/09/2024 0600 Gross per 24 hour  Intake 870.32 ml  Output 5900 ml  Net -5029.68 ml   2. Hypotension likely due to shock secondary to liver cirrhosis.  Still on both norepinephrine  and vasopressin .  These may need to be titrated during dialysis treatments.  3. Anemia of chronic kidney disease Lab Results  Component Value Date   HGB 8.3 (L) 06/09/2024  Patient has received multiple blood products during this admission.  Continue Retacrit  10,000 units IV with dialysis treatments.  4.  Secondary Hyperparathyroidism: with outpatient labs: PTH 52.7, phosphorus 4.1, calcium  8.6 on 7/2.  Lab Results  Component Value Date   CALCIUM  8.4 (L) 06/09/2024   CAION 1.21 08/08/2023   PHOS 3.5 06/09/2024  Phosphorus down to 3.5 and acceptable.   LOS: 11 Latoia Eyster 10/12/20258:58 AM

## 2024-06-09 NOTE — Progress Notes (Signed)
   06/09/24 0030  Vitals  BP (!) 96/52  MAP (mmHg) 66  Pulse Rate 76  ECG Heart Rate 76  Resp 15  Oxygen Therapy  SpO2 100 %  During Treatment Monitoring  Blood Flow Rate (mL/min) 0 mL/min  Arterial Pressure (mmHg) -17.98 mmHg  Venous Pressure (mmHg) -2.22 mmHg  TMP (mmHg) 12.52 mmHg  Ultrafiltration Rate (mL/min) 615 mL/min  Dialysate Flow Rate (mL/min) 299 ml/min  Dialysate Potassium Concentration 2  Dialysate Calcium  Concentration 2.5  Duration of HD Treatment -hour(s) 3.5 hour(s)  Cumulative Fluid Removed (mL) per Treatment  1100.15

## 2024-06-09 NOTE — Progress Notes (Signed)
 NAME:  Kevin Gross, MRN:  [id number], DOB:  1956-07-06, LOS: 11 ADMISSION DATE:  05/29/2024, CONSULTATION DATE:  05/29/2024 REFERRING MD:  Dr. Claudene, CHIEF COMPLAINT:  Generalized Weakness & Hypotension    Brief Pt Description / Synopsis:  68 y.o. male with PMHx significant for ESRD on HD, HFpEF, COPD, OSA, Alcoholic Cirrhosis, and portal vein thrombosis, who is admitted with Multifactorial shock (hypovolemic/hemorrhagic vs septic), Sepsis of unknown etiology (questionable LLE cellulitis vs Pneumonia vs SBP), Acute GI Bleed, and Acute Decompensated Cirrhosis.   History of Present Illness:  Kevin Gross is a 68 y.o. male who presented to [facility] ED via EMS from his home to be evaluated for hypotension and weakness. He reports his initial outpatient hemodialysis appointment was 05/28/2024 at [facility]. This initial appointment is following a recent admission at [facility], of which he was discharged on 05/24/24.   His overall weakness began yesterday following his dialysis appointment and has continued today, with the addition of upper URI symptoms to include minor congestion and a sore throat as of this morning. Upon EMS arrival, he was found to have a BP of 64/28, to which he received fluids and BP increased to 94/42. He reported a sore throat and generalized fatigue/weakness that resulted in a fall this morning. He also stated at baseline he has no issue ambulating with a walker.  06/04/24-patient resting in bed comfortably. He remains hypotensive on vasopressors and dialysis.   06/05/24- patient went for EGD today. I discussed findings with GI Dr Therisa and there is trichomonal infection of pharynx and esophagus which may be seen with severe immunocompromised patients.  He is communicative.  He has oral mucosal sloughing and oral thrush as well.    06/06/24- patient on levophed  low dose, able to tolerate clear diet.  Has R internal jugular and subclavian central line. Severe thrush on EGD.  Trichomonas in  throat and is being treated with ID specialist.   06/07/24- Patient is resting in bed in NAD.  Renal function with decrement in GFR overnight. Being treated for thrichomonal throat infection and thrush. Remains on vasopressor with PO midodrine  20mg  TID.  On zosyn , diflucan and flagyl.  No family has been with patient all week thus far.   06/08/24- patient back on levophed  and vasopressin  infusion due to worsening shock physiology. He has large abd ascites and we may need to perform abd paracentesis today.  There is concern for abd compartment.  06/09/24- patient s/p paracentesis , no SBP.  Remains on vasopressors.  MELD 33. S/p rrt.  Pertinent  Medical History  ETOH Hypertensin Hyperlipidemia Congestive Heart Failure End Stage Renal Disease  Micro Data:  05/29/24: Blood cultures x2 >> no growth to date  05/29/24: MRSA PCR >> negative 05/29/24: Group A Strep PCR >> negative 05/29/24: COVID/FLU/RSV PCR >> negative 05/29/24: Legionella Urine Antigen >> 05/29/24: Strep Pneumo Urine Antigen >> 05/30/24: Viral Respiratory Panel >> negative   Antimicrobials:   Anti-infectives (From admission, onward)    Start     Dose/Rate Route Frequency Ordered Stop   06/07/24 2200  fluconazole (DIFLUCAN) tablet 200 mg        200 mg Oral Daily at bedtime 06/06/24 1223     06/06/24 1600  fluconazole (DIFLUCAN) IVPB 400 mg  Status:  Discontinued       Placed in And Linked Group   400 mg 100 mL/hr over 120 Minutes Intravenous Every 24 hours 06/05/24 1617 06/06/24 1145   06/06/24 1400  fluconazole (DIFLUCAN) IVPB 200  mg  Status:  Discontinued        200 mg 100 mL/hr over 60 Minutes Intravenous Every 24 hours 06/05/24 1558 06/05/24 1606   06/06/24 1400  fluconazole (DIFLUCAN) IVPB 400 mg  Status:  Discontinued       Placed in And Linked Group   400 mg 100 mL/hr over 120 Minutes Intravenous Every 24 hours 06/05/24 1617 06/06/24 1145   06/06/24 1400  fluconazole (DIFLUCAN) IVPB 400 mg        400 mg 100  mL/hr over 120 Minutes Intravenous  Once 06/06/24 1145 06/06/24 1543   06/05/24 2000  fluconazole (DIFLUCAN) IVPB 400 mg  Status:  Discontinued       Placed in And Linked Group   400 mg 100 mL/hr over 120 Minutes Intravenous Every 24 hours 06/05/24 1606 06/05/24 1617   06/05/24 1800  fluconazole (DIFLUCAN) IVPB 400 mg  Status:  Discontinued        400 mg 100 mL/hr over 120 Minutes Intravenous  Once 06/05/24 1558 06/05/24 1606   06/05/24 1800  fluconazole (DIFLUCAN) IVPB 400 mg  Status:  Discontinued       Placed in And Linked Group   400 mg 100 mL/hr over 120 Minutes Intravenous Every 24 hours 06/05/24 1606 06/05/24 1617   06/05/24 1800  fluconazole (DIFLUCAN) IVPB 400 mg        400 mg 100 mL/hr over 120 Minutes Intravenous  Once 06/05/24 1617 06/05/24 2009   06/05/24 1700  metroNIDAZOLE (FLAGYL) IVPB 500 mg        500 mg 100 mL/hr over 60 Minutes Intravenous Every 12 hours 06/05/24 1558     06/05/24 1430  fluconazole (DIFLUCAN) tablet 400 mg  Status:  Discontinued        400 mg Oral Daily 06/05/24 1343 06/05/24 1353   05/31/24 1800  piperacillin -tazobactam (ZOSYN ) IVPB 3.375 g  Status:  Discontinued        3.375 g 12.5 mL/hr over 240 Minutes Intravenous Every 6 hours 05/31/24 1300 05/31/24 1305   05/31/24 1400  piperacillin -tazobactam (ZOSYN ) IVPB 3.375 g        3.375 g 100 mL/hr over 30 Minutes Intravenous Every 6 hours 05/31/24 1306 06/06/24 1849   05/29/24 2200  piperacillin -tazobactam (ZOSYN ) IVPB 2.25 g  Status:  Discontinued        2.25 g 100 mL/hr over 30 Minutes Intravenous Every 8 hours 05/29/24 1752 05/31/24 1300   05/29/24 1400  piperacillin -tazobactam (ZOSYN ) IVPB 3.375 g        3.375 g 100 mL/hr over 30 Minutes Intravenous  Once 05/29/24 1353 05/29/24 1432   05/29/24 1400  vancomycin  (VANCOREADY) IVPB 2000 mg/400 mL        2,000 mg 200 mL/hr over 120 Minutes Intravenous  Once 05/29/24 1353 05/29/24 1600       Significant Hospital Events: Including procedures,  antibiotic start and stop dates in addition to other pertinent events   05/29/24: Started on Levophed  in the ED following refractory hypotension. Midodrine  10mg  x 1. 1.5L fluids given without improvement. Transferred to the ICU for further management. 2L [place] started with improvement in O2.  05/30/24: Remains on levophed  and started on vaso overnight. Minimal oxygen requirements. Oliguric with plan for HD today at 11.  Abdominal Arterial Ultrasound with patent portal vein and hepatopetal flow (PORTAL VEIN THROMBOSIS RESOLVED), notes cirrhosis and moderate to large ascites.  05/31/24: No significant events overnight, nursing noted several small episodes of melena.  Hgb down to 6.9 this am,  giving 1 unit pRBCs.  Remains on Levophed  and Vasopressin , start stress dose steroids and give Albumin .  Ultrasound yesterday negative for portal vein thrombosis, will give Vitamin K  for supratherapeutic INR.  Nephrology starting CRRT.  10/4 shock and on pressors 10/5 remains on pressors and CRRT  Interim History / Subjective:  Remains critically ill liver cirrhosis and chronic kidney disease   Objective   Blood pressure (!) 109/51, pulse 68, temperature 98.5 F (36.9 C), temperature source Oral, resp. rate 13, height 5' 9 (1.753 m), weight 99.4 kg, SpO2 98%. CVP:  [6 mmHg-9 mmHg] 9 mmHg      Intake/Output Summary (Last 24 hours) at 06/09/2024 0746 Last data filed at 06/09/2024 0600 Gross per 24 hour  Intake 870.32 ml  Output 5900 ml  Net -5029.68 ml   Filed Weights   06/07/24 0432 06/08/24 0433 06/09/24 0500  Weight: 107.1 kg 107.3 kg 99.4 kg     Physical Examination:   General Appearance: No distress  EYES PERRLA, EOM intact.  Icteric sclera NECK Supple, No JVD Pulmonary: normal breath sounds, No wheezing.  CardiovascularNormal S1,S2.  No m/r/g.   Abdomen: Benign, Soft, non-tender. Neurology UE/LE 5/5 strength, no focal deficits Ext pulses intact, cap refill intact +edema ALL OTHER ROS ARE  NEGATIVE     Assessment & Plan:  68 yo morbidly obese male admitted for decompensated liver cirrhosis and failure with progressive end stage kidney failure With severe shock hypovolemic/Hemorrhagic vs Septic exacerbated by Decompensated Liver Cirrhosis    Oral thrush - s/p ID eval on diflucan  Thrichomonal throat infection - on flagyl Moderate protein calorie malnutrition - refeeding risk elevated  AKI on CKD KDIGO 4 - nephrology on case s/p RRT Right lower lobe pneumonia - on zosyn  CAP? - on zosyn   Chronic Diastolic CHF Electorlyte derrangements  Acute blood loss anemia - s/p EGD  Alcoholic liver cirrhosis- minimize non essential hepatotoxins Bilateral pleural effusions - diurese as able , consider thoracentesis  Chronic emphysema -neb therapy prn 13. Large vol ascites - s/p paracentesis - no sbp 14. GI/dvt ppx            OVERALL POOR PROGNOSIS PATIENT WITH MULTIORGAN FAILURE   Best Practice (right click and Reselect all SmartList Selections daily)   Diet/type: Regular consistency (see orders) DVT prophylaxis: SCD GI prophylaxis: PPI Lines: Central line, Dialysis Catheter, and yes and it is still needed Foley:  Yes, and it is still needed Code Status:  full code DAILY multidisciplinary goals of care      Labs   CBC: Recent Labs  Lab 06/05/24 0520 06/05/24 1230 06/06/24 0446 06/06/24 1230 06/06/24 1825 06/07/24 0416 06/08/24 0417 06/09/24 0509  WBC 25.1*  --  20.0*  --   --  20.3* 29.0* 22.0*  HGB 7.9*   < > 7.7* 7.6* 8.4* 8.3* 9.1* 8.3*  HCT 25.0*   < > 24.3* 24.8* 26.7* 26.8* 28.9* 26.1*  MCV 102.5*  --  103.0*  --   --  104.3* 103.2* 100.0  PLT 89*  --  79*  --   --  80* 115* 81*   < > = values in this interval not displayed.    Basic Metabolic Panel: Recent Labs  Lab 06/05/24 0520 06/05/24 1602 06/06/24 0446 06/06/24 1618 06/07/24 0416 06/08/24 0417 06/09/24 0509  NA 135   < > 132* 134* 133* 134* 134*  K 4.0   < > 4.3 4.3 4.4 5.0 3.9  CL  100   < > 99  101 102 99 96*  CO2 24   < > 23 24 23  21* 26  GLUCOSE 130*   < > 124* 147* 122* 110* 117*  BUN 37*   < > 39* 41* 51* 76* 43*  CREATININE 1.93*   < > 1.84* 1.97* 2.53* 3.88* 2.37*  CALCIUM  8.3*   < > 8.0* 8.2* 8.4* 8.4* 8.4*  MG 2.6*  --  2.8*  --  2.7* 3.0* 2.1  PHOS 3.3   < > 3.7 3.6 4.6 5.1* 3.5   < > = values in this interval not displayed.   GFR: Estimated Creatinine Clearance: 35.6 mL/min (A) (by C-G formula based on SCr of 2.37 mg/dL (H)). Recent Labs  Lab 06/06/24 0446 06/07/24 0416 06/07/24 1357 06/08/24 0417 06/09/24 0509  WBC 20.0* 20.3*  --  29.0* 22.0*  LATICACIDVEN  --   --  1.8  --   --     Liver Function Tests: Recent Labs  Lab 06/06/24 0446 06/06/24 1618 06/07/24 0416 06/08/24 0417 06/09/24 0509  ALBUMIN  3.0* 2.7* 2.7* 2.3* 2.7*   No results for input(s): LIPASE, AMYLASE in the last 168 hours. No results for input(s): AMMONIA in the last 168 hours.  ABG    Component Value Date/Time   PHART 7.298 (L) 11/27/2021 0345   PCO2ART 39.7 11/27/2021 0345   PO2ART 116 (H) 11/27/2021 0345   HCO3 21.9 05/29/2024 2040   TCO2 21 (L) 08/08/2023 0916   ACIDBASEDEF 2.8 (H) 05/29/2024 2040   O2SAT 73.5 06/01/2024 1125     Coagulation Profile: Recent Labs  Lab 06/03/24 0500 06/04/24 0517 06/05/24 0520 06/08/24 1053  INR 2.0* 1.7* 1.7* 1.7*    Cardiac Enzymes: No results for input(s): CKTOTAL, CKMB, CKMBINDEX, TROPONINI in the last 168 hours.  HbA1C: Hgb A1c MFr Bld  Date/Time Value Ref Range Status  11/27/2021 03:28 AM 5.6 4.8 - 5.6 % Final    Comment:    (NOTE) Pre diabetes:          5.7%-6.4%  Diabetes:              >6.4%  Glycemic control for   <7.0% adults with diabetes     CBG: No results for input(s): GLUCAP in the last 168 hours.     DVT/GI PRX  assessed I Assessed the need for Labs I Assessed the need for Foley I Assessed the need for Central Venous Line Family Discussion when available I Assessed  the need for Mobilization I made an Assessment of medications to be adjusted accordingly Safety Risk assessment completed   Critical care provider statement:   Total critical care time: 32 minutes   Performed by: Parris MD   Critical care time was exclusive of separately billable procedures and treating other patients.   Critical care was necessary to treat or prevent imminent or life-threatening deterioration.   Critical care was time spent personally by me on the following activities: development of treatment plan with patient and/or surrogate as well as nursing, discussions with consultants, evaluation of patient's response to treatment, examination of patient, obtaining history from patient or surrogate, ordering and performing treatments and interventions, ordering and review of laboratory studies, ordering and review of radiographic studies, pulse oximetry and re-evaluation of patient's condition.    Audria Takeshita, M.D.  Pulmonary & Critical Care Medicine

## 2024-06-10 DIAGNOSIS — K7210 Chronic hepatic failure without coma: Secondary | ICD-10-CM

## 2024-06-10 DIAGNOSIS — A419 Sepsis, unspecified organism: Principal | ICD-10-CM

## 2024-06-10 DIAGNOSIS — R6521 Severe sepsis with septic shock: Secondary | ICD-10-CM

## 2024-06-10 DIAGNOSIS — Z7189 Other specified counseling: Secondary | ICD-10-CM | POA: Diagnosis not present

## 2024-06-10 DIAGNOSIS — Z789 Other specified health status: Secondary | ICD-10-CM | POA: Diagnosis not present

## 2024-06-10 DIAGNOSIS — I5030 Unspecified diastolic (congestive) heart failure: Secondary | ICD-10-CM

## 2024-06-10 DIAGNOSIS — I820 Budd-Chiari syndrome: Secondary | ICD-10-CM

## 2024-06-10 LAB — CBC
HCT: 25 % — ABNORMAL LOW (ref 39.0–52.0)
Hemoglobin: 8 g/dL — ABNORMAL LOW (ref 13.0–17.0)
MCH: 32.4 pg (ref 26.0–34.0)
MCHC: 32 g/dL (ref 30.0–36.0)
MCV: 101.2 fL — ABNORMAL HIGH (ref 80.0–100.0)
Platelets: 100 K/uL — ABNORMAL LOW (ref 150–400)
RBC: 2.47 MIL/uL — ABNORMAL LOW (ref 4.22–5.81)
RDW: 27.6 % — ABNORMAL HIGH (ref 11.5–15.5)
WBC: 21.7 K/uL — ABNORMAL HIGH (ref 4.0–10.5)
nRBC: 0.4 % — ABNORMAL HIGH (ref 0.0–0.2)

## 2024-06-10 LAB — RENAL FUNCTION PANEL
Albumin: 3.1 g/dL — ABNORMAL LOW (ref 3.5–5.0)
Anion gap: 14 (ref 5–15)
BUN: 66 mg/dL — ABNORMAL HIGH (ref 8–23)
CO2: 22 mmol/L (ref 22–32)
Calcium: 8.4 mg/dL — ABNORMAL LOW (ref 8.9–10.3)
Chloride: 94 mmol/L — ABNORMAL LOW (ref 98–111)
Creatinine, Ser: 3.74 mg/dL — ABNORMAL HIGH (ref 0.61–1.24)
GFR, Estimated: 17 mL/min — ABNORMAL LOW (ref >60)
Glucose, Bld: 88 mg/dL (ref 70–99)
Phosphorus: 5.6 mg/dL — ABNORMAL HIGH (ref 2.5–4.6)
Potassium: 4.3 mmol/L (ref 3.5–5.1)
Sodium: 130 mmol/L — ABNORMAL LOW (ref 135–145)

## 2024-06-10 LAB — LACTIC ACID, PLASMA
Lactic Acid, Venous: 3.6 mmol/L (ref 0.5–1.9)
Lactic Acid, Venous: 3.6 mmol/L (ref 0.5–1.9)
Lactic Acid, Venous: 3.7 mmol/L (ref 0.5–1.9)
Lactic Acid, Venous: 3.2 mmol/L (ref 0.5–1.9)

## 2024-06-10 LAB — GLUCOSE, CAPILLARY
Glucose-Capillary: 75 mg/dL (ref 70–99)
Glucose-Capillary: 85 mg/dL (ref 70–99)
Glucose-Capillary: 92 mg/dL (ref 70–99)
Glucose-Capillary: 89 mg/dL (ref 70–99)

## 2024-06-10 LAB — PATHOLOGIST SMEAR REVIEW

## 2024-06-10 LAB — MAGNESIUM: Magnesium: 2.7 mg/dL — ABNORMAL HIGH (ref 1.7–2.4)

## 2024-06-10 MED ORDER — ALBUMIN HUMAN 25 % IV SOLN
INTRAVENOUS | Status: AC
  Filled 2024-06-10: qty 100

## 2024-06-10 MED ORDER — ALBUMIN HUMAN 25 % IV SOLN
50.0000 g | Freq: Once | INTRAVENOUS | Status: AC
  Administered 2024-06-10: 50 g via INTRAVENOUS

## 2024-06-10 MED ORDER — ALTEPLASE 2 MG IJ SOLR: 2.0000 mg | Freq: Once | INTRAMUSCULAR | Status: DC | PRN

## 2024-06-10 MED ORDER — HEPARIN SODIUM (PORCINE) 1000 UNIT/ML DIALYSIS
1000.0000 [IU] | INTRAMUSCULAR | Status: DC | PRN
Start: 2024-06-10 — End: 2024-06-12

## 2024-06-10 MED ORDER — HEPARIN SODIUM (PORCINE) 5000 UNIT/ML IJ SOLN
5000.0000 [IU] | Freq: Two times a day (BID) | INTRAMUSCULAR | Status: DC
  Administered 2024-06-10 – 2024-06-12 (×6): 5000 [IU] via SUBCUTANEOUS
  Filled 2024-06-10 (×6): qty 1

## 2024-06-10 NOTE — Progress Notes (Signed)
 Pt receives out-pt HD at [facility] on TTS 11am chair time. Will assist as needed   Suzen Satchel Dialysis Navigator [phone]

## 2024-06-10 NOTE — Progress Notes (Signed)
 Inpatient Rehab Admissions Coordinator:   Per therapy recommendations pt was screened for CIR by Reche Lowers, PT, DPT.  Not able to transition OOB today.  We will rescreen for progress with next therapy session.    Reche Lowers, PT, DPT Admissions Coordinator [phone] 06/10/24  4:37 PM

## 2024-06-10 NOTE — Progress Notes (Signed)
 Occupational Therapy Treatment Patient Details Name: Kevin Gross MRN: [id number] DOB: Jul 13, 1956 Today's Date: 06/10/2024   History of present illness 68 y.o. male who presented to [facility] ED via EMS from his home to be evaluated for hypotension and weakness. He reports his initial outpatient hemodialysis appointment was 05/28/2024 at [facility]. This initial appointment is following a recent admission at [facility], of which he was discharged on 05/24/24.     Weakness began following his dialysis appointment, pt was recently here with septic shock.  PMH: alcoholic cirrhosis of liver, CHF, COPD, HLD, HTN, stage III CKD, gout, anemia, PVD, OSA on CPAP.   OT comments  Upon entering the room, pt supine in bed and and agreeable to co-treatment with PT and OT. Pt needing max A of 2 for supine >sit and reports dizziness once EOB. Total A to don B shoes.  Pt tolerates sitting EOB for ~ 4 minutes before needing to return to supine. Pt's tolerates chair position in bed and washes face and hands with set up A. Min A needed to help pt with combing his hair while in chair position. Rest breaks needed secondary to fatigue. PT remained in room to further work with pt as therapist exits.       If plan is discharge home, recommend the following:  Two people to help with walking and/or transfers;A lot of help with bathing/dressing/bathroom;Assistance with cooking/housework;Assist for transportation;Help with stairs or ramp for entrance   Equipment Recommendations  Other (comment) (defer to next venue of care)       Precautions / Restrictions Precautions Precautions: Fall Recall of Precautions/Restrictions: Intact Precaution/Restrictions Comments: watch BP       Mobility Bed Mobility Overal bed mobility: Needs Assistance Bed Mobility: Supine to Sit, Sit to Supine     Supine to sit: Max assist, HOB elevated, Used rails, +2 for physical assistance, +2 for safety/equipment Sit to supine: Max assist, +2  for physical assistance, HOB elevated, Used rails, +2 for safety/equipment        Transfers                         Balance Overall balance assessment: Needs assistance Sitting-balance support: Feet supported, Bilateral upper extremity supported Sitting balance-Leahy Scale: Poor   Postural control: Posterior lean                                 ADL either performed or assessed with clinical judgement   ADL Overall ADL's : Needs assistance/impaired                     Lower Body Dressing: Sitting/lateral leans;Total assistance Lower Body Dressing Details (indicate cue type and reason): don B shoes                    Extremity/Trunk Assessment Upper Extremity Assessment Upper Extremity Assessment: Generalized weakness   Lower Extremity Assessment Lower Extremity Assessment: Generalized weakness        Vision Baseline Vision/History: 1 Wears glasses Patient Visual Report: No change from baseline           Communication Communication Communication: No apparent difficulties   Cognition Arousal: Alert Behavior During Therapy: WFL for tasks assessed/performed Cognition: No apparent impairments  Following commands: Intact        Cueing   Cueing Techniques: Verbal cues             Pertinent Vitals/ Pain       Pain Assessment Pain Assessment: No/denies pain         Frequency  Min 2X/week        Progress Toward Goals  OT Goals(current goals can now be found in the care plan section)  Progress towards OT goals: Progressing toward goals         Co-evaluation    PT/OT/SLP Co-Evaluation/Treatment: Yes Reason for Co-Treatment: For patient/therapist safety PT goals addressed during session: Mobility/safety with mobility;Balance OT goals addressed during session: ADL's and self-care      AM-PAC OT 6 Clicks Daily Activity     Outcome Measure   Help from another  person eating meals?: None Help from another person taking care of personal grooming?: A Little Help from another person toileting, which includes using toliet, bedpan, or urinal?: A Lot Help from another person bathing (including washing, rinsing, drying)?: A Lot Help from another person to put on and taking off regular upper body clothing?: A Little Help from another person to put on and taking off regular lower body clothing?: A Lot 6 Click Score: 16    End of Session Equipment Utilized During Treatment: Gait belt;Rolling walker (2 wheels)  OT Visit Diagnosis: Other abnormalities of gait and mobility (R26.89);Muscle weakness (generalized) (M62.81)   Activity Tolerance Patient tolerated treatment well;Patient limited by fatigue   Patient Left in bed;with call bell/phone within reach;with bed alarm set;with family/visitor present   Nurse Communication Mobility status        Time: 1021-1040 OT Time Calculation (min): 19 min  Charges: OT General Charges $OT Visit: 1 Visit OT Treatments $Therapeutic Activity: 8-22 mins  Izetta Claude, MS, OTR/L , CBIS ascom [phone]  06/10/24, 1:16 PM

## 2024-06-10 NOTE — Plan of Care (Signed)
  Problem: Education: Goal: Knowledge of General Education information will improve Description: Including pain rating scale, medication(s)/side effects and non-pharmacologic comfort measures Outcome: Progressing   Problem: Clinical Measurements: Goal: Ability to maintain clinical measurements within normal limits will improve Outcome: Progressing Goal: Cardiovascular complication will be avoided Outcome: Progressing   Problem: Nutrition: Goal: Adequate nutrition will be maintained Outcome: Progressing   Problem: Skin Integrity: Goal: Risk for impaired skin integrity will decrease Outcome: Progressing

## 2024-06-10 NOTE — Progress Notes (Signed)
 NAME:  Kevin Gross, MRN:  [id number], DOB:  24-Jul-1956, LOS: 12 ADMISSION DATE:  05/29/2024, CHIEF COMPLAINT:  Decompensated Liver Cirrhosis   History of Present Illness:   Kevin Gross is a 68 y.o. male who presented to [facility] ED via EMS from his home to be evaluated for hypotension and weakness. He reports his initial outpatient hemodialysis appointment was 05/28/2024 at [facility]. This initial appointment is following a recent admission at [facility], of which he was discharged on 05/24/24.   His overall weakness began yesterday following his dialysis appointment and has continued today, with the addition of upper URI symptoms to include minor congestion and a sore throat as of this morning. Upon EMS arrival, he was found to have a BP of 64/28, to which he received fluids and BP increased to 94/42. He reported a sore throat and generalized fatigue/weakness that resulted in a fall this morning. He also stated at baseline he has no issue ambulating with a walker.    Pertinent  Medical History  Alcohol abuse Liver Cirrhosis Hypertensin Hyperlipidemia Congestive Heart Failure End Stage Renal Disease  Significant Hospital Events: Including procedures, antibiotic start and stop dates in addition to other pertinent events   05/15/24: Admit patient to ICU due to circulatory shock s/t unclear etiology in the setting of AKI on CKD stage 4 requiring vasopressor support.  05/16/24: remains on pressors 05/17/24: No significant events overnight.  Afebrile, remains on Levophed  and Vasopressin  (Levo requirements much improved), Peritoneal fluid not consistent with SBP.  Plan for HD today at bedside.  9/22: Vital stable with some increase in creatinine, neurology ordered permanent HD catheter placement-likely be done tomorrow.  Patient will need outpatient dialysis chair before discharge. 9/23: Vital stable, slight worsening of leukocytosis, no obvious infection.  Having his complete session of dialysis  today, permanent HD catheter to be placed in the afternoon. 9/26: Discharged  10/1: Readmission with weakness and hypotension. Started on Levophed  in the ED following refractory hypotension. Midodrine  10mg  x 1. 1.5L fluids given without improvement. Transferred to the ICU for further management. 2L Lena started with improvement in O2.  10/2: developed GI bleed. Remains on levophed  and started on vaso overnight. Minimal oxygen requirements. Oliguric with plan for HD today at 11.  Abdominal Arterial Ultrasound with patent portal vein and hepatopetal flow (PORTAL VEIN THROMBOSIS RESOLVED), notes cirrhosis and moderate to large ascites.  10/3: No significant events overnight, nursing noted several small episodes of melena.  Hgb down to 6.9 this am, giving 1 unit pRBCs.  Remains on Levophed  and Vasopressin , start stress dose steroids and give Albumin .  Ultrasound yesterday negative for portal vein thrombosis, will give Vitamin K  for supratherapeutic INR.  Nephrology starting CRRT.  10/4: shock and on pressors  10/5: remains on pressors and CRRT  06/04/24-patient resting in bed comfortably. He remains hypotensive on vasopressors and dialysis.  06/05/24- EGD. there is trichomonal infection of pharynx and esophagus which may be seen with severe immunocompromised patients.  He is communicative.  He has oral mucosal sloughing and oral thrush as well.   06/06/24- patient on levophed  low dose, able to tolerate clear diet.  Has R internal jugular and subclavian central line. Severe thrush on EGD.  Trichomonas in throat and is being treated with ID specialist.  06/07/24- Patient is resting in bed in NAD.  Renal function with decrement in GFR overnight. Being treated for thrichomonal throat infection and thrush. Remains on vasopressor with PO midodrine  20mg  TID.  On zosyn , diflucan and  flagyl.  No family has been with patient all week thus far.   06/08/24- patient back on levophed  and vasopressin  infusion due to worsening  shock physiology. Paracentesis performed. 06/09/24- patient s/p paracentesis , no SBP.  Remains on vasopressors.  MELD 33. S/p rrt. 06/10/2024: awake and alert, off pressors today  Interim History / Subjective:  Pressors discontinued this AM. Denies chest pain or discomfort.   Objective    Blood pressure (!) 103/39, pulse 75, temperature 97.7 F (36.5 C), temperature source Oral, resp. rate 17, height 5' 9 (1.753 m), weight 99.7 kg, SpO2 99%. CVP:  [4 mmHg-6 mmHg] 4 mmHg      Intake/Output Summary (Last 24 hours) at 06/10/2024 0746 Last data filed at 06/10/2024 0357 Gross per 24 hour  Intake 467.82 ml  Output --  Net 467.82 ml   Filed Weights   06/08/24 0433 06/09/24 0500 06/10/24 0500  Weight: 107.3 kg 99.4 kg 99.7 kg    Examination: Physical Exam Constitutional:      Appearance: He is obese. He is ill-appearing.  Cardiovascular:     Rate and Rhythm: Normal rate and regular rhythm.     Pulses: Normal pulses.     Heart sounds: Normal heart sounds.  Pulmonary:     Effort: Pulmonary effort is normal.  Abdominal:     Palpations: Abdomen is soft.  Neurological:     General: No focal deficit present.     Mental Status: He is alert. Mental status is at baseline.      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 Has underlying liver cirrhosis, with ammonia previously elevated. Avoiding psychotropic medications as able. CV - Admitted with hypotension that is felt to be distributive. Sepsis is a possible etiology, though no source identified on culture. He was found to have esophageal candidiasis and trichomonas, currently being treated with anti-microbials. SBP ruled out, and he has received broad  spectrum antibiotics. Most recent echo 05/16/24 with normal RV/LV function, but with LV hypertrophy. I suspect his hypotension is secondary to vasoplegia due to liver cirrhosis, low oncotic pressure (malnutrition/liver disease), and renal failure. We have liberalized his blood pressure goal requirements (SBP > 85 mmHg) and started midodrine . Will re-check his lactic acid to ensure perfusion.  -goal SBP > 85 mmHg  -lactic acid trend  -resume nor-epinephrine  if vasopressor requirements increase Pulm - small pleural effusion that is stable and unchanged. He was treated with broad spectrum antibiotics. No active pulmonary problems identified at this point. GI - EtOH liver cirrhosis, with decompensation in the form of notable ascites. No varices noted. Recent portal vein thrombosis that likely further contributed to decompensation. Diet resumed. Overall poor prognosis given severity of liver disease, will consult palliative care for input.  MELD 3.0: 34 at 06/02/2024  4:30 PM MELD-Na: 33 at 06/02/2024  4:30 PM Calculated from: Serum Creatinine: 1.97 mg/dL at 89/11/7972  5:69 PM Serum Sodium: 135 mmol/L at 06/02/2024  4:30 PM Total Bilirubin: 5.4 mg/dL at 89/01/7973  5:40 AM Serum Albumin : 2.9 g/dL at 89/12/7972  6:54 PM INR(ratio): 3.5 at 06/01/2024  4:57 AM Age at listing (hypothetical): 16 years Sex: Male at 06/02/2024  4:30 PM  Renal - ESRD currently on HD. Appreciate input from nephrology. Endo - while he did have functional adrenal  insufficiency during his prior admission, his AM cortisol level was normal this admit. He did receive IV hydrocortisone 10/3-10/8. Will recheck AM cortisol level tomorrow Hem/Onc - portal vein thrombosis noted on previous admission, resolved on repeat imaging this admission. AC for PVT is in general a 6 month course, but given his frailty and GI bleed/anemia during this hospitalization this has been held. Would consider resuming if his H/H stabilize. Resumed DVT  prophylaxis. ID - esophageal and oropharyngeal candidiasis, in addition to trichomonas (?tenax or pentatrichomas hominis per ID) in the esophageal brushing. On Fluconazole (14 to 21 days minimum) and Flagyl (10-14 days). Rest of cultures remain negative.  Labs   CBC: Recent Labs  Lab 06/06/24 0446 06/06/24 1230 06/06/24 1825 06/07/24 0416 06/08/24 0417 06/09/24 0509 06/10/24 0422  WBC 20.0*  --   --  20.3* 29.0* 22.0* 21.7*  HGB 7.7*   < > 8.4* 8.3* 9.1* 8.3* 8.0*  HCT 24.3*   < > 26.7* 26.8* 28.9* 26.1* 25.0*  MCV 103.0*  --   --  104.3* 103.2* 100.0 101.2*  PLT 79*  --   --  80* 115* 81* 100*   < > = values in this interval not displayed.    Basic Metabolic Panel: Recent Labs  Lab 06/06/24 0446 06/06/24 1618 06/07/24 0416 06/08/24 0417 06/09/24 0509 06/10/24 0422  NA 132* 134* 133* 134* 134* 130*  K 4.3 4.3 4.4 5.0 3.9 4.3  CL 99 101 102 99 96* 94*  CO2 23 24 23  21* 26 22  GLUCOSE 124* 147* 122* 110* 117* 88  BUN 39* 41* 51* 76* 43* 66*  CREATININE 1.84* 1.97* 2.53* 3.88* 2.37* 3.74*  CALCIUM  8.0* 8.2* 8.4* 8.4* 8.4* 8.4*  MG 2.8*  --  2.7* 3.0* 2.1 2.7*  PHOS 3.7 3.6 4.6 5.1* 3.5 5.6*   GFR: Estimated Creatinine Clearance: 22.6 mL/min (A) (by C-G formula based on SCr of 3.74 mg/dL (H)). Recent Labs  Lab 06/07/24 0416 06/07/24 1357 06/08/24 0417 06/09/24 0509 06/10/24 0422  WBC 20.3*  --  29.0* 22.0* 21.7*  LATICACIDVEN  --  1.8  --   --   --     Liver Function Tests: Recent Labs  Lab 06/06/24 1618 06/07/24 0416 06/08/24 0417 06/09/24 0509 06/10/24 0422  ALBUMIN  2.7* 2.7* 2.3* 2.7* 3.1*   No results for input(s): LIPASE, AMYLASE in the last 168 hours. No results for input(s): AMMONIA in the last 168 hours.  ABG    Component Value Date/Time   PHART 7.298 (L) 11/27/2021 0345   PCO2ART 39.7 11/27/2021 0345   PO2ART 116 (H) 11/27/2021 0345   HCO3 21.9 05/29/2024 2040   TCO2 21 (L) 08/08/2023 0916   ACIDBASEDEF 2.8 (H) 05/29/2024 2040    O2SAT 73.5 06/01/2024 1125     Coagulation Profile: Recent Labs  Lab 06/04/24 0517 06/05/24 0520 06/08/24 1053  INR 1.7* 1.7* 1.7*    Cardiac Enzymes: No results for input(s): CKTOTAL, CKMB, CKMBINDEX, TROPONINI in the last 168 hours.  HbA1C: Hgb A1c MFr Bld  Date/Time Value Ref Range Status  11/27/2021 03:28 AM 5.6 4.8 - 5.6 % Final    Comment:    (NOTE) Pre diabetes:          5.7%-6.4%  Diabetes:              >6.4%  Glycemic control for   <7.0% adults with diabetes     CBG: No results for input(s): GLUCAP in the last 168 hours.  Review of Systems:  N/A  Past Medical History:  He,  has a past medical history of Alcoholic cirrhosis of liver (HCC), Anemia (2024), Cataract (2024), CHF (congestive heart failure) (HCC), Chronic gouty arthritis, CKD (chronic kidney disease), stage III (HCC), Clotting disorder, Complication of anesthesia, COPD (chronic obstructive pulmonary disease) (HCC), Hyperlipidemia, Hypertension, Hyperuricemia, Peripheral arterial disease, Pre-diabetes, Prostate cancer (HCC) (2023), and Sleep apnea.   Surgical History:   Past Surgical History:  Procedure Laterality Date   ABDOMINAL AORTOGRAM W/LOWER EXTREMITY N/A 10/20/2021   Procedure: ABDOMINAL AORTOGRAM W/LOWER EXTREMITY;  Surgeon: Darron Deatrice LABOR, MD;  Location: [facility];  Service: Cardiovascular;  Laterality: N/A;   ABDOMINAL AORTOGRAM W/LOWER EXTREMITY N/A 08/08/2023   Procedure: ABDOMINAL AORTOGRAM W/LOWER EXTREMITY;  Surgeon: Serene Gaile ORN, MD;  Location: [facility];  Service: Cardiovascular;  Laterality: N/A;   ALLOGRAFT APPLICATION Bilateral 12/22/2021   Procedure: ALLOGRAFT APPLICATION;  Surgeon: Serene Gaile ORN, MD;  Location: [facility] OR;  Service: Vascular;  Laterality: Bilateral;   ANGIOPLASTY Left 11/26/2021   Procedure: LEFT INFRARENAL LITHOTRIPSY;  Surgeon: Serene Gaile ORN, MD;  Location: [facility] OR;  Service: Vascular;  Laterality: Left;   APPLICATION OF  WOUND VAC Bilateral 12/13/2021   Procedure: APPLICATION OF WOUND VAC;  Surgeon: Lanis Fonda BRAVO, MD;  Location: [facility] OR;  Service: Vascular;  Laterality: Bilateral;   CARDIAC CATHETERIZATION     [facility]   CATARACT EXTRACTION W/PHACO Left 04/04/2023   Procedure: CATARACT EXTRACTION PHACO AND INTRAOCULAR LENS PLACEMENT (IOC) LEFT 8.71 00:45.5;  Surgeon: Jaye Fallow, MD;  Location: [facility];  Service: Ophthalmology;  Laterality: Left;   CATARACT EXTRACTION W/PHACO Right 04/18/2023   Procedure: CATARACT EXTRACTION PHACO AND INTRAOCULAR LENS PLACEMENT (IOC) RIGHT 4.31 00:8.2;  Surgeon: Jaye Fallow, MD;  Location: [facility];  Service: Ophthalmology;  Laterality: Right;   COLONOSCOPY     COLONOSCOPY WITH PROPOFOL  N/A 01/17/2018   Procedure: COLONOSCOPY WITH PROPOFOL ;  Surgeon: Toledo, Ladell POUR, MD;  Location: [facility] ENDOSCOPY;  Service: Gastroenterology;  Laterality: N/A;   COLONOSCOPY WITH PROPOFOL  N/A 09/12/2022   Procedure: COLONOSCOPY WITH PROPOFOL ;  Surgeon: Maryruth Ole DASEN, MD;  Location: [facility]COPY;  Service: Endoscopy;  Laterality: N/A;   COLONOSCOPY WITH PROPOFOL  N/A 09/05/2023   Procedure: COLONOSCOPY WITH PROPOFOL ;  Surgeon: Maryruth Ole DASEN, MD;  Location: [facility] ENDOSCOPY;  Service: Endoscopy;  Laterality: N/A;   DIALYSIS/PERMA CATHETER INSERTION Right 05/21/2024   Procedure: DIALYSIS/PERMA CATHETER INSERTION;  Surgeon: Jama Cordella MATSU, MD;  Location: [facility];  Service: Cardiovascular;  Laterality: Right;   ENDARTERECTOMY FEMORAL Bilateral 11/26/2021   Procedure: BILATERAL FEMORAL ENDARTERECTOMY WITH VEIN PATCH ANGIOPLASTY;  Surgeon: Serene Gaile ORN, MD;  Location: [facility] OR;  Service: Vascular;  Laterality: Bilateral;   ENTEROSCOPY N/A 09/05/2023   Procedure: ENTEROSCOPY;  Surgeon: Maryruth Ole DASEN, MD;  Location: [facility] ENDOSCOPY;  Service: Endoscopy;  Laterality: N/A;   ESOPHAGOGASTRODUODENOSCOPY N/A 06/05/2024   Procedure: EGD  (ESOPHAGOGASTRODUODENOSCOPY);  Surgeon: Therisa Bi, MD;  Location: [facility] ENDOSCOPY;  Service: Gastroenterology;  Laterality: N/A;  IC-10   ESOPHAGOGASTRODUODENOSCOPY (EGD) WITH PROPOFOL  N/A 01/17/2018   Procedure: ESOPHAGOGASTRODUODENOSCOPY (EGD) WITH PROPOFOL ;  Surgeon: Toledo, Ladell POUR, MD;  Location: [facility] ENDOSCOPY;  Service: Gastroenterology;  Laterality: N/A;   ESOPHAGOGASTRODUODENOSCOPY (EGD) WITH PROPOFOL  N/A 09/12/2022   Procedure: ESOPHAGOGASTRODUODENOSCOPY (EGD) WITH PROPOFOL ;  Surgeon: Maryruth Ole DASEN, MD;  Location: [facility] ENDOSCOPY;  Service: Endoscopy;  Laterality: N/A;   EYE SURGERY     GROIN DEBRIDEMENT Bilateral 12/16/2021   Procedure: IRRIGATION AND DEBRIDEMENT OF  BILATERAL GROINS;  Surgeon: Serene Gaile ORN, MD;  Location: [facility] OR;  Service: Vascular;  Laterality: Bilateral;   INCISION AND DRAINAGE OF WOUND Bilateral 12/13/2021   Procedure: IRRIGATION AND DEBRIDEMENT OF BILATERAL GROIN WOUNDS;  Surgeon: Lanis Fonda BRAVO, MD;  Location: [facility] OR;  Service: Vascular;  Laterality: Bilateral;   INSERTION OF ILIAC STENT Bilateral 11/26/2021   Procedure: BILATERAL ILIAC STENTING USING 74mmX80mm INNOVA STENT ON LEFT ILIAC AND 65mmX59mm, 56mmX79mm VBX AND 95mmX7.5cm VIABAHN STENT ON RIGHT ILIAC;  Surgeon: Serene Gaile ORN, MD;  Location: [facility] OR;  Service: Vascular;  Laterality: Bilateral;   INSERTION OF SEEDS IN PROSTATE     ORIF ANKLE FRACTURE Right    PERIPHERAL INTRAVASCULAR LITHOTRIPSY  08/08/2023   Procedure: PERIPHERAL INTRAVASCULAR LITHOTRIPSY;  Surgeon: Serene Gaile ORN, MD;  Location: [facility];  Service: Cardiovascular;;   PROSTATE BIOPSY N/A 04/01/2021   Procedure: PROSTATE BIOPSY GRAYCE;  Surgeon: Kassie Ozell SAUNDERS, MD;  Location: [facility] ORS;  Service: Urology;  Laterality: N/A;   VEIN HARVEST Bilateral 11/26/2021   Procedure: VEIN HARVEST OF BILATERAL SPAHENOUS VEINS;  Surgeon: Serene Gaile ORN, MD;  Location: [facility] OR;  Service: Vascular;  Laterality: Bilateral;   WOUND  DEBRIDEMENT Bilateral 12/22/2021   Procedure: INCISION AND DEBRIDEMENT OF BILATERAL GROINS;  Surgeon: Serene Gaile ORN, MD;  Location: [facility] OR;  Service: Vascular;  Laterality: Bilateral;     Social History:   reports that he quit smoking about 9 years ago. His smoking use included cigarettes. He started smoking about 56 years ago. He has a 47 pack-year smoking history. He has never been exposed to tobacco smoke. He has never used smokeless tobacco. He reports that he does not currently use alcohol. He reports that he does not use drugs.   Family History:  His family history includes Heart disease in his brother and mother.   Allergies Allergies  Allergen Reactions   Jardiance [Empagliflozin]     Loss of balance, hypoglycemic      Home Medications  Prior to Admission medications   Medication Sig Start Date End Date Taking? Authorizing Provider  allopurinol  (ZYLOPRIM ) 100 MG tablet Take 150 mg by mouth daily.   Yes [provider]  amiodarone  (PACERONE ) 200 MG tablet Take 200 mg by mouth 2 (two) times daily.   Yes [provider]  APIXABAN  (ELIQUIS ) VTE STARTER PACK (10MG  AND 5MG ) Take as directed on package: start with two-5mg  tablets twice daily for 7 days. On day 8, switch to one-5mg  tablet twice daily. 05/24/24  Yes Jens Durand, MD  aspirin  EC 81 MG tablet Take 81 mg by mouth every morning.   Yes [provider]  benzonatate (TESSALON) 100 MG capsule Take 200 mg by mouth 3 (three) times daily as needed. 05/17/24  Yes [provider]  Cholecalciferol  (VITAMIN D3) 25 MCG (1000 UT) CAPS Take 2,000 Units by mouth daily.   Yes [provider]  ezetimibe  (ZETIA ) 10 MG tablet Take 1 tablet (10 mg total) by mouth daily. 04/10/18  Yes Gollan, Timothy J, MD  iron  polysaccharides (NIFEREX) 150 MG capsule Take 1 capsule (150 mg total) by mouth 2 (two) times daily. 03/11/24 06/09/24 Yes Sreenath, Sudheer B, MD  midodrine  (PROAMATINE ) 10 MG tablet Take 1  tablet (10 mg total) by mouth 3 (three) times daily with meals. 05/24/24  Yes Jens Durand, MD  pantoprazole  (PROTONIX ) 40 MG tablet Take 40 mg by mouth daily.   Yes [provider]  simvastatin  (ZOCOR ) 20 MG tablet  Take 20 mg by mouth in the morning.   Yes [provider]  triamcinolone  ointment (KENALOG ) 0.1 % Apply 1 Application topically 2 (two) times daily as needed (psoriasis). 07/18/23  Yes [provider]  zolpidem  (AMBIEN ) 10 MG tablet Take 10 mg by mouth at bedtime.   Yes [provider]     The patient is critically ill due to circulatory shock, liver failure/cirrhosis, ESLD.  Critical care was necessary to treat or prevent imminent or life-threatening deterioration. Critical care time was spent by me on the following activities: development of a treatment plan with the patient and/or surrogate as well as nursing, discussions with consultants, evaluation of the patient's response to treatment, examination of the patient, obtaining a history from the patient or surrogate, ordering and performing treatments and interventions, ordering and review of laboratory studies, ordering and review of radiographic studies, review of telemetry data including pulse oximetry, re-evaluation of patient's condition and participation in multidisciplinary rounds.   I personally spent 49 minutes providing critical care not including any separately billable procedures.   Belva November, MD [facility] 06/10/2024 1:18 PM

## 2024-06-10 NOTE — Consult Note (Cosign Needed Addendum)
 Consultation Note Date: 06/10/2024   Patient Name: Kevin Gross  DOB: [birth year]  MRN: [id number]  Age / Sex: 68 y.o., male  PCP: Autry Grayce LABOR, PA Referring Physician: Isadora Hose, MD  Reason for Consultation: Establishing goals of care  HPI/Patient Profile: Per notes, Kevin Gross is a 68 y.o. male who presented to [facility] ED via EMS from his home to be evaluated for hypotension and weakness. He reports his initial outpatient hemodialysis appointment was 05/28/2024 at [facility]. This initial appointment is following a recent admission at [facility], of which he was discharged on 05/24/24.   His overall weakness began yesterday following his dialysis appointment and has continued today, with the addition of upper URI symptoms to include minor congestion and a sore throat as of this morning. Upon EMS arrival, he was found to have a BP of 64/28, to which he received fluids and BP increased to 94/42. He reported a sore throat and generalized fatigue/weakness that resulted in a fall this morning. He also stated at baseline he has no issue ambulating with a walker.    Clinical Assessment and Goals of Care: Notes and labs reviewed.  Spoke with CCM and nursing for updates.  In to see patient.  He is currently resting in bed at this time, no family at bedside.  He discusses that he lives with his wife Kevin Gross.  He states he provides care for her as she is essentially bedridden.  He states he uses a walker, wheelchair, and cane depending on the situation.  He states they have 5 children who do assist in caring for his wife at times. He states he enjoys listening to classic rock music when he has time.  We discussed his diagnoses, GOC, EOL wishes disposition and options.  A detailed discussion was had today regarding advanced directives.  Concepts specific to code status, artifical feeding and hydration, IV antibiotics  and rehospitalization were discussed.  The difference between an aggressive medical intervention path and a comfort care path was discussed.  Values and goals of care important to patient and family were attempted to be elicited.  Discussed limitations of medical interventions to prolong quality of life in some situations and discussed the concept of human mortality.  He states his wife Kevin Gross would be his surrogate Management consultant.  He states a doctor talked to him in depth regarding CODE STATUS almost 2 weeks ago.  He is able to articulate appropriately risks and possible consequences of CPR, that he states were discussed with him.  He shares that he has had time to consider the risks of CPR and does not want it; he would rather die peacefully and naturally.  He states he would be amenable to being placed on life support temporarily, but is very clear that he would not want to live in a coma, or as a vegetable.  He states he would not want tracheostomy.  He states he would never want a feeding tube.  He states the medical teams have kept his wife well  updated, and he states he has discussed his decision for no CPR with her.  CCM updated.  I completed a MOST form today and the signed original was placed in the chart. Each section of options on the form were reviewed in full detail and any questions were answered as needed. The form was scanned and sent to medical records for it to be uploaded under ACP tab in Epic. A photocopy was also placed in the chart to be scanned into EMR. The patient outlined their wishes for the following treatment decisions:  Cardiopulmonary Resuscitation: Do Not Attempt Resuscitation (DNR/No CPR)  Medical Interventions: Full Scope of Treatment: Use intubation, advanced airway interventions, mechanical ventilation, cardioversion as indicated, medical treatment, IV fluids, etc, also provide comfort measures. Transfer to the hospital if indicated  Antibiotics: Antibiotics if  indicated  IV Fluids: IV fluids if indicated  Feeding Tube: No feeding tube     SUMMARY OF RECOMMENDATIONS   CODE STATUS changed to DNR.       Primary Diagnoses: Present on Admission:  Septic shock (HCC)   I have reviewed the medical record, interviewed the patient and family, and examined the patient. The following aspects are pertinent.  Past Medical History:  Diagnosis Date   Alcoholic cirrhosis of liver (HCC)    Anemia 2024   Cataract 2024   CHF (congestive heart failure) (HCC)    Chronic gouty arthritis    CKD (chronic kidney disease), stage III (HCC)    Clotting disorder    Bilateral legs-pt states plaque build up in both legs requiring intervention   Complication of anesthesia    pt states that years ago during colonoscopy at [facility] they had to abort the procedure due to some cardiac issue had to see cardiologist   COPD (chronic obstructive pulmonary disease) (HCC)    Hyperlipidemia    Hypertension    Hyperuricemia    Peripheral arterial disease    Pre-diabetes    Prostate cancer (HCC) 2023   Sleep apnea    uses cpap   Social History   Socioeconomic History   Marital status: Married    Spouse name: Kevin Gross   Number of children: 2   Years of education: Not on file   Highest education level: Not on file  Occupational History   Occupation: disabled  Tobacco Use   Smoking status: Former    Current packs/day: 0.00    Average packs/day: 1 pack/day for 47.0 years (47.0 ttl pk-yrs)    Types: Cigarettes    Start date: 59    Quit date: 2016    Years since quitting: 9.7    Passive exposure: Never   Smokeless tobacco: Never  Vaping Use   Vaping status: Never Used  Substance and Sexual Activity   Alcohol use: Not Currently   Drug use: No   Sexual activity: Not Currently    Birth control/protection: Other-see comments    Comment: ED  Other Topics Concern   Not on file  Social History Narrative   Not on file   Social Drivers of Health   Financial  Resource Strain: Low Risk  (07/17/2023)   Overall Financial Resource Strain (CARDIA)    Difficulty of Paying Living Expenses: Not hard at all  Food Insecurity: No Food Insecurity (05/29/2024)   Hunger Vital Sign    Worried About Running Out of Food in the Last Year: Never true    Ran Out of Food in the Last Year: Never true  Transportation Needs: No Transportation Needs (  05/29/2024)   PRAPARE - Administrator, Civil Service (Medical): No    Lack of Transportation (Non-Medical): No  Physical Activity: Not on file  Stress: Not on file  Social Connections: Moderately Isolated (05/29/2024)   Social Connection and Isolation Panel    Frequency of Communication with Friends and Family: More than three times a week    Frequency of Social Gatherings with Friends and Family: More than three times a week    Attends Religious Services: Never    Database administrator or Organizations: No    Attends Engineer, structural: Never    Marital Status: Married   Family History  Problem Relation Age of Onset   Heart disease Mother    Heart disease Brother    Scheduled Meds:  vitamin C   500 mg Oral BID   Chlorhexidine  Gluconate Cloth  6 each Topical Daily   epoetin  alfa-epbx (RETACRIT ) injection  10,000 Units Subcutaneous Q T,Th,Sat-1800   fluconazole  200 mg Oral QHS   heparin  injection (subcutaneous)  5,000 Units Subcutaneous Q12H   magic mouthwash w/lidocaine   10 mL Oral TID   midodrine   20 mg Oral TID WC   multivitamin  1 tablet Oral QHS   pantoprazole  (PROTONIX ) IV  40 mg Intravenous Q12H   Ensure Max Protein  11 oz Oral BID   Continuous Infusions:  metronidazole Stopped (06/10/24 1148)   norepinephrine  (LEVOPHED ) Adult infusion 6 mcg/min (06/10/24 1300)   PRN Meds:.docusate sodium , mouth rinse, polyethylene glycol Medications Prior to Admission:  Prior to Admission medications   Medication Sig Start Date End Date Taking? Authorizing Provider  allopurinol  (ZYLOPRIM ) 100  MG tablet Take 150 mg by mouth daily.   Yes [provider]  amiodarone  (PACERONE ) 200 MG tablet Take 200 mg by mouth 2 (two) times daily.   Yes [provider]  APIXABAN  (ELIQUIS ) VTE STARTER PACK (10MG  AND 5MG ) Take as directed on package: start with two-5mg  tablets twice daily for 7 days. On day 8, switch to one-5mg  tablet twice daily. 05/24/24  Yes Jens Durand, MD  aspirin  EC 81 MG tablet Take 81 mg by mouth every morning.   Yes [provider]  benzonatate (TESSALON) 100 MG capsule Take 200 mg by mouth 3 (three) times daily as needed. 05/17/24  Yes [provider]  Cholecalciferol  (VITAMIN D3) 25 MCG (1000 UT) CAPS Take 2,000 Units by mouth daily.   Yes [provider]  ezetimibe  (ZETIA ) 10 MG tablet Take 1 tablet (10 mg total) by mouth daily. 04/10/18  Yes Gollan, Timothy J, MD  iron  polysaccharides (NIFEREX) 150 MG capsule Take 1 capsule (150 mg total) by mouth 2 (two) times daily. 03/11/24 06/09/24 Yes Sreenath, Sudheer B, MD  midodrine  (PROAMATINE ) 10 MG tablet Take 1 tablet (10 mg total) by mouth 3 (three) times daily with meals. 05/24/24  Yes Jens Durand, MD  pantoprazole  (PROTONIX ) 40 MG tablet Take 40 mg by mouth daily.   Yes [provider]  simvastatin  (ZOCOR ) 20 MG tablet Take 20 mg by mouth in the morning.   Yes [provider]  triamcinolone  ointment (KENALOG ) 0.1 % Apply 1 Application topically 2 (two) times daily as needed (psoriasis). 07/18/23  Yes [provider]  zolpidem  (AMBIEN ) 10 MG tablet Take 10 mg by mouth at bedtime.   Yes [provider]   Allergies  Allergen Reactions   Jardiance [Empagliflozin]     Loss of balance, hypoglycemic    Review of Systems  All other systems reviewed and are negative.   Physical Exam Pulmonary:     Effort: Pulmonary effort is normal.  Skin:    General: Skin is warm and dry.  Neurological:     Mental Status: He is alert.     Vital Signs: BP (!)  93/43   Pulse 72   Temp 98.2 F (36.8 C) (Oral)   Resp 18   Ht 5' 9 (1.753 m)   Wt 99.7 kg   SpO2 95%   BMI 32.46 kg/m  Pain Scale: 0-10   Pain Score: 0-No pain   SpO2: SpO2: 95 % O2 Device:SpO2: 95 % O2 Flow Rate: .O2 Flow Rate (L/min): 3 L/min  IO: Intake/output summary:  Intake/Output Summary (Last 24 hours) at 06/10/2024 1526 Last data filed at 06/10/2024 1300 Gross per 24 hour  Intake 591.1 ml  Output --  Net 591.1 ml    LBM: Last BM Date : 06/09/24 Baseline Weight: Weight: 113.2 kg Most recent weight: Weight: 99.7 kg      Signed by: Camelia Lewis, NP   Please contact Palliative Medicine Team phone at [phone] for questions and concerns.  For individual provider: See Tracey

## 2024-06-10 NOTE — Plan of Care (Signed)
  Problem: Education: Goal: Knowledge of General Education information will improve Description: Including pain rating scale, medication(s)/side effects and non-pharmacologic comfort measures Outcome: Progressing   Problem: Clinical Measurements: Goal: Cardiovascular complication will be avoided Outcome: Progressing   Problem: Activity: Goal: Risk for activity intolerance will decrease Outcome: Progressing   Problem: Nutrition: Goal: Adequate nutrition will be maintained Outcome: Progressing   Problem: Coping: Goal: Level of anxiety will decrease Outcome: Progressing   Problem: Pain Managment: Goal: General experience of comfort will improve and/or be controlled Outcome: Progressing   Problem: Safety: Goal: Ability to remain free from injury will improve Outcome: Progressing

## 2024-06-10 NOTE — Progress Notes (Signed)
 Physical Therapy Treatment Patient Details Name: Kevin Gross MRN: [id number] DOB: 1955/11/23 Today's Date: 06/10/2024   History of Present Illness 68 y.o. male who presented to [facility] ED via EMS from his home to be evaluated for hypotension and weakness. He reports his initial outpatient hemodialysis appointment was 05/28/2024 at [facility]. This initial appointment is following a recent admission at [facility], of which he was discharged on 05/24/24.     Weakness began following his dialysis appointment, pt was recently here with septic shock.  PMH: alcoholic cirrhosis of liver, CHF, COPD, HLD, HTN, stage III CKD, gout, anemia, PVD, OSA on CPAP.    PT Comments  Pt seen for PT tx with pt agreeable, co-tx with OT for pt & therapists' safety. Pt very pleasant & agreeable, willing to participate in all mobility attempts. Pt requires +2 for supine<>sit, tolerates sitting EOB ~2 minutes with c/o dizziness that improves once in supine. Pt performs BLE strengthening exercises with AAROM, cuing re: technique. Pt positioned upright in chair position. Recommend ongoing PT services to progress mobility as able.   If plan is discharge home, recommend the following: Two people to help with walking and/or transfers;Two people to help with bathing/dressing/bathroom   Can travel by private vehicle        Equipment Recommendations  Rolling walker (2 wheels);BSC/3in1;Hospital bed;Wheelchair (measurements PT);Wheelchair cushion (measurements PT)    Recommendations for Other Services       Precautions / Restrictions Precautions Precautions: Fall Precaution/Restrictions Comments: watch BP Restrictions Weight Bearing Restrictions Per Provider Order: No     Mobility  Bed Mobility Overal bed mobility: Needs Assistance Bed Mobility: Supine to Sit, Sit to Supine     Supine to sit: Max assist, HOB elevated, Used rails, +2 for physical assistance, +2 for safety/equipment (exit L side of bed, cuing to walk  BLE off EOB &  upright trunk) Sit to supine: Max assist, +2 for physical assistance, HOB elevated, Used rails, +2 for safety/equipment        Transfers Overall transfer level:  (deferred)                      Ambulation/Gait                   Stairs             Wheelchair Mobility     Tilt Bed    Modified Rankin (Stroke Patients Only)       Balance Overall balance assessment: Needs assistance Sitting-balance support: Feet supported, Bilateral upper extremity supported Sitting balance-Leahy Scale: Poor   Postural control: Posterior lean                                  Communication Communication Communication: No apparent difficulties  Cognition Arousal: Alert Behavior During Therapy: WFL for tasks assessed/performed   PT - Cognitive impairments: No apparent impairments                       PT - Cognition Comments: appreciative of PT efforts, repeatedly states I'll try re: any activity, mobility attempts, very nice Following commands: Intact      Cueing Cueing Techniques: Verbal cues  Exercises General Exercises - Lower Extremity Short Arc Quad: AROM, Strengthening, Both, 10 reps Heel Slides: AAROM, Supine, Strengthening, Both, 10 reps    General Comments        Pertinent Vitals/Pain  Pain Assessment Pain Assessment: No/denies pain    Home Living                          Prior Function            PT Goals (current goals can now be found in the care plan section) Acute Rehab PT Goals Patient Stated Goal: feel stronger and go home PT Goal Formulation: With patient Time For Goal Achievement: 06/19/24 Potential to Achieve Goals: Fair Progress towards PT goals: Progressing toward goals    Frequency    Min 3X/week      PT Plan      Co-evaluation PT/OT/SLP Co-Evaluation/Treatment: Yes Reason for Co-Treatment: For patient/therapist safety PT goals addressed during session:  Mobility/safety with mobility;Balance        AM-PAC PT 6 Clicks Mobility   Outcome Measure  Help needed turning from your back to your side while in a flat bed without using bedrails?: A Lot Help needed moving from lying on your back to sitting on the side of a flat bed without using bedrails?: Total Help needed moving to and from a bed to a chair (including a wheelchair)?: Total Help needed standing up from a chair using your arms (e.g., wheelchair or bedside chair)?: Total Help needed to walk in hospital room?: Total Help needed climbing 3-5 steps with a railing? : Total 6 Click Score: 7    End of Session Equipment Utilized During Treatment: Oxygen Activity Tolerance: Patient tolerated treatment well Patient left: in bed;with call bell/phone within reach;with bed alarm set;with SCD's reapplied (in chair position, pillow under BLE) Nurse Communication: Mobility status (BP) PT Visit Diagnosis: Muscle weakness (generalized) (M62.81);Difficulty in walking, not elsewhere classified (R26.2)     Time: 8978-8955 PT Time Calculation (min) (ACUTE ONLY): 23 min  Charges:    $Therapeutic Activity: 8-22 mins PT General Charges $$ ACUTE PT VISIT: 1 Visit                     Richerd Pinal, PT, DPT 06/10/24, 11:30 AM   Richerd CHRISTELLA Pinal 06/10/2024, 11:26 AM

## 2024-06-10 NOTE — Progress Notes (Signed)
 [facility]  ROUNDING NOTE   Subjective:   Kevin Gross  is a 68 y.o.  male  with past medical conditions including hypertension, COPD, obstructive sleep apnea with CPAP, anemia, and recent acute kidney injury requiring hemodialysis. Patient presents to ED with decreased blood pressure and has been admitted to ICU for Septic shock (HCC) [A41.9, R65.21]  Patient seen and evaluated at bedside. Alert and oriented.   Update: Patient remains on norepinephrine  drip this a.m. Resting comfortably in bed. Due for dialysis treatment again tomorrow.   Objective:  Vital signs in last 24 hours:  Temp:  [97.5 F (36.4 C)-98.5 F (36.9 C)] 97.7 F (36.5 C) (10/13 0730) Pulse Rate:  [61-75] 75 (10/13 0730) Resp:  [10-25] 17 (10/13 0730) BP: (75-119)/(26-92) 103/39 (10/13 0730) SpO2:  [95 %-100 %] 99 % (10/13 0730) Weight:  [99.7 kg] 99.7 kg (10/13 0500)  Weight change: 0.3 kg Filed Weights   06/08/24 0433 06/09/24 0500 06/10/24 0500  Weight: 107.3 kg 99.4 kg 99.7 kg    Intake/Output: I/O last 3 completed shifts: In: 748.8 [I.V.:314.2; IV Piggyback:434.6] Out: 1100 [Other:1100]   Intake/Output this shift:  No intake/output data recorded.  Physical Exam: General: NAD  Head: Normocephalic, atraumatic. Moist oral mucosal membranes  Eyes: Anicteric  Lungs:  Clear to auscultation, normal effort  Heart: Regular rate and rhythm  Abdomen:  Soft, nontender  Extremities: 2+ peripheral edema  Neurologic: Arousable  Skin: Generalized ecchymosis  Access: Right IJ PermCath    Basic Metabolic Panel: Recent Labs  Lab 06/06/24 0446 06/06/24 1618 06/07/24 0416 06/08/24 0417 06/09/24 0509 06/10/24 0422  NA 132* 134* 133* 134* 134* 130*  K 4.3 4.3 4.4 5.0 3.9 4.3  CL 99 101 102 99 96* 94*  CO2 23 24 23  21* 26 22  GLUCOSE 124* 147* 122* 110* 117* 88  BUN 39* 41* 51* 76* 43* 66*  CREATININE 1.84* 1.97* 2.53* 3.88* 2.37* 3.74*  CALCIUM  8.0* 8.2* 8.4* 8.4* 8.4* 8.4*  MG  2.8*  --  2.7* 3.0* 2.1 2.7*  PHOS 3.7 3.6 4.6 5.1* 3.5 5.6*    Liver Function Tests: Recent Labs  Lab 06/06/24 1618 06/07/24 0416 06/08/24 0417 06/09/24 0509 06/10/24 0422  ALBUMIN  2.7* 2.7* 2.3* 2.7* 3.1*   No results for input(s): LIPASE, AMYLASE in the last 168 hours. No results for input(s): AMMONIA in the last 168 hours.   CBC: Recent Labs  Lab 06/06/24 0446 06/06/24 1230 06/06/24 1825 06/07/24 0416 06/08/24 0417 06/09/24 0509 06/10/24 0422  WBC 20.0*  --   --  20.3* 29.0* 22.0* 21.7*  HGB 7.7*   < > 8.4* 8.3* 9.1* 8.3* 8.0*  HCT 24.3*   < > 26.7* 26.8* 28.9* 26.1* 25.0*  MCV 103.0*  --   --  104.3* 103.2* 100.0 101.2*  PLT 79*  --   --  80* 115* 81* 100*   < > = values in this interval not displayed.    Cardiac Enzymes: No results for input(s): CKTOTAL, CKMB, CKMBINDEX, TROPONINI in the last 168 hours.  BNP: Invalid input(s): POCBNP  CBG: No results for input(s): GLUCAP in the last 168 hours.    Microbiology: Results for orders placed or performed during the hospital encounter of 05/29/24  Resp panel by RT-PCR (RSV, Flu A&B, Covid) Anterior Nasal Swab     Status: None   Collection Time: 05/29/24 12:56 PM   Specimen: Anterior Nasal Swab  Result Value Ref Range Status   SARS Coronavirus 2 by RT  PCR NEGATIVE NEGATIVE Final    Comment: (NOTE) SARS-CoV-2 target nucleic acids are NOT DETECTED.  The SARS-CoV-2 RNA is generally detectable in upper respiratory specimens during the acute phase of infection. The lowest concentration of SARS-CoV-2 viral copies this assay can detect is 138 copies/mL. A negative result does not preclude SARS-Cov-2 infection and should not be used as the sole basis for treatment or other patient management decisions. A negative result may occur with  improper specimen collection/handling, submission of specimen other than nasopharyngeal swab, presence of viral mutation(s) within the areas targeted by this  assay, and inadequate number of viral copies(<138 copies/mL). A negative result must be combined with clinical observations, patient history, and epidemiological information. The expected result is Negative.  Fact Sheet for Patients:  BloggerCourse.com  Fact Sheet for Healthcare Providers:  SeriousBroker.it  This test is no t yet approved or cleared by the [place]  FDA and  has been authorized for detection and/or diagnosis of SARS-CoV-2 by FDA under an Emergency Use Authorization (EUA). This EUA will remain  in effect (meaning this test can be used) for the duration of the COVID-19 declaration under Section 564(b)(1) of the Act, 21 U.S.C.section 360bbb-3(b)(1), unless the authorization is terminated  or revoked sooner.       Influenza A by PCR NEGATIVE NEGATIVE Final   Influenza B by PCR NEGATIVE NEGATIVE Final    Comment: (NOTE) The Xpert Xpress SARS-CoV-2/FLU/RSV plus assay is intended as an aid in the diagnosis of influenza from Nasopharyngeal swab specimens and should not be used as a sole basis for treatment. Nasal washings and aspirates are unacceptable for Xpert Xpress SARS-CoV-2/FLU/RSV testing.  Fact Sheet for Patients: BloggerCourse.com  Fact Sheet for Healthcare Providers: SeriousBroker.it  This test is not yet approved or cleared by the [place]  FDA and has been authorized for detection and/or diagnosis of SARS-CoV-2 by FDA under an Emergency Use Authorization (EUA). This EUA will remain in effect (meaning this test can be used) for the duration of the COVID-19 declaration under Section 564(b)(1) of the Act, 21 U.S.C. section 360bbb-3(b)(1), unless the authorization is terminated or revoked.     Resp Syncytial Virus by PCR NEGATIVE NEGATIVE Final    Comment: (NOTE) Fact Sheet for Patients: BloggerCourse.com  Fact Sheet for  Healthcare Providers: SeriousBroker.it  This test is not yet approved or cleared by the [place]  FDA and has been authorized for detection and/or diagnosis of SARS-CoV-2 by FDA under an Emergency Use Authorization (EUA). This EUA will remain in effect (meaning this test can be used) for the duration of the COVID-19 declaration under Section 564(b)(1) of the Act, 21 U.S.C. section 360bbb-3(b)(1), unless the authorization is terminated or revoked.  Performed at [facility], [address] [place], [place] [address]   Blood culture (routine x 2)     Status: None   Collection Time: 05/29/24 12:56 PM   Specimen: BLOOD  Result Value Ref Range Status   Specimen Description BLOOD RIGHT ANTECUBITAL  Final   Special Requests   Final    BOTTLES DRAWN AEROBIC AND ANAEROBIC Blood Culture results may not be optimal due to an inadequate volume of blood received in culture bottles   Culture   Final    NO GROWTH 5 DAYS Performed at [facility], [address], [place], [place] [address]    Report Status 06/03/2024 FINAL  Final  Blood culture (routine x 2)     Status: None   Collection Time: 05/29/24 12:56 PM  Specimen: BLOOD  Result Value Ref Range Status   Specimen Description BLOOD LEFT ANTECUBITAL  Final   Special Requests   Final    BOTTLES DRAWN AEROBIC AND ANAEROBIC Blood Culture results may not be optimal due to an inadequate volume of blood received in culture bottles   Culture   Final    NO GROWTH 5 DAYS Performed at [facility], [address], [place], [place] [address]    Report Status 06/03/2024 FINAL  Final  MRSA Next Gen by PCR, Nasal     Status: None   Collection Time: 05/29/24  2:58 PM   Specimen: Nasal Mucosa; Nasal Swab  Result Value Ref Range Status   MRSA by PCR Next Gen NOT DETECTED NOT DETECTED Final    Comment: (NOTE) The GeneXpert MRSA Assay (FDA approved for NASAL specimens only), is one component  of a comprehensive MRSA colonization surveillance program. It is not intended to diagnose MRSA infection nor to guide or monitor treatment for MRSA infections. Test performance is not FDA approved in patients less than 49 years old. Performed at [facility], [address] [place], [place] [address]   Group A Strep by PCR     Status: None   Collection Time: 05/29/24  2:58 PM   Specimen: Ascities; Sterile Swab  Result Value Ref Range Status   Group A Strep by PCR NOT DETECTED NOT DETECTED Final    Comment: Performed at [facility], [address] [place], [place] [address]  Respiratory (~20 pathogens) panel by PCR     Status: None   Collection Time: 05/29/24  3:00 PM  Result Value Ref Range Status   Adenovirus NOT DETECTED NOT DETECTED Final   Coronavirus 229E NOT DETECTED NOT DETECTED Final    Comment: (NOTE) The Coronavirus on the Respiratory Panel, DOES NOT test for the novel  Coronavirus (2019 nCoV)    Coronavirus HKU1 NOT DETECTED NOT DETECTED Final   Coronavirus NL63 NOT DETECTED NOT DETECTED Final   Coronavirus OC43 NOT DETECTED NOT DETECTED Final   Metapneumovirus NOT DETECTED NOT DETECTED Final   Rhinovirus / Enterovirus NOT DETECTED NOT DETECTED Final   Influenza A NOT DETECTED NOT DETECTED Final   Influenza B NOT DETECTED NOT DETECTED Final   Parainfluenza Virus 1 NOT DETECTED NOT DETECTED Final   Parainfluenza Virus 2 NOT DETECTED NOT DETECTED Final   Parainfluenza Virus 3 NOT DETECTED NOT DETECTED Final   Parainfluenza Virus 4 NOT DETECTED NOT DETECTED Final   Respiratory Syncytial Virus NOT DETECTED NOT DETECTED Final   Bordetella pertussis NOT DETECTED NOT DETECTED Final   Bordetella Parapertussis NOT DETECTED NOT DETECTED Final   Chlamydophila pneumoniae NOT DETECTED NOT DETECTED Final   Mycoplasma pneumoniae NOT DETECTED NOT DETECTED Final    Comment: Performed at [facility], [address], [place], [place] [address]  MRSA Next  Gen by PCR, Nasal     Status: None   Collection Time: 05/29/24  3:50 PM   Specimen: Nasal Mucosa; Nasal Swab  Result Value Ref Range Status   MRSA by PCR Next Gen NOT DETECTED NOT DETECTED Final    Comment: (NOTE) The GeneXpert MRSA Assay (FDA approved for NASAL specimens only), is one component of a comprehensive MRSA colonization surveillance program. It is not intended to diagnose MRSA infection nor to guide or monitor treatment for MRSA infections. Test performance is not FDA approved in patients less than 45 years old. Performed at [facility], [address] [place], [place] [address]   KOH prep     Status: None   Collection Time: 06/05/24 10:00 AM   Specimen: Esophagus  Result Value Ref Range Status   Specimen Description ESOPHAGUS  Final   Special Requests Immunocompromised  Final   KOH Prep   Final    YEAST WITH PSEUDOHYPHAE RESULT CALLED TO, READ BACK BY AND VERIFIED WITH: NOTIFIED RACHEL VERDI, RN 1206 06/05/24 THAT TRICHOMONAS WAS ALSO SEEN ON SLIDE  GM Performed at [facility], [address] [place], [place] [address]    Report Status 06/05/2024 FINAL  Final  Gastrointestinal Panel by PCR , Stool     Status: None   Collection Time: 06/05/24  3:04 PM   Specimen: Stool  Result Value Ref Range Status   Campylobacter species NOT DETECTED NOT DETECTED Final   Plesimonas shigelloides NOT DETECTED NOT DETECTED Final   Salmonella species NOT DETECTED NOT DETECTED Final   Yersinia enterocolitica NOT DETECTED NOT DETECTED Final   Vibrio species NOT DETECTED NOT DETECTED Final   Vibrio cholerae NOT DETECTED NOT DETECTED Final   Enteroaggregative E coli (EAEC) NOT DETECTED NOT DETECTED Final   Enteropathogenic E coli (EPEC) NOT DETECTED NOT DETECTED Final   Enterotoxigenic E coli (ETEC) NOT DETECTED NOT DETECTED Final   Shiga like toxin producing E coli (STEC) NOT DETECTED NOT DETECTED Final   Shigella/Enteroinvasive E coli (EIEC) NOT DETECTED NOT  DETECTED Final   Cryptosporidium NOT DETECTED NOT DETECTED Final   Cyclospora cayetanensis NOT DETECTED NOT DETECTED Final   Entamoeba histolytica NOT DETECTED NOT DETECTED Final   Giardia lamblia NOT DETECTED NOT DETECTED Final   Adenovirus F40/41 NOT DETECTED NOT DETECTED Final   Astrovirus NOT DETECTED NOT DETECTED Final   Norovirus GI/GII NOT DETECTED NOT DETECTED Final   Rotavirus A NOT DETECTED NOT DETECTED Final   Sapovirus (I, II, IV, and V) NOT DETECTED NOT DETECTED Final    Comment: Performed at [facility], [address] [place], [place] [address]  Body fluid culture w Gram Stain     Status: None (Preliminary result)   Collection Time: 06/08/24  1:57 PM   Specimen: Peritoneal Washings; Body Fluid  Result Value Ref Range Status   Specimen Description   Final    PERITONEAL Performed at [facility], [address] [place], [place] [address]    Special Requests   Final    NONE Performed at [facility], [address] [place], [place] [address]    Gram Stain NO WBC SEEN NO ORGANISMS SEEN   Final   Culture   Final    NO GROWTH < 24 HOURS Performed at [facility], [address], [place], [place] [address]    Report Status PENDING  Incomplete    Coagulation Studies: Recent Labs    06/08/24 1053  LABPROT 21.2*  INR 1.7*      Urinalysis: No results for input(s): COLORURINE, LABSPEC, PHURINE, GLUCOSEU, HGBUR, BILIRUBINUR, KETONESUR, PROTEINUR, UROBILINOGEN, NITRITE, LEUKOCYTESUR in the last 72 hours.  Invalid input(s): APPERANCEUR     Imaging: No results found.       Medications:    metronidazole Stopped (06/09/24 2255)   norepinephrine  (LEVOPHED ) Adult infusion 4 mcg/min (06/10/24 0357)     vitamin C   500 mg Oral BID   Chlorhexidine  Gluconate Cloth  6 each Topical Daily   epoetin  alfa-epbx (RETACRIT ) injection  10,000 Units Subcutaneous Q T,Th,Sat-1800   fluconazole  200 mg Oral  QHS   magic mouthwash w/lidocaine   10 mL Oral TID   midodrine   20 mg Oral TID WC   multivitamin  1 tablet Oral QHS   pantoprazole  (PROTONIX ) IV  40 mg Intravenous Q12H   Ensure Max Protein  11 oz Oral BID   docusate sodium , mouth rinse, polyethylene glycol, zolpidem   Assessment/ Plan:  Kevin Gross is a 68 y.o.  male  with past medical conditions including hypertension, COPD, obstructive sleep apnea with CPAP, anemia, and chronic kidney disease stage IV, who was admitted to [facility] on 05/29/2024 for Septic shock (HCC) [A41.9, R65.21]  [facility]/TTS/RT permcath  Acute kidney injury on chronic kidney disease stage IV with Volume overload Baseline creatinine 2.77 with GFR 24 06/30/2024. CT abd/pelvis shows dense lesion on left kidney.  CRRT stopped 06/06/2024. Patient last underwent dialysis treatment on early Sunday AM.  We will plan for dialysis treatment again     Lab Results  Component Value Date   CREATININE 3.74 (H) 06/10/2024   CREATININE 2.37 (H) 06/09/2024   CREATININE 3.88 (H) 06/08/2024    Intake/Output Summary (Last 24 hours) at 06/10/2024 0754 Last data filed at 06/10/2024 0357 Gross per 24 hour  Intake 467.82 ml  Output --  Net 467.82 ml   2. Hypotension likely due to shock secondary to liver cirrhosis.  Today.  Currently off of vasopressin  and only on norepinephrine .  3. Anemia of chronic kidney disease Lab Results  Component Value Date   HGB 8.0 (L) 06/10/2024  Patient has received multiple blood products during this admission.  Continue Retacrit  10,000 units IV with dialysis treatments.  4.  Secondary Hyperparathyroidism: with outpatient labs: PTH 52.7, phosphorus 4.1, calcium  8.6 on 7/2.  Lab Results  Component Value Date   CALCIUM  8.4 (L) 06/10/2024   CAION 1.21 08/08/2023   PHOS 5.6 (H) 06/10/2024  Phosphorus a bit higher at 5.6 this a.m.  We will continue to monitor bone metabolism parameters.  Patient not on Phos binders at the moment.    LOS: 12 Jannah Guardiola 10/13/20257:54 AM

## 2024-06-11 DIAGNOSIS — A419 Sepsis, unspecified organism: Secondary | ICD-10-CM | POA: Diagnosis not present

## 2024-06-11 DIAGNOSIS — Z7189 Other specified counseling: Secondary | ICD-10-CM | POA: Diagnosis not present

## 2024-06-11 DIAGNOSIS — R6521 Severe sepsis with septic shock: Secondary | ICD-10-CM | POA: Diagnosis not present

## 2024-06-11 LAB — COMPREHENSIVE METABOLIC PANEL WITH GFR
ALT: 133 U/L — ABNORMAL HIGH (ref 0–44)
AST: 213 U/L — ABNORMAL HIGH (ref 15–41)
Albumin: 3 g/dL — ABNORMAL LOW (ref 3.5–5.0)
Alkaline Phosphatase: 136 U/L — ABNORMAL HIGH (ref 38–126)
Anion gap: 14 (ref 5–15)
BUN: 80 mg/dL — ABNORMAL HIGH (ref 8–23)
CO2: 22 mmol/L (ref 22–32)
Calcium: 8.7 mg/dL — ABNORMAL LOW (ref 8.9–10.3)
Chloride: 97 mmol/L — ABNORMAL LOW (ref 98–111)
Creatinine, Ser: 4.86 mg/dL — ABNORMAL HIGH (ref 0.61–1.24)
GFR, Estimated: 12 mL/min — ABNORMAL LOW (ref >60)
Glucose, Bld: 87 mg/dL (ref 70–99)
Potassium: 4.9 mmol/L (ref 3.5–5.1)
Sodium: 133 mmol/L — ABNORMAL LOW (ref 135–145)
Total Bilirubin: 8.5 mg/dL — ABNORMAL HIGH (ref 0.0–1.2)
Total Protein: 5.1 g/dL — ABNORMAL LOW (ref 6.5–8.1)

## 2024-06-11 LAB — CBC WITH DIFFERENTIAL/PLATELET
Abs Immature Granulocytes: 0.23 K/uL — ABNORMAL HIGH (ref 0.00–0.07)
Basophils Absolute: 0 K/uL (ref 0.0–0.1)
Basophils Relative: 0 %
Eosinophils Absolute: 0.3 K/uL (ref 0.0–0.5)
Eosinophils Relative: 1 %
HCT: 24.4 % — ABNORMAL LOW (ref 39.0–52.0)
Hemoglobin: 8 g/dL — ABNORMAL LOW (ref 13.0–17.0)
Immature Granulocytes: 1 %
Lymphocytes Relative: 5 %
Lymphs Abs: 1.1 K/uL (ref 0.7–4.0)
MCH: 32.4 pg (ref 26.0–34.0)
MCHC: 32.8 g/dL (ref 30.0–36.0)
MCV: 98.8 fL (ref 80.0–100.0)
Monocytes Absolute: 1.4 K/uL — ABNORMAL HIGH (ref 0.1–1.0)
Monocytes Relative: 7 %
Neutro Abs: 17.5 K/uL — ABNORMAL HIGH (ref 1.7–7.7)
Neutrophils Relative %: 86 %
Platelets: 109 K/uL — ABNORMAL LOW (ref 150–400)
RBC: 2.47 MIL/uL — ABNORMAL LOW (ref 4.22–5.81)
RDW: 27.2 % — ABNORMAL HIGH (ref 11.5–15.5)
Smear Review: NORMAL
WBC: 20.5 K/uL — ABNORMAL HIGH (ref 4.0–10.5)
nRBC: 0.5 % — ABNORMAL HIGH (ref 0.0–0.2)

## 2024-06-11 LAB — GLUCOSE, CAPILLARY
Glucose-Capillary: 78 mg/dL (ref 70–99)
Glucose-Capillary: 80 mg/dL (ref 70–99)
Glucose-Capillary: 75 mg/dL (ref 70–99)
Glucose-Capillary: 75 mg/dL (ref 70–99)
Glucose-Capillary: 82 mg/dL (ref 70–99)
Glucose-Capillary: 90 mg/dL (ref 70–99)

## 2024-06-11 LAB — MAGNESIUM: Magnesium: 2.9 mg/dL — ABNORMAL HIGH (ref 1.7–2.4)

## 2024-06-11 LAB — PROTIME-INR
INR: 2.2 — ABNORMAL HIGH (ref 0.8–1.2)
Prothrombin Time: 26 s — ABNORMAL HIGH (ref 11.4–15.2)

## 2024-06-11 LAB — BODY FLUID CULTURE W GRAM STAIN
Culture: NO GROWTH
Gram Stain: NONE SEEN

## 2024-06-11 LAB — LACTIC ACID, PLASMA
Lactic Acid, Venous: 2.4 mmol/L (ref 0.5–1.9)
Lactic Acid, Venous: 3.4 mmol/L (ref 0.5–1.9)

## 2024-06-11 LAB — APTT: aPTT: 60 s — ABNORMAL HIGH (ref 24–36)

## 2024-06-11 LAB — CORTISOL-AM, BLOOD: Cortisol - AM: 38.6 ug/dL — ABNORMAL HIGH (ref 6.7–22.6)

## 2024-06-11 MED ORDER — ALBUMIN HUMAN 25 % IV SOLN
50.0000 g | Freq: Once | INTRAVENOUS | Status: AC
  Administered 2024-06-11: 50 g via INTRAVENOUS

## 2024-06-11 MED ORDER — METRONIDAZOLE 500 MG PO TABS
500.0000 mg | ORAL_TABLET | Freq: Three times a day (TID) | ORAL | Status: DC
  Administered 2024-06-11 – 2024-06-12 (×5): 500 mg via ORAL
  Filled 2024-06-11 (×7): qty 1

## 2024-06-11 NOTE — Progress Notes (Addendum)
 Daily Progress Note   Patient Name: Kevin Gross       Date: 06/11/2024 DOB: 1956/05/02  Age: 68 y.o. MRN#: [id number] Attending Physician: Isadora Hose, MD Primary Care Physician: Autry Grayce LABOR, PA Admit Date: 05/29/2024  Reason for Consultation/Follow-up: Establishing goals of care  Subjective: Notes and labs reviewed.  Spoke with CCM morning updates, and discussed his status. In to see patient.  He is currently resting in bed at this time.  No family at bedside.  He states he is aware of his current status.  He states his wife did come to the hospital earlier in his admission along with one of his daughters, and a provider discussed his health issues and told them of his poor prognosis.  He states he is a man of [employer] faith.  He states he is not afraid of death.  Discussed care moving forward, time trials for continuing care, and restrictions or boundaries he may have.  He states he will consider his wishes on continuing current care versus comfort focused care.  He states he has been experiencing intermittent chest pain that is worse with coughing and deep breathing.  Patient with cough during my visit. CCM made aware of patient's complaints.  Length of Stay: 13  Current Medications: Scheduled Meds:   vitamin C   500 mg Oral BID   Chlorhexidine  Gluconate Cloth  6 each Topical Daily   epoetin  alfa-epbx (RETACRIT ) injection  10,000 Units Subcutaneous Q T,Th,Sat-1800   fluconazole  200 mg Oral QHS   heparin  injection (subcutaneous)  5,000 Units Subcutaneous Q12H   magic mouthwash w/lidocaine   10 mL Oral TID   metroNIDAZOLE  500 mg Oral Q8H   midodrine   20 mg Oral TID WC   multivitamin  1 tablet Oral QHS   pantoprazole  (PROTONIX ) IV  40 mg Intravenous Q12H   Ensure Max  Protein  11 oz Oral BID    Continuous Infusions:  norepinephrine  (LEVOPHED ) Adult infusion 8 mcg/min (06/11/24 1119)    PRN Meds: alteplase , alteplase , docusate sodium , heparin , heparin , mouth rinse, polyethylene glycol  Physical Exam Pulmonary:     Effort: Pulmonary effort is normal.  Skin:    General: Skin is warm and dry.  Neurological:     Mental Status: He is alert.  Vital Signs: BP (!) 89/38   Pulse 79   Temp 98.6 F (37 C) (Oral)   Resp 17   Ht 5' 9 (1.753 m)   Wt 99.7 kg Comment: bed scale broken, previous weight documented  SpO2 (!) 88%   BMI 32.46 kg/m  SpO2: SpO2: (!) 88 % O2 Device: O2 Device: Nasal Cannula O2 Flow Rate: O2 Flow Rate (L/min): 4 L/min  Intake/output summary:  Intake/Output Summary (Last 24 hours) at 06/11/2024 1454 Last data filed at 06/11/2024 1119 Gross per 24 hour  Intake 910.47 ml  Output 0 ml  Net 910.47 ml   LBM: Last BM Date : 06/09/24 Baseline Weight: Weight: 113.2 kg Most recent weight: Weight: 99.7 kg (bed scale broken, previous weight documented)    Patient Active Problem List   Diagnosis Date Noted   Medical orders for scope of treatment form in chart 06/10/2024   Gastrointestinal hemorrhage 06/04/2024   Melena 05/31/2024   Septic shock (HCC) 05/29/2024   Encounter for dialysis and dialysis catheter care 05/22/2024   Generalized weakness 05/20/2024   Diarrhea 05/19/2024   Portal vein thrombosis 05/19/2024   Community acquired pneumonia of right lower lobe of lung 05/17/2024   Shock circulatory (HCC) 05/15/2024   ABLA (acute blood loss anemia) 03/08/2024   Acute on chronic diastolic CHF (congestive heart failure) (HCC) 03/08/2024   Hyperlipidemia 03/08/2024   Paroxysmal atrial fibrillation (HCC) 03/08/2024   Iron  deficiency anemia 07/17/2023   Symptomatic anemia 09/09/2022   Anemia 12/19/2021   Alcoholic cirrhosis of liver with ascites (HCC) 12/13/2021   Pre-diabetes 12/13/2021   OSA (obstructive  sleep apnea) 12/13/2021   Hypertension 12/13/2021   Wound infection after surgery 12/12/2021   PAF (paroxysmal atrial fibrillation) (HCC)    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 11/26/2021   Peripheral arterial disease 11/26/2021   Chronic gouty arthritis 04/16/2019   Encounter for long-term (current) use of high-risk medication 04/16/2019   Stage 3b chronic kidney disease (CKD) (HCC) 03/05/2019   Hyperuricemia 03/05/2019   Cellulitis of left lower extremity 02/18/2019   Aortic atherosclerosis 04/10/2018   Class 3 obesity (HCC) 04/10/2018   CAD (coronary artery disease), native coronary artery 04/08/2018   COPD (chronic obstructive pulmonary disease) (HCC) 04/08/2018   Acute renal failure 04/08/2018   Hx of adenomatous colonic polyps 08/29/2016    Palliative Care Assessment & Plan    Recommendations/Plan: PMT will continue to follow   Code Status:    Code Status Orders  (From admission, onward)           Start     Ordered   06/10/24 1527  Do not attempt resuscitation (DNR) Pre-Arrest Interventions Desired  (Code Status)  Continuous       Question Answer Comment  If pulseless and not breathing No CPR or chest compressions.   In Pre-Arrest Conditions (Patient Has Pulse and Is Breathing) May intubate, use advanced airway interventions and cardioversion/ACLS medications if appropriate or indicated. May transfer to ICU.   Consent: Discussion documented in EHR or advanced directives reviewed      06/10/24 1526           Code Status History     Date Active Date Inactive Code Status Order ID Comments User Context   05/29/2024 1444 06/10/2024 1526 Full Code [id number]  Terrace Robet [facility]IG[facility]ERS ED   05/15/2024 1948 05/25/2024 0107 Full Code [id number]  Rust-Chester, Jenita CROME, NP ED   03/08/2024 2045 03/11/2024 2001 Full Code [id number]  Mansy, Jan A,  MD ED   08/08/2023 1242 08/08/2023 2131 Full Code [id number]  Serene Gaile ORN, MD Inpatient    09/09/2022 1624 09/13/2022 2230 Full Code [id number]  Cox, Amy N, DO ED   12/12/2021 2223 12/24/2021 2143 Full Code [id number]  Eliza Lonni RAMAN, MD ED   11/26/2021 1757 12/06/2021 2307 Full Code [id number]  Bethanie Donnice RIGGERS Inpatient   10/20/2021 1506 10/21/2021 0015 Full Code [id number]  Darron Deatrice LABOR, MD Inpatient   02/18/2019 0432 02/22/2019 1736 Full Code [id number]  Darl Jon DEL, NP ED       Prognosis:  Poor   Camelia Lewis, NP  Please contact Palliative Medicine Team phone at [phone] for questions and concerns.

## 2024-06-11 NOTE — Plan of Care (Signed)
  Problem: Education: Goal: Knowledge of General Education information will improve Description: Including pain rating scale, medication(s)/side effects and non-pharmacologic comfort measures Outcome: Progressing   Problem: Health Behavior/Discharge Planning: Goal: Ability to manage health-related needs will improve Outcome: Progressing   Problem: Clinical Measurements: Goal: Ability to maintain clinical measurements within normal limits will improve Outcome: Progressing Goal: Will remain free from infection Outcome: Progressing Goal: Diagnostic test results will improve Outcome: Progressing Goal: Respiratory complications will improve Outcome: Progressing   Problem: Activity: Goal: Risk for activity intolerance will decrease Outcome: Progressing   Problem: Nutrition: Goal: Adequate nutrition will be maintained Outcome: Progressing   Problem: Coping: Goal: Level of anxiety will decrease Outcome: Progressing   Problem: Skin Integrity: Goal: Risk for impaired skin integrity will decrease Outcome: Progressing

## 2024-06-11 NOTE — Plan of Care (Signed)
  Problem: Coping: Goal: Level of anxiety will decrease Outcome: Progressing   Problem: Safety: Goal: Ability to remain free from injury will improve Outcome: Progressing   Problem: Health Behavior/Discharge Planning: Goal: Ability to manage health-related needs will improve Outcome: Not Progressing   Problem: Clinical Measurements: Goal: Ability to maintain clinical measurements within normal limits will improve Outcome: Not Progressing Goal: Diagnostic test results will improve Outcome: Not Progressing   Problem: Activity: Goal: Risk for activity intolerance will decrease Outcome: Not Progressing   Problem: Nutrition: Goal: Adequate nutrition will be maintained Outcome: Not Progressing

## 2024-06-11 NOTE — Progress Notes (Signed)
  Received patient in bed to unit.   Informed consent signed and in chart.    TX duration:3.30     Transported by  Hand-off given to patient's nurse.    Access used: Right internal jugular cath  Access issues: none   Total UF removed: 1200 L unable to get 2L off d/t low bp.  Medication(s) given: Epo 10,000 u Post HD VS: wnl      GEANNIE Sar LPN [facility]

## 2024-06-11 NOTE — Progress Notes (Signed)
 NAME:  Kevin Gross, MRN:  [id number], DOB:  May 09, 1956, LOS: 13 ADMISSION DATE:  05/29/2024, CHIEF COMPLAINT:  Decompensated Liver Cirrhosis   History of Present Illness:   [name] is a 68 y.o. male who presented to [facility] ED via EMS from his home to be evaluated for hypotension and weakness. He reports his initial outpatient hemodialysis appointment was 05/28/2024 at [facility]. This initial appointment is following a recent admission at [facility], of which he was discharged on 05/24/24.   His overall weakness began yesterday following his dialysis appointment and has continued today, with the addition of upper URI symptoms to include minor congestion and a sore throat as of this morning. Upon EMS arrival, he was found to have a BP of 64/28, to which he received fluids and BP increased to 94/42. He reported a sore throat and generalized fatigue/weakness that resulted in a fall this morning. He also stated at baseline he has no issue ambulating with a walker.    Pertinent  Medical History  Alcohol abuse Liver Cirrhosis Hypertensin Hyperlipidemia Congestive Heart Failure End Stage Renal Disease  Significant Hospital Events: Including procedures, antibiotic start and stop dates in addition to other pertinent events   05/15/24: Admit patient to ICU due to circulatory shock s/t unclear etiology in the setting of AKI on CKD stage 4 requiring vasopressor support.  05/16/24: remains on pressors 05/17/24: No significant events overnight.  Afebrile, remains on Levophed  and Vasopressin  (Levo requirements much improved), Peritoneal fluid not consistent with SBP.  Plan for HD today at bedside.  9/22: Vital stable with some increase in creatinine, neurology ordered permanent HD catheter placement-likely be done tomorrow.  Patient will need outpatient dialysis chair before discharge. 9/23: Vital stable, slight worsening of leukocytosis, no obvious infection.  Having his complete session of dialysis  today, permanent HD catheter to be placed in the afternoon. 9/26: Discharged  10/1: Readmission with weakness and hypotension. Started on Levophed  in the ED following refractory hypotension. Midodrine  10mg  x 1. 1.5L fluids given without improvement. Transferred to the ICU for further management. 2L [place] started with improvement in O2.  10/2: developed GI bleed. Remains on levophed  and started on vaso overnight. Minimal oxygen requirements. Oliguric with plan for HD today at 11.  Abdominal Arterial Ultrasound with patent portal vein and hepatopetal flow (PORTAL VEIN THROMBOSIS RESOLVED), notes cirrhosis and moderate to large ascites.  10/3: No significant events overnight, nursing noted several small episodes of melena.  Hgb down to 6.9 this am, giving 1 unit pRBCs.  Remains on Levophed  and Vasopressin , start stress dose steroids and give Albumin .  Ultrasound yesterday negative for portal vein thrombosis, will give Vitamin K  for supratherapeutic INR.  Nephrology starting CRRT.  10/4: shock and on pressors  10/5: remains on pressors and CRRT  06/04/24-patient resting in bed comfortably. He remains hypotensive on vasopressors and dialysis.  06/05/24- EGD. there is trichomonal infection of pharynx and esophagus which may be seen with severe immunocompromised patients.  He is communicative.  He has oral mucosal sloughing and oral thrush as well.   06/06/24- patient on levophed  low dose, able to tolerate clear diet.  Has R internal jugular and subclavian central line. Severe thrush on EGD.  Trichomonas in throat and is being treated with ID specialist.  06/07/24- Patient is resting in bed in NAD.  Renal function with decrement in GFR overnight. Being treated for thrichomonal throat infection and thrush. Remains on vasopressor with PO midodrine  20mg  TID.  On zosyn , diflucan and  flagyl.  No family has been with patient all week thus far.   06/08/24- patient back on levophed  and vasopressin  infusion due to worsening  shock physiology. Paracentesis performed. 06/09/24- patient s/p paracentesis , no SBP.  Remains on vasopressors.  MELD 33. S/p rrt. 06/10/2024: awake and alert, lactic acid rose off pressors, restarted in PM 06/11/2024: Back on nor-epi, lactic acid improved  Interim History / Subjective:  Back on nor-epinephrine , no complaints, eating breakfast.  Objective    Blood pressure (!) 104/41, pulse 79, temperature 98.6 F (37 C), temperature source Oral, resp. rate 19, height 5' 9 (1.753 m), weight 99.7 kg, SpO2 99%.        Intake/Output Summary (Last 24 hours) at 06/11/2024 0747 Last data filed at 06/11/2024 0615 Gross per 24 hour  Intake 896.64 ml  Output 0 ml  Net 896.64 ml   Filed Weights   06/09/24 0500 06/10/24 0500 06/11/24 0500  Weight: 99.4 kg 99.7 kg 99.7 kg    Examination: Physical Exam Constitutional:      Appearance: Normal appearance. He is ill-appearing.  Cardiovascular:     Rate and Rhythm: Normal rate and regular rhythm.     Pulses: Normal pulses.     Heart sounds: Normal heart sounds.  Pulmonary:     Effort: Pulmonary effort is normal.     Breath sounds: No wheezing.  Abdominal:     General: There is distension.     Palpations: Abdomen is soft.  Neurological:     General: No focal deficit present.     Mental Status: He is alert and oriented to person, place, and time. Mental status is at baseline.      Assessment and Plan   #Circulatory Shock  #Sepsis #Acute Decompensated Liver Cirrhosis  #EtOH Liver Cirrhosis #ESLD #Trichomonas Esophagitis #Esophageal and Oropharyngeal Candidiasis #Portal Vein Thrombosis - resolved #ESRD on HD #GI Bleed #Protein Calorie Malnutrition #PAD  #Afib #HFpEF  Neuro - mental status at baseline. Has underlying liver cirrhosis, with ammonia previously elevated. Avoiding psychotropic medications as able. CV - Admitted with hypotension that is felt to be distributive. Sepsis is a possible etiology, though no source  identified on culture. He was found to have esophageal candidiasis and trichomonas, currently being treated with anti-microbials. SBP ruled out, and he has received broad spectrum antibiotics. Most recent echo 05/16/24 with normal RV/LV function, but with LV hypertrophy. I suspect his hypotension is secondary to vasoplegia due to liver cirrhosis, low oncotic pressure (malnutrition/liver disease), and renal failure. Also possible is adrenal insufficiency given previous AM cortisol last month was markedly depressed (repeat pending this AM). He did not tolerate liberal blood pressure requirements with rising lactic acid, and we've restarted nor-epi for goal MAP > 60 mmHg.   -resume nor-epinephrine , MAP > 60  -check AM cortisol Pulm - small pleural effusion that is stable and unchanged. He was treated with broad spectrum antibiotics. No active pulmonary problems identified at this point. GI - EtOH liver cirrhosis, with decompensation in the form of notable ascites. No varices noted. Recent portal vein thrombosis that likely further contributed to decompensation. Diet resumed. Overall poor prognosis given severity of liver disease; palliative care consulted, input appreciated.  MELD 3.0: 36 at 06/11/2024  4:25 AM MELD-Na: 37 at 06/11/2024  4:25 AM Calculated from: Serum Creatinine: 4.86 mg/dL (Using max of 3 mg/dL) at 89/85/7974  5:74 AM Serum Sodium: 133 mmol/L at 06/11/2024  4:25 AM Total Bilirubin: 8.5 mg/dL at 89/85/7974  5:74 AM Serum Albumin : 3 g/dL  at 06/11/2024  4:25 AM INR(ratio): 2.2 at 06/11/2024  4:25 AM Age at listing (hypothetical): 25 years Sex: Male at 06/11/2024  4:25 AM  Renal - ESRD currently on HD. Appreciate input from nephrology. Planned for another session of dialysis today. Endo - while he did have functional adrenal insufficiency during his prior admission, his AM cortisol level was normal this admit. This could have been confounded by the IV hydrocortisone he received 10/3-10/8.  Rechecking cortisol AM level today and will re-consider the diagnosis of adrenal insufficiency if remains depressed. Hem/Onc - portal vein thrombosis noted on previous admission, resolved on repeat imaging this admission. AC for PVT is in general a 6 month course, but given his frailty and GI bleed/anemia during this hospitalization this has been held. Would consider resuming if his H/H stabilize. Resumed DVT prophylaxis. ID - esophageal and oropharyngeal candidiasis, in addition to trichomonas (?tenax or pentatrichomas hominis per ID) in the esophageal brushing. On Fluconazole (14 to 21 days minimum) and Flagyl (10-14 days). Rest of cultures remain negative.  Labs   CBC: Recent Labs  Lab 06/07/24 0416 06/08/24 0417 06/09/24 0509 06/10/24 0422 06/11/24 0425  WBC 20.3* 29.0* 22.0* 21.7* 20.5*  NEUTROABS  --   --   --   --  17.5*  HGB 8.3* 9.1* 8.3* 8.0* 8.0*  HCT 26.8* 28.9* 26.1* 25.0* 24.4*  MCV 104.3* 103.2* 100.0 101.2* 98.8  PLT 80* 115* 81* 100* 109*    Basic Metabolic Panel: Recent Labs  Lab 06/06/24 1618 06/07/24 0416 06/08/24 0417 06/09/24 0509 06/10/24 0422 06/11/24 0425  NA 134* 133* 134* 134* 130* 133*  K 4.3 4.4 5.0 3.9 4.3 4.9  CL 101 102 99 96* 94* 97*  CO2 24 23 21* 26 22 22   GLUCOSE 147* 122* 110* 117* 88 87  BUN 41* 51* 76* 43* 66* 80*  CREATININE 1.97* 2.53* 3.88* 2.37* 3.74* 4.86*  CALCIUM  8.2* 8.4* 8.4* 8.4* 8.4* 8.7*  MG  --  2.7* 3.0* 2.1 2.7* 2.9*  PHOS 3.6 4.6 5.1* 3.5 5.6*  --    GFR: Estimated Creatinine Clearance: 17.4 mL/min (A) (by C-G formula based on SCr of 4.86 mg/dL (H)). Recent Labs  Lab 06/08/24 0417 06/09/24 0509 06/10/24 0422 06/10/24 1015 06/10/24 1315 06/10/24 1729 06/10/24 1947 06/11/24 0425  WBC 29.0* 22.0* 21.7*  --   --   --   --  20.5*  LATICACIDVEN  --   --   --    < > 3.6* 3.7* 3.2* 2.4*   < > = values in this interval not displayed.    Liver Function Tests: Recent Labs  Lab 06/07/24 0416 06/08/24 0417  06/09/24 0509 06/10/24 0422 06/11/24 0425  AST  --   --   --   --  213*  ALT  --   --   --   --  133*  ALKPHOS  --   --   --   --  136*  BILITOT  --   --   --   --  8.5*  PROT  --   --   --   --  5.1*  ALBUMIN  2.7* 2.3* 2.7* 3.1* 3.0*   No results for input(s): LIPASE, AMYLASE in the last 168 hours. No results for input(s): AMMONIA in the last 168 hours.  ABG    Component Value Date/Time   PHART 7.298 (L) 11/27/2021 0345   PCO2ART 39.7 11/27/2021 0345   PO2ART 116 (H) 11/27/2021 0345   HCO3 21.9 05/29/2024 2040  TCO2 21 (L) 08/08/2023 0916   ACIDBASEDEF 2.8 (H) 05/29/2024 2040   O2SAT 73.5 06/01/2024 1125     Coagulation Profile: Recent Labs  Lab 06/05/24 0520 06/08/24 1053 06/11/24 0425  INR 1.7* 1.7* 2.2*    Cardiac Enzymes: No results for input(s): CKTOTAL, CKMB, CKMBINDEX, TROPONINI in the last 168 hours.  HbA1C: Hgb A1c MFr Bld  Date/Time Value Ref Range Status  11/27/2021 03:28 AM 5.6 4.8 - 5.6 % Final    Comment:    (NOTE) Pre diabetes:          5.7%-6.4%  Diabetes:              >6.4%  Glycemic control for   <7.0% adults with diabetes     CBG: Recent Labs  Lab 06/10/24 1630 06/10/24 1915 06/10/24 2307 06/11/24 0308 06/11/24 0727  GLUCAP 85 92 89 78 80    Review of Systems:   N/A  Past Medical History:  He,  has a past medical history of Alcoholic cirrhosis of liver (HCC), Anemia (2024), Cataract (2024), CHF (congestive heart failure) (HCC), Chronic gouty arthritis, CKD (chronic kidney disease), stage III (HCC), Clotting disorder, Complication of anesthesia, COPD (chronic obstructive pulmonary disease) (HCC), Hyperlipidemia, Hypertension, Hyperuricemia, Peripheral arterial disease, Pre-diabetes, Prostate cancer (HCC) (2023), and Sleep apnea.   Surgical History:   Past Surgical History:  Procedure Laterality Date   ABDOMINAL AORTOGRAM W/LOWER EXTREMITY N/A 10/20/2021   Procedure: ABDOMINAL AORTOGRAM W/LOWER EXTREMITY;   Surgeon: Darron Deatrice LABOR, MD;  Location: [facility];  Service: Cardiovascular;  Laterality: N/A;   ABDOMINAL AORTOGRAM W/LOWER EXTREMITY N/A 08/08/2023   Procedure: ABDOMINAL AORTOGRAM W/LOWER EXTREMITY;  Surgeon: Serene Gaile ORN, MD;  Location: [facility];  Service: Cardiovascular;  Laterality: N/A;   ALLOGRAFT APPLICATION Bilateral 12/22/2021   Procedure: ALLOGRAFT APPLICATION;  Surgeon: Serene Gaile ORN, MD;  Location: [facility] OR;  Service: Vascular;  Laterality: Bilateral;   ANGIOPLASTY Left 11/26/2021   Procedure: LEFT INFRARENAL LITHOTRIPSY;  Surgeon: Serene Gaile ORN, MD;  Location: [facility] OR;  Service: Vascular;  Laterality: Left;   APPLICATION OF WOUND VAC Bilateral 12/13/2021   Procedure: APPLICATION OF WOUND VAC;  Surgeon: Lanis Fonda BRAVO, MD;  Location: [facility] OR;  Service: Vascular;  Laterality: Bilateral;   CARDIAC CATHETERIZATION     [facility]   CATARACT EXTRACTION W/PHACO Left 04/04/2023   Procedure: CATARACT EXTRACTION PHACO AND INTRAOCULAR LENS PLACEMENT (IOC) LEFT 8.71 00:45.5;  Surgeon: Jaye Fallow, MD;  Location: [facility];  Service: Ophthalmology;  Laterality: Left;   CATARACT EXTRACTION W/PHACO Right 04/18/2023   Procedure: CATARACT EXTRACTION PHACO AND INTRAOCULAR LENS PLACEMENT (IOC) RIGHT 4.31 00:8.2;  Surgeon: Jaye Fallow, MD;  Location: [facility];  Service: Ophthalmology;  Laterality: Right;   COLONOSCOPY     COLONOSCOPY WITH PROPOFOL  N/A 01/17/2018   Procedure: COLONOSCOPY WITH PROPOFOL ;  Surgeon: Toledo, Ladell POUR, MD;  Location: [facility] ENDOSCOPY;  Service: Gastroenterology;  Laterality: N/A;   COLONOSCOPY WITH PROPOFOL  N/A 09/12/2022   Procedure: COLONOSCOPY WITH PROPOFOL ;  Surgeon: Maryruth Ole DASEN, MD;  Location: [facility] ENDOSCOPY;  Service: Endoscopy;  Laterality: N/A;   COLONOSCOPY WITH PROPOFOL  N/A 09/05/2023   Procedure: COLONOSCOPY WITH PROPOFOL ;  Surgeon: Maryruth Ole DASEN, MD;  Location: [facility] ENDOSCOPY;  Service: Endoscopy;   Laterality: N/A;   DIALYSIS/PERMA CATHETER INSERTION Right 05/21/2024   Procedure: DIALYSIS/PERMA CATHETER INSERTION;  Surgeon: Jama Cordella MATSU, MD;  Location: [facility];  Service: Cardiovascular;  Laterality: Right;   ENDARTERECTOMY FEMORAL Bilateral 11/26/2021  Procedure: BILATERAL FEMORAL ENDARTERECTOMY WITH VEIN PATCH ANGIOPLASTY;  Surgeon: Serene Gaile ORN, MD;  Location: [facility];  Service: Vascular;  Laterality: Bilateral;   ENTEROSCOPY N/A 09/05/2023   Procedure: ENTEROSCOPY;  Surgeon: Maryruth Ole DASEN, MD;  Location: [facility] ENDOSCOPY;  Service: Endoscopy;  Laterality: N/A;   ESOPHAGOGASTRODUODENOSCOPY N/A 06/05/2024   Procedure: EGD (ESOPHAGOGASTRODUODENOSCOPY);  Surgeon: Therisa Bi, MD;  Location: [facility] ENDOSCOPY;  Service: Gastroenterology;  Laterality: N/A;  IC-10   ESOPHAGOGASTRODUODENOSCOPY (EGD) WITH PROPOFOL  N/A 01/17/2018   Procedure: ESOPHAGOGASTRODUODENOSCOPY (EGD) WITH PROPOFOL ;  Surgeon: Toledo, Ladell POUR, MD;  Location: [facility] ENDOSCOPY;  Service: Gastroenterology;  Laterality: N/A;   ESOPHAGOGASTRODUODENOSCOPY (EGD) WITH PROPOFOL  N/A 09/12/2022   Procedure: ESOPHAGOGASTRODUODENOSCOPY (EGD) WITH PROPOFOL ;  Surgeon: Maryruth Ole DASEN, MD;  Location: [facility] ENDOSCOPY;  Service: Endoscopy;  Laterality: N/A;   EYE SURGERY     GROIN DEBRIDEMENT Bilateral 12/16/2021   Procedure: IRRIGATION AND DEBRIDEMENT OF BILATERAL GROINS;  Surgeon: Serene Gaile ORN, MD;  Location: [facility];  Service: Vascular;  Laterality: Bilateral;   INCISION AND DRAINAGE OF WOUND Bilateral 12/13/2021   Procedure: IRRIGATION AND DEBRIDEMENT OF BILATERAL GROIN WOUNDS;  Surgeon: Lanis Fonda BRAVO, MD;  Location: [facility] OR;  Service: Vascular;  Laterality: Bilateral;   INSERTION OF ILIAC STENT Bilateral 11/26/2021   Procedure: BILATERAL ILIAC STENTING USING 77mmX80mm INNOVA STENT ON LEFT ILIAC AND 3mmX59mm, 24mmX79mm VBX AND 48mmX7.5cm VIABAHN STENT ON RIGHT ILIAC;  Surgeon: Serene Gaile ORN, MD;  Location: [facility] OR;   Service: Vascular;  Laterality: Bilateral;   INSERTION OF SEEDS IN PROSTATE     ORIF ANKLE FRACTURE Right    PERIPHERAL INTRAVASCULAR LITHOTRIPSY  08/08/2023   Procedure: PERIPHERAL INTRAVASCULAR LITHOTRIPSY;  Surgeon: Serene Gaile ORN, MD;  Location: [facility];  Service: Cardiovascular;;   PROSTATE BIOPSY N/A 04/01/2021   Procedure: PROSTATE BIOPSY GRAYCE;  Surgeon: Kassie Ozell SAUNDERS, MD;  Location: [facility] ORS;  Service: Urology;  Laterality: N/A;   VEIN HARVEST Bilateral 11/26/2021   Procedure: VEIN HARVEST OF BILATERAL SPAHENOUS VEINS;  Surgeon: Serene Gaile ORN, MD;  Location: [facility] OR;  Service: Vascular;  Laterality: Bilateral;   WOUND DEBRIDEMENT Bilateral 12/22/2021   Procedure: INCISION AND DEBRIDEMENT OF BILATERAL GROINS;  Surgeon: Serene Gaile ORN, MD;  Location: [facility] OR;  Service: Vascular;  Laterality: Bilateral;     Social History:   reports that he quit smoking about 9 years ago. His smoking use included cigarettes. He started smoking about 56 years ago. He has a 47 pack-year smoking history. He has never been exposed to tobacco smoke. He has never used smokeless tobacco. He reports that he does not currently use alcohol. He reports that he does not use drugs.   Family History:  His family history includes Heart disease in his brother and mother.   Allergies Allergies  Allergen Reactions   Jardiance [Empagliflozin]     Loss of balance, hypoglycemic      Home Medications  Prior to Admission medications   Medication Sig Start Date End Date Taking? Authorizing Provider  allopurinol  (ZYLOPRIM ) 100 MG tablet Take 150 mg by mouth daily.   Yes [provider]  amiodarone  (PACERONE ) 200 MG tablet Take 200 mg by mouth 2 (two) times daily.   Yes [provider]  APIXABAN  (ELIQUIS ) VTE STARTER PACK (10MG  AND 5MG ) Take as directed on package: start with two-5mg  tablets twice daily for 7 days. On day 8, switch to one-5mg  tablet twice daily. 05/24/24  Yes Jens Durand, MD  aspirin  EC 81  MG tablet Take 81 mg by mouth every morning.   Yes [provider]  benzonatate (TESSALON) 100 MG capsule Take 200 mg by mouth 3 (three) times daily as needed. 05/17/24  Yes [provider]  Cholecalciferol  (VITAMIN D3) 25 MCG (1000 UT) CAPS Take 2,000 Units by mouth daily.   Yes [provider]  ezetimibe  (ZETIA ) 10 MG tablet Take 1 tablet (10 mg total) by mouth daily. 04/10/18  Yes Gollan, Timothy J, MD  iron  polysaccharides (NIFEREX) 150 MG capsule Take 1 capsule (150 mg total) by mouth 2 (two) times daily. 03/11/24 06/09/24 Yes Sreenath, Sudheer B, MD  midodrine  (PROAMATINE ) 10 MG tablet Take 1 tablet (10 mg total) by mouth 3 (three) times daily with meals. 05/24/24  Yes Jens Durand, MD  pantoprazole  (PROTONIX ) 40 MG tablet Take 40 mg by mouth daily.   Yes [provider]  simvastatin  (ZOCOR ) 20 MG tablet Take 20 mg by mouth in the morning.   Yes [provider]  triamcinolone  ointment (KENALOG ) 0.1 % Apply 1 Application topically 2 (two) times daily as needed (psoriasis). 07/18/23  Yes [provider]  zolpidem  (AMBIEN ) 10 MG tablet Take 10 mg by mouth at bedtime.   Yes [provider]     The patient is critically ill due to circulatory shock, ESLD, ESRD.  Critical care was necessary to treat or prevent imminent or life-threatening deterioration. Critical care time was spent by me on the following activities: development of a treatment plan with the patient and/or surrogate as well as nursing, discussions with consultants, evaluation of the patient's response to treatment, examination of the patient, obtaining a history from the patient or surrogate, ordering and performing treatments and interventions, ordering and review of laboratory studies, ordering and review of radiographic studies, review of telemetry data including pulse oximetry, re-evaluation of patient's condition and participation in  multidisciplinary rounds.   I personally spent 40 minutes providing critical care not including any separately billable procedures.   Belva November, MD [facility] 06/11/2024 8:51 AM

## 2024-06-11 NOTE — Progress Notes (Signed)
 PHARMACY CONSULT NOTE - ELECTROLYTES  Pharmacy Consult for Electrolyte Monitoring and Replacement   Recent Labs: Height: 5' 9 (175.3 cm) Weight: 99.7 kg (219 lb 12.8 oz) (bed scale broken, previous weight documented) IBW/kg (Calculated) : 70.7 Estimated Creatinine Clearance: 17.4 mL/min (A) (by C-G formula based on SCr of 4.86 mg/dL (H)). Potassium (mmol/L)  Date Value  06/11/2024 4.9   Magnesium  (mg/dL)  Date Value  89/85/7974 2.9 (H)   Calcium  (mg/dL)  Date Value  89/85/7974 8.7 (L)   Albumin  (g/dL)  Date Value  89/85/7974 3.0 (L)   Phosphorus (mg/dL)  Date Value  89/86/7974 5.6 (H)   Sodium (mmol/L)  Date Value  06/11/2024 133 (L)  10/12/2021 145 (H)    Assessment  Kevin Gross is a 68 y.o. male presenting with septic shock. PMH significant for Portal vein thrombosis 9/17, CHF, ESRD on HD TTS, ETOH disorder, chronic bradycardia. Pharmacy has been consulted to monitor and replace electrolytes.  Fluids: Receiving Albumin  25% 50g infusion  Diet: Regular diet + Ensure Max protein supplement  Goal of Therapy: Electrolytes WNL  Plan:  Patient due for HD today No indication for replacement at this time Follow-up renal function panel + Mg with AM labs tomorrow   Thank you for allowing pharmacy to be a part of this patient's care.  Bernardino George, PharmD Candidate [phone] [employer] [employer] [facility] [employer] 06/11/2024 11:06 AM

## 2024-06-11 NOTE — Progress Notes (Signed)
 [facility]  ROUNDING NOTE   Subjective:   Kevin Gross  is a 68 y.o.  male  with past medical conditions including hypertension, COPD, obstructive sleep apnea with CPAP, anemia, and recent acute kidney injury requiring hemodialysis. Patient presents to ED with decreased blood pressure and has been admitted to ICU for Septic shock (HCC) [A41.9, R65.21]  Patient seen and evaluated at bedside. Alert and oriented.   Update: Patient remains on norepinephrine  drip this a.m. He will be due for hemodialysis treatment today. Resting comfortably in bed.   Objective:  Vital signs in last 24 hours:  Temp:  [97.7 F (36.5 C)-98.6 F (37 C)] 98.6 F (37 C) (10/14 0400) Pulse Rate:  [66-81] 79 (10/14 0700) Resp:  [12-23] 19 (10/14 0700) BP: (57-111)/(31-85) 104/41 (10/14 0700) SpO2:  [90 %-100 %] 99 % (10/14 0700) Weight:  [99.7 kg] 99.7 kg (10/14 0500)  Weight change: 0 kg Filed Weights   06/09/24 0500 06/10/24 0500 06/11/24 0500  Weight: 99.4 kg 99.7 kg 99.7 kg    Intake/Output: I/O last 3 completed shifts: In: 1125.5 [P.O.:480; I.V.:157.7; IV Piggyback:487.8] Out: 0    Intake/Output this shift:  No intake/output data recorded.  Physical Exam: General: NAD  Head: Normocephalic, atraumatic. Moist oral mucosal membranes  Eyes: Anicteric  Lungs:  Clear to auscultation, normal effort  Heart: Regular rate and rhythm  Abdomen:  Soft, nontender  Extremities: 1+ peripheral edema  Neurologic: Awake, alert  Skin: Generalized ecchymosis  Access: Right IJ PermCath    Basic Metabolic Panel: Recent Labs  Lab 06/06/24 1618 06/07/24 0416 06/08/24 0417 06/09/24 0509 06/10/24 0422 06/11/24 0425  NA 134* 133* 134* 134* 130* 133*  K 4.3 4.4 5.0 3.9 4.3 4.9  CL 101 102 99 96* 94* 97*  CO2 24 23 21* 26 22 22   GLUCOSE 147* 122* 110* 117* 88 87  BUN 41* 51* 76* 43* 66* 80*  CREATININE 1.97* 2.53* 3.88* 2.37* 3.74* 4.86*  CALCIUM  8.2* 8.4* 8.4* 8.4* 8.4* 8.7*  MG   --  2.7* 3.0* 2.1 2.7* 2.9*  PHOS 3.6 4.6 5.1* 3.5 5.6*  --     Liver Function Tests: Recent Labs  Lab 06/07/24 0416 06/08/24 0417 06/09/24 0509 06/10/24 0422 06/11/24 0425  AST  --   --   --   --  213*  ALT  --   --   --   --  133*  ALKPHOS  --   --   --   --  136*  BILITOT  --   --   --   --  8.5*  PROT  --   --   --   --  5.1*  ALBUMIN  2.7* 2.3* 2.7* 3.1* 3.0*   No results for input(s): LIPASE, AMYLASE in the last 168 hours. No results for input(s): AMMONIA in the last 168 hours.   CBC: Recent Labs  Lab 06/07/24 0416 06/08/24 0417 06/09/24 0509 06/10/24 0422 06/11/24 0425  WBC 20.3* 29.0* 22.0* 21.7* 20.5*  NEUTROABS  --   --   --   --  17.5*  HGB 8.3* 9.1* 8.3* 8.0* 8.0*  HCT 26.8* 28.9* 26.1* 25.0* 24.4*  MCV 104.3* 103.2* 100.0 101.2* 98.8  PLT 80* 115* 81* 100* 109*    Cardiac Enzymes: No results for input(s): CKTOTAL, CKMB, CKMBINDEX, TROPONINI in the last 168 hours.  BNP: Invalid input(s): POCBNP  CBG: Recent Labs  Lab 06/10/24 1630 06/10/24 1915 06/10/24 2307 06/11/24 0308 06/11/24 0727  GLUCAP 85  92 89 78 80      Microbiology: Results for orders placed or performed during the hospital encounter of 05/29/24  Resp panel by RT-PCR (RSV, Flu A&B, Covid) Anterior Nasal Swab     Status: None   Collection Time: 05/29/24 12:56 PM   Specimen: Anterior Nasal Swab  Result Value Ref Range Status   SARS Coronavirus 2 by RT PCR NEGATIVE NEGATIVE Final    Comment: (NOTE) SARS-CoV-2 target nucleic acids are NOT DETECTED.  The SARS-CoV-2 RNA is generally detectable in upper respiratory specimens during the acute phase of infection. The lowest concentration of SARS-CoV-2 viral copies this assay can detect is 138 copies/mL. A negative result does not preclude SARS-Cov-2 infection and should not be used as the sole basis for treatment or other patient management decisions. A negative result may occur with  improper specimen  collection/handling, submission of specimen other than nasopharyngeal swab, presence of viral mutation(s) within the areas targeted by this assay, and inadequate number of viral copies(<138 copies/mL). A negative result must be combined with clinical observations, patient history, and epidemiological information. The expected result is Negative.  Fact Sheet for Patients:  BloggerCourse.com  Fact Sheet for Healthcare Providers:  SeriousBroker.it  This test is no t yet approved or cleared by the [place]  FDA and  has been authorized for detection and/or diagnosis of SARS-CoV-2 by FDA under an Emergency Use Authorization (EUA). This EUA will remain  in effect (meaning this test can be used) for the duration of the COVID-19 declaration under Section 564(b)(1) of the Act, 21 U.S.C.section 360bbb-3(b)(1), unless the authorization is terminated  or revoked sooner.       Influenza A by PCR NEGATIVE NEGATIVE Final   Influenza B by PCR NEGATIVE NEGATIVE Final    Comment: (NOTE) The Xpert Xpress SARS-CoV-2/FLU/RSV plus assay is intended as an aid in the diagnosis of influenza from Nasopharyngeal swab specimens and should not be used as a sole basis for treatment. Nasal washings and aspirates are unacceptable for Xpert Xpress SARS-CoV-2/FLU/RSV testing.  Fact Sheet for Patients: BloggerCourse.com  Fact Sheet for Healthcare Providers: SeriousBroker.it  This test is not yet approved or cleared by the [place]  FDA and has been authorized for detection and/or diagnosis of SARS-CoV-2 by FDA under an Emergency Use Authorization (EUA). This EUA will remain in effect (meaning this test can be used) for the duration of the COVID-19 declaration under Section 564(b)(1) of the Act, 21 U.S.C. section 360bbb-3(b)(1), unless the authorization is terminated or revoked.     Resp Syncytial  Virus by PCR NEGATIVE NEGATIVE Final    Comment: (NOTE) Fact Sheet for Patients: BloggerCourse.com  Fact Sheet for Healthcare Providers: SeriousBroker.it  This test is not yet approved or cleared by the [place]  FDA and has been authorized for detection and/or diagnosis of SARS-CoV-2 by FDA under an Emergency Use Authorization (EUA). This EUA will remain in effect (meaning this test can be used) for the duration of the COVID-19 declaration under Section 564(b)(1) of the Act, 21 U.S.C. section 360bbb-3(b)(1), unless the authorization is terminated or revoked.  Performed at [facility], [address] [place], [place] [address]   Blood culture (routine x 2)     Status: None   Collection Time: 05/29/24 12:56 PM   Specimen: BLOOD  Result Value Ref Range Status   Specimen Description BLOOD RIGHT ANTECUBITAL  Final   Special Requests   Final    BOTTLES DRAWN AEROBIC AND ANAEROBIC Blood Culture results may not be  optimal due to an inadequate volume of blood received in culture bottles   Culture   Final    NO GROWTH 5 DAYS Performed at [facility], [address] [place], [place] [address]    Report Status 06/03/2024 FINAL  Final  Blood culture (routine x 2)     Status: None   Collection Time: 05/29/24 12:56 PM   Specimen: BLOOD  Result Value Ref Range Status   Specimen Description BLOOD LEFT ANTECUBITAL  Final   Special Requests   Final    BOTTLES DRAWN AEROBIC AND ANAEROBIC Blood Culture results may not be optimal due to an inadequate volume of blood received in culture bottles   Culture   Final    NO GROWTH 5 DAYS Performed at [facility], [address], [place], [place] [address]    Report Status 06/03/2024 FINAL  Final  MRSA Next Gen by PCR, Nasal     Status: None   Collection Time: 05/29/24  2:58 PM   Specimen: Nasal Mucosa; Nasal Swab  Result Value Ref Range Status   MRSA by PCR  Next Gen NOT DETECTED NOT DETECTED Final    Comment: (NOTE) The GeneXpert MRSA Assay (FDA approved for NASAL specimens only), is one component of a comprehensive MRSA colonization surveillance program. It is not intended to diagnose MRSA infection nor to guide or monitor treatment for MRSA infections. Test performance is not FDA approved in patients less than 62 years old. Performed at [facility], [address] [place], [place] [address]   Group A Strep by PCR     Status: None   Collection Time: 05/29/24  2:58 PM   Specimen: Ascities; Sterile Swab  Result Value Ref Range Status   Group A Strep by PCR NOT DETECTED NOT DETECTED Final    Comment: Performed at [facility], [address] [place], [place] [address]  Respiratory (~20 pathogens) panel by PCR     Status: None   Collection Time: 05/29/24  3:00 PM  Result Value Ref Range Status   Adenovirus NOT DETECTED NOT DETECTED Final   Coronavirus 229E NOT DETECTED NOT DETECTED Final    Comment: (NOTE) The Coronavirus on the Respiratory Panel, DOES NOT test for the novel  Coronavirus (2019 nCoV)    Coronavirus HKU1 NOT DETECTED NOT DETECTED Final   Coronavirus NL63 NOT DETECTED NOT DETECTED Final   Coronavirus OC43 NOT DETECTED NOT DETECTED Final   Metapneumovirus NOT DETECTED NOT DETECTED Final   Rhinovirus / Enterovirus NOT DETECTED NOT DETECTED Final   Influenza A NOT DETECTED NOT DETECTED Final   Influenza B NOT DETECTED NOT DETECTED Final   Parainfluenza Virus 1 NOT DETECTED NOT DETECTED Final   Parainfluenza Virus 2 NOT DETECTED NOT DETECTED Final   Parainfluenza Virus 3 NOT DETECTED NOT DETECTED Final   Parainfluenza Virus 4 NOT DETECTED NOT DETECTED Final   Respiratory Syncytial Virus NOT DETECTED NOT DETECTED Final   Bordetella pertussis NOT DETECTED NOT DETECTED Final   Bordetella Parapertussis NOT DETECTED NOT DETECTED Final   Chlamydophila pneumoniae NOT DETECTED NOT DETECTED Final   Mycoplasma  pneumoniae NOT DETECTED NOT DETECTED Final    Comment: Performed at [facility], [address], [place], [place] [address]  MRSA Next Gen by PCR, Nasal     Status: None   Collection Time: 05/29/24  3:50 PM   Specimen: Nasal Mucosa; Nasal Swab  Result Value Ref Range Status   MRSA by PCR Next Gen  NOT DETECTED NOT DETECTED Final    Comment: (NOTE) The GeneXpert MRSA Assay (FDA approved for NASAL specimens only), is one component of a comprehensive MRSA colonization surveillance program. It is not intended to diagnose MRSA infection nor to guide or monitor treatment for MRSA infections. Test performance is not FDA approved in patients less than 40 years old. Performed at [facility], [address] [place], [place] [address]   KOH prep     Status: None   Collection Time: 06/05/24 10:00 AM   Specimen: Esophagus  Result Value Ref Range Status   Specimen Description ESOPHAGUS  Final   Special Requests Immunocompromised  Final   KOH Prep   Final    YEAST WITH PSEUDOHYPHAE RESULT CALLED TO, READ BACK BY AND VERIFIED WITH: NOTIFIED RACHEL VERDI, RN 1206 06/05/24 THAT TRICHOMONAS WAS ALSO SEEN ON SLIDE  GM Performed at [facility], [address] [place], [place] [address]    Report Status 06/05/2024 FINAL  Final  Gastrointestinal Panel by PCR , Stool     Status: None   Collection Time: 06/05/24  3:04 PM   Specimen: Stool  Result Value Ref Range Status   Campylobacter species NOT DETECTED NOT DETECTED Final   Plesimonas shigelloides NOT DETECTED NOT DETECTED Final   Salmonella species NOT DETECTED NOT DETECTED Final   Yersinia enterocolitica NOT DETECTED NOT DETECTED Final   Vibrio species NOT DETECTED NOT DETECTED Final   Vibrio cholerae NOT DETECTED NOT DETECTED Final   Enteroaggregative E coli (EAEC) NOT DETECTED NOT DETECTED Final   Enteropathogenic E coli (EPEC) NOT DETECTED NOT DETECTED Final   Enterotoxigenic E coli (ETEC) NOT DETECTED NOT DETECTED  Final   Shiga like toxin producing E coli (STEC) NOT DETECTED NOT DETECTED Final   Shigella/Enteroinvasive E coli (EIEC) NOT DETECTED NOT DETECTED Final   Cryptosporidium NOT DETECTED NOT DETECTED Final   Cyclospora cayetanensis NOT DETECTED NOT DETECTED Final   Entamoeba histolytica NOT DETECTED NOT DETECTED Final   Giardia lamblia NOT DETECTED NOT DETECTED Final   Adenovirus F40/41 NOT DETECTED NOT DETECTED Final   Astrovirus NOT DETECTED NOT DETECTED Final   Norovirus GI/GII NOT DETECTED NOT DETECTED Final   Rotavirus A NOT DETECTED NOT DETECTED Final   Sapovirus (I, II, IV, and V) NOT DETECTED NOT DETECTED Final    Comment: Performed at [facility], [address] [place], [place] [address]  Body fluid culture w Gram Stain     Status: None (Preliminary result)   Collection Time: 06/08/24  1:57 PM   Specimen: Peritoneal Washings; Body Fluid  Result Value Ref Range Status   Specimen Description   Final    PERITONEAL Performed at [facility], [address] [place], [place] [address]    Special Requests   Final    NONE Performed at [facility], [address] [place], [place] [address]    Gram Stain NO WBC SEEN NO ORGANISMS SEEN   Final   Culture   Final    NO GROWTH 2 DAYS Performed at [facility], [address] [place], [place] [address]    Report Status PENDING  Incomplete    Coagulation Studies: Recent Labs    06/08/24 1053 06/11/24 0425  LABPROT 21.2* 26.0*  INR 1.7* 2.2*      Urinalysis: No results for input(s): COLORURINE, LABSPEC, PHURINE, GLUCOSEU, HGBUR, BILIRUBINUR, KETONESUR, PROTEINUR, UROBILINOGEN, NITRITE, LEUKOCYTESUR in the last 72 hours.  Invalid input(s): APPERANCEUR  Imaging: No results found.       Medications:    metronidazole Stopped (06/10/24 2247)   norepinephrine  (LEVOPHED ) Adult infusion 8 mcg/min (06/11/24 0615)     vitamin C   500 mg Oral BID    Chlorhexidine  Gluconate Cloth  6 each Topical Daily   epoetin  alfa-epbx (RETACRIT ) injection  10,000 Units Subcutaneous Q T,Th,Sat-1800   fluconazole  200 mg Oral QHS   heparin  injection (subcutaneous)  5,000 Units Subcutaneous Q12H   magic mouthwash w/lidocaine   10 mL Oral TID   midodrine   20 mg Oral TID WC   multivitamin  1 tablet Oral QHS   pantoprazole  (PROTONIX ) IV  40 mg Intravenous Q12H   Ensure Max Protein  11 oz Oral BID   alteplase , alteplase , docusate sodium , heparin , heparin , mouth rinse, polyethylene glycol  Assessment/ Plan:  Kevin Gross is a 68 y.o.  male  with past medical conditions including hypertension, COPD, obstructive sleep apnea with CPAP, anemia, and chronic kidney disease stage IV, who was admitted to [facility] on 05/29/2024 for Septic shock (HCC) [A41.9, R65.21]  [facility]/TTS/RT permcath  Acute kidney injury on chronic kidney disease stage IV with Volume overload Baseline creatinine 2.77 with GFR 24 06/30/2024. CT abd/pelvis shows dense lesion on left kidney.  CRRT stopped 06/06/2024. Patient will be due for hemodialysis treatment today.  Monitor blood pressure closely during treatment as he has hypotension.     Lab Results  Component Value Date   CREATININE 4.86 (H) 06/11/2024   CREATININE 3.74 (H) 06/10/2024   CREATININE 2.37 (H) 06/09/2024    Intake/Output Summary (Last 24 hours) at 06/11/2024 0748 Last data filed at 06/11/2024 0615 Gross per 24 hour  Intake 896.64 ml  Output 0 ml  Net 896.64 ml   2. Hypotension likely due to shock secondary to liver cirrhosis.  Patient off of vasopressin  and remains on norepinephrine .  Monitor blood pressure during dialysis treatment.  3. Anemia of chronic kidney disease Lab Results  Component Value Date   HGB 8.0 (L) 06/11/2024  Patient has received multiple blood products during this admission.  Continue Retacrit  10,000 units IV with dialysis treatments.  4.  Secondary Hyperparathyroidism: with  outpatient labs: PTH 52.7, phosphorus 4.1, calcium  8.6 on 7/2.  Lab Results  Component Value Date   CALCIUM  8.7 (L) 06/11/2024   CAION 1.21 08/08/2023   PHOS 5.6 (H) 06/10/2024  Phosphorus 5.6 yesterday.  Should come down with ongoing dialysis treatments.   LOS: 13 Kesler Wickham 10/14/20257:48 AM

## 2024-06-12 ENCOUNTER — Inpatient Hospital Stay

## 2024-06-12 DIAGNOSIS — R6521 Severe sepsis with septic shock: Secondary | ICD-10-CM | POA: Diagnosis not present

## 2024-06-12 DIAGNOSIS — A419 Sepsis, unspecified organism: Secondary | ICD-10-CM | POA: Diagnosis not present

## 2024-06-12 DIAGNOSIS — Z7189 Other specified counseling: Secondary | ICD-10-CM | POA: Diagnosis not present

## 2024-06-12 LAB — CBC WITH DIFFERENTIAL/PLATELET
Abs Immature Granulocytes: 0.25 K/uL — ABNORMAL HIGH (ref 0.00–0.07)
Basophils Absolute: 0 K/uL (ref 0.0–0.1)
Basophils Relative: 0 %
Eosinophils Absolute: 0.1 K/uL (ref 0.0–0.5)
Eosinophils Relative: 0 %
HCT: 27.3 % — ABNORMAL LOW (ref 39.0–52.0)
Hemoglobin: 8.8 g/dL — ABNORMAL LOW (ref 13.0–17.0)
Immature Granulocytes: 1 %
Lymphocytes Relative: 4 %
Lymphs Abs: 1.1 K/uL (ref 0.7–4.0)
MCH: 31.8 pg (ref 26.0–34.0)
MCHC: 32.2 g/dL (ref 30.0–36.0)
MCV: 98.6 fL (ref 80.0–100.0)
Monocytes Absolute: 1.5 K/uL — ABNORMAL HIGH (ref 0.1–1.0)
Monocytes Relative: 6 %
Neutro Abs: 23.6 K/uL — ABNORMAL HIGH (ref 1.7–7.7)
Neutrophils Relative %: 89 %
Platelets: 120 K/uL — ABNORMAL LOW (ref 150–400)
RBC: 2.77 MIL/uL — ABNORMAL LOW (ref 4.22–5.81)
RDW: 27.4 % — ABNORMAL HIGH (ref 11.5–15.5)
Smear Review: NORMAL
WBC: 26.6 K/uL — ABNORMAL HIGH (ref 4.0–10.5)
nRBC: 1.1 % — ABNORMAL HIGH (ref 0.0–0.2)

## 2024-06-12 LAB — BLOOD GAS, VENOUS
Acid-base deficit: 6 mmol/L — ABNORMAL HIGH (ref 0.0–2.0)
Bicarbonate: 18.8 mmol/L — ABNORMAL LOW (ref 20.0–28.0)
FIO2: 70 %
O2 Content: 45 L/min
O2 Saturation: 93.1 %
Patient temperature: 37
pCO2, Ven: 34 mmHg — ABNORMAL LOW (ref 44–60)
pH, Ven: 7.35 (ref 7.25–7.43)
pO2, Ven: 60 mmHg — ABNORMAL HIGH (ref 32–45)

## 2024-06-12 LAB — COMPREHENSIVE METABOLIC PANEL WITH GFR
ALT: 146 U/L — ABNORMAL HIGH (ref 0–44)
ALT: 155 U/L — ABNORMAL HIGH (ref 0–44)
AST: 244 U/L — ABNORMAL HIGH (ref 15–41)
AST: 275 U/L — ABNORMAL HIGH (ref 15–41)
Albumin: 3.6 g/dL (ref 3.5–5.0)
Albumin: 2.9 g/dL — ABNORMAL LOW (ref 3.5–5.0)
Alkaline Phosphatase: 159 U/L — ABNORMAL HIGH (ref 38–126)
Alkaline Phosphatase: 152 U/L — ABNORMAL HIGH (ref 38–126)
Anion gap: 14 (ref 5–15)
Anion gap: 22 — ABNORMAL HIGH (ref 5–15)
BUN: 47 mg/dL — ABNORMAL HIGH (ref 8–23)
BUN: 56 mg/dL — ABNORMAL HIGH (ref 8–23)
CO2: 22 mmol/L (ref 22–32)
CO2: 16 mmol/L — ABNORMAL LOW (ref 22–32)
Calcium: 8.8 mg/dL — ABNORMAL LOW (ref 8.9–10.3)
Calcium: 8.4 mg/dL — ABNORMAL LOW (ref 8.9–10.3)
Chloride: 96 mmol/L — ABNORMAL LOW (ref 98–111)
Chloride: 94 mmol/L — ABNORMAL LOW (ref 98–111)
Creatinine, Ser: 2.8 mg/dL — ABNORMAL HIGH (ref 0.61–1.24)
Creatinine, Ser: 3.5 mg/dL — ABNORMAL HIGH (ref 0.61–1.24)
GFR, Estimated: 24 mL/min — ABNORMAL LOW (ref >60)
GFR, Estimated: 18 mL/min — ABNORMAL LOW (ref >60)
Glucose, Bld: 91 mg/dL (ref 70–99)
Glucose, Bld: 123 mg/dL — ABNORMAL HIGH (ref 70–99)
Potassium: 4.1 mmol/L (ref 3.5–5.1)
Potassium: 4.4 mmol/L (ref 3.5–5.1)
Sodium: 132 mmol/L — ABNORMAL LOW (ref 135–145)
Sodium: 132 mmol/L — ABNORMAL LOW (ref 135–145)
Total Bilirubin: 11 mg/dL — ABNORMAL HIGH (ref 0.0–1.2)
Total Bilirubin: 11.6 mg/dL — ABNORMAL HIGH (ref 0.0–1.2)
Total Protein: 5.8 g/dL — ABNORMAL LOW (ref 6.5–8.1)
Total Protein: 5.2 g/dL — ABNORMAL LOW (ref 6.5–8.1)

## 2024-06-12 LAB — PROTIME-INR
INR: 2.6 — ABNORMAL HIGH (ref 0.8–1.2)
Prothrombin Time: 28.8 s — ABNORMAL HIGH (ref 11.4–15.2)

## 2024-06-12 LAB — MAGNESIUM: Magnesium: 2.7 mg/dL — ABNORMAL HIGH (ref 1.7–2.4)

## 2024-06-12 LAB — GLUCOSE, CAPILLARY
Glucose-Capillary: 88 mg/dL (ref 70–99)
Glucose-Capillary: 88 mg/dL (ref 70–99)
Glucose-Capillary: 79 mg/dL (ref 70–99)
Glucose-Capillary: 67 mg/dL — ABNORMAL LOW (ref 70–99)
Glucose-Capillary: 93 mg/dL (ref 70–99)

## 2024-06-12 LAB — APTT: aPTT: 61 s — ABNORMAL HIGH (ref 24–36)

## 2024-06-12 LAB — PHOSPHORUS: Phosphorus: 4.5 mg/dL (ref 2.5–4.6)

## 2024-06-12 MED ORDER — PIPERACILLIN-TAZOBACTAM IN DEX 2-0.25 GM/50ML IV SOLN
2.2500 g | Freq: Three times a day (TID) | INTRAVENOUS | Status: DC
  Administered 2024-06-12 – 2024-06-13 (×2): 2.25 g via INTRAVENOUS
  Filled 2024-06-12 (×3): qty 50

## 2024-06-12 MED ORDER — VASOPRESSIN 20 UNITS/100 ML INFUSION FOR SHOCK
0.0000 [IU]/min | INTRAVENOUS | Status: DC
  Administered 2024-06-12: 0.04 [IU]/min via INTRAVENOUS
  Administered 2024-06-12 (×2): 0.03 [IU]/min via INTRAVENOUS
  Administered 2024-06-13: 0.04 [IU]/min via INTRAVENOUS
  Filled 2024-06-12 (×6): qty 100

## 2024-06-12 MED ORDER — PANTOPRAZOLE SODIUM 40 MG PO TBEC
40.0000 mg | DELAYED_RELEASE_TABLET | Freq: Two times a day (BID) | ORAL | Status: DC
  Administered 2024-06-12: 40 mg via ORAL
  Filled 2024-06-12: qty 1

## 2024-06-12 MED ORDER — VANCOMYCIN HCL 2000 MG/400ML IV SOLN
2000.0000 mg | Freq: Once | INTRAVENOUS | Status: AC
  Administered 2024-06-12: 2000 mg via INTRAVENOUS
  Filled 2024-06-12: qty 400

## 2024-06-12 MED ORDER — PIPERACILLIN-TAZOBACTAM 3.375 G IVPB
3.3750 g | Freq: Three times a day (TID) | INTRAVENOUS | Status: DC
  Administered 2024-06-12: 3.375 g via INTRAVENOUS
  Filled 2024-06-12: qty 50

## 2024-06-12 MED ORDER — ALBUMIN HUMAN 25 % IV SOLN
12.5000 g | Freq: Two times a day (BID) | INTRAVENOUS | Status: DC
Start: 2024-06-12 — End: 2024-06-13
  Administered 2024-06-12 (×2): 12.5 g via INTRAVENOUS
  Filled 2024-06-12 (×2): qty 50

## 2024-06-12 MED ORDER — DEXTROSE 50 % IV SOLN
12.5000 g | Freq: Once | INTRAVENOUS | Status: AC
  Administered 2024-06-12: 12.5 g via INTRAVENOUS
  Filled 2024-06-12: qty 50

## 2024-06-12 MED ORDER — MORPHINE SULFATE (PF) 2 MG/ML IV SOLN
2.0000 mg | INTRAVENOUS | Status: DC | PRN
  Administered 2024-06-13: 2 mg via INTRAVENOUS
  Filled 2024-06-12: qty 1

## 2024-06-12 NOTE — Progress Notes (Signed)
 Nutrition Follow Up Note   DOCUMENTATION CODES:   Obesity unspecified  INTERVENTION:   Once dobhoff tube in place, recommend:   Osmolite 1.5@70ml /hr- Initiate at 79ml/hr and increase by 10ml/hr q 8 hours until goal rate is reached.   ProSource TF 20- Give 60ml daily via tube, each supplement provides 80kcal and 20g of protein.   Free water  flushes 30ml q4 hours to maintain tube patency   Regimen provides 2600kcal/day, 125g/day protein and 1436ml/day of free water .   Pt at high refeed risk; recommend monitor potassium, magnesium  and phosphorus labs daily until stable  Vitamin C  500mg  BID via tube   Thiamine  100mg  daily via tube x 7 days  Daily weights   NUTRITION DIAGNOSIS:   Increased nutrient needs related to chronic illness (ESRD on HD) as evidenced by estimated needs. -ongoing   GOAL:   Patient will meet greater than or equal to 90% of their needs -not met   MONITOR:   PO intake, Supplement acceptance, Labs, Weight trends, TF tolerance, I & O's, Skin  ASSESSMENT:   68 y/o male with h/o COPD, PAD, CAD, etoh abuse, cirrhosis, OSA, diverticulosis, hemorrhoids, IDA, HLD, CHF, PAF, PUD (with duodenal ulcer and angiodysplasia), prostate cancer, gout and recent admission for CHF, shock, bradycardia and AKI with progression to ESRD s/p new HD 9/18 and who is now admitted with weakness and congestion and was found to have CHF, sepsis, candidiasis & trichomonas of esophagus and throat, shock and melena.  Met with pt in room today. Pt reports that he is feeling worse. Pt reports that his appetite is poor and reports that he has no desire to eat. Pt did eat a bowl of oatmeal for breakfast. Pt has had poor oral intake since admit; pt seems to do better at breakfast than the rest of the day. RD discussed with pt about his nutritional status today. Pt has now been without adequate nutrition for 2 weeks. Pt is unsure of his GOC currently and is agreeable to proceed with dobhoff tube  placement and nutrition support. Pt would never want a PEG tube. RD will initiate tube feeds once dobhoff tube in place. Pt is at high refeed risk. Pt with poor prognosis; palliative care is following.   Per chart, pt is down ~30lbs since admission. Pt was admitted with significant edema which has improved. Pt with new muscle depletions noted on exam today. Pt remains icteric. Pt -12.7L on his I & Os. Next HD scheduled for tomorrow.    Medications reviewed and include: vitamin C , epoetin , solu-cortef, midodrine , rena-vite, protonix , thiamine , zosyn , vasopressin     Labs reviewed: K 4.0 wnl, BUN 37(H), creat 1.93(H), P 3.3 wnl, Mg 2.6(H) Wbc- 25.1(H), Hgb 7.9(L), Hct 25.0(L) Iron  60, TIBC 245(L), ferritin 209- 8/18  Nutrition Focused Physical Exam:  Flowsheet Row Most Recent Value  Orbital Region No depletion  Upper Arm Region Unable to assess  Thoracic and Lumbar Region No depletion  Buccal Region No depletion  Temple Region No depletion  Clavicle Bone Region Moderate depletion  Clavicle and Acromion Bone Region Moderate depletion  Scapular Bone Region No depletion  Dorsal Hand Unable to assess  Patellar Region Moderate depletion  Anterior Thigh Region Moderate depletion  Posterior Calf Region Moderate depletion  Edema (RD Assessment) Moderate  [BUE]  Hair Reviewed  Eyes Reviewed  Mouth Reviewed  Skin Reviewed  Nails Reviewed   Diet Order:   Diet Order             Diet regular  Room service appropriate? Yes; Fluid consistency: Thin; Fluid restriction: 2000 mL Fluid  Diet effective now                  EDUCATION NEEDS:   Education needs have been addressed  Skin:  Skin Assessment: Reviewed RN Assessment (DTI sacrum and coccyx, partial thickness skin loss L arm, L leg wound)  Last BM:  10/12- type 5  Height:   Ht Readings from Last 1 Encounters:  05/29/24 5' 9 (1.753 m)    Weight:   Wt Readings from Last 1 Encounters:  06/11/24 99.7 kg    Ideal Body  Weight:  72.7 kg  BMI:  Body mass index is 32.46 kg/m.  Estimated Nutritional Needs:   Kcal:  2300-2600kcal/day  Protein:  115-130g/day  Fluid:  1.9-2.2L/day  Augustin Shams MS, RD, LDN If unable to be reached, please send secure chat to RD inpatient available from 8:00a-4:00p daily

## 2024-06-12 NOTE — TOC Progression Note (Signed)
 Transition of Care [facility]) - Progression Note    Patient Details  Name: Kevin Gross MRN: [id number] Date of Birth: 01/25/1956  [facility] CM/SW Contact  [place], [place] Phone Number: 06/12/2024, 10:58 AM  Clinical Narrative:     Rounding in ICU. Patient is high risk for admissions. Liver function is declining.   Expected Discharge Plan: Home w Home Health Services Barriers to Discharge: Continued Medical Work up               Expected Discharge Plan and Services       Living arrangements for the past 2 months: Single Family Home                           HH Arranged: PT, OT [facility] Agency: [facility] Date [facility] Agency Contacted: 06/03/24   Representative spoke with at [facility] Agency: [place]   Social Drivers of Health (SDOH) Interventions SDOH Screenings   Food Insecurity: No Food Insecurity (05/29/2024)  Housing: Low Risk  (05/29/2024)  Transportation Needs: No Transportation Needs (05/29/2024)  Utilities: Not At Risk (05/29/2024)  Depression (PHQ2-9): Low Risk  (03/14/2024)  Financial Resource Strain: Low Risk  (07/17/2023)  Social Connections: Moderately Isolated (05/29/2024)  Tobacco Use: Medium Risk (06/05/2024)    Readmission Risk Interventions    06/03/2024   11:26 AM 05/16/2024    1:46 PM 09/13/2022   11:09 AM  Readmission Risk Prevention Plan  Transportation Screening Complete Complete Complete  PCP or Specialist Appt within 3-5 Days  Complete   Home Care Screening   Patient refused  Medication Review (RN CM)   Complete  Palliative Care Screening  Not Applicable   Medication Review (RN Care Manager) Complete Complete   PCP or Specialist appointment within 3-5 days of discharge Complete    HRI or Home Care Consult Complete    SW Recovery Care/Counseling Consult Complete    Palliative Care Screening Not Applicable    Skilled Nursing Facility Not Applicable

## 2024-06-12 NOTE — Progress Notes (Signed)
 Physical Therapy Treatment Patient Details Name: Kevin Gross MRN: [id number] DOB: 04-29-1956 Today's Date: 06/12/2024   History of Present Illness 68 y.o. male who presented to [facility] ED via EMS from his home to be evaluated for hypotension and weakness. He reports his initial outpatient hemodialysis appointment was 05/28/2024 at [facility]. This initial appointment is following a recent admission at [facility], of which he was discharged on 05/24/24.     Weakness began following his dialysis appointment, pt was recently here with septic shock.  PMH: alcoholic cirrhosis of liver, CHF, COPD, HLD, HTN, stage III CKD, gout, anemia, PVD, OSA on CPAP.    PT Comments  Pt seen for PT tx with pt agreeable to tx. Session focused on upright tolerance, strengthening. Increased HOB & lowered foot of bed to progress to chair position with pt c/o dizziness. Pt performs BUE/BLE strengthening exercises with AAROM, rest breaks in between. Pt willing to participate, but notes 10/10 fatigue after minimal exercises. Will continue to follow pt acutely to progress mobility as able.  BP in RUE: 100/46 (63) 99/48 (61) c/o dizzy 101/44(62)  BP in LUE:  86/48 (59)   Notified nurse of pt's c/o dizziness, ongoing low BP & SpO2 84-89% on supplemental O2.   If plan is discharge home, recommend the following: Two people to help with walking and/or transfers;Two people to help with bathing/dressing/bathroom   Can travel by private vehicle     No  Equipment Recommendations  Rolling walker (2 wheels);BSC/3in1;Hospital bed;Wheelchair (measurements PT);Wheelchair cushion (measurements PT) (air mattress overlay)    Recommendations for Other Services       Precautions / Restrictions Precautions Precautions: Fall Recall of Precautions/Restrictions: Intact Precaution/Restrictions Comments: watch BP, O2 Restrictions Weight Bearing Restrictions Per Provider Order: No     Mobility  Bed Mobility                     Transfers                        Ambulation/Gait                   Stairs             Wheelchair Mobility     Tilt Bed    Modified Rankin (Stroke Patients Only)       Balance                                            Communication Communication Factors Affecting Communication: Reduced clarity of speech (low volume)  Cognition Arousal: Alert Behavior During Therapy: WFL for tasks assessed/performed   PT - Cognitive impairments: No apparent impairments                       PT - Cognition Comments: pleasant man, continues to state I'll try to exercises/activity Following commands: Intact      Cueing Cueing Techniques: Verbal cues  Exercises General Exercises - Lower Extremity Short Arc Quad: AAROM, Strengthening, Both, 10 reps Heel Slides: AAROM, Supine, Strengthening, Both, 10 reps    General Comments        Pertinent Vitals/Pain Pain Assessment Pain Assessment: Faces Pain Location: stomach Pain Descriptors / Indicators: Discomfort Pain Intervention(s): Monitored during session (notified nurse of pt's c/o pain)    Home Living  Prior Function            PT Goals (current goals can now be found in the care plan section) Acute Rehab PT Goals Patient Stated Goal: feel stronger and go home PT Goal Formulation: With patient Time For Goal Achievement: 06/19/24 Potential to Achieve Goals: Fair Progress towards PT goals: Progressing toward goals    Frequency    Min 3X/week      PT Plan      Co-evaluation PT/OT/SLP Co-Evaluation/Treatment: Yes Reason for Co-Treatment: For patient/therapist safety PT goals addressed during session: Strengthening/ROM        AM-PAC PT 6 Clicks Mobility   Outcome Measure  Help needed turning from your back to your side while in a flat bed without using bedrails?: A Lot Help needed moving from lying on your back  to sitting on the side of a flat bed without using bedrails?: Total Help needed moving to and from a bed to a chair (including a wheelchair)?: Total Help needed standing up from a chair using your arms (e.g., wheelchair or bedside chair)?: Total Help needed to walk in hospital room?: Total Help needed climbing 3-5 steps with a railing? : Total 6 Click Score: 7    End of Session Equipment Utilized During Treatment: Oxygen Activity Tolerance: Patient limited by fatigue Patient left: in bed;with call bell/phone within reach;with bed alarm set Nurse Communication:  (ongoing low BP, SPO2 levels) PT Visit Diagnosis: Muscle weakness (generalized) (M62.81);Difficulty in walking, not elsewhere classified (R26.2);Other abnormalities of gait and mobility (R26.89)     Time: 8640-8573 PT Time Calculation (min) (ACUTE ONLY): 27 min  Charges:    $Therapeutic Activity: 8-22 mins PT General Charges $$ ACUTE PT VISIT: 1 Visit                     Richerd Pinal, PT, DPT 06/12/24, 2:46 PM   Richerd CHRISTELLA Pinal 06/12/2024, 2:44 PM

## 2024-06-12 NOTE — IPAL (Signed)
  Interdisciplinary Goals of Care Family Meeting   Date carried out: 06/12/2024  Location of the meeting: Bedside  Member's involved: Nurse Practitioner, Family Member or next of kin, and Other: ICU PA  Durable Power of Attorney or acting medical decision maker: Patient; Patient's wife, daughter, and son-in-law.      Discussion: We discussed goals of care for Kevin Gross . Followed up goals of care conversation with pt, his spouse, daughter, and son-in-law.  Pt stated he wants to change his code status to DNI/DNR.  He also informed his family he is tired and DOES NOT WANT TO CONTINUE AGGRESSIVE TREATMENT.  Kevin Gross wants to transition to COMFORT MEASURES ONLY once he is able to get in contact with the rest of his children.  He will inform PCCM team when he's ready to transition to Comfort Measures Only.  He is agreeable to prn morphine  for pain and/or air hunger in the meantime.    Code status:   Code Status: Do not attempt resuscitation (DNR) PRE-ARREST INTERVENTIONS DESIRED   Disposition: Continue current acute care but will transition to COMFORT MEASURES ONLY once he is able to contact the rest of his children.  Time spent for the meeting: 30 minutes    Lonell Moose, [facility]  Pulmonary/Critical Care Pager [phone] (please enter 7 digits) PCCM Consult Pager [phone] (please enter 7 digits)

## 2024-06-12 NOTE — Plan of Care (Signed)
  Problem: Clinical Measurements: Goal: Respiratory complications will improve Outcome: Progressing Goal: Cardiovascular complication will be avoided Outcome: Progressing   Problem: Activity: Goal: Risk for activity intolerance will decrease Outcome: Progressing   Problem: Nutrition: Goal: Adequate nutrition will be maintained Outcome: Progressing   Problem: Coping: Goal: Level of anxiety will decrease Outcome: Progressing   Problem: Pain Managment: Goal: General experience of comfort will improve and/or be controlled Outcome: Progressing   Problem: Safety: Goal: Ability to remain free from injury will improve Outcome: Progressing   Problem: Clinical Measurements: Goal: Ability to maintain clinical measurements within normal limits will improve Outcome: Not Progressing Goal: Diagnostic test results will improve Outcome: Not Progressing

## 2024-06-12 NOTE — Progress Notes (Signed)
 After working with PT patient became hypotensive requiring levophed  to be tirtrated up from 12mcg to 18mcg.  Patient also hypoxic with 02 SATs 85-87% on 6L nasal cannula. Patient denies pain but complains of shortness of breath. Patient placed on 50% venturi mask with improved 02 SATs to 90%. Respiratory called. Dr. Isadora called to bedside and ordered patient to be placed on heated high flow.

## 2024-06-12 NOTE — Plan of Care (Signed)
  Problem: Clinical Measurements: Goal: Will remain free from infection Outcome: Progressing   Problem: Pain Managment: Goal: General experience of comfort will improve and/or be controlled Outcome: Progressing   Problem: Safety: Goal: Ability to remain free from injury will improve Outcome: Progressing   Problem: Clinical Measurements: Goal: Ability to maintain clinical measurements within normal limits will improve Outcome: Not Progressing Goal: Diagnostic test results will improve Outcome: Not Progressing Goal: Respiratory complications will improve Outcome: Not Progressing   Problem: Skin Integrity: Goal: Risk for impaired skin integrity will decrease Outcome: Not Progressing

## 2024-06-12 NOTE — IPAL (Signed)
  Interdisciplinary Goals of Care Family Meeting   Date carried out: 06/12/2024  Location of the meeting: Bedside  Member's involved: Physician and Family Member or next of kin  Durable Power of Attorney or acting medical decision maker: Patient; Patient's wife, daughter, and son in Social worker.    Discussion: We discussed goals of care for Curtistine JINNY Pounds.  I explained to the patient, his wife, his daughter, and his son-in-law the overall critical nature of his illness.  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  He is vasoplegic and malnourished with a persistently low blood pressure.  He has gotten worse over the past 24 to 48 hours with increased oxygen requirements and increased vasopressor requirements suggestive of an infectious process potentially a pneumonia or a bloodstream infection.  I explained to the patient and his family that his current state and severity of illness preclude him from being a liver transplant candidate.  I also explained that with his potentially worsening pneumonia, he is at risk for poor outcome should his breathing worsen to the point that he requires intubation and mechanical ventilation.  Explained that once intubated and mechanically ventilated, the likelihood of getting off the ventilator are minimal.  I explained that the combination of renal failure, respiratory failure, and liver failure with an infectious process/sepsis portends a very poor prognosis.  Patient is currently struggling to breathe with increased respiratory rate and increased secretions.  We also discussed dialysis modalities and explained the difference between continuous renal placement therapy and its indications versus that of hemodialysis.  Explained that patients cannot be on CRRT indefinitely.  I encouraged them to reconsider his overall goals of care and CODE  STATUS.  I encouraged him to consider DNI and not have the breathing tube placed as it would not change his overall outcomes. He has already requested a DNR status. Patient is having a conversation with his family members and they will let us  know.  Part of care is also involved in his care and will be communicating with him and with his daughter.  Code status:   Code Status: Do not attempt resuscitation (DNR) PRE-ARREST INTERVENTIONS DESIRED   Disposition: Continue current acute care  Time spent for the meeting: 20 minutes    Belva November, MD  06/12/2024, 6:36 PM

## 2024-06-12 NOTE — Progress Notes (Signed)
 NAME:  Kevin Gross, MRN:  [id number], DOB:  [birth year], LOS: 14 ADMISSION DATE:  05/29/2024, CHIEF COMPLAINT:  Decompensated Liver Cirrhosis   History of Present Illness:   Kevin Gross is a 68 y.o. male who presented to [facility] ED via EMS from his home to be evaluated for hypotension and weakness. He reports his initial outpatient hemodialysis appointment was 05/28/2024 at [facility]. This initial appointment is following a recent admission at [facility], of which he was discharged on 05/24/24.   His overall weakness began yesterday following his dialysis appointment and has continued today, with the addition of upper URI symptoms to include minor congestion and a sore throat as of this morning. Upon EMS arrival, he was found to have a BP of 64/28, to which he received fluids and BP increased to 94/42. He reported a sore throat and generalized fatigue/weakness that resulted in a fall this morning. He also stated at baseline he has no issue ambulating with a walker.    Pertinent  Medical History  Alcohol abuse Liver Cirrhosis Hypertensin Hyperlipidemia Congestive Heart Failure End Stage Renal Disease  Significant Hospital Events: Including procedures, antibiotic start and stop dates in addition to other pertinent events   05/15/24: Admit patient to ICU due to circulatory shock s/t unclear etiology in the setting of AKI on CKD stage 4 requiring vasopressor support.  05/16/24: remains on pressors 05/17/24: No significant events overnight.  Afebrile, remains on Levophed  and Vasopressin  (Levo requirements much improved), Peritoneal fluid not consistent with SBP.  Plan for HD today at bedside.  9/22: Vital stable with some increase in creatinine, neurology ordered permanent HD catheter placement-likely be done tomorrow.  Patient will need outpatient dialysis chair before discharge. 9/23: Vital stable, slight worsening of leukocytosis, no obvious infection.  Having his complete session of dialysis  today, permanent HD catheter to be placed in the afternoon. 9/26: Discharged  10/1: Readmission with weakness and hypotension. Started on Levophed  in the ED following refractory hypotension. Midodrine  10mg  x 1. 1.5L fluids given without improvement. Transferred to the ICU for further management. 2L [place] started with improvement in O2.  10/2: developed GI bleed. Remains on levophed  and started on vaso overnight. Minimal oxygen requirements. Oliguric with plan for HD today at 11.  Abdominal Arterial Ultrasound with patent portal vein and hepatopetal flow (PORTAL VEIN THROMBOSIS RESOLVED), notes cirrhosis and moderate to large ascites.  10/3: No significant events overnight, nursing noted several small episodes of melena.  Hgb down to 6.9 this am, giving 1 unit pRBCs.  Remains on Levophed  and Vasopressin , start stress dose steroids and give Albumin .  Ultrasound yesterday negative for portal vein thrombosis, will give Vitamin K  for supratherapeutic INR.  Nephrology starting CRRT.  10/4: shock and on pressors  10/5: remains on pressors and CRRT  06/04/24-patient resting in bed comfortably. He remains hypotensive on vasopressors and dialysis.  06/05/24- EGD. there is trichomonal infection of pharynx and esophagus which may be seen with severe immunocompromised patients.  He is communicative.  He has oral mucosal sloughing and oral thrush as well.   06/06/24- patient on levophed  low dose, able to tolerate clear diet.  Has R internal jugular and subclavian central line. Severe thrush on EGD.  Trichomonas in throat and is being treated with ID specialist.  06/07/24- Patient is resting in bed in NAD.  Renal function with decrement in GFR overnight. Being treated for thrichomonal throat infection and thrush. Remains on vasopressor with PO midodrine  20mg  TID.  On zosyn , diflucan and  flagyl.  No family has been with patient all week thus far.   06/08/24- patient back on levophed  and vasopressin  infusion due to worsening  shock physiology. Paracentesis performed. 06/09/24- patient s/p paracentesis , no SBP.  Remains on vasopressors.  MELD 33. S/p rrt. 06/10/2024: awake and alert, lactic acid rose off pressors, restarted in PM 06/11/2024: Back on nor-epi, lactic acid improved 06/12/2024: rising nor-epinephrine  requirements, back on vasopressin   Interim History / Subjective:   Rising vasopressor requirements  Objective    Blood pressure (!) 98/52, pulse (!) 103, temperature 98.8 F (37.1 C), temperature source Oral, resp. rate 20, height 5' 9 (1.753 m), weight 99.7 kg, SpO2 93%.        Intake/Output Summary (Last 24 hours) at 06/12/2024 0824 Last data filed at 06/12/2024 0600 Gross per 24 hour  Intake 387.45 ml  Output 1200 ml  Net -812.55 ml   Filed Weights   06/09/24 0500 06/10/24 0500 06/11/24 0500  Weight: 99.4 kg 99.7 kg 99.7 kg    Examination: Physical Exam Constitutional:      General: He is not in acute distress.    Appearance: He is ill-appearing.  Eyes:     General: Scleral icterus present.  Cardiovascular:     Rate and Rhythm: Normal rate and regular rhythm.     Pulses: Normal pulses.     Heart sounds: Normal heart sounds.  Pulmonary:     Effort: Pulmonary effort is normal.     Breath sounds: Normal breath sounds.  Abdominal:     General: There is distension.     Palpations: Abdomen is soft.     Tenderness: There is no abdominal tenderness.  Neurological:     General: No focal deficit present.     Mental Status: He is alert and oriented to person, place, and time. Mental status is at baseline.      Assessment and Plan   #Circulatory Shock  #Sepsis #Acute Decompensated Liver Cirrhosis  #EtOH Liver Cirrhosis #Acute Hypoxic Respiratory Failure #ESLD #Trichomonas Esophagitis #Esophageal and Oropharyngeal Candidiasis #Portal Vein Thrombosis - resolved #ESRD on HD #GI Bleed #Protein Calorie Malnutrition #PAD  #Afib #HFpEF  Neuro - mental status at baseline.  Has underlying liver cirrhosis, with ammonia previously elevated. Avoiding psychotropic medications as able.  CV - Admitted with hypotension that is felt to be distributive. Sepsis is a possible etiology, though no source identified on culture. He was found to have esophageal candidiasis and trichomonas, currently being treated with anti-microbials. SBP ruled out, and he has received broad spectrum antibiotics. Most recent echo 05/16/24 with normal RV/LV function, but with LV hypertrophy. I suspect his hypotension is secondary to vasoplegia due to liver cirrhosis, low oncotic pressure (malnutrition/liver disease), and renal failure. He has has a significant increase in his vasopressor requirements over the past 24 hours, no on nor-epinephrine  and vasopressin . Given increased white count will rule out an infectious process.  Pulm - small pleural effusion previously noted on chest xray. Oxygen requirements increased this AM, and we will check a chest xray to rule out pneumonia or increased pleural effusion. Will consider thoracentesis based on findings from xray and point of care ultrasound.  GI - EtOH liver cirrhosis, with decompensation in the form of notable ascites. No varices noted. Recent portal vein thrombosis that likely further contributed to decompensation. His LFT's continue to rise, with bilirubin up to 11 today. Overall poor prognosis given severity of liver disease/ESLD; palliative care consulted, input appreciated. Will repeat liver US  to rule out  recurrence of portal vein thrombosis.  MELD 3.0: 38 at 06/12/2024  5:05 AM MELD-Na: 37 at 06/12/2024  5:05 AM Calculated from: Serum Creatinine: 2.8 mg/dL at 89/84/7974  4:94 AM Serum Sodium: 132 mmol/L at 06/12/2024  5:05 AM Total Bilirubin: 11 mg/dL at 89/84/7974  4:94 AM Serum Albumin : 3.6 g/dL (Using max of 3.5 g/dL) at 89/84/7974  4:94 AM INR(ratio): 2.6 at 06/12/2024  5:05 AM Age at listing (hypothetical): 71 years Sex: Male at 06/12/2024   5:05 AM  Renal - ESRD currently on HD. Appreciate input from nephrology. With his vasopressor requirements would avoid HD and transition back to CRRT. Will consider an infectious process.  Endo - while he did have functional adrenal insufficiency during his prior admission, his AM cortisol level was normal this admit, and again normal on repeat yesterday. ICU glycemic protocol ordered.  Hem/Onc - portal vein thrombosis noted on previous admission, resolved on repeat imaging this admission. AC for PVT is in general a 6 month course, but given his frailty and GI bleed/anemia during this hospitalization this has been held. Would consider resuming if his H/H stabilize. Resumed DVT prophylaxis. Will recheck for PVT given worsening liver function.  ID - esophageal and oropharyngeal candidiasis, in addition to trichomonas (?tenax or pentatrichomas hominis per ID) in the esophageal brushing. On Fluconazole (14 to 21 days minimum) and Flagyl (10-14 days). Given increased vasopressor requirements and indwelling central venous catheters (L. Internal jugular CVC placed 05/29/2024); R. Internal jugular tunneled dialysis catheter placed 05/21/2024), will need to rule out a blood borne pathogen with repeat blood cultures. Restarting broad spectrum antibiotics with Vancomycin  and Zosyn . I will consider removing L. Internal jugular CVC and re-placing, though would prefer to avoid the groin and will await culture results.  Labs   CBC: Recent Labs  Lab 06/08/24 0417 06/09/24 0509 06/10/24 0422 06/11/24 0425 06/12/24 0505  WBC 29.0* 22.0* 21.7* 20.5* 26.6*  NEUTROABS  --   --   --  17.5* 23.6*  HGB 9.1* 8.3* 8.0* 8.0* 8.8*  HCT 28.9* 26.1* 25.0* 24.4* 27.3*  MCV 103.2* 100.0 101.2* 98.8 98.6  PLT 115* 81* 100* 109* 120*    Basic Metabolic Panel: Recent Labs  Lab 06/07/24 0416 06/08/24 0417 06/09/24 0509 06/10/24 0422 06/11/24 0425 06/12/24 0505  NA 133* 134* 134* 130* 133* 132*  K 4.4 5.0 3.9 4.3 4.9  4.1  CL 102 99 96* 94* 97* 96*  CO2 23 21* 26 22 22 22   GLUCOSE 122* 110* 117* 88 87 91  BUN 51* 76* 43* 66* 80* 47*  CREATININE 2.53* 3.88* 2.37* 3.74* 4.86* 2.80*  CALCIUM  8.4* 8.4* 8.4* 8.4* 8.7* 8.8*  MG 2.7* 3.0* 2.1 2.7* 2.9* 2.7*  PHOS 4.6 5.1* 3.5 5.6*  --  4.5   GFR: Estimated Creatinine Clearance: 30.2 mL/min (A) (by C-G formula based on SCr of 2.8 mg/dL (H)). Recent Labs  Lab 06/09/24 0509 06/10/24 0422 06/10/24 1015 06/10/24 1729 06/10/24 1947 06/11/24 0425 06/11/24 1443 06/12/24 0505  WBC 22.0* 21.7*  --   --   --  20.5*  --  26.6*  LATICACIDVEN  --   --    < > 3.7* 3.2* 2.4* 3.4*  --    < > = values in this interval not displayed.    Liver Function Tests: Recent Labs  Lab 06/08/24 0417 06/09/24 0509 06/10/24 0422 06/11/24 0425 06/12/24 0505  AST  --   --   --  213* 244*  ALT  --   --   --  133* 146*  ALKPHOS  --   --   --  136* 159*  BILITOT  --   --   --  8.5* 11.0*  PROT  --   --   --  5.1* 5.8*  ALBUMIN  2.3* 2.7* 3.1* 3.0* 3.6   No results for input(s): LIPASE, AMYLASE in the last 168 hours. No results for input(s): AMMONIA in the last 168 hours.  ABG    Component Value Date/Time   PHART 7.298 (L) 11/27/2021 0345   PCO2ART 39.7 11/27/2021 0345   PO2ART 116 (H) 11/27/2021 0345   HCO3 21.9 05/29/2024 2040   TCO2 21 (L) 08/08/2023 0916   ACIDBASEDEF 2.8 (H) 05/29/2024 2040   O2SAT 73.5 06/01/2024 1125     Coagulation Profile: Recent Labs  Lab 06/08/24 1053 06/11/24 0425 06/12/24 0505  INR 1.7* 2.2* 2.6*    Cardiac Enzymes: No results for input(s): CKTOTAL, CKMB, CKMBINDEX, TROPONINI in the last 168 hours.  HbA1C: Hgb A1c MFr Bld  Date/Time Value Ref Range Status  11/27/2021 03:28 AM 5.6 4.8 - 5.6 % Final    Comment:    (NOTE) Pre diabetes:          5.7%-6.4%  Diabetes:              >6.4%  Glycemic control for   <7.0% adults with diabetes     CBG: Recent Labs  Lab 06/11/24 1610 06/11/24 1916  06/11/24 2316 06/12/24 0328 06/12/24 0751  GLUCAP 75 82 90 88 88    Review of Systems:   N/A  Past Medical History:  He,  has a past medical history of Alcoholic cirrhosis of liver (HCC), Anemia (2024), Cataract (2024), CHF (congestive heart failure) (HCC), Chronic gouty arthritis, CKD (chronic kidney disease), stage III (HCC), Clotting disorder, Complication of anesthesia, COPD (chronic obstructive pulmonary disease) (HCC), Hyperlipidemia, Hypertension, Hyperuricemia, Peripheral arterial disease, Pre-diabetes, Prostate cancer (HCC) (2023), and Sleep apnea.   Surgical History:   Past Surgical History:  Procedure Laterality Date   ABDOMINAL AORTOGRAM W/LOWER EXTREMITY N/A 10/20/2021   Procedure: ABDOMINAL AORTOGRAM W/LOWER EXTREMITY;  Surgeon: Darron Deatrice LABOR, MD;  Location: [facility];  Service: Cardiovascular;  Laterality: N/A;   ABDOMINAL AORTOGRAM W/LOWER EXTREMITY N/A 08/08/2023   Procedure: ABDOMINAL AORTOGRAM W/LOWER EXTREMITY;  Surgeon: Serene Gaile ORN, MD;  Location: [facility];  Service: Cardiovascular;  Laterality: N/A;   ALLOGRAFT APPLICATION Bilateral 12/22/2021   Procedure: ALLOGRAFT APPLICATION;  Surgeon: Serene Gaile ORN, MD;  Location: [facility] OR;  Service: Vascular;  Laterality: Bilateral;   ANGIOPLASTY Left 11/26/2021   Procedure: LEFT INFRARENAL LITHOTRIPSY;  Surgeon: Serene Gaile ORN, MD;  Location: [facility] OR;  Service: Vascular;  Laterality: Left;   APPLICATION OF WOUND VAC Bilateral 12/13/2021   Procedure: APPLICATION OF WOUND VAC;  Surgeon: Lanis Fonda BRAVO, MD;  Location: [facility] OR;  Service: Vascular;  Laterality: Bilateral;   CARDIAC CATHETERIZATION     [facility]   CATARACT EXTRACTION W/PHACO Left 04/04/2023   Procedure: CATARACT EXTRACTION PHACO AND INTRAOCULAR LENS PLACEMENT (IOC) LEFT 8.71 00:45.5;  Surgeon: Jaye Fallow, MD;  Location: [facility];  Service: Ophthalmology;  Laterality: Left;   CATARACT EXTRACTION W/PHACO Right 04/18/2023    Procedure: CATARACT EXTRACTION PHACO AND INTRAOCULAR LENS PLACEMENT (IOC) RIGHT 4.31 00:8.2;  Surgeon: Jaye Fallow, MD;  Location: [facility];  Service: Ophthalmology;  Laterality: Right;   COLONOSCOPY     COLONOSCOPY WITH PROPOFOL  N/A 01/17/2018   Procedure: COLONOSCOPY WITH PROPOFOL ;  Surgeon: Holland, Teodoro  K, MD;  Location: [facility]COPY;  Service: Gastroenterology;  Laterality: N/A;   COLONOSCOPY WITH PROPOFOL  N/A 09/12/2022   Procedure: COLONOSCOPY WITH PROPOFOL ;  Surgeon: Maryruth Ole DASEN, MD;  Location: [facility];  Service: Endoscopy;  Laterality: N/A;   COLONOSCOPY WITH PROPOFOL  N/A 09/05/2023   Procedure: COLONOSCOPY WITH PROPOFOL ;  Surgeon: Maryruth Ole DASEN, MD;  Location: [facility];  Service: Endoscopy;  Laterality: N/A;   DIALYSIS/PERMA CATHETER INSERTION Right 05/21/2024   Procedure: DIALYSIS/PERMA CATHETER INSERTION;  Surgeon: Jama Cordella MATSU, MD;  Location: [facility];  Service: Cardiovascular;  Laterality: Right;   ENDARTERECTOMY FEMORAL Bilateral 11/26/2021   Procedure: BILATERAL FEMORAL ENDARTERECTOMY WITH VEIN PATCH ANGIOPLASTY;  Surgeon: Serene Gaile ORN, MD;  Location: [facility];  Service: Vascular;  Laterality: Bilateral;   ENTEROSCOPY N/A 09/05/2023   Procedure: ENTEROSCOPY;  Surgeon: Maryruth Ole DASEN, MD;  Location: [facility];  Service: Endoscopy;  Laterality: N/A;   ESOPHAGOGASTRODUODENOSCOPY N/A 06/05/2024   Procedure: EGD (ESOPHAGOGASTRODUODENOSCOPY);  Surgeon: Therisa Bi, MD;  Location: [facility];  Service: Gastroenterology;  Laterality: N/A;  IC-10   ESOPHAGOGASTRODUODENOSCOPY (EGD) WITH PROPOFOL  N/A 01/17/2018   Procedure: ESOPHAGOGASTRODUODENOSCOPY (EGD) WITH PROPOFOL ;  Surgeon: Toledo, Ladell POUR, MD;  Location: [facility];  Service: Gastroenterology;  Laterality: N/A;   ESOPHAGOGASTRODUODENOSCOPY (EGD) WITH PROPOFOL  N/A 09/12/2022   Procedure: ESOPHAGOGASTRODUODENOSCOPY (EGD) WITH PROPOFOL ;  Surgeon: Maryruth Ole DASEN, MD;  Location: [facility];  Service: Endoscopy;  Laterality: N/A;   EYE SURGERY     GROIN DEBRIDEMENT Bilateral 12/16/2021   Procedure: IRRIGATION AND DEBRIDEMENT OF BILATERAL GROINS;  Surgeon: Serene Gaile ORN, MD;  Location: [facility];  Service: Vascular;  Laterality: Bilateral;   INCISION AND DRAINAGE OF WOUND Bilateral 12/13/2021   Procedure: IRRIGATION AND DEBRIDEMENT OF BILATERAL GROIN WOUNDS;  Surgeon: Lanis Fonda BRAVO, MD;  Location: [facility] OR;  Service: Vascular;  Laterality: Bilateral;   INSERTION OF ILIAC STENT Bilateral 11/26/2021   Procedure: BILATERAL ILIAC STENTING USING 17mmX80mm INNOVA STENT ON LEFT ILIAC AND 76mmX59mm, 50mmX79mm VBX AND 50mmX7.5cm VIABAHN STENT ON RIGHT ILIAC;  Surgeon: Serene Gaile ORN, MD;  Location: [facility];  Service: Vascular;  Laterality: Bilateral;   INSERTION OF SEEDS IN PROSTATE     ORIF ANKLE FRACTURE Right    PERIPHERAL INTRAVASCULAR LITHOTRIPSY  08/08/2023   Procedure: PERIPHERAL INTRAVASCULAR LITHOTRIPSY;  Surgeon: Serene Gaile ORN, MD;  Location: [facility];  Service: Cardiovascular;;   PROSTATE BIOPSY N/A 04/01/2021   Procedure: PROSTATE BIOPSY GRAYCE;  Surgeon: Kassie Ozell SAUNDERS, MD;  Location: [facility];  Service: Urology;  Laterality: N/A;   VEIN HARVEST Bilateral 11/26/2021   Procedure: VEIN HARVEST OF BILATERAL SPAHENOUS VEINS;  Surgeon: Serene Gaile ORN, MD;  Location[facility];  Service: Vascular;  Laterality: Bilateral;   WOUND DEBRIDEMENT Bilateral 12/22/2021   Procedure: INCISION AND DEBRIDEMENT OF BILATERAL GROINS;  Surgeon: Serene Gaile ORN, MD;  Location: [facility];  Service: Vascular;  Laterality: Bilateral;     Social History:   reports that he quit smoking about 9 years ago. His smoking use included cigarettes. He started smoking about 56 years ago. He has a 47 pack-year smoking history. He has never been exposed to tobacco smoke. He has never used smokeless tobacco. He reports that he does not currently use alcohol. He reports  that he does not use drugs.   Family History:  His family history includes Heart disease in his brother and mother.   Allergies Allergies  Allergen Reactions   Jardiance [Empagliflozin]  Loss of balance, hypoglycemic      Home Medications  Prior to Admission medications   Medication Sig Start Date End Date Taking? Authorizing Provider  allopurinol  (ZYLOPRIM ) 100 MG tablet Take 150 mg by mouth daily.   Yes [provider]  amiodarone  (PACERONE ) 200 MG tablet Take 200 mg by mouth 2 (two) times daily.   Yes [provider]  APIXABAN  (ELIQUIS ) VTE STARTER PACK (10MG  AND 5MG ) Take as directed on package: start with two-5mg  tablets twice daily for 7 days. On day 8, switch to one-5mg  tablet twice daily. 05/24/24  Yes Jens Durand, MD  aspirin  EC 81 MG tablet Take 81 mg by mouth every morning.   Yes [provider]  benzonatate (TESSALON) 100 MG capsule Take 200 mg by mouth 3 (three) times daily as needed. 05/17/24  Yes [provider]  Cholecalciferol  (VITAMIN D3) 25 MCG (1000 UT) CAPS Take 2,000 Units by mouth daily.   Yes [provider]  ezetimibe  (ZETIA ) 10 MG tablet Take 1 tablet (10 mg total) by mouth daily. 04/10/18  Yes Gollan, Timothy J, MD  iron  polysaccharides (NIFEREX) 150 MG capsule Take 1 capsule (150 mg total) by mouth 2 (two) times daily. 03/11/24 06/09/24 Yes Sreenath, Sudheer B, MD  midodrine  (PROAMATINE ) 10 MG tablet Take 1 tablet (10 mg total) by mouth 3 (three) times daily with meals. 05/24/24  Yes Jens Durand, MD  pantoprazole  (PROTONIX ) 40 MG tablet Take 40 mg by mouth daily.   Yes [provider]  simvastatin  (ZOCOR ) 20 MG tablet Take 20 mg by mouth in the morning.   Yes [provider]  triamcinolone  ointment (KENALOG ) 0.1 % Apply 1 Application topically 2 (two) times daily as needed (psoriasis). 07/18/23  Yes [provider]  zolpidem  (AMBIEN ) 10 MG tablet Take 10 mg by mouth at bedtime.   Yes  [provider]     The patient is critically ill due to end stage liver disease, liver cirrhosis, end stage renal failure, sepsis.  Critical care was necessary to treat or prevent imminent or life-threatening deterioration. Critical care time was spent by me on the following activities: development of a treatment plan with the patient and/or surrogate as well as nursing, discussions with consultants, evaluation of the patient's response to treatment, examination of the patient, obtaining a history from the patient or surrogate, ordering and performing treatments and interventions, ordering and review of laboratory studies, ordering and review of radiographic studies, review of telemetry data including pulse oximetry, re-evaluation of patient's condition and participation in multidisciplinary rounds.   I personally spent 56 minutes providing critical care not including any separately billable procedures.   Belva November, MD [facility] 06/12/2024 8:42 AM

## 2024-06-12 NOTE — Progress Notes (Addendum)
 Occupational Therapy Treatment Patient Details Name: Kevin Gross MRN: [id number] DOB: 12/09/1955 Today's Date: 06/12/2024   History of present illness 68 y.o. male who presented to [facility] ED via EMS from his home to be evaluated for hypotension and weakness. He reports his initial outpatient hemodialysis appointment was 05/28/2024 at [facility]. This initial appointment is following a recent admission at [facility], of which he was discharged on 05/24/24.     Weakness began following his dialysis appointment, pt was recently here with septic shock.  PMH: alcoholic cirrhosis of liver, CHF, COPD, HLD, HTN, stage III CKD, gout, anemia, PVD, OSA on CPAP.   OT comments  Kevin Gross was seen for OT/PT co-treatment on this date. Per RN pt with increased need for pressors, cleared for bed level therapy. Upon arrival to room pt reclined in bed, HOB elevated to 50* with increased dizziness, to 40* with MAP >60. Completed bed level exercises as described below. Pt endorses dizziness and fatigue, reclined and BP 86/48, RN notified and in to assess. Continue to benefit from skilled OT as pt appropriate, discharge recommendation updated to reflect limited progress.   BP in session 100/46 (63) 99/48 (61) c/o dizzy 101/44(62) 86/48 (59)        If plan is discharge home, recommend the following:  Two people to help with walking and/or transfers;A lot of help with bathing/dressing/bathroom;Assistance with cooking/housework;Assist for transportation;Help with stairs or ramp for entrance   Equipment Recommendations  Other (comment) (defer)    Recommendations for Other Services      Precautions / Restrictions Precautions Precautions: Fall Recall of Precautions/Restrictions: Intact Precaution/Restrictions Comments: watch BP, O2 Restrictions Weight Bearing Restrictions Per Provider Order: No       Mobility Bed Mobility               General bed mobility comments: not attempted 2/2 medical  status    Transfers                   General transfer comment: not attempted 2/2 medical status         ADL either performed or assessed with clinical judgement   ADL Overall ADL's : Needs assistance/impaired                                       General ADL Comments: MIN A bed level grooming, fatigues quickly     Communication Communication Factors Affecting Communication: Reduced clarity of speech (low volume)   Cognition Arousal: Alert Behavior During Therapy: WFL for tasks assessed/performed Cognition: No apparent impairments                               Following commands: Intact        Cueing   Cueing Techniques: Verbal cues  Exercises Exercises: General Upper Extremity General Exercises - Upper Extremity Shoulder Flexion: AROM, Both, 10 reps, Supine Shoulder Horizontal ABduction: AROM, Both, 10 reps, Supine Shoulder Horizontal ADduction: AROM, Both, 10 reps, Supine Elbow Flexion: AROM, Both, 10 reps, Supine Elbow Extension: AROM, Both, 10 reps, Supine    Shoulder Instructions       General Comments      Pertinent Vitals/ Pain       Pain Assessment Pain Assessment: Faces Faces Pain Scale: Hurts little more Pain Location: stomach Pain Descriptors / Indicators: Discomfort Pain Intervention(s): Limited activity  within patient's tolerance, Repositioned   Frequency  Min 2X/week        Progress Toward Goals  OT Goals(current goals can now be found in the care plan section)  Progress towards OT goals: Progressing toward goals  Acute Rehab OT Goals OT Goal Formulation: With patient Time For Goal Achievement: 06/20/24 Potential to Achieve Goals: Fair ADL Goals Pt Will Perform Grooming: sitting;with supervision Pt Will Perform Lower Body Dressing: sit to/from stand;with min assist Pt Will Transfer to Toilet: ambulating;with min assist Pt Will Perform Toileting - Clothing Manipulation and hygiene: with min  assist;sit to/from stand  Plan      Co-evaluation    PT/OT/SLP Co-Evaluation/Treatment: Yes Reason for Co-Treatment: For patient/therapist safety PT goals addressed during session: Strengthening/ROM OT goals addressed during session: ADL's and self-care      AM-PAC OT 6 Clicks Daily Activity     Outcome Measure   Help from another person eating meals?: None Help from another person taking care of personal grooming?: A Little Help from another person toileting, which includes using toliet, bedpan, or urinal?: A Lot Help from another person bathing (including washing, rinsing, drying)?: A Lot Help from another person to put on and taking off regular upper body clothing?: A Little Help from another person to put on and taking off regular lower body clothing?: A Lot 6 Click Score: 16    End of Session    OT Visit Diagnosis: Other abnormalities of gait and mobility (R26.89);Muscle weakness (generalized) (M62.81)   Activity Tolerance Treatment limited secondary to medical complications (Comment)   Patient Left in bed;with call bell/phone within reach   Nurse Communication Other (comment) (O2 and BP dropping)        Time: 8641-8573 OT Time Calculation (min): 28 min  Charges: OT General Charges $OT Visit: 1 Visit OT Treatments $Therapeutic Exercise: 8-22 mins  Elston Slot, M.S. OTR/L  06/12/24, 3:29 PM  ascom [phone]

## 2024-06-12 NOTE — Progress Notes (Signed)
 Brief Progress Note:   Presented to the patient's bedside with report of increased hypoxia as well as hypotension, with rising pressure requirements. Patient's vasopressors have been uptitrated to 20 mcg of nor-epinephrine  as well as 0.04 units of vasopressin . He is on a facemask and maintaining oxygenation of 90%. He feels tired.  Discussed that with worsening respiratory failure we might reach a point of having a breathing tube placed. Patient initially agreeable to being intubated if it helps get rid of the phlegm. When I explained that after intubation and mechanical ventilation he might not get better to the point of extubation, he indicated that it might be better for him to proceed with palliative care instead.  His antibiotics have been broadened to zosyn  and vancomycin , and repeat blood cultures sent. We will try to obtain respiratory cultures. Will also get a repeat set of blood cultures. Will also repeat his CXR.  Patient's condition overall is critical, and I fear with his end stage liver disease and end stage renal failure he might not survive this hospitalization as he is not worked up for transplant and given overall condition is unlikely to be transplanted one any time soon. Will continue to engage with patient and with palliative care services.   Belva November, MD [facility] 06/12/2024 2:50 PM

## 2024-06-12 NOTE — Consult Note (Cosign Needed Addendum)
 PHARMACY CONSULT NOTE - FOLLOW UP  Pharmacy Consult for Electrolyte Monitoring and Replacement   Recent Labs: Potassium (mmol/L)  Date Value  06/12/2024 4.1   Magnesium  (mg/dL)  Date Value  89/84/7974 2.7 (H)   Calcium  (mg/dL)  Date Value  89/84/7974 8.8 (L)   Albumin  (g/dL)  Date Value  89/84/7974 3.6   Phosphorus (mg/dL)  Date Value  89/84/7974 4.5   Sodium (mmol/L)  Date Value  06/12/2024 132 (L)  10/12/2021 145 (H)     Assessment: Kevin Gross is a 68 yom that present with generalized weakness and hypotension. Patient has a past medical history of HTN, HLD, CHF, ESRD on dialysis, and AUD.   Goal of Therapy:  Electrolytes WNL  Plan:  K and phos wnl, Mag is 2.7 but expect Mag to go down with scheduled dialysis tomorrow Continue to monitor electrolytes with Kevin Gross labs Ordered Mag level for 10/17 with Kevin Gross labs (after Jun 26, 2024 dialysis)  Kevin Gross ,PharmD Clinical Pharmacist 06/12/2024 11:46 Kevin Gross

## 2024-06-12 NOTE — Consult Note (Signed)
 Pharmacy Antibiotic Note  Kevin Gross is a 68 y.o. male admitted on 05/29/2024 with sepsis secondary to .  Pharmacy has been consulted for vancomycin  and Zosyn  dosing.  WBC 32 on admission and remained in 20s with Zosyn  majority of admission. Since 10/10 has been on Flagyl and fluconazole alone, and WBC downtrended to 20 on 10/14. However, WBC now 26.6 with HR 100s. MAP overnight 58-73. On Bricelyn 6L (BL RA). 10/15 CXR showed: mild bibasilar atelectasis and small left pleural effusion.  Patient is on dialysis, and underwent yesterday. Next session scheduled for tomorrow 06-14-2024. Patient received vancomycin  2g IV loading dose today.  Confirmed with ID that Zosyn  and Flagyl are not redundant since Flagyl is being used to cover trichomonas.  Plan: Changed Zosyn  to HD dosing 2.25 g IV q8h Continue Flagyl, per ID Plan to dose vancomycin  1g IV post-planned HD tomorrow Continue to monitor renal function with AM labs   Height: 5' 9 (175.3 cm) Weight: 99.7 kg (219 lb 12.8 oz) (bed scale broken, previous weight documented) IBW/kg (Calculated) : 70.7  Temp (24hrs), Avg:98 F (36.7 C), Min:97.5 F (36.4 C), Max:98.8 F (37.1 C)  Recent Labs  Lab 06/08/24 0417 06/09/24 0509 06/10/24 0422 06/10/24 1015 06/10/24 1315 06/10/24 1729 06/10/24 1947 06/11/24 0425 06/11/24 1443 06/12/24 0505  WBC 29.0* 22.0* 21.7*  --   --   --   --  20.5*  --  26.6*  CREATININE 3.88* 2.37* 3.74*  --   --   --   --  4.86*  --  2.80*  LATICACIDVEN  --   --   --    < > 3.6* 3.7* 3.2* 2.4* 3.4*  --    < > = values in this interval not displayed.    Estimated Creatinine Clearance: 30.2 mL/min (A) (by C-G formula based on SCr of 2.8 mg/dL (H)).    Allergies  Allergen Reactions   Jardiance [Empagliflozin]     Loss of balance, hypoglycemic     Antimicrobials this admission: vancomycin  10/15 >> Zosyn  10/1-10/9; 10/13>> Flagyl 10/14 >> Fluconazole 10/8 >>  Dose adjustments this admission: Zosyn  3.375 >  2.25g  Microbiology results: 10/1 MRSA PCR: neg 10/1 resp panel: neg 10/8 GI PCR: neg 10/11 peritoneal fluid cx: NGTD  Thank you for allowing pharmacy to be a part of this patient's care.  Kevin Gross Kevin Gross 06/12/2024 9:02 AM

## 2024-06-12 NOTE — Progress Notes (Signed)
 [facility]  ROUNDING NOTE   Subjective:   Kevin Gross  is a 68 y.o.  male  with past medical conditions including hypertension, COPD, obstructive sleep apnea with CPAP, anemia, and recent acute kidney injury requiring hemodialysis. Patient presents to ED with decreased blood pressure and has been admitted to ICU for Septic shock (HCC) [A41.9, R65.21]  Patient seen and evaluated at bedside. Alert and oriented.   Update: Patient seen at bedside.  Had HD yesterday. Now back on vasopressin  and norepinephrine .    Objective:  Vital signs in last 24 hours:  Temp:  [97.5 F (36.4 C)-98.8 F (37.1 C)] 98.8 F (37.1 C) (10/15 0600) Pulse Rate:  [69-121] 103 (10/15 0730) Resp:  [13-28] 20 (10/15 0730) BP: (78-118)/(28-59) 98/52 (10/15 0730) SpO2:  [88 %-100 %] 93 % (10/15 0730)  Weight change:  Filed Weights   06/09/24 0500 06/10/24 0500 06/11/24 0500  Weight: 99.4 kg 99.7 kg 99.7 kg    Intake/Output: I/O last 3 completed shifts: In: 561 [I.V.:321.1; IV Piggyback:239.9] Out: 1200 [Other:1200]   Intake/Output this shift:  No intake/output data recorded.  Physical Exam: General: NAD  Head: Normocephalic, atraumatic. Moist oral mucosal membranes  Eyes: Anicteric  Lungs:  Clear to auscultation, normal effort  Heart: Regular rate and rhythm  Abdomen:  Soft, nontender  Extremities: 1+ peripheral edema  Neurologic: Awake, alert  Skin: Generalized ecchymosis  Access: Right IJ PermCath    Basic Metabolic Panel: Recent Labs  Lab 06/07/24 0416 06/08/24 0417 06/09/24 0509 06/10/24 0422 06/11/24 0425 06/12/24 0505  NA 133* 134* 134* 130* 133* 132*  K 4.4 5.0 3.9 4.3 4.9 4.1  CL 102 99 96* 94* 97* 96*  CO2 23 21* 26 22 22 22   GLUCOSE 122* 110* 117* 88 87 91  BUN 51* 76* 43* 66* 80* 47*  CREATININE 2.53* 3.88* 2.37* 3.74* 4.86* 2.80*  CALCIUM  8.4* 8.4* 8.4* 8.4* 8.7* 8.8*  MG 2.7* 3.0* 2.1 2.7* 2.9* 2.7*  PHOS 4.6 5.1* 3.5 5.6*  --  4.5    Liver  Function Tests: Recent Labs  Lab 06/08/24 0417 06/09/24 0509 06/10/24 0422 06/11/24 0425 06/12/24 0505  AST  --   --   --  213* 244*  ALT  --   --   --  133* 146*  ALKPHOS  --   --   --  136* 159*  BILITOT  --   --   --  8.5* 11.0*  PROT  --   --   --  5.1* 5.8*  ALBUMIN  2.3* 2.7* 3.1* 3.0* 3.6   No results for input(s): LIPASE, AMYLASE in the last 168 hours. No results for input(s): AMMONIA in the last 168 hours.   CBC: Recent Labs  Lab 06/08/24 0417 06/09/24 0509 06/10/24 0422 06/11/24 0425 06/12/24 0505  WBC 29.0* 22.0* 21.7* 20.5* 26.6*  NEUTROABS  --   --   --  17.5* 23.6*  HGB 9.1* 8.3* 8.0* 8.0* 8.8*  HCT 28.9* 26.1* 25.0* 24.4* 27.3*  MCV 103.2* 100.0 101.2* 98.8 98.6  PLT 115* 81* 100* 109* 120*    Cardiac Enzymes: No results for input(s): CKTOTAL, CKMB, CKMBINDEX, TROPONINI in the last 168 hours.  BNP: Invalid input(s): POCBNP  CBG: Recent Labs  Lab 06/11/24 1128 06/11/24 1610 06/11/24 1916 06/11/24 2316 06/12/24 0328  GLUCAP 75 75 82 90 88      Microbiology: Results for orders placed or performed during the hospital encounter of 05/29/24  Resp panel by RT-PCR (RSV,  Flu A&B, Covid) Anterior Nasal Swab     Status: None   Collection Time: 05/29/24 12:56 PM   Specimen: Anterior Nasal Swab  Result Value Ref Range Status   SARS Coronavirus 2 by RT PCR NEGATIVE NEGATIVE Final    Comment: (NOTE) SARS-CoV-2 target nucleic acids are NOT DETECTED.  The SARS-CoV-2 RNA is generally detectable in upper respiratory specimens during the acute phase of infection. The lowest concentration of SARS-CoV-2 viral copies this assay can detect is 138 copies/mL. A negative result does not preclude SARS-Cov-2 infection and should not be used as the sole basis for treatment or other patient management decisions. A negative result may occur with  improper specimen collection/handling, submission of specimen other than nasopharyngeal swab, presence  of viral mutation(s) within the areas targeted by this assay, and inadequate number of viral copies(<138 copies/mL). A negative result must be combined with clinical observations, patient history, and epidemiological information. The expected result is Negative.  Fact Sheet for Patients:  BloggerCourse.com  Fact Sheet for Healthcare Providers:  SeriousBroker.it  This test is no t yet approved or cleared by the [place]  FDA and  has been authorized for detection and/or diagnosis of SARS-CoV-2 by FDA under an Emergency Use Authorization (EUA). This EUA will remain  in effect (meaning this test can be used) for the duration of the COVID-19 declaration under Section 564(b)(1) of the Act, 21 U.S.C.section 360bbb-3(b)(1), unless the authorization is terminated  or revoked sooner.       Influenza A by PCR NEGATIVE NEGATIVE Final   Influenza B by PCR NEGATIVE NEGATIVE Final    Comment: (NOTE) The Xpert Xpress SARS-CoV-2/FLU/RSV plus assay is intended as an aid in the diagnosis of influenza from Nasopharyngeal swab specimens and should not be used as a sole basis for treatment. Nasal washings and aspirates are unacceptable for Xpert Xpress SARS-CoV-2/FLU/RSV testing.  Fact Sheet for Patients: BloggerCourse.com  Fact Sheet for Healthcare Providers: SeriousBroker.it  This test is not yet approved or cleared by the [place]  FDA and has been authorized for detection and/or diagnosis of SARS-CoV-2 by FDA under an Emergency Use Authorization (EUA). This EUA will remain in effect (meaning this test can be used) for the duration of the COVID-19 declaration under Section 564(b)(1) of the Act, 21 U.S.C. section 360bbb-3(b)(1), unless the authorization is terminated or revoked.     Resp Syncytial Virus by PCR NEGATIVE NEGATIVE Final    Comment: (NOTE) Fact Sheet for  Patients: BloggerCourse.com  Fact Sheet for Healthcare Providers: SeriousBroker.it  This test is not yet approved or cleared by the [place]  FDA and has been authorized for detection and/or diagnosis of SARS-CoV-2 by FDA under an Emergency Use Authorization (EUA). This EUA will remain in effect (meaning this test can be used) for the duration of the COVID-19 declaration under Section 564(b)(1) of the Act, 21 U.S.C. section 360bbb-3(b)(1), unless the authorization is terminated or revoked.  Performed at [facility], [address] [place], [place] [address]   Blood culture (routine x 2)     Status: None   Collection Time: 05/29/24 12:56 PM   Specimen: BLOOD  Result Value Ref Range Status   Specimen Description BLOOD RIGHT ANTECUBITAL  Final   Special Requests   Final    BOTTLES DRAWN AEROBIC AND ANAEROBIC Blood Culture results may not be optimal due to an inadequate volume of blood received in culture bottles   Culture   Final    NO GROWTH 5 DAYS Performed at [facility]  [facility], [address] [place], [place] [address]    Report Status 06/03/2024 FINAL  Final  Blood culture (routine x 2)     Status: None   Collection Time: 05/29/24 12:56 PM   Specimen: BLOOD  Result Value Ref Range Status   Specimen Description BLOOD LEFT ANTECUBITAL  Final   Special Requests   Final    BOTTLES DRAWN AEROBIC AND ANAEROBIC Blood Culture results may not be optimal due to an inadequate volume of blood received in culture bottles   Culture   Final    NO GROWTH 5 DAYS Performed at [facility], [address], [place], [place] [address]    Report Status 06/03/2024 FINAL  Final  MRSA Next Gen by PCR, Nasal     Status: None   Collection Time: 05/29/24  2:58 PM   Specimen: Nasal Mucosa; Nasal Swab  Result Value Ref Range Status   MRSA by PCR Next Gen NOT DETECTED NOT DETECTED Final    Comment: (NOTE) The GeneXpert  MRSA Assay (FDA approved for NASAL specimens only), is one component of a comprehensive MRSA colonization surveillance program. It is not intended to diagnose MRSA infection nor to guide or monitor treatment for MRSA infections. Test performance is not FDA approved in patients less than 69 years old. Performed at [facility], [address] [place], [place] [address]   Group A Strep by PCR     Status: None   Collection Time: 05/29/24  2:58 PM   Specimen: Ascities; Sterile Swab  Result Value Ref Range Status   Group A Strep by PCR NOT DETECTED NOT DETECTED Final    Comment: Performed at [facility], [address] [place], [place] [address]  Respiratory (~20 pathogens) panel by PCR     Status: None   Collection Time: 05/29/24  3:00 PM  Result Value Ref Range Status   Adenovirus NOT DETECTED NOT DETECTED Final   Coronavirus 229E NOT DETECTED NOT DETECTED Final    Comment: (NOTE) The Coronavirus on the Respiratory Panel, DOES NOT test for the novel  Coronavirus (2019 nCoV)    Coronavirus HKU1 NOT DETECTED NOT DETECTED Final   Coronavirus NL63 NOT DETECTED NOT DETECTED Final   Coronavirus OC43 NOT DETECTED NOT DETECTED Final   Metapneumovirus NOT DETECTED NOT DETECTED Final   Rhinovirus / Enterovirus NOT DETECTED NOT DETECTED Final   Influenza A NOT DETECTED NOT DETECTED Final   Influenza B NOT DETECTED NOT DETECTED Final   Parainfluenza Virus 1 NOT DETECTED NOT DETECTED Final   Parainfluenza Virus 2 NOT DETECTED NOT DETECTED Final   Parainfluenza Virus 3 NOT DETECTED NOT DETECTED Final   Parainfluenza Virus 4 NOT DETECTED NOT DETECTED Final   Respiratory Syncytial Virus NOT DETECTED NOT DETECTED Final   Bordetella pertussis NOT DETECTED NOT DETECTED Final   Bordetella Parapertussis NOT DETECTED NOT DETECTED Final   Chlamydophila pneumoniae NOT DETECTED NOT DETECTED Final   Mycoplasma pneumoniae NOT DETECTED NOT DETECTED Final    Comment: Performed at [facility], [address], [place], [place] [address]  MRSA Next Gen by PCR, Nasal     Status: None   Collection Time: 05/29/24  3:50 PM   Specimen: Nasal Mucosa; Nasal Swab  Result Value Ref Range Status   MRSA by PCR Next Gen NOT DETECTED NOT DETECTED Final    Comment: (NOTE) The GeneXpert MRSA Assay (FDA approved for NASAL specimens only), is one component of a comprehensive MRSA colonization  surveillance program. It is not intended to diagnose MRSA infection nor to guide or monitor treatment for MRSA infections. Test performance is not FDA approved in patients less than 41 years old. Performed at [facility], [address] [place], [place] [address]   KOH prep     Status: None   Collection Time: 06/05/24 10:00 AM   Specimen: Esophagus  Result Value Ref Range Status   Specimen Description ESOPHAGUS  Final   Special Requests Immunocompromised  Final   KOH Prep   Final    YEAST WITH PSEUDOHYPHAE RESULT CALLED TO, READ BACK BY AND VERIFIED WITH: NOTIFIED RACHEL VERDI, RN 1206 06/05/24 THAT TRICHOMONAS WAS ALSO SEEN ON SLIDE  GM Performed at [facility], [address] [place], [place] [address]    Report Status 06/05/2024 FINAL  Final  Gastrointestinal Panel by PCR , Stool     Status: None   Collection Time: 06/05/24  3:04 PM   Specimen: Stool  Result Value Ref Range Status   Campylobacter species NOT DETECTED NOT DETECTED Final   Plesimonas shigelloides NOT DETECTED NOT DETECTED Final   Salmonella species NOT DETECTED NOT DETECTED Final   Yersinia enterocolitica NOT DETECTED NOT DETECTED Final   Vibrio species NOT DETECTED NOT DETECTED Final   Vibrio cholerae NOT DETECTED NOT DETECTED Final   Enteroaggregative E coli (EAEC) NOT DETECTED NOT DETECTED Final   Enteropathogenic E coli (EPEC) NOT DETECTED NOT DETECTED Final   Enterotoxigenic E coli (ETEC) NOT DETECTED NOT DETECTED Final   Shiga like toxin producing E coli (STEC) NOT DETECTED NOT DETECTED  Final   Shigella/Enteroinvasive E coli (EIEC) NOT DETECTED NOT DETECTED Final   Cryptosporidium NOT DETECTED NOT DETECTED Final   Cyclospora cayetanensis NOT DETECTED NOT DETECTED Final   Entamoeba histolytica NOT DETECTED NOT DETECTED Final   Giardia lamblia NOT DETECTED NOT DETECTED Final   Adenovirus F40/41 NOT DETECTED NOT DETECTED Final   Astrovirus NOT DETECTED NOT DETECTED Final   Norovirus GI/GII NOT DETECTED NOT DETECTED Final   Rotavirus A NOT DETECTED NOT DETECTED Final   Sapovirus (I, II, IV, and V) NOT DETECTED NOT DETECTED Final    Comment: Performed at [facility], [address] [place], [place] [address]  Body fluid culture w Gram Stain     Status: None   Collection Time: 06/08/24  1:57 PM   Specimen: Peritoneal Washings; Body Fluid  Result Value Ref Range Status   Specimen Description   Final    PERITONEAL Performed at [facility], [address] [place], [place] [address]    Special Requests   Final    NONE Performed at [facility], [address] [place], [place] [address]    Gram Stain NO WBC SEEN NO ORGANISMS SEEN   Final   Culture   Final    NO GROWTH 3 DAYS Performed at [facility], [address], [place], [place] [address]    Report Status 06/11/2024 FINAL  Final    Coagulation Studies: Recent Labs    06/11/24 0425 06/12/24 0505  LABPROT 26.0* 28.8*  INR 2.2* 2.6*      Urinalysis: No results for input(s): COLORURINE, LABSPEC, PHURINE, GLUCOSEU, HGBUR, BILIRUBINUR, KETONESUR, PROTEINUR, UROBILINOGEN, NITRITE, LEUKOCYTESUR in the last 72 hours.  Invalid input(s): APPERANCEUR     Imaging: No results found.       Medications:    albumin  human Stopped (06/12/24 0539)   norepinephrine  (LEVOPHED ) Adult infusion 18 mcg/min (06/12/24  0600)   vasopressin  0.03 Units/min (06/12/24 0600)     vitamin C   500 mg Oral BID   Chlorhexidine  Gluconate Cloth  6 each Topical Daily    epoetin  alfa-epbx (RETACRIT ) injection  10,000 Units Subcutaneous Q T,Th,Sat-1800   fluconazole  200 mg Oral QHS   heparin  injection (subcutaneous)  5,000 Units Subcutaneous Q12H   magic mouthwash w/lidocaine   10 mL Oral TID   metroNIDAZOLE  500 mg Oral Q8H   midodrine   20 mg Oral TID WC   multivitamin  1 tablet Oral QHS   pantoprazole  (PROTONIX ) IV  40 mg Intravenous Q12H   Ensure Max Protein  11 oz Oral BID   alteplase , alteplase , docusate sodium , heparin , heparin , mouth rinse, polyethylene glycol  Assessment/ Plan:  [name] is a 68 y.o.  male  with past medical conditions including hypertension, COPD, obstructive sleep apnea with CPAP, anemia, and chronic kidney disease stage IV, who was admitted to [facility] on 05/29/2024 for Septic shock (HCC) [A41.9, R65.21]  [facility] Exira/TTS/RT permcath  Acute kidney injury on chronic kidney disease stage IV with Volume overload Baseline creatinine 2.77 with GFR 24 06/30/2024. CT abd/pelvis shows dense lesion on left kidney.  CRRT stopped 06/06/2024. Patient seen and evaluated at bedside this a.m.  Underwent dialysis treatment yesterday.  UF ultimately achieved was 1.2 kg.  Next dialysis treatment scheduled for tomorrow.     Lab Results  Component Value Date   CREATININE 2.80 (H) 06/12/2024   CREATININE 4.86 (H) 06/11/2024   CREATININE 3.74 (H) 06/10/2024    Intake/Output Summary (Last 24 hours) at 06/12/2024 0748 Last data filed at 06/12/2024 0600 Gross per 24 hour  Intake 387.45 ml  Output 1200 ml  Net -812.55 ml   2. Hypotension likely due to shock secondary to liver cirrhosis.  Patient back on vasopressin  and norepinephrine .  3. Anemia of chronic kidney disease Lab Results  Component Value Date   HGB 8.8 (L) 06/12/2024  Patient has received multiple blood products during this admission.  Continue Retacrit  10,000 units IV with dialysis treatments.  Hemoglobin currently 8.8.  4.  Secondary Hyperparathyroidism: with  outpatient labs: PTH 52.7, phosphorus 4.1, calcium  8.6 on 7/2.  Lab Results  Component Value Date   CALCIUM  8.8 (L) 06/12/2024   CAION 1.21 08/08/2023   PHOS 4.5 06/12/2024  Phosphorus down to 4.5 at this time.   LOS: 14 Kevin Gross 10/15/20257:48 AM

## 2024-06-12 NOTE — Consult Note (Signed)
 The patient is receiving Protonix  by the intravenous route.  Based on criteria approved by the Pharmacy and Therapeutics Committee and the Medical Executive Committee, the medication is being converted to the equivalent oral dose form.  These criteria include: -No active GI bleeding -Able to tolerate diet of full liquids (or better) or tube feeding -Able to tolerate other medications by the oral or enteral route  If you have any questions about this conversion, please contact the Pharmacy Department (phone [phone]).  Thank you.   Leonor Argyle, PharmD PGY1  06/12/2024 12:31 PM

## 2024-06-12 NOTE — Progress Notes (Addendum)
 Daily Progress Note   Patient Name: Kevin Gross       Date: 06/12/2024 DOB: Apr 10, 1956  Age: 68 y.o. MRN#: [id number] Attending Physician: Isadora Hose, MD Primary Care Physician: Autry Grayce LABOR, PA Admit Date: 05/29/2024  Reason for Consultation/Follow-up: Establishing goals of care  Subjective: Notes and labs reviewed.  Chest x-ray reviewed.  Case discussed with CCM in depth, including changes and escalation of care.   In to see patient.  He is currently resting in bed at this time, no family at bedside.  He discusses the updates that he has been provided, and the continued poor prognosis.  He again states he is not afraid of death, and has had a good life.  He again states he is a man of faith, as is his family.   Active listening provided.  We discussed his diagnoses. We discussed his current care and changes to his care in depth. We discussed different scenarios, and questions were answered as able. We discussed prognosis, goals of care, and end-of-life wishes.  He states if he should transition to comfort care, he would like to go home with hospice if possible.  We discussed his current vasopressor needs and the changes that would occur with withdrawing medications.  We discussed possible hospital death.    Discussed speaking with his family yesterday, and again today.  He states he is still processing and planning in his own mind, and would like to do this prior to involving his family.  He states he is coming to a place of wanting to have a conversation including his wife and daughters, and the care team.  He states he would like to give more thought to how he would like to approach this prior to arranging it.  He states he knows this conversation will be difficult for his  children.  He confirms DNR.  He states he would want to be placed on a ventilator short-term, no tracheostomy.  He would never want a feeding tube.  He would like to continue other care at this time.  Nurse updated.  Length of Stay: 14  Current Medications: Scheduled Meds:   vitamin C   500 mg Oral BID   Chlorhexidine  Gluconate Cloth  6 each Topical Daily   epoetin  alfa-epbx (RETACRIT ) injection  10,000 Units Subcutaneous Q T,Th,Sat-1800  fluconazole  200 mg Oral QHS   heparin  injection (subcutaneous)  5,000 Units Subcutaneous Q12H   magic mouthwash w/lidocaine   10 mL Oral TID   metroNIDAZOLE  500 mg Oral Q8H   multivitamin  1 tablet Oral QHS   pantoprazole  (PROTONIX ) IV  40 mg Intravenous Q12H   Ensure Max Protein  11 oz Oral BID    Continuous Infusions:  albumin  human Stopped (06/12/24 0539)   norepinephrine  (LEVOPHED ) Adult infusion 18 mcg/min (06/12/24 0600)   piperacillin -tazobactam (ZOSYN )  IV     vancomycin  2,000 mg (06/12/24 1203)   vasopressin  0.03 Units/min (06/12/24 0600)    PRN Meds: alteplase , alteplase , docusate sodium , heparin , heparin , mouth rinse, polyethylene glycol  Physical Exam Constitutional:      Comments: No distress noted  Pulmonary:     Effort: Pulmonary effort is normal.  Neurological:     Mental Status: He is alert.             Vital Signs: BP (!) 94/48   Pulse 97   Temp 98.8 F (37.1 C) (Oral)   Resp 18   Ht 5' 9 (1.753 m)   Wt 99.7 kg Comment: bed scale broken, previous weight documented  SpO2 (!) 89%   BMI 32.46 kg/m  SpO2: SpO2: (!) 89 % O2 Device: O2 Device: Nasal Cannula O2 Flow Rate: O2 Flow Rate (L/min): 6 L/min  Intake/output summary:  Intake/Output Summary (Last 24 hours) at 06/12/2024 1231 Last data filed at 06/12/2024 0600 Gross per 24 hour  Intake 250.34 ml  Output 1200 ml  Net -949.66 ml   LBM: Last BM Date : 06/09/24 Baseline Weight: Weight: 113.2 kg Most recent weight: Weight: 99.7 kg (bed scale broken,  previous weight documented)    Patient Active Problem List   Diagnosis Date Noted   Medical orders for scope of treatment form in chart 06/10/2024   Gastrointestinal hemorrhage 06/04/2024   Melena 05/31/2024   Septic shock (HCC) 05/29/2024   Encounter for dialysis and dialysis catheter care 05/22/2024   Generalized weakness 05/20/2024   Diarrhea 05/19/2024   Portal vein thrombosis 05/19/2024   Community acquired pneumonia of right lower lobe of lung 05/17/2024   Shock circulatory (HCC) 05/15/2024   ABLA (acute blood loss anemia) 03/08/2024   Acute on chronic diastolic CHF (congestive heart failure) (HCC) 03/08/2024   Hyperlipidemia 03/08/2024   Paroxysmal atrial fibrillation (HCC) 03/08/2024   Iron  deficiency anemia 07/17/2023   Symptomatic anemia 09/09/2022   Anemia 12/19/2021   Alcoholic cirrhosis of liver with ascites (HCC) 12/13/2021   Pre-diabetes 12/13/2021   OSA (obstructive sleep apnea) 12/13/2021   Hypertension 12/13/2021   Wound infection after surgery 12/12/2021   PAF (paroxysmal atrial fibrillation) (HCC)    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 11/26/2021   Peripheral arterial disease 11/26/2021   Chronic gouty arthritis 04/16/2019   Encounter for long-term (current) use of high-risk medication 04/16/2019   Stage 3b chronic kidney disease (CKD) (HCC) 03/05/2019   Hyperuricemia 03/05/2019   Cellulitis of left lower extremity 02/18/2019   Aortic atherosclerosis 04/10/2018   Class 3 obesity (HCC) 04/10/2018   CAD (coronary artery disease), native coronary artery 04/08/2018   COPD (chronic obstructive pulmonary disease) (HCC) 04/08/2018   Acute renal failure 04/08/2018   Hx of adenomatous colonic polyps 08/29/2016     Recommendations/Plan: PMT will follow-up tomorrow.  Code Status:    Code Status Orders  (From admission, onward)           Start  Ordered   06/10/24 1527  Do not attempt resuscitation (DNR) Pre-Arrest  Interventions Desired  (Code Status)  Continuous       Question Answer Comment  If pulseless and not breathing No CPR or chest compressions.   In Pre-Arrest Conditions (Patient Has Pulse and Is Breathing) May intubate, use advanced airway interventions and cardioversion/ACLS medications if appropriate or indicated. May transfer to ICU.   Consent: Discussion documented in EHR or advanced directives reviewed      06/10/24 1526           Code Status History     Date Active Date Inactive Code Status Order ID Comments User Context   05/29/2024 1444 06/10/2024 1526 Full Code [id number]  Terrace Robet RIGGERS ED   05/15/2024 1948 05/25/2024 0107 Full Code [id number]  Rust-Chester, Jenita CROME, NP ED   03/08/2024 2045 03/11/2024 2001 Full Code [id number]  Mansy, Madison LABOR, MD ED   08/08/2023 1242 08/08/2023 2131 Full Code [id number]  Serene Gaile ORN, MD Inpatient   09/09/2022 1624 09/13/2022 2230 Full Code [id number]  Cox, Amy N, DO ED   12/12/2021 2223 12/24/2021 2143 Full Code [id number]  Eliza Lonni RAMAN, MD ED   11/26/2021 1757 12/06/2021 2307 Full Code [id number]  Bethanie Donnice RIGGERS Inpatient   10/20/2021 1506 10/21/2021 0015 Full Code [id number]  Darron Deatrice LABOR, MD Inpatient   02/18/2019 0432 02/22/2019 1736 Full Code [id number]  Seals, Jon DEL, NP ED      Camelia Lewis, NP  Please contact Palliative Medicine Team phone at [phone] for questions and concerns.

## 2024-06-13 LAB — PHOSPHORUS: Phosphorus: 6.5 mg/dL — ABNORMAL HIGH (ref 2.5–4.6)

## 2024-06-13 LAB — COMPREHENSIVE METABOLIC PANEL WITH GFR
ALT: 193 U/L — ABNORMAL HIGH (ref 0–44)
AST: 422 U/L — ABNORMAL HIGH (ref 15–41)
Albumin: 2.8 g/dL — ABNORMAL LOW (ref 3.5–5.0)
Alkaline Phosphatase: 132 U/L — ABNORMAL HIGH (ref 38–126)
Anion gap: 19 — ABNORMAL HIGH (ref 5–15)
BUN: 56 mg/dL — ABNORMAL HIGH (ref 8–23)
CO2: 17 mmol/L — ABNORMAL LOW (ref 22–32)
Calcium: 8.4 mg/dL — ABNORMAL LOW (ref 8.9–10.3)
Chloride: 97 mmol/L — ABNORMAL LOW (ref 98–111)
Creatinine, Ser: 3.79 mg/dL — ABNORMAL HIGH (ref 0.61–1.24)
GFR, Estimated: 17 mL/min — ABNORMAL LOW (ref >60)
Glucose, Bld: 79 mg/dL (ref 70–99)
Potassium: 5.2 mmol/L — ABNORMAL HIGH (ref 3.5–5.1)
Sodium: 133 mmol/L — ABNORMAL LOW (ref 135–145)
Total Bilirubin: 11.8 mg/dL — ABNORMAL HIGH (ref 0.0–1.2)
Total Protein: 5 g/dL — ABNORMAL LOW (ref 6.5–8.1)

## 2024-06-13 LAB — CBC
HCT: 28.8 % — ABNORMAL LOW (ref 39.0–52.0)
Hemoglobin: 8.8 g/dL — ABNORMAL LOW (ref 13.0–17.0)
MCH: 32.1 pg (ref 26.0–34.0)
MCHC: 30.6 g/dL (ref 30.0–36.0)
MCV: 105.1 fL — ABNORMAL HIGH (ref 80.0–100.0)
Platelets: 113 K/uL — ABNORMAL LOW (ref 150–400)
RBC: 2.74 MIL/uL — ABNORMAL LOW (ref 4.22–5.81)
RDW: 28.4 % — ABNORMAL HIGH (ref 11.5–15.5)
WBC: 33.6 K/uL — ABNORMAL HIGH (ref 4.0–10.5)
nRBC: 2.9 % — ABNORMAL HIGH (ref 0.0–0.2)

## 2024-06-13 LAB — GLUCOSE, CAPILLARY
Glucose-Capillary: 79 mg/dL (ref 70–99)
Glucose-Capillary: 68 mg/dL — ABNORMAL LOW (ref 70–99)

## 2024-06-13 LAB — LACTIC ACID, PLASMA: Lactic Acid, Venous: 7.9 mmol/L (ref 0.5–1.9)

## 2024-06-13 LAB — MAGNESIUM: Magnesium: 2.9 mg/dL — ABNORMAL HIGH (ref 1.7–2.4)

## 2024-06-13 MED ORDER — VANCOMYCIN HCL IN DEXTROSE 1-5 GM/200ML-% IV SOLN: 1000.0000 mg | INTRAVENOUS | Status: DC | PRN

## 2024-06-13 MED ORDER — EPINEPHRINE HCL 5 MG/250ML IV SOLN IN NS
0.5000 ug/min | INTRAVENOUS | Status: DC
  Administered 2024-06-13: 0.5 ug/min via INTRAVENOUS
  Filled 2024-06-13: qty 250

## 2024-06-13 MED ORDER — DEXTROSE 50 % IV SOLN
1.0000 | Freq: Once | INTRAVENOUS | Status: AC
  Administered 2024-06-13: 50 mL via INTRAVENOUS
  Filled 2024-06-13: qty 50

## 2024-06-17 LAB — CULTURE, BLOOD (ROUTINE X 2)
Culture: NO GROWTH
Culture: NO GROWTH
Special Requests: ADEQUATE
Special Requests: ADEQUATE

## 2024-06-29 NOTE — Progress Notes (Signed)
 Pt was placed on HHFNC 45L and 80% FiO2. began to desat into the 80s, and have increased work of breathing. Placed on BiPAP 100% FiO2 now O2 sats are now in the 90s, given 2mg  of morphine  for air hunger.

## 2024-06-29 NOTE — Progress Notes (Signed)
   July 09, 2024 0800  Spiritual Encounters  Type of Visit Follow up  Care provided to: [facility] partners present during encounter Other (comment) Training and development officer)  Reason for visit End-of-life  OnCall Visit Yes   Chaplain was walking past the [facility] and found patient's stepdaughter inside crying.  Chaplain asked if this person needed care and she shared that her stepfather died.  Chaplain asked the room number to confirm that it was room ICU 10.  Chaplain offered to pray with the family member who said others were on the way.  Chaplain prayed and shared with patient that her colleague would come to check on the family shortly at shift change.  Chaplain also called the ICU Secretary to let her know that Chaplain met the stepdaughter, prayed and that she was sharing with her colleague so she can visit the family.    Rev. Rana M. Nicholaus, M.Div. Chaplain Resident [facility]

## 2024-06-29 NOTE — Progress Notes (Signed)
 Patient pronounced by Nena PEAK and Myra RN at 7:54. Patient asystole per EKG, no  respirations noted during auscultation, no palpable pulse. MD to call family for update. Supervisor notified and E-link to call honor bridge.

## 2024-06-29 NOTE — Plan of Care (Signed)
  Problem: Education: Goal: Knowledge of General Education information will improve Description: Including pain rating scale, medication(s)/side effects and non-pharmacologic comfort measures Outcome: Progressing   Problem: Clinical Measurements: Goal: Cardiovascular complication will be avoided Outcome: Progressing   Problem: Activity: Goal: Risk for activity intolerance will decrease Outcome: Progressing   Problem: Coping: Goal: Level of anxiety will decrease Outcome: Progressing   Problem: Pain Managment: Goal: General experience of comfort will improve and/or be controlled Outcome: Progressing   Problem: Safety: Goal: Ability to remain free from injury will improve Outcome: Progressing   Problem: Skin Integrity: Goal: Risk for impaired skin integrity will decrease Outcome: Progressing   Problem: Clinical Measurements: Goal: Ability to maintain clinical measurements within normal limits will improve Outcome: Not Progressing Goal: Diagnostic test results will improve Outcome: Not Progressing Goal: Respiratory complications will improve Outcome: Not Progressing   Problem: Nutrition: Goal: Adequate nutrition will be maintained Outcome: Not Progressing

## 2024-06-29 NOTE — Progress Notes (Signed)
 Patient unfortunately actively dying and has been made DNR.  Patient did not want any further dialysis based upon discussion with the critical care team.  Thank you for allowing us  to participate in his care.

## 2024-06-29 NOTE — Progress Notes (Signed)
 Brief Progress Note   Notified pts daughter Kevin Gross via telephone pt is actively dying and informed her she needed to come to the hospital.  She states she is en route to the hospital, but she is an hour away.  I told Ms. Gross Kevin Gross may pass away prior to her arriving at the hospital.  She understands the situation.  Pts spouse also contact by RN informing her of change in pts condition.  She is also en route to the hospital.  Will continue to monitor and assess pt.   Kevin Gross, Kevin Gross  Pulmonary/Critical Care Pager [phone] (please enter 7 digits) PCCM Consult Pager [phone] (please enter 7 digits)

## 2024-06-29 NOTE — Progress Notes (Signed)
 Contacted patient's spouse and children listed in the chart regarding patient's change in heart rhythm and steady decline in respiratory strength. Family stated that they would come to the hospital to see the patient as soon as possible.  Arlean FORBES Bowers, RN

## 2024-06-29 NOTE — Progress Notes (Signed)
   2024-06-17 0830  Spiritual Encounters  Type of Visit Initial  Care provided to: [facility] partners present during encounter Nurse  Referral source Chaplain team  Reason for visit Patient death  OnCall Visit No  Interventions  Spiritual Care Interventions Made Established relationship of care and support;Compassionate presence;Reflective listening;Bereavement/grief support  Intervention Outcomes  Outcomes Connection to spiritual care;Awareness of support  Spiritual Care Plan  Spiritual Care Issues Still Outstanding No further spiritual care needs at this time (see row info)   Chaplain received referral from on-call chaplain. Chaplain went to room to comfort the family.

## 2024-06-29 NOTE — Death Summary Note (Signed)
 DEATH SUMMARY   Patient Details  Name: Kevin Gross MRN: [id number] DOB: [birth year]  Admission/Discharge Information   Admit Date:  2024-06-24  Date of Death: 07-09-24  Time of Death: 07:45  Length of Stay: Jul 08, 2024  Referring Physician: Autry Grayce LABOR, PA   Reason(s) for Hospitalization  Septic Shock, ESLD, ESRD  Diagnoses  Preliminary cause of death:  Secondary Diagnoses (including complications and co-morbidities):  Principal Problem:   Septic shock [facility]) Active Problems:   Melena   Gastrointestinal hemorrhage   Medical orders for scope of treatment form in chart   Brief Hospital Course (including significant findings, care, treatment, and services provided and events leading to death)  Kevin Gross is a 68 y.o. year old male who has a history of alcoholic liver cirrhosis, end stage liver disease, end stage renal disease, and portal vein thrombosis who presented to the hospital with weakness. Patient was recently admitted in September with decompensated liver cirrhosis and renal failure and was discharged and PVT was diagnosed and he was started on dialysis.  During this admission, he was treated for renal failure and decompensated liver cirrhosis. He was also treated for GI bleed with anti-coagulation stopped. He was also diagnosed with candida esophagitis as well as trichomonal esophagitis. He was also treated with broad spectrum antibiotics and SBP was ruled out. Over the past 48 hours, his vasopressor requirements increased, with increased oxygen requirements suggestive of an aspiration pneumonia. His lactic acid was elevated and he had metabolic acidosis, with escalating ventilatory requirements (nasal cannula, hi-flo, BiPAP). We had multiple family meetings with the patient as well as his family, with palliative care involved. We explained the gravity of his disease and overall poor prognosis. Patient elected to be DNR/DNI, and was considering comfort care measures. His  condition worsened overnight, with increase vasopressor requirements (added epinephrine  to nor-epi and vasopressin ). This morning, his conditioned worsened with worsening acidosis. Patient eventually succumbed to his disease and passed away.  Physical Exam Constitutional:      General: He is in acute distress.     Appearance: He is ill-appearing.  Cardiovascular:     Rate and Rhythm: Regular rhythm. Tachycardia present.     Pulses: Normal pulses.     Heart sounds: Normal heart sounds.  Pulmonary:     Effort: Respiratory distress present.     Breath sounds: Rhonchi present. No wheezing or rales.  Neurological:     Mental Status: He is disoriented.     Motor: Weakness present.      #Circulatory Shock  #Sepsis #Acute Decompensated Liver Cirrhosis  #EtOH Liver Cirrhosis #Acute Hypoxic Respiratory Failure #Aspiration Pneumonia #ESLD #Trichomonas Esophagitis #Esophageal and Oropharyngeal Candidiasis #Portal Vein Thrombosis - resolved #ESRD on HD #GI Bleed #Protein Calorie Malnutrition #PAD  #Afib #HFpEF   Neuro - Encephalopathy secondary to sepsis and septic shock as well as acidosis. Patient DNI.   CV - Admitted with hypotension that is felt to be distributive. He was found to have esophageal candidiasis and trichomonas, treated with anti-microbials. SBP ruled out, and he has received broad spectrum antibiotics. Most recent echo 05/16/24 with normal RV/LV function, but with LV hypertrophy. Developed progressive septic shock secondary to aspiration pneumonia with possible contribution from vasoplegia due to liver cirrhosis, low oncotic pressure (malnutrition/liver disease), and renal failure. Patient DNR code status.   Pulm - small pleural effusion previously noted on chest xray. Oxygen requirements increased and CXR might suggest a subtle aspiration pneumonia on the left. Increased oxygen requirements with escalation to  BiPAP. Patient DNI code status.  GI - EtOH liver cirrhosis,  with decompensation in the form of notable ascites. No varices noted. Recent portal vein thrombosis that likely further contributed to decompensation. His LFT's continue to rise, with bilirubin up to 11 today. Overall poor prognosis given severity of liver disease/ESLD; palliative care consulted, input appreciated.   MELD 3.0: 39 at June 26, 2024  5:10 AM MELD-Na: 39 at 06-26-24  5:10 AM Calculated from: Serum Creatinine: 3.79 mg/dL (Using max of 3 mg/dL) at 89/83/7974  4:89 AM Serum Sodium: 133 mmol/L at 06-26-24  5:10 AM Total Bilirubin: 11.8 mg/dL at 89/83/7974  4:89 AM Serum Albumin : 2.8 g/dL at 89/83/7974  4:89 AM INR(ratio): 2.6 at 06/12/2024  5:05 AM Age at listing (hypothetical): 86 years Sex: Male at 2024/06/26  5:10 AM   Renal - ESRD currently on HD. Appreciate input from nephrology. Considered transition back to CRRT but patient with progressive shock and succumbed to his disease this AM.   Endo - while he did have functional adrenal insufficiency during his prior admission, his AM cortisol level was normal this admit, and again normal on repeat yesterday.   Hem/Onc - portal vein thrombosis noted on previous admission, resolved on repeat imaging this admission. AC for PVT is in general a 6 month course, but given his frailty and GI bleed/anemia during this hospitalization this has been held. Would consider resuming if his H/H stabilize. Resumed DVT prophylaxis.   ID - esophageal and oropharyngeal candidiasis, in addition to trichomonas (?tenax or pentatrichomas hominis per ID) in the esophageal brushing. On Fluconazole (14 to 21 days minimum) and Flagyl (10-14 days). Given increased vasopressor requirements and indwelling central venous catheters (L. Internal jugular CVC placed 05/29/2024); R. Internal jugular tunneled dialysis catheter placed 05/21/2024), will need to rule out a blood borne pathogen with repeat blood cultures. Restarted broad spectrum antibiotics with Vancomycin  and  Zosyn  for coverage of blood borne infection and aspiration pneumonia.   Pertinent Labs and Studies  Significant Diagnostic Studies DG Chest Port 1 View Result Date: 06/12/2024 EXAM: 1 VIEW(S) XRAY OF THE CHEST 06/12/2024 03:23:29 PM COMPARISON: Comparison with exam of 06/12/2024. CLINICAL HISTORY: [id number] Acute hypoxic respiratory failure (HCC) [id number]. Increase of SOB since this morning's portable Increase of SOB since this morning's portable FINDINGS: LINES, TUBES AND DEVICES: Stable bilateral internal jugular catheters. LUNGS AND PLEURA: Stable bibasilar opacities consistent with atelectasis possibly pneumonia. Small left pleural effusion. No pulmonary edema. No pneumothorax. HEART AND MEDIASTINUM: Stable cardiomediastinal silhouette. BONES AND SOFT TISSUES: No acute osseous abnormality. IMPRESSION: 1. Stable bibasilar opacities, most consistent with atelectasis; pneumonia is less likely but not excluded. 2. Small left pleural effusion. Electronically signed by: Lynwood Seip MD 06/12/2024 03:33 PM EDT RP Workstation: [id number]   DG Chest Port 1 View Result Date: 06/12/2024 EXAM: 1 VIEW(S) XRAY OF THE CHEST 06/12/2024 08:56:00 AM COMPARISON: 06/08/2024 CLINICAL HISTORY: Shortness of breath. FINDINGS: LINES, TUBES AND DEVICES: Bilateral internal jugular catheters are unchanged. LUNGS AND PLEURA: Stable mild bibasilar atelectasis. Stable small left pleural effusion. No focal pulmonary opacity. No pulmonary edema. No pneumothorax. HEART AND MEDIASTINUM: Stable cardiomediastinal silhouette. BONES AND SOFT TISSUES: No acute osseous abnormality. IMPRESSION: 1. Stable mild bibasilar atelectasis. 2. Small left pleural effusion. Electronically signed by: Lynwood Seip MD 06/12/2024 09:24 AM EDT RP Workstation: [id number]   DG Chest Port 1 View Result Date: 06/08/2024 EXAM: 1 VIEW(S) XRAY OF THE CHEST 06/08/2024 05:13:00 AM COMPARISON: 05/29/2024 CLINICAL HISTORY: Pneumonia FINDINGS: LINES, TUBES AND DEVICES:  Right chest central venous  catheter in place with tip terminating over the right atrium. Left IJ central venous catheter in place with tip terminating over the superior cavoatrial junction. LUNGS AND PLEURA: Low lung volumes. Small left pleural effusion. Left basilar homogeneous opacification, likely atelectasis. Likely right basilar atelectasis. No pulmonary edema. No pneumothorax. HEART AND MEDIASTINUM: Atherosclerotic calcifications. No acute abnormality of the cardiac and mediastinal silhouettes. BONES AND SOFT TISSUES: No acute osseous abnormality. IMPRESSION: 1. Small left pleural effusion with left basilar opacification, likely atelectasis. 2. Probable right basilar atelectasis. Electronically signed by: Waddell Calk MD 06/08/2024 05:43 AM EDT RP Workstation: [id number]   US  ABDOMINAL PELVIC ART/VENT FLOW DOPPLER LIMITED Result Date: 05/30/2024 CLINICAL DATA:  History of portal vein thrombosis EXAM: LIMITED DUPLEX ULTRASOUND OF LIVER TECHNIQUE: Color and duplex Doppler ultrasound was performed to evaluate the hepatic in-flow and out-flow vessels. COMPARISON:  Ultrasound 05/17/2024, CT 05/29/2024 FINDINGS: Liver: Cirrhotic morphology of the liver. No focal lesion, mass or intrahepatic biliary ductal dilatation. Main Portal Vein size: 0.9 cm Portal Vein Velocities Main Prox:  19.5 cm/sec Main Mid: 28.9 cm/sec Main Dist:  34.1 cm/sec Right: 32.7 cm/sec Left: 20 cm/sec Hepatic Vein Velocities Not evaluated IVC: Not evaluated Hepatic Artery Velocity: Not evaluated Splenic Vein Velocity: Not a by Portal Vein Occlusion/Thrombus: No Splenic Vein Occlusion/Thrombus: Not assessed Ascites: Moderate to large volume of ascites within the abdomen and pelvis. Hepatopetal flow within the imaged portal veins. IMPRESSION: 1. Cirrhotic morphology of the liver. Moderate to large volume of ascites. 2. Patent portal veins with hepatopetal flow. Previously noted portal vein thrombus has resolved Electronically Signed   By: Luke Bun M.D.   On: 05/30/2024 20:00   CT CHEST ABDOMEN PELVIS WO CONTRAST Result Date: 05/29/2024 CLINICAL DATA:  Septic shock without clear source new end-stage renal disease EXAM: CT CHEST, ABDOMEN AND PELVIS WITHOUT CONTRAST TECHNIQUE: Multidetector CT imaging of the chest, abdomen and pelvis was performed following the standard protocol without IV contrast. RADIATION DOSE REDUCTION: This exam was performed according to the departmental dose-optimization program which includes automated exposure control, adjustment of the mA and/or kV according to patient size and/or use of iterative reconstruction technique. COMPARISON:  Ultrasound 05/17/2024, CT 05/15/2024, chest x-ray 05/29/2024, chest CT 11/08/2023 FINDINGS: CT CHEST FINDINGS Cardiovascular: Limited without intravenous contrast. Moderate aortic atherosclerosis. 4.1 cm diameter ascending aorta. Multi-vessel coronary vascular calcification. No sizable pericardial effusion. Left-sided IJ central venous catheter with tip cough in the SVC. Right sided dialysis catheter with tips at the SVC and cavoatrial junction. Mediastinum/Nodes: Patent trachea. No thyroid mass. No suspicious lymph nodes. Esophagus within normal limits Lungs/Pleura: Mild emphysema. Small left greater than right pleural effusions. Dependent atelectasis in the left lower lobe. Patchy foci of ground-glass disease in the upper lobes and right middle lobe. No pneumothorax. Musculoskeletal: Sternum appears intact. No acute osseous abnormality. CT ABDOMEN PELVIS FINDINGS Hepatobiliary: Liver cirrhosis. Distended gallbladder with small stones. No biliary dilatation Pancreas: Unremarkable. No pancreatic ductal dilatation or surrounding inflammatory changes. Spleen: Normal in size without focal abnormality. Adrenals/Urinary Tract: Adrenal glands are normal. No hydronephrosis. Several slightly dense exophytic lesions left kidney, largest off the superior pole and measuring 2.6 cm. The bladder is  slightly thick walled but decompressed Stomach/Bowel: The stomach is within normal limits. Few mildly dilated loops of jejunum in the left upper quadrant but without obstruction. No definitive acute bowel wall thickening. Scattered diverticular disease of the colon Vascular/Lymphatic: Advanced aortic atherosclerosis. No definitive aneurysm. No suspicious lymph nodes. Postsurgical changes at the left groin. Reproductive: Prostate seeds  Other: Negative for free air. Moderate ascites in the abdomen and pelvis but overall decreased compared to the CT from September. Musculoskeletal: No acute or suspicious osseous abnormality. IMPRESSION: 1. Small left greater than right pleural effusions with dependent atelectasis in the left lower lobe. Patchy foci of ground-glass disease in the upper lobes and right middle lobe, suspect infectious or inflammatory. 2. Liver cirrhosis. Moderate ascites in the abdomen and pelvis but overall decreased compared to the CT from mid September. 3. Distended gallbladder with small stones. 4. Several slightly dense exophytic lesions left kidney, largest off the superior pole and measuring 2.6 cm. These are indeterminate in the absence of contrast. When the patient is clinically stable and able to follow directions and hold their breath (preferably as an outpatient) further evaluation with dedicated abdominal MRI should be considered. 5. Aortic atherosclerosis. Maximum ascending aortic diameter of 4.1 cm. Recommend annual imaging followup by CTA or MRA. This recommendation follows 2010 ACCF/AHA/AATS/ACR/ASA/SCA/SCAI/SIR/STS/SVM Guidelines for the Diagnosis and Management of Patients with Thoracic Aortic Disease. Circulation. 2010; 121: Z733-z630. Aortic aneurysm NOS (ICD10-I71.9) Aortic Atherosclerosis (ICD10-I70.0). Electronically Signed   By: Luke Bun M.D.   On: 05/29/2024 18:26   DG Chest Port 1 View Result Date: 05/29/2024 CLINICAL DATA:  Central line EXAM: PORTABLE CHEST 1 VIEW  COMPARISON:  05/29/2024 FINDINGS: Right-sided central venous catheter tips at the right atrial level. Left IJ central venous catheter tip at the proximal right atrium. Cardiomegaly with vascular congestion. Possible small left effusion. Increasing airspace disease left base. Aortic atherosclerosis. No definitive pneumothorax. IMPRESSION: 1. Right-sided central venous catheter tips at the atrial level. Left IJ central venous catheter tip at the proximal right atrium. No definitive pneumothorax. 2. Cardiomegaly with vascular congestion. Increasing airspace disease at the left base which may be due to atelectasis or pneumonia. Small left effusion Electronically Signed   By: Luke Bun M.D.   On: 05/29/2024 17:35   DG Chest Portable 1 View Result Date: 05/29/2024 CLINICAL DATA:  Weakness.  Hypotensive. EXAM: PORTABLE CHEST 1 VIEW COMPARISON:  05/15/2024. FINDINGS: The heart size and mediastinal contours are within normal limits. Status post placement of right IJ double-lumen hemodialysis catheter with tips overlying the lower SVC and superior cavoatrial junction. Aortic atherosclerosis. Small left pleural effusion, similar to the prior exam. No overt pulmonary edema, focal consolidation, or pneumothorax. No acute osseous abnormality. IMPRESSION: 1. Small left pleural effusion, similar to the prior exam. 2. Interval placement of right IJ double-lumen hemodialysis catheter. Electronically Signed   By: Harrietta Sherry M.D.   On: 05/29/2024 13:50   PERIPHERAL VASCULAR CATHETERIZATION Result Date: 05/21/2024 See surgical note for result.  US  LIVER DOPPLER Result Date: 05/18/2024 CLINICAL DATA:  Cirrhosis and ascites. Status post large volume paracentesis on 05/16/2024. EXAM: DUPLEX ULTRASOUND OF LIVER TECHNIQUE: Color and duplex Doppler ultrasound was performed to evaluate the hepatic in-flow and out-flow vessels. COMPARISON:  CT abdomen 05/15/2024 FINDINGS: Liver: The liver demonstrates cirrhotic morphology  with surface nodularity and right lobe atrophy. No focal lesion, mass or intrahepatic biliary ductal dilatation. Main Portal Vein size: 1.6 cm Portal Vein Velocities Main Prox:  23 cm/sec Main Mid: 24 cm/sec Main Dist:  16 cm/sec Right: Not able to be sampled. Left: 13 cm/sec Hepatic Vein Velocities Right:  21 cm/sec Middle:  26 cm/sec Left:  19 cm/sec IVC: Present and patent with normal respiratory phasicity. Hepatic Artery Velocity:  77 cm/sec Splenic Vein Velocity:  10 cm/sec Spleen: 15.7 cm x 14.3 cm x 5.2 cm with a total  volume of 614 cm^3 (411 cm^3 is upper limit normal) Portal Vein Occlusion/Thrombus: Yes Splenic Vein Occlusion/Thrombus: No Ascites: Present Varices: None Thrombus identified in the main portal vein that likely extends into the intrahepatic right portal vein. The thrombus appears to be nonocclusive with some flow present in the main portal vein and intrahepatic left portal vein but the thrombus may be on the verge of becoming completely occlusive. No evidence of hepatic veno-occlusive disease. The splenic vein is patent. Small amount of ascites present in the right upper quadrant. There is moderate splenomegaly. IMPRESSION: 1. Cirrhosis of the liver with evidence of portal hypertension including splenomegaly and ascites. 2. 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 The thrombus may be on the verge of becoming completely occlusive. Electronically Signed   By: Marcey Moan M.D.   On: 05/18/2024 10:07   US  Paracentesis Result Date: 05/16/2024 INDICATION: Patient admitted with history of ETOH cirrhosis, CHF who is currently admitted to the ICU for shock/sepsis. Noted to have ascites on recent imaging. Request for diagnostic and therapeutic paracentesis. EXAM: ULTRASOUND GUIDED DIAGNOSTIC AND THERAPEUTIC PARACENTESIS MEDICATIONS: 5 mL 1% lidocaine  COMPLICATIONS: None immediate. PROCEDURE: Informed written consent was obtained  from the patient after a discussion of the risks, benefits and alternatives to treatment. A timeout was performed prior to the initiation of the procedure. Initial ultrasound scanning demonstrates a large amount of ascites within the left lower abdominal quadrant. The left lower abdomen was prepped and draped in the usual sterile fashion. 1% lidocaine  was used for local anesthesia. Following this, a 5 Fr, 7-cm Merit OneStep Centesis catheter was introduced. An ultrasound image was saved for documentation purposes. The paracentesis was performed. The catheter was removed and a dressing was applied. The patient tolerated the procedure well without immediate post procedural complication. FINDINGS: A total of approximately 4.8 L of clear yellow fluid was removed. Samples were sent to the laboratory as requested by the clinical team. IMPRESSION: Successful ultrasound-guided paracentesis yielding 4.8 liters of peritoneal fluid. Performed by Clotilda Hesselbach, PA-C Electronically Signed   By: Juliene Balder M.D.   On: 05/16/2024 16:36   ECHOCARDIOGRAM COMPLETE Result Date: 05/16/2024    ECHOCARDIOGRAM REPORT   Patient Name:   Kevin Gross Date of Exam: 05/16/2024 Medical Rec #:  [id number]       Height:       69.0 in Accession #:    [id number]      Weight:       254.9 lb Date of Birth:  1955-09-20       BSA:          2.290 m Patient Age:    68 years        BP:           103/39 mmHg Patient Gender: M               HR:           57 bpm. Exam Location:  [facility] Procedure: 2D Echo, Cardiac Doppler and Color Doppler (Both Spectral and Color            Flow Doppler were utilized during procedure). Indications:     Congestive heart failure I50.9  History:         Patient has prior history of Echocardiogram examinations, most                  recent 03/09/2024. CHF; Risk Factors:Hypertension and Sleep  Apnea.  Sonographer:     Christopher Furnace Referring Phys:  [id number] Bronislaw Switzer Diagnosing Phys: Evalene Lunger MD  IMPRESSIONS  1. Left ventricular ejection fraction, by estimation, is 60 to 65%. The left ventricle has normal function. The left ventricle has no regional wall motion abnormalities. The left ventricular internal cavity size was mildly dilated. There is mild left ventricular hypertrophy. Left ventricular diastolic parameters were normal.  2. Right ventricular systolic function is normal. The right ventricular size is normal. There is normal pulmonary artery systolic pressure. The estimated right ventricular systolic pressure is 26.9 mmHg.  3. Left atrial size was mildly dilated.  4. The mitral valve is normal in structure. No evidence of mitral valve regurgitation. No evidence of mitral stenosis.  5. The aortic valve is normal in structure. Aortic valve regurgitation is not visualized. No aortic stenosis is present.  6. The inferior vena cava is normal in size with greater than 50% respiratory variability, suggesting right atrial pressure of 3 mmHg. FINDINGS  Left Ventricle: Left ventricular ejection fraction, by estimation, is 60 to 65%. The left ventricle has normal function. The left ventricle has no regional wall motion abnormalities. Strain was performed and the global longitudinal strain is indeterminate. The left ventricular internal cavity size was mildly dilated. There is mild left ventricular hypertrophy. Left ventricular diastolic parameters were normal. Right Ventricle: The right ventricular size is normal. No increase in right ventricular wall thickness. Right ventricular systolic function is normal. There is normal pulmonary artery systolic pressure. The tricuspid regurgitant velocity is 2.34 m/s, and  with an assumed right atrial pressure of 5 mmHg, the estimated right ventricular systolic pressure is 26.9 mmHg. Left Atrium: Left atrial size was mildly dilated. Right Atrium: Right atrial size was normal in size. Pericardium: There is no evidence of pericardial effusion. Mitral Valve: The mitral valve  is normal in structure. No evidence of mitral valve regurgitation. No evidence of mitral valve stenosis. MV peak gradient, 4.8 mmHg. The mean mitral valve gradient is 2.0 mmHg. Tricuspid Valve: The tricuspid valve is normal in structure. Tricuspid valve regurgitation is mild . No evidence of tricuspid stenosis. Aortic Valve: The aortic valve is normal in structure. Aortic valve regurgitation is not visualized. No aortic stenosis is present. Aortic valve mean gradient measures 7.0 mmHg. Aortic valve peak gradient measures 12.0 mmHg. Aortic valve area, by VTI measures 2.21 cm. Pulmonic Valve: The pulmonic valve was normal in structure. Pulmonic valve regurgitation is not visualized. No evidence of pulmonic stenosis. Aorta: The aortic root is normal in size and structure. Venous: The inferior vena cava is normal in size with greater than 50% respiratory variability, suggesting right atrial pressure of 3 mmHg. IAS/Shunts: No atrial level shunt detected by color flow Doppler. Additional Comments: 3D was performed not requiring image post processing on an independent workstation and was indeterminate.  LEFT VENTRICLE PLAX 2D LVIDd:         5.40 cm   Diastology LVIDs:         3.10 cm   LV e' medial:    11.30 cm/s LV PW:         1.10 cm   LV E/e' medial:  9.2 LV IVS:        1.40 cm   LV e' lateral:   18.40 cm/s LVOT diam:     2.00 cm   LV E/e' lateral: 5.7 LV SV:         87 LV SV Index:   38 LVOT Area:  3.14 cm  RIGHT VENTRICLE RV Basal diam:  3.80 cm RV Mid diam:    4.20 cm RV S prime:     14.30 cm/s TAPSE (M-mode): 2.4 cm LEFT ATRIUM           Index        RIGHT ATRIUM           Index LA diam:      3.60 cm 1.57 cm/m   RA Area:     19.30 cm LA Vol (A2C): 48.4 ml 21.13 ml/m  RA Volume:   49.60 ml  21.66 ml/m LA Vol (A4C): 75.8 ml 33.10 ml/m  AORTIC VALVE AV Area (Vmax):    2.25 cm AV Area (Vmean):   2.10 cm AV Area (VTI):     2.21 cm AV Vmax:           173.00 cm/s AV Vmean:          119.500 cm/s AV VTI:             0.394 m AV Peak Grad:      12.0 mmHg AV Mean Grad:      7.0 mmHg LVOT Vmax:         124.00 cm/s LVOT Vmean:        79.900 cm/s LVOT VTI:          0.278 m LVOT/AV VTI ratio: 0.70  AORTA Ao Root diam: 3.50 cm MITRAL VALVE                TRICUSPID VALVE MV Area (PHT): 3.70 cm     TR Peak grad:   21.9 mmHg MV Area VTI:   2.07 cm     TR Vmax:        234.00 cm/s MV Peak grad:  4.8 mmHg MV Mean grad:  2.0 mmHg     SHUNTS MV Vmax:       1.10 m/s     Systemic VTI:  0.28 m MV Vmean:      62.8 cm/s    Systemic Diam: 2.00 cm MV Decel Time: 205 msec MV E velocity: 104.00 cm/s MV A velocity: 76.50 cm/s MV E/A ratio:  1.36 Evalene Lunger MD Electronically signed by Evalene Lunger MD Signature Date/Time: 05/16/2024/2:54:52 PM    Final    CT ABDOMEN PELVIS WO CONTRAST Result Date: 05/16/2024 CLINICAL DATA:  Right lower quadrant pain EXAM: CT ABDOMEN AND PELVIS WITHOUT CONTRAST TECHNIQUE: Multidetector CT imaging of the abdomen and pelvis was performed following the standard protocol without IV contrast. RADIATION DOSE REDUCTION: This exam was performed according to the departmental dose-optimization program which includes automated exposure control, adjustment of the mA and/or kV according to patient size and/or use of iterative reconstruction technique. COMPARISON:  None Available. FINDINGS: Lower chest: Small left-sided pleural effusion is noted with left basilar atelectasis. Hepatobiliary: Liver is shrunken and nodular consistent with underlying cirrhotic change. Gallbladder is well distended with dependent gallstones. No biliary ductal dilatation is seen. Pancreas: Unremarkable. No pancreatic ductal dilatation or surrounding inflammatory changes. Spleen: Normal in size without focal abnormality. Adrenals/Urinary Tract: Adrenal glands are within normal limits. Kidneys are well visualized bilaterally. No renal calculi or obstructive changes are noted. Bladder is partially distended. Stomach/Bowel: No obstructive or  inflammatory changes of the colon are noted. Scattered diverticular changes seen without evidence of diverticulitis. The appendix is not well visualized. No inflammatory changes to suggest appendicitis are noted. Small bowel and stomach are within normal limits. Vascular/Lymphatic: Aortic atherosclerosis. No enlarged  abdominal or pelvic lymph nodes. Reproductive: Prostate is unremarkable.  Therapy seeds are noted. Other: Mild to moderate ascites is noted. Musculoskeletal: Degenerative changes of the lumbar spine are noted. IMPRESSION: Changes consistent with cirrhotic change of the liver with associated ascites. Left-sided pleural effusion with lower lobe atelectasis. Diverticular change. Electronically Signed   By: Oneil Devonshire M.D.   On: 05/16/2024 00:01   US  Renal Result Date: 05/15/2024 CLINICAL DATA:  Acute renal insufficiency. EXAM: RENAL / URINARY TRACT ULTRASOUND COMPLETE COMPARISON:  None Available. FINDINGS: Evaluation is limited due to body habitus and ascites. Right Kidney: Renal measurements: 10.7 x 6.5 x 5.6 cm = volume: 202 mL. Mild parenchyma atrophy and increased echogenicity. No hydronephrosis or shadowing stone. Two small hyperechoic lesions measure up to 1.3 cm suboptimally evaluated due to small size, possibly cysts. Left Kidney: Renal measurements: 10.4 x 6.6 x 5.7 cm = volume: 204 mL. Mild parenchyma atrophy and increased echogenicity. No hydronephrosis or shadowing stone. A 2.7 cm hypoechoic lesion in the upper pole is suboptimally evaluated, likely a cyst. Bladder: The urinary bladder is not visualized. Other: There is a small ascites. IMPRESSION: 1. Mild parenchyma atrophy and increased echogenicity. No hydronephrosis or shadowing stone. 2. Probable small bilateral renal cysts. 3. Small ascites. Electronically Signed   By: Vanetta Chou M.D.   On: 05/15/2024 18:28   DG Chest 2 View Result Date: 05/15/2024 CLINICAL DATA:  Shortness of breath and dizziness for 1 week. EXAM: DG CHEST  2V COMPARISON:  May 03, 2024. FINDINGS: The heart size and mediastinal contours are within normal limits. Small bilateral pleural effusions are noted. No consolidative process is noted. The visualized skeletal structures are unremarkable. IMPRESSION: Small bilateral pleural effusions. Electronically Signed   By: Lynwood Landy Raddle M.D.   On: 05/15/2024 16:15    Microbiology Recent Results (from the past 240 hours)  KOH prep     Status: None   Collection Time: 06/05/24 10:00 AM   Specimen: Esophagus  Result Value Ref Range Status   Specimen Description ESOPHAGUS  Final   Special Requests Immunocompromised  Final   KOH Prep   Final    YEAST WITH PSEUDOHYPHAE RESULT CALLED TO, READ BACK BY AND VERIFIED WITH: NOTIFIED RACHEL VERDI, RN 1206 06/05/24 THAT TRICHOMONAS WAS ALSO SEEN ON SLIDE  GM Performed at [facility], [address] [place], [place] [address]    Report Status 06/05/2024 FINAL  Final  Gastrointestinal Panel by PCR , Stool     Status: None   Collection Time: 06/05/24  3:04 PM   Specimen: Stool  Result Value Ref Range Status   Campylobacter species NOT DETECTED NOT DETECTED Final   Plesimonas shigelloides NOT DETECTED NOT DETECTED Final   Salmonella species NOT DETECTED NOT DETECTED Final   Yersinia enterocolitica NOT DETECTED NOT DETECTED Final   Vibrio species NOT DETECTED NOT DETECTED Final   Vibrio cholerae NOT DETECTED NOT DETECTED Final   Enteroaggregative E coli (EAEC) NOT DETECTED NOT DETECTED Final   Enteropathogenic E coli (EPEC) NOT DETECTED NOT DETECTED Final   Enterotoxigenic E coli (ETEC) NOT DETECTED NOT DETECTED Final   Shiga like toxin producing E coli (STEC) NOT DETECTED NOT DETECTED Final   Shigella/Enteroinvasive E coli (EIEC) NOT DETECTED NOT DETECTED Final   Cryptosporidium NOT DETECTED NOT DETECTED Final   Cyclospora cayetanensis NOT DETECTED NOT DETECTED Final   Entamoeba histolytica NOT DETECTED NOT DETECTED Final   Giardia lamblia NOT  DETECTED NOT DETECTED Final   Adenovirus F40/41  NOT DETECTED NOT DETECTED Final   Astrovirus NOT DETECTED NOT DETECTED Final   Norovirus GI/GII NOT DETECTED NOT DETECTED Final   Rotavirus A NOT DETECTED NOT DETECTED Final   Sapovirus (I, II, IV, and V) NOT DETECTED NOT DETECTED Final    Comment: Performed at [facility], [address] [place], [place] [address]  Body fluid culture w Gram Stain     Status: None   Collection Time: 06/08/24  1:57 PM   Specimen: Peritoneal Washings; Body Fluid  Result Value Ref Range Status   Specimen Description   Final    PERITONEAL Performed at [facility], [address] [place], [place] [address]    Special Requests   Final    NONE Performed at [facility], [address] [place], [place] [address]    Gram Stain NO WBC SEEN NO ORGANISMS SEEN   Final   Culture   Final    NO GROWTH 3 DAYS Performed at [facility], [address] [place], [place] [address]    Report Status 06/11/2024 FINAL  Final  Culture, blood (Routine X 2) w Reflex to ID Panel     Status: None (Preliminary result)   Collection Time: 06/12/24 10:12 AM   Specimen: BLOOD LEFT HAND  Result Value Ref Range Status   Specimen Description BLOOD LEFT HAND  Final   Special Requests   Final    BOTTLES DRAWN AEROBIC AND ANAEROBIC Blood Culture adequate volume   Culture   Final    NO GROWTH < 24 HOURS Performed at [facility], [address] [place], [place] [address]    Report Status PENDING  Incomplete  Culture, blood (Routine X 2) w Reflex to ID Panel     Status: None (Preliminary result)   Collection Time: 06/12/24 11:46 AM   Specimen: BLOOD  Result Value Ref Range Status   Specimen Description BLOOD BLOOD RIGHT HAND  Final   Special Requests   Final    BOTTLES DRAWN AEROBIC AND ANAEROBIC Blood Culture adequate volume   Culture   Final    NO GROWTH < 24 HOURS Performed at [facility], [address]  [place], [place] [address]    Report Status PENDING  Incomplete    Lab Basic Metabolic Panel: Recent Labs  Lab 06/08/24 0417 06/09/24 0509 06/10/24 0422 06/11/24 0425 06/12/24 0505 06/12/24 2003 07/01/2024 0510  NA 134* 134* 130* 133* 132* 132* 133*  K 5.0 3.9 4.3 4.9 4.1 4.4 5.2*  CL 99 96* 94* 97* 96* 94* 97*  CO2 21* 26 22 22 22  16* 17*  GLUCOSE 110* 117* 88 87 91 123* 79  BUN 76* 43* 66* 80* 47* 56* 56*  CREATININE 3.88* 2.37* 3.74* 4.86* 2.80* 3.50* 3.79*  CALCIUM  8.4* 8.4* 8.4* 8.7* 8.8* 8.4* 8.4*  MG 3.0* 2.1 2.7* 2.9* 2.7*  --  2.9*  PHOS 5.1* 3.5 5.6*  --  4.5  --  6.5*   Liver Function Tests: Recent Labs  Lab 06/10/24 0422 06/11/24 0425 06/12/24 0505 06/12/24 2003 Jul 01, 2024 0510  AST  --  213* 244* 275* 422*  ALT  --  133* 146* 155* 193*  ALKPHOS  --  136* 159* 152* 132*  BILITOT  --  8.5* 11.0* 11.6* 11.8*  PROT  --  5.1* 5.8* 5.2* 5.0*  ALBUMIN  3.1* 3.0* 3.6 2.9* 2.8*   No results for input(s): LIPASE, AMYLASE in the last 168 hours. No results for input(s): AMMONIA  in the last 168 hours. CBC: Recent Labs  Lab 06/09/24 0509 06/10/24 0422 06/11/24 0425 06/12/24 0505 06/18/2024 0510  WBC 22.0* 21.7* 20.5* 26.6* 33.6*  NEUTROABS  --   --  17.5* 23.6*  --   HGB 8.3* 8.0* 8.0* 8.8* 8.8*  HCT 26.1* 25.0* 24.4* 27.3* 28.8*  MCV 100.0 101.2* 98.8 98.6 105.1*  PLT 81* 100* 109* 120* 113*   Cardiac Enzymes: No results for input(s): CKTOTAL, CKMB, CKMBINDEX, TROPONINI in the last 168 hours. Sepsis Labs: Recent Labs  Lab 06/10/24 0422 06/10/24 1015 06/10/24 1947 06/11/24 0425 06/11/24 1443 06/12/24 0505 Jun 18, 2024 0510 2024-06-18 0511  WBC 21.7*  --   --  20.5*  --  26.6* 33.6*  --   LATICACIDVEN  --    < > 3.2* 2.4* 3.4*  --   --  7.9*   < > = values in this interval not displayed.    Belva November, MD [facility] 2024-06-18 8:12 AM

## 2024-06-29 NOTE — Progress Notes (Signed)
   06-20-24 0715  Spiritual Encounters  Type of Visit Attempt (pt unavailable)  Care provided to: Pt not available  Conversation partners present during encounter Nurse  Referral source Nurse (RN/NT/LPN)  Reason for visit End-of-life  OnCall Visit Yes   Nurse stopped Chaplain to share that family is coming in because this patient is beginning to transition.  Nurse asked if Chaplain could come when family is present.  Chaplain said she'd share with colleague and also asked if a Consult or page can happen so the Chaplain team knows when family is there and that they'd like spiritual care.    Rev. Rana M. Nicholaus, M.Div. Chaplain Resident [facility]

## 2024-06-29 NOTE — Progress Notes (Signed)
 Third shift nurse called patients wife- patient declining. Epi ordered and Dana NP will be contacting patients wife.

## 2024-06-29 NOTE — Plan of Care (Signed)
 Notified by CCM that they completed a GOC discussion with family last evening and patient was transitioned to comfort care. Notified that patient died this morning. Chaplain present and supported family. PMT signing off.

## 2024-06-29 DEATH — deceased

## 2024-07-17 ENCOUNTER — Other Ambulatory Visit

## 2024-07-18 ENCOUNTER — Ambulatory Visit: Admitting: Oncology

## 2024-07-18 ENCOUNTER — Ambulatory Visit
